# Patient Record
Sex: Female | Born: 1955 | Race: White | Hispanic: No | Marital: Married | Smoking: Never smoker
Health system: Southern US, Community
[De-identification: ages and names within clinical notes are randomized; demographics above are authoritative.]

## PROBLEM LIST (undated history)

## (undated) DIAGNOSIS — Z9889 Other specified postprocedural states: Secondary | ICD-10-CM

## (undated) DIAGNOSIS — H40009 Preglaucoma, unspecified, unspecified eye: Secondary | ICD-10-CM

## (undated) DIAGNOSIS — I341 Nonrheumatic mitral (valve) prolapse: Secondary | ICD-10-CM

## (undated) DIAGNOSIS — T7840XA Allergy, unspecified, initial encounter: Secondary | ICD-10-CM

## (undated) DIAGNOSIS — F329 Major depressive disorder, single episode, unspecified: Secondary | ICD-10-CM

## (undated) DIAGNOSIS — R51 Headache: Secondary | ICD-10-CM

## (undated) DIAGNOSIS — R519 Headache, unspecified: Secondary | ICD-10-CM

## (undated) DIAGNOSIS — T8859XA Other complications of anesthesia, initial encounter: Secondary | ICD-10-CM

## (undated) DIAGNOSIS — L409 Psoriasis, unspecified: Secondary | ICD-10-CM

## (undated) DIAGNOSIS — G479 Sleep disorder, unspecified: Secondary | ICD-10-CM

## (undated) DIAGNOSIS — Z8489 Family history of other specified conditions: Secondary | ICD-10-CM

## (undated) DIAGNOSIS — I10 Essential (primary) hypertension: Secondary | ICD-10-CM

## (undated) DIAGNOSIS — F32A Depression, unspecified: Secondary | ICD-10-CM

## (undated) DIAGNOSIS — M1990 Unspecified osteoarthritis, unspecified site: Secondary | ICD-10-CM

## (undated) DIAGNOSIS — R42 Dizziness and giddiness: Secondary | ICD-10-CM

## (undated) DIAGNOSIS — L570 Actinic keratosis: Secondary | ICD-10-CM

## (undated) DIAGNOSIS — R000 Tachycardia, unspecified: Secondary | ICD-10-CM

## (undated) DIAGNOSIS — K219 Gastro-esophageal reflux disease without esophagitis: Secondary | ICD-10-CM

## (undated) DIAGNOSIS — M8580 Other specified disorders of bone density and structure, unspecified site: Secondary | ICD-10-CM

## (undated) DIAGNOSIS — Z8719 Personal history of other diseases of the digestive system: Secondary | ICD-10-CM

## (undated) DIAGNOSIS — R112 Nausea with vomiting, unspecified: Secondary | ICD-10-CM

## (undated) DIAGNOSIS — E785 Hyperlipidemia, unspecified: Secondary | ICD-10-CM

## (undated) DIAGNOSIS — K760 Fatty (change of) liver, not elsewhere classified: Secondary | ICD-10-CM

## (undated) DIAGNOSIS — J189 Pneumonia, unspecified organism: Secondary | ICD-10-CM

## (undated) DIAGNOSIS — B029 Zoster without complications: Secondary | ICD-10-CM

## (undated) DIAGNOSIS — M797 Fibromyalgia: Secondary | ICD-10-CM

## (undated) DIAGNOSIS — T4145XA Adverse effect of unspecified anesthetic, initial encounter: Secondary | ICD-10-CM

## (undated) HISTORY — DX: Depression, unspecified: F32.A

## (undated) HISTORY — DX: Actinic keratosis: L57.0

## (undated) HISTORY — PX: COLONOSCOPY: SHX174

## (undated) HISTORY — DX: Psoriasis, unspecified: L40.9

## (undated) HISTORY — DX: Sleep disorder, unspecified: G47.9

## (undated) HISTORY — DX: Preglaucoma, unspecified, unspecified eye: H40.009

## (undated) HISTORY — PX: CARPAL TUNNEL RELEASE: SHX101

## (undated) HISTORY — DX: Tachycardia, unspecified: R00.0

## (undated) HISTORY — PX: SHOULDER SURGERY: SHX246

## (undated) HISTORY — PX: CARDIAC CATHETERIZATION: SHX172

## (undated) HISTORY — DX: Nonrheumatic mitral (valve) prolapse: I34.1

## (undated) HISTORY — DX: Major depressive disorder, single episode, unspecified: F32.9

## (undated) HISTORY — DX: Other specified disorders of bone density and structure, unspecified site: M85.80

## (undated) HISTORY — PX: BLADDER REPAIR: SHX76

## (undated) HISTORY — DX: Allergy, unspecified, initial encounter: T78.40XA

## (undated) HISTORY — PX: RECTAL PROLAPSE REPAIR: SHX759

## (undated) HISTORY — DX: Hyperlipidemia, unspecified: E78.5

## (undated) HISTORY — PX: UPPER GI ENDOSCOPY: SHX6162

## (undated) HISTORY — DX: Gastro-esophageal reflux disease without esophagitis: K21.9

## (undated) HISTORY — DX: Zoster without complications: B02.9

## (undated) HISTORY — DX: Essential (primary) hypertension: I10

## (undated) HISTORY — DX: Fibromyalgia: M79.7

## (undated) HISTORY — PX: JOINT REPLACEMENT: SHX530

---
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
## 1979-11-21 HISTORY — PX: AUGMENTATION MAMMAPLASTY: SUR837

## 1985-11-20 HISTORY — PX: BREAST ENHANCEMENT SURGERY: SHX7

## 1998-11-20 HISTORY — PX: ABDOMINAL HYSTERECTOMY: SHX81

## 2001-04-24 ENCOUNTER — Ambulatory Visit (HOSPITAL_COMMUNITY): Admission: RE | Admit: 2001-04-24 | Discharge: 2001-04-24 | Payer: Self-pay | Admitting: *Deleted

## 2004-09-20 ENCOUNTER — Encounter: Payer: Self-pay | Admitting: Internal Medicine

## 2005-03-18 ENCOUNTER — Observation Stay: Payer: Self-pay | Admitting: Specialist

## 2005-03-20 HISTORY — PX: NM MYOVIEW LTD: HXRAD82

## 2005-04-20 ENCOUNTER — Ambulatory Visit: Payer: Self-pay | Admitting: Endocrinology

## 2005-11-10 ENCOUNTER — Emergency Department: Payer: Self-pay | Admitting: Emergency Medicine

## 2005-12-15 ENCOUNTER — Other Ambulatory Visit: Payer: Self-pay

## 2005-12-15 ENCOUNTER — Emergency Department: Payer: Self-pay | Admitting: General Practice

## 2006-04-06 ENCOUNTER — Other Ambulatory Visit: Payer: Self-pay

## 2006-04-06 ENCOUNTER — Emergency Department: Payer: Self-pay | Admitting: Emergency Medicine

## 2006-04-27 ENCOUNTER — Ambulatory Visit: Payer: Self-pay | Admitting: Otolaryngology

## 2006-05-11 ENCOUNTER — Encounter: Payer: Self-pay | Admitting: Internal Medicine

## 2006-09-20 ENCOUNTER — Ambulatory Visit: Payer: Self-pay

## 2006-11-20 DIAGNOSIS — B029 Zoster without complications: Secondary | ICD-10-CM

## 2006-11-20 HISTORY — DX: Zoster without complications: B02.9

## 2006-12-18 ENCOUNTER — Ambulatory Visit: Payer: Self-pay | Admitting: Internal Medicine

## 2007-01-17 ENCOUNTER — Ambulatory Visit: Payer: Self-pay | Admitting: Internal Medicine

## 2007-02-18 ENCOUNTER — Ambulatory Visit: Payer: Self-pay | Admitting: Internal Medicine

## 2007-03-20 ENCOUNTER — Ambulatory Visit: Payer: Self-pay | Admitting: Internal Medicine

## 2007-03-20 DIAGNOSIS — J309 Allergic rhinitis, unspecified: Secondary | ICD-10-CM | POA: Insufficient documentation

## 2007-03-20 DIAGNOSIS — G479 Sleep disorder, unspecified: Secondary | ICD-10-CM | POA: Insufficient documentation

## 2007-03-20 DIAGNOSIS — J310 Chronic rhinitis: Secondary | ICD-10-CM | POA: Insufficient documentation

## 2007-03-20 DIAGNOSIS — K219 Gastro-esophageal reflux disease without esophagitis: Secondary | ICD-10-CM | POA: Insufficient documentation

## 2007-03-20 LAB — CONVERTED CEMR LAB
Albumin: 3.6 g/dL (ref 3.5–5.2)
BUN: 15 mg/dL (ref 6–23)
GFR calc non Af Amer: 81 mL/min
Phosphorus: 3.1 mg/dL (ref 2.3–4.6)
Potassium: 4.5 meq/L (ref 3.5–5.1)
Sodium: 139 meq/L (ref 135–145)

## 2007-03-25 ENCOUNTER — Telehealth (INDEPENDENT_AMBULATORY_CARE_PROVIDER_SITE_OTHER): Payer: Self-pay | Admitting: *Deleted

## 2007-04-06 ENCOUNTER — Emergency Department: Payer: Self-pay | Admitting: Emergency Medicine

## 2007-04-06 ENCOUNTER — Other Ambulatory Visit: Payer: Self-pay

## 2007-04-16 ENCOUNTER — Telehealth (INDEPENDENT_AMBULATORY_CARE_PROVIDER_SITE_OTHER): Payer: Self-pay | Admitting: *Deleted

## 2007-05-30 ENCOUNTER — Encounter: Payer: Self-pay | Admitting: Internal Medicine

## 2007-05-30 DIAGNOSIS — J45909 Unspecified asthma, uncomplicated: Secondary | ICD-10-CM | POA: Insufficient documentation

## 2007-05-30 DIAGNOSIS — I059 Rheumatic mitral valve disease, unspecified: Secondary | ICD-10-CM | POA: Insufficient documentation

## 2007-05-30 DIAGNOSIS — H409 Unspecified glaucoma: Secondary | ICD-10-CM | POA: Insufficient documentation

## 2007-05-30 DIAGNOSIS — M949 Disorder of cartilage, unspecified: Secondary | ICD-10-CM

## 2007-05-30 DIAGNOSIS — I10 Essential (primary) hypertension: Secondary | ICD-10-CM | POA: Insufficient documentation

## 2007-05-30 DIAGNOSIS — M899 Disorder of bone, unspecified: Secondary | ICD-10-CM | POA: Insufficient documentation

## 2007-06-06 ENCOUNTER — Ambulatory Visit: Payer: Self-pay | Admitting: Internal Medicine

## 2007-06-12 ENCOUNTER — Ambulatory Visit: Payer: Self-pay | Admitting: Internal Medicine

## 2007-06-12 DIAGNOSIS — N6019 Diffuse cystic mastopathy of unspecified breast: Secondary | ICD-10-CM | POA: Insufficient documentation

## 2007-06-26 ENCOUNTER — Encounter: Payer: Self-pay | Admitting: Internal Medicine

## 2007-07-01 ENCOUNTER — Encounter (INDEPENDENT_AMBULATORY_CARE_PROVIDER_SITE_OTHER): Payer: Self-pay | Admitting: *Deleted

## 2007-07-21 ENCOUNTER — Emergency Department: Payer: Self-pay | Admitting: Emergency Medicine

## 2007-09-23 ENCOUNTER — Ambulatory Visit: Payer: Self-pay | Admitting: Internal Medicine

## 2007-09-23 ENCOUNTER — Encounter (INDEPENDENT_AMBULATORY_CARE_PROVIDER_SITE_OTHER): Payer: Self-pay | Admitting: *Deleted

## 2007-10-25 ENCOUNTER — Telehealth: Payer: Self-pay | Admitting: Internal Medicine

## 2007-11-05 ENCOUNTER — Encounter: Payer: Self-pay | Admitting: Internal Medicine

## 2007-11-27 ENCOUNTER — Ambulatory Visit: Payer: Self-pay | Admitting: Internal Medicine

## 2008-02-19 ENCOUNTER — Ambulatory Visit: Payer: Self-pay | Admitting: Internal Medicine

## 2008-02-20 LAB — CONVERTED CEMR LAB
Albumin: 3.9 g/dL (ref 3.5–5.2)
CO2: 31 meq/L (ref 19–32)
Chloride: 104 meq/L (ref 96–112)
Creatinine, Ser: 0.8 mg/dL (ref 0.4–1.2)
GFR calc Af Amer: 98 mL/min
GFR calc non Af Amer: 81 mL/min
Glucose, Bld: 92 mg/dL (ref 70–99)
Sodium: 142 meq/L (ref 135–145)

## 2008-04-22 ENCOUNTER — Ambulatory Visit: Payer: Self-pay | Admitting: Internal Medicine

## 2008-05-01 ENCOUNTER — Encounter: Payer: Self-pay | Admitting: Internal Medicine

## 2008-08-14 ENCOUNTER — Ambulatory Visit: Payer: Self-pay | Admitting: Orthopedic Surgery

## 2008-08-28 ENCOUNTER — Ambulatory Visit: Payer: Self-pay | Admitting: Internal Medicine

## 2008-09-16 ENCOUNTER — Ambulatory Visit: Payer: Self-pay | Admitting: Orthopedic Surgery

## 2008-09-22 ENCOUNTER — Ambulatory Visit: Payer: Self-pay | Admitting: Orthopedic Surgery

## 2008-12-28 ENCOUNTER — Telehealth: Payer: Self-pay | Admitting: Internal Medicine

## 2009-01-27 ENCOUNTER — Ambulatory Visit: Payer: Self-pay | Admitting: Orthopedic Surgery

## 2009-02-02 ENCOUNTER — Ambulatory Visit: Payer: Self-pay | Admitting: Orthopedic Surgery

## 2009-02-23 ENCOUNTER — Ambulatory Visit: Payer: Self-pay | Admitting: Internal Medicine

## 2009-02-24 LAB — CONVERTED CEMR LAB
AST: 23 units/L (ref 0–37)
Albumin: 3.9 g/dL (ref 3.5–5.2)
Alkaline Phosphatase: 86 units/L (ref 39–117)
Basophils Absolute: 0 10*3/uL (ref 0.0–0.1)
Calcium: 9.7 mg/dL (ref 8.4–10.5)
Eosinophils Absolute: 0.1 10*3/uL (ref 0.0–0.7)
Eosinophils Relative: 1.5 % (ref 0.0–5.0)
GFR calc non Af Amer: 70.15 mL/min (ref >60)
Glucose, Bld: 99 mg/dL (ref 70–99)
HDL: 55.8 mg/dL (ref >39.00)
Monocytes Relative: 9.4 % (ref 3.0–12.0)
Neutro Abs: 5 10*3/uL (ref 1.4–7.7)
Neutrophils Relative %: 62 % (ref 43.0–77.0)
Platelets: 269 10*3/uL (ref 150.0–400.0)
RBC: 4.54 M/uL (ref 3.87–5.11)
Sodium: 141 meq/L (ref 135–145)
TSH: 0.73 microintl units/mL (ref 0.35–5.50)
Total Bilirubin: 0.6 mg/dL (ref 0.3–1.2)
Total CHOL/HDL Ratio: 3
VLDL: 21.8 mg/dL (ref 0.0–40.0)
WBC: 8 10*3/uL (ref 4.5–10.5)

## 2009-03-15 ENCOUNTER — Encounter: Payer: Self-pay | Admitting: Internal Medicine

## 2009-03-18 ENCOUNTER — Encounter: Payer: Self-pay | Admitting: Internal Medicine

## 2009-05-22 ENCOUNTER — Emergency Department: Payer: Self-pay | Admitting: Emergency Medicine

## 2009-06-01 ENCOUNTER — Ambulatory Visit: Payer: Self-pay | Admitting: Family Medicine

## 2009-06-03 ENCOUNTER — Telehealth: Payer: Self-pay | Admitting: Internal Medicine

## 2009-07-01 ENCOUNTER — Telehealth: Payer: Self-pay | Admitting: Internal Medicine

## 2009-08-24 ENCOUNTER — Encounter: Payer: Self-pay | Admitting: Internal Medicine

## 2009-09-09 ENCOUNTER — Ambulatory Visit: Payer: Self-pay | Admitting: Internal Medicine

## 2009-09-18 ENCOUNTER — Observation Stay: Payer: Self-pay | Admitting: Internal Medicine

## 2009-09-18 ENCOUNTER — Encounter: Payer: Self-pay | Admitting: Internal Medicine

## 2009-09-20 ENCOUNTER — Ambulatory Visit: Payer: Self-pay | Admitting: Obstetrics and Gynecology

## 2009-09-21 ENCOUNTER — Encounter: Payer: Self-pay | Admitting: Internal Medicine

## 2009-09-21 ENCOUNTER — Ambulatory Visit: Payer: Self-pay | Admitting: Unknown Physician Specialty

## 2009-09-24 ENCOUNTER — Ambulatory Visit: Payer: Self-pay | Admitting: Obstetrics and Gynecology

## 2009-10-18 ENCOUNTER — Encounter: Payer: Self-pay | Admitting: Internal Medicine

## 2009-10-29 ENCOUNTER — Encounter: Payer: Self-pay | Admitting: Internal Medicine

## 2009-11-10 ENCOUNTER — Ambulatory Visit: Payer: Self-pay

## 2009-11-20 HISTORY — PX: CERVICAL DISCECTOMY: SHX98

## 2009-12-16 ENCOUNTER — Ambulatory Visit (HOSPITAL_COMMUNITY): Admission: RE | Admit: 2009-12-16 | Discharge: 2009-12-20 | Payer: Self-pay | Admitting: Neurosurgery

## 2010-02-02 ENCOUNTER — Ambulatory Visit: Payer: Self-pay | Admitting: Internal Medicine

## 2010-02-23 ENCOUNTER — Encounter: Payer: Self-pay | Admitting: Internal Medicine

## 2010-02-25 ENCOUNTER — Ambulatory Visit: Payer: Self-pay | Admitting: Internal Medicine

## 2010-04-29 ENCOUNTER — Ambulatory Visit: Payer: Self-pay | Admitting: Internal Medicine

## 2010-06-01 ENCOUNTER — Encounter (INDEPENDENT_AMBULATORY_CARE_PROVIDER_SITE_OTHER): Payer: Self-pay | Admitting: *Deleted

## 2010-09-05 ENCOUNTER — Ambulatory Visit: Payer: Self-pay | Admitting: Internal Medicine

## 2010-09-05 ENCOUNTER — Encounter: Payer: Self-pay | Admitting: Internal Medicine

## 2010-09-07 LAB — CONVERTED CEMR LAB
AST: 45 units/L — ABNORMAL HIGH (ref 0–37)
Alkaline Phosphatase: 103 units/L (ref 39–117)
BUN: 16 mg/dL (ref 6–23)
Basophils Relative: 0.4 % (ref 0.0–3.0)
Bilirubin, Direct: 0.1 mg/dL (ref 0.0–0.3)
CO2: 30 meq/L (ref 19–32)
Calcium: 10 mg/dL (ref 8.4–10.5)
Cholesterol: 217 mg/dL — ABNORMAL HIGH (ref 0–200)
Eosinophils Relative: 1.7 % (ref 0.0–5.0)
GFR calc non Af Amer: 70.63 mL/min (ref >60)
HCT: 40.8 % (ref 36.0–46.0)
Hemoglobin: 14.2 g/dL (ref 12.0–15.0)
Lymphs Abs: 2.2 10*3/uL (ref 0.7–4.0)
Monocytes Relative: 10.5 % (ref 3.0–12.0)
Neutro Abs: 4.1 10*3/uL (ref 1.4–7.7)
RBC: 4.59 M/uL (ref 3.87–5.11)
RDW: 12.9 % (ref 11.5–14.6)
TSH: 0.72 microintl units/mL (ref 0.35–5.50)
Total Protein: 7.2 g/dL (ref 6.0–8.3)
Triglycerides: 162 mg/dL — ABNORMAL HIGH (ref 0.0–149.0)

## 2010-10-01 ENCOUNTER — Ambulatory Visit: Payer: Self-pay | Admitting: Family Medicine

## 2010-10-09 ENCOUNTER — Ambulatory Visit: Payer: Self-pay | Admitting: Internal Medicine

## 2010-10-12 ENCOUNTER — Ambulatory Visit: Payer: Self-pay | Admitting: Family Medicine

## 2010-12-18 LAB — CONVERTED CEMR LAB
ALT: 18 units/L (ref 0–35)
ANA Titer 1: 1:640 {titer} — ABNORMAL HIGH
Alkaline Phosphatase: 59 units/L (ref 39–117)
Anti Nuclear Antibody(ANA): POSITIVE — AB
BUN: 17 mg/dL (ref 6–23)
Basophils Absolute: 0 10*3/uL (ref 0.0–0.1)
Basophils Absolute: 0 10*3/uL (ref 0.0–0.1)
Bilirubin, Direct: 0.1 mg/dL (ref 0.0–0.3)
CO2: 28 meq/L (ref 19–32)
Calcium: 9.4 mg/dL (ref 8.4–10.5)
Chloride: 104 meq/L (ref 96–112)
Creatinine, Ser: 0.9 mg/dL (ref 0.4–1.2)
Direct LDL: 122.7 mg/dL
Eosinophils Absolute: 0.1 10*3/uL (ref 0.0–0.6)
Eosinophils Absolute: 0.1 10*3/uL (ref 0.0–0.6)
Eosinophils Relative: 1.4 % (ref 0.0–5.0)
GFR calc Af Amer: 115 mL/min
GFR calc Af Amer: 86 mL/min
Glucose, Bld: 81 mg/dL (ref 70–99)
Glucose, Bld: 91 mg/dL (ref 70–99)
HCT: 36.1 % (ref 36.0–46.0)
HCT: 37.1 % (ref 36.0–46.0)
HDL: 62.9 mg/dL (ref >39.0)
Hemoglobin: 13.3 g/dL (ref 12.0–15.0)
Lymphocytes Relative: 29.1 % (ref 12.0–46.0)
MCHC: 34.5 g/dL (ref 30.0–36.0)
MCHC: 35.9 g/dL (ref 30.0–36.0)
MCV: 86.6 fL (ref 78.0–100.0)
Monocytes Absolute: 0.7 10*3/uL (ref 0.2–0.7)
Neutro Abs: 4.3 10*3/uL (ref 1.4–7.7)
Neutrophils Relative %: 52.5 % (ref 43.0–77.0)
Neutrophils Relative %: 60.5 % (ref 43.0–77.0)
Platelets: 268 10*3/uL (ref 150–400)
Potassium: 3.9 meq/L (ref 3.5–5.1)
Potassium: 4.4 meq/L (ref 3.5–5.1)
RBC: 4.17 M/uL (ref 3.87–5.11)
RDW: 12.7 % (ref 11.5–14.6)
Rhuematoid fact SerPl-aCnc: <20 intl units/mL — ABNORMAL LOW (ref 0.0–20.0)
Sodium: 139 meq/L (ref 135–145)
TSH: 0.7 microintl units/mL (ref 0.35–5.50)
TSH: 0.88 microintl units/mL (ref 0.35–5.50)
Total CHOL/HDL Ratio: 3.4
Total Protein: 6.9 g/dL (ref 6.0–8.3)
Triglycerides: 165 mg/dL — ABNORMAL HIGH (ref 0–149)
WBC: 5.5 10*3/uL (ref 4.5–10.5)

## 2010-12-22 NOTE — Assessment & Plan Note (Signed)
Summary: CONGESTION,COUGH AND TIGHTNESS IN CHEST/EVM   Vital Signs:  Patient profile:   56 year old female Height:      64 inches Weight:      172 pounds O2 Sat:      98 % on Room air Temp:     98.1 degrees F oral Pulse rate:   83 / minute Pulse rhythm:   regular Resp:     16 per minute BP sitting:   132 / 86  (left arm)  Vitals Entered By: Ma Hillock LPN CC: Cough and congestion slowly worse last few days as sinus symptoms improved. Is Patient Diabetic? No Pain Assessment Patient in pain? yes     Location: head, chest Intensity: 4 Type: dull, tightness Onset of pain  Constant  Does patient need assistance? Functional Status Self care Ambulation Normal   CC:  Cough and congestion slowly worse last few days as sinus symptoms improved.Marland Kitchen  History of Present Illness: patient here 10/01/10 for dx sinusitis and rx amox 875, flonase, cont asthma meds and mucinex d. She had just previously had 10 days of cipro.  worse symptoms now is yellow productive cough, dyspnea on exertion, perhaps unmeasured low grade fever. patient  denies chest pain, chills, myalgias, sob at rest, wheezing severe enough to take her as needed albuterol. patient had flu shot this year Dr. Alphonsus Sias. patient with hx allergies but not taking any antihistamine. Has bottle of tussionex but hasn't needed it. She can take zpak.  Problems Prior to Update: 1)  Acute Sinusitis, Unspecified  (ICD-461.9) 2)  Palpitations  (ICD-785.1) 3)  Actinic Keratosis  (ICD-702.0) 4)  Depression  (ICD-311) 5)  Toe Injury, Traumatic Nail Excision  (ICD-959.7) 6)  Special Screening For Malignant Neoplasms Colon  (ICD-V76.51) 7)  Leg Pain, Right  (ICD-729.5) 8)  Chronic Rhinitis  (ICD-472.0) 9)  Preventive Health Care  (ICD-V70.0) 10)  Chest Pain  (ICD-786.50) 11)  Back, Lower, Pain  (ICD-724.2) 12)  Fibrocystic Breast Disease  (ICD-610.1) 13)  Glaucoma  (ICD-365.9) 14)  Osteopenia  (ICD-733.90) 15)  Mitral Valve Prolapse   (ICD-424.0) 16)  Allergic Rhinitis  (ICD-477.9) 17)  Sleep Disorder  (ICD-780.50) 18)  Hypertension  (ICD-401.9) 19)  Gerd  (ICD-530.81) 20)  Asthma  (ICD-493.90)  Medications Prior to Update: 1)  Advair Diskus 100-50 Mcg/dose Misc (Fluticasone-Salmeterol) .Marland Kitchen.. 1 Inhalation Once A Day 2)  Protonix 40 Mg Tbec (Pantoprazole Sodium) .... Take 1 By Mouth Once Daily 3)  Trazodone Hcl 100 Mg Tabs (Trazodone Hcl) .... Take 2 By Mouth At Bedtime 4)  Triamterene-Hctz 37.5-25 Mg Tabs (Triamterene-Hctz) .... Take One By Mouth Daily 5)  Lumigan 0.03 %  Soln (Bimatoprost) .... As Directed 6)  Metoprolol Succinate 25 Mg Xr24h-Tab (Metoprolol Succinate) .... 1/2 Tab Daily As Directed 7)  Amoxicillin 875 Mg Tabs (Amoxicillin) .... Take 1 By Mouth Two Times A Day Until Completed 8)  Fluticasone Propionate 50 Mcg/act Susp (Fluticasone Propionate) .... 2 Sprays Per Nostril Once Daily- Use in Place of The Nasocort  Allergies: 1)  ! Macrobid (Nitrofurantoin Monohyd Macro) 2)  ! Percocet (Oxycodone-Acetaminophen) 3)  ! Biaxin 4)  ! * Cat and Dog 5)  ! * Dust 6)  Lisinopril (Lisinopril)  Past History:  Past Medical History: Last updated: 10/01/2010 Asthma GERD Hypertension Osteopenia Mitral valve prolapse Glaucoma suspect Sleep disturbance Allergic Rhinitis Depression  Past Surgical History: Last updated: 02/02/2010 1/08     Shingles 2000    Hysterectomy/BSO - abn. Paps 1978    Appendix  1987    Breast augmentation Since hysterectomy   - Bladder tack x 2 and rectal repair x 1 2002    Cardiac Cath - okay 6/07     DEXA - boderline (femoral neck  1-2) 5/06     Myoview Stress - EF 56%, no ischemia 11/07/23/09  Right shoulder surgery, then redone with lysis of adhesions 10/10  Admitted with chest pain---myoview stress was normal 1/11 Cervical diskectomy with synthetic discs inserted--  Dr Lovell Sheehan  Family History: Last updated: 05/30/2007 Dad ded @70  lung cancer, CAD Mom has DM, CVA, HTN,  ovarian cancer, melanoma 1 sister--CAD, DM, dperession HTN strong in family RA in maternal aunt Breast cancer in [place] DM in sister, Mom, grandparents and great grandparents No colon cancer  Social History: Last updated: 05/30/2007 Married--5 children Never Smoked Alcohol use-no 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 Eyes:  Denies discharge, eye irritation, and eye pain. ENT:  Complains of hoarseness, sinus pressure, and sore throat. CV:  Denies chest pain or discomfort, fainting, near fainting, swelling of feet, and swelling of hands. Resp:  Complains of chest discomfort, cough, sputum productive, and wheezing; Asthma. GI:  Denies abdominal pain, bloody stools, constipation, diarrhea, indigestion, nausea, and vomiting. GU:  Denies abnormal vaginal bleeding, discharge, and dysuria. MS:  Denies joint pain, joint redness, joint swelling, loss of strength, muscle aches, muscle weakness, and stiffness. Derm:  Denies changes in color of skin, changes in nail beds, hair loss, and rash. Neuro:  Complains of headaches; denies brief paralysis, numbness, tingling, tremors, and weakness; mild bitemporal not like usual migraines or previous sinus pain. Psych:  Denies anxiety, depression, and easily angered. Heme:  Denies abnormal bruising.  Other ROS:  General: Denies fever, chills, sweats, anorexia, fatigue, weakness, malaise, weight loss, and sleep disorder.   Physical Exam  General:  Well-developed,well-nourished,in no acute distress; alert,appropriate and cooperative throughout examination Head:  Normocephalic and atraumatic without obvious abnormalities. Eyes:  No corneal or conjunctival inflammation noted. EOMI.  Perrla. Ears:  External ear exam shows no significant lesions or deformities.  Otoscopic examination reveals clear canals, tympanic membranes are intact bilaterally without bulging, retraction, inflammation or discharge. Hearing is grossly normal bilaterally. Nose:  External nasal examination shows no deformity or inflammation. Nasal mucosa are pink and moist without lesions or exudates. Mouth:  Oral mucosa and oropharynx without lesions or exudates Neck:  No deformities, masses, or tenderness noted. Lungs:  Normal respiratory effort, chest expands symmetrically. Lungs are clear to auscultation, no crackles or wheezes. Msk:    Extremities:  No clubbing, cyanosis, edema,  Neurologic:  No cranial nerve deficits noted. Station and gait are normal. motor and coordinative functions appear intact. Cervical Nodes:  No lymphadenopathy noted Psych:  Cognition and judgment appear intact. Alert and cooperative with normal attention span and concentration. No apparent delusions, illusions, hallucinations   Problems:  Medical Problems Added: 1)  Dx of Bronchitis-acute  (ICD-466.0)  Impression & Recommendations:  Problem # 1:  ACUTE SINUSITIS, UNSPECIFIED (ICD-461.9) Assessment Improved  Her updated medication list for this problem includes:    Amoxicillin 875 Mg Tabs (Amoxicillin) .Marland Kitchen... Take 1 by mouth two times a day until completed    Fluticasone Propionate 50 Mcg/act Susp (Fluticasone propionate) .Marland Kitchen... 2 sprays per nostril once daily- use in  place of the nasocort    Zithromax Z-pak 250 Mg Tabs (Azithromycin) .Marland Kitchen... 2 by mouth then 1 by mouth qd  Problem # 2:  ASTHMA (ICD-493.90) Assessment: Unchanged  Her updated medication list for this problem includes:    Advair Diskus 100-50 Mcg/dose Misc (Fluticasone-salmeterol) .Marland Kitchen... 1 inhalation once a day  Complete Medication List: 1)  Advair Diskus 100-50 Mcg/dose Misc (Fluticasone-salmeterol) .Marland Kitchen.. 1 inhalation once a day 2)  Protonix 40 Mg Tbec  (Pantoprazole sodium) .... Take 1 by mouth once daily 3)  Trazodone Hcl 100 Mg Tabs (Trazodone hcl) .... Take 2 by mouth at bedtime 4)  Triamterene-hctz 37.5-25 Mg Tabs (Triamterene-hctz) .... Take one by mouth daily 5)  Lumigan 0.03 % Soln (Bimatoprost) .... As directed 6)  Metoprolol Succinate 25 Mg Xr24h-tab (Metoprolol succinate) .... 1/2 tab daily as directed 7)  Amoxicillin 875 Mg Tabs (Amoxicillin) .... Take 1 by mouth two times a day until completed 8)  Fluticasone Propionate 50 Mcg/act Susp (Fluticasone propionate) .... 2 sprays per nostril once daily- use in place of the nasocort 9)  Zithromax Z-pak 250 Mg Tabs (Azithromycin) .... 2 by mouth then 1 by mouth qd  Patient Instructions: 1)  Take 650-1000mg  of Tylenol every 4-6 hours as needed for relief of pain or comfort of fever AVOID taking more than 4000mg   in a 24 hour period (can cause liver damage in higher doses). 2)  Oral Rehydration Solution: drink 1/2 ounce every 15 minutes. If tolerated afert 1 hour, drink 1 ounce every 15 minutes. As you can tolerate, keep adding 1/2 ounce every 15 minutes, up to a total of 2-4 ounces. Contact the office if unable to tolerate oral solution, if you keep vomiting, or you continue to have signs of dehydration. 3)  Please schedule an appointment with your primary doctor in :1 week. 4)  Continue present meds and consider using your albuterol if wheezy. 5)  consider restarting claritin. 6)  use robitussin dm for mild cough suppression and if severe cough use you own tussionex. Prescriptions: ZITHROMAX Z-PAK 250 MG TABS (AZITHROMYCIN) 2 by mouth then 1 by mouth qd  #1 x 0   Entered and Authorized by:   J. Juline Patch MD   Signed by:   Shela Commons. Juline Patch MD on 10/09/2010   Method used:   Electronically to        [facility] [address] (retail)       [address]       [place], [place]  [address]       Ph: [phone]       Fax: [phone]   RxID:    [id number]

## 2010-12-22 NOTE — Assessment & Plan Note (Signed)
Summary: PALPITATIONS,FLU SHOT/CLE   Vital Signs:  Patient profile:   55 year old female Height:      67 inches Weight:      172.0 pounds BMI:     27.04 Temp:     98.9 degrees F oral Pulse rate:   76 / minute Pulse rhythm:   regular BP sitting:   110 / 80  (left arm) Cuff size:   regular  Vitals Entered By: Mervin Hack CMA Duncan Dull) (September 05, 2010 11:26 AM) CC: palpatations   History of Present Illness: Had some palpitations these come and go Has had low potassium in past and wonders if that is the reason tries to increase bananas, etc May occur every few weeks to couple of months more at night No chest pain No SOB Not during exertion Feels skips chronic fast heart beat in general  Stopped the citalopram completely weaned off in July No problems with mood    Allergies: 1)  ! Macrobid (Nitrofurantoin Monohyd Macro) 2)  ! Percocet (Oxycodone-Acetaminophen) 3)  Lisinopril (Lisinopril)  Past History:  Past medical, surgical, family and social histories (including risk factors) reviewed for relevance to current acute and chronic problems.  Past Medical History: Reviewed history from 02/02/2010 and no changes required. Asthma GERD Hypertension Osteopenia Mitral valve prolapse Glaucoma suspect Sleep disturbance Allergies Depression  Past Surgical History: Reviewed history from 02/02/2010 and no changes required. 1/08     Shingles 2000    Hysterectomy/BSO - abn. Paps 1978    Appendix 1987    Breast augmentation Since hysterectomy   - Bladder tack x 2 and rectal repair x 1 2002    Cardiac Cath - okay 6/07     DEXA - boderline (femoral neck  1-2) 5/06     Myoview Stress - EF 56%, no ischemia 11/07/23/09  Right shoulder surgery, then redone with lysis of adhesions 10/10  Admitted with chest pain---myoview stress was normal 1/11 Cervical diskectomy with synthetic discs inserted--  Dr Lovell Sheehan  Family History: Reviewed history from 05/30/2007 and no  changes required. Dad ded @70  lung cancer, CAD Mom has DM, CVA, HTN, ovarian cancer, melanoma 1 sister--CAD, DM, dperession HTN strong in family RA in maternal aunt Breast cancer in [place] DM in sister, Mom, grandparents and great grandparents No colon cancer  Social History: Reviewed history from 05/30/2007 and no changes required. Married--5 children Never Smoked Alcohol use-no Home maker Doesn't exercise  Cordelia Pen Talley's sister  Review of Systems       No sig caffeine--2 cups of coffee in Am only sleeps okay Needs the diuretic due to recurrent hand swelling  Physical Exam  General:  alert and normal appearance.   Neck:  supple, no masses, no thyromegaly, no carotid bruits, and no cervical lymphadenopathy.   Lungs:  normal respiratory effort, no intercostal retractions, no accessory muscle use, and normal breath sounds.   Heart:  normal rate, regular rhythm, no murmur, and no gallop.   Abdomen:  soft and non-tender.   Pulses:  1+ in feet Extremities:  no edema Psych:  normally interactive, good eye contact, not anxious appearing, and not depressed appearing.     Impression & Recommendations:  Problem # 1:  PALPITATIONS (ICD-785.1) Assessment New  doesn't sound pathologic but has been bothersome to her at times will try low dose metoprolol she will call if she has persistent symptoms or dizziness, etc  Her updated medication list for this problem includes:    Metoprolol Succinate 25 Mg Xr24h-tab (  Metoprolol succinate) .Marland Kitchen... 1/2 tab daily as directed  Orders: EKG w/ Interpretation (93000) TLB-Lipid Panel (80061-LIPID) TLB-Renal Function Panel (80069-RENAL) TLB-CBC Platelet - w/Differential (85025-CBCD) TLB-Hepatic/Liver Function Pnl (80076-HEPATIC) TLB-TSH (Thyroid Stimulating Hormone) (84443-TSH) Venipuncture (47829)  Problem # 2:  HYPERTENSION (ICD-401.9) Assessment: Unchanged good control needs the diuretic for chronic hand swelling asked her to cut  out all salt  Her updated medication list for this problem includes:    Triamterene-hctz 37.5-25 Mg Tabs (Triamterene-hctz) .Marland Kitchen... Take one by mouth daily    Metoprolol Succinate 25 Mg Xr24h-tab (Metoprolol succinate) .Marland Kitchen... 1/2 tab daily as directed  BP today: 110/80 Prior BP: 110/68 (04/29/2010)  Labs Reviewed: K+: 4.0 (02/23/2009) Creat: : 0.9 (02/23/2009)   Chol: 194 (02/23/2009)   HDL: 55.80 (02/23/2009)   LDL: 116 (02/23/2009)   TG: 109.0 (02/23/2009)  Complete Medication List: 1)  Advair Diskus 100-50 Mcg/dose Misc (Fluticasone-salmeterol) .Marland Kitchen.. 1 inhalation once a day 2)  Protonix 40 Mg Tbec (Pantoprazole sodium) .... Take 1 by mouth once daily 3)  Trazodone Hcl 100 Mg Tabs (Trazodone hcl) .... Take 2 by mouth at bedtime 4)  Triamterene-hctz 37.5-25 Mg Tabs (Triamterene-hctz) .... Take one by mouth daily 5)  Nasacort Aq 55 Mcg/act Aers (Triamcinolone acetonide(nasal)) .... 2 sprays each nostril daily as directed 6)  Lumigan 0.03 % Soln (Bimatoprost) .... As directed 7)  Metoprolol Succinate 25 Mg Xr24h-tab (Metoprolol succinate) .... 1/2 tab daily as directed  Other Orders: Admin 1st Vaccine (56213) Flu Vaccine 41yrs + [id number])  Patient Instructions: 1)  Please start the new medication, metoprolol, for the palpitations 2)  Please stop all salt and use salt substitute instead 3)  If your hand swelling is better, it is okay to try taking the fluid pill only as needed or cutting them in half 4)  Please cancel the December visit----schedule physical in 4-6 months Prescriptions: METOPROLOL SUCCINATE 25 MG XR24H-TAB (METOPROLOL SUCCINATE) 1/2 tab daily as directed  #30 x 5   Entered and Authorized by:   Cindee Salt MD   Signed by:   Cindee Salt MD on 09/05/2010   Method used:   Electronically to        [facility] (retail)       [address]       [place], [place]  [address]       Ph: [phone]       Fax:  [phone]   RxID:   [id number]    Orders Added: 1)  Admin 1st Vaccine [90471] 2)  Flu Vaccine 63yrs + [56433] 3)  Est. Patient Level IV [29518] 4)  EKG w/ Interpretation [93000] 5)  TLB-Lipid Panel [80061-LIPID] 6)  TLB-Renal Function Panel [80069-RENAL] 7)  TLB-CBC Platelet - w/Differential [85025-CBCD] 8)  TLB-Hepatic/Liver Function Pnl [80076-HEPATIC] 9)  TLB-TSH (Thyroid Stimulating Hormone) [84443-TSH] 10)  Venipuncture [84166]    Current Allergies (reviewed today): ! MACROBID (NITROFURANTOIN MONOHYD MACRO) ! PERCOCET (OXYCODONE-ACETAMINOPHEN) LISINOPRIL (LISINOPRIL)              Flu Vaccine Consent Questions     Do you have a history of severe allergic reactions to this vaccine? no    Any prior history of allergic reactions to egg and/or gelatin? no    Do you have a sensitivity to the preservative Thimersol? no    Do you have a past history of Guillan-Barre Syndrome? no    Do you currently have an acute febrile illness?  no    Have you ever had a severe reaction to latex? no    Vaccine information given and explained to patient? yes    Are you currently pregnant? no    Lot Number:[id number]   Exp Date:05/20/2011   Site Given  Left Deltoid IM   .lbflu

## 2010-12-22 NOTE — Assessment & Plan Note (Signed)
Summary: pneumonia/been to walk in x 3/dlo   Vital Signs:  Patient profile:   55 year old female Weight:      172.50 pounds O2 Sat:      96 % on Room air Temp:     98.9 degrees F oral Pulse rate:   92 / minute Pulse rhythm:   regular BP sitting:   118 / 76  (left arm) Cuff size:   regular  Vitals Entered By: Sydell Axon LPN (October 12, 2010 11:55 AM)  O2 Flow:  Room air CC: ? Pneumonia, has been seen at [facility] X 2, chest pain, productive cough/brown, headache, fever and no energy   History of Present Illness: "I have been sick for over a month and I feel worse now than I did at the beginning." She has been seen at [facility] acute care 10/25, then at [facility] thru [facility] Sat a week ago and this past Sun. She was put on Omnicef initially for sinus infection, then Amox for sinus infection then Zithromax for chest congestion. She was coughing up "chunks of stuff" the last visit with no fever  but now is coughing up brown chunks with highest now 100. something and 99.6 this AM. She has headache in the right frontal area, "fever" as above, coughing shooyts pain through the head. She has no ear pain, minimal rhinitis, basically congested with minimal clear discharge, some ST that started in the last 2 days, and significant cough with brown discharge worse in the AM.  She was taking Mucinex D.  She has also had some diarrhea, poss from the Abs. She was given Zithromax Sun, last pill to be taken today. She is having ma hard time keeping going. She just generally feels sick and has SOB with activity. She also has nausea frequently. She uses a coool mist humidifier. 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 She has not done that for the last 11/2 weeks.  Allergies: 1)  ! Macrobid (Nitrofurantoin Monohyd Macro) 2)  ! Percocet (Oxycodone-Acetaminophen) 3)  ! Biaxin 4)  ! * Cat and Dog 5)  ! * Dust 6)  Lisinopril (Lisinopril)  Physical Exam  General:   Well-developed,well-nourished,in no acute distress; alert,appropriate and cooperative throughout examination, looks tired and is generally slightly congested. Head:  Normocephalic and atraumatic without obvious abnormalities. Sinuses mildly tender in max distrib. Eyes:  Mild conjunctival irritation. Ears:  External ear exam shows no significant lesions or deformities.  Otoscopic examination reveals clear canals, tympanic membranes are intact bilaterally without bulging, retraction, inflammation or discharge. Hearing is grossly normal bilaterally. Nose:  External nasal examination shows no deformity or inflammation. Nasal mucosa are pink and moist without lesions or exudates. Left nare swollen shut, right slightly inflamed. Mouth:  Oral mucosa and oropharynx without lesions or exudates, mild erythema. Neck:  No deformities, masses, or tenderness noted. Lungs:  Normal respiratory effort, chest expands symmetrically. Lungs are clear to auscultation, no crackles or wheezes. Heart:  normal rate, regular rhythm, no murmur, and no gallop.   Cervical Nodes:  No lymphadenopathy noted   Impression & Recommendations:  Problem # 1:  BRONCHITIS-ACUTE (ICD-466.0) Assessment Unchanged  Poss slightly worse. Congestion needs improvement. See instructions. Her updated medication list for this problem includes:    Advair Diskus 100-50 Mcg/dose Misc (Fluticasone-salmeterol) .Marland Kitchen... 1 inhalation once a day    Zithromax Z-pak 250 Mg Tabs (Azithromycin) .Marland Kitchen... 2 by mouth then 1 by mouth qd    Tussionex Pennkinetic Er 10-8 Mg/33ml  Lqcr (Hydrocod polst-chlorphen polst) ..... One tsp by mouth at night    Tessalon Perles 100 Mg Caps (Benzonatate) ..... One tab by mouth three times a day as needed cough  Take antibiotics and other medications as directed. Encouraged to push clear liquids, get enough rest, and take acetaminophen as needed. To be seen in 5-7 days if no improvement, sooner if worse.  Complete Medication  List: 1)  Advair Diskus 100-50 Mcg/dose Misc (Fluticasone-salmeterol) .Marland Kitchen.. 1 inhalation once a day 2)  Protonix 40 Mg Tbec (Pantoprazole sodium) .... Take 1 by mouth once daily 3)  Trazodone Hcl 100 Mg Tabs (Trazodone hcl) .... Take 2 by mouth at bedtime 4)  Triamterene-hctz 37.5-25 Mg Tabs (Triamterene-hctz) .... Take one by mouth daily 5)  Lumigan 0.03 % Soln (Bimatoprost) .... As directed 6)  Metoprolol Succinate 25 Mg Xr24h-tab (Metoprolol succinate) .... 1/2 tab daily as directed 7)  Fluticasone Propionate 50 Mcg/act Susp (Fluticasone propionate) .... 2 sprays per nostril once daily- use in place of the nasocort 8)  Zithromax Z-pak 250 Mg Tabs (Azithromycin) .... 2 by mouth then 1 by mouth qd 9)  Tussionex Pennkinetic Er 10-8 Mg/68ml Lqcr (Hydrocod polst-chlorphen polst) .... One tsp by mouth at night 10)  Tessalon Perles 100 Mg Caps (Benzonatate) .... One tab by mouth three times a day as needed cough  Patient Instructions: 1)  Take Guaifenesin by going to [facility] or [facility] and getting MUCOUS RELIEF EXPECTORANT (400mg ), take 11/2 tabs by mouth AM and NOON. 2)  Drink lots of fluids anytime taking Guaifenesin.  3)  Take Tyl ES 2 tabs by mouth three times a day  4)  Drink lots of fluids. 5)  Take Tussionex at night. 6)  If production with cough stops, may take Tessalon for cough as needed, up to three times a day. Prescriptions: TESSALON PERLES 100 MG CAPS (BENZONATATE) one tab by mouth three times a day as needed cough  #30 x 0   Entered and Authorized by:   Shaune Leeks MD   Signed by:   Shaune Leeks MD on 10/12/2010   Method used:   Print then Give to Patient   RxID:   [id number] Sandria Senter ER 10-8 MG/5ML LQCR (HYDROCOD POLST-CHLORPHEN POLST) one tsp by mouth at night  #8 oz x 0   Entered and Authorized by:   Shaune Leeks MD   Signed by:   Shaune Leeks MD on 10/12/2010   Method used:   Print then Give to Patient    RxID:   [id number]      Current Allergies (reviewed today): ! MACROBID (NITROFURANTOIN MONOHYD MACRO) ! PERCOCET (OXYCODONE-ACETAMINOPHEN) ! BIAXIN ! * CAT AND DOG ! * DUST LISINOPRIL (LISINOPRIL)

## 2010-12-22 NOTE — Assessment & Plan Note (Signed)
Summary: SINUS,CONGESTION/JBB   Vital Signs:  Patient Profile:   55 Years Old Female CC:      sinus pressure, headache, cough Height:     64 inches (163.19 cm) Weight:      173 pounds BMI:     29.80 O2 Sat:      96 % O2 treatment:    Room Air Pulse rate:   94 / minute Resp:     16 per minute BP sitting:   137 / 87  (left arm)  Pt. in pain?   yes    Location:   sinus  Vitals Entered By: Adella Hare LPN (October 01, 2010 5:20 PM)                   Current Allergies: ! MACROBID (NITROFURANTOIN MONOHYD MACRO) ! PERCOCET (OXYCODONE-ACETAMINOPHEN) ! BIAXIN ! * CAT AND DOG ! * DUST LISINOPRIL (LISINOPRIL)History of Present Illness History from: patient Reason for visit: see chief complaint Chief Complaint: sinus pressure, headache, cough History of Present Illness: the patient is presenting today as a new patient to our clinic. She reports that she was having a difficult time with a sinus infection that doesn't seem to resolve over the last 3 weeks. She reports that she saw her physician and was given a prescription for ciprofloxacin. She took that for 10 days but no improvement in the symptoms of a sinus infection. She reports that she's having a lot of sinus pressure and pain and drainage. She's having dental pain and a poor taste in the mouth. She reports that she has never been prescribed Cipro before but it doesn't seem to help her sinus infection. She also has been taking Mucinex D with only minimal relief in symptoms. She has a history of allergic rhinitis. She reports that she's been having fatigue and no fever. No chills reported no diarrhea nausea or vomiting. She is having a headache related to her sinuses.  REVIEW OF SYSTEMS Constitutional Symptoms       Complains of fatigue.     Denies fever, chills, night sweats, weight loss, and weight gain.  Eyes       Denies change in vision, eye pain, eye discharge, glasses, contact lenses, and eye  surgery. Ear/Nose/Throat/Mouth       Complains of sinus problems.      Denies hearing loss/aids, change in hearing, ear pain, ear discharge, dizziness, frequent runny nose, frequent nose bleeds, sore throat, hoarseness, and tooth pain or bleeding.  Respiratory       Complains of dry cough, shortness of breath, and asthma.      Denies productive cough, wheezing, bronchitis, and emphysema/COPD.  Cardiovascular       Complains of murmurs.      Denies chest pain and tires easily with exhertion.    Gastrointestinal       Denies stomach pain, nausea/vomiting, diarrhea, constipation, blood in bowel movements, and indigestion. Genitourniary       Denies painful urination, kidney stones, and loss of urinary control. Neurological       Complains of headaches.      Denies paralysis, seizures, and fainting/blackouts. Musculoskeletal       Denies muscle pain, joint pain, joint stiffness, decreased range of motion, redness, swelling, muscle weakness, and gout.  Skin       Denies bruising, unusual mles/lumps or sores, and hair/skin or nail changes.  Psych       Denies mood changes, temper/anger issues, anxiety/stress, speech problems, depression, and sleep  problems.  Past History:  Past Surgical History: Last updated: 02/02/2010 1/08     Shingles 2000    Hysterectomy/BSO - abn. Paps 1978    Appendix 1987    Breast augmentation Since hysterectomy   - Bladder tack x 2 and rectal repair x 1 2002    Cardiac Cath - okay 6/07     DEXA - boderline (femoral neck  1-2) 5/06     Myoview Stress - EF 56%, no ischemia 11/07/23/09  Right shoulder surgery, then redone with lysis of adhesions 10/10  Admitted with chest pain---myoview stress was normal 1/11 Cervical diskectomy with synthetic discs inserted--  Dr Lovell Sheehan  Family History: Last updated: 05/30/2007 Dad ded @70  lung cancer, CAD Mom has DM, CVA, HTN, ovarian cancer, melanoma 1 sister--CAD, DM, dperession HTN strong in family RA in maternal  aunt Breast cancer in [place] DM in sister, Mom, grandparents and great grandparents No colon cancer  Social History: Last updated: 05/30/2007 Married--5 children Never Smoked Alcohol use-no 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 Dad ded @70  lung cancer, CAD Mom has DM, CVA, HTN, ovarian cancer, melanoma 1 sister--CAD, DM, dperession HTN strong in family RA in maternal aunt Breast cancer in [place] DM in sister, Mom, grandparents and great grandparents No colon cancer  Social History: Reviewed history from 05/30/2007 and no changes required. Married--5 children Never Smoked Alcohol use-no Home maker Doesn't exercise  Skipper Cliche sister Physical Exam General appearance: well developed, well nourished, no acute distress Head: normocephalic, atraumatic Eyes: conjunctivae and lids normal Pupils: equal, round, reactive to light Ears: normal, no lesions or deformities Nasal: marked sinus and nasal congestion Oral/Pharynx: pharyngeal erythema without exudate, uvula midline without deviation Neck: neck supple,  trachea midline, no masses Chest/Lungs: no rales, wheezes, or rhonchi bilateral, breath sounds equal without effort Heart: regular rate and  rhythm, no murmur Abdomen: soft, non-tender without obvious organomegaly Extremities: normal extremities Neurological: grossly intact and non-focal Skin: no obvious rashes or lesions MSE: oriented to time, place, and person Assessment Additional workup planned  Assessed ALLERGIC RHINITIS as deteriorated - Standley Dakins MD Assessed ASTHMA as unchanged - Standley Dakins MD New Problems: ACUTE SINUSITIS, UNSPECIFIED  (ICD-461.9)   Patient Education: Patient and/or caregiver instructed in the following: rest, Tylenol prn. The risks, benefits and possible side effects were clearly explained and discussed with the patient.  The patient verbalized clear understanding.  The patient was given instructions to return if symptoms don't improve, worsen or new changes develop.  If it is not during clinic hours and the patient cannot get back to this clinic then the patient was told to seek medical care at an available urgent care or emergency department.  The patient verbalized understanding.   Demonstrates willingness to comply.  Plan New Medications/Changes: FLUCONAZOLE 150 MG TABS (FLUCONAZOLE) take 1 tab by mouth x 1 as needed for yeast infection  #1 x 0, 10/01/2010, Clanford Johnson MD FLUTICASONE PROPIONATE 50 MCG/ACT SUSP (FLUTICASONE PROPIONATE) 2 sprays per nostril once daily- Use in place of the nasocort  #1 x 0, 10/01/2010, Clanford Johnson MD AMOXICILLIN 875 MG TABS (AMOXICILLIN) take 1 by mouth two times a day until completed  #20 x 0, 10/01/2010, Clanford Johnson MD  Follow Up: Follow up in 2-3 days if no  improvement, Follow up on an as needed basis, Follow up with Primary Physician  The patient and/or caregiver has been counseled thoroughly with regard to medications prescribed including dosage, schedule, interactions, rationale for use, and possible side effects and they verbalize understanding.  Diagnoses and expected course of recovery discussed and will return if not improved as expected or if the condition worsens. Patient and/or caregiver verbalized understanding.  Prescriptions: FLUCONAZOLE 150 MG TABS (FLUCONAZOLE) take 1 tab by mouth x 1 as needed for yeast infection  #1 x 0   Entered and Authorized by:   Standley Dakins MD   Signed by:   Standley Dakins MD on 10/01/2010   Method used:   Electronically to        [facility] [address] (retail)       [address], [address]        [place], [place]  [address]       Ph: [phone]       Fax: [phone]   RxID:   [id number] FLUTICASONE PROPIONATE 50 MCG/ACT SUSP (FLUTICASONE PROPIONATE) 2 sprays per nostril once daily- Use in place of the nasocort  #1 x 0   Entered and Authorized by:   Standley Dakins MD   Signed by:   Standley Dakins MD on 10/01/2010   Method used:   Electronically to        [facility] [address] (retail)       [address], [address]       [place], [place]  [address]       Ph: [phone]       Fax: [phone]   RxID:   [id number] AMOXICILLIN 875 MG TABS (AMOXICILLIN) take 1 by mouth two times a day until completed  #20 x 0   Entered and Authorized by:   Standley Dakins MD   Signed by:   Standley Dakins MD on 10/01/2010   Method used:   Electronically to        [facility] [address] (retail)       [address], [address]       [place], [place]  [address]       Ph: [phone]       Fax: [phone]   RxID:   [id number]   Comments: 1) I think the patient may have received the wrong prescription of antibiotic from the pharmacy. I explained to her the ciprofloxacin is not ordinarily use to treat sinusitis infections. I gave her prescription for the amoxicillin and asked her to take that for the next 10 days. I told her to discontinue the Nasacort and to try Flonase nasal spray 2 sprays per nostril once daily. I think this may help in addition to the Mucinex D that I would like for her to continue taking. In addition, I recommend that the patient taking fluconazole tablet if she develops a yeast infection. I did give her prescription and I told her to pick it up on the pharmacy if she does develop a yeast infection. Because this is her second course of antibiotics are recommended that she take probiotics and yogurt to avoid a yeast infection. She verbalized understanding.  Patient  Instructions: 1)  Take your antibiotic as prescribed until ALL of it is gone, but stop if you develop a rash or swelling and contact  our office as soon as possible. 2)  Acute sinusitis symptoms for less than 10 days are not helped by antibiotics.Use warm moist compresses, and over the counter decongestants ( only as directed). Call if no improvement in 5-7 days, sooner if increasing pain, fever, or new symptoms. 3)  The risks, benefits and possible side effects were clearly explained and discussed with the patient.  The patient verbalized clear understanding.  The patient was given instructions to return if symptoms don't improve, worsen or new changes develop.  If it is not during clinic hours and the patient cannot get back to this clinic then the patient was told to seek medical care at an available urgent care or emergency department.  The patient verbalized understanding.   4)  The patient was informed that there is no on-call provider or services available at this clinic during off-hours (when the clinic is closed).  If the patient developed a problem or concern that required immediate attention, the patient was advised to go the the nearest available urgent care or emergency department for medical care.  The patient verbalized understanding.    5)  Please schedule an appointment with your primary doctor in : 10 days

## 2010-12-22 NOTE — Assessment & Plan Note (Signed)
Summary: 2 M F/U DLO   Vital Signs:  Patient profile:   55 year old female Weight:      167 pounds Temp:     97.8 degrees F tympanic BP sitting:   110 / 68  (left arm) Cuff size:   regular  Vitals Entered By: Mervin Hack CMA Duncan Dull) (April 29, 2010 10:24 AM) CC: 2 month follow-up   History of Present Illness: Feels well No mood problems at all Really relates to problems with daughter and withdrawl from pain meds and valium from her neck surgery  has gained weight  has fatigued feeling is sleeping better  Allergies: 1)  ! Macrobid (Nitrofurantoin Monohyd Macro) 2)  ! Percocet (Oxycodone-Acetaminophen) 3)  Lisinopril (Lisinopril)  Past History:  Past medical, surgical, family and social histories (including risk factors) reviewed for relevance to current acute and chronic problems.  Past Medical History: Reviewed history from 02/02/2010 and no changes required. Asthma GERD Hypertension Osteopenia Mitral valve prolapse Glaucoma suspect Sleep disturbance Allergies Depression  Past Surgical History: Reviewed history from 02/02/2010 and no changes required. 1/08     Shingles 2000    Hysterectomy/BSO - abn. Paps 1978    Appendix 1987    Breast augmentation Since hysterectomy   - Bladder tack x 2 and rectal repair x 1 2002    Cardiac Cath - okay 6/07     DEXA - boderline (femoral neck  1-2) 5/06     Myoview Stress - EF 56%, no ischemia 11/07/23/09  Right shoulder surgery, then redone with lysis of adhesions 10/10  Admitted with chest pain---myoview stress was normal 1/11 Cervical diskectomy with synthetic discs inserted--  Dr Lovell Sheehan  Family History: Reviewed history from 05/30/2007 and no changes required. Dad ded @70  lung cancer, CAD Mom has DM, CVA, HTN, ovarian cancer, melanoma 1 sister--CAD, DM, dperession HTN strong in family RA in maternal aunt Breast cancer in [place] DM in sister, Mom, grandparents and great grandparents No colon cancer  Social  History: Reviewed history from 05/30/2007 and no changes required. Married--5 children Never Smoked Alcohol use-no Home maker Doesn't exercise  Skipper Cliche sister  Review of Systems       weight up 5# breathing is fine  Physical Exam  General:  alert and normal appearance.   Psych:  normally interactive, good eye contact, not anxious appearing, and not depressed appearing.     Impression & Recommendations:  Problem # 1:  DEPRESSION (ICD-311) Assessment Unchanged remission since starting really anxious to get off meds will decrease dose and see if possible side effects improve can go to every other day in October and then consider stopping at follow up in [facility]  Her updated medication list for this problem includes:    Trazodone Hcl 100 Mg Tabs (Trazodone hcl) .Marland Kitchen... Take 2 by mouth at bedtime    Citalopram Hydrobromide 20 Mg Tabs (Citalopram hydrobromide) .Marland Kitchen... 1/2  tab daily for depression  Complete Medication List: 1)  Advair Diskus 100-50 Mcg/dose Misc (Fluticasone-salmeterol) .Marland Kitchen.. 1 inhalation once a day 2)  Allegra 180 Mg Tabs (Fexofenadine hcl) .Marland Kitchen.. 1 daily as needed for  allergies 3)  Protonix 40 Mg Tbec (Pantoprazole sodium) .... Take 1 by mouth once daily 4)  Trazodone Hcl 100 Mg Tabs (Trazodone hcl) .... Take 2 by mouth at bedtime 5)  Triamterene-hctz 37.5-25 Mg Caps (Triamterene-hctz) .... Take 1 by mouth once daily 6)  Nasacort Aq 55 Mcg/act Aers (Triamcinolone acetonide(nasal)) .... 2 sprays each nostril daily as directed 7)  Citalopram Hydrobromide 20 Mg Tabs (Citalopram hydrobromide) .... 1/2  tab daily for depression 8)  Lumigan 0.03 % Soln (Bimatoprost) .... As directed  Patient Instructions: 1)  Please decrease the citalopram to 1/2 tab daily 2)  If you are doing well with your mood, you can change to every other day in October 3)  Please schedule a follow-up appointment in 6 months .  Prescriptions: ADVAIR DISKUS 100-50 MCG/DOSE MISC  (FLUTICASONE-SALMETEROL) 1 inhalation once a day  #1 x 12   Entered and Authorized by:   Cindee Salt MD   Signed by:   Cindee Salt MD on 04/29/2010   Method used:   Electronically to        [facility] [address] (retail)       [address], [address]       [place], [place]  [address]       Ph: [phone]       Fax: [phone]   RxID:   [id number] NASACORT AQ 55 MCG/ACT  AERS (TRIAMCINOLONE ACETONIDE(NASAL)) 2 sprays each nostril daily as directed  #3 x 3   Entered and Authorized by:   Cindee Salt MD   Signed by:   Cindee Salt MD on 04/29/2010   Method used:   Electronically to        [facility] (mail-order)             ,          Ph: [phone]       Fax: [phone]   RxID:   [id number]   Current Allergies (reviewed today): ! MACROBID (NITROFURANTOIN MONOHYD MACRO) ! PERCOCET (OXYCODONE-ACETAMINOPHEN) LISINOPRIL (LISINOPRIL)
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
## 2010-12-22 NOTE — Assessment & Plan Note (Signed)
Summary: DEPRESSION/CLE   Vital Signs:  Patient profile:   55 year old female Weight:      161 pounds BMI:     25.31 Temp:     98.3 degrees F oral BP sitting:   110 / 80  (left arm) Cuff size:   regular  Vitals Entered By: Mervin Hack CMA Duncan Dull) (February 02, 2010 10:16 AM) CC: depression   History of Present Illness: "It's like a depression took over me" Not sleeping well At first just angry Then overnight, she is in tears all the time (about 3 weeks ago) Stays in the house more Not socializing as much Eating okay but appetite is down No thoughts of death or suicide Loss of joy--anhedonic Was depressed  ~14 years ago with divorce----meds and counselling at that time helped Not sleeping well--hard to initiate then freq awakening---despite the trazodone  getting over cervical disc surgery still feels she has a lump in her throat on dilaudid at first--has stopped it though  Daughter been in and out of house Younger daughter with 2nd child--not living with her now Step father with worsening dementia--mom having a hard time  Happy in marriage Husband has been very concerned--he urged her to come in for evaluation  Allergies: 1)  ! Macrobid (Nitrofurantoin Monohyd Macro) 2)  ! Percocet (Oxycodone-Acetaminophen) 3)  Lisinopril (Lisinopril)  Past History:  Past medical, surgical, family and social histories (including risk factors) reviewed for relevance to current acute and chronic problems.  Past Medical History: Asthma GERD Hypertension Osteopenia Mitral valve prolapse Glaucoma suspect Sleep disturbance Allergies Depression  Past Surgical History: 1/08     Shingles 2000    Hysterectomy/BSO - abn. Paps 1978    Appendix 1987    Breast augmentation Since hysterectomy   - Bladder tack x 2 and rectal repair x 1 2002    Cardiac Cath - okay 6/07     DEXA - boderline (femoral neck  1-2) 5/06     Myoview Stress - EF 56%, no ischemia 11/07/23/09  Right shoulder  surgery, then redone with lysis of adhesions 10/10  Admitted with chest pain---myoview stress was normal 1/11 Cervical diskectomy with synthetic discs inserted--  Dr Lovell Sheehan  Family History: Reviewed history from 05/30/2007 and no changes required. Dad ded @70  lung cancer, CAD Mom has DM, CVA, HTN, ovarian cancer, melanoma 1 sister--CAD, DM, dperession HTN strong in family RA in maternal aunt Breast cancer in [place] DM in sister, Mom, grandparents and great grandparents No colon cancer  Social History: Reviewed history from 05/30/2007 and no changes required. Married--5 children Never Smoked Alcohol use-no Home maker Doesn't exercise  Skipper Cliche sister  Review of Systems  The patient denies chest pain, syncope, and dyspnea on exertion.         has lost some weight in the past month Occ tightness in chest/throat since the surgery-----rarely takes the valium for this. It helps but she is afraid to get hooked (took years to get off ambien) Has jittery feeling inside  Physical Exam  General:  alert.  NAD Psych:  normally interactive, good eye contact, depressed affect, and slightly anxious.     Impression & Recommendations:  Problem # 1:  DEPRESSION (ICD-311) Assessment New FIts criteria for major depressive disorder discussed starting with meds and considering counselling no obvious precipitating event  P: will start citalopram     recheck soon      counselled all of  ~30 minute visit  Her updated medication list for  this problem includes:    Trazodone Hcl 100 Mg Tabs (Trazodone hcl) .Marland Kitchen... 2 at bedtime    Citalopram Hydrobromide 20 Mg Tabs (Citalopram hydrobromide) .Marland Kitchen... 1 tab daily for depression  Complete Medication List: 1)  Advair Diskus 100-50 Mcg/dose Misc (Fluticasone-salmeterol) .Marland Kitchen.. 1 inhalation once a day 2)  Allegra 180 Mg Tabs (Fexofenadine hcl) .Marland Kitchen.. 1 daily as needed for  allergies 3)  Protonix 40 Mg Tbec (Pantoprazole sodium) .Marland Kitchen.. 1 daily 4)   Trazodone Hcl 100 Mg Tabs (Trazodone hcl) .... 2 at bedtime 5)  Lumigan 0.03 % Soln (Bimatoprost) .... As directed 6)  Triamterene-hctz 37.5-25 Mg Caps (Triamterene-hctz) .Marland Kitchen.. 1 daily 7)  Nasacort Aq 55 Mcg/act Aers (Triamcinolone acetonide(nasal)) .... 2 sprays each nostril daily as directed 8)  Citalopram Hydrobromide 20 Mg Tabs (Citalopram hydrobromide) .Marland Kitchen.. 1 tab daily for depression  Patient Instructions: 1)  Call if any significant side effects on the new medicine 2)  Please schedule a follow-up appointment in 2-3 weeks.  Prescriptions: CITALOPRAM HYDROBROMIDE 20 MG TABS (CITALOPRAM HYDROBROMIDE) 1 tab daily for depression  #30 x 3   Entered and Authorized by:   Cindee Salt MD   Signed by:   Cindee Salt MD on 02/02/2010   Method used:   Electronically to        [facility] [address] (retail)       [address]       [place], [place]  [address]       Ph: [phone]       Fax: [phone]   RxID:   [id number]   Current Allergies (reviewed today): ! MACROBID (NITROFURANTOIN MONOHYD MACRO) ! PERCOCET (OXYCODONE-ACETAMINOPHEN) LISINOPRIL (LISINOPRIL)

## 2010-12-22 NOTE — Miscellaneous (Signed)
Summary: med list update- maxzide tabs  Clinical Lists Changes  Medications: Changed medication from TRIAMTERENE-HCTZ 37.5-25 MG  CAPS (TRIAMTERENE-HCTZ) take 1 by mouth once daily to TRIAMTERENE-HCTZ 37.5-25 MG TABS (TRIAMTERENE-HCTZ) take one by mouth daily     Prior Medications: ADVAIR DISKUS 100-50 MCG/DOSE MISC (FLUTICASONE-SALMETEROL) 1 inhalation once a day ALLEGRA 180 MG TABS (FEXOFENADINE HCL) 1 daily as needed for  allergies PROTONIX 40 MG TBEC (PANTOPRAZOLE SODIUM) take 1 by mouth once daily TRAZODONE HCL 100 MG TABS (TRAZODONE HCL) take 2 by mouth at bedtime TRIAMTERENE-HCTZ 37.5-25 MG TABS (TRIAMTERENE-HCTZ) take one by mouth daily NASACORT AQ 55 MCG/ACT  AERS (TRIAMCINOLONE ACETONIDE(NASAL)) 2 sprays each nostril daily as directed CITALOPRAM HYDROBROMIDE 20 MG TABS (CITALOPRAM HYDROBROMIDE) 1/2  tab daily for depression LUMIGAN 0.03 %  SOLN (BIMATOPROST) as directed Current Allergies: ! MACROBID (NITROFURANTOIN MONOHYD MACRO) ! PERCOCET (OXYCODONE-ACETAMINOPHEN) LISINOPRIL (LISINOPRIL)

## 2010-12-27 ENCOUNTER — Emergency Department: Payer: Self-pay | Admitting: Emergency Medicine

## 2011-01-04 ENCOUNTER — Telehealth: Payer: Self-pay | Admitting: Internal Medicine

## 2011-01-11 NOTE — Progress Notes (Signed)
Summary: wants sample of advair  Phone Note Call from Patient Call back at Home Phone [phone]   Caller: Patient Call For: Cindee Salt MD Summary of Call: Pt is requesting a sample pack of advair.  OK to give?                         Lowella Petties CMA, AAMA  January 04, 2011 3:38 PM   Follow-up for Phone Call        okay to give 1-2 months if we have it Cindee Salt MD  January 04, 2011 5:56 PM   left message on machine at home for patient that samples will be up front. Left message on cell phone for patient that samples will be up front. Follow-up by: Mervin Hack CMA Duncan Dull),  January 05, 2011 8:13 AM

## 2011-02-02 ENCOUNTER — Encounter (INDEPENDENT_AMBULATORY_CARE_PROVIDER_SITE_OTHER): Payer: 59 | Admitting: Internal Medicine

## 2011-02-02 ENCOUNTER — Encounter: Payer: Self-pay | Admitting: Internal Medicine

## 2011-02-02 DIAGNOSIS — Z0000 Encounter for general adult medical examination without abnormal findings: Secondary | ICD-10-CM

## 2011-02-06 LAB — CBC
MCHC: 34.9 g/dL (ref 30.0–36.0)
MCV: 90.5 fL (ref 78.0–100.0)
Platelets: 256 10*3/uL (ref 150–400)
WBC: 6.4 10*3/uL (ref 4.0–10.5)

## 2011-02-06 LAB — BASIC METABOLIC PANEL
Chloride: 101 mEq/L (ref 96–112)
GFR calc Af Amer: >60 mL/min (ref >60)
GFR calc non Af Amer: >60 mL/min (ref >60)
Potassium: 4 mEq/L (ref 3.5–5.1)
Sodium: 139 mEq/L (ref 135–145)

## 2011-02-07 NOTE — Assessment & Plan Note (Signed)
Summary: CPX/RBH R/S FROM 01/23/11   Vital Signs:  Patient profile:   55 year old female Weight:      184 pounds Temp:     98.9 degrees F oral Pulse rate:   108 / minute Pulse rhythm:   regular BP sitting:   138 / 88  (left arm) Cuff size:   regular  Vitals Entered By: Mervin Hack CMA Duncan Dull) (February 02, 2011 2:08 PM) CC: adult physical   History of Present Illness: Concerned about BP Only taking 1/2 of the diuretic since starting metoprolol Palpitations gone now though she thinks her heart rate may not be much different  Over the infection but still occ SOB Relates to weight gain---has never been this big  Having lots of issues with feet---having to go to foot doctor Plantar fasciitis and other problems Having knee, back and hip pain also This is limiting her activity Tries to avoid pain meds---tylenol no help Now needs more surgery on her neck--new fusion planned  Interested in trying phentermine again Has helped her in past  Baldder problems have recurred Told she needs another procedure on this terrible dyspareunia due to bulging out of bladder  has ventral bulge that hurts some ER eval and ultrasound was negative for gallbladder  Allergies: 1)  ! Macrobid (Nitrofurantoin Monohyd Macro) 2)  ! Percocet (Oxycodone-Acetaminophen) 3)  ! Biaxin 4)  ! * Cat and Dog 5)  ! * Dust 6)  Lisinopril (Lisinopril)  Past History:  Past medical, surgical, family and social histories (including risk factors) reviewed for relevance to current acute and chronic problems.  Past Medical History: Reviewed history from 10/01/2010 and no changes required. Asthma GERD Hypertension Osteopenia Mitral valve prolapse Glaucoma suspect Sleep disturbance Allergic Rhinitis Depression  Past Surgical History: Reviewed history from 02/02/2010 and no changes required. 1/08     Shingles 2000    Hysterectomy/BSO - abn. Paps 1978    Appendix 1987    Breast augmentation Since  hysterectomy   - Bladder tack x 2 and rectal repair x 1 2002    Cardiac Cath - okay 6/07     DEXA - boderline (femoral neck  1-2) 5/06     Myoview Stress - EF 56%, no ischemia 11/07/23/09  Right shoulder surgery, then redone with lysis of adhesions 10/10  Admitted with chest pain---myoview stress was normal 1/11 Cervical diskectomy with synthetic discs inserted--  Dr Lovell Sheehan  Family History: Reviewed history from 05/30/2007 and no changes required. Dad ded @70  lung cancer, CAD Mom has DM, CVA, HTN, ovarian cancer, melanoma 1 sister--CAD, DM, dperession HTN strong in family RA in maternal aunt Breast cancer in [place] DM in sister, Mom, grandparents and great grandparents No colon cancer  Social History: Reviewed history from 05/30/2007 and no changes required. Married--5 children Never Smoked Alcohol use-no Home maker Doesn't exercise  Skipper Cliche sister  Review of Systems General:  weight up 12# Sleeps okay despite awakening with pain regularly wears seat belt. Eyes:  Denies double vision and vision loss-1 eye. ENT:  Complains of ringing in ears; denies decreased hearing; whooshing sound in left ear has had vertigo again teeth okay---regular with dentist. CV:  Complains of shortness of breath with exertion; denies chest pain or discomfort, difficulty breathing at night, difficulty breathing while lying down, fainting, lightheadness, and palpitations; DOE--relates to weight. Resp:  Complains of shortness of breath; denies cough; some SOB even just sitting--thinks it is related to weight and pushing up of diaphragm Rare wheezing still  on advair--hasn't used rescue inhaler. GI:  Complains of abdominal pain and indigestion; denies bloody stools, change in bowel habits, and dark tarry stools; needs tums despite the protonix ongoing pain around bulging area periumbilical. GU:  Denies incontinence; sig dyspareunia. MS:  See HPI; Complains of joint pain. Derm:  Denies  lesion(s) and rash; sees derm Roseanne Reno) every 6 months. Neuro:  Complains of headaches; denies numbness, tingling, and weakness. Psych:  Complains of depression; denies anxiety; intermittent aggravation due to multiple medical problems No persistent depressed mood. Heme:  Denies abnormal bruising and enlarge lymph nodes. Allergy:  Complains of seasonal allergies and sneezing; allegra as needed .  Physical Exam  General:  alert and normal appearance.  Frustrated by NAD Head:  normocephalic and atraumatic.   Eyes:  pupils equal, pupils round, pupils reactive to light, and no optic disk abnormalities.   Ears:  R ear normal and L ear normal.   Mouth:  no erythema, no exudates, and no lesions.   Neck:  supple, no masses, no thyromegaly, no carotid bruits, and no cervical lymphadenopathy.   fairly normal ROM Breasts:  no abnormal thickening, no tenderness, and no adenopathy.  Implants in place Lungs:  normal respiratory effort, no intercostal retractions, no accessory muscle use, and normal breath sounds.   Heart:  normal rate, regular rhythm, no murmur, and no gallop.   Abdomen:  soft, non-tender, and no masses.   Msk:  thickening in PIP in hands no inflammation Pulses:  1+ in feet Extremities:  no edema Neurologic:  alert & oriented X3, strength normal in all extremities, and gait normal.   Skin:  no rashes and no suspicious lesions.   Psych:  normally interactive, good eye contact, not anxious appearing, and not depressed appearing.     Impression & Recommendations:  Problem # 1:  PREVENTIVE HEALTH CARE (ICD-V70.0) Assessment Comment Only due for mammo really concerned about inablity to exercise with neck, foot, etc problems weight is up will try the phentermine again and follow up  Problem # 2:  HYPERTENSION (ICD-401.9) Assessment: Unchanged will have her take full diuretic again (I had never changed the dose)  Her updated medication list for this problem includes:     Triamterene-hctz 37.5-25 Mg Tabs (Triamterene-hctz) .Marland Kitchen... Take 1 by mouth once daily    Metoprolol Succinate 25 Mg Xr24h-tab (Metoprolol succinate) .Marland Kitchen... 1/2 tab daily as directed  BP today: 138/88 Prior BP: 118/76 (10/12/2010)  Labs Reviewed: K+: 4.1 (09/05/2010) Creat: : 0.9 (09/05/2010)   Chol: 217 (09/05/2010)   HDL: 61.50 (09/05/2010)   LDL: 116 (02/23/2009)   TG: 162.0 (09/05/2010)  Problem # 3:  GERD (ICD-530.81) Assessment: Deteriorated bothered by ventral hernia increased symptoms no change for now  Her updated medication list for this problem includes:    Protonix 40 Mg Tbec (Pantoprazole sodium) .Marland Kitchen... Take 1 by mouth once daily  Problem # 4:  ASTHMA (ICD-493.90) Assessment: Unchanged on advair will Rx rescue inhaler  Her updated medication list for this problem includes:    Advair Diskus 100-50 Mcg/dose Misc (Fluticasone-salmeterol) .Marland Kitchen... 1 inhalation once a day    Xopenex Hfa 45 Mcg/act Aero (Levalbuterol tartrate) .Marland Kitchen... 2 puffs three times a day as needed for wheezing  Complete Medication List: 1)  Advair Diskus 100-50 Mcg/dose Misc (Fluticasone-salmeterol) .Marland Kitchen.. 1 inhalation once a day 2)  Protonix 40 Mg Tbec (Pantoprazole sodium) .... Take 1 by mouth once daily 3)  Trazodone Hcl 100 Mg Tabs (Trazodone hcl) .... Take 2 by mouth at bedtime  4)  Triamterene-hctz 37.5-25 Mg Tabs (Triamterene-hctz) .... Take 1 by mouth once daily 5)  Lumigan 0.03 % Soln (Bimatoprost) .... As directed 6)  Metoprolol Succinate 25 Mg Xr24h-tab (Metoprolol succinate) .... 1/2 tab daily as directed 7)  Xopenex Hfa 45 Mcg/act Aero (Levalbuterol tartrate) .... 2 puffs three times a day as needed for wheezing 8)  Phentermine Hcl 37.5 Mg Caps (Phentermine hcl) .Marland Kitchen.. 1 capsule daily to decrease appetite  Other Orders: Radiology Referral (Radiology)  Patient Instructions: 1)  Please schedule a follow-up appointment in 3 months .  2)  Schedule your mammogram.  Prescriptions: TRAZODONE HCL 100  MG TABS (TRAZODONE HCL) take 2 by mouth at bedtime  #180 x 3   Entered by:   Mervin Hack CMA (AAMA)   Authorized by:   Cindee Salt MD   Signed by:   Mervin Hack CMA (AAMA) on 02/02/2011   Method used:   Electronically to        [facility]* (retail)             ,          Ph: [phone]       Fax: [phone]   RxID:   [id number] PROTONIX 40 MG TBEC (PANTOPRAZOLE SODIUM) take 1 by mouth once daily  #90 x 3   Entered by:   Mervin Hack CMA (AAMA)   Authorized by:   Cindee Salt MD   Signed by:   Mervin Hack CMA (AAMA) on 02/02/2011   Method used:   Electronically to        [facility]* (retail)             ,          Ph: [phone]       Fax: [phone]   RxID:   [id number] METOPROLOL SUCCINATE 25 MG XR24H-TAB (METOPROLOL SUCCINATE) 1/2 tab daily as directed  #45 x 3   Entered by:   Mervin Hack CMA (AAMA)   Authorized by:   Cindee Salt MD   Signed by:   Mervin Hack CMA (AAMA) on 02/02/2011   Method used:   Electronically to        [facility] [address] (retail)       [address], [address]       [place], [place]  [address]       Ph: [phone]       Fax: [phone]   RxID:   [id number] TRIAMTERENE-HCTZ 37.5-25 MG TABS (TRIAMTERENE-HCTZ) take 1 by mouth once daily  #90 x 3   Entered by:   Mervin Hack CMA (AAMA)   Authorized by:   Cindee Salt MD   Signed by:   Mervin Hack CMA (AAMA) on 02/02/2011   Method used:   Electronically to        [facility] [address] (retail)       [address], [address]       [place], [place]  [address]       Ph: [phone]       Fax: [phone]   RxID:   [id number] ADVAIR DISKUS 100-50 MCG/DOSE MISC (FLUTICASONE-SALMETEROL) 1 inhalation once a day  #1 x 12   Entered by:   Mervin Hack CMA (AAMA)   Authorized by:   Cindee Salt MD   Signed by:   Mervin Hack CMA (  AAMA) on  02/02/2011   Method used:   Electronically to        [facility] [address] (retail)       [address], [address]       [place], [place]  [address]       Ph: [phone]       Fax: [phone]   RxID:   [id number] TRAZODONE HCL 100 MG TABS (TRAZODONE HCL) take 2 by mouth at bedtime  #180 x 3   Entered and Authorized by:   Cindee Salt MD   Signed by:   Cindee Salt MD on 02/02/2011   Method used:   Print then Give to Patient   RxID:   [id number] PROTONIX 40 MG TBEC (PANTOPRAZOLE SODIUM) take 1 by mouth once daily  #90 x 3   Entered and Authorized by:   Cindee Salt MD   Signed by:   Cindee Salt MD on 02/02/2011   Method used:   Print then Give to Patient   RxID:   [id number] PHENTERMINE HCL 37.5 MG CAPS (PHENTERMINE HCL) 1 capsule daily to decrease appetite  #30 x 2   Entered and Authorized by:   Cindee Salt MD   Signed by:   Cindee Salt MD on 02/02/2011   Method used:   Print then Give to Patient   RxID:   [id number] HFA 45 MCG/ACT AERO (LEVALBUTEROL TARTRATE) 2 puffs three times a day as needed for wheezing  #1 x 1   Entered and Authorized by:   Cindee Salt MD   Signed by:   Cindee Salt MD on 02/02/2011   Method used:   Electronically to        [facility] [address] (retail)       [address], [address]       [place], [place]  [address]       Ph: [phone]       Fax: [phone]   RxID:   [id number]    Orders Added: 1)  Radiology Referral [Radiology] 2)  Est. Patient 40-64 years [phone]    Current Allergies (reviewed today): ! MACROBID (NITROFURANTOIN MONOHYD MACRO) ! PERCOCET (OXYCODONE-ACETAMINOPHEN) ! BIAXIN ! * CAT AND DOG ! * DUST LISINOPRIL (LISINOPRIL)

## 2011-02-20 ENCOUNTER — Telehealth: Payer: Self-pay | Admitting: *Deleted

## 2011-02-20 NOTE — Telephone Encounter (Signed)
Pt was seen in march for a physical. At the time she was having back and abd pain and nausea.  She says the nausea has gotten worse.  She is asking to have a hydascan to see what is going on.  She prefers to go to [place] for this.

## 2011-02-20 NOTE — Telephone Encounter (Signed)
Please call I do not find HIDA scans very useful I am concerned about the phentermine causing problems also I think she needs to come back in to review what is going on before I can order any tests (if any are even needed) Might be more appropriate to see GI specialist also

## 2011-02-21 NOTE — Telephone Encounter (Signed)
Patient advised as instructed via telephone.  See agrees to come in for an appt.  Transferred the call to front desk for scheduling.

## 2011-02-23 ENCOUNTER — Encounter: Payer: Self-pay | Admitting: Internal Medicine

## 2011-02-27 ENCOUNTER — Ambulatory Visit (INDEPENDENT_AMBULATORY_CARE_PROVIDER_SITE_OTHER): Payer: 59 | Admitting: Internal Medicine

## 2011-02-27 ENCOUNTER — Encounter: Payer: Self-pay | Admitting: Internal Medicine

## 2011-02-27 VITALS — BP 120/80 | HR 94 | Temp 98.5°F | Wt 175.0 lb

## 2011-02-27 DIAGNOSIS — R1013 Epigastric pain: Secondary | ICD-10-CM

## 2011-02-27 NOTE — Progress Notes (Signed)
Subjective:    Patient ID: Shannon [name], female    DOB: 1956/04/19, 55 y.o.   MRN: [id number]  HPI Stopped the phentermine due to nausea Didn't improve--or very little After eating, esp at night, has to belch due to tremendous pressure near epigastrium Nausea daily --tends to be after eating No vomiting No trouble with bowels No blood in stool or black stool  Trying to drink more water Smaller meals in effort to lose weight--no large meals but supper is largest meal and she gets more trouble then Tried emetrol--slight help Hasn't tried gas meds Tums helps her burp and that does relieve some of the pressure  Abdominal hernia hurts at times More noticeable if she lies on her side in bed Past Medical History  Diagnosis Date  . Asthma   . GERD (gastroesophageal reflux disease)   . Hypertension   . Osteopenia   . Mitral valve prolapse   . Glaucoma suspect   . Sleep disturbance   . Allergy   . Depression   . Shingles 11/2006    Past Surgical History  Procedure Date  . Abdominal hysterectomy 2000    /BSO - abn. Paps  . Appendectomy 1978  . Breast enhancement surgery 1987  . Bladder repair     x2  . Rectal prolapse repair     x1  . Cardiac catheterization   . Nm myoview ltd 03/2005    EF 56%, no ischemia  . Shoulder surgery 09/2008, 01/2009    Right shoulder surgery, then redone with lysis of adhesions  . Cervical discectomy 11/2009    with synthetic discs inserted--  Dr Lovell Sheehan    Family History  Problem Relation Age of Onset  . Diabetes Mother   . Heart disease Mother   . Hypertension Mother   . Ovarian cancer Mother   . Melanoma Mother   . Cancer Father   . Coronary artery disease Father   . Diabetes Sister   . Depression Sister   . Coronary artery disease Sister   . Rheum arthritis Maternal Aunt   . Breast cancer Maternal Grandmother   . Diabetes Maternal Grandmother   . Diabetes Maternal Grandfather   . Colon cancer Neg Hx     History    Social History  . Marital Status: Married    Spouse Name: N/A    Number of Children: 5  . Years of Education: N/A   Occupational History  . homemaker    Social History Main Topics  . Smoking status: Current Everyday Smoker    Types: Cigarettes  . Smokeless tobacco: Not on file  . Alcohol Use: No  . Drug Use: Not on file  . Sexually Active: Not on file   Other Topics Concern  . Not on file   Social History Narrative   Home makerDoesn't exerciseSherry Talley's sister   Review of Systems Weight down 9# since last visit Sleeping okay    Objective:   Physical Exam  Constitutional: She appears well-developed and well-nourished. No distress.  Neck: Normal range of motion. No thyromegaly present.  Cardiovascular: Normal rate, regular rhythm and normal heart sounds.  Exam reveals no gallop.   No murmur heard. Pulmonary/Chest: Effort normal. No respiratory distress. She has no wheezes. She has no rales.  Abdominal: Soft. Bowel sounds are normal. She exhibits no mass. There is no tenderness.       Broad central ventral hernia from below xiphoid to almost umbilicus. No evidence of incarceration  Lymphadenopathy:  She has no cervical adenopathy.  Psychiatric: She has a normal mood and affect. Her behavior is normal. Judgment and thought content normal.          Assessment & Plan:

## 2011-02-27 NOTE — Patient Instructions (Signed)
Please set up the appointment with Dr Mechele Collin

## 2011-03-10 ENCOUNTER — Ambulatory Visit: Payer: Self-pay | Admitting: Internal Medicine

## 2011-03-21 ENCOUNTER — Encounter: Payer: Self-pay | Admitting: *Deleted

## 2011-03-24 ENCOUNTER — Encounter: Payer: Self-pay | Admitting: Internal Medicine

## 2011-03-31 ENCOUNTER — Ambulatory Visit: Payer: Self-pay | Admitting: Unknown Physician Specialty

## 2011-04-03 ENCOUNTER — Telehealth: Payer: Self-pay | Admitting: *Deleted

## 2011-04-03 NOTE — Telephone Encounter (Signed)
Shannon [name] at [facility] called.  Pt's ANA is up and doctor there wants to refer pt to rheumatologist, they are asking if you want to refer or if ok for them to refer.  Pt wants to see Lavenia Atlas. Please advise.

## 2011-04-03 NOTE — Telephone Encounter (Signed)
Okay for them to refer Make sure Dr Lavenia Atlas knows that I am her primary Elevated ANA is fairly nonspecific but I have no problem with her seeing Dr Gavin Potters, at least for a consultation

## 2011-04-04 NOTE — Telephone Encounter (Signed)
Advised Tami at Dr [employer] office.

## 2011-04-05 ENCOUNTER — Ambulatory Visit: Payer: Self-pay | Admitting: Unknown Physician Specialty

## 2011-04-07 NOTE — Cardiovascular Report (Signed)
[place]  Patient:    MITA, VALLO                        MRN: [id number] Proc. Date: 04/24/01 Attending:  Veneda Melter, M.D. CC:         Darlin Priestly. Lady Gary, M.D., [facility]  Marcina Millard, M.D., [facility]   Cardiac Catheterization  PROCEDURES PERFORMED: 1. Left heart catheterization. 2. Left ventriculogram. 3. Selective coronary angiography.  DIAGNOSES: 1. Normal coronary arteries by angiogram. 2. Normal left ventricular systolic function.  HISTORY:  Ms. Steadman is a 55 year old white female with a history of mitral valve prolapse and SVT who presents with chest pain.  The patient had onset of chest discomfort approximately one year ago.  This was associated with exertion and radiation to the left arm.  Over the past three to four months, it has progressed to occurring occasionally at rest.  The patient has undergone stress imaging studies which have shown no ischemia.  Due to persistence of symptoms, she presents now for further assessment.  DESCRIPTION OF PROCEDURE:  Informed consent was obtained.  The patient was brought to the catheterization lab.  A 6-French sheath was placed in the right femoral artery.  Left heart catheterization, selective angiography were then performed in the usual fashion using preformed 6-French catheters.  At the termination of the case, a Perclose suture closure device was introduced; however, due to a malfunctioning device, this was retracted and manual pressure applied until adequate hemostasis was achieved.  The patient tolerated the procedure well and was transferred to the floor in stable condition.  Findings are as follows:  FINDINGS: 1. Left main trunk:  Angiographically normal. 2. LAD:  Large caliber vessel that provides a large first diagonal branch in    the proximal segment.  This is angiographically normal. 3. Left circumflex artery:  Large caliber vessel that provides a  large    bifurcating first marginal branch in its proximal segment, and a smaller    second marginal branch distally.  The left circumflex system is    angiographically normal. 4. Right coronary artery:  Dominant.  This is a medium caliber vessel which    provides a posterior descending artery and a small posterior ventricular    branch in its terminal segment.  The right coronary artery is    angiographically normal.  LV:  Normal end systolic and end diastolic dimensions.  Overall left ventricular function is well preserved.  Ejection fraction of greater than 65%.  No mitral regurgitation.  LV pressure 145/10.  Aortic was 145/85.  LVEDP was 25.  ASSESSMENT AND PLAN:  Ms. Goehring is a 54 year old white female with noncardiac chest pain.  Other causes of chest discomfort will be investigated. DD:  04/24/01 TD:  04/24/01 Job: [id number]

## 2011-04-12 ENCOUNTER — Other Ambulatory Visit: Payer: Self-pay | Admitting: *Deleted

## 2011-04-12 MED ORDER — METOPROLOL SUCCINATE ER 25 MG PO TB24: 25.0000 mg | ORAL_TABLET | Freq: Every day | ORAL | Status: DC

## 2011-04-13 ENCOUNTER — Telehealth: Payer: Self-pay | Admitting: *Deleted

## 2011-04-13 NOTE — Telephone Encounter (Signed)
[employer] has faxed form asking for clarification on metoprolol script.  Form is on your desk.  There are two sets of directions on the script, the one half daily is what is in centricity.

## 2011-04-13 NOTE — Telephone Encounter (Signed)
Signed form Please confirm with patient before faxing back

## 2011-04-14 NOTE — Telephone Encounter (Signed)
Verified with pt that she does take one half of 25 mg's daily, faxed form back to [facility].

## 2011-05-09 ENCOUNTER — Ambulatory Visit: Payer: 59 | Admitting: Internal Medicine

## 2011-06-07 ENCOUNTER — Ambulatory Visit: Payer: 59 | Admitting: Internal Medicine

## 2011-06-30 ENCOUNTER — Ambulatory Visit: Payer: 59 | Admitting: Internal Medicine

## 2011-08-22 ENCOUNTER — Telehealth: Payer: Self-pay | Admitting: *Deleted

## 2011-08-22 NOTE — Telephone Encounter (Signed)
Pt is asking if she can have some advair samples- 100/50.  States she has been out for awhile and really doesn't have the money to buy any.  There are some in the cabinet.

## 2011-08-23 NOTE — Telephone Encounter (Signed)
Spoke with patient and advised the samples would be up front.

## 2011-08-23 NOTE — Telephone Encounter (Signed)
Okay to give 2 months worth if we have

## 2011-08-25 ENCOUNTER — Telehealth: Payer: Self-pay | Admitting: *Deleted

## 2011-08-25 NOTE — Telephone Encounter (Signed)
Patient came in to pick up samples and wanted to know what to do about the cough she still has, pt asking if she should see a specialist? Pt states she's still coughing. Please advise, she knows you're out of the office

## 2011-08-26 NOTE — Telephone Encounter (Signed)
Doesn't look like I have seen her for a while She needs to make an appt and I can refer her to pulmonary if we don't have an explanation for what is going on with the persistent cough

## 2011-08-28 NOTE — Telephone Encounter (Signed)
Spoke with patient and she scheduled appt for 08/31/11 @ 4:15

## 2011-08-31 ENCOUNTER — Encounter: Payer: Self-pay | Admitting: Internal Medicine

## 2011-08-31 ENCOUNTER — Ambulatory Visit (INDEPENDENT_AMBULATORY_CARE_PROVIDER_SITE_OTHER): Payer: 59 | Admitting: Internal Medicine

## 2011-08-31 VITALS — BP 124/77 | HR 91 | Temp 98.6°F | Resp 12 | Wt 174.0 lb

## 2011-08-31 DIAGNOSIS — J019 Acute sinusitis, unspecified: Secondary | ICD-10-CM | POA: Insufficient documentation

## 2011-08-31 MED ORDER — AMOXICILLIN-POT CLAVULANATE 875-125 MG PO TABS: 1.0000 | ORAL_TABLET | Freq: Two times a day (BID) | ORAL | Status: AC

## 2011-08-31 NOTE — Progress Notes (Signed)
Subjective:    Patient ID: Shannon [name], female    DOB: 1956/02/11, 55 y.o.   MRN: [id number]  HPI Has been sick for about a month Seen at walk in and given omnicef and mucinex Still coughing up a lot of mucus ---now yellow instead of green Back right temporal headache for 4 days---relates to cough  Did have SOB--but this is better Hears a whine at end of breath when in bed  No sore throat Some head congestion Thick nasal congestion---does drain down throat Doesn't feel as much in chest now---mostly in chest now  Fever about 2 weeks ago No night sweats or chills now---did have at first  Cough is now worse at night---not as bad in day  Some hoarseness  Has been seeing Fransico Setters NP at [place] Diagnosed with fatty liver disease---but not clear about the extent  Current Outpatient Prescriptions on File Prior to Visit  Medication Sig Dispense Refill  . bimatoprost (LUMIGAN) 0.03 % ophthalmic drops 1 drop at bedtime.        . Fluticasone-Salmeterol (ADVAIR DISKUS) 100-50 MCG/DOSE AEPB Inhale 1 puff into the lungs every 12 (twelve) hours.        Marland Kitchen levalbuterol (XOPENEX HFA) 45 MCG/ACT inhaler Inhale 2 puffs into the lungs 3 (three) times daily as needed.        . metoprolol succinate (TOPROL-XL) 25 MG 24 hr tablet Take 1 tablet (25 mg total) by mouth daily. Take 1/2 tab by mouth once daily  45 tablet  3  . pantoprazole (PROTONIX) 40 MG tablet Take 40 mg by mouth daily.        . traZODone (DESYREL) 100 MG tablet Take 200 mg by mouth at bedtime.        . triamterene-hydrochlorothiazide (DYAZIDE) 37.5-25 MG per capsule Take 1 capsule by mouth every morning.          Allergies  Allergen Reactions  . Clarithromycin   . Lisinopril     REACTION: cough  . Nitrofurantoin   . Oxycodone-Acetaminophen     REACTION: itching    Past Medical History  Diagnosis Date  . Asthma   . GERD (gastroesophageal reflux disease)   . Hypertension   . Osteopenia   . Mitral valve prolapse   .  Glaucoma suspect   . Sleep disturbance   . Allergy   . Depression   . Shingles 11/2006    Past Surgical History  Procedure Date  . Abdominal hysterectomy 2000    /BSO - abn. Paps  . Appendectomy 1978  . Breast enhancement surgery 1987  . Bladder repair     x2  . Rectal prolapse repair     x1  . Cardiac catheterization   . Nm myoview ltd 03/2005    EF 56%, no ischemia  . Shoulder surgery 09/2008, 01/2009    Right shoulder surgery, then redone with lysis of adhesions  . Cervical discectomy 11/2009    with synthetic discs inserted--  Dr Lovell Sheehan    Family History  Problem Relation Age of Onset  . Diabetes Mother   . Heart disease Mother   . Hypertension Mother   . Ovarian cancer Mother   . Melanoma Mother   . Cancer Father   . Coronary artery disease Father   . Diabetes Sister   . Depression Sister   . Coronary artery disease Sister   . Rheum arthritis Maternal Aunt   . Breast cancer Maternal Grandmother   . Diabetes Maternal Grandmother   .  Diabetes Maternal Grandfather   . Colon cancer Neg Hx     History   Social History  . Marital Status: Married    Spouse Name: N/A    Number of Children: 5  . Years of Education: N/A   Occupational History  . homemaker    Social History Main Topics  . Smoking status: Never Smoker   . Smokeless tobacco: Never Used  . Alcohol Use: No  . Drug Use: Not on file  . Sexually Active: Not on file   Other Topics Concern  . Not on file   Social History Narrative   Home makerDoesn't exerciseSherry Talley's sister    Review of Systems No vomiting or diarrhea Eating okay    Objective:   Physical Exam  Constitutional: She appears well-developed and well-nourished. No distress.  HENT:  Right Ear: External ear normal.  Left Ear: External ear normal.  Mouth/Throat: Oropharynx is clear and moist. No oropharyngeal exudate.       Marked congestion in nose--esp on right bilat maxillary tenderness and marked right frontal  tenderness  Neck: Normal range of motion. Neck supple.  Pulmonary/Chest: Effort normal and breath sounds normal. No respiratory distress. She has no wheezes. She has no rales.  Lymphadenopathy:    She has no cervical adenopathy.          Assessment & Plan:

## 2011-08-31 NOTE — Assessment & Plan Note (Signed)
Goes back a month Partial response to omnicef Bad frontal headache  Will give 3 weeks of augmentin She can use tramadol she has for cough at night

## 2011-09-04 ENCOUNTER — Emergency Department: Payer: Self-pay | Admitting: Emergency Medicine

## 2011-10-19 ENCOUNTER — Ambulatory Visit (INDEPENDENT_AMBULATORY_CARE_PROVIDER_SITE_OTHER): Payer: 59 | Admitting: Internal Medicine

## 2011-10-19 ENCOUNTER — Encounter: Payer: Self-pay | Admitting: Internal Medicine

## 2011-10-19 VITALS — BP 123/72 | HR 101 | Temp 97.8°F | Ht 64.0 in | Wt 174.0 lb

## 2011-10-19 DIAGNOSIS — Z23 Encounter for immunization: Secondary | ICD-10-CM

## 2011-10-19 DIAGNOSIS — R7301 Impaired fasting glucose: Secondary | ICD-10-CM

## 2011-10-19 DIAGNOSIS — E119 Type 2 diabetes mellitus without complications: Secondary | ICD-10-CM

## 2011-10-19 DIAGNOSIS — E1149 Type 2 diabetes mellitus with other diabetic neurological complication: Secondary | ICD-10-CM | POA: Insufficient documentation

## 2011-10-19 MED ORDER — METFORMIN HCL 1000 MG PO TABS: 1000.0000 mg | ORAL_TABLET | Freq: Two times a day (BID) | ORAL | Status: DC

## 2011-10-19 NOTE — Assessment & Plan Note (Signed)
New diagnosis Fairly classic presentation Fingerstick 371  Will set up for diabetic counselling Start metformin

## 2011-10-19 NOTE — Progress Notes (Signed)
Subjective:    Patient ID: Shannon [name], female    DOB: 08-Apr-1956, 55 y.o.   MRN: [id number]  HPI Doesn't feel good hsa not felt right for 4 months First the diarrhea mess that sent her to hospital Then the sinus infection which hasn't completely cleared Bad nasal congestion--has been using afrin all the time Decongestants didn't help  Now with severe fatigue Hard even to walk around a store--"legs feel like rubber" Nausea after eating Appetite is poor after breakfast---craving sugary drinks now  No fever Some SOB---mostly due to nasal congestion Slight chest pain---tight feeling with activity. This is new and very mild  Current Outpatient Prescriptions on File Prior to Visit  Medication Sig Dispense Refill  . bimatoprost (LUMIGAN) 0.03 % ophthalmic drops 1 drop at bedtime.        . Fluticasone-Salmeterol (ADVAIR DISKUS) 100-50 MCG/DOSE AEPB Inhale 1 puff into the lungs every 12 (twelve) hours.        . pantoprazole (PROTONIX) 40 MG tablet Take 40 mg by mouth daily.        . traZODone (DESYREL) 100 MG tablet Take 200 mg by mouth at bedtime.        . triamterene-hydrochlorothiazide (DYAZIDE) 37.5-25 MG per capsule Take 1 capsule by mouth every morning.          Allergies  Allergen Reactions  . Clarithromycin   . Lisinopril     REACTION: cough  . Nitrofurantoin   . Oxycodone-Acetaminophen     REACTION: itching    Past Medical History  Diagnosis Date  . Asthma   . GERD (gastroesophageal reflux disease)   . Hypertension   . Osteopenia   . Mitral valve prolapse   . Glaucoma suspect   . Sleep disturbance   . Allergy   . Depression   . Shingles 11/2006    Past Surgical History  Procedure Date  . Abdominal hysterectomy 2000    /BSO - abn. Paps  . Appendectomy 1978  . Breast enhancement surgery 1987  . Bladder repair     x2  . Rectal prolapse repair     x1  . Cardiac catheterization   . Nm myoview ltd 03/2005    EF 56%, no ischemia  . Shoulder surgery  09/2008, 01/2009    Right shoulder surgery, then redone with lysis of adhesions  . Cervical discectomy 11/2009    with synthetic discs inserted--  Dr Lovell Sheehan    Family History  Problem Relation Age of Onset  . Diabetes Mother   . Heart disease Mother   . Hypertension Mother   . Ovarian cancer Mother   . Melanoma Mother   . Cancer Father   . Coronary artery disease Father   . Diabetes Sister   . Depression Sister   . Coronary artery disease Sister   . Rheum arthritis Maternal Aunt   . Breast cancer Maternal Grandmother   . Diabetes Maternal Grandmother   . Diabetes Maternal Grandfather   . Colon cancer Neg Hx     History   Social History  . Marital Status: Married    Spouse Name: N/A    Number of Children: 5  . Years of Education: N/A   Occupational History  . homemaker    Social History Main Topics  . Smoking status: Never Smoker   . Smokeless tobacco: Never Used  . Alcohol Use: No  . Drug Use: Not on file  . Sexually Active: Not on file   Other Topics Concern  .  Not on file   Social History Narrative   Home makerDoesn't exerciseSherry Talley's sister   Review of Systems No diarrhea No dysuria or hematuria    Objective:   Physical Exam  Constitutional: She appears well-developed and well-nourished. No distress.       Looks washed out  HENT:  Right Ear: External ear normal.  Left Ear: External ear normal.  Mouth/Throat: Oropharynx is clear and moist. No oropharyngeal exudate.       Nose has marked swelling bilaterally--obstructive  Neck: Normal range of motion. Neck supple.  Cardiovascular: Normal rate, regular rhythm and normal heart sounds.  Exam reveals no gallop.   No murmur heard. Pulmonary/Chest: Effort normal and breath sounds normal. No respiratory distress. She has no wheezes. She has no rales.  Abdominal: Soft. There is no tenderness.  Musculoskeletal: She exhibits no edema and no tenderness.  Lymphadenopathy:    She has no cervical  adenopathy.    She has no axillary adenopathy.  Psychiatric: Her behavior is normal. Judgment and thought content normal.          Assessment & Plan:

## 2011-10-19 NOTE — Patient Instructions (Signed)
Please set up the appointment with diabetic counsellor  Start the metformin with 1/2 tab (500mg ) twice a day before breakfast and supper for 2-3 days. If no stomach upset or diarrhea, increase to the full tab twice a day

## 2011-10-19 NOTE — Progress Notes (Signed)
Addended by: Sueanne Margarita on: 10/19/2011 05:06 PM   Modules accepted: Orders

## 2011-10-20 LAB — BASIC METABOLIC PANEL
BUN: 21 mg/dL (ref 6–23)
CO2: 26 mEq/L (ref 19–32)
Chloride: 99 mEq/L (ref 96–112)
Glucose, Bld: 412 mg/dL — ABNORMAL HIGH (ref 70–99)
Potassium: 4.3 mEq/L (ref 3.5–5.1)

## 2011-10-20 LAB — CBC WITH DIFFERENTIAL/PLATELET
Basophils Absolute: 0.1 10*3/uL (ref 0.0–0.1)
Eosinophils Absolute: 0.3 10*3/uL (ref 0.0–0.7)
HCT: 39.9 % (ref 36.0–46.0)
Lymphs Abs: 2.5 10*3/uL (ref 0.7–4.0)
MCV: 88.5 fl (ref 78.0–100.0)
Monocytes Absolute: 0.6 10*3/uL (ref 0.1–1.0)
Platelets: 234 10*3/uL (ref 150.0–400.0)
RDW: 13.1 % (ref 11.5–14.6)

## 2011-10-20 LAB — HEPATIC FUNCTION PANEL: Total Bilirubin: 0.3 mg/dL (ref 0.3–1.2)

## 2011-10-20 LAB — TSH: TSH: 0.95 u[IU]/mL (ref 0.35–5.50)

## 2011-10-24 ENCOUNTER — Encounter: Payer: Self-pay | Admitting: *Deleted

## 2011-11-06 ENCOUNTER — Ambulatory Visit: Payer: Self-pay | Admitting: Internal Medicine

## 2011-11-15 ENCOUNTER — Encounter: Payer: Self-pay | Admitting: Internal Medicine

## 2011-11-15 ENCOUNTER — Ambulatory Visit (INDEPENDENT_AMBULATORY_CARE_PROVIDER_SITE_OTHER): Payer: 59 | Admitting: Internal Medicine

## 2011-11-15 VITALS — BP 120/80 | HR 96 | Temp 98.3°F | Ht 64.0 in | Wt 171.0 lb

## 2011-11-15 DIAGNOSIS — J069 Acute upper respiratory infection, unspecified: Secondary | ICD-10-CM | POA: Insufficient documentation

## 2011-11-15 DIAGNOSIS — E119 Type 2 diabetes mellitus without complications: Secondary | ICD-10-CM

## 2011-11-15 MED ORDER — GLUCOSE BLOOD VI STRP: 1.0000 | ORAL_STRIP | Freq: Every day | Status: DC

## 2011-11-15 NOTE — Assessment & Plan Note (Signed)
Doing much better Feels back to normal Reasonable control now Will be going through diabetic classes  Will continue metformin at 500mg  bid Needs Rx for strips--discussed doing daily

## 2011-11-15 NOTE — Progress Notes (Signed)
Subjective:    Patient ID: Shannon [name], female    DOB: September 05, 1956, 55 y.o.   MRN: [id number]  HPI Doing much better Feels back to normal  Has gone to the diabetic counselor for intake Has classes starting next month Checking bid for now AM 122-153 2 hours after eating 126-189  No hypoglycemic reactions Did have some upset stomach on the metformin for the first few days---now better Initial diarrhea which is better  Also having some sore throat and cough Started 2 days ago No clear fever but felt warm at night No SOB Hasn't used any meds Had had headache---tylenol/advil helps some  Current Outpatient Prescriptions on File Prior to Visit  Medication Sig Dispense Refill  . bimatoprost (LUMIGAN) 0.03 % ophthalmic drops 1 drop at bedtime.        . cetirizine (ZYRTEC) 10 MG tablet Take 10 mg by mouth daily.        . Fluticasone-Salmeterol (ADVAIR DISKUS) 100-50 MCG/DOSE AEPB Inhale 1 puff into the lungs every 12 (twelve) hours.        . meloxicam (MOBIC) 7.5 MG tablet Take 7.5 mg by mouth daily.        . metFORMIN (GLUCOPHAGE) 1000 MG tablet Take 1 tablet (1,000 mg total) by mouth 2 (two) times daily with a meal.  60 tablet  11  . metoprolol succinate (TOPROL-XL) 25 MG 24 hr tablet Take 12.5 mg by mouth daily.        . pantoprazole (PROTONIX) 40 MG tablet Take 40 mg by mouth daily.        . traZODone (DESYREL) 100 MG tablet Take 200 mg by mouth at bedtime.        . triamterene-hydrochlorothiazide (DYAZIDE) 37.5-25 MG per capsule Take 1 capsule by mouth every morning.          Allergies  Allergen Reactions  . Clarithromycin   . Lisinopril     REACTION: cough  . Nitrofurantoin   . Oxycodone-Acetaminophen     REACTION: itching    Past Medical History  Diagnosis Date  . Asthma   . GERD (gastroesophageal reflux disease)   . Hypertension   . Osteopenia   . Mitral valve prolapse   . Glaucoma suspect   . Sleep disturbance   . Allergy   . Depression   . Shingles  11/2006  . Diabetes mellitus 11/12    Past Surgical History  Procedure Date  . Abdominal hysterectomy 2000    /BSO - abn. Paps  . Appendectomy 1978  . Breast enhancement surgery 1987  . Bladder repair     x2  . Rectal prolapse repair     x1  . Cardiac catheterization   . Nm myoview ltd 03/2005    EF 56%, no ischemia  . Shoulder surgery 09/2008, 01/2009    Right shoulder surgery, then redone with lysis of adhesions  . Cervical discectomy 11/2009    with synthetic discs inserted--  Dr Lovell Sheehan    Family History  Problem Relation Age of Onset  . Diabetes Mother   . Heart disease Mother   . Hypertension Mother   . Ovarian cancer Mother   . Melanoma Mother   . Cancer Father   . Coronary artery disease Father   . Diabetes Sister   . Depression Sister   . Coronary artery disease Sister   . Rheum arthritis Maternal Aunt   . Breast cancer Maternal Grandmother   . Diabetes Maternal Grandmother   . Diabetes Maternal Grandfather   .  Colon cancer Neg Hx     History   Social History  . Marital Status: Married    Spouse Name: N/A    Number of Children: 5  . Years of Education: N/A   Occupational History  . homemaker    Social History Main Topics  . Smoking status: Never Smoker   . Smokeless tobacco: Never Used  . Alcohol Use: No  . Drug Use: Not on file  . Sexually Active: Not on file   Other Topics Concern  . Not on file   Social History Narrative   Home makerDoesn't exerciseSherry Talley's sister   Review of Systems Weight is down 3# since last visit Mood is okay    Objective:   Physical Exam  Constitutional: She appears well-developed and well-nourished. No distress.  HENT:  Mouth/Throat: Oropharynx is clear and moist. No oropharyngeal exudate.       Mild left frontal tenderness Marked nasal congestion but not overly inflamed (esp bad on right) TMs normal  Neck: Normal range of motion. Neck supple.  Pulmonary/Chest: Effort normal and breath sounds  normal. No respiratory distress. She has no wheezes. She has no rales.  Lymphadenopathy:    She has no cervical adenopathy.          Assessment & Plan:

## 2011-11-15 NOTE — Assessment & Plan Note (Signed)
Seems to be viral infection Discussed supportive care Would send Rx for amoxicillin if not better next week

## 2011-11-20 ENCOUNTER — Telehealth: Payer: Self-pay | Admitting: Internal Medicine

## 2011-11-20 MED ORDER — AMOXICILLIN 500 MG PO CAPS: 1000.0000 mg | ORAL_CAPSULE | Freq: Two times a day (BID) | ORAL | Status: DC

## 2011-11-20 NOTE — Telephone Encounter (Signed)
rx sent to pharmacy by e-script Spoke with patient and advised results   

## 2011-11-20 NOTE — Telephone Encounter (Signed)
Patient called and was seen on the 26th of December for a URI and states she is not better.  Her symptoms are sore throat, head congestion and her she is having sinus pressure, headache and productive cough with yellowish-greenish color.  Doesn't feel like it's in her chest but just around the throat area.

## 2011-11-20 NOTE — Telephone Encounter (Signed)
Okay to send Rx for amoxicillin 500mg #40 x 0 2 tabs bid for 10 days 

## 2011-11-21 ENCOUNTER — Ambulatory Visit: Payer: Self-pay | Admitting: Internal Medicine

## 2011-12-22 ENCOUNTER — Ambulatory Visit: Payer: Self-pay | Admitting: Internal Medicine

## 2011-12-25 ENCOUNTER — Telehealth: Payer: Self-pay | Admitting: Internal Medicine

## 2011-12-25 NOTE — Telephone Encounter (Signed)
Pt is calling about her test strips for Diabetes. She is taking classes to help with her diabetes and they are asking her to check more often with her test strips and she was wondering if Dr. Alphonsus Sias could up the number of test strips she gets ?

## 2011-12-26 MED ORDER — GLUCOSE BLOOD VI STRP: ORAL_STRIP | Status: DC

## 2011-12-26 NOTE — Telephone Encounter (Signed)
It would be okay to increase the frequency briefly, In general, insurance only pays for 1 test a day unless there is insulin use or extenuating circumstances. We can write for bid due to fluctuating sugars for the time being (1-2 months only probably)

## 2011-12-26 NOTE — Telephone Encounter (Signed)
Spoke with patient and advised results,rx sent to pharmacy by manual fax

## 2011-12-27 ENCOUNTER — Telehealth: Payer: Self-pay | Admitting: *Deleted

## 2011-12-27 MED ORDER — GLUCOSE BLOOD VI STRP: ORAL_STRIP | Status: DC

## 2011-12-27 NOTE — Telephone Encounter (Signed)
Spoke with patient and she's been using heat and it feels a little better, pt states she will call tomorrow morning if no better.

## 2011-12-27 NOTE — Telephone Encounter (Signed)
If she has ongoing pain, she should come here and not the diabetes class Check with her tomorrow I am not working on Friday this week either  She should see someone else here rather than going to urgent care also (where they don't have her records, etc) If she has fever or SOB---this needs attention today

## 2011-12-27 NOTE — Telephone Encounter (Signed)
Received fax from [facility] asking for the brand of test strips, spoke with patient and it's contour. rx sent to pharmacy by e-script   Also pt states she's having a sharp pain under her left beast and it gets worse when she breathes hard, this started on Tuesday morning, pt states it not her heart, feels like its her lungs. Offered her 1:45 appointment tomorrow but pt states she have diabetes class then, also offered appt with another provider and pt declined would like appt with Dr.Letvak so she can also discuss diabetes, offered later appt and per pt her class lasts until about 4:30pm? Pt states if it gets worse she will go to an urgent care. Please advise.

## 2011-12-27 NOTE — Telephone Encounter (Signed)
rx sent to pharmacy by e-script  

## 2011-12-28 NOTE — Telephone Encounter (Signed)
okay

## 2012-01-19 ENCOUNTER — Ambulatory Visit: Payer: Self-pay | Admitting: Internal Medicine

## 2012-01-28 ENCOUNTER — Other Ambulatory Visit: Payer: Self-pay | Admitting: Internal Medicine

## 2012-02-16 ENCOUNTER — Ambulatory Visit: Payer: 59 | Admitting: Internal Medicine

## 2012-02-20 ENCOUNTER — Encounter: Payer: Self-pay | Admitting: Internal Medicine

## 2012-02-20 ENCOUNTER — Ambulatory Visit (INDEPENDENT_AMBULATORY_CARE_PROVIDER_SITE_OTHER): Payer: 59 | Admitting: Internal Medicine

## 2012-02-20 VITALS — BP 112/78 | HR 83 | Temp 98.6°F | Ht 64.0 in | Wt 169.0 lb

## 2012-02-20 DIAGNOSIS — J309 Allergic rhinitis, unspecified: Secondary | ICD-10-CM

## 2012-02-20 DIAGNOSIS — E119 Type 2 diabetes mellitus without complications: Secondary | ICD-10-CM

## 2012-02-20 LAB — HEMOGLOBIN A1C: Hgb A1c MFr Bld: 6.6 % — ABNORMAL HIGH (ref 4.6–6.5)

## 2012-02-20 MED ORDER — METOPROLOL SUCCINATE ER 25 MG PO TB24: 12.5000 mg | ORAL_TABLET | Freq: Every day | ORAL | Status: DC

## 2012-02-20 NOTE — Assessment & Plan Note (Signed)
Seems to have much better control Will check A1c --if not under 7.5%, will increase the metformin

## 2012-02-20 NOTE — Progress Notes (Signed)
Subjective:    Patient ID: Shannon [name], female    DOB: 1955/12/29, 56 y.o.   MRN: [id number]  HPI Doing well Has gotten used to the diabetes thing Has been following diabetic diet Has been using elliptical some---still bothers knees  Checking sugars most mornings 112-151 but most under 140 Other random checks generally under 150 Had some mild sweating and trembling if she goes to 100 Had been often for a while but that seems to have settled down  Has sensation of something stuck in her throat Over the past month  Current Outpatient Prescriptions on File Prior to Visit  Medication Sig Dispense Refill  . aspirin 81 MG tablet Take 81 mg by mouth daily.        . bimatoprost (LUMIGAN) 0.03 % ophthalmic drops 1 drop at bedtime.        . cetirizine (ZYRTEC) 10 MG tablet Take 10 mg by mouth daily.        . Fluticasone-Salmeterol (ADVAIR DISKUS) 100-50 MCG/DOSE AEPB Inhale 1 puff into the lungs every 12 (twelve) hours.        Marland Kitchen glucose blood (BAYER CONTOUR NEXT TEST) test strip Use as instructed to test blood sugars 1-2 times daily, due to flutuating sugars patient must test more than once daily only for a brief trial.  100 each  1  . meloxicam (MOBIC) 7.5 MG tablet Take 7.5 mg by mouth daily.        . metoprolol succinate (TOPROL-XL) 25 MG 24 hr tablet Take 12.5 mg by mouth daily.        . pantoprazole (PROTONIX) 40 MG tablet Take 40 mg by mouth daily.        . traZODone (DESYREL) 100 MG tablet TAKE 2 TABLETS AT BEDTIME  180 tablet  2  . triamterene-hydrochlorothiazide (DYAZIDE) 37.5-25 MG per capsule Take 1 capsule by mouth every morning.          Allergies  Allergen Reactions  . Clarithromycin   . Lisinopril     REACTION: cough  . Nitrofurantoin   . Oxycodone-Acetaminophen     REACTION: itching    Past Medical History  Diagnosis Date  . Asthma   . GERD (gastroesophageal reflux disease)   . Hypertension   . Osteopenia   . Mitral valve prolapse   . Glaucoma suspect   .  Sleep disturbance   . Allergy   . Depression   . Shingles 11/2006  . Diabetes mellitus 11/12    Past Surgical History  Procedure Date  . Abdominal hysterectomy 2000    /BSO - abn. Paps  . Appendectomy 1978  . Breast enhancement surgery 1987  . Bladder repair     x2  . Rectal prolapse repair     x1  . Cardiac catheterization   . Nm myoview ltd 03/2005    EF 56%, no ischemia  . Shoulder surgery 09/2008, 01/2009    Right shoulder surgery, then redone with lysis of adhesions  . Cervical discectomy 11/2009    with synthetic discs inserted--  Dr Lovell Sheehan    Family History  Problem Relation Age of Onset  . Diabetes Mother   . Heart disease Mother   . Hypertension Mother   . Ovarian cancer Mother   . Melanoma Mother   . Cancer Father   . Coronary artery disease Father   . Diabetes Sister   . Depression Sister   . Coronary artery disease Sister   . Rheum arthritis Maternal Aunt   .  Breast cancer Maternal Grandmother   . Diabetes Maternal Grandmother   . Diabetes Maternal Grandfather   . Colon cancer Neg Hx     History   Social History  . Marital Status: Married    Spouse Name: N/A    Number of Children: 5  . Years of Education: N/A   Occupational History  . homemaker    Social History Main Topics  . Smoking status: Never Smoker   . Smokeless tobacco: Never Used  . Alcohol Use: No  . Drug Use: Not on file  . Sexually Active: Not on file   Other Topics Concern  . Not on file   Social History Narrative   Home makerDoesn't exerciseSherry Talley's sister   Review of Systems Weight down 2# Sleeping okay    Objective:   Physical Exam  Constitutional: She appears well-developed and well-nourished. No distress.  HENT:  Mouth/Throat: Oropharynx is clear and moist. No oropharyngeal exudate.  Neck: Normal range of motion. Neck supple. No thyromegaly present.  Lymphadenopathy:    She has no cervical adenopathy.  Psychiatric: She has a normal mood and affect.  Her behavior is normal.          Assessment & Plan:

## 2012-02-20 NOTE — Assessment & Plan Note (Signed)
Probably causing the throat symptoms Will have her increase the loratadine Consider montelukast

## 2012-02-23 ENCOUNTER — Encounter: Payer: Self-pay | Admitting: *Deleted

## 2012-03-06 ENCOUNTER — Encounter: Payer: 59 | Admitting: Internal Medicine

## 2012-04-13 ENCOUNTER — Other Ambulatory Visit: Payer: Self-pay | Admitting: Internal Medicine

## 2012-04-16 ENCOUNTER — Other Ambulatory Visit: Payer: Self-pay | Admitting: *Deleted

## 2012-04-16 ENCOUNTER — Other Ambulatory Visit: Payer: Self-pay

## 2012-04-16 MED ORDER — PANTOPRAZOLE SODIUM 40 MG PO TBEC: 40.0000 mg | DELAYED_RELEASE_TABLET | Freq: Every day | ORAL | Status: DC

## 2012-04-16 MED ORDER — GLUCOSE BLOOD VI STRP: ORAL_STRIP | Status: DC

## 2012-04-16 NOTE — Telephone Encounter (Signed)
Pt request test strips # 100 x 3 [facility] [address] and Protonix # 90 x 3 to [facility]. Pt notified done while on phone

## 2012-07-23 ENCOUNTER — Telehealth: Payer: Self-pay | Admitting: Internal Medicine

## 2012-07-23 MED ORDER — FLUTICASONE-SALMETEROL 100-50 MCG/DOSE IN AEPB: 1.0000 | INHALATION_SPRAY | Freq: Two times a day (BID) | RESPIRATORY_TRACT | Status: DC

## 2012-07-23 NOTE — Telephone Encounter (Signed)
Spoke with patient and advised results rx sent to pharmacy by e-script  

## 2012-07-23 NOTE — Telephone Encounter (Signed)
Caller: Shannon [name]/Patient; Phone: [phone]; Reason for Call: Please call pt to advise if office has any samples of Advair 100/50; she has been out for over 2 weeks and cannot afford to buy.

## 2012-08-30 ENCOUNTER — Telehealth: Payer: Self-pay | Admitting: Internal Medicine

## 2012-08-30 NOTE — Telephone Encounter (Signed)
Patient states she is having allergy testing done at [facility] and must be off her trazadone for 2 nights prior to the testing. She would like to know what we can call in for her to take -does not want Ambien- and also call the ENT and let them know what we are prescribing her as a temporary replacement. [facility] uses [facility] [address]

## 2012-09-01 NOTE — Telephone Encounter (Signed)
Okay to send Rx for temazepam 15mg  1-2 at bedtime prn #10 x 0  I would also recommend OTC melatonin ---  3-5 mg at bedtime. Might be better to try this first (no dependence potential)

## 2012-09-02 MED ORDER — TEMAZEPAM 15 MG PO CAPS: 15.0000 mg | ORAL_CAPSULE | Freq: Every evening | ORAL | Status: DC | PRN

## 2012-09-02 NOTE — Telephone Encounter (Signed)
rx called into pharmacy Left message on machine that rx was [facility]

## 2012-09-16 ENCOUNTER — Ambulatory Visit (INDEPENDENT_AMBULATORY_CARE_PROVIDER_SITE_OTHER): Payer: 59 | Admitting: Internal Medicine

## 2012-09-16 ENCOUNTER — Encounter: Payer: Self-pay | Admitting: Internal Medicine

## 2012-09-16 VITALS — BP 138/80 | HR 86 | Temp 97.5°F | Ht 64.0 in | Wt 169.0 lb

## 2012-09-16 DIAGNOSIS — Z1231 Encounter for screening mammogram for malignant neoplasm of breast: Secondary | ICD-10-CM

## 2012-09-16 DIAGNOSIS — G479 Sleep disorder, unspecified: Secondary | ICD-10-CM

## 2012-09-16 DIAGNOSIS — E1169 Type 2 diabetes mellitus with other specified complication: Secondary | ICD-10-CM | POA: Insufficient documentation

## 2012-09-16 DIAGNOSIS — J45909 Unspecified asthma, uncomplicated: Secondary | ICD-10-CM

## 2012-09-16 DIAGNOSIS — E119 Type 2 diabetes mellitus without complications: Secondary | ICD-10-CM

## 2012-09-16 DIAGNOSIS — Z0000 Encounter for general adult medical examination without abnormal findings: Secondary | ICD-10-CM | POA: Insufficient documentation

## 2012-09-16 DIAGNOSIS — R0789 Other chest pain: Secondary | ICD-10-CM

## 2012-09-16 DIAGNOSIS — E785 Hyperlipidemia, unspecified: Secondary | ICD-10-CM | POA: Insufficient documentation

## 2012-09-16 DIAGNOSIS — Z23 Encounter for immunization: Secondary | ICD-10-CM

## 2012-09-16 DIAGNOSIS — E782 Mixed hyperlipidemia: Secondary | ICD-10-CM | POA: Insufficient documentation

## 2012-09-16 DIAGNOSIS — I10 Essential (primary) hypertension: Secondary | ICD-10-CM

## 2012-09-16 LAB — HEPATIC FUNCTION PANEL
ALT: 32 U/L (ref 0–35)
Alkaline Phosphatase: 57 U/L (ref 39–117)
Bilirubin, Direct: 0 mg/dL (ref 0.0–0.3)
Total Protein: 7.2 g/dL (ref 6.0–8.3)

## 2012-09-16 LAB — BASIC METABOLIC PANEL
BUN: 13 mg/dL (ref 6–23)
CO2: 29 mEq/L (ref 19–32)
Chloride: 103 mEq/L (ref 96–112)
Creatinine, Ser: 0.9 mg/dL (ref 0.4–1.2)
Glucose, Bld: 107 mg/dL — ABNORMAL HIGH (ref 70–99)
Potassium: 4.6 mEq/L (ref 3.5–5.1)

## 2012-09-16 LAB — CBC WITH DIFFERENTIAL/PLATELET
Basophils Relative: 0.3 % (ref 0.0–3.0)
Eosinophils Absolute: 0.2 10*3/uL (ref 0.0–0.7)
Eosinophils Relative: 2.8 % (ref 0.0–5.0)
HCT: 40.7 % (ref 36.0–46.0)
Lymphs Abs: 2.4 10*3/uL (ref 0.7–4.0)
MCHC: 32.7 g/dL (ref 30.0–36.0)
MCV: 90.1 fl (ref 78.0–100.0)
Monocytes Absolute: 0.7 10*3/uL (ref 0.1–1.0)
Neutrophils Relative %: 60.9 % (ref 43.0–77.0)
Platelets: 252 10*3/uL (ref 150.0–400.0)

## 2012-09-16 LAB — HEMOGLOBIN A1C: Hgb A1c MFr Bld: 6.5 % (ref 4.6–6.5)

## 2012-09-16 LAB — LIPID PANEL
Cholesterol: 244 mg/dL — ABNORMAL HIGH (ref 0–200)
HDL: 52.1 mg/dL (ref >39.00)
Triglycerides: 239 mg/dL — ABNORMAL HIGH (ref 0.0–149.0)
VLDL: 47.8 mg/dL — ABNORMAL HIGH (ref 0.0–40.0)

## 2012-09-16 LAB — TSH: TSH: 0.63 u[IU]/mL (ref 0.35–5.50)

## 2012-09-16 MED ORDER — ALBUTEROL SULFATE HFA 108 (90 BASE) MCG/ACT IN AERS: 2.0000 | INHALATION_SPRAY | Freq: Four times a day (QID) | RESPIRATORY_TRACT | Status: DC | PRN

## 2012-09-16 NOTE — Progress Notes (Signed)
Subjective:    Patient ID: Shannon [name], female    DOB: 1956-09-22, 56 y.o.   MRN: [id number]  HPI Here for physical  Has had sore area on left forefoot 2 days ago No known injury Constant pain---worse with walking  Has slacked off on checking sugars Had still been good No severe hypoglycemic reactions---only once in a while  Does have CAD in family Dad's first MI at 6 and had carotid disease Sister has vascular disease  Stopped advair due to cost Doesn't have albuterol inhaler either  Current Outpatient Prescriptions on File Prior to Visit  Medication Sig Dispense Refill  . aspirin 81 MG tablet Take 81 mg by mouth daily.        . bimatoprost (LUMIGAN) 0.03 % ophthalmic drops 1 drop at bedtime.        Marland Kitchen glucose blood (BAYER CONTOUR NEXT TEST) test strip Check blood sugar once daily and as needed. 250.00  100 each  3  . loratadine (CLARITIN) 10 MG tablet Take 10-20 mg by mouth daily as needed. For allergies      . meloxicam (MOBIC) 7.5 MG tablet Take 7.5 mg by mouth daily.        . metFORMIN (GLUCOPHAGE) 500 MG tablet Take 500 mg by mouth 2 (two) times daily with a meal.      . metoprolol succinate (TOPROL-XL) 25 MG 24 hr tablet Take 0.5 tablets (12.5 mg total) by mouth daily.  45 tablet  3  . pantoprazole (PROTONIX) 40 MG tablet Take 1 tablet (40 mg total) by mouth daily.  90 tablet  3  . QNASL 80 MCG/ACT AERS Place 1 spray into both nostrils 2 (two) times daily.       . traZODone (DESYREL) 100 MG tablet TAKE 2 TABLETS AT BEDTIME  180 tablet  2  . triamterene-hydrochlorothiazide (MAXZIDE-25) 37.5-25 MG per tablet TAKE ONE TABLET BY MOUTH EVERY DAY  90 tablet  3  . DISCONTD: triamterene-hydrochlorothiazide (DYAZIDE) 37.5-25 MG per capsule Take 1 capsule by mouth every morning.          Allergies  Allergen Reactions  . Clarithromycin   . Lisinopril     REACTION: cough  . Nitrofurantoin   . Oxycodone-Acetaminophen     REACTION: itching    Past Medical History    Diagnosis Date  . Asthma   . GERD (gastroesophageal reflux disease)   . Hypertension   . Osteopenia   . Mitral valve prolapse   . Glaucoma suspect   . Sleep disturbance   . Allergy   . Depression   . Shingles 11/2006  . Diabetes mellitus 11/12    Past Surgical History  Procedure Date  . Abdominal hysterectomy 2000    /BSO - abn. Paps  . Appendectomy 1978  . Breast enhancement surgery 1987  . Bladder repair     x2  . Rectal prolapse repair     x1  . Cardiac catheterization   . Nm myoview ltd 03/2005    EF 56%, no ischemia  . Shoulder surgery 09/2008, 01/2009    Right shoulder surgery, then redone with lysis of adhesions  . Cervical discectomy 11/2009    with synthetic discs inserted--  Dr Lovell Sheehan    Family History  Problem Relation Age of Onset  . Diabetes Mother   . Heart disease Mother   . Hypertension Mother   . Ovarian cancer Mother   . Melanoma Mother   . Cancer Father   . Coronary artery disease  Father   . Diabetes Sister   . Depression Sister   . Coronary artery disease Sister   . Rheum arthritis Maternal Aunt   . Breast cancer Maternal Grandmother   . Diabetes Maternal Grandmother   . Diabetes Maternal Grandfather   . Colon cancer Neg Hx     History   Social History  . Marital Status: Married    Spouse Name: N/A    Number of Children: 5  . Years of Education: N/A   Occupational History  . homemaker    Social History Main Topics  . Smoking status: Never Smoker   . Smokeless tobacco: Never Used  . Alcohol Use: No  . Drug Use: Not on file  . Sexually Active: Not on file   Other Topics Concern  . Not on file   Social History Narrative   Home makerDoesn't exerciseSherry Talley's sister   Review of Systems  Constitutional: Negative for fatigue and unexpected weight change.       Wears seat belt  HENT: Positive for hearing loss, congestion and rhinorrhea. Negative for tinnitus.        Mild hearing problems Regular with dentist On  nasal steroid now due to allergy problems--- Dr Jenne Campus  Eyes: Negative for visual disturbance.       Pressures have been up some---watching closer now No vision loss  Respiratory: Positive for chest tightness and shortness of breath. Negative for cough.        Some DOE Has not been doing much exercise  Cardiovascular: Positive for chest pain and palpitations.       Gets occ heavy feeling in chest or shooting pains May occur with walking fast Occ skipped beats---relates to MVP  Gastrointestinal: Positive for nausea. Negative for abdominal pain, constipation and blood in stool.       No heartburn Occ nausea after nausea---uses tums or rolaids with success (once a week or so) Still on protonix daily  Genitourinary: Positive for urgency, frequency and dyspareunia.       Some increased nocturia Bladder seems to have dropped again No incontinence or slight dribble    Musculoskeletal: Positive for back pain and arthralgias. Negative for joint swelling.       Meloxicam does help her pain  Skin: Negative for rash.       No suspicious lesions  Neurological: Positive for headaches. Negative for dizziness, syncope, weakness, light-headedness and numbness.       Headaches might be allergy related  Hematological: Negative for adenopathy. Does not bruise/bleed easily.  Psychiatric/Behavioral: Positive for disturbed wake/sleep cycle. Negative for dysphoric mood. The patient is not nervous/anxious.        Had been sleeping great---but short time of increasing awakening        Objective:   Physical Exam  Constitutional: She is oriented to person, place, and time. She appears well-developed and well-nourished. No distress.  HENT:  Head: Normocephalic and atraumatic.  Right Ear: External ear normal.  Left Ear: External ear normal.  Mouth/Throat: Oropharynx is clear and moist. No oropharyngeal exudate.  Eyes: Conjunctivae normal and EOM are normal. Pupils are equal, round, and reactive to  light.  Neck: Normal range of motion. Neck supple. No thyromegaly present.  Cardiovascular: Normal rate, regular rhythm, normal heart sounds and intact distal pulses.  Exam reveals no gallop.   No murmur heard. Pulmonary/Chest: Effort normal and breath sounds normal. No respiratory distress. She has no wheezes. She has no rales.  Abdominal: Soft. She exhibits no distension.  There is no rebound and no guarding.       Slight tenderness--generalized  Genitourinary:       No breast  Masses Implants in place medially on each side  Musculoskeletal: She exhibits no edema and no tenderness.  Lymphadenopathy:    She has no cervical adenopathy.    She has no axillary adenopathy.  Neurological: She is alert and oriented to person, place, and time.  Skin: No rash noted. No erythema.  Psychiatric: She has a normal mood and affect. Her behavior is normal. Thought content normal.          Assessment & Plan:

## 2012-09-16 NOTE — Assessment & Plan Note (Signed)
New issue  I suspect this is asthma related as she stopped her advair EKG is normal---reassuring She will monitor this and try albuterol

## 2012-09-16 NOTE — Addendum Note (Signed)
Addended by: Sueanne Margarita on: 09/16/2012 03:18 PM   Modules accepted: Orders

## 2012-09-16 NOTE — Assessment & Plan Note (Signed)
Discussed Rx in view of DM and family history If LDL still over 100, will start atorvastatin

## 2012-09-16 NOTE — Assessment & Plan Note (Signed)
Try albuterol MDI If needs a lot, will restart an inhaled steroid

## 2012-09-16 NOTE — Assessment & Plan Note (Signed)
Had bad spell but slowly improving again

## 2012-09-16 NOTE — Assessment & Plan Note (Signed)
Generally doing well Due for mammogram Pneumovax and flu shots today

## 2012-09-16 NOTE — Assessment & Plan Note (Signed)
BP Readings from Last 3 Encounters:  09/16/12 138/80  02/20/12 112/78  11/15/11 120/80   Still good control No changes needed

## 2012-09-16 NOTE — Assessment & Plan Note (Signed)
Lab Results  Component Value Date   HGBA1C 6.6* 02/20/2012   Did get back under control Will check labs

## 2012-09-19 ENCOUNTER — Encounter: Payer: Self-pay | Admitting: *Deleted

## 2012-09-19 ENCOUNTER — Other Ambulatory Visit: Payer: Self-pay | Admitting: *Deleted

## 2012-09-19 MED ORDER — ATORVASTATIN CALCIUM 20 MG PO TABS: 20.0000 mg | ORAL_TABLET | Freq: Every day | ORAL | Status: DC

## 2012-10-07 ENCOUNTER — Telehealth: Payer: Self-pay | Admitting: Internal Medicine

## 2012-10-07 DIAGNOSIS — N644 Mastodynia: Secondary | ICD-10-CM

## 2012-10-07 NOTE — Telephone Encounter (Signed)
Patient called here to tell you she is having left breast pain at the 2:00 position as well as breast implants. Please place a diagnostic bilateral mammogram with left breast US if needed. She wants to go to [facility] and call her back at [phone]. They will not do a screening mammogram if the patient is having breast pain! Please cancel the screening MMG order and place the diagnostic one and also pls make note of the location of the patients breast pain on the order.

## 2012-10-07 NOTE — Telephone Encounter (Signed)
Order revised.

## 2012-10-15 ENCOUNTER — Ambulatory Visit: Payer: Self-pay | Admitting: Internal Medicine

## 2012-10-16 ENCOUNTER — Encounter: Payer: Self-pay | Admitting: Internal Medicine

## 2012-10-20 ENCOUNTER — Other Ambulatory Visit: Payer: Self-pay | Admitting: Internal Medicine

## 2012-10-29 ENCOUNTER — Other Ambulatory Visit (INDEPENDENT_AMBULATORY_CARE_PROVIDER_SITE_OTHER): Payer: 59

## 2012-10-29 ENCOUNTER — Telehealth: Payer: Self-pay

## 2012-10-29 DIAGNOSIS — E785 Hyperlipidemia, unspecified: Secondary | ICD-10-CM

## 2012-10-29 LAB — HEPATIC FUNCTION PANEL
ALT: 30 U/L (ref 0–35)
Total Bilirubin: 0.4 mg/dL (ref 0.3–1.2)
Total Protein: 7.2 g/dL (ref 6.0–8.3)

## 2012-10-29 LAB — LIPID PANEL
Cholesterol: 147 mg/dL (ref 0–200)
LDL Cholesterol: 63 mg/dL (ref 0–99)
Triglycerides: 184 mg/dL — ABNORMAL HIGH (ref 0.0–149.0)

## 2012-10-29 MED ORDER — TRAMADOL HCL 50 MG PO TABS: 50.0000 mg | ORAL_TABLET | Freq: Two times a day (BID) | ORAL | Status: DC | PRN

## 2012-10-29 NOTE — Telephone Encounter (Signed)
Medication phoned to [facility] [address] [facility] as instructed.Patient notified as instructed by telephone.

## 2012-10-29 NOTE — Telephone Encounter (Signed)
Okay to send rx for tramadol 50mg  Bid prn #60 x 0

## 2012-10-29 NOTE — Telephone Encounter (Signed)
Pt left note requesting pain med for arthritis. Dr Gavin Potters had given Tramadol 50 mg in past.  Pt wanted to know if Dr Alphonsus Sias could prescribe pain med or does pt have to contact Dr Gavin Potters. The colder weather has caused more arthritic pain.[facility] [address]Please advise.

## 2012-12-24 ENCOUNTER — Other Ambulatory Visit: Payer: Self-pay | Admitting: Internal Medicine

## 2012-12-26 ENCOUNTER — Telehealth: Payer: Self-pay | Admitting: Internal Medicine

## 2012-12-26 ENCOUNTER — Emergency Department: Payer: Self-pay | Admitting: Emergency Medicine

## 2012-12-26 NOTE — Telephone Encounter (Signed)
Patient Information:  Caller Name: Shannon [name]  Phone: [phone]  Patient: Shannon [name], Shannon [name]  Gender: Female  DOB: November 23, 1955  Age: 57 Years  PCP: [facility])  Pregnant: No  Office Follow Up:  Does the office need to follow up with this patient?: No  Instructions For The Office: N/A  RN Note:  Hysterectomy. Called regarding increased frequency of nausea so it is occurring daily. Nausea is worse after eating or from 1500-1700.  Also notes "loose stools" with foul odor "for a long time" (approximately 5-6 months.) Has 3-5 stools in the morning and 1-2 in the afternoon. Fasting blood sugar 230 a few weeks ago.  Random blood sugar 132 at 17:00; ate 2 hours ago. Reports increased frequency of hypogylcemic episodes related to skipping breakfast due to nausea. Substernal and Left sided chest "pressure" present now, current pain rated 4/10. Feels the pain is "gas" that she has felt so many times in the past. Declined 911 but agreed to go to ED now to get checked out.   Symptoms  Reason For Call & Symptoms: Daily nausea, especially in late afternoon aprroximately 1600-1700. Asking if needs reeferral to GI.  Fasting blood sugar not tested for past 3-4 days.  Reviewed Health History In EMR: Yes  Reviewed Medications In EMR: Yes  Reviewed Allergies In EMR: Yes  Reviewed Surgeries / Procedures: Yes  Date of Onset of Symptoms: 09/16/2012  Treatments Tried: ginger ale, otc dramamine, Zofran  Treatments Tried Worked: Yes OB / GYN:  LMP: Unknown  Guideline(s) Used:  Diarrhea  Vomiting  Chest Pain  Disposition Per Guideline:   Call EMS 911 Now  Reason For Disposition Reached:   Chest pain lasting longer than 5 minutes and ANY of the following:  Over 44 years old Over 53 years old and at least one cardiac risk factor (i.e., high blood pressure, diabetes, high cholesterol, obesity, smoker or strong family history of heart disease) Pain is crushing, pressure-like, or heavy   Took nitroglycerin and chest pain was not relieved History of heart disease (i.e., angina, heart attack, bypass surgery, angioplasty, CHF)  Advice Given:  Fluids:  Drink more fluids, at least 8-10 glasses (8 oz or 240 ml) daily.  Fluids:  Drink more fluids, at least 8-10 glasses (8 oz or 240 ml) daily.  Supplement this with saltine crackers or soups to make certain that you are getting sufficient fluid and salt to meet your body's needs.  Nutrition:  Ideal initial foods include boiled starches/cereals (e.g., potatoes, rice, noodles, wheat, oats) with a small amount of salt to taste.  Other acceptable foods include: bananas, yogurt, crackers, soup.  Call Back If:  Signs of dehydration occur (e.g., no urine for more than 12 hours, very dry mouth, lightheaded, etc.)  Diarrhea lasts over 7 days  You become worse.  RN Overrode Recommendation:  Go To ED  Will go to ED now.

## 2012-12-27 LAB — CK TOTAL AND CKMB (NOT AT ARMC)
CK, Total: 71 U/L (ref 21–215)
CK-MB: <0.5 ng/mL — ABNORMAL LOW (ref 0.5–3.6)

## 2012-12-30 NOTE — Telephone Encounter (Signed)
Patient went to [facility], did ultrasound and CT of bladder, pt states her heart was fine, pt was told to follow-up with GI, pt stated she was at the ED for 14 1/2 hours and didn't think it was necessary, pt states she was pressured into going to the ED by the calling service. Pt has appt to see Dr. Selena Batten, Dr. Earnest Conroy assistant. Please advise

## 2012-12-30 NOTE — Telephone Encounter (Signed)
Please check on her

## 2012-12-31 MED ORDER — METFORMIN HCL ER 500 MG PO TB24: 1000.0000 mg | ORAL_TABLET | Freq: Every day | ORAL | Status: DC

## 2012-12-31 NOTE — Telephone Encounter (Signed)
rx sent to pharmacy by e-script Spoke with patient and advised results   

## 2012-12-31 NOTE — Telephone Encounter (Signed)
It definitely could be from the metformin Please change her to the metformin XR 500mg  Start at 1 tab before breakfast and if no problems, increase to 2 tabs before breakfast #60 x  1 year

## 2012-12-31 NOTE — Telephone Encounter (Signed)
Called and spoke with patient and she understands. Pt also wanted to know if the metformin has anything to due with her nausea and diarrhea?  Per pt the nausea comes and goes but she had to go off the metformin for 48 hours because of a test they did and she didn't have nausea or diarrhea. She's restarted the metformin and the nausea and diarrhea have started to come back. Please advise

## 2012-12-31 NOTE — Telephone Encounter (Signed)
Please apologize to her but it is common for women with a heart attack to think they are just having indigestion. This is why they were pushing you to have ER evaluation  Adrienne, Can you look into her concerns?

## 2013-01-08 ENCOUNTER — Other Ambulatory Visit: Payer: Self-pay

## 2013-01-08 MED ORDER — TRAZODONE HCL 100 MG PO TABS: ORAL_TABLET | ORAL | Status: DC

## 2013-01-08 NOTE — Telephone Encounter (Signed)
rx sent to pharmacy by e-script Spoke with patient and advised results   

## 2013-01-08 NOTE — Telephone Encounter (Signed)
Okay to send a year to [facility] and 1 month locally

## 2013-01-08 NOTE — Telephone Encounter (Signed)
Pt request refill trazodone to [facility] but pt request one month supply sent to [facility] [address]. Pt almost out of med. Pt request call back when refills done.

## 2013-01-13 ENCOUNTER — Ambulatory Visit: Payer: Self-pay | Admitting: Unknown Physician Specialty

## 2013-01-15 LAB — IFOBT (OCCULT BLOOD): IFOBT: NEGATIVE

## 2013-01-17 ENCOUNTER — Other Ambulatory Visit: Payer: Self-pay | Admitting: Internal Medicine

## 2013-01-20 ENCOUNTER — Ambulatory Visit: Payer: 59 | Admitting: Internal Medicine

## 2013-01-22 ENCOUNTER — Ambulatory Visit (INDEPENDENT_AMBULATORY_CARE_PROVIDER_SITE_OTHER): Payer: 59 | Admitting: Internal Medicine

## 2013-01-22 ENCOUNTER — Encounter: Payer: Self-pay | Admitting: Internal Medicine

## 2013-01-22 VITALS — BP 122/80 | HR 84 | Temp 97.9°F | Wt 169.0 lb

## 2013-01-22 DIAGNOSIS — K219 Gastro-esophageal reflux disease without esophagitis: Secondary | ICD-10-CM

## 2013-01-22 DIAGNOSIS — R0789 Other chest pain: Secondary | ICD-10-CM

## 2013-01-22 DIAGNOSIS — I059 Rheumatic mitral valve disease, unspecified: Secondary | ICD-10-CM

## 2013-01-22 MED ORDER — TRIAMTERENE-HCTZ 37.5-25 MG PO TABS: 1.0000 | ORAL_TABLET | Freq: Every day | ORAL | Status: DC

## 2013-01-22 NOTE — Progress Notes (Signed)
Subjective:    Patient ID: Shannon [name], female    DOB: July 21, 1956, 57 y.o.   MRN: [id number]  HPI Reviewed notes from [facility] Awoke last week with heart going fast and skipping beats Has had this before but usually briefly This time may have lasted 2 hours--but then went away Now with some tightness-- can occur with activity or at rest Generally keeps up with activity but was worse yesterday and she had to stop Tightness subxiphoid and left of sternum Some right arm aching last night also No dyspnea with this Does get nausea at times---similar to when she had ER visit (ultrasound and HIDA negative)  Nausea now better since change to metformin ER---easing away and mostly better  No foot or ankle swelling No dizziness or syncope Palpitations go back many years---had been better in past years  Current Outpatient Prescriptions on File Prior to Visit  Medication Sig Dispense Refill  . albuterol (PROVENTIL HFA;VENTOLIN HFA) 108 (90 BASE) MCG/ACT inhaler Inhale 2 puffs into the lungs every 6 (six) hours as needed for wheezing.  1 Inhaler  2  . aspirin 81 MG tablet Take 81 mg by mouth daily.        Marland Kitchen atorvastatin (LIPITOR) 20 MG tablet Take 1 tablet (20 mg total) by mouth daily.  90 tablet  3  . bimatoprost (LUMIGAN) 0.03 % ophthalmic drops 1 drop at bedtime.        Marland Kitchen glucose blood (BAYER CONTOUR NEXT TEST) test strip Check blood sugar once daily and as needed. 250.00  100 each  3  . meloxicam (MOBIC) 7.5 MG tablet Take 7.5 mg by mouth daily.        . metFORMIN (GLUCOPHAGE-XR) 500 MG 24 hr tablet Take 2 tablets (1,000 mg total) by mouth daily with breakfast.  60 tablet  11  . metoprolol succinate (TOPROL-XL) 25 MG 24 hr tablet Take one-half (1/2) tablet  daily  45 tablet  3  . pantoprazole (PROTONIX) 40 MG tablet Take 1 tablet by mouth  daily  90 tablet  3  . traMADol (ULTRAM) 50 MG tablet Take 1 tablet (50 mg total) by mouth 2 (two) times daily as needed.  60 tablet  0  . traZODone  (DESYREL) 100 MG tablet TAKE 2 TABLETS AT BEDTIME  30 tablet  0   No current facility-administered medications on file prior to visit.    Allergies  Allergen Reactions  . Clarithromycin   . Lisinopril     REACTION: cough  . Nitrofurantoin   . Oxycodone-Acetaminophen     REACTION: itching    Past Medical History  Diagnosis Date  . Asthma   . GERD (gastroesophageal reflux disease)   . Hypertension   . Osteopenia   . Mitral valve prolapse   . Glaucoma suspect   . Sleep disturbance   . Allergy   . Depression   . Shingles 11/2006  . Diabetes mellitus 11/12  . Hyperlipidemia     Past Surgical History  Procedure Laterality Date  . Abdominal hysterectomy  2000    /BSO - abn. Paps  . Appendectomy  1978  . Breast enhancement surgery  1987  . Bladder repair      x2  . Rectal prolapse repair      x1  . Cardiac catheterization    . Nm myoview ltd  03/2005    EF 56%, no ischemia  . Shoulder surgery  09/2008, 01/2009    Right shoulder surgery, then redone with lysis  of adhesions  . Cervical discectomy  11/2009    with synthetic discs inserted--  Dr Lovell Sheehan    Family History  Problem Relation Age of Onset  . Diabetes Mother   . Heart disease Mother   . Hypertension Mother   . Ovarian cancer Mother   . Melanoma Mother   . Cancer Father   . Coronary artery disease Father   . Diabetes Sister   . Depression Sister   . Coronary artery disease Sister   . Rheum arthritis Maternal Aunt   . Breast cancer Maternal Grandmother   . Diabetes Maternal Grandmother   . Diabetes Maternal Grandfather   . Colon cancer Neg Hx     History   Social History  . Marital Status: Married    Spouse Name: N/A    Number of Children: 5  . Years of Education: N/A   Occupational History  . homemaker    Social History Main Topics  . Smoking status: Never Smoker   . Smokeless tobacco: Never Used  . Alcohol Use: No  . Drug Use: Not on file  . Sexually Active: Not on file   Other  Topics Concern  . Not on file   Social History Narrative   Home maker   Doesn't exercise      Skipper Cliche sister         Review of Systems Appetite is okay Sleeps fairly well--no sig daytime somenolence Only coffee (2 cups) in AM---rare diet drink with caffeine otherwise    Objective:   Physical Exam  Constitutional: She appears well-developed and well-nourished. No distress.  Neck: Normal range of motion. Neck supple. No thyromegaly present.  Cardiovascular: Normal rate, regular rhythm and normal heart sounds.  Exam reveals no gallop.   No murmur heard. Pulmonary/Chest: Effort normal and breath sounds normal. No respiratory distress. She has no wheezes. She has no rales.  Abdominal: Soft. She exhibits no distension. There is no tenderness. There is no rebound.  Musculoskeletal: She exhibits no edema and no tenderness.  Lymphadenopathy:    She has no cervical adenopathy.  Psychiatric: She has a normal mood and affect. Her behavior is normal.          Assessment & Plan:

## 2013-01-22 NOTE — Assessment & Plan Note (Signed)
Has had recurrent spells of atypical chest discomfort EKGs have been normal Will set up exercise stress test

## 2013-01-22 NOTE — Assessment & Plan Note (Signed)
Known diagnosis but no signs on auscultation Not sure if this could be reason for spells of palpitations Will set up echo also to evaluate

## 2013-01-22 NOTE — Assessment & Plan Note (Addendum)
Ongoing problems that may be GI Will be getting endoscopy when cardiology testing is done  I have asked her to try without the atorvastatin for a while to see if that affects her symptoms

## 2013-02-04 ENCOUNTER — Ambulatory Visit (INDEPENDENT_AMBULATORY_CARE_PROVIDER_SITE_OTHER): Payer: 59 | Admitting: Cardiovascular Disease

## 2013-02-04 ENCOUNTER — Encounter: Payer: Self-pay | Admitting: Cardiovascular Disease

## 2013-02-04 VITALS — BP 122/80 | HR 95 | Ht 64.0 in | Wt 170.5 lb

## 2013-02-04 DIAGNOSIS — R0789 Other chest pain: Secondary | ICD-10-CM

## 2013-02-04 MED ORDER — METOPROLOL SUCCINATE ER 25 MG PO TB24: 25.0000 mg | ORAL_TABLET | Freq: Every day | ORAL | Status: DC

## 2013-02-04 NOTE — Progress Notes (Signed)
     Exercise Treadmill Test  Pre-Exercise Testing Evaluation Rhythm: normal sinus  Rate: 96   PR:   QRS:            Test  Exercise Tolerance Test Ordering MD: Kristeen Miss, MD  Interpreting MD: Kristeen Miss, MD  Unique Test No:  Treadmill:  1  Indication for ETT: chest pain - rule out ischemia , palpitations Contraindication to ETT: No   Stress Modality: exercise - treadmill  Cardiac Imaging Performed: non   Protocol: standard Bruce - maximal  Max BP: 178/78  Max MPHR (bpm):  165 85% MPR (bpm):  141  MPHR obtained (bpm):  175 % MPHR obtained:  106%   Reached 85% MPHR (min:sec):  2:50 Total Exercise Time (min-sec):  8:00  Workload in METS:  10 Borg Scale:   Reason ETT Terminated:  patient's desire to stop    ST Segment Analysis At Rest: normal ST segments - no evidence of significant ST depression With Exercise: no evidence of significant ST depression  Other Information Arrhythmia:  No Angina during ETT:  absent (0) Quality of ETT:  diagnostic  ETT Interpretation:  normal - no evidence of ischemia by ST analysis  Comments: [name] has good exercise capacity.  There is no evidence of ischemia.  Her resting HR is a bit fast.   Recommendations:  I have recommended that she increase her Toprol to 25 mg a day ( from 12. 5 mg a day).    She has an echo scheduled for 3/20.  We will call her with those findings.    I will see her on as needed basis.   Vesta Mixer, Montez Hageman., MD, [facility] 02/04/2013, 12:16 PM Office - [phone] Pager [phone]

## 2013-02-04 NOTE — Patient Instructions (Addendum)
Your physician has recommended you make the following change in your medication:  -increase toprol to 1 tablet daily  Your physician wants you to follow-up in: as needed. You will receive a reminder letter in the mail two months in advance. If you don't receive a letter, please call our office to schedule the follow-up appointment.  We will call you with results of echocardiogram

## 2013-02-04 NOTE — Procedures (Deleted)
Exercise Treadmill Test  Pre-Exercise Testing Evaluation Rhythm: {CHL RHYTHM BASELINE EKG FOR ETT:21021046}  Rate: {CHL RATE BASELINE EKG FOR ETT:21021048}   PR:  {CHL PR BASELINE EKG FOR ETT:21021049} QRS:  {CHL QRS BASELINE EKG FOR ETT:21021050}  QT:  {CHL QT BASELINE EKG FOR ETT:21021051} QTc: {CHL QTC BASELINE EKG FOR ETT:21021052}   P axis: {CHL AXIS BASELINE EKG FOR ETT:21021053}  QRS axis:  {CHL AXIS BASELINE EKG FOR ETT:21021053}  ST Segments:  {CHL ST SEGMENTS BASELINE EKG FOR ETT:21021054}     Test  Exercise Tolerance Test Ordering MD: {CHL LB CARDIOLOGY MD FOR ETT:21021055}  Interpreting MD: {CHL LB CARDIOLOGY INTERPRET MD FOR ETT:21020517}  Unique Test No: ***  Treadmill:  {CHL TREADMILL # FOR ETT:21021057}  Indication for ETT: {CHL INDICATION FOR ETT:21021058}  Contraindication to ETT: {CHL CONTRAINDICATION TO ETT:21021059}   Stress Modality: {CHL STRESS MODALITY FOR ETT:21021060}  Cardiac Imaging Performed: {CHL CARDIAC IMAGING PERFORMED FOR ETT:21021063}   Protocol: {CHL PROTOCOL FOR ETT:21021061}  Max BP:  ***/***  Max MPHR (bpm):  *** 85% MPR (bpm):  ***  MPHR obtained (bpm):  *** % MPHR obtained:  ***  Reached 85% MPHR (min:sec):  *** Total Exercise Time (min-sec):  ***  Workload in METS:  *** Borg Scale: ***  Reason ETT Terminated:  {CHL REASON TERMINATED FOR ETT:21021064}    ST Segment Analysis At Rest: {CHL ST SEGMENT AT REST FOR ETT:21021065} With Exercise: {CHL ST SEGMENT WITH EXERCISE FOR ETT:21021066}  Other Information Arrhythmia:  {CHL ARRHYTHMIA FOR ETT:21021070} Angina during ETT:  {CHL ANGINA DURING ETT:21021071} Quality of ETT:  {CHL QUALITY OF ETT:21021072}  ETT Interpretation:  {CHL INTERPRETATION FOR ETT:21021073}  Comments: ***  Recommendations: ***   

## 2013-02-06 ENCOUNTER — Other Ambulatory Visit: Payer: Self-pay

## 2013-02-06 ENCOUNTER — Other Ambulatory Visit (INDEPENDENT_AMBULATORY_CARE_PROVIDER_SITE_OTHER): Payer: 59

## 2013-02-06 DIAGNOSIS — I059 Rheumatic mitral valve disease, unspecified: Secondary | ICD-10-CM

## 2013-02-12 ENCOUNTER — Telehealth: Payer: Self-pay | Admitting: *Deleted

## 2013-02-12 NOTE — Telephone Encounter (Signed)
Spoke with patient and advised results appt canceled

## 2013-02-12 NOTE — Telephone Encounter (Signed)
The stress test is normal I am not convinced there is any problem with her lungs Okay to cancel Friday's visit----she already has her regular follow up scheduled at the end of April

## 2013-02-12 NOTE — Telephone Encounter (Signed)
Called pt to speak with her about her stress test results and pt wanted to know if her appt on Friday is necessary? Pt saw Fransico Setters, NP at [facility] and per pt her CT of the abdomen and pelvis done on 12/27/2012 showed "sticky lungs" per pt? Please advise  ( GI note scanned into pt's chart)

## 2013-02-14 ENCOUNTER — Ambulatory Visit: Payer: 59 | Admitting: Internal Medicine

## 2013-02-24 ENCOUNTER — Telehealth: Payer: Self-pay | Admitting: Internal Medicine

## 2013-02-24 NOTE — Telephone Encounter (Signed)
I would recommend antihistamines like cetirizine 10mg  daily Okay to send Rx for triamcinolone cream 0.1% #30gm x 0  Apply tid prn  Sounds like she may need oral prednisone and I won't prescribe that without seeing the rash Offer an appt tomorrow

## 2013-02-24 NOTE — Telephone Encounter (Signed)
Patient Information:  Caller Name: Emmajane  Phone: [phone]  Patient: Shannon [name], Shannon [name]  Gender: Female  DOB: May 10, 1956  Age: 57 Years  PCP: Tillman Abide [facility])  Pregnant: No  Office Follow Up:  Does the office need to follow up with this patient?: Yes  Instructions For The Office: Please follow up with patient regarding a prescription to treat poison oak.  OTCs not working.  RN Note:  Patient was exposed initially 3/29 with it on hands and lower arms.  Re-exposed this past Saturday 02/22/13 with it spreading up arms toward arm pits.  Rates discomfort from itching as an 8/10.  OTC products tried has not helped.  No emergent symptoms assessed.  However with home treatments not easing the discomfort patient is requesting for something to treat it be called in to her drug store of choice-if called in today she would like it to be [facility] [address].  If not called in until tomorrow it should be her regular pharmacy at [facility] on [address].  Symptoms  Reason For Call & Symptoms: Located the most arms/hands-and re-exposed this Saturday .  Has moved up arms since second exposure  Reviewed Health History In EMR: Yes  Reviewed Medications In EMR: Yes  Reviewed Allergies In EMR: Yes  Reviewed Surgeries / Procedures: Yes  Date of Onset of Symptoms: 02/15/2013  Treatments Tried: OTC  Treatments Tried Worked: No OB / GYN:  LMP: Unknown  Guideline(s) Used:  Poison [place] - [facility] or [facility]  Disposition Per Guideline:   Home Care  Reason For Disposition Reached:   Poison [place], [place], or [place] with no complications  Advice Given:  Apply Cold to the Area:  Soak the involved area in cool water for 20 minutes or massage it with an ice cube as often as necessary to reduce itching and oozing.  Expected Course:  Usually lasts 2 weeks. Treatment reduces the severity of the symptoms, not how long they last.  Apply Cold to the Area:  Soak the involved area in cool water for 20 minutes or  massage it with an ice cube as often as necessary to reduce itching and oozing.  Oral Antihistamine Medication for Itching:   Do not take antihistamine medications if you have prostate problems.  Antihistamines may cause sleepiness. Do not drink, drive, or operate dangerous machinery while taking antihistamines.  An over-the-counter antihistamine that causes less sleepiness is loratadine (e.g., Alavert or Claritin).  Expected Course:  Usually lasts 2 weeks. Treatment reduces the severity of the symptoms, not how long they last.  Call Back If:  Rash lasts longer than 3 weeks  It looks infected  You become worse.  RN Overrode Recommendation:  Patient Requests Prescription  Patient is requesting a prescription to be called in to treat the poison oak.

## 2013-02-25 MED ORDER — TRIAMCINOLONE ACETONIDE 0.1 % EX CREA: 1.0000 "application " | TOPICAL_CREAM | Freq: Three times a day (TID) | CUTANEOUS | Status: DC | PRN

## 2013-02-25 NOTE — Telephone Encounter (Signed)
Spoke with patient and advised results rx sent to pharmacy by e-script  

## 2013-03-17 ENCOUNTER — Encounter: Payer: Self-pay | Admitting: Internal Medicine

## 2013-03-17 ENCOUNTER — Ambulatory Visit (INDEPENDENT_AMBULATORY_CARE_PROVIDER_SITE_OTHER): Payer: 59 | Admitting: Internal Medicine

## 2013-03-17 VITALS — BP 112/80 | HR 80 | Temp 97.7°F | Wt 171.0 lb

## 2013-03-17 DIAGNOSIS — R51 Headache: Secondary | ICD-10-CM

## 2013-03-17 DIAGNOSIS — K219 Gastro-esophageal reflux disease without esophagitis: Secondary | ICD-10-CM

## 2013-03-17 DIAGNOSIS — I10 Essential (primary) hypertension: Secondary | ICD-10-CM

## 2013-03-17 DIAGNOSIS — E119 Type 2 diabetes mellitus without complications: Secondary | ICD-10-CM

## 2013-03-17 DIAGNOSIS — R519 Headache, unspecified: Secondary | ICD-10-CM | POA: Insufficient documentation

## 2013-03-17 LAB — HEMOGLOBIN A1C: Hgb A1c MFr Bld: 7 % — ABNORMAL HIGH (ref 4.6–6.5)

## 2013-03-17 MED ORDER — METOPROLOL SUCCINATE ER 25 MG PO TB24: 25.0000 mg | ORAL_TABLET | Freq: Every day | ORAL | Status: DC

## 2013-03-17 MED ORDER — METFORMIN HCL ER 500 MG PO TB24: 1000.0000 mg | ORAL_TABLET | Freq: Every day | ORAL | Status: DC

## 2013-03-17 NOTE — Patient Instructions (Signed)
If your headaches are not better with heat and acupuncture/chiropractic, let me know and I will set you up at the headache clinic.

## 2013-03-17 NOTE — Assessment & Plan Note (Signed)
Quiet on the med 

## 2013-03-17 NOTE — Assessment & Plan Note (Signed)
BP Readings from Last 3 Encounters:  03/17/13 112/80  02/04/13 122/80  01/22/13 122/80   Good control No changes

## 2013-03-17 NOTE — Assessment & Plan Note (Signed)
May be from her neck Will try heat Consider acupuncture/chiropractic To headache clinic if persists
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
## 2013-03-17 NOTE — Assessment & Plan Note (Signed)
A few high days Will increase the metformin dose if worsened

## 2013-03-24 ENCOUNTER — Other Ambulatory Visit: Payer: Self-pay | Admitting: Internal Medicine

## 2013-05-05 ENCOUNTER — Other Ambulatory Visit: Payer: Self-pay | Admitting: *Deleted

## 2013-05-05 ENCOUNTER — Other Ambulatory Visit: Payer: Self-pay | Admitting: Internal Medicine

## 2013-05-05 MED ORDER — MELOXICAM 7.5 MG PO TABS: 7.5000 mg | ORAL_TABLET | Freq: Every day | ORAL | Status: DC

## 2013-05-05 NOTE — Telephone Encounter (Signed)
Last filled 12/24/12

## 2013-06-26 ENCOUNTER — Other Ambulatory Visit: Payer: Self-pay | Admitting: Internal Medicine

## 2013-06-26 NOTE — Telephone Encounter (Signed)
LETVAK PATIENT, Please send back to me for call in  

## 2013-06-27 NOTE — Telephone Encounter (Signed)
Please call in

## 2013-06-27 NOTE — Telephone Encounter (Signed)
rx called into pharmacy

## 2013-08-01 ENCOUNTER — Other Ambulatory Visit: Payer: Self-pay | Admitting: Internal Medicine

## 2013-08-01 MED ORDER — GLUCOSE BLOOD VI STRP: ORAL_STRIP | Status: DC

## 2013-08-01 NOTE — Telephone Encounter (Signed)
rx sent to pharmacy by e-script  

## 2013-08-04 ENCOUNTER — Other Ambulatory Visit: Payer: Self-pay | Admitting: *Deleted

## 2013-08-04 MED ORDER — MELOXICAM 7.5 MG PO TABS: 7.5000 mg | ORAL_TABLET | Freq: Every day | ORAL | Status: DC

## 2013-08-04 NOTE — Telephone Encounter (Signed)
Okay to refill for a year 

## 2013-08-04 NOTE — Telephone Encounter (Signed)
Received faxed refill request from pharmacy. Last office visit 03/17/2013. Is it okay to refill medication?

## 2013-08-04 NOTE — Telephone Encounter (Signed)
rx sent to pharmacy by e-script  

## 2013-09-18 ENCOUNTER — Other Ambulatory Visit: Payer: Self-pay | Admitting: Internal Medicine

## 2013-09-23 ENCOUNTER — Ambulatory Visit (INDEPENDENT_AMBULATORY_CARE_PROVIDER_SITE_OTHER): Payer: 59 | Admitting: Internal Medicine

## 2013-09-23 ENCOUNTER — Encounter: Payer: Self-pay | Admitting: Internal Medicine

## 2013-09-23 VITALS — BP 122/70 | HR 85 | Temp 98.6°F | Ht 64.0 in | Wt 163.0 lb

## 2013-09-23 DIAGNOSIS — E119 Type 2 diabetes mellitus without complications: Secondary | ICD-10-CM

## 2013-09-23 DIAGNOSIS — E785 Hyperlipidemia, unspecified: Secondary | ICD-10-CM

## 2013-09-23 DIAGNOSIS — Z0000 Encounter for general adult medical examination without abnormal findings: Secondary | ICD-10-CM

## 2013-09-23 DIAGNOSIS — I10 Essential (primary) hypertension: Secondary | ICD-10-CM

## 2013-09-23 LAB — BASIC METABOLIC PANEL
BUN: 14 mg/dL (ref 6–23)
Chloride: 101 mEq/L (ref 96–112)
Creatinine, Ser: 1 mg/dL (ref 0.4–1.2)
GFR: 61.04 mL/min (ref >60.00)
Glucose, Bld: 98 mg/dL (ref 70–99)
Potassium: 4.1 mEq/L (ref 3.5–5.1)

## 2013-09-23 LAB — LIPID PANEL
Cholesterol: 209 mg/dL — ABNORMAL HIGH (ref 0–200)
HDL: 52.6 mg/dL (ref >39.00)
Total CHOL/HDL Ratio: 4
VLDL: 45.8 mg/dL — ABNORMAL HIGH (ref 0.0–40.0)

## 2013-09-23 LAB — MICROALBUMIN / CREATININE URINE RATIO
Creatinine,U: 163.9 mg/dL
Microalb, Ur: 4.5 mg/dL — ABNORMAL HIGH (ref 0.0–1.9)

## 2013-09-23 LAB — CBC WITH DIFFERENTIAL/PLATELET
Basophils Absolute: 0 10*3/uL (ref 0.0–0.1)
Basophils Relative: 0.3 % (ref 0.0–3.0)
Eosinophils Absolute: 0.2 10*3/uL (ref 0.0–0.7)
Eosinophils Relative: 3 % (ref 0.0–5.0)
HCT: 38.2 % (ref 36.0–46.0)
Hemoglobin: 13.1 g/dL (ref 12.0–15.0)
Lymphs Abs: 2.5 10*3/uL (ref 0.7–4.0)
MCHC: 34.3 g/dL (ref 30.0–36.0)
MCV: 86.6 fl (ref 78.0–100.0)
Monocytes Absolute: 0.6 10*3/uL (ref 0.1–1.0)
Monocytes Relative: 8.4 % (ref 3.0–12.0)
Neutro Abs: 4.3 10*3/uL (ref 1.4–7.7)
Neutrophils Relative %: 55.9 % (ref 43.0–77.0)
Platelets: 289 10*3/uL (ref 150.0–400.0)
RDW: 13 % (ref 11.5–14.6)
WBC: 7.7 10*3/uL (ref 4.5–10.5)

## 2013-09-23 LAB — HEPATIC FUNCTION PANEL
ALT: 41 U/L — ABNORMAL HIGH (ref 0–35)
Albumin: 4.3 g/dL (ref 3.5–5.2)
Alkaline Phosphatase: 80 U/L (ref 39–117)
Total Bilirubin: 0.5 mg/dL (ref 0.3–1.2)

## 2013-09-23 LAB — TSH: TSH: 1.13 u[IU]/mL (ref 0.35–5.50)

## 2013-09-23 LAB — LDL CHOLESTEROL, DIRECT: Direct LDL: 128.4 mg/dL

## 2013-09-23 MED ORDER — ONDANSETRON HCL 4 MG PO TABS: 4.0000 mg | ORAL_TABLET | Freq: Three times a day (TID) | ORAL | Status: DC | PRN

## 2013-09-23 MED ORDER — TRAMADOL HCL 50 MG PO TABS
50.0000 mg | ORAL_TABLET | Freq: Two times a day (BID) | ORAL | Status: DC | PRN
Start: 2013-09-23 — End: 2014-01-19

## 2013-09-23 MED ORDER — ATORVASTATIN CALCIUM 20 MG PO TABS: 20.0000 mg | ORAL_TABLET | Freq: Every day | ORAL | Status: DC

## 2013-09-23 NOTE — Assessment & Plan Note (Signed)
Doubt the cholesterol med causing GI symptoms Will check labs

## 2013-09-23 NOTE — Addendum Note (Signed)
Addended by: Sueanne Margarita on: 09/23/2013 02:06 PM   Modules accepted: Orders

## 2013-09-23 NOTE — Patient Instructions (Signed)
Please set up to see Dr Mechele Collin again due to worsening nausea

## 2013-09-23 NOTE — Assessment & Plan Note (Signed)
BP Readings from Last 3 Encounters:  09/23/13 122/70  03/17/13 112/80  02/04/13 122/80   Good control Will check urine microal and switch to ACEI if positive

## 2013-09-23 NOTE — Assessment & Plan Note (Signed)
Sugars seem lower with weight loss due to her nausea Will check labs

## 2013-09-23 NOTE — Assessment & Plan Note (Signed)
mammo due next year Will review colonoscopy with Dr Mechele Collin when she sees him soon about the nausea

## 2013-09-23 NOTE — Progress Notes (Signed)
Subjective:    Patient ID: Shannon [name], female    DOB: 05-15-1956, 57 y.o.   MRN: [id number]  HPI Here for physical  Mainly concerned about severe nausea Hits her every day---fine then suddenly severe nausea Not related to time of day, but is more common after eating Has cut back on eating due to this and has lost 8# Lots of bloating Full work up with GI earlier in the year---urged her to go back there  Daughter diagnosed with MTHFR mutation (?affecting homocysteine levels) Discussed that she is already on secondary heart prevention due to DM  Has new nodule on PIP on right thumb Hands hurt all the time and widespread pain  meloxicam daily and has the tramadol for occasional use  Checking sugars about once a week Highly variable Will occ get sweat and headache from low sugar--usually if she misses a meal  Current Outpatient Prescriptions on File Prior to Visit  Medication Sig Dispense Refill  . aspirin 81 MG tablet Take 81 mg by mouth daily.        . bimatoprost (LUMIGAN) 0.03 % ophthalmic drops 1 drop at bedtime.        Marland Kitchen glucose blood (BAYER CONTOUR NEXT TEST) test strip Check blood sugar once daily and as needed. 250.00  100 each  3  . meloxicam (MOBIC) 7.5 MG tablet Take 1 tablet (7.5 mg total) by mouth daily.  90 tablet  3  . metFORMIN (GLUCOPHAGE-XR) 500 MG 24 hr tablet Take 2 tablets (1,000 mg total) by mouth daily with breakfast.  180 tablet  3  . metoprolol succinate (TOPROL XL) 25 MG 24 hr tablet Take 1 tablet (25 mg total) by mouth daily.  90 tablet  3  . pantoprazole (PROTONIX) 40 MG tablet Take 1 tablet by mouth  daily  90 tablet  3  . traMADol (ULTRAM) 50 MG tablet TAKE ONE TABLET BY MOUTH TWICE DAILY AS NEEDED *MAX OF TWO TABLETS PER DAY*  60 tablet  0  . traZODone (DESYREL) 100 MG tablet Take 2 tablets by mouth  every night at bedtime  180 tablet  0  . triamterene-hydrochlorothiazide (MAXZIDE-25) 37.5-25 MG per tablet Take 1 each (1 tablet total) by mouth  daily.  90 tablet  3   No current facility-administered medications on file prior to visit.    Allergies  Allergen Reactions  . Clarithromycin   . Lisinopril     REACTION: cough  . Nitrofurantoin   . Oxycodone-Acetaminophen     REACTION: itching    Past Medical History  Diagnosis Date  . Asthma   . GERD (gastroesophageal reflux disease)   . Hypertension   . Osteopenia   . Mitral valve prolapse   . Glaucoma suspect   . Sleep disturbance   . Allergy   . Depression   . Shingles 11/2006  . Diabetes mellitus 11/12  . Hyperlipidemia     Past Surgical History  Procedure Laterality Date  . Abdominal hysterectomy  2000    /BSO - abn. Paps  . Appendectomy  1978  . Breast enhancement surgery  1987  . Bladder repair      x2  . Rectal prolapse repair      x1  . Nm myoview ltd  03/2005    EF 56%, no ischemia  . Shoulder surgery  09/2008, 01/2009    Right shoulder surgery, then redone with lysis of adhesions  . Cervical discectomy  11/2009    with synthetic discs inserted--  Dr Lovell Sheehan  . Cardiac catheterization      [facility]    Family History  Problem Relation Age of Onset  . Diabetes Mother   . Heart disease Mother   . Hypertension Mother   . Ovarian cancer Mother   . Melanoma Mother   . Cancer Father   . Coronary artery disease Father   . Heart disease Father   . Diabetes Sister   . Depression Sister   . Coronary artery disease Sister   . Heart disease Sister   . Rheum arthritis Maternal Aunt   . Breast cancer Maternal Grandmother   . Diabetes Maternal Grandmother   . Diabetes Maternal Grandfather   . Colon cancer Neg Hx   . Heart attack Paternal Uncle   . Heart attack Cousin   . Heart attack Paternal Uncle     History   Social History  . Marital Status: Married    Spouse Name: N/A    Number of Children: 5  . Years of Education: N/A   Occupational History  . homemaker    Social History Main Topics  . Smoking status: Never Smoker   . Smokeless  tobacco: Never Used  . Alcohol Use: No  . Drug Use: No  . Sexual Activity: Not on file   Other Topics Concern  . Not on file   Social History Narrative   Home maker   Doesn't exercise      Skipper Cliche sister         Review of Systems  Constitutional: Positive for unexpected weight change. Negative for fatigue.       Wears seat belt  HENT: Positive for congestion and rhinorrhea. Negative for dental problem, hearing loss and tinnitus.        Regular with dentist  Eyes: Negative for visual disturbance.       Followed for glaucoma  Respiratory: Negative for cough, chest tightness and shortness of breath.   Cardiovascular: Positive for palpitations. Negative for chest pain and leg swelling.       Had palpitations on stressful day  Gastrointestinal: Positive for nausea and abdominal distention. Negative for vomiting and blood in stool.       Stools are usually loose Occ sees gold color stool  Endocrine: Negative for cold intolerance and heat intolerance.  Genitourinary: Positive for dyspareunia. Negative for dysuria, hematuria and difficulty urinating.       Has bladder prolapse again despite past surgery (and rectocele)  Musculoskeletal: Positive for arthralgias and back pain. Negative for joint swelling.  Skin: Negative for rash.       No suspicious lesions  Allergic/Immunologic: Positive for environmental allergies. Negative for immunocompromised state.  Neurological: Positive for dizziness and headaches. Negative for syncope, weakness, light-headedness and numbness.       Some dizziness--?vertigo variant Headaches are better  Hematological: Negative for adenopathy. Does not bruise/bleed easily.  Psychiatric/Behavioral: Negative for sleep disturbance and dysphoric mood. The patient is nervous/anxious.        Mild anxiety       Objective:   Physical Exam  Constitutional: She is oriented to person, place, and time. She appears well-developed and well-nourished. No  distress.  HENT:  Head: Normocephalic and atraumatic.  Right Ear: External ear normal.  Left Ear: External ear normal.  Mouth/Throat: Oropharynx is clear and moist. No oropharyngeal exudate.  Eyes: Conjunctivae and EOM are normal. Pupils are equal, round, and reactive to light.  Neck: Normal range of motion. Neck supple. No thyromegaly present.  Cardiovascular: Normal rate, regular rhythm, normal heart sounds and intact distal pulses.  Exam reveals no gallop.   No murmur heard. Pulmonary/Chest: Effort normal and breath sounds normal. No respiratory distress. She has no wheezes. She has no rales.  Abdominal: Soft. There is no tenderness.  Genitourinary:  Implants intact in both breasts No masses  Musculoskeletal: She exhibits no edema and no tenderness.  Lymphadenopathy:    She has no cervical adenopathy.    She has no axillary adenopathy.  Neurological: She is alert and oriented to person, place, and time.  Normal sensation on plantar feet  Skin: No rash noted. No erythema.  No foot lesions  Psychiatric: She has a normal mood and affect. Her behavior is normal.          Assessment & Plan:

## 2013-11-11 ENCOUNTER — Other Ambulatory Visit: Payer: Self-pay | Admitting: Internal Medicine

## 2013-11-18 ENCOUNTER — Ambulatory Visit: Payer: Self-pay | Admitting: Unknown Physician Specialty

## 2013-12-27 ENCOUNTER — Other Ambulatory Visit: Payer: Self-pay | Admitting: Internal Medicine

## 2013-12-31 ENCOUNTER — Ambulatory Visit: Payer: Self-pay | Admitting: Oncology

## 2014-01-18 ENCOUNTER — Ambulatory Visit: Payer: Self-pay | Admitting: Oncology

## 2014-01-19 ENCOUNTER — Other Ambulatory Visit: Payer: Self-pay | Admitting: Internal Medicine

## 2014-01-19 NOTE — Telephone Encounter (Signed)
09/23/13 

## 2014-01-19 NOTE — Telephone Encounter (Signed)
rx called into pharmacy

## 2014-01-19 NOTE — Telephone Encounter (Signed)
Okay #60 x 0 

## 2014-02-04 ENCOUNTER — Other Ambulatory Visit: Payer: Self-pay | Admitting: Internal Medicine

## 2014-02-11 ENCOUNTER — Ambulatory Visit: Payer: Self-pay | Admitting: Unknown Physician Specialty

## 2014-02-12 ENCOUNTER — Telehealth: Payer: Self-pay

## 2014-02-12 NOTE — Telephone Encounter (Signed)
Pt has found knot in rt breast 2-3 months ago but now knot is tender and more oblong in shape;pt scheduled appt to see Dr Silvio Pate 02/13/14 at 9 AM.

## 2014-02-12 NOTE — Telephone Encounter (Signed)
Will evaluate at visit tomorrow

## 2014-02-13 ENCOUNTER — Ambulatory Visit (INDEPENDENT_AMBULATORY_CARE_PROVIDER_SITE_OTHER): Payer: 59 | Admitting: Internal Medicine

## 2014-02-13 ENCOUNTER — Encounter: Payer: Self-pay | Admitting: Internal Medicine

## 2014-02-13 VITALS — BP 130/80 | HR 86 | Temp 98.3°F | Wt 164.0 lb

## 2014-02-13 DIAGNOSIS — N6019 Diffuse cystic mastopathy of unspecified breast: Secondary | ICD-10-CM

## 2014-02-13 MED ORDER — ONDANSETRON HCL 4 MG PO TABS: 4.0000 mg | ORAL_TABLET | Freq: Three times a day (TID) | ORAL | Status: DC | PRN

## 2014-02-13 NOTE — Addendum Note (Signed)
Addended by: Viviana Simpler I on: 02/13/2014 10:27 AM   Modules accepted: Orders

## 2014-02-13 NOTE — Assessment & Plan Note (Signed)
Just in case, we will check diagnostic mammo and ultrasound if needed

## 2014-02-13 NOTE — Addendum Note (Signed)
Addended by: Despina Hidden on: 02/13/2014 11:22 AM   Modules accepted: Orders

## 2014-02-13 NOTE — Progress Notes (Signed)
Subjective:    Patient ID: Shannon [name], female    DOB: 06-15-1956, 58 y.o.   MRN: [id number]  HPI Has had a small lump inside the lower part of her right breast Goes back for months Now seems larger and is slightly tender  No breast discharge Some pain even without palpation in past week---like throbbing  Current Outpatient Prescriptions on File Prior to Visit  Medication Sig Dispense Refill  . aspirin 81 MG tablet Take 81 mg by mouth daily.        Marland Kitchen atorvastatin (LIPITOR) 20 MG tablet Take 1 tablet (20 mg total) by mouth daily.  90 tablet  3  . bimatoprost (LUMIGAN) 0.03 % ophthalmic drops 1 drop at bedtime.        Marland Kitchen glucose blood (BAYER CONTOUR NEXT TEST) test strip Check blood sugar once daily and as needed. 250.00  100 each  3  . metFORMIN (GLUCOPHAGE-XR) 500 MG 24 hr tablet Take 2 tablets (1,000 mg total) by mouth daily with breakfast.  180 tablet  3  . metoprolol succinate (TOPROL XL) 25 MG 24 hr tablet Take 1 tablet (25 mg total) by mouth daily.  90 tablet  3  . pantoprazole (PROTONIX) 40 MG tablet Take 1 tablet by mouth  daily  90 tablet  3  . traMADol (ULTRAM) 50 MG tablet TAKE ONE TABLET BY MOUTH TWICE DAILY AS NEEDED  60 tablet  0  . traZODone (DESYREL) 100 MG tablet Take 2 tablets by mouth  every night at bedtime  180 tablet  3  . triamterene-hydrochlorothiazide (MAXZIDE-25) 37.5-25 MG per tablet TAKE ONE TABLET BY MOUTH ONCE DAILY  90 tablet  0   No current facility-administered medications on file prior to visit.    Allergies  Allergen Reactions  . Clarithromycin   . Lisinopril     REACTION: cough  . Nitrofurantoin   . Oxycodone-Acetaminophen     REACTION: itching    Past Medical History  Diagnosis Date  . Asthma   . GERD (gastroesophageal reflux disease)   . Hypertension   . Osteopenia   . Mitral valve prolapse   . Glaucoma suspect   . Sleep disturbance   . Allergy   . Depression   . Shingles 11/2006  . Diabetes mellitus 11/12  . Hyperlipidemia      Past Surgical History  Procedure Laterality Date  . Abdominal hysterectomy  2000    /BSO - abn. Paps  . Appendectomy  1978  . Breast enhancement surgery  1987  . Bladder repair      x2  . Rectal prolapse repair      x1  . Nm myoview ltd  03/2005    EF 56%, no ischemia  . Shoulder surgery  09/2008, 01/2009    Right shoulder surgery, then redone with lysis of adhesions  . Cervical discectomy  11/2009    with synthetic discs inserted--  Dr Arnoldo Morale  . Cardiac catheterization      [facility]    Family History  Problem Relation Age of Onset  . Diabetes Mother   . Heart disease Mother   . Hypertension Mother   . Ovarian cancer Mother   . Melanoma Mother   . Cancer Father   . Coronary artery disease Father   . Heart disease Father   . Diabetes Sister   . Depression Sister   . Coronary artery disease Sister   . Heart disease Sister   . Rheum arthritis Maternal Aunt   .  Breast cancer Maternal Grandmother   . Diabetes Maternal Grandmother   . Diabetes Maternal Grandfather   . Colon cancer Neg Hx   . Heart attack Paternal Uncle   . Heart attack Cousin   . Heart attack Paternal Uncle     History   Social History  . Marital Status: Married    Spouse Name: N/A    Number of Children: 91  . Years of Education: N/A   Occupational History  . homemaker    Social History Main Topics  . Smoking status: Never Smoker   . Smokeless tobacco: Never Used  . Alcohol Use: No  . Drug Use: No  . Sexual Activity: Not on file   Other Topics Concern  . Not on file   Social History Narrative   Home maker   Doesn't exercise      Shannon [name] sister         Review of Systems No fever  Not sick     Objective:   Physical Exam  Genitourinary:  Moderate cystic changes in both periareolar regions--more so on the right Only very slight tenderness on right ~7 o'clock  Lymphadenopathy:    She has no axillary adenopathy.          Assessment & Plan:

## 2014-02-16 ENCOUNTER — Ambulatory Visit: Payer: Self-pay | Admitting: Internal Medicine

## 2014-02-17 ENCOUNTER — Encounter: Payer: Self-pay | Admitting: Internal Medicine

## 2014-02-18 ENCOUNTER — Ambulatory Visit: Payer: Self-pay | Admitting: Oncology

## 2014-02-18 ENCOUNTER — Ambulatory Visit: Payer: Self-pay | Admitting: Unknown Physician Specialty

## 2014-02-26 ENCOUNTER — Other Ambulatory Visit: Payer: Self-pay | Admitting: Internal Medicine

## 2014-03-02 ENCOUNTER — Other Ambulatory Visit: Payer: Self-pay | Admitting: Family Medicine

## 2014-03-02 MED ORDER — ATORVASTATIN CALCIUM 20 MG PO TABS: 20.0000 mg | ORAL_TABLET | Freq: Every day | ORAL | Status: DC

## 2014-03-02 MED ORDER — TRIAMTERENE-HCTZ 37.5-25 MG PO TABS: ORAL_TABLET | ORAL | Status: DC

## 2014-03-07 ENCOUNTER — Emergency Department: Payer: Self-pay | Admitting: Emergency Medicine

## 2014-03-07 LAB — COMPREHENSIVE METABOLIC PANEL
ALK PHOS: 106 U/L
AST: 29 U/L (ref 15–37)
Albumin: 3.9 g/dL (ref 3.4–5.0)
Anion Gap: 9 (ref 7–16)
BUN: 12 mg/dL (ref 7–18)
Bilirubin,Total: 0.5 mg/dL (ref 0.2–1.0)
Calcium, Total: 10.6 mg/dL — ABNORMAL HIGH (ref 8.5–10.1)
Chloride: 103 mmol/L (ref 98–107)
Co2: 26 mmol/L (ref 21–32)
Creatinine: 1.01 mg/dL (ref 0.60–1.30)
EGFR (African American): >60
EGFR (Non-African Amer.): >60
GLUCOSE: 150 mg/dL — AB (ref 65–99)
OSMOLALITY: 278 (ref 275–301)
Potassium: 3.5 mmol/L (ref 3.5–5.1)
SGPT (ALT): 58 U/L (ref 12–78)
SODIUM: 138 mmol/L (ref 136–145)
Total Protein: 7.7 g/dL (ref 6.4–8.2)

## 2014-03-07 LAB — CBC
HCT: 40.6 % (ref 35.0–47.0)
HGB: 13.5 g/dL (ref 12.0–16.0)
MCH: 28.4 pg (ref 26.0–34.0)
MCHC: 33.1 g/dL (ref 32.0–36.0)
MCV: 86 fL (ref 80–100)
Platelet: 298 10*3/uL (ref 150–440)
RBC: 4.75 10*6/uL (ref 3.80–5.20)
RDW: 13 % (ref 11.5–14.5)
WBC: 9.4 10*3/uL (ref 3.6–11.0)

## 2014-03-07 LAB — URINALYSIS, COMPLETE
Bacteria: NONE SEEN
Bilirubin,UR: NEGATIVE
GLUCOSE, UR: NEGATIVE mg/dL (ref 0–75)
KETONE: NEGATIVE
Leukocyte Esterase: NEGATIVE
Nitrite: NEGATIVE
PROTEIN: NEGATIVE
Ph: 7 (ref 4.5–8.0)
RBC,UR: <1 /HPF (ref 0–5)
Specific Gravity: 1.004 (ref 1.003–1.030)

## 2014-03-07 LAB — LIPASE, BLOOD: LIPASE: 146 U/L (ref 73–393)

## 2014-03-10 ENCOUNTER — Telehealth: Payer: Self-pay

## 2014-03-10 NOTE — Telephone Encounter (Signed)
Patient Information:  Caller Name: [name]  Phone: [phone]  Patient: Shannon [name], Shannon [name]  Gender: Female  DOB: 30-Apr-1956  Age: 58 Years  PCP: Viviana Simpler [facility])  Office Follow Up:  Does the office need to follow up with this patient?: Yes  Instructions For The Office: No appointments available at office, RN/CAN sent to Urgent Care but office may want to follow up if these antibiotics will be "new allergies".  RN Note:  Afebrile. Onset last Thursday 03/05/2014 abdominal pain (left sided) by Saturday 03/07/2014 progressed to severe and went to the ER and dx. inflammation of the colon. Since starting the medications, Flagyl and Cipro, blood sugars are up and down, she feels weak and shaking all over, nauseated. The abdominal pain is almost gone but the side effects are making her feel terrible. She is in the MODERATE weak category. BS is usually 120 after the 2 am hypoglycemic pills, now it is 300 and 2 hours later down to 150. She is not sure if these symptoms are from the medication, but assuming they are since they start after taking it. RN/CAN called office and no appointment available (she only agreed to this office) and Ebony Hail transferred to triage nurse [place]. Rena states noone can see her and ok to send to Urgent Care or ER. She refused ER and will have someone drive her to Urgent Care. She asked about keeping appointment Thursday and RN/CAN advised keeping it for now and cancelling if Urgent Care solves the issue. She agreed.  Symptoms  Reason For Call & Symptoms: Flagyl and Cipro for inflammation in COLON (did CT scan) and medication causing Knees shaking, nauseated and weak all over. Blood sugars are all over, up and down.  Reviewed Health History In EMR: Yes  Reviewed Medications In EMR: Yes  Reviewed Allergies In EMR: Yes  Reviewed Surgeries / Procedures: Yes  Date of Onset of Symptoms: 03/07/2014  Guideline(s) Used:  Weakness (Generalized) and Fatigue  Disposition  Per Guideline:   Go to Office Now  Reason For Disposition Reached:   Moderate weakness (i.e., interferes with work, school, normal activities) and cause unknown  Advice Given:  N/A  Patient Will Follow Care Advice:  YES

## 2014-03-10 NOTE — Telephone Encounter (Signed)
Jonelle Sidle with CAN said pt was seen last weekend at ED for lt abd pain and started on flagyl and cipro. Now pt is experiencing body shaking, very weak and nauseated; no available appts at [facility]; Jonelle Sidle had offered pt an appt at another [facility] site and pt refused. Disposition is pt needs to be seen today. Advised to have pt go to UC now for eval. Jonelle Sidle voiced understanding.

## 2014-03-12 ENCOUNTER — Ambulatory Visit: Payer: 59 | Admitting: Internal Medicine

## 2014-03-17 ENCOUNTER — Ambulatory Visit (INDEPENDENT_AMBULATORY_CARE_PROVIDER_SITE_OTHER): Payer: 59 | Admitting: Internal Medicine

## 2014-03-17 ENCOUNTER — Encounter: Payer: Self-pay | Admitting: Internal Medicine

## 2014-03-17 VITALS — BP 128/82 | HR 90 | Temp 98.1°F | Wt 161.8 lb

## 2014-03-17 DIAGNOSIS — E119 Type 2 diabetes mellitus without complications: Secondary | ICD-10-CM

## 2014-03-17 DIAGNOSIS — K529 Noninfective gastroenteritis and colitis, unspecified: Secondary | ICD-10-CM | POA: Insufficient documentation

## 2014-03-17 DIAGNOSIS — K5289 Other specified noninfective gastroenteritis and colitis: Secondary | ICD-10-CM

## 2014-03-17 MED ORDER — GLIPIZIDE ER 5 MG PO TB24: 5.0000 mg | ORAL_TABLET | Freq: Every day | ORAL | Status: DC

## 2014-03-17 NOTE — Progress Notes (Signed)
Pre visit review using our clinic review tool, if applicable. No additional management support is needed unless otherwise documented below in the visit note. 

## 2014-03-17 NOTE — Progress Notes (Signed)
Subjective:    Patient ID: Shannon [name], female    DOB: [birth year], 58 y.o.   MRN: [id number]  HPI 10 days ago--to ER Had 2 days of intermittent left flank and back pain Then worsened and it kept her up so went to ER Got CT and blood work---diagnosed with "colitis" ---not told diverticulitis Got flagyl and cipro---knees got weak and nausea was worse Stopped the meds and got a little worse Restarted the flagyl and felt bad again  Has had nausea on and off for a year Had been seeing Ms Jerelene Redden at [facility]---hasn't seen Dr Vira Agar for some time Very frustrated  Still with intermittent pain No blood in stool--but did turn very dark  Current Outpatient Prescriptions on File Prior to Visit  Medication Sig Dispense Refill  . aspirin 81 MG tablet Take 81 mg by mouth daily.        Marland Kitchen atorvastatin (LIPITOR) 20 MG tablet Take 1 tablet (20 mg total) by mouth daily.  90 tablet  2  . bimatoprost (LUMIGAN) 0.03 % ophthalmic drops 1 drop at bedtime.        Marland Kitchen glucose blood (BAYER CONTOUR NEXT TEST) test strip Check blood sugar once daily and as needed. 250.00  100 each  3  . metFORMIN (GLUCOPHAGE-XR) 500 MG 24 hr tablet Take 2 tablets by mouth  daily with breakfast  180 tablet  3  . metoprolol succinate (TOPROL-XL) 25 MG 24 hr tablet Take 1 tablet by mouth  daily  90 tablet  3  . ondansetron (ZOFRAN) 4 MG tablet Take 1 tablet (4 mg total) by mouth 3 (three) times daily as needed for nausea or vomiting.  40 tablet  0  . pantoprazole (PROTONIX) 40 MG tablet Take 1 tablet by mouth  daily  90 tablet  3  . traMADol (ULTRAM) 50 MG tablet TAKE ONE TABLET BY MOUTH TWICE DAILY AS NEEDED  60 tablet  0  . traZODone (DESYREL) 100 MG tablet Take 2 tablets by mouth  every night at bedtime  180 tablet  3  . triamterene-hydrochlorothiazide (MAXZIDE-25) 37.5-25 MG per tablet TAKE ONE TABLET BY MOUTH ONCE DAILY  90 tablet  2   No current facility-administered medications on file prior to visit.    Allergies  Allergen  Reactions  . Clarithromycin   . Lisinopril     REACTION: cough  . Nitrofurantoin   . Oxycodone-Acetaminophen     REACTION: itching    Past Medical History  Diagnosis Date  . Asthma   . GERD (gastroesophageal reflux disease)   . Hypertension   . Osteopenia   . Mitral valve prolapse   . Glaucoma suspect   . Sleep disturbance   . Allergy   . Depression   . Shingles 11/2006  . Diabetes mellitus 11/12  . Hyperlipidemia     Past Surgical History  Procedure Laterality Date  . Abdominal hysterectomy  2000    /BSO - abn. Paps  . Appendectomy  1978  . Breast enhancement surgery  1987  . Bladder repair      x2  . Rectal prolapse repair      x1  . Nm myoview ltd  03/2005    EF 56%, no ischemia  . Shoulder surgery  09/2008, 01/2009    Right shoulder surgery, then redone with lysis of adhesions  . Cervical discectomy  11/2009    with synthetic discs inserted--  Dr Arnoldo Morale  . Cardiac catheterization      [facility]  Family History  Problem Relation Age of Onset  . Diabetes Mother   . Heart disease Mother   . Hypertension Mother   . Ovarian cancer Mother   . Melanoma Mother   . Cancer Father   . Coronary artery disease Father   . Heart disease Father   . Diabetes Sister   . Depression Sister   . Coronary artery disease Sister   . Heart disease Sister   . Rheum arthritis Maternal Aunt   . Breast cancer Maternal Grandmother   . Diabetes Maternal Grandmother   . Diabetes Maternal Grandfather   . Colon cancer Neg Hx   . Heart attack Paternal Uncle   . Heart attack Cousin   . Heart attack Paternal Uncle     History   Social History  . Marital Status: Married    Spouse Name: N/A    Number of Children: 9  . Years of Education: N/A   Occupational History  . homemaker    Social History Main Topics  . Smoking status: Never Smoker   . Smokeless tobacco: Never Used  . Alcohol Use: No  . Drug Use: No  . Sexual Activity: Not on file   Other Topics Concern  . Not on  file   Social History Narrative   Home maker   Doesn't exercise      [name] sister         Review of Systems Appetite is okay 3# weight loss in past month Some fever when at ER--none since No SOB but does have slight tickle cough Sugar up and down since symptoms have started    Objective:   Physical Exam  Constitutional: She appears well-developed. No distress.  Neck: Normal range of motion. Neck supple. No thyromegaly present.  Cardiovascular: Normal rate, regular rhythm and normal heart sounds.  Exam reveals no gallop.   No murmur heard. Pulmonary/Chest: Effort normal and breath sounds normal. No respiratory distress. She has no wheezes. She has no rales.  Abdominal: Soft. Bowel sounds are normal. She exhibits no distension. There is no rebound and no guarding.  Tenderness in epigastrium and along the left side  Musculoskeletal: She exhibits no edema and no tenderness.  Lymphadenopathy:    She has no cervical adenopathy.  Psychiatric:  Frustrated but not depressed          Assessment & Plan:

## 2014-03-17 NOTE — Patient Instructions (Signed)
For the next 2 weeks, don't take aspirin, atorvastatin and metformin. Try a daily probiotic (like align, activia yogurt, or other over the counter)

## 2014-03-17 NOTE — Assessment & Plan Note (Signed)
Will try glipizide while off the metformin

## 2014-03-17 NOTE — Assessment & Plan Note (Signed)
CT diagnosis from ER Not clear if antibiotics helped Also persistent nausea that has defied diagnosis  Will check ER records---requested Trial off asa, metformin and atorvastatin Try probiotic If no better in 2 weeks, try 2nd opinion with GI

## 2014-03-20 ENCOUNTER — Telehealth: Payer: Self-pay

## 2014-03-20 NOTE — Telephone Encounter (Signed)
Relevant patient education assigned to patient using Emmi. ° °

## 2014-03-23 ENCOUNTER — Ambulatory Visit: Payer: 59 | Admitting: Internal Medicine

## 2014-03-31 ENCOUNTER — Ambulatory Visit (INDEPENDENT_AMBULATORY_CARE_PROVIDER_SITE_OTHER): Payer: 59 | Admitting: Internal Medicine

## 2014-03-31 ENCOUNTER — Encounter: Payer: Self-pay | Admitting: Internal Medicine

## 2014-03-31 ENCOUNTER — Encounter: Payer: Self-pay | Admitting: *Deleted

## 2014-03-31 ENCOUNTER — Ambulatory Visit (INDEPENDENT_AMBULATORY_CARE_PROVIDER_SITE_OTHER)
Admission: RE | Admit: 2014-03-31 | Discharge: 2014-03-31 | Disposition: A | Discharge status: Home / Self Care | Payer: 59 | Source: Ambulatory Visit | Attending: Internal Medicine | Admitting: Internal Medicine

## 2014-03-31 VITALS — BP 110/70 | HR 93 | Temp 97.7°F | Wt 162.0 lb

## 2014-03-31 DIAGNOSIS — I10 Essential (primary) hypertension: Secondary | ICD-10-CM

## 2014-03-31 DIAGNOSIS — M25561 Pain in right knee: Secondary | ICD-10-CM

## 2014-03-31 DIAGNOSIS — E785 Hyperlipidemia, unspecified: Secondary | ICD-10-CM

## 2014-03-31 DIAGNOSIS — K529 Noninfective gastroenteritis and colitis, unspecified: Secondary | ICD-10-CM

## 2014-03-31 DIAGNOSIS — K5289 Other specified noninfective gastroenteritis and colitis: Secondary | ICD-10-CM

## 2014-03-31 DIAGNOSIS — E119 Type 2 diabetes mellitus without complications: Secondary | ICD-10-CM

## 2014-03-31 DIAGNOSIS — M25569 Pain in unspecified knee: Secondary | ICD-10-CM

## 2014-03-31 LAB — HEMOGLOBIN A1C: Hgb A1c MFr Bld: 8 % — ABNORMAL HIGH (ref 4.6–6.5)

## 2014-03-31 MED ORDER — GLIPIZIDE ER 10 MG PO TB24: 10.0000 mg | ORAL_TABLET | Freq: Every day | ORAL | Status: DC

## 2014-03-31 NOTE — Assessment & Plan Note (Signed)
Sugars have been up for some time Will increase the glipizide Need to consider lantus

## 2014-03-31 NOTE — Assessment & Plan Note (Signed)
BP Readings from Last 3 Encounters:  03/31/14 110/70  03/17/14 128/82  02/13/14 130/80   Good control

## 2014-03-31 NOTE — Progress Notes (Signed)
Pre visit review using our clinic review tool, if applicable. No additional management support is needed unless otherwise documented below in the visit note. 

## 2014-03-31 NOTE — Assessment & Plan Note (Signed)
Will hold off on the atorvastatin still

## 2014-03-31 NOTE — Progress Notes (Signed)
Subjective:    Patient ID: Shannon [name], female    DOB: 1956/01/16, 58 y.o.   MRN: [id number]  HPI Bowels are better Still some pain but much improved Appetite is "so-so" Bowels are regular  Sugars are staying high Over 200 a lot of the time No side effects with the glipizide though  No chest pain No SOB No edema  Right knee is bothering her Has tried an elastic brace Grinds when going up steps for years---now worse Tough doing garden and yard work No swelling Has taken occasional tylenol with no success Slight help with heating pad at times  Current Outpatient Prescriptions on File Prior to Visit  Medication Sig Dispense Refill  . aspirin 81 MG tablet Take 81 mg by mouth daily.        Marland Kitchen atorvastatin (LIPITOR) 20 MG tablet Take 1 tablet (20 mg total) by mouth daily.  90 tablet  2  . bimatoprost (LUMIGAN) 0.03 % ophthalmic drops 1 drop at bedtime.        Marland Kitchen glipiZIDE (GLUCOTROL XL) 5 MG 24 hr tablet Take 1 tablet (5 mg total) by mouth daily with breakfast.  30 tablet  3  . glucose blood (BAYER CONTOUR NEXT TEST) test strip Check blood sugar once daily and as needed. 250.00  100 each  3  . metFORMIN (GLUCOPHAGE-XR) 500 MG 24 hr tablet Take 2 tablets by mouth  daily with breakfast  180 tablet  3  . metoprolol succinate (TOPROL-XL) 25 MG 24 hr tablet Take 1 tablet by mouth  daily  90 tablet  3  . ondansetron (ZOFRAN) 4 MG tablet Take 1 tablet (4 mg total) by mouth 3 (three) times daily as needed for nausea or vomiting.  40 tablet  0  . pantoprazole (PROTONIX) 40 MG tablet Take 1 tablet by mouth  daily  90 tablet  3  . traMADol (ULTRAM) 50 MG tablet TAKE ONE TABLET BY MOUTH TWICE DAILY AS NEEDED  60 tablet  0  . traZODone (DESYREL) 100 MG tablet Take 2 tablets by mouth  every night at bedtime  180 tablet  3  . triamterene-hydrochlorothiazide (MAXZIDE-25) 37.5-25 MG per tablet TAKE ONE TABLET BY MOUTH ONCE DAILY  90 tablet  2   No current facility-administered medications on  file prior to visit.    Allergies  Allergen Reactions  . Clarithromycin   . Lisinopril     REACTION: cough  . Nitrofurantoin   . Oxycodone-Acetaminophen     REACTION: itching    Past Medical History  Diagnosis Date  . Asthma   . GERD (gastroesophageal reflux disease)   . Hypertension   . Osteopenia   . Mitral valve prolapse   . Glaucoma suspect   . Sleep disturbance   . Allergy   . Depression   . Shingles 11/2006  . Diabetes mellitus 11/12  . Hyperlipidemia     Past Surgical History  Procedure Laterality Date  . Abdominal hysterectomy  2000    /BSO - abn. Paps  . Appendectomy  1978  . Breast enhancement surgery  1987  . Bladder repair      x2  . Rectal prolapse repair      x1  . Nm myoview ltd  03/2005    EF 56%, no ischemia  . Shoulder surgery  09/2008, 01/2009    Right shoulder surgery, then redone with lysis of adhesions  . Cervical discectomy  11/2009    with synthetic discs inserted--  Dr Arnoldo Morale  .  Cardiac catheterization      [facility]    Family History  Problem Relation Age of Onset  . Diabetes Mother   . Heart disease Mother   . Hypertension Mother   . Ovarian cancer Mother   . Melanoma Mother   . Cancer Father   . Coronary artery disease Father   . Heart disease Father   . Diabetes Sister   . Depression Sister   . Coronary artery disease Sister   . Heart disease Sister   . Rheum arthritis Maternal Aunt   . Breast cancer Maternal Grandmother   . Diabetes Maternal Grandmother   . Diabetes Maternal Grandfather   . Colon cancer Neg Hx   . Heart attack Paternal Uncle   . Heart attack Cousin   . Heart attack Paternal Uncle     History   Social History  . Marital Status: Married    Spouse Name: N/A    Number of Children: 25  . Years of Education: N/A   Occupational History  . homemaker    Social History Main Topics  . Smoking status: Never Smoker   . Smokeless tobacco: Never Used  . Alcohol Use: No  . Drug Use: No  . Sexual Activity:  Not on file   Other Topics Concern  . Not on file   Social History Narrative   Home maker   Doesn't exercise      Shannon [name] sister         Review of Systems Weight is stable Sleeps fine    Objective:   Physical Exam  Constitutional: She appears well-developed and well-nourished. No distress.  Neck: Normal range of motion. Neck supple. No thyromegaly present.  Cardiovascular: Normal rate, regular rhythm, normal heart sounds and intact distal pulses.  Exam reveals no gallop.   No murmur heard. Pulmonary/Chest: Effort normal and breath sounds normal. No respiratory distress. She has no wheezes. She has no rales.  Abdominal: Soft. She exhibits no distension. There is no rebound and no guarding.  Still has LLQ tenderness  Musculoskeletal: She exhibits no edema and no tenderness.  Mild crepitus in right knee Slight lateral meniscus pain ([place]) but not repeated  Lymphadenopathy:    She has no cervical adenopathy.  Skin: No rash noted.  Psychiatric: She has a normal mood and affect. Her behavior is normal.          Assessment & Plan:

## 2014-03-31 NOTE — Assessment & Plan Note (Signed)
Improved off the aspirin, metformin and atorvastatin Still some tenderness Will stay off the meds and recheck in 1 month To GI if any persistent pain

## 2014-03-31 NOTE — Assessment & Plan Note (Signed)
Seems arthritic Will check x-ray

## 2014-04-01 ENCOUNTER — Encounter: Payer: Self-pay | Admitting: Internal Medicine

## 2014-04-01 ENCOUNTER — Encounter: Payer: Self-pay | Admitting: *Deleted

## 2014-04-09 ENCOUNTER — Telehealth: Payer: Self-pay | Admitting: Internal Medicine

## 2014-04-09 DIAGNOSIS — M25561 Pain in right knee: Secondary | ICD-10-CM

## 2014-04-09 NOTE — Telephone Encounter (Signed)
Pt called and says she would like a referral to an orthopedic for her right knee. Pt states she doesn't want to go [facility] unless she can get in w/Dr. Marry Guan. Pt prefers to stay in [place] if possible.

## 2014-04-16 ENCOUNTER — Telehealth: Payer: Self-pay | Admitting: *Deleted

## 2014-04-16 NOTE — Telephone Encounter (Signed)
Pt states she recently started Glipizide 10 mg, and has been having abnormal blood sugar readings. A few days ago BS was 325 four hours pc. Yesterday it was 178 two hours pc, the 2 hours later she began experiencing shaking, and sweating. The checked BS again and it was 65. She says her readings have been very sporadic since starting the Glipizide.

## 2014-04-16 NOTE — Telephone Encounter (Signed)
It is not unusual to have some inconsistencies--- and certainly low sugars---with the glipizide  Find out if her colitis is better. It would be nice to see if she can tolerate a small dose of the metformin again. She can either split the glipizide to 5mg  before breakfast and 5mg  before lunch or dinner---- or cut down to 5mg  daily and see how her sugars are If she has recurrent low numbers, we need to decrease the dose

## 2014-04-20 ENCOUNTER — Other Ambulatory Visit: Payer: Self-pay | Admitting: *Deleted

## 2014-04-20 NOTE — Telephone Encounter (Signed)
01/19/14 tramadol

## 2014-04-20 NOTE — Telephone Encounter (Signed)
Spoke with patient and this is only the 2nd time the numbers have been low.

## 2014-04-21 ENCOUNTER — Other Ambulatory Visit: Payer: Self-pay | Admitting: Internal Medicine

## 2014-04-21 ENCOUNTER — Telehealth: Payer: Self-pay | Admitting: Internal Medicine

## 2014-04-21 MED ORDER — ONDANSETRON HCL 4 MG PO TABS: 4.0000 mg | ORAL_TABLET | Freq: Three times a day (TID) | ORAL | Status: DC | PRN

## 2014-04-21 MED ORDER — TRAMADOL HCL 50 MG PO TABS: 50.0000 mg | ORAL_TABLET | Freq: Two times a day (BID) | ORAL | Status: DC | PRN

## 2014-04-21 NOTE — Telephone Encounter (Signed)
Patient said she called yesterday about a prescription for Nausea and Tramadol.  Patient said she spoke to [facility]. And they said they haven't received the prescription.  Please call patient back when prescription is called in.

## 2014-04-21 NOTE — Telephone Encounter (Signed)
Okay #60 x 0 for tramadol  #40 x 0 for ondansetron

## 2014-04-21 NOTE — Telephone Encounter (Signed)
rx sent to pharmacy by e-script rx called into pharmacy  

## 2014-05-19 ENCOUNTER — Ambulatory Visit (INDEPENDENT_AMBULATORY_CARE_PROVIDER_SITE_OTHER): Payer: 59 | Admitting: Internal Medicine

## 2014-05-19 ENCOUNTER — Encounter: Payer: Self-pay | Admitting: Internal Medicine

## 2014-05-19 VITALS — BP 110/70 | HR 97 | Temp 98.2°F | Wt 164.0 lb

## 2014-05-19 DIAGNOSIS — M25569 Pain in unspecified knee: Secondary | ICD-10-CM

## 2014-05-19 DIAGNOSIS — K529 Noninfective gastroenteritis and colitis, unspecified: Secondary | ICD-10-CM

## 2014-05-19 DIAGNOSIS — E119 Type 2 diabetes mellitus without complications: Secondary | ICD-10-CM

## 2014-05-19 DIAGNOSIS — K5289 Other specified noninfective gastroenteritis and colitis: Secondary | ICD-10-CM

## 2014-05-19 DIAGNOSIS — M25561 Pain in right knee: Secondary | ICD-10-CM

## 2014-05-19 NOTE — Progress Notes (Signed)
Pre visit review using our clinic review tool, if applicable. No additional management support is needed unless otherwise documented below in the visit note. 

## 2014-05-19 NOTE — Assessment & Plan Note (Signed)
Improved now.   

## 2014-05-19 NOTE — Progress Notes (Signed)
Subjective:    Patient ID: Shannon [name], female    DOB: 06/14/1956, 58 y.o.   MRN: [id number]  HPI Doing well Bowel is not normal--- but much better No blood Still occasional pain and cramping Some belching as well Symptoms are usually post prandial  Does use simethicone Also still on probiotic  Has been checking sugars AM usually under 160 PM better---often under 100 Has had some mild low sugar reactions---if she forgets to eat  Current Outpatient Prescriptions on File Prior to Visit  Medication Sig Dispense Refill  . bimatoprost (LUMIGAN) 0.03 % ophthalmic drops 1 drop at bedtime.        Marland Kitchen glipiZIDE (GLUCOTROL XL) 10 MG 24 hr tablet Take 1 tablet (10 mg total) by mouth daily with breakfast.  90 tablet  3  . glucose blood (BAYER CONTOUR NEXT TEST) test strip Check blood sugar once daily and as needed. 250.00  100 each  3  . metoprolol succinate (TOPROL-XL) 25 MG 24 hr tablet Take 1 tablet by mouth  daily  90 tablet  3  . ondansetron (ZOFRAN) 4 MG tablet Take 1 tablet (4 mg total) by mouth 3 (three) times daily as needed for nausea or vomiting.  40 tablet  0  . pantoprazole (PROTONIX) 40 MG tablet Take 1 tablet by mouth  daily  90 tablet  3  . traMADol (ULTRAM) 50 MG tablet Take 1 tablet (50 mg total) by mouth 2 (two) times daily as needed.  60 tablet  0  . traZODone (DESYREL) 100 MG tablet Take 2 tablets by mouth  every night at bedtime  180 tablet  3  . triamterene-hydrochlorothiazide (MAXZIDE-25) 37.5-25 MG per tablet TAKE ONE TABLET BY MOUTH ONCE DAILY  90 tablet  2   No current facility-administered medications on file prior to visit.    Allergies  Allergen Reactions  . Clarithromycin   . Lisinopril     REACTION: cough  . Nitrofurantoin   . Oxycodone-Acetaminophen     REACTION: itching    Past Medical History  Diagnosis Date  . Asthma   . GERD (gastroesophageal reflux disease)   . Hypertension   . Osteopenia   . Mitral valve prolapse   . Glaucoma suspect    . Sleep disturbance   . Allergy   . Depression   . Shingles 11/2006  . Diabetes mellitus 11/12  . Hyperlipidemia     Past Surgical History  Procedure Laterality Date  . Abdominal hysterectomy  2000    /BSO - abn. Paps  . Appendectomy  1978  . Breast enhancement surgery  1987  . Bladder repair      x2  . Rectal prolapse repair      x1  . Nm myoview ltd  03/2005    EF 56%, no ischemia  . Shoulder surgery  09/2008, 01/2009    Right shoulder surgery, then redone with lysis of adhesions  . Cervical discectomy  11/2009    with synthetic discs inserted--  Dr Arnoldo Morale  . Cardiac catheterization      [facility]    Family History  Problem Relation Age of Onset  . Diabetes Mother   . Heart disease Mother   . Hypertension Mother   . Ovarian cancer Mother   . Melanoma Mother   . Cancer Father   . Coronary artery disease Father   . Heart disease Father   . Diabetes Sister   . Depression Sister   . Coronary artery disease Sister   .  Heart disease Sister   . Rheum arthritis Maternal Aunt   . Breast cancer Maternal Grandmother   . Diabetes Maternal Grandmother   . Diabetes Maternal Grandfather   . Colon cancer Neg Hx   . Heart attack Paternal Uncle   . Heart attack Cousin   . Heart attack Paternal Uncle     History   Social History  . Marital Status: Married    Spouse Name: N/A    Number of Children: 33  . Years of Education: N/A   Occupational History  . homemaker    Social History Main Topics  . Smoking status: Never Smoker   . Smokeless tobacco: Never Used  . Alcohol Use: No  . Drug Use: No  . Sexual Activity: Not on file   Other Topics Concern  . Not on file   Social History Narrative   Home maker   Doesn't exercise      [name] sister         Review of Systems Appetite back to normal Weight is up 2#    Objective:   Physical Exam  Constitutional: She appears well-developed and well-nourished. No distress.  Abdominal: Soft. Bowel sounds are  normal. She exhibits no mass. There is no rebound and no guarding.  Mild LLQ tenderness but much better          Assessment & Plan:

## 2014-05-19 NOTE — Assessment & Plan Note (Signed)
Uses tramadol rarely

## 2014-05-19 NOTE — Assessment & Plan Note (Signed)
Will try back with low dose metformin If more low sugar reactions, cut glipizide in half

## 2014-05-19 NOTE — Patient Instructions (Signed)
Please restart 1 metformin in the morning. If you have significant low sugar reactions, cut the glipizide in half (to 5mg  daily)

## 2014-05-26 ENCOUNTER — Other Ambulatory Visit: Payer: Self-pay | Admitting: *Deleted

## 2014-05-26 MED ORDER — GLUCOSE BLOOD VI STRP: ORAL_STRIP | Status: DC

## 2014-05-26 MED ORDER — ONETOUCH BASIC SYSTEM W/DEVICE KIT: PACK | Status: DC

## 2014-05-26 MED ORDER — ONETOUCH DELICA LANCETS FINE MISC: Status: DC

## 2014-05-26 NOTE — Telephone Encounter (Signed)
rx sent to pharmacy by e-script Patient's insurance will only cover [facility]

## 2014-05-29 ENCOUNTER — Ambulatory Visit (INDEPENDENT_AMBULATORY_CARE_PROVIDER_SITE_OTHER): Payer: 59 | Admitting: Internal Medicine

## 2014-05-29 ENCOUNTER — Encounter: Payer: Self-pay | Admitting: Internal Medicine

## 2014-05-29 VITALS — BP 118/70 | HR 84 | Temp 97.8°F | Wt 162.0 lb

## 2014-05-29 DIAGNOSIS — R1013 Epigastric pain: Secondary | ICD-10-CM

## 2014-05-29 MED ORDER — SUCRALFATE 1 G PO TABS: 1.0000 g | ORAL_TABLET | Freq: Three times a day (TID) | ORAL | Status: DC

## 2014-05-29 NOTE — Progress Notes (Signed)
Subjective:    Patient ID: Shannon [name], female    DOB: 29-Nov-1955, 58 y.o.   MRN: [id number]  HPI Having some stomach trouble--- mostly in last week and picking up Worse after eating Not like with colitis  Bowels slower but still bid ---no blood or pain  Appetite is fine---but "starting to dread eating"  Just started the metformin a week ago  Not really heartburn No water brash  Sugars are some better back on the metformin May have been some better even before that  Current Outpatient Prescriptions on File Prior to Visit  Medication Sig Dispense Refill  . bimatoprost (LUMIGAN) 0.03 % ophthalmic drops 1 drop at bedtime.        . Blood Glucose Monitoring Suppl (ONE TOUCH BASIC SYSTEM) W/DEVICE KIT Check blood sugar once daily and as needed. 250.00  1 each  0  . glipiZIDE (GLUCOTROL XL) 10 MG 24 hr tablet Take 1 tablet (10 mg total) by mouth daily with breakfast.  90 tablet  3  . glucose blood (ONE TOUCH TEST STRIPS) test strip Check blood sugar once daily and as needed. 250.00  100 each  1  . metFORMIN (GLUCOPHAGE-XR) 500 MG 24 hr tablet Take 500 mg by mouth daily with breakfast.      . metoprolol succinate (TOPROL-XL) 25 MG 24 hr tablet Take 1 tablet by mouth  daily  90 tablet  3  . ondansetron (ZOFRAN) 4 MG tablet Take 1 tablet (4 mg total) by mouth 3 (three) times daily as needed for nausea or vomiting.  40 tablet  0  . ONETOUCH DELICA LANCETS FINE MISC Check blood sugar once daily and as needed. 250.00  100 each  1  . pantoprazole (PROTONIX) 40 MG tablet Take 1 tablet by mouth  daily  90 tablet  3  . traMADol (ULTRAM) 50 MG tablet Take 1 tablet (50 mg total) by mouth 2 (two) times daily as needed.  60 tablet  0  . traZODone (DESYREL) 100 MG tablet Take 2 tablets by mouth  every night at bedtime  180 tablet  3  . triamterene-hydrochlorothiazide (MAXZIDE-25) 37.5-25 MG per tablet TAKE ONE TABLET BY MOUTH ONCE DAILY  90 tablet  2   No current facility-administered medications  on file prior to visit.    Allergies  Allergen Reactions  . Clarithromycin   . Lisinopril     REACTION: cough  . Nitrofurantoin   . Oxycodone-Acetaminophen     REACTION: itching    Past Medical History  Diagnosis Date  . Asthma   . GERD (gastroesophageal reflux disease)   . Hypertension   . Osteopenia   . Mitral valve prolapse   . Glaucoma suspect   . Sleep disturbance   . Allergy   . Depression   . Shingles 11/2006  . Diabetes mellitus 11/12  . Hyperlipidemia     Past Surgical History  Procedure Laterality Date  . Abdominal hysterectomy  2000    /BSO - abn. Paps  . Appendectomy  1978  . Breast enhancement surgery  1987  . Bladder repair      x2  . Rectal prolapse repair      x1  . Nm myoview ltd  03/2005    EF 56%, no ischemia  . Shoulder surgery  09/2008, 01/2009    Right shoulder surgery, then redone with lysis of adhesions  . Cervical discectomy  11/2009    with synthetic discs inserted--  Dr Arnoldo Morale  . [facility]  [facility]    Family History  Problem Relation Age of Onset  . Diabetes Mother   . Heart disease Mother   . Hypertension Mother   . Ovarian cancer Mother   . Melanoma Mother   . Cancer Father   . Coronary artery disease Father   . Heart disease Father   . Diabetes Sister   . Depression Sister   . Coronary artery disease Sister   . Heart disease Sister   . Rheum arthritis Maternal Aunt   . Breast cancer Maternal Grandmother   . Diabetes Maternal Grandmother   . Diabetes Maternal Grandfather   . Colon cancer Neg Hx   . Heart attack Paternal Uncle   . Heart attack Cousin   . Heart attack Paternal Uncle     History   Social History  . Marital Status: Married    Spouse Name: N/A    Number of Children: 53  . Years of Education: N/A   Occupational History  . homemaker    Social History Main Topics  . Smoking status: Never Smoker   . Smokeless tobacco: Never Used  . Alcohol Use: No  . Drug Use: No  . Sexual  Activity: Not on file   Other Topics Concern  . Not on file   Social History Narrative   Home maker   Doesn't exercise      [name] sister         Review of Systems No cough  No fever No SOB     Objective:   Physical Exam  Constitutional: She appears well-developed and well-nourished. No distress.  Neck: Normal range of motion. Neck supple. No thyromegaly present.  Pulmonary/Chest: Effort normal and breath sounds normal. No respiratory distress. She has no wheezes. She has no rales.  Abdominal: Soft. Bowel sounds are normal. She exhibits no distension and no mass. There is no rebound and no guarding.  Mild right sided tenderness (stable) Moderate epigastric tenderness  Lymphadenopathy:    She has no cervical adenopathy.          Assessment & Plan:

## 2014-05-29 NOTE — Patient Instructions (Signed)
Please stop the metformin. Start the sucralfate --but you can stop it if your symptoms improve. If your symptoms aren't better in a few days, increase the protonix to twice a day. Let me know if you are not better in the next 1-2 weeks.

## 2014-05-29 NOTE — Assessment & Plan Note (Signed)
Could be from the metformin---will stop and see if that makes things better Gastritis in past---will add sucralfate (took in past)---at least temporarily If pain not better quickly, will increase protonix to bid May need referral back to GI (prefers a new GI than [facility]--would use [facility])

## 2014-05-29 NOTE — Progress Notes (Signed)
Pre visit review using our clinic review tool, if applicable. No additional management support is needed unless otherwise documented below in the visit note. 

## 2014-06-11 ENCOUNTER — Encounter: Payer: Self-pay | Admitting: Internal Medicine

## 2014-06-18 ENCOUNTER — Other Ambulatory Visit: Payer: 59

## 2014-08-10 ENCOUNTER — Other Ambulatory Visit: Payer: Self-pay | Admitting: Internal Medicine

## 2014-08-10 NOTE — Telephone Encounter (Signed)
04/21/14 

## 2014-08-11 NOTE — Telephone Encounter (Signed)
rx sent to pharmacy by e-script rx called into pharmacy  

## 2014-08-11 NOTE — Telephone Encounter (Signed)
Okay #40 x 0 for ondansetron and #60 x 0 for tramadol

## 2014-09-09 ENCOUNTER — Telehealth: Payer: Self-pay | Admitting: Internal Medicine

## 2014-09-09 NOTE — Telephone Encounter (Signed)
Pt advised as instructed and pt voiced understanding but pt said metformin was stopped several months ago due to stomach issues by Dr Silvio Pate when glipizide 10 mg one in AM was started. pts bowels have been fine since stopping metformin. Pt is willing to restart metformin if Dr Silvio Pate wants her to restart but pt wants to know if should restart metformin 500 mg one tab at breakfast rather than 2 tabs since pt has not been taking med for several months. Pt does not need refills on metformin or glipizide. [place] Pt request cb.

## 2014-09-09 NOTE — Telephone Encounter (Signed)
I thought that she had restarted the 1 per day. Since she hasn't, just have her try the once daily and increase the glipizide to 10 bid

## 2014-09-09 NOTE — Telephone Encounter (Signed)
Please have her increase the metformin back to 2 at breakfast if her bowels have been okay Also she should increase the glipizide to 10mg  bid (send new Rx for both if needed) She should check daily for a while to be sure they are coming down--- and let us know if it is still over 200 in the next week or so

## 2014-09-09 NOTE — Telephone Encounter (Signed)
Spoke with patient and advised results, she will try this and call if anything changes

## 2014-09-09 NOTE — Telephone Encounter (Signed)
Patient Information:  Caller Name: Shannon [name]  Phone: [phone]  Patient: Shannon [name], Shannon [name]  Gender: Female  DOB: [birth year]  Age: 58 Years  PCP: Viviana Simpler [facility])  Office Follow Up:  Does the office need to follow up with this patient?: Yes  Instructions For The Office: Please call and advise. Can her medications be adjusted until there is an open appt.  RN Note:  Appts are full today. Pt wants to know if she should be put on insulin at this point/it was discussed at the last visit.  Symptoms  Reason For Call & Symptoms: Pt is calling about her blood sugars being high over the past week. Pt tells RN her fasting sugar this am was 211. She had not been checking them for 2 months or so because of family issues and stress. Pt began to feel nauseated and have times of feeling sweaty and shaky. Before dinner on 10/17 and 10/18 her readings were 300-309.  Pt takes Glypizide QD after breakfast. She checks a fasting sugar and a pm sugar only.  Reviewed Health History In EMR: Yes  Reviewed Medications In EMR: Yes  Reviewed Allergies In EMR: Yes  Reviewed Surgeries / Procedures: Yes  Date of Onset of Symptoms: 09/02/2014  Guideline(s) Used:  Diabetes - High Blood Sugar  Disposition Per Guideline:   Discuss with PCP and Callback by Nurse within 1 Hour  Reason For Disposition Reached:   Blood glucose > 300 mg/dl (16.5 mmol/l) AND two or more times in a row  Advice Given:  N/A  Patient Will Follow Care Advice:  YES

## 2014-09-16 ENCOUNTER — Telehealth: Payer: Self-pay | Admitting: Internal Medicine

## 2014-09-16 NOTE — Telephone Encounter (Signed)
Patient Information:  Caller Name: Shannon [name]  Phone: [phone]  Patient: Shannon [name], Shannon [name]  Gender: Female  DOB: 03-Mar-1956  Age: 58 Years  PCP: Shannon [name] [facility])  Office Follow Up:  Does the office need to follow up with this patient?: No  Instructions For The Office: N/A  RN Note:  She had appt scheduled with Dr. Silvio Pate but not until next week, 11/3. Advised appt today or tomorrow for sxs and appt made for tomorrow (appts today are full) with Dr. Lorelei Pont at 2pm. Agreed to plan.  Symptoms  Reason For Call & Symptoms: cold sxs: sinus congestion/pressure with yellow/green thick nasal drainage and mild dry cough onset appprox 1.5-2wks ago, with increased blood glucose levels. States she called last week and was advised on some med increases (Metformin and Glypizide) by Dr. Silvio Pate and she has been following those instructions but BG readings are still high for her (163 fasting today and 245 after eating) although they are somewhat improved as they were in the 300s before the med increases. She is concerned that BG still high and that sinus sxs are still present. Does state headache is improving and although she felt febrile last week, does not have fever this week, but states "I just feel miserable" and she feels like she has a sinus infection.  Reviewed Health History In EMR: Yes  Reviewed Medications In EMR: Yes  Reviewed Allergies In EMR: Yes  Reviewed Surgeries / Procedures: Yes  Date of Onset of Symptoms: 09/05/2014  Guideline(s) Used:  Sinus Pain and Congestion  Disposition Per Guideline:   See Today or Tomorrow in Office  Reason For Disposition Reached:   Sinus congestion (pressure, fullness) present > 10 days  Advice Given:  For a Runny Nose With Profuse Discharge:  Nasal mucus and discharge helps to wash viruses and bacteria out of the nose and sinuses.  Blowing the nose is all that is needed.  For a Stuffy Nose - Use Nasal Washes:  Introduction: Saline (salt  water) nasal irrigation (nasal wash) is an effective and simple home remedy for treating stuffy nose and sinus congestion. The nose can be irrigated by pouring, spraying, or squirting salt water into the nose and then letting it run back out.  How it Helps: The salt water rinses out excess mucus, washes out any irritants (dust, allergens) that might be present, and moistens the nasal cavity.  Methods: There are several ways to perform nasal irrigation. You can use a saline nasal spray bottle (available over-the-counter), a rubber ear syringe, a medical syringe without the needle, or a Neti Pot.  Hydration:  Drink plenty of liquids (6-8 glasses of water daily). If the air in your home is dry, use a cool mist humidifier  Expected Course:  Sinus congestion from viral upper respiratory infections (colds) usually lasts 5-10 days.  Occasionally a cold can worsen and turn into bacterial sinusitis. Clues to this are sinus symptoms lasting longer than 10 days, fever lasting longer than 3 days, and worsening pain. Bacterial sinusitis may need antibiotic treatment.  Call Back If:   You become worse.  Patient Will Follow Care Advice:  YES  Appointment Scheduled:  09/17/2014 14:00:00 Appointment Scheduled Provider:  Owens Loffler [facility])

## 2014-09-17 ENCOUNTER — Ambulatory Visit (INDEPENDENT_AMBULATORY_CARE_PROVIDER_SITE_OTHER): Payer: 59 | Admitting: Family Medicine

## 2014-09-17 ENCOUNTER — Encounter: Payer: Self-pay | Admitting: Family Medicine

## 2014-09-17 VITALS — BP 138/78 | HR 102 | Temp 97.9°F | Ht 64.0 in | Wt 169.5 lb

## 2014-09-17 DIAGNOSIS — E1165 Type 2 diabetes mellitus with hyperglycemia: Secondary | ICD-10-CM

## 2014-09-17 DIAGNOSIS — IMO0001 Reserved for inherently not codable concepts without codable children: Secondary | ICD-10-CM

## 2014-09-17 DIAGNOSIS — J0100 Acute maxillary sinusitis, unspecified: Secondary | ICD-10-CM

## 2014-09-17 MED ORDER — AMOXICILLIN-POT CLAVULANATE 875-125 MG PO TABS: 1.0000 | ORAL_TABLET | Freq: Two times a day (BID) | ORAL | Status: DC

## 2014-09-17 NOTE — Progress Notes (Signed)
Dr. Frederico Hamman T. Vickey Boak, MD, [facility] [place], [address] Phone: [phone] Fax: [phone]  09/17/2014  Patient: Shannon [name], MRN: [id number], DOB: September 25, 1956, 58 y.o.  Primary Physician:  Viviana Simpler, MD  Chief Complaint: Cough, Nasal Congestion, Headache and Hyperglycemia  Subjective:   This 58 y.o. female patient presents with runny nose, sneezing, cough, sore throat, malaise and minimal / low-grade fever for  weeks week. Now the primary complaint has become sinus pressure and pain behind the eyes and in the upper, anterior face.   Sick for 2 weeks. Congestion, headache, cough. Blowing bad stuff out of head.   252 this AM. Currently taking metformin XR, 1 daily.  Very recently increased to glipizide twice a day  Lab Results  Component Value Date   HGBA1C 8.0* 03/31/2014    The patent denies sore throat as the primary complaint. Denies sthortness of breath/wheezing, high fever, chest pain, significant myalgia, otalgia, abdominal pain, changes in bowel or bladder.  PMH, PHS, Allergies, Problem List, Medications, Family History, and Social History have all been reviewed.  ROS as above, eating and drinking - tolerating PO. Urinating normally. No excessive vomitting or diarrhea. O/w as above.  Objective:   Blood pressure 138/78, pulse 102, temperature 97.9 F (36.6 C), temperature source Oral, height '5\' 4"'  (1.626 m), weight 169 lb 8 oz (76.885 kg), SpO2 95.00%.  GEN: WDWN, Non-toxic, Atraumatic, normocephalic. A and O x 3. HEENT: Oropharynx clear without exudate, MMM, no significant LAD, mild rhinnorhea Sinuses: Right Frontal, ethmoid, and maxillary: Tender maxillary Left Frontal, Ethmoid, and maxillary: Tendermaxillary Ears: TM clear, COL visualized with good landmarks CV: RRR, no m/g/r. Pulm: CTA B, no wheezes, rhonchi, or crackles, normal respiratory effort. EXT: no c/c/e Psych: well oriented,  neither depressed nor anxious in appearance  Assessment and Plan:   Acute maxillary sinusitis, recurrence not specified  Diabetes mellitus type 2, uncontrolled, without complications  Acute sinusitis: ABX as below.  2 weeks of symptoms Reviewed symptomatic care as well as ABX in this case.   Alvester Chou recent increase in diabetic regimen.  Some blood sugar elevation may be due to current illness.  I recommended no alteration.  Follow-up: No Follow-up on file.  New Prescriptions   AMOXICILLIN-CLAVULANATE (AUGMENTIN) 875-125 MG PER TABLET    Take 1 tablet by mouth 2 (two) times daily.   No orders of the defined types were placed in this encounter.    Signed,  Maud Deed. Onnika Siebel, MD  Patient's Medications  New Prescriptions   AMOXICILLIN-CLAVULANATE (AUGMENTIN) 875-125 MG PER TABLET    Take 1 tablet by mouth 2 (two) times daily.  Previous Medications   BIMATOPROST (LUMIGAN) 0.03 % OPHTHALMIC DROPS    1 drop at bedtime.     BLOOD GLUCOSE MONITORING SUPPL (ONE TOUCH BASIC SYSTEM) W/DEVICE KIT    Check blood sugar once daily and as needed. 250.00   GLUCOSE BLOOD (ONE TOUCH TEST STRIPS) TEST STRIP    Check blood sugar once daily and as needed. 250.00   METFORMIN (GLUCOPHAGE-XR) 500 MG 24 HR TABLET    Take 500 mg by mouth daily with breakfast.   METOPROLOL SUCCINATE (TOPROL-XL) 25 MG 24 HR TABLET    Take 1 tablet by mouth  daily   ONDANSETRON (ZOFRAN) 4 MG TABLET    TAKE ONE TABLET BY MOUTH THREE TIMES DAILY AS NEEDED FOR NAUSEA OR  VOMITING   ONETOUCH DELICA LANCETS FINE MISC  Check blood sugar once daily and as needed. 250.00   PANTOPRAZOLE (PROTONIX) 40 MG TABLET    Take 1 tablet by mouth  daily   TRAMADOL (ULTRAM) 50 MG TABLET    TAKE ONE TABLET BY MOUTH TWICE DAILY AS NEEDED   TRAZODONE (DESYREL) 100 MG TABLET    Take 2 tablets by mouth  every night at bedtime   TRIAMTERENE-HYDROCHLOROTHIAZIDE (MAXZIDE-25) 37.5-25 MG PER TABLET    Take 1 tablet by mouth once a day   VALACYCLOVIR  (VALTREX) 1000 MG TABLET      Modified Medications   Modified Medication Previous Medication   GLIPIZIDE (GLUCOTROL XL) 10 MG 24 HR TABLET glipiZIDE (GLUCOTROL XL) 10 MG 24 hr tablet      Take 10 mg by mouth 2 (two) times daily.    Take 1 tablet (10 mg total) by mouth daily with breakfast.  Discontinued Medications   SUCRALFATE (CARAFATE) 1 G TABLET    Take 1 tablet (1 g total) by mouth 4 (four) times daily -  with meals and at bedtime.

## 2014-09-17 NOTE — Progress Notes (Signed)
Pre visit review using our clinic review tool, if applicable. No additional management support is needed unless otherwise documented below in the visit note. 

## 2014-09-17 NOTE — Patient Instructions (Signed)
Take Guaifenesin (400mg ), take 11/2 tabs by mouth AM and NOON. Get GUAIFENESIN by  going to [facility] [place] or [facility] and getting MUCOUS RELIEF EXPECTORANT/CONGESTION.

## 2014-09-22 ENCOUNTER — Ambulatory Visit (INDEPENDENT_AMBULATORY_CARE_PROVIDER_SITE_OTHER): Payer: 59 | Admitting: Internal Medicine

## 2014-09-22 ENCOUNTER — Encounter: Payer: Self-pay | Admitting: Internal Medicine

## 2014-09-22 VITALS — BP 110/80 | HR 87 | Temp 97.7°F | Wt 168.0 lb

## 2014-09-22 DIAGNOSIS — IMO0001 Reserved for inherently not codable concepts without codable children: Secondary | ICD-10-CM

## 2014-09-22 DIAGNOSIS — Z23 Encounter for immunization: Secondary | ICD-10-CM

## 2014-09-22 DIAGNOSIS — E1165 Type 2 diabetes mellitus with hyperglycemia: Secondary | ICD-10-CM

## 2014-09-22 LAB — HEMOGLOBIN A1C: Hgb A1c MFr Bld: 8.6 % — ABNORMAL HIGH (ref 4.6–6.5)

## 2014-09-22 NOTE — Addendum Note (Signed)
Addended by: Despina Hidden on: 09/22/2014 12:57 PM   Modules accepted: Orders

## 2014-09-22 NOTE — Patient Instructions (Addendum)
I will let you know if I would like you to increase the metformin to 1000mg  daily in AM based on the A1c. Let me know if your fasting sugars stay over 150 For now, continue the glipizide twice a day and one metformin in AM

## 2014-09-22 NOTE — Progress Notes (Signed)
Pre visit review using our clinic review tool, if applicable. No additional management support is needed unless otherwise documented below in the visit note. 

## 2014-09-22 NOTE — Progress Notes (Signed)
Subjective:    Patient ID: Shannon [name], female    DOB: 07/06/1956, 58 y.o.   MRN: [id number]  HPI Having trouble with her sugars Sinuses are some better now  Restarted the metformin XL on 10/22 Increased the glipizide PM dose on 10/26 Sugars are now trending downward Had been over 200 in AM and later in day also Now 156/151 in AM All under 200 for 3 days now  Wasn't checking for a while--but was high when she checked  Bowels have been fine still  Current Outpatient Prescriptions on File Prior to Visit  Medication Sig Dispense Refill  . amoxicillin-clavulanate (AUGMENTIN) 875-125 MG per tablet Take 1 tablet by mouth 2 (two) times daily. 20 tablet 0  . bimatoprost (LUMIGAN) 0.03 % ophthalmic drops 1 drop at bedtime.      Marland Kitchen glipiZIDE (GLUCOTROL XL) 10 MG 24 hr tablet Take 10 mg by mouth 2 (two) times daily.    Marland Kitchen glucose blood (ONE TOUCH TEST STRIPS) test strip Check blood sugar once daily and as needed. 250.00 100 each 1  . metFORMIN (GLUCOPHAGE-XR) 500 MG 24 hr tablet Take 500 mg by mouth daily with breakfast.    . metoprolol succinate (TOPROL-XL) 25 MG 24 hr tablet Take 1 tablet by mouth  daily 90 tablet 3  . ondansetron (ZOFRAN) 4 MG tablet TAKE ONE TABLET BY MOUTH THREE TIMES DAILY AS NEEDED FOR NAUSEA OR  VOMITING 40 tablet 0  . ONETOUCH DELICA LANCETS FINE MISC Check blood sugar once daily and as needed. 250.00 100 each 1  . pantoprazole (PROTONIX) 40 MG tablet Take 1 tablet by mouth  daily 90 tablet 3  . traMADol (ULTRAM) 50 MG tablet TAKE ONE TABLET BY MOUTH TWICE DAILY AS NEEDED 60 tablet 0  . traZODone (DESYREL) 100 MG tablet Take 2 tablets by mouth  every night at bedtime 180 tablet 3  . triamterene-hydrochlorothiazide (MAXZIDE-25) 37.5-25 MG per tablet Take 1 tablet by mouth once a day 90 tablet 3  . valACYclovir (VALTREX) 1000 MG tablet      No current facility-administered medications on file prior to visit.    Allergies  Allergen Reactions  . Clarithromycin     . Lisinopril     REACTION: cough  . Nitrofurantoin   . Oxycodone-Acetaminophen     REACTION: itching    Past Medical History  Diagnosis Date  . Asthma   . GERD (gastroesophageal reflux disease)   . Hypertension   . Osteopenia   . Mitral valve prolapse   . Glaucoma suspect   . Sleep disturbance   . Allergy   . Depression   . Shingles 11/2006  . Diabetes mellitus 11/12  . Hyperlipidemia     Past Surgical History  Procedure Laterality Date  . Abdominal hysterectomy  2000    /BSO - abn. Paps  . Appendectomy  1978  . Breast enhancement surgery  1987  . Bladder repair      x2  . Rectal prolapse repair      x1  . Nm myoview ltd  03/2005    EF 56%, no ischemia  . Shoulder surgery  09/2008, 01/2009    Right shoulder surgery, then redone with lysis of adhesions  . Cervical discectomy  11/2009    with synthetic discs inserted--  Dr Arnoldo Morale  . Cardiac catheterization      [facility]    Family History  Problem Relation Age of Onset  . Diabetes Mother   . Heart disease Mother   .  Hypertension Mother   . Ovarian cancer Mother   . Melanoma Mother   . Cancer Father   . Coronary artery disease Father   . Heart disease Father   . Diabetes Sister   . Depression Sister   . Coronary artery disease Sister   . Heart disease Sister   . Rheum arthritis Maternal Aunt   . Breast cancer Maternal Grandmother   . Diabetes Maternal Grandmother   . Diabetes Maternal Grandfather   . Colon cancer Neg Hx   . Heart attack Paternal Uncle   . Heart attack Cousin   . Heart attack Paternal Uncle     History   Social History  . Marital Status: Married    Spouse Name: N/A    Number of Children: 72  . Years of Education: N/A   Occupational History  . homemaker    Social History Main Topics  . Smoking status: Never Smoker   . Smokeless tobacco: Never Used  . Alcohol Use: No  . Drug Use: No  . Sexual Activity: Not on file   Other Topics Concern  . Not on file   Social History  Narrative   Home maker   Doesn't exercise      [name] sister         Review of Systems  Drinks a lot of water--but no excessive thirst No polyuria Notified by Dr Erin Sons for EGD and colon    Objective:   Physical Exam  Constitutional: She appears well-developed. No distress.  Psychiatric: She has a normal mood and affect. Her behavior is normal.          Assessment & Plan:

## 2014-09-22 NOTE — Assessment & Plan Note (Signed)
Mostly went out of control when off metformin for possible relationship to her colitis Now is better Tolerating 1 metformin thus far and sugars better Due for A1c  Will consider doubling metformin dose if sugars stay up or A1c really high (like over 9%) Counseled all of 15 minute visit

## 2014-09-29 ENCOUNTER — Telehealth: Payer: Self-pay | Admitting: Internal Medicine

## 2014-09-29 NOTE — Telephone Encounter (Signed)
Patient Information:  Caller Name: [name]  Phone: [phone]  Patient: Shannon [name], Shannon [name]  Gender: Female  DOB: 27-Oct-1956  Age: 58 Years  PCP: Viviana Simpler [facility])  Office Follow Up:  Does the office need to follow up with this patient?: No  Instructions For The Office: N/A  RN Note:  No appts available for today at [facility] or [place] office. Appt scheduled for tomorrow(09/30/14) @ 1215 with Dr.Letvak. Home care advice given.  Symptoms  Reason For Call & Symptoms: Calling about sore throat, nasal congestion, headache, SOB on occas, cough, ear "stopped up" on right and sinus pressure on right of face. Was seen at the office on 09/17/14 for same sxs, given an antibiotic--sxs got better but have come back and are getting worse. Has tried home treatments and OTC meds without effect.  Reviewed Health History In EMR: Yes  Reviewed Medications In EMR: Yes  Reviewed Allergies In EMR: Yes  Reviewed Surgeries / Procedures: Yes  Date of Onset of Symptoms: 09/27/2014  Treatments Tried: nasal rinse,Guaifenesin,Ibuprofen,Chloraseptic spray  Treatments Tried Worked: No  Guideline(s) Used:  Colds  Disposition Per Guideline:   See Today or Tomorrow in Office  Reason For Disposition Reached:   Patient wants to be seen  Advice Given:  For a Stuffy Nose - Use Nasal Washes:  Introduction: Saline (salt water) nasal irrigation (nasal wash) is an effective and simple home remedy for treating stuffy nose and sinus congestion. The nose can be irrigated by pouring, spraying, or squirting salt water into the nose and then letting it run back out.  Treatment for Associated Symptoms of Colds:  For muscle aches, headaches, or moderate fever (more than 101 F or 38.9 C): Take acetaminophen every 4 hours.  Hydrate: Drink adequate liquids.  Humidifier:  If the air in your home is dry, use a cool-mist humidifier  Call Back If:  Difficulty breathing occurs  Fever lasts more than 3 days  You become worse  Patient Will Follow Care Advice:  YES  Appointment Scheduled:  09/30/2014 12:15:00 Appointment Scheduled Provider:  Viviana Simpler [facility])

## 2014-09-30 ENCOUNTER — Ambulatory Visit (INDEPENDENT_AMBULATORY_CARE_PROVIDER_SITE_OTHER): Payer: 59 | Admitting: Internal Medicine

## 2014-09-30 ENCOUNTER — Encounter: Payer: Self-pay | Admitting: Internal Medicine

## 2014-09-30 VITALS — BP 128/80 | HR 107 | Temp 97.5°F | Wt 166.0 lb

## 2014-09-30 DIAGNOSIS — J0101 Acute recurrent maxillary sinusitis: Secondary | ICD-10-CM

## 2014-09-30 DIAGNOSIS — J0100 Acute maxillary sinusitis, unspecified: Secondary | ICD-10-CM | POA: Insufficient documentation

## 2014-09-30 MED ORDER — LEVOFLOXACIN 500 MG PO TABS: 500.0000 mg | ORAL_TABLET | Freq: Every day | ORAL | Status: DC

## 2014-09-30 MED ORDER — METHYLPREDNISOLONE ACETATE 80 MG/ML IJ SUSP
80.0000 mg | Freq: Once | INTRAMUSCULAR | Status: AC
  Administered 2014-09-30: 80 mg via INTRAMUSCULAR

## 2014-09-30 NOTE — Patient Instructions (Signed)
Please start an over the counter nasal cortisone like flonase or nasacort---use 2 sprays in each nostril twice a day. You probably should use this for at least a week after your infection seems cleared

## 2014-09-30 NOTE — Telephone Encounter (Signed)
Will recheck then

## 2014-09-30 NOTE — Progress Notes (Signed)
Subjective:    Patient ID: Shannon [name], female    DOB: September 13, 1956, 58 y.o.   MRN: [id number]  HPI Has been sick for about a month Was seen here and took the augmentin Did feel a little better briefly but now worse again  Bad cough-- now with left chest pain pleuritic Drainage Sore throat--better with chloraseptic Nose so congested that nasal wash won't even go in Right ear feels full and with pressure Feels SOB---taking it very easy due to fatigue and dyspnea Has headache also  Fever earlier Did have one night of chills also  Tried zyrtec yesterday-- didn't help Some ibuprofen  Current Outpatient Prescriptions on File Prior to Visit  Medication Sig Dispense Refill  . bimatoprost (LUMIGAN) 0.03 % ophthalmic drops 1 drop at bedtime.      Marland Kitchen glipiZIDE (GLUCOTROL XL) 10 MG 24 hr tablet Take 10 mg by mouth 2 (two) times daily.    Marland Kitchen glucose blood (ONE TOUCH TEST STRIPS) test strip Check blood sugar once daily and as needed. 250.00 100 each 1  . metFORMIN (GLUCOPHAGE-XR) 500 MG 24 hr tablet Take 500 mg by mouth daily with breakfast.    . metoprolol succinate (TOPROL-XL) 25 MG 24 hr tablet Take 1 tablet by mouth  daily 90 tablet 3  . ondansetron (ZOFRAN) 4 MG tablet TAKE ONE TABLET BY MOUTH THREE TIMES DAILY AS NEEDED FOR NAUSEA OR  VOMITING 40 tablet 0  . ONETOUCH DELICA LANCETS FINE MISC Check blood sugar once daily and as needed. 250.00 100 each 1  . pantoprazole (PROTONIX) 40 MG tablet Take 1 tablet by mouth  daily 90 tablet 3  . traMADol (ULTRAM) 50 MG tablet TAKE ONE TABLET BY MOUTH TWICE DAILY AS NEEDED 60 tablet 0  . traZODone (DESYREL) 100 MG tablet Take 2 tablets by mouth  every night at bedtime 180 tablet 3  . triamterene-hydrochlorothiazide (MAXZIDE-25) 37.5-25 MG per tablet Take 1 tablet by mouth once a day 90 tablet 3  . valACYclovir (VALTREX) 1000 MG tablet      No current facility-administered medications on file prior to visit.    Allergies  Allergen Reactions    . Clarithromycin   . Lisinopril     REACTION: cough  . Nitrofurantoin   . Oxycodone-Acetaminophen     REACTION: itching    Past Medical History  Diagnosis Date  . Asthma   . GERD (gastroesophageal reflux disease)   . Hypertension   . Osteopenia   . Mitral valve prolapse   . Glaucoma suspect   . Sleep disturbance   . Allergy   . Depression   . Shingles 11/2006  . Diabetes mellitus 11/12  . Hyperlipidemia     Past Surgical History  Procedure Laterality Date  . Abdominal hysterectomy  2000    /BSO - abn. Paps  . Appendectomy  1978  . Breast enhancement surgery  1987  . Bladder repair      x2  . Rectal prolapse repair      x1  . Nm myoview ltd  03/2005    EF 56%, no ischemia  . Shoulder surgery  09/2008, 01/2009    Right shoulder surgery, then redone with lysis of adhesions  . Cervical discectomy  11/2009    with synthetic discs inserted--  Dr Arnoldo Morale  . Cardiac catheterization      Procedure [facility]    Family History  Problem Relation Age of Onset  . Diabetes Mother   . Heart disease Mother   .  Hypertension Mother   . Ovarian cancer Mother   . Melanoma Mother   . Cancer Father   . Coronary artery disease Father   . Heart disease Father   . Diabetes Sister   . Depression Sister   . Coronary artery disease Sister   . Heart disease Sister   . Rheum arthritis Maternal Aunt   . Breast cancer Maternal Grandmother   . Diabetes Maternal Grandmother   . Diabetes Maternal Grandfather   . Colon cancer Neg Hx   . Heart attack Paternal Uncle   . Heart attack Cousin   . Heart attack Paternal Uncle     History   Social History  . Marital Status: Married    Spouse Name: N/A    Number of Children: 52  . Years of Education: N/A   Occupational History  . homemaker    Social History Main Topics  . Smoking status: Never Smoker   . Smokeless tobacco: Never Used  . Alcohol Use: No  . Drug Use: No  . Sexual Activity: Not on file   Other Topics Concern  . Not on file    Social History Narrative   Home maker   Doesn't exercise      [name] sister         Review of Systems No rash No vomiting or diarrhea Appetite off some    Objective:   Physical Exam  Constitutional: She appears well-developed. No distress.  HENT:  Mouth/Throat: Oropharynx is clear and moist. No oropharyngeal exudate.  Marked nasal inflammation TMs normal Marked maxillary tenderness  Neck: Normal range of motion. Neck supple.  Non tender anterior cervical nodes  Pulmonary/Chest: Effort normal and breath sounds normal. No respiratory distress. She has no wheezes. She has no rales.          Assessment & Plan:

## 2014-09-30 NOTE — Assessment & Plan Note (Signed)
Failed augmentin Will try levafloxacin and add clinda if not better in 2 days Depomedrol IM and start nasal steroid

## 2014-09-30 NOTE — Addendum Note (Signed)
Addended by: Despina Hidden on: 09/30/2014 12:55 PM   Modules accepted: Orders

## 2014-09-30 NOTE — Progress Notes (Signed)
Pre visit review using our clinic review tool, if applicable. No additional management support is needed unless otherwise documented below in the visit note. 

## 2014-10-07 ENCOUNTER — Telehealth: Payer: Self-pay | Admitting: Internal Medicine

## 2014-10-07 NOTE — Telephone Encounter (Signed)
[place] notified as instructed by telephone.  Will call back with update tomorrow for Dr. Silvio Pate.

## 2014-10-07 NOTE — Telephone Encounter (Signed)
Patient Information:  Caller Name: Timika  Phone: [phone]  Patient: Verley, Pariseau  Gender: Female  DOB: [birth year]  Age: 58 Years  PCP: Viviana Simpler [facility])  Office Follow Up:  Does the office need to follow up with this patient?: Yes  Instructions For The Office: Please review - contact PMD, call patient with directions for diabetic medications, any changes in foods or times to eat  Can reach patient at [phone].   Symptoms  Reason For Call & Symptoms: Problems with low blood sugars since diabetic medication dose change -- Glucotrol increased to BID, added Metformin in am. Blood sugar 72 about 1 hour ago so had 3 glucose tabs then lunch and blood sugar back up now.  Does not always have meals on regular basis.   Episode of shaking, sweeating after supper on 11/15 and found BS 107. On 11/16 FBS 75.  Yesterday 11/17 blood sugar 68 before lunch..  Patient self stopped the evening dose of Glucotrol on 11/16.  Reviewed Health History In EMR: Yes  Reviewed Medications In EMR: Yes  Reviewed Allergies In EMR: Yes  Reviewed Surgeries / Procedures: Yes  Date of Onset of Symptoms: 10/03/2014  Treatments Tried: glucose tabs to get BS back up  Treatments Tried Worked: Yes  Guideline(s) Used:  Diabetes - Low Blood Sugar  Disposition Per Guideline:   Discuss with PCP and Callback by Nurse Today  Reason For Disposition Reached:   Evening (after bedtime snack) blood glucose < 100 mg/dl (5.6 mmol/l) and more than once in past week  Advice Given:  Definition  Low blood sugar (hypoglycemia) is defined as a blood glucose less than 70 mg/dl (3.9 mmol/l).  Symptoms of mild hypoglycemia: Shakiness, weakness, not thinking clearly, headache, trembling, sweating, dizziness, palpitations, and hunger.  Symptoms of severe hypoglycemia: Unable to speak, confusion, seizures, and coma.  Treatment  Milk (1 cup; 240 ml)  Orange juice (1/2 cup; 120 ml)  Pre-packaged juice box (1  box)  Table sugar or honey (3 teaspoons; 15 ml)  Glucose tablets (3 tablets)  Call Back If:  There is no improvement within 30 minutes  You become worse.  Patient Will Follow Care Advice:  YES

## 2014-10-07 NOTE — Telephone Encounter (Signed)
Drop back on glucotrol to her previous dose and watch glucose readings frequently  Hold pm glucotrol tonight if she is scheduled for a pm dose Update PCP tomorrow Call back if problems

## 2014-10-08 NOTE — Telephone Encounter (Signed)
Messages left.

## 2014-10-08 NOTE — Telephone Encounter (Signed)
Discussed with patient Taking the glipizide and metformin once a day Still having some low reactions  P: if staying low with symptoms, will cut the glipizide to just 5mg  a day

## 2014-10-13 ENCOUNTER — Telehealth: Payer: Self-pay | Admitting: Internal Medicine

## 2014-10-13 NOTE — Telephone Encounter (Signed)
Even has had to hold the metformin due to low sugars Discussed that this is consistent with the glipizide--- she should be able to continue the metformin daily once the reactions calm down

## 2014-10-13 NOTE — Telephone Encounter (Signed)
Stay off glipizide completely  Check sugars as needed, but running out of strips Can cut back once things settle down-- or recertify for increased number of strips due to fluctuating sugars

## 2014-10-13 NOTE — Telephone Encounter (Signed)
Pt is experiencing several BG lows and went down to 43 yesterday.  She says it was 129 fasting this morning and 40 mins after breakfast it went up to 190. She says she still feels weak.  She went off the glipizide 5 days ago but did take one yesterday after being off 4 days. She would like a c/b from you to discuss what she needs to do.  I offered her an apptmt w/another provider, but she says she would rather talk to you since you're familiar w/her situation. Thank you.

## 2014-11-04 DIAGNOSIS — E1165 Type 2 diabetes mellitus with hyperglycemia: Secondary | ICD-10-CM | POA: Insufficient documentation

## 2014-11-12 ENCOUNTER — Telehealth: Payer: Self-pay

## 2014-11-12 NOTE — Telephone Encounter (Signed)
PLEASE NOTE: All timestamps contained within this report are represented as [place] Standard Time. CONFIDENTIALTY NOTICE: This fax transmission is intended only for the addressee. It contains information that is legally privileged, confidential or otherwise protected from use or disclosure. If you are not the intended recipient, you are strictly prohibited from reviewing, disclosing, copying using or disseminating any of this information or taking any action in reliance on or regarding this information. If you have received this fax in error, please notify us immediately by telephone so that we can arrange for its return to [place]. Phone: [phone], Toll-Free: [phone], Fax: [phone] Page: 1 of 2 Call Id: [id number] Colman Patient Name: Shannon [name] Gender: Female DOB: 19-Nov-1956 Age: 58 Y 36 M 2 D Return Phone Number: [phone] (Primary), [phone] (Secondary) Address: [place]/State/Zip: [place] [address] Client [facility] - Client Client Site [place] - Day Physician Viviana Simpler Contact Type Call Call Type Triage / Clinical Relationship To Patient Self Return Phone Number [phone] (Primary) Chief Complaint Cold Symptom Initial Comment Caller states that she has cold symptoms and green flem PreDisposition Call Doctor Nurse Assessment Nurse: Melina Modena, RN, Estill Bamberg Date/Time Eilene Ghazi Time): 11/11/2014 4:32:13 PM Confirm and document reason for call. If symptomatic, describe symptoms. ---Caller states that she has cold symptoms and green phlem; sx started last night Has the patient traveled out of the country within the last 30 days? ---Not Applicable Does the patient require triage? ---Yes Related visit to physician within the last 2 weeks? ---N/A Does the PT have any chronic conditions? (i.e. diabetes, asthma,  etc.) ---Yes List chronic conditions. ---htn, diabetes Guidelines Guideline Title Affirmed Question Affirmed Notes Nurse Date/Time (Eastern Time) Common Cold Cold with no complications (all triage questions negative) Melina Modena, RN, Estill Bamberg 11/11/2014 4:35:24 PM Disp. Time Eilene Ghazi Time) Disposition Final User 11/11/2014 4:39:17 PM Home Care Yes [place], RN, Shelly Coss Understands: Yes Disagree/Comply: Comply Care Advice Given Per Guideline PLEASE NOTE: All timestamps contained within this report are represented as [place] Standard Time. CONFIDENTIALTY NOTICE: This fax transmission is intended only for the addressee. It contains information that is legally privileged, confidential or otherwise protected from use or disclosure. If you are not the intended recipient, you are strictly prohibited from reviewing, disclosing, copying using or disseminating any of this information or taking any action in reliance on or regarding this information. If you have received this fax in error, please notify us immediately by telephone so that we can arrange for its return to [place]. Phone: [phone], Toll-Free: [phone], Fax: [phone] Page: 2 of 2 Call Id: [id number] Care Advice Given Per Guideline HOME CARE: You should be able to treat this at home. FOR A STUFFY NOSE - USE NASAL WASHES: * Introduction: Saline (salt water) nasal irrigation (nasal wash) is an effective and simple home remedy for treating stuffy nose and sinus congestion. The nose can be irrigated by pouring, spraying, or squirting salt water into the nose and then letting it run back out. * Methods: There are several ways to perform nasal irrigation. You can use a saline nasal spray bottle (available over-the-counter), a rubber ear syringe, a medical syringe without the needle, or a NETI POT. EXPECTED COURSE: * Fever 2-3 days CALL BACK IF: * Fever lasts over 3 days * Runny nose lasts over 10 days * You become short of breath CARE ADVICE  given per Colds (Adult) guideline.  After Care Instructions Given Call Event Type User Date / Time Description

## 2014-11-12 NOTE — Telephone Encounter (Signed)
?    There is no note in this encounter?

## 2014-11-25 ENCOUNTER — Encounter: Payer: 59 | Admitting: Internal Medicine

## 2014-11-27 LAB — HM DIABETES EYE EXAM

## 2014-12-10 ENCOUNTER — Encounter: Payer: Self-pay | Admitting: Internal Medicine

## 2014-12-10 LAB — HM DIABETES EYE EXAM

## 2014-12-14 ENCOUNTER — Other Ambulatory Visit: Payer: Self-pay | Admitting: Internal Medicine

## 2014-12-14 ENCOUNTER — Encounter: Payer: Self-pay | Admitting: Internal Medicine

## 2014-12-14 NOTE — Telephone Encounter (Signed)
rx called into pharmacy

## 2014-12-14 NOTE — Telephone Encounter (Signed)
08/11/14 for both

## 2014-12-14 NOTE — Telephone Encounter (Signed)
Approved:okay #[phone] x 0 for both

## 2015-01-07 ENCOUNTER — Other Ambulatory Visit: Payer: Self-pay | Admitting: Internal Medicine

## 2015-01-14 ENCOUNTER — Encounter: Payer: Self-pay | Admitting: Internal Medicine

## 2015-01-14 ENCOUNTER — Ambulatory Visit (INDEPENDENT_AMBULATORY_CARE_PROVIDER_SITE_OTHER): Payer: 59 | Admitting: Internal Medicine

## 2015-01-14 ENCOUNTER — Telehealth: Payer: Self-pay | Admitting: Internal Medicine

## 2015-01-14 VITALS — BP 138/80 | HR 102 | Temp 99.0°F | Wt 168.0 lb

## 2015-01-14 DIAGNOSIS — K209 Esophagitis, unspecified without bleeding: Secondary | ICD-10-CM

## 2015-01-14 DIAGNOSIS — E1165 Type 2 diabetes mellitus with hyperglycemia: Secondary | ICD-10-CM

## 2015-01-14 DIAGNOSIS — IMO0001 Reserved for inherently not codable concepts without codable children: Secondary | ICD-10-CM

## 2015-01-14 MED ORDER — METFORMIN HCL ER 500 MG PO TB24: 1000.0000 mg | ORAL_TABLET | Freq: Every day | ORAL | Status: DC

## 2015-01-14 MED ORDER — OMEPRAZOLE-SODIUM BICARBONATE 40-1680 MG PO PACK
1.0000 | PACK | Freq: Two times a day (BID) | ORAL | Status: DC
Start: 2015-01-14 — End: 2015-04-23

## 2015-01-14 NOTE — Progress Notes (Signed)
Pre visit review using our clinic review tool, if applicable. No additional management support is needed unless otherwise documented below in the visit note. 

## 2015-01-14 NOTE — Assessment & Plan Note (Signed)
Along with gastritis and antritis protonix not helping---will switch PPI Note to Dr Vira Agar for his review and recommendations

## 2015-01-14 NOTE — Telephone Encounter (Signed)
As patient checked out today, she told me she needed a cpe. She is already scheduled for 04/02/15 with labs on 03/26/15. She was under the impression it needs to be moved out until June. Can you clarify and I will call pt back?  # [phone]

## 2015-01-14 NOTE — Assessment & Plan Note (Signed)
Will increase the metformin to 2 in AM (since colitis is better) If A1c is still over 8%---will add low dose glimepiride (since she was overly sensitive to glipizide)

## 2015-01-14 NOTE — Progress Notes (Signed)
Subjective:    Patient ID: Shannon [name], female    DOB: 1956-02-12, 59 y.o.   MRN: [id number]  HPI Here for follow up of ongoing stomach and sugar problems Dr Vira Agar 2/2---- 1 adenomatous polyp removed. Still had inflammation of esophagus, stomach, pylorus Still having lots of belching and bloating  protonix twice a day Using gas pills and tums also Didn't do well with sucralfate  Sugars are running high Was low with even the half dose of glipizide but over 300 at times with only the metformin (even fasting) 195 this morning No hypoglycemic reactions now  Current Outpatient Prescriptions on File Prior to Visit  Medication Sig Dispense Refill  . bimatoprost (LUMIGAN) 0.03 % ophthalmic drops 1 drop at bedtime.      Marland Kitchen glucose blood (ONE TOUCH TEST STRIPS) test strip Check blood sugar once daily and as needed. 250.00 100 each 1  . metFORMIN (GLUCOPHAGE-XR) 500 MG 24 hr tablet Take 500 mg by mouth daily with breakfast.    . metoprolol succinate (TOPROL-XL) 25 MG 24 hr tablet Take 1 tablet by mouth  daily 90 tablet 0  . ondansetron (ZOFRAN) 4 MG tablet TAKE ONE TABLET BY MOUTH THREE TIMES DAILY AS NEEDED FOR NAUSEA AND VOMITING 60 tablet 0  . ONETOUCH DELICA LANCETS FINE MISC Check blood sugar once daily and as needed. 250.00 100 each 1  . pantoprazole (PROTONIX) 40 MG tablet Take 1 tablet by mouth  daily (Patient taking differently: Take 1 tablet by mouth  twice a day) 90 tablet 3  . traMADol (ULTRAM) 50 MG tablet TAKE ONE TABLET BY MOUTH TWICE DAILY AS NEEDED 60 tablet 0  . traZODone (DESYREL) 100 MG tablet Take 2 tablets by mouth  every night at bedtime 180 tablet 0  . triamterene-hydrochlorothiazide (MAXZIDE-25) 37.5-25 MG per tablet Take 1 tablet by mouth once a day 90 tablet 3  . valACYclovir (VALTREX) 1000 MG tablet      No current facility-administered medications on file prior to visit.    Allergies  Allergen Reactions  . Clarithromycin   . Lisinopril     REACTION:  cough  . Nitrofurantoin   . Oxycodone-Acetaminophen     REACTION: itching    Past Medical History  Diagnosis Date  . Asthma   . GERD (gastroesophageal reflux disease)   . Hypertension   . Osteopenia   . Mitral valve prolapse   . Glaucoma suspect   . Sleep disturbance   . Allergy   . Depression   . Shingles 11/2006  . Diabetes mellitus 11/12  . Hyperlipidemia     Past Surgical History  Procedure Laterality Date  . Abdominal hysterectomy  2000    /BSO - abn. Paps  . Appendectomy  1978  . Breast enhancement surgery  1987  . Bladder repair      x2  . Rectal prolapse repair      x1  . Nm myoview ltd  03/2005    EF 56%, no ischemia  . Shoulder surgery  09/2008, 01/2009    Right shoulder surgery, then redone with lysis of adhesions  . Cervical discectomy  11/2009    with synthetic discs inserted--  Dr Arnoldo Morale  . Cardiac catheterization      [facility]    Family History  Problem Relation Age of Onset  . Diabetes Mother   . Heart disease Mother   . Hypertension Mother   . Ovarian cancer Mother   . Melanoma Mother   . Cancer  Father   . Coronary artery disease Father   . Heart disease Father   . Diabetes Sister   . Depression Sister   . Coronary artery disease Sister   . Heart disease Sister   . Rheum arthritis Maternal Aunt   . Breast cancer Maternal Grandmother   . Diabetes Maternal Grandmother   . Diabetes Maternal Grandfather   . Colon cancer Neg Hx   . Heart attack Paternal Uncle   . Heart attack Cousin   . Heart attack Paternal Uncle     History   Social History  . Marital Status: Married    Spouse Name: N/A  . Number of Children: 5  . Years of Education: N/A   Occupational History  . homemaker    Social History Main Topics  . Smoking status: Never Smoker   . Smokeless tobacco: Never Used  . Alcohol Use: No  . Drug Use: No  . Sexual Activity: Not on file   Other Topics Concern  . Not on file   Social History Narrative   Home maker   Doesn't  exercise      [name] sister         Review of Systems  Eating okay in general Coffee burns her stomach bad Weight is stable     Objective:   Physical Exam  Constitutional: She appears well-developed and well-nourished. No distress.  Neck: Normal range of motion. Neck supple.  Pulmonary/Chest: Effort normal and breath sounds normal. No respiratory distress. She has no wheezes. She has no rales.  Abdominal: Soft. Bowel sounds are normal. She exhibits no distension. There is no rebound and no guarding.  Mild epigastric tenderness          Assessment & Plan:

## 2015-01-14 NOTE — Telephone Encounter (Signed)
Yes, I would like it pushed out about 4 months so that we have time to check her diabetes with the medicine changes I will be making

## 2015-01-15 LAB — HEMOGLOBIN A1C: HEMOGLOBIN A1C: 8.4 % — AB (ref 4.6–6.5)

## 2015-01-27 ENCOUNTER — Ambulatory Visit (INDEPENDENT_AMBULATORY_CARE_PROVIDER_SITE_OTHER): Payer: 59 | Admitting: Primary Care

## 2015-01-27 ENCOUNTER — Encounter: Payer: Self-pay | Admitting: Primary Care

## 2015-01-27 VITALS — BP 134/86 | HR 100 | Temp 98.5°F | Ht 64.0 in | Wt 166.8 lb

## 2015-01-27 DIAGNOSIS — J069 Acute upper respiratory infection, unspecified: Secondary | ICD-10-CM | POA: Diagnosis not present

## 2015-01-27 MED ORDER — BENZONATATE 100 MG PO CAPS: 100.0000 mg | ORAL_CAPSULE | Freq: Three times a day (TID) | ORAL | Status: DC | PRN

## 2015-01-27 MED ORDER — ALBUTEROL SULFATE HFA 108 (90 BASE) MCG/ACT IN AERS: 2.0000 | INHALATION_SPRAY | Freq: Four times a day (QID) | RESPIRATORY_TRACT | Status: DC | PRN

## 2015-01-27 MED ORDER — DOXYCYCLINE HYCLATE 100 MG PO TABS: 100.0000 mg | ORAL_TABLET | Freq: Two times a day (BID) | ORAL | Status: DC

## 2015-01-27 MED ORDER — FLUTICASONE PROPIONATE 50 MCG/ACT NA SUSP: 2.0000 | Freq: Every day | NASAL | Status: DC

## 2015-01-27 NOTE — Progress Notes (Signed)
Pre visit review using our clinic review tool, if applicable. No additional management support is needed unless otherwise documented below in the visit note. 

## 2015-01-27 NOTE — Progress Notes (Signed)
   Subjective:    Patient ID: Shannon [name], female    DOB: Apr 05, 1956, 59 y.o.   MRN: [id number]  HPI  Ms. Absher is a 59 year old female with a chief complaint of cough and left ear fullness. The cough began three days ago (Sunday) and is productive that started out as a thick whitish color but has now progressed to yellow-green. She had some wheezing last night with shortness of breath which resolved last night. She's taken tylenol and excedrin migraines for occipital headache and fevers which have helped to decrease both. She also has pressure to frontal sinuses with nasal congestion. Nothing has helped with the cough and nothing in particular makes the cough worse.  Review of Systems  Constitutional: Positive for fever and chills.  HENT: Positive for ear pain, sinus pressure and sore throat. Negative for rhinorrhea.   Eyes: Negative for itching.  Respiratory: Positive for cough, shortness of breath and wheezing.   Cardiovascular: Negative for chest pain.  Gastrointestinal: Negative for vomiting.  Musculoskeletal: Negative for myalgias.  Neurological: Positive for headaches. Negative for dizziness.  Hematological: Negative for adenopathy.       Objective:   Physical Exam  Constitutional: She is oriented to person, place, and time.  HENT:  Head: Normocephalic.  Right Ear: External ear normal.  Left Ear: External ear normal.  Eyes: Conjunctivae are normal. Pupils are equal, round, and reactive to light.  Neck: Neck supple.  Cardiovascular: Normal rate and regular rhythm.   Pulmonary/Chest: Effort normal and breath sounds normal. She has no wheezes. She has no rales.  Lymphadenopathy:    She has no cervical adenopathy.  Neurological: She is alert and oriented to person, place, and time.  Skin: Skin is warm and dry.  Psychiatric: She has a normal mood and affect.          Assessment & Plan:

## 2015-01-27 NOTE — Patient Instructions (Signed)
Use Albuterol Inhaler as needed for any returning wheezing or shortness of breath. Start Tessalon Pearls three times daily as needed for cough. Use Flonase daily for nasal congestion. Call if not better or symptoms worsen in 3-4 days.  Acute Bronchitis Bronchitis is inflammation of the airways that extend from the windpipe into the lungs (bronchi). The inflammation often causes mucus to develop. This leads to a cough, which is the most common symptom of bronchitis.  In acute bronchitis, the condition usually develops suddenly and goes away over time, usually in a couple weeks. Smoking, allergies, and asthma can make bronchitis worse. Repeated episodes of bronchitis may cause further lung problems.  CAUSES Acute bronchitis is most often caused by the same virus that causes a cold. The virus can spread from person to person (contagious) through coughing, sneezing, and touching contaminated objects. SIGNS AND SYMPTOMS   Cough.   Fever.   Coughing up mucus.   Body aches.   Chest congestion.   Chills.   Shortness of breath.   Sore throat.  DIAGNOSIS  Acute bronchitis is usually diagnosed through a physical exam. Your health care provider will also ask you questions about your medical history. Tests, such as chest X-rays, are sometimes done to rule out other conditions.  TREATMENT  Acute bronchitis usually goes away in a couple weeks. Oftentimes, no medical treatment is necessary. Medicines are sometimes given for relief of fever or cough. Antibiotic medicines are usually not needed but may be prescribed in certain situations. In some cases, an inhaler may be recommended to help reduce shortness of breath and control the cough. A cool mist vaporizer may also be used to help thin bronchial secretions and make it easier to clear the chest.  HOME CARE INSTRUCTIONS  Get plenty of rest.   Drink enough fluids to keep your urine clear or pale yellow (unless you have a medical condition  that requires fluid restriction). Increasing fluids may help thin your respiratory secretions (sputum) and reduce chest congestion, and it will prevent dehydration.   Take medicines only as directed by your health care provider.  If you were prescribed an antibiotic medicine, finish it all even if you start to feel better.  Avoid smoking and secondhand smoke. Exposure to cigarette smoke or irritating chemicals will make bronchitis worse. If you are a smoker, consider using nicotine gum or skin patches to help control withdrawal symptoms. Quitting smoking will help your lungs heal faster.   Reduce the chances of another bout of acute bronchitis by washing your hands frequently, avoiding people with cold symptoms, and trying not to touch your hands to your mouth, nose, or eyes.   Keep all follow-up visits as directed by your health care provider.  SEEK MEDICAL CARE IF: Your symptoms do not improve after 1 week of treatment.  SEEK IMMEDIATE MEDICAL CARE IF:  You develop an increased fever or chills.   You have chest pain.   You have severe shortness of breath.  You have bloody sputum.   You develop dehydration.  You faint or repeatedly feel like you are going to pass out.  You develop repeated vomiting.  You develop a severe headache. MAKE SURE YOU:   Understand these instructions.  Will watch your condition.  Will get help right away if you are not doing well or get worse. Document Released: 12/14/2004 Document Revised: 03/23/2014 Document Reviewed: 04/29/2013 [facility] Patient Information 2015 [place], [place]. This information is not intended to replace advice given to you by your  health care provider. Make sure you discuss any questions you have with your health care provider.

## 2015-01-27 NOTE — Assessment & Plan Note (Signed)
Suspect a viral URI due to examination and duration of symptoms.  Patient upset that an antibiotic was not initially prescribed, I explained to her that this was likely viral, but she insisted so an RX for doxycycline was provided to cover for any potential bacterial involvement. Flonase for nasal congestion. Tessalon Pearls for cough. Follow up as needed.

## 2015-02-01 ENCOUNTER — Other Ambulatory Visit: Payer: Self-pay | Admitting: Internal Medicine

## 2015-02-01 ENCOUNTER — Ambulatory Visit (INDEPENDENT_AMBULATORY_CARE_PROVIDER_SITE_OTHER)
Admission: RE | Admit: 2015-02-01 | Discharge: 2015-02-01 | Disposition: A | Discharge status: Home / Self Care | Payer: 59 | Source: Ambulatory Visit | Attending: Primary Care | Admitting: Primary Care

## 2015-02-01 ENCOUNTER — Telehealth: Payer: Self-pay

## 2015-02-01 DIAGNOSIS — R042 Hemoptysis: Secondary | ICD-10-CM

## 2015-02-01 NOTE — Telephone Encounter (Signed)
Called patient and notified her to come in to get a 2 view chest x-ray done.

## 2015-02-01 NOTE — Telephone Encounter (Signed)
Patient will try to come this afternoon on 02/01/15.

## 2015-02-01 NOTE — Telephone Encounter (Signed)
Pt was seen 01/27/15 and pt was coughing up small amt of blood on 01/30/15; today pt cough up small amt of brownish red color; blood tinged mucus; some blood the size of eraser on pencil. Pt having SOB same as when seen on 01/27/15; pt gets winded quickly.  Pt wants to get CXR; thinks something going on on lt side of chest. No CP or fever. Pt continuing to used prescribed meds. [facility].

## 2015-02-01 NOTE — Telephone Encounter (Signed)
Anda Kraft called in to place a verbal order for patient to have a chest x-ray 2 view.

## 2015-02-01 NOTE — Telephone Encounter (Signed)
Allie Bossier NP said she would review and her CMA would contact pt. Pt voiced understanding and if condition changes or worsens prior to cb pt will call [facility].

## 2015-02-02 ENCOUNTER — Telehealth: Payer: Self-pay | Admitting: Primary Care

## 2015-02-02 DIAGNOSIS — J069 Acute upper respiratory infection, unspecified: Secondary | ICD-10-CM

## 2015-02-02 MED ORDER — HYDROCODONE-HOMATROPINE 5-1.5 MG/5ML PO SYRP: 5.0000 mL | ORAL_SOLUTION | Freq: Three times a day (TID) | ORAL | Status: DC | PRN

## 2015-02-02 NOTE — Telephone Encounter (Signed)
RX printed for [employer]. Patient to pick up at our office.

## 2015-02-02 NOTE — Addendum Note (Signed)
Addended by: Pleas Koch on: 02/02/2015 03:51 PM   Modules accepted: Orders, Medications

## 2015-02-02 NOTE — Telephone Encounter (Signed)
[facility] [place]. Pharmacist stated that it have to be a printed copy for patient or e-scribed the rx since it is in class II.

## 2015-02-02 NOTE — Telephone Encounter (Signed)
Spoke with Shannon [name] this morning, and discussed xray results being negative. The tessalon pearls are not helping with cough, and will prescribe Hycodan. She is able to tolerate Codeine and has had this in the past. [facility] [place]. She verbalized understanding to call me if no improvement with cough. She is not feeling any worse.

## 2015-02-02 NOTE — Telephone Encounter (Signed)
Will you call this into the [facility] [place]. Hycodan 162ml total. 4ml every 8 hours PRN cough. 0 Refils. She notified me this morning that she's had no problem with taking codeine in the past, despite her allergy to Percocet.

## 2015-02-02 NOTE — Telephone Encounter (Signed)
Pt left v/m requesting printed rx for Hycodan/ pt request cb ASAP. Hycodan cannot be called to pharmacy.

## 2015-02-02 NOTE — Telephone Encounter (Signed)
Called patient and notified rx is ready for pick up, left in front office.

## 2015-03-26 ENCOUNTER — Other Ambulatory Visit: Payer: 59

## 2015-03-29 ENCOUNTER — Other Ambulatory Visit: Payer: Self-pay | Admitting: Internal Medicine

## 2015-03-29 NOTE — Telephone Encounter (Signed)
12/14/14 for both

## 2015-03-30 NOTE — Telephone Encounter (Signed)
rx called into pharmacy

## 2015-03-30 NOTE — Telephone Encounter (Signed)
Approved: #[phone] x [phone] for both 

## 2015-04-02 ENCOUNTER — Encounter: Payer: 59 | Admitting: Internal Medicine

## 2015-04-23 ENCOUNTER — Ambulatory Visit (INDEPENDENT_AMBULATORY_CARE_PROVIDER_SITE_OTHER): Payer: 59 | Admitting: Internal Medicine

## 2015-04-23 ENCOUNTER — Encounter: Payer: Self-pay | Admitting: Internal Medicine

## 2015-04-23 VITALS — BP 118/80 | HR 88 | Temp 98.0°F | Wt 159.0 lb

## 2015-04-23 DIAGNOSIS — E1165 Type 2 diabetes mellitus with hyperglycemia: Secondary | ICD-10-CM

## 2015-04-23 DIAGNOSIS — IMO0001 Reserved for inherently not codable concepts without codable children: Secondary | ICD-10-CM

## 2015-04-23 DIAGNOSIS — K209 Esophagitis, unspecified without bleeding: Secondary | ICD-10-CM

## 2015-04-23 DIAGNOSIS — J0101 Acute recurrent maxillary sinusitis: Secondary | ICD-10-CM

## 2015-04-23 LAB — CBC WITH DIFFERENTIAL/PLATELET
Basophils Absolute: 0 10*3/uL (ref 0.0–0.1)
Basophils Relative: 0.3 % (ref 0.0–3.0)
EOS ABS: 0.1 10*3/uL (ref 0.0–0.7)
Eosinophils Relative: 4.1 % (ref 0.0–5.0)
HCT: 41.2 % (ref 36.0–46.0)
HEMOGLOBIN: 13.8 g/dL (ref 12.0–15.0)
LYMPHS PCT: 42.2 % (ref 12.0–46.0)
Lymphs Abs: 1.5 10*3/uL (ref 0.7–4.0)
MCHC: 33.6 g/dL (ref 30.0–36.0)
MCV: 85.3 fl (ref 78.0–100.0)
MONOS PCT: 13.2 % — AB (ref 3.0–12.0)
Monocytes Absolute: 0.5 10*3/uL (ref 0.1–1.0)
NEUTROS PCT: 40.2 % — AB (ref 43.0–77.0)
Neutro Abs: 1.4 10*3/uL (ref 1.4–7.7)
Platelets: 222 10*3/uL (ref 150.0–400.0)
RBC: 4.84 Mil/uL (ref 3.87–5.11)
RDW: 13.7 % (ref 11.5–15.5)
WBC: 3.6 10*3/uL — ABNORMAL LOW (ref 4.0–10.5)

## 2015-04-23 LAB — LIPID PANEL
CHOLESTEROL: 206 mg/dL — AB (ref 0–200)
HDL: 38.3 mg/dL — AB (ref >39.00)
NONHDL: 167.7
Total CHOL/HDL Ratio: 5
Triglycerides: 254 mg/dL — ABNORMAL HIGH (ref 0.0–149.0)
VLDL: 50.8 mg/dL — ABNORMAL HIGH (ref 0.0–40.0)

## 2015-04-23 LAB — HEMOGLOBIN A1C: Hgb A1c MFr Bld: 7.2 % — ABNORMAL HIGH (ref 4.6–6.5)

## 2015-04-23 LAB — COMPREHENSIVE METABOLIC PANEL
ALK PHOS: 85 U/L (ref 39–117)
ALT: 41 U/L — AB (ref 0–35)
AST: 25 U/L (ref 0–37)
Albumin: 4.3 g/dL (ref 3.5–5.2)
BUN: 16 mg/dL (ref 6–23)
CALCIUM: 9.4 mg/dL (ref 8.4–10.5)
CO2: 28 meq/L (ref 19–32)
Chloride: 98 mEq/L (ref 96–112)
Creatinine, Ser: 0.98 mg/dL (ref 0.40–1.20)
GFR: 62.13 mL/min (ref >60.00)
GLUCOSE: 161 mg/dL — AB (ref 70–99)
Potassium: 4 mEq/L (ref 3.5–5.1)
Sodium: 136 mEq/L (ref 135–145)
Total Bilirubin: 0.3 mg/dL (ref 0.2–1.2)
Total Protein: 7.4 g/dL (ref 6.0–8.3)

## 2015-04-23 LAB — MICROALBUMIN / CREATININE URINE RATIO
Creatinine,U: 165.2 mg/dL
MICROALB UR: 2.3 mg/dL — AB (ref 0.0–1.9)
Microalb Creat Ratio: 1.4 mg/g (ref 0.0–30.0)

## 2015-04-23 LAB — LDL CHOLESTEROL, DIRECT: Direct LDL: 132 mg/dL

## 2015-04-23 LAB — T4, FREE: Free T4: 0.93 ng/dL (ref 0.60–1.60)

## 2015-04-23 MED ORDER — AMOXICILLIN-POT CLAVULANATE 875-125 MG PO TABS: 1.0000 | ORAL_TABLET | Freq: Two times a day (BID) | ORAL | Status: DC

## 2015-04-23 MED ORDER — SUCRALFATE 1 G PO TABS: 1.0000 g | ORAL_TABLET | Freq: Three times a day (TID) | ORAL | Status: DC

## 2015-04-23 NOTE — Progress Notes (Signed)
Subjective:    Patient ID: Shannon [name], female    DOB: 1956/10/07, 59 y.o.   MRN: [id number]  HPI Here due to "the worst" infection Pain in eyes--even to move them Cough has "noise" Sick for a week Seen at urgent care 4 days ago with 102 temp--put on amoxil (some improvement in headache but still with frontal pressure and eye pain) Some thick bright yellow nasal discharge--gets dizzy blowing this out Fever is gone Not SOB Some sweats at night lately  Wasn't feeling well even before she got sick Stomach acting up Vomited the omeprazole/bicarb---couldn't tolerate this Back on protonix bid Ongoing symptoms for years carafate didn't help  Sugars are better Just on the 2 metformins in AM Sugars 140's generally---occasional low ones but no severe reactions (but still gets some nausea and headache if goes down a little)  Current Outpatient Prescriptions on File Prior to Visit  Medication Sig Dispense Refill  . albuterol (PROAIR HFA) 108 (90 BASE) MCG/ACT inhaler Inhale 2 puffs into the lungs every 6 (six) hours as needed for wheezing or shortness of breath. 1 Inhaler 0  . benzonatate (TESSALON PERLES) 100 MG capsule Take 1 capsule (100 mg total) by mouth 3 (three) times daily as needed for cough. 20 capsule 0  . bimatoprost (LUMIGAN) 0.03 % ophthalmic drops 1 drop at bedtime.      . fluticasone (FLONASE) 50 MCG/ACT nasal spray Place 2 sprays into both nostrils daily. 16 g 6  . metFORMIN (GLUCOPHAGE-XR) 500 MG 24 hr tablet Take 2 tablets (1,000 mg total) by mouth daily with breakfast. 180 tablet 3  . metoprolol succinate (TOPROL-XL) 25 MG 24 hr tablet Take 1 tablet by mouth  daily 90 tablet 0  . ondansetron (ZOFRAN) 4 MG tablet TAKE ONE TABLET BY MOUTH THREE TIMES DAILY AS NEEDED FOR NAUSEA AND VOMITING 60 tablet 0  . ONE TOUCH ULTRA TEST test strip USE ONE STRIP TO CHECK GLUCOSE ONCE DAILY AND AS NEEDED 100 each 3  . ONETOUCH DELICA LANCETS FINE MISC Check blood sugar once daily  and as needed. 250.00 100 each 1  . pantoprazole (PROTONIX) 40 MG tablet Take 1 tablet by mouth  daily (Patient taking differently: Take 1 tablet by twice  daily) 90 tablet 3  . traMADol (ULTRAM) 50 MG tablet TAKE ONE TABLET BY MOUTH TWICE DAILY AS NEEDED 60 tablet 0  . traZODone (DESYREL) 100 MG tablet Take 2 tablets by mouth  every night at bedtime 180 tablet 3  . triamterene-hydrochlorothiazide (MAXZIDE-25) 37.5-25 MG per tablet Take 1 tablet by mouth once a day 90 tablet 3  . valACYclovir (VALTREX) 1000 MG tablet      No current facility-administered medications on file prior to visit.    Allergies  Allergen Reactions  . Clarithromycin   . Lisinopril     REACTION: cough  . Nitrofurantoin   . Oxycodone-Acetaminophen     REACTION: itching    Past Medical History  Diagnosis Date  . Asthma   . GERD (gastroesophageal reflux disease)   . Hypertension   . Osteopenia   . Mitral valve prolapse   . Glaucoma suspect   . Sleep disturbance   . Allergy   . Depression   . Shingles 11/2006  . Diabetes mellitus 11/12  . Hyperlipidemia     Past Surgical History  Procedure Laterality Date  . Abdominal hysterectomy  2000    /BSO - abn. Paps  . Appendectomy  1978  . Breast enhancement surgery  1987  . Bladder repair      x2  . Rectal prolapse repair      x1  . Nm myoview ltd  03/2005    EF 56%, no ischemia  . Shoulder surgery  09/2008, 01/2009    Right shoulder surgery, then redone with lysis of adhesions  . Cervical discectomy  11/2009    with synthetic discs inserted--  Dr Arnoldo Morale  . Cardiac catheterization      [facility]    Family History  Problem Relation Age of Onset  . Diabetes Mother   . Heart disease Mother   . Hypertension Mother   . Ovarian cancer Mother   . Melanoma Mother   . Cancer Father   . Coronary artery disease Father   . Heart disease Father   . Diabetes Sister   . Depression Sister   . Coronary artery disease Sister   . Heart disease Sister   . Rheum  arthritis Maternal Aunt   . Breast cancer Maternal Grandmother   . Diabetes Maternal Grandmother   . Diabetes Maternal Grandfather   . Colon cancer Neg Hx   . Heart attack Paternal Uncle   . Heart attack Cousin   . Heart attack Paternal Uncle     History   Social History  . Marital Status: Married    Spouse Name: N/A  . Number of Children: 5  . Years of Education: N/A   Occupational History  . homemaker    Social History Main Topics  . Smoking status: Never Smoker   . Smokeless tobacco: Never Used  . Alcohol Use: No  . Drug Use: No  . Sexual Activity: Not on file   Other Topics Concern  . Not on file   Social History Narrative   Home maker   Doesn't exercise      [name] sister          Review of Systems Appetite is off Has lost 7# Sleeps okay    Objective:   Physical Exam  Constitutional: She appears well-developed and well-nourished. No distress.  HENT:  Mouth/Throat: Oropharynx is clear and moist. No oropharyngeal exudate.  Moderate maxillary tenderness and some left frontal tenderness Moderate nasal inflammation TMs normal  Neck: Normal range of motion. Neck supple. No thyromegaly present.  Pulmonary/Chest: Effort normal and breath sounds normal. No respiratory distress. She has no wheezes. She has no rales.  Abdominal: Soft. Bowel sounds are normal. She exhibits no distension. There is no rebound and no guarding.  Epigastric and left sided tenderness  Lymphadenopathy:    She has no cervical adenopathy.          Assessment & Plan:

## 2015-04-23 NOTE — Assessment & Plan Note (Signed)
Severe symptoms including sig fever---some better with amoxil but not resolved Will change to augmentin

## 2015-04-23 NOTE — Assessment & Plan Note (Signed)
Recurrent severe pain--really affecting her life PPI not effective Will try adding carafate--if this doesn't work, will consider metoclopramide and ?premeal mylanta?

## 2015-04-23 NOTE — Assessment & Plan Note (Signed)
Tolerating the higher metformin without colitis Sugars are better Will check labs

## 2015-05-04 ENCOUNTER — Telehealth: Payer: Self-pay | Admitting: *Deleted

## 2015-05-04 NOTE — Telephone Encounter (Signed)
Pt said she is going to see her PCP Monday and will let us know if she needs a fu

## 2015-05-04 NOTE — Telephone Encounter (Signed)
Lmom to call our office. Time to schedule a echocardiogram (1 yr f/u).

## 2015-05-07 ENCOUNTER — Other Ambulatory Visit: Payer: 59

## 2015-05-10 ENCOUNTER — Ambulatory Visit (INDEPENDENT_AMBULATORY_CARE_PROVIDER_SITE_OTHER): Payer: 59 | Admitting: Internal Medicine

## 2015-05-10 ENCOUNTER — Encounter: Payer: Self-pay | Admitting: Internal Medicine

## 2015-05-10 VITALS — BP 110/70 | HR 95 | Temp 98.1°F | Ht 64.0 in | Wt 160.0 lb

## 2015-05-10 DIAGNOSIS — E785 Hyperlipidemia, unspecified: Secondary | ICD-10-CM

## 2015-05-10 DIAGNOSIS — Z23 Encounter for immunization: Secondary | ICD-10-CM | POA: Diagnosis not present

## 2015-05-10 DIAGNOSIS — Z0000 Encounter for general adult medical examination without abnormal findings: Secondary | ICD-10-CM

## 2015-05-10 DIAGNOSIS — I10 Essential (primary) hypertension: Secondary | ICD-10-CM | POA: Diagnosis not present

## 2015-05-10 DIAGNOSIS — E119 Type 2 diabetes mellitus without complications: Secondary | ICD-10-CM

## 2015-05-10 DIAGNOSIS — R1013 Epigastric pain: Secondary | ICD-10-CM

## 2015-05-10 LAB — HM DIABETES FOOT EXAM

## 2015-05-10 NOTE — Assessment & Plan Note (Signed)
No statin for now due to concern it caused colitis and may be involved with ongoing GI problems If stable, can reconsider

## 2015-05-10 NOTE — Addendum Note (Signed)
Addended by: Despina Hidden on: 05/10/2015 12:10 PM   Modules accepted: Orders

## 2015-05-10 NOTE — Assessment & Plan Note (Signed)
Lab Results  Component Value Date   HGBA1C 7.2* 04/23/2015   Good control on metformin

## 2015-05-10 NOTE — Progress Notes (Signed)
Pre visit review using our clinic review tool, if applicable. No additional management support is needed unless otherwise documented below in the visit note. 

## 2015-05-10 NOTE — Progress Notes (Signed)
Subjective:    Patient ID: Shannon [name], female    DOB: Jun 24, 1956, 59 y.o.   MRN: [id number]  HPI Here for physical  Sinuses are better  May be some better with the carafate  Has had 3 really bad days since starting---terrible bloating and had to pace around. Takes gas pills Swallows okay Appetite is better in past week  Diabetes control is good Will get some low sugar reactions if she forgets to eat---cold sweat, headache, etc Some pain in left 4th toe--numbness also. Hard to walk on it--then goes away  Still off statin--unclear if it was related to stomach issues or colitis  Current Outpatient Prescriptions on File Prior to Visit  Medication Sig Dispense Refill  . albuterol (PROAIR HFA) 108 (90 BASE) MCG/ACT inhaler Inhale 2 puffs into the lungs every 6 (six) hours as needed for wheezing or shortness of breath. 1 Inhaler 0  . fluticasone (FLONASE) 50 MCG/ACT nasal spray Place 2 sprays into both nostrils daily. 16 g 6  . metFORMIN (GLUCOPHAGE-XR) 500 MG 24 hr tablet Take 2 tablets (1,000 mg total) by mouth daily with breakfast. 180 tablet 3  . metoprolol succinate (TOPROL-XL) 25 MG 24 hr tablet Take 1 tablet by mouth  daily 90 tablet 0  . ondansetron (ZOFRAN) 4 MG tablet TAKE ONE TABLET BY MOUTH THREE TIMES DAILY AS NEEDED FOR NAUSEA AND VOMITING 60 tablet 0  . ONE TOUCH ULTRA TEST test strip USE ONE STRIP TO CHECK GLUCOSE ONCE DAILY AND AS NEEDED 100 each 3  . ONETOUCH DELICA LANCETS FINE MISC Check blood sugar once daily and as needed. 250.00 100 each 1  . pantoprazole (PROTONIX) 40 MG tablet Take 1 tablet by mouth  daily (Patient taking differently: Take 1 tablet by twice  daily) 90 tablet 3  . sucralfate (CARAFATE) 1 G tablet Take 1 tablet (1 g total) by mouth 3 (three) times daily before meals. 90 tablet 11  . traMADol (ULTRAM) 50 MG tablet TAKE ONE TABLET BY MOUTH TWICE DAILY AS NEEDED 60 tablet 0  . traZODone (DESYREL) 100 MG tablet Take 2 tablets by mouth  every night  at bedtime 180 tablet 3  . triamterene-hydrochlorothiazide (MAXZIDE-25) 37.5-25 MG per tablet Take 1 tablet by mouth once a day 90 tablet 3  . valACYclovir (VALTREX) 1000 MG tablet      No current facility-administered medications on file prior to visit.    Allergies  Allergen Reactions  . Clarithromycin   . Lisinopril     REACTION: cough  . Nitrofurantoin   . Oxycodone-Acetaminophen     REACTION: itching    Past Medical History  Diagnosis Date  . Asthma   . GERD (gastroesophageal reflux disease)   . Hypertension   . Osteopenia   . Mitral valve prolapse   . Glaucoma suspect   . Sleep disturbance   . Allergy   . Depression   . Shingles 11/2006  . Diabetes mellitus 11/12  . Hyperlipidemia     Past Surgical History  Procedure Laterality Date  . Abdominal hysterectomy  2000    /BSO - abn. Paps  . Appendectomy  1978  . Breast enhancement surgery  1987  . Bladder repair      x2  . Rectal prolapse repair      x1  . Nm myoview ltd  03/2005    EF 56%, no ischemia  . Shoulder surgery  09/2008, 01/2009    Right shoulder surgery, then redone with lysis of adhesions  .  Cervical discectomy  11/2009    with synthetic discs inserted--  Dr Arnoldo Morale  . Cardiac catheterization      [facility]    Family History  Problem Relation Age of Onset  . Diabetes Mother   . Heart disease Mother   . Hypertension Mother   . Ovarian cancer Mother   . Melanoma Mother   . Cancer Father   . Coronary artery disease Father   . Heart disease Father   . Diabetes Sister   . Depression Sister   . Coronary artery disease Sister   . Heart disease Sister   . Rheum arthritis Maternal Aunt   . Breast cancer Maternal Grandmother   . Diabetes Maternal Grandmother   . Diabetes Maternal Grandfather   . Colon cancer Neg Hx   . Heart attack Paternal Uncle   . Heart attack Cousin   . Heart attack Paternal Uncle     History   Social History  . Marital Status: Married    Spouse Name: N/A  . Number  of Children: 5  . Years of Education: N/A   Occupational History  . homemaker    Social History Main Topics  . Smoking status: Never Smoker   . Smokeless tobacco: Never Used  . Alcohol Use: No  . Drug Use: No  . Sexual Activity: Not on file   Other Topics Concern  . Not on file   Social History Narrative   Home maker   Doesn't exercise      Shannon [name] sister         Review of Systems  Constitutional: Negative for fatigue and unexpected weight change.       Weight has stabilized Wears seat belt  HENT: Negative for dental problem, hearing loss, tinnitus and trouble swallowing.        Keeps up with dentist  Eyes: Negative for visual disturbance.       No diplopia or unilateral vision loss  Respiratory: Negative for cough, chest tightness and shortness of breath.   Cardiovascular: Positive for palpitations. Negative for chest pain and leg swelling.       Palpitations in past month---at rest and with activity at times  Gastrointestinal: Positive for abdominal pain. Negative for vomiting, constipation and blood in stool.  Endocrine: Positive for polydipsia. Negative for polyuria.       Eye drops make mouth dry  Genitourinary: Positive for dyspareunia. Negative for hematuria.       Had brief "pulling" sensation when voiding---went away on its own No sex due to dyspareunia  Musculoskeletal: Positive for arthralgias. Negative for back pain and joint swelling.       Uses tramadol prn  Skin: Negative for rash.       No suspicious lesions  Allergic/Immunologic: Positive for environmental allergies. Negative for immunocompromised state.  Neurological: Positive for headaches. Negative for dizziness, syncope, weakness and light-headedness.  Hematological: Negative for adenopathy. Does not bruise/bleed easily.  Psychiatric/Behavioral: Negative for sleep disturbance and dysphoric mood. The patient is not nervous/anxious.        Objective:   Physical Exam  Constitutional: She  appears well-developed and well-nourished. No distress.  HENT:  Head: Normocephalic and atraumatic.  Right Ear: External ear normal.  Left Ear: External ear normal.  Mouth/Throat: Oropharynx is clear and moist. No oropharyngeal exudate.  Eyes: Conjunctivae and EOM are normal. Pupils are equal, round, and reactive to light.  Neck: Normal range of motion. Neck supple. No thyromegaly present.  Cardiovascular: Normal rate, regular  rhythm, normal heart sounds and intact distal pulses.  Exam reveals no gallop.   No murmur heard. Pulmonary/Chest: Effort normal and breath sounds normal. No respiratory distress. She has no wheezes. She has no rales.  Abdominal: Soft. There is no tenderness.  Musculoskeletal: She exhibits no edema or tenderness.  Lymphadenopathy:    She has no cervical adenopathy.  Neurological:  Normal sensation in plantar feet (discussed padding for left 4th toe--no findings)  Skin: No rash noted. No erythema.  Very slight callous on great toes No ulcers or sig lesions  Psychiatric: She has a normal mood and affect. Her behavior is normal.          Assessment & Plan:

## 2015-05-10 NOTE — Assessment & Plan Note (Signed)
BP Readings from Last 3 Encounters:  05/10/15 110/70  04/23/15 118/80  01/27/15 134/86   Good control

## 2015-05-10 NOTE — Assessment & Plan Note (Signed)
Generally healthy Tdap today UTD with colon Mammogram due 3/17---- no breast exam after discussion

## 2015-05-10 NOTE — Assessment & Plan Note (Signed)
?  some better with sucralfate Consider reglan if not satisfied

## 2015-05-11 ENCOUNTER — Other Ambulatory Visit: Payer: Self-pay | Admitting: *Deleted

## 2015-05-11 MED ORDER — VALACYCLOVIR HCL 1 G PO TABS: 2000.0000 mg | ORAL_TABLET | Freq: Every day | ORAL | Status: DC | PRN

## 2015-05-22 ENCOUNTER — Other Ambulatory Visit: Payer: Self-pay | Admitting: Internal Medicine

## 2015-06-08 ENCOUNTER — Other Ambulatory Visit: Payer: Self-pay | Admitting: Internal Medicine

## 2015-08-06 ENCOUNTER — Telehealth: Payer: Self-pay | Admitting: Internal Medicine

## 2015-08-06 NOTE — Telephone Encounter (Signed)
[facility]  Patient Name: Shannon [name]  DOB: [birth year]    Initial Comment Caller states she is having congestion and a cough. Her BS is 383 with her diabetic medication.    Nurse Assessment  Nurse: Wynetta Emery, RN, Baker Janus Date/Time Eilene Ghazi Time): 08/06/2015 1:51:54 PM  Confirm and document reason for call. If symptomatic, describe symptoms. ---Shannon [name] is c/o of cough and congestion in head and chest. her blood sugar's are elevated and this am was 383. after medications.  Has the patient traveled out of the country within the last 30 days? ---No  Does the patient require triage? ---Yes  Related visit to physician within the last 2 weeks? ---No  Does the PT have any chronic conditions? (i.e. diabetes, asthma, etc.) ---Yes  List chronic conditions. ---Diabetes     Guidelines    Guideline Title Affirmed Question Affirmed Notes       Final Disposition User        Comments  NOTE: patient could not get an appointment today with Dr. Silvio Pate; Nurse made an appointment for Monday to See Dr.Letvak and gave care advise to bring sugars down. Nurse will call the office to see if they might want to give her another metformin at night to help bring her blood sugars down-- sent to Rena's voicemail left message for her. APPOINTMENT is 08-09-2015 245pm Monday W/ Dr. Silvio Pate

## 2015-08-06 NOTE — Telephone Encounter (Signed)
Patient notified and verbalized understanding. No OTC meds to raise sugar.

## 2015-08-06 NOTE — Telephone Encounter (Signed)
Ok to take extra metformin 500mg  in evening.  Make sure she is not taking cough syrup OTC or otherwise with sugar as that would raise her sugar levels. Increase water, avoid carbs over weekend.  Lab Results  Component Value Date   CREATININE 0.98 04/23/2015    Lab Results  Component Value Date   HGBA1C 7.2* 04/23/2015

## 2015-08-06 NOTE — Telephone Encounter (Signed)
Shannon [name] nurse with [facility] [employer] left v/m; Shannon [name] had spoken with pt about elevated BS and [facility] disposition was to be seen in 4 hours.pts blood sugars have been running 200's-300's and this AM 1 1/2 hr after eating breakfast BS was 383; pt not having diabetic symptoms; pt feels OK. Shannon [name] gave home care instructions because pt only wants to see Dr Silvio Pate; Shannon [name] gave pt appt with Dr Silvio Pate on 08/09/15 .at 2:45 pm but until then Shannon [name] wants to know if pt could have increase in Metformin by taking tablet in PM. Pt is presently taking Metformin 500 mg 24 hr tab taking 2 tabs daily with breakfast. On 05/10/15 annual exam was noted in chart diabetes under good control with Metformin. Dr Silvio Pate out of office and will send note to Dr Danise Mina.

## 2015-08-09 ENCOUNTER — Ambulatory Visit (INDEPENDENT_AMBULATORY_CARE_PROVIDER_SITE_OTHER): Payer: 59 | Admitting: Internal Medicine

## 2015-08-09 ENCOUNTER — Encounter: Payer: Self-pay | Admitting: Internal Medicine

## 2015-08-09 VITALS — BP 110/70 | HR 96 | Temp 97.8°F | Wt 158.0 lb

## 2015-08-09 DIAGNOSIS — Z23 Encounter for immunization: Secondary | ICD-10-CM | POA: Diagnosis not present

## 2015-08-09 DIAGNOSIS — R739 Hyperglycemia, unspecified: Secondary | ICD-10-CM | POA: Insufficient documentation

## 2015-08-09 DIAGNOSIS — K529 Noninfective gastroenteritis and colitis, unspecified: Secondary | ICD-10-CM

## 2015-08-09 NOTE — Assessment & Plan Note (Signed)
Having one of her intermittent flares Really seems like colonic pain--not sure if she needs trial of mesalamine or cortisone enema---will send note to Dr Vira Agar and she will call him also May limit the metformin (though not clear this caused the diarrhea and has tolerated it at times)

## 2015-08-09 NOTE — Addendum Note (Signed)
Addended by: Despina Hidden on: 08/09/2015 05:11 PM   Modules accepted: Orders

## 2015-08-09 NOTE — Progress Notes (Signed)
Pre visit review using our clinic review tool, if applicable. No additional management support is needed unless otherwise documented below in the visit note. 

## 2015-08-09 NOTE — Assessment & Plan Note (Signed)
Last A1c okay Has had sinus infection that is now better on amoxicillin May need to consider trying evening metformin if stays over 150

## 2015-08-09 NOTE — Progress Notes (Signed)
Subjective:    Patient ID: Shannon [name], female    DOB: [birth year], 59 y.o.   MRN: [id number]  HPI Has been sick for several days Feeling some better now Had been coughing up chunks of green sputum  Had called in with some sugars over 400 Had been in the 200's --thought it might be due to the infection--but then it was over 400 (this was 3-4 days ago) Fasting 160 this AM Just taking the 2 metformin daily  Started the amoxicillin Shannon [name] had left over from her last infection Feels better now No OTC meds  Current Outpatient Prescriptions on File Prior to Visit  Medication Sig Dispense Refill  . albuterol (PROAIR HFA) 108 (90 BASE) MCG/ACT inhaler Inhale 2 puffs into the lungs every 6 (six) hours as needed for wheezing or shortness of breath. 1 Inhaler 0  . brimonidine (ALPHAGAN) 0.2 % ophthalmic solution     . fluticasone (FLONASE) 50 MCG/ACT nasal spray Place 2 sprays into both nostrils daily. 16 g 6  . metFORMIN (GLUCOPHAGE-XR) 500 MG 24 hr tablet Take 2 tablets (1,000 mg total) by mouth daily with breakfast. 180 tablet 3  . metoprolol succinate (TOPROL-XL) 25 MG 24 hr tablet Take 1 tablet by mouth  daily 90 tablet 3  . ondansetron (ZOFRAN) 4 MG tablet TAKE ONE TABLET BY MOUTH THREE TIMES DAILY AS NEEDED FOR NAUSEA AND VOMITING 60 tablet 0  . ONE TOUCH ULTRA TEST test strip USE ONE STRIP TO CHECK GLUCOSE ONCE DAILY AND AS NEEDED 100 each 3  . ONETOUCH DELICA LANCETS FINE MISC Check blood sugar once daily and as needed. 250.00 100 each 1  . pantoprazole (PROTONIX) 40 MG tablet Take 1 tablet by mouth  daily (Patient taking differently: Take 1 tablet by twice  daily) 90 tablet 3  . sucralfate (CARAFATE) 1 G tablet Take 1 tablet (1 g total) by mouth 3 (three) times daily before meals. 90 tablet 11  . traMADol (ULTRAM) 50 MG tablet TAKE ONE TABLET BY MOUTH TWICE DAILY AS NEEDED 60 tablet 0  . traZODone (DESYREL) 100 MG tablet Take 2 tablets by mouth  every night at bedtime 180 tablet 3    . triamterene-hydrochlorothiazide (MAXZIDE-25) 37.5-25 MG per tablet Take 1 tablet by mouth once a day 90 tablet 3  . valACYclovir (VALTREX) 1000 MG tablet Take 2 tablets (2,000 mg total) by mouth daily as needed (at the onset of symptoms and take 2 tablets 12 hours later). 90 tablet 0   No current facility-administered medications on file prior to visit.    Allergies  Allergen Reactions  . Clarithromycin   . Lisinopril     REACTION: cough  . Nitrofurantoin   . Oxycodone-Acetaminophen     REACTION: itching    Past Medical History  Diagnosis Date  . Asthma   . GERD (gastroesophageal reflux disease)   . Hypertension   . Osteopenia   . Mitral valve prolapse   . Glaucoma suspect   . Sleep disturbance   . Allergy   . Depression   . Shingles 11/2006  . Diabetes mellitus 11/12  . Hyperlipidemia     Past Surgical History  Procedure Laterality Date  . Abdominal hysterectomy  2000    /BSO - abn. Paps  . Appendectomy  1978  . Breast enhancement surgery  1987  . Bladder repair      x2  . Rectal prolapse repair      x1  . Nm myoview ltd  03/2005  EF 56%, no ischemia  . Shoulder surgery  09/2008, 01/2009    Right shoulder surgery, then redone with lysis of adhesions  . Cervical discectomy  11/2009    with synthetic discs inserted--  Dr Arnoldo Morale  . Cardiac catheterization      [facility]    Family History  Problem Relation Age of Onset  . Diabetes Mother   . Heart disease Mother   . Hypertension Mother   . Ovarian cancer Mother   . Melanoma Mother   . Cancer Father   . Coronary artery disease Father   . Heart disease Father   . Diabetes Sister   . Depression Sister   . Coronary artery disease Sister   . Heart disease Sister   . Rheum arthritis Maternal Aunt   . Breast cancer Maternal Grandmother   . Diabetes Maternal Grandmother   . Diabetes Maternal Grandfather   . Colon cancer Neg Hx   . Heart attack Paternal Uncle   . Heart attack Cousin   . Heart attack  Paternal Uncle     Social History   Social History  . Marital Status: Married    Spouse Name: N/A  . Number of Children: 5  . Years of Education: N/A   Occupational History  . homemaker    Social History Main Topics  . Smoking status: Never Smoker   . Smokeless tobacco: Never Used  . Alcohol Use: No  . Drug Use: No  . Sexual Activity: Not on file   Other Topics Concern  . Not on file   Social History Narrative   Home maker   Doesn't exercise      [name] sister         Review of Systems  Having stomach issues again--upset stomach and diarrhea Weight is about the same     Objective:   Physical Exam  Constitutional: Shannon [name] appears well-developed and well-nourished. No distress.  HENT:  No sinus tenderness  Pulmonary/Chest: Effort normal and breath sounds normal. No respiratory distress. Shannon [name] has no wheezes. Shannon [name] has no rales.          Assessment & Plan:

## 2015-08-11 ENCOUNTER — Other Ambulatory Visit: Payer: Self-pay | Admitting: Unknown Physician Specialty

## 2015-08-11 DIAGNOSIS — R1012 Left upper quadrant pain: Secondary | ICD-10-CM

## 2015-08-17 ENCOUNTER — Ambulatory Visit
Admission: RE | Admit: 2015-08-17 | Discharge: 2015-08-17 | Disposition: A | Discharge status: Home / Self Care | Payer: 59 | Source: Ambulatory Visit | Attending: Unknown Physician Specialty | Admitting: Unknown Physician Specialty

## 2015-08-17 DIAGNOSIS — K760 Fatty (change of) liver, not elsewhere classified: Secondary | ICD-10-CM | POA: Diagnosis not present

## 2015-08-17 DIAGNOSIS — I700 Atherosclerosis of aorta: Secondary | ICD-10-CM | POA: Diagnosis not present

## 2015-08-17 DIAGNOSIS — R1012 Left upper quadrant pain: Secondary | ICD-10-CM

## 2015-08-17 LAB — POCT I-STAT CREATININE: CREATININE: 1 mg/dL (ref 0.44–1.00)

## 2015-08-17 MED ORDER — IOHEXOL 300 MG/ML  SOLN
100.0000 mL | Freq: Once | INTRAMUSCULAR | Status: AC | PRN
  Administered 2015-08-17: 100 mL via INTRAVENOUS

## 2015-08-30 ENCOUNTER — Encounter: Payer: Self-pay | Admitting: Internal Medicine

## 2015-08-30 MED ORDER — ONDANSETRON HCL 4 MG PO TABS: ORAL_TABLET | ORAL | Status: DC

## 2015-09-03 ENCOUNTER — Telehealth: Payer: Self-pay | Admitting: *Deleted

## 2015-09-03 NOTE — Telephone Encounter (Signed)
Lmom to call our office. Time to schedule a echo (1 yr f/u past due).

## 2015-09-15 ENCOUNTER — Encounter: Payer: Self-pay | Admitting: *Deleted

## 2015-10-13 ENCOUNTER — Other Ambulatory Visit: Payer: Self-pay | Admitting: Internal Medicine

## 2015-10-18 ENCOUNTER — Other Ambulatory Visit: Payer: Self-pay | Admitting: *Deleted

## 2015-11-09 ENCOUNTER — Telehealth: Payer: Self-pay | Admitting: Internal Medicine

## 2015-11-09 ENCOUNTER — Ambulatory Visit: Payer: 59 | Admitting: Internal Medicine

## 2015-11-09 NOTE — Telephone Encounter (Signed)
Pt did not come in for their appt today for follow up. Please let me know if pt needs to be contacted immediately for follow up or no follow up needed. Best phone number to contact pt is [phone].

## 2015-11-10 NOTE — Telephone Encounter (Signed)
Please have her reschedule at her earliest convenience

## 2015-11-11 NOTE — Telephone Encounter (Signed)
Okay to waive the no show fee

## 2015-11-11 NOTE — Telephone Encounter (Signed)
Pt is rescheduled for 11/19/15. She apologized for missing the appt, she was going off another appt list that was for her son. She asked about the no show fee, I told her I would send this message.

## 2015-11-19 ENCOUNTER — Ambulatory Visit (INDEPENDENT_AMBULATORY_CARE_PROVIDER_SITE_OTHER): Payer: 59 | Admitting: Internal Medicine

## 2015-11-19 ENCOUNTER — Encounter: Payer: Self-pay | Admitting: Internal Medicine

## 2015-11-19 VITALS — BP 128/80 | HR 90 | Temp 97.9°F | Wt 158.0 lb

## 2015-11-19 DIAGNOSIS — I10 Essential (primary) hypertension: Secondary | ICD-10-CM | POA: Diagnosis not present

## 2015-11-19 DIAGNOSIS — G479 Sleep disorder, unspecified: Secondary | ICD-10-CM | POA: Diagnosis not present

## 2015-11-19 DIAGNOSIS — E785 Hyperlipidemia, unspecified: Secondary | ICD-10-CM

## 2015-11-19 DIAGNOSIS — E119 Type 2 diabetes mellitus without complications: Secondary | ICD-10-CM

## 2015-11-19 LAB — CBC WITH DIFFERENTIAL/PLATELET
BASOS PCT: 0.5 % (ref 0.0–3.0)
Basophils Absolute: 0 10*3/uL (ref 0.0–0.1)
EOS ABS: 0.4 10*3/uL (ref 0.0–0.7)
EOS PCT: 5.5 % — AB (ref 0.0–5.0)
HEMATOCRIT: 40.3 % (ref 36.0–46.0)
HEMOGLOBIN: 13.2 g/dL (ref 12.0–15.0)
LYMPHS PCT: 32.4 % (ref 12.0–46.0)
Lymphs Abs: 2.4 10*3/uL (ref 0.7–4.0)
MCHC: 32.8 g/dL (ref 30.0–36.0)
MCV: 87.1 fl (ref 78.0–100.0)
MONOS PCT: 8.7 % (ref 3.0–12.0)
Monocytes Absolute: 0.6 10*3/uL (ref 0.1–1.0)
Neutro Abs: 3.9 10*3/uL (ref 1.4–7.7)
Neutrophils Relative %: 52.9 % (ref 43.0–77.0)
Platelets: 293 10*3/uL (ref 150.0–400.0)
RBC: 4.63 Mil/uL (ref 3.87–5.11)
RDW: 14.1 % (ref 11.5–15.5)
WBC: 7.4 10*3/uL (ref 4.0–10.5)

## 2015-11-19 LAB — COMPREHENSIVE METABOLIC PANEL
ALBUMIN: 4.2 g/dL (ref 3.5–5.2)
ALT: 24 U/L (ref 0–35)
AST: 17 U/L (ref 0–37)
Alkaline Phosphatase: 84 U/L (ref 39–117)
BUN: 14 mg/dL (ref 6–23)
CALCIUM: 10.2 mg/dL (ref 8.4–10.5)
CHLORIDE: 99 meq/L (ref 96–112)
CO2: 29 mEq/L (ref 19–32)
Creatinine, Ser: 0.91 mg/dL (ref 0.40–1.20)
GFR: 67.54 mL/min (ref >60.00)
Glucose, Bld: 199 mg/dL — ABNORMAL HIGH (ref 70–99)
POTASSIUM: 4.3 meq/L (ref 3.5–5.1)
Sodium: 136 mEq/L (ref 135–145)
Total Bilirubin: 0.3 mg/dL (ref 0.2–1.2)
Total Protein: 7 g/dL (ref 6.0–8.3)

## 2015-11-19 LAB — HEMOGLOBIN A1C: HEMOGLOBIN A1C: 7.8 % — AB (ref 4.6–6.5)

## 2015-11-19 LAB — T4, FREE: Free T4: 0.99 ng/dL (ref 0.60–1.60)

## 2015-11-19 NOTE — Assessment & Plan Note (Signed)
BP Readings from Last 3 Encounters:  11/19/15 128/80  08/09/15 110/70  05/10/15 110/70   Good control No change needed

## 2015-11-19 NOTE — Assessment & Plan Note (Signed)
Hopefully good control Due for lab

## 2015-11-19 NOTE — Assessment & Plan Note (Signed)
Hold on another statin due to possible colitis with atorvastatin Will recheck next time

## 2015-11-19 NOTE — Progress Notes (Signed)
Pre visit review using our clinic review tool, if applicable. No additional management support is needed unless otherwise documented below in the visit note. 

## 2015-11-19 NOTE — Patient Instructions (Addendum)
Please try melatonin 3mg  at bedtime. You can increase to 6mg  or even 9mg  if needed to see if it will help your sleep. If this doesn't help after a few weeks, you can try increasing the trazodone to 300mg  at bedtime.

## 2015-11-19 NOTE — Assessment & Plan Note (Signed)
Worse lately Now very fatigued Will check labs Try adding melatonin---or then increasing trazodone prn

## 2015-11-19 NOTE — Progress Notes (Signed)
Subjective:    Patient ID: Shannon [name], female    DOB: [birth year], 59 y.o.   MRN: [id number]  HPI Here for follow up of diabetes and other medical conditions  Stomach has been good No intestinal problems No pain and no blood  Hasn't been checking sugars Same metformin dose Had a low sugar last night--despite eating supper (nausea, headache and it was low) UTD with eye exam No sores, pain or numbness in feet  No chest pain Still gets palpitations--- variable times and brief. Will strain (Valsalva) if lasts a while and this relieves No SOB Tries to walk--strolls Still gets some chest tightness if she pushes it--same as before reassuring stress echo 2 years ago No edema No dizziness or syncope  Discussed cholesterol again We are both unsure  Current Outpatient Prescriptions on File Prior to Visit  Medication Sig Dispense Refill  . brimonidine (ALPHAGAN) 0.2 % ophthalmic solution     . metFORMIN (GLUCOPHAGE-XR) 500 MG 24 hr tablet Take 2 tablets (1,000 mg total) by mouth daily with breakfast. 180 tablet 3  . metoprolol succinate (TOPROL-XL) 25 MG 24 hr tablet Take 1 tablet by mouth  daily 90 tablet 3  . ondansetron (ZOFRAN) 4 MG tablet TAKE ONE TABLET BY MOUTH THREE TIMES DAILY AS NEEDED FOR NAUSEA AND VOMITING 60 tablet 0  . ONE TOUCH ULTRA TEST test strip USE ONE STRIP TO CHECK GLUCOSE ONCE DAILY AND AS NEEDED 100 each 3  . ONETOUCH DELICA LANCETS FINE MISC Check blood sugar once daily and as needed. 250.00 100 each 1  . pantoprazole (PROTONIX) 40 MG tablet Take 1 tablet by mouth  daily 180 tablet 0  . traMADol (ULTRAM) 50 MG tablet TAKE ONE TABLET BY MOUTH TWICE DAILY AS NEEDED 60 tablet 0  . traZODone (DESYREL) 100 MG tablet Take 2 tablets by mouth  every night at bedtime 180 tablet 3  . triamterene-hydrochlorothiazide (MAXZIDE-25) 37.5-25 MG per tablet Take 1 tablet by mouth once a day 90 tablet 3  . valACYclovir (VALTREX) 1000 MG tablet Take 2 tablets (2,000 mg total)  by mouth daily as needed (at the onset of symptoms and take 2 tablets 12 hours later). 90 tablet 0   No current facility-administered medications on file prior to visit.    Allergies  Allergen Reactions  . Clarithromycin   . Lisinopril     REACTION: cough  . Nitrofurantoin   . Oxycodone-Acetaminophen     REACTION: itching  . Atorvastatin Diarrhea    May have caused colitis--but not clear cut    Past Medical History  Diagnosis Date  . Asthma   . GERD (gastroesophageal reflux disease)   . Hypertension   . Osteopenia   . Mitral valve prolapse   . Glaucoma suspect   . Sleep disturbance   . Allergy   . Depression   . Shingles 11/2006  . Diabetes mellitus 11/12  . Hyperlipidemia     Past Surgical History  Procedure Laterality Date  . Abdominal hysterectomy  2000    /BSO - abn. Paps  . Appendectomy  1978  . Breast enhancement surgery  1987  . Bladder repair      x2  . Rectal prolapse repair      x1  . Nm myoview ltd  03/2005    EF 56%, no ischemia  . Shoulder surgery  09/2008, 01/2009    Right shoulder surgery, then redone with lysis of adhesions  . Cervical discectomy  11/2009  with synthetic discs inserted--  Dr Arnoldo Morale  . Cardiac catheterization      [facility]    Family History  Problem Relation Age of Onset  . Diabetes Mother   . Heart disease Mother   . Hypertension Mother   . Ovarian cancer Mother   . Melanoma Mother   . Cancer Father   . Coronary artery disease Father   . Heart disease Father   . Diabetes Sister   . Depression Sister   . Coronary artery disease Sister   . Heart disease Sister   . Rheum arthritis Maternal Aunt   . Breast cancer Maternal Grandmother   . Diabetes Maternal Grandmother   . Diabetes Maternal Grandfather   . Colon cancer Neg Hx   . Heart attack Paternal Uncle   . Heart attack Cousin   . Heart attack Paternal Uncle     Social History   Social History  . Marital Status: Married    Spouse Name: N/A  . Number of  Children: 5  . Years of Education: N/A   Occupational History  . homemaker    Social History Main Topics  . Smoking status: Never Smoker   . Smokeless tobacco: Never Used  . Alcohol Use: No  . Drug Use: No  . Sexual Activity: Not on file   Other Topics Concern  . Not on file   Social History Narrative   Home maker   Doesn't exercise      [name] sister         Review of Systems  Some increased trouble initiating sleep---?holiday season. Often only sleeps 5 hours Feels exhausted a lot of the time Appetite is okay--less than in past Weight stable    Objective:   Physical Exam  Constitutional: She appears well-developed and well-nourished. No distress.  Neck: Normal range of motion. Neck supple. No thyromegaly present.  Cardiovascular: Normal rate, regular rhythm, normal heart sounds and intact distal pulses.  Exam reveals no gallop.   No murmur heard. Pulmonary/Chest: Effort normal and breath sounds normal. No respiratory distress. She has no wheezes. She has no rales.  Abdominal: Soft. She exhibits no mass. There is no tenderness.  Musculoskeletal: She exhibits no edema or tenderness.  Lymphadenopathy:    She has no cervical adenopathy.  Skin:  No foot lesions  Psychiatric: She has a normal mood and affect. Her behavior is normal.          Assessment & Plan:

## 2015-12-03 ENCOUNTER — Other Ambulatory Visit: Payer: Self-pay | Admitting: Internal Medicine

## 2015-12-03 NOTE — Telephone Encounter (Signed)
Approved: #60 x 0 for each 

## 2015-12-03 NOTE — Telephone Encounter (Signed)
Tramadol 03/30/15 zofran 08/30/15

## 2015-12-03 NOTE — Telephone Encounter (Signed)
rx called into pharmacy

## 2015-12-16 LAB — HM DIABETES EYE EXAM

## 2015-12-27 ENCOUNTER — Encounter: Payer: Self-pay | Admitting: Internal Medicine

## 2016-02-27 ENCOUNTER — Other Ambulatory Visit: Payer: Self-pay | Admitting: Internal Medicine

## 2016-02-28 NOTE — Telephone Encounter (Signed)
Last OV 11-19-15 Next OV 03-27-16 Last Rx for Trazodone 03-29-15 #180/3

## 2016-02-28 NOTE — Telephone Encounter (Signed)
Approved: okay both for a year 

## 2016-03-01 ENCOUNTER — Telehealth: Payer: Self-pay | Admitting: Internal Medicine

## 2016-03-01 MED ORDER — VALACYCLOVIR HCL 1 G PO TABS: 2000.0000 mg | ORAL_TABLET | Freq: Every day | ORAL | Status: DC | PRN

## 2016-03-01 NOTE — Telephone Encounter (Signed)
Left message to call office

## 2016-03-01 NOTE — Telephone Encounter (Signed)
No records from hospital Not sure about the valtrex but probably needs the flagyl as that is fairly standard for colitis Sounds like she may have thrust--that can be determined in person if she is going to urgent care

## 2016-03-01 NOTE — Telephone Encounter (Signed)
Spoke to patient. She went to an urgent care and they gave her NyStatin for thrush.  She still needs Valtrex for cold sores. I will send that in for her.

## 2016-03-01 NOTE — Telephone Encounter (Signed)
Patient Name: Shatoya Zuelke  DOB: 06-23-1956    Initial Comment Caller States was just released from hospital, today has sore throat, tongue has some white on it and feels sore. was put on a lot of antibiotics. son is just getting over strep.    Nurse Assessment  Nurse: Raphael Gibney, RN, Vanita Ingles Date/Time (Eastern Time): 03/01/2016 10:54:06 AM  Confirm and document reason for call. If symptomatic, describe symptoms. You must click the next button to save text entered. ---Caller states she was discharged from hospital yesterday for colitis and dehydration. Still having diarrhea. Her son is taking amoxicillin for strep. Her tongue is white and has a sore throat. Was on a lot of antibiotics. She has been taking levofloxacin and metronidazole. She can swallow. Not sure if she has a fever.  Has the patient traveled out of the country within the last 30 days? ---No  Does the patient have any new or worsening symptoms? ---Yes  Will a triage be completed? ---Yes  Related visit to physician within the last 2 weeks? ---Yes  Does the PT have any chronic conditions? (i.e. diabetes, asthma, etc.) ---Yes  List chronic conditions. ---mitral valve prolapse; diabetes  Is this a behavioral health or substance abuse call? ---No     Guidelines    Guideline Title Affirmed Question Affirmed Notes  Sore Throat Diabetes mellitus or weak immune system (e.g., HIV positive, cancer chemo, splenectomy, organ transplant)    Final Disposition User   See Physician within 50 Hours Stringer, RN, Vera    Comments  Caller states she needs her Valtrex refilled at [facility] on [address] is also wanting to do if she should continue taking her flagyl and it was recommended to take OTC probiotics Culturelle and she wants to know how many times a day to take it. Please call pt back regarding her refill on her Valtrex, whether or not to continue to take her Flagyl and how many times a day to take her Culturelle. No appts available with Dr.  Silvio Pate. Pt states she will go to urgent care.   Referrals  GO TO FACILITY UNDECIDED   Disagree/Comply: Comply

## 2016-03-07 ENCOUNTER — Encounter: Payer: Self-pay | Admitting: Internal Medicine

## 2016-03-07 ENCOUNTER — Ambulatory Visit (INDEPENDENT_AMBULATORY_CARE_PROVIDER_SITE_OTHER): Payer: 59 | Admitting: Internal Medicine

## 2016-03-07 VITALS — BP 100/76 | HR 90 | Temp 97.5°F | Wt 155.0 lb

## 2016-03-07 DIAGNOSIS — K529 Noninfective gastroenteritis and colitis, unspecified: Secondary | ICD-10-CM | POA: Diagnosis not present

## 2016-03-07 DIAGNOSIS — B001 Herpesviral vesicular dermatitis: Secondary | ICD-10-CM | POA: Diagnosis not present

## 2016-03-07 DIAGNOSIS — K219 Gastro-esophageal reflux disease without esophagitis: Secondary | ICD-10-CM

## 2016-03-07 DIAGNOSIS — E119 Type 2 diabetes mellitus without complications: Secondary | ICD-10-CM

## 2016-03-07 MED ORDER — PANTOPRAZOLE SODIUM 40 MG PO TBEC: DELAYED_RELEASE_TABLET | ORAL | Status: DC

## 2016-03-07 NOTE — Assessment & Plan Note (Signed)
Different type spell than in the past---more like viral gastroenteritis She will request records so I can review No meds for now

## 2016-03-07 NOTE — Assessment & Plan Note (Signed)
Persistent symptoms on daily--does okay with bid

## 2016-03-07 NOTE — Progress Notes (Signed)
Pre visit review using our clinic review tool, if applicable. No additional management support is needed unless otherwise documented below in the visit note. 

## 2016-03-07 NOTE — Progress Notes (Signed)
Subjective:    Patient ID: Shannon [name], female    DOB: 01/09/1956, 60 y.o.   MRN: [id number]  HPI Here for hospital follow up  Was visiting daughter in [place] Started feeling sick--- vomiting, diarrhea, fever to 103 Admitted 4/9 till 4/11 Diagnosed with ?colitis (based on CT scan) Given levofloxacin 500mg  and metronidazole 500mg  tid Done with these now  Feels weak still Stomach is better Diarrhea persisted when she came home--but mostly gone now (now bid and slightly loose) Nausea after eating is still there--but not bad Eating fair No more fever Some abdominal pain in LUQ area  Had thrush---seen at [facility] and got nystatin  Has had ongoing problems with her reflux Having to sit up in bed at night due to acid symptoms Has improved on bid protonix which is what Dr Vira Agar has recommended No swallowing problems or cough from this  Current Outpatient Prescriptions on File Prior to Visit  Medication Sig Dispense Refill  . brimonidine (ALPHAGAN) 0.2 % ophthalmic solution     . LUMIGAN 0.01 % SOLN     . metFORMIN (GLUCOPHAGE-XR) 500 MG 24 hr tablet Take 2 tablets by mouth  daily with breakfast 180 tablet 3  . metoprolol succinate (TOPROL-XL) 25 MG 24 hr tablet Take 1 tablet by mouth  daily 90 tablet 3  . ondansetron (ZOFRAN) 4 MG tablet TAKE ONE TABLET BY MOUTH THREE TIMES DAILY AS NEEDED FOR NAUSEA AND VOMITING 60 tablet 0  . ONE TOUCH ULTRA TEST test strip USE ONE STRIP TO CHECK GLUCOSE ONCE DAILY AND AS NEEDED 100 each 3  . ONETOUCH DELICA LANCETS FINE MISC Check blood sugar once daily and as needed. 250.00 100 each 1  . pantoprazole (PROTONIX) 40 MG tablet Take 1 tablet by mouth  daily (Patient taking differently: Take 2  tablet by mouth  daily) 180 tablet 0  . traMADol (ULTRAM) 50 MG tablet TAKE ONE TABLET BY MOUTH TWICE DAILY AS NEEDED 60 tablet 0  . traZODone (DESYREL) 100 MG tablet Take 2 tablets by mouth  every night at bedtime 180 tablet 3  .  triamterene-hydrochlorothiazide (MAXZIDE-25) 37.5-25 MG per tablet Take 1 tablet by mouth once a day 90 tablet 3  . valACYclovir (VALTREX) 1000 MG tablet Take 2 tablets (2,000 mg total) by mouth daily as needed (at the onset of symptoms and take 2 tablets 12 hours later). 90 tablet 0   No current facility-administered medications on file prior to visit.    Allergies  Allergen Reactions  . Clarithromycin   . Lisinopril     REACTION: cough  . Nitrofurantoin   . Oxycodone-Acetaminophen     REACTION: itching  . Atorvastatin Diarrhea    May have caused colitis--but not clear cut    Past Medical History  Diagnosis Date  . Asthma   . GERD (gastroesophageal reflux disease)   . Hypertension   . Osteopenia   . Mitral valve prolapse   . Glaucoma suspect   . Sleep disturbance   . Allergy   . Depression   . Shingles 11/2006  . Diabetes mellitus 11/12  . Hyperlipidemia     Past Surgical History  Procedure Laterality Date  . Abdominal hysterectomy  2000    /BSO - abn. Paps  . Appendectomy  1978  . Breast enhancement surgery  1987  . Bladder repair      x2  . Rectal prolapse repair      x1  . Nm myoview ltd  03/2005    EF 56%, no ischemia  . Shoulder surgery  09/2008, 01/2009    Right shoulder surgery, then redone with lysis of adhesions  . Cervical discectomy  11/2009    with synthetic discs inserted--  Dr Arnoldo Morale  . Cardiac catheterization      [facility]    Family History  Problem Relation Age of Onset  . Diabetes Mother   . Heart disease Mother   . Hypertension Mother   . Ovarian cancer Mother   . Melanoma Mother   . Cancer Father   . Coronary artery disease Father   . Heart disease Father   . Diabetes Sister   . Depression Sister   . Coronary artery disease Sister   . Heart disease Sister   . Rheum arthritis Maternal Aunt   . Breast cancer Maternal Grandmother   . Diabetes Maternal Grandmother   . Diabetes Maternal Grandfather   . Colon cancer Neg Hx   . Heart  attack Paternal Uncle   . Heart attack Cousin   . Heart attack Paternal Uncle     Social History   Social History  . Marital Status: Married    Spouse Name: N/A  . Number of Children: 5  . Years of Education: N/A   Occupational History  . homemaker    Social History Main Topics  . Smoking status: Never Smoker   . Smokeless tobacco: Never Used  . Alcohol Use: No  . Drug Use: No  . Sexual Activity: Not on file   Other Topics Concern  . 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 No distress.  Neck: Normal range of motion. Neck supple. No thyromegaly present.  Cardiovascular: Normal rate, regular rhythm and normal heart sounds.  Exam reveals no gallop.   No murmur heard. Pulmonary/Chest: Effort normal and breath sounds normal. No respiratory distress. She has no wheezes. She has no rales.  Abdominal: Soft. Bowel sounds are normal. She exhibits no distension and no mass. There is no rebound and no guarding.  Mild tenderness in scattered places  Musculoskeletal: She exhibits no edema.  Lymphadenopathy:    She has no cervical adenopathy.          Assessment & Plan:

## 2016-03-07 NOTE — Assessment & Plan Note (Signed)
Sugars generally okay  142 today though Lab Results  Component Value Date   HGBA1C 7.8* 11/19/2015

## 2016-03-07 NOTE — Assessment & Plan Note (Signed)
Does well with valacyclovir

## 2016-04-25 ENCOUNTER — Other Ambulatory Visit: Payer: Self-pay | Admitting: Internal Medicine

## 2016-05-02 ENCOUNTER — Encounter: Payer: Self-pay | Admitting: Internal Medicine

## 2016-05-03 MED ORDER — ONDANSETRON HCL 4 MG PO TABS: ORAL_TABLET | ORAL | Status: DC

## 2016-05-03 NOTE — Telephone Encounter (Signed)
Rx sent electronically.  

## 2016-05-17 ENCOUNTER — Ambulatory Visit (INDEPENDENT_AMBULATORY_CARE_PROVIDER_SITE_OTHER): Payer: 59 | Admitting: Internal Medicine

## 2016-05-17 ENCOUNTER — Encounter: Payer: Self-pay | Admitting: Internal Medicine

## 2016-05-17 VITALS — BP 98/70 | HR 97 | Temp 97.9°F | Ht 64.0 in | Wt 154.5 lb

## 2016-05-17 DIAGNOSIS — K529 Noninfective gastroenteritis and colitis, unspecified: Secondary | ICD-10-CM | POA: Diagnosis not present

## 2016-05-17 DIAGNOSIS — Z0000 Encounter for general adult medical examination without abnormal findings: Secondary | ICD-10-CM

## 2016-05-17 DIAGNOSIS — K219 Gastro-esophageal reflux disease without esophagitis: Secondary | ICD-10-CM

## 2016-05-17 DIAGNOSIS — E119 Type 2 diabetes mellitus without complications: Secondary | ICD-10-CM

## 2016-05-17 LAB — CBC WITH DIFFERENTIAL/PLATELET
BASOS PCT: 0.3 % (ref 0.0–3.0)
Basophils Absolute: 0 10*3/uL (ref 0.0–0.1)
EOS ABS: 0.2 10*3/uL (ref 0.0–0.7)
Eosinophils Relative: 2.9 % (ref 0.0–5.0)
HEMATOCRIT: 41.3 % (ref 36.0–46.0)
Hemoglobin: 13.7 g/dL (ref 12.0–15.0)
LYMPHS PCT: 27.9 % (ref 12.0–46.0)
Lymphs Abs: 2.3 10*3/uL (ref 0.7–4.0)
MCHC: 33.2 g/dL (ref 30.0–36.0)
MCV: 85.3 fl (ref 78.0–100.0)
MONOS PCT: 7.8 % (ref 3.0–12.0)
Monocytes Absolute: 0.6 10*3/uL (ref 0.1–1.0)
NEUTROS ABS: 5 10*3/uL (ref 1.4–7.7)
Neutrophils Relative %: 61.1 % (ref 43.0–77.0)
PLATELETS: 337 10*3/uL (ref 150.0–400.0)
RBC: 4.83 Mil/uL (ref 3.87–5.11)
RDW: 13.6 % (ref 11.5–15.5)
WBC: 8.2 10*3/uL (ref 4.0–10.5)

## 2016-05-17 LAB — HM DIABETES FOOT EXAM

## 2016-05-17 LAB — COMPREHENSIVE METABOLIC PANEL
ALT: 35 U/L (ref 0–35)
AST: 26 U/L (ref 0–37)
Albumin: 4.6 g/dL (ref 3.5–5.2)
Alkaline Phosphatase: 83 U/L (ref 39–117)
BUN: 14 mg/dL (ref 6–23)
CALCIUM: 10.6 mg/dL — AB (ref 8.4–10.5)
CO2: 28 meq/L (ref 19–32)
CREATININE: 1.07 mg/dL (ref 0.40–1.20)
Chloride: 97 mEq/L (ref 96–112)
GFR: 55.93 mL/min — ABNORMAL LOW (ref >60.00)
Glucose, Bld: 168 mg/dL — ABNORMAL HIGH (ref 70–99)
Potassium: 4.5 mEq/L (ref 3.5–5.1)
SODIUM: 135 meq/L (ref 135–145)
Total Bilirubin: 0.4 mg/dL (ref 0.2–1.2)
Total Protein: 7.6 g/dL (ref 6.0–8.3)

## 2016-05-17 LAB — LIPID PANEL
CHOL/HDL RATIO: 5
Cholesterol: 229 mg/dL — ABNORMAL HIGH (ref 0–200)
HDL: 48.7 mg/dL (ref >39.00)
NONHDL: 180.43
Triglycerides: 347 mg/dL — ABNORMAL HIGH (ref 0.0–149.0)
VLDL: 69.4 mg/dL — ABNORMAL HIGH (ref 0.0–40.0)

## 2016-05-17 LAB — MICROALBUMIN / CREATININE URINE RATIO
CREATININE, U: 38.6 mg/dL
Microalb Creat Ratio: 1.8 mg/g (ref 0.0–30.0)

## 2016-05-17 LAB — T4, FREE: Free T4: 0.86 ng/dL (ref 0.60–1.60)

## 2016-05-17 LAB — HEMOGLOBIN A1C: HEMOGLOBIN A1C: 7.4 % — AB (ref 4.6–6.5)

## 2016-05-17 LAB — LDL CHOLESTEROL, DIRECT: Direct LDL: 152 mg/dL

## 2016-05-17 NOTE — Assessment & Plan Note (Signed)
Intermittent symptoms On aggressive regimen

## 2016-05-17 NOTE — Progress Notes (Signed)
Pre visit review using our clinic review tool, if applicable. No additional management support is needed unless otherwise documented below in the visit note. 

## 2016-05-17 NOTE — Progress Notes (Signed)
Subjective:    Patient ID: Shannon [name], female    DOB: [birth year], 58 y.o.   MRN: [id number]  HPI Here for physical  Doing okay Has had some flares of stomach issues Will have some post prandial vomiting Pain persists but not as bad Bowels are normal--no recent diarrhea  Checks sugars once every 2 weeks Usually around 130 No recent serious hypoglycemic spells --several mild ones Occasional shooting foot pains --plantar and laterally  For 2 weeks, she had bad palpitations Mild in AM-- but then worse as the day went on Not racing--just would jolt her (skipping and flopping) HR over 100 at times  Plans to restart walking Seemed to get better after preacher prayed for her  Current Outpatient Prescriptions on File Prior to Visit  Medication Sig Dispense Refill  . brimonidine (ALPHAGAN) 0.2 % ophthalmic solution     . LUMIGAN 0.01 % SOLN     . metFORMIN (GLUCOPHAGE-XR) 500 MG 24 hr tablet Take 2 tablets by mouth  daily with breakfast 180 tablet 3  . metoprolol succinate (TOPROL-XL) 25 MG 24 hr tablet Take 1 tablet by mouth  daily 90 tablet 3  . ondansetron (ZOFRAN) 4 MG tablet TAKE ONE TABLET BY MOUTH THREE TIMES DAILY AS NEEDED FOR NAUSEA AND VOMITING 60 tablet 0  . ONE TOUCH ULTRA TEST test strip USE ONE STRIP TO CHECK GLUCOSE ONCE DAILY AND AS NEEDED 100 each 3  . ONETOUCH DELICA LANCETS FINE MISC Check blood sugar once daily and as needed. 250.00 100 each 1  . pantoprazole (PROTONIX) 40 MG tablet Take 2  tablet by mouth  daily 180 tablet 3  . sucralfate (CARAFATE) 1 g tablet TAKE ONE TABLET BY MOUTH THREE TIMES DAILY BEFORE MEALS 90 tablet 0  . traMADol (ULTRAM) 50 MG tablet TAKE ONE TABLET BY MOUTH TWICE DAILY AS NEEDED 60 tablet 0  . traZODone (DESYREL) 100 MG tablet Take 2 tablets by mouth  every night at bedtime 180 tablet 3  . triamterene-hydrochlorothiazide (MAXZIDE-25) 37.5-25 MG per tablet Take 1 tablet by mouth once a day 90 tablet 3  . valACYclovir (VALTREX) 1000  MG tablet Take 2 tablets (2,000 mg total) by mouth daily as needed (at the onset of symptoms and take 2 tablets 12 hours later). 90 tablet 0   No current facility-administered medications on file prior to visit.    Allergies  Allergen Reactions  . Clarithromycin   . Lisinopril     REACTION: cough  . Nitrofurantoin   . Oxycodone-Acetaminophen     REACTION: itching  . Atorvastatin Diarrhea    May have caused colitis--but not clear cut    Past Medical History  Diagnosis Date  . Asthma   . GERD (gastroesophageal reflux disease)   . Hypertension   . Osteopenia   . Mitral valve prolapse   . Glaucoma suspect   . Sleep disturbance   . Allergy   . Depression   . Shingles 11/2006  . Diabetes mellitus 11/12  . Hyperlipidemia     Past Surgical History  Procedure Laterality Date  . Abdominal hysterectomy  2000    /BSO - abn. Paps  . Appendectomy  1978  . Breast enhancement surgery  1987  . Bladder repair      x2  . Rectal prolapse repair      x1  . [place]m myoview ltd  03/2005    EF 56%, no ischemia  . Shoulder surgery  09/2008, 01/2009    Right shoulder  surgery, then redone with lysis of adhesions  . Cervical discectomy  11/2009    with synthetic discs inserted--  Dr Arnoldo Morale  . Cardiac catheterization      [facility]    Family History  Problem Relation Age of Onset  . Diabetes Mother   . Heart disease Mother   . Hypertension Mother   . Ovarian cancer Mother   . Melanoma Mother   . Cancer Father   . Coronary artery disease Father   . Heart disease Father   . Diabetes Sister   . Depression Sister   . Coronary artery disease Sister   . Heart disease Sister   . Rheum arthritis Maternal Aunt   . Breast cancer Maternal Grandmother   . Diabetes Maternal Grandmother   . Diabetes Maternal Grandfather   . Colon cancer Neg Hx   . Heart attack Paternal Uncle   . Heart attack Cousin   . Heart attack Paternal Uncle     Social History   Social History  . Marital Status:  Married    Spouse Name: N/A  . Number of Children: 5  . Years of Education: N/A   Occupational History  . homemaker    Social History Main Topics  . Smoking status: Never Smoker   . Smokeless tobacco: Never Used  . Alcohol Use: No  . Drug Use: No  . Sexual Activity: Not on file   Other Topics Concern  . Not on file   Social History Narrative   Home maker   Doesn't exercise      Shannon [name] sister         Review of Systems  Constitutional: Positive for fatigue. Negative for unexpected weight change.       Wears seat belt  HENT: Negative for dental problem, hearing loss and tinnitus.        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   Cardiovascular: Positive for palpitations. Negative for chest pain and leg swelling.  Gastrointestinal: Positive for nausea and abdominal pain. Negative for constipation and blood in stool.  Endocrine: Negative for polydipsia and polyuria.  Genitourinary: Positive for frequency and dyspareunia. Negative for dysuria and hematuria.       Went to walk in several days ago--bladder infection Better with antibiotic  Musculoskeletal: Positive for back pain and arthralgias. Negative for joint swelling.       Uses tramadol if pain very bad Tylenol not good--rare ibuprofen  Skin: Negative for rash.       No suspicious lesions  Allergic/Immunologic: Positive for environmental allergies. Negative for immunocompromised state.       Prefers no meds--but may need to restart  Neurological: Positive for headaches. Negative for dizziness, syncope and light-headedness.       Headaches are worse-- migraine and other (exedrin migraine helps)  Hematological: Negative for adenopathy. Does not bruise/bleed easily.  Psychiatric/Behavioral: Negative for sleep disturbance and dysphoric mood. The patient is not nervous/anxious.          Objective:   Physical Exam  Constitutional: She is oriented to person, place, and time. She appears well-developed and well-nourished. No distress.  HENT:  Head: Normocephalic and atraumatic.  Right Ear: External ear normal.  Left Ear: External ear normal.  Mouth/Throat: Oropharynx is clear and moist. No oropharyngeal exudate.  Eyes: Pupils are equal, round, and reactive to light.  bilat  conj injection  Neck: Normal range of motion. Neck supple. No thyromegaly present.  Cardiovascular: Normal rate, regular rhythm, normal heart sounds and intact distal pulses.  Exam reveals no gallop.   No murmur heard. Pulmonary/Chest: Effort normal and breath sounds normal. No respiratory distress. She has no wheezes. She has no rales.  Abdominal: She exhibits no mass. There is no rebound and no guarding.  Mild generalized tenderness  Musculoskeletal: She exhibits no edema or tenderness.  Lymphadenopathy:    She has no cervical adenopathy.  Neurological: She is alert and oriented to person, place, and time.  Sensation intact in feet to monofilament  Skin: No rash noted. No erythema.  No foot lesions  Psychiatric: She has a normal mood and affect. Her behavior is normal.          Assessment & Plan:

## 2016-05-17 NOTE — Assessment & Plan Note (Signed)
Seems to be under control still Will check A1c

## 2016-05-17 NOTE — Assessment & Plan Note (Signed)
Seems quiet now

## 2016-05-17 NOTE — Addendum Note (Signed)
Addended by: Ellamae Sia on: 05/17/2016 12:17 PM   Modules accepted: Miquel Dunn

## 2016-05-17 NOTE — Assessment & Plan Note (Signed)
Needs to get back to exercise Mammogram is due Colon due 2021 No pap (hyster)

## 2016-05-17 NOTE — Patient Instructions (Signed)
Please set up your screening mammogram. 

## 2016-05-18 ENCOUNTER — Other Ambulatory Visit: Payer: Self-pay | Admitting: Internal Medicine

## 2016-05-22 ENCOUNTER — Other Ambulatory Visit: Payer: Self-pay | Admitting: Internal Medicine

## 2016-05-22 DIAGNOSIS — Z1231 Encounter for screening mammogram for malignant neoplasm of breast: Secondary | ICD-10-CM

## 2016-06-08 ENCOUNTER — Ambulatory Visit
Admission: RE | Admit: 2016-06-08 | Discharge: 2016-06-08 | Disposition: A | Discharge status: Home / Self Care | Payer: 59 | Source: Ambulatory Visit | Attending: Internal Medicine | Admitting: Internal Medicine

## 2016-06-08 ENCOUNTER — Other Ambulatory Visit: Payer: Self-pay | Admitting: Family Medicine

## 2016-06-08 ENCOUNTER — Other Ambulatory Visit: Payer: Self-pay | Admitting: Internal Medicine

## 2016-06-08 DIAGNOSIS — R928 Other abnormal and inconclusive findings on diagnostic imaging of breast: Secondary | ICD-10-CM | POA: Diagnosis not present

## 2016-06-08 DIAGNOSIS — Z1231 Encounter for screening mammogram for malignant neoplasm of breast: Secondary | ICD-10-CM

## 2016-06-11 ENCOUNTER — Other Ambulatory Visit: Payer: Self-pay | Admitting: Internal Medicine

## 2016-06-13 ENCOUNTER — Other Ambulatory Visit: Payer: Self-pay | Admitting: Internal Medicine

## 2016-06-13 DIAGNOSIS — N6310 Unspecified lump in the right breast, unspecified quadrant: Secondary | ICD-10-CM

## 2016-06-26 ENCOUNTER — Ambulatory Visit
Admission: RE | Admit: 2016-06-26 | Discharge: 2016-06-26 | Disposition: A | Discharge status: Home / Self Care | Payer: 59 | Source: Ambulatory Visit | Attending: Internal Medicine | Admitting: Internal Medicine

## 2016-06-26 DIAGNOSIS — N63 Unspecified lump in breast: Secondary | ICD-10-CM | POA: Insufficient documentation

## 2016-06-26 DIAGNOSIS — N6310 Unspecified lump in the right breast, unspecified quadrant: Secondary | ICD-10-CM

## 2016-07-10 ENCOUNTER — Encounter: Payer: Self-pay | Admitting: Internal Medicine

## 2016-07-10 ENCOUNTER — Ambulatory Visit (INDEPENDENT_AMBULATORY_CARE_PROVIDER_SITE_OTHER): Payer: 59 | Admitting: Internal Medicine

## 2016-07-10 DIAGNOSIS — B019 Varicella without complication: Secondary | ICD-10-CM | POA: Insufficient documentation

## 2016-07-10 DIAGNOSIS — J0100 Acute maxillary sinusitis, unspecified: Secondary | ICD-10-CM

## 2016-07-10 DIAGNOSIS — B029 Zoster without complications: Secondary | ICD-10-CM | POA: Insufficient documentation

## 2016-07-10 MED ORDER — ONDANSETRON HCL 4 MG PO TABS: ORAL_TABLET | ORAL | 0 refills | Status: DC

## 2016-07-10 MED ORDER — AMOXICILLIN 500 MG PO TABS: 1000.0000 mg | ORAL_TABLET | Freq: Two times a day (BID) | ORAL | 0 refills | Status: AC

## 2016-07-10 NOTE — Patient Instructions (Signed)
Please take the valacyclovir--1 tab three times daily for a week. If you still have new pimples on your back or leg, continue for another 3-7 days till all crusted.

## 2016-07-10 NOTE — Progress Notes (Signed)
Subjective:    Patient ID: Shannon [name], female    DOB: Sep 16, 1956, 60 y.o.   MRN: [id number]  HPI Here due to rash and also respiratory symptoms (sinus)  Having bad thigh pain --out of town last few days Skin was sensitive---had feeling like being burned and tingling Some pain in back--but less evident Then rash on right leg and back--just started 3 days ago  Sinus symptoms started 8-9 days ago Bad headaches while out of town Green nasal discharge Lots of PND---causing cough and waking her up Some low grade fever and chllls No SOB  No ear pain Some sore throat--using chloraseptic Took excedrin migraine for headache--only a little help  Current Outpatient Prescriptions on File Prior to Visit  Medication Sig Dispense Refill  . LUMIGAN 0.01 % SOLN     . metFORMIN (GLUCOPHAGE-XR) 500 MG 24 hr tablet Take 2 tablets by mouth  daily with breakfast 180 tablet 3  . metoprolol succinate (TOPROL-XL) 25 MG 24 hr tablet Take 1 tablet by mouth  daily 90 tablet 3  . ondansetron (ZOFRAN) 4 MG tablet TAKE ONE TABLET BY MOUTH THREE TIMES DAILY AS NEEDED FOR NAUSEA AND VOMITING 60 tablet 0  . ONE TOUCH ULTRA TEST test strip USE ONE STRIP TO CHECK GLUCOSE ONCE DAILY AND AS NEEDED 100 each 3  . ONETOUCH DELICA LANCETS FINE MISC Check blood sugar once daily and as needed. 250.00 100 each 1  . pantoprazole (PROTONIX) 40 MG tablet Take 2  tablet by mouth  daily 180 tablet 3  . sucralfate (CARAFATE) 1 g tablet TAKE ONE TABLET BY MOUTH THREE TIMES DAILY BEFORE MEALS 90 tablet 0  . traMADol (ULTRAM) 50 MG tablet TAKE ONE TABLET BY MOUTH TWICE DAILY AS NEEDED 60 tablet 0  . traZODone (DESYREL) 100 MG tablet Take 2 tablets by mouth  every night at bedtime 180 tablet 3  . triamterene-hydrochlorothiazide (MAXZIDE-25) 37.5-25 MG tablet Take 1 tablet by mouth once a day 90 tablet 1  . valACYclovir (VALTREX) 1000 MG tablet TAKE 2 TABLETS BY MOUTH  DAILY AS NEEDED (AT THE  ONSET OF SYMPTOMS AND TAKE  2 TABS  12 HOURS LATER) 90 tablet 1   No current facility-administered medications on file prior to visit.     Allergies  Allergen Reactions  . Clarithromycin   . Lisinopril     REACTION: cough  . Nitrofurantoin   . Oxycodone-Acetaminophen     REACTION: itching  . Atorvastatin Diarrhea    May have caused colitis--but not clear cut    Past Medical History:  Diagnosis Date  . Allergy   . Asthma   . Depression   . Diabetes mellitus 11/12  . GERD (gastroesophageal reflux disease)   . Glaucoma suspect   . Hyperlipidemia   . Hypertension   . Mitral valve prolapse   . Osteopenia   . Shingles 11/2006  . Sleep disturbance     Past Surgical History:  Procedure Laterality Date  . ABDOMINAL HYSTERECTOMY  2000   /BSO - abn. Paps  . APPENDECTOMY  1978  . AUGMENTATION MAMMAPLASTY Bilateral 1981  . BLADDER REPAIR     x2  . BREAST ENHANCEMENT SURGERY  1987  . [place] DISCECTOMY  11/2009   with synthetic discs inserted--  Dr Arnoldo Morale  . NM MYOVIEW LTD  03/2005   EF 56%, no ischemia  . RECTAL PROLAPSE REPAIR     x1  . SHOULDER SURGERY  09/2008, 01/2009   Right shoulder surgery, then redone with lysis of adhesions    Family History  Problem Relation Age of Onset  . Diabetes Mother   . Heart disease Mother   . Hypertension Mother   . Ovarian cancer Mother   . Melanoma Mother   . Cancer Father   . Coronary artery disease Father   . Heart disease Father   . Diabetes Sister   . Depression Sister   . Coronary artery disease Sister   . Heart disease Sister   . Rheum arthritis Maternal Aunt   . Breast cancer Maternal Grandmother   . Diabetes Maternal Grandmother   . Heart attack Paternal Uncle   . Heart attack Cousin   . Heart attack Paternal Uncle   . Diabetes Maternal Grandfather   . Colon cancer Neg Hx     Social History   Social History  . Marital status: Married    Spouse name: N/A  . Number of children: 5  . Years of education:  N/A   Occupational History  . homemaker    Social History Main Topics  . Smoking status: Never Smoker  . Smokeless tobacco: Never Used  . Alcohol use No  . Drug use: No  . Sexual activity: Not on file   Other Topics Concern  . Not on file   Social History Narrative   Home maker   Doesn't exercise      [name] sister         Review of Systems  No N/V Appetite is okay     Objective:   Physical Exam  Constitutional: She appears well-developed. No distress.  HENT:  Moderate maxillary tenderness Mild pharyngeal injection without exudates TMs normal Mild nasal inflammation  Neck: Normal range of motion. No thyromegaly present.  ?small non tender anterior cervical nodes  Pulmonary/Chest: Effort normal and breath sounds normal. No respiratory distress. She has no wheezes. She has no rales.  Skin:  Classic vesicular rash in 2 spots on right leg (medial) and right back (~L3 dermatome)          Assessment & Plan:

## 2016-07-10 NOTE — Progress Notes (Signed)
Pre visit review using our clinic review tool, if applicable. No additional management support is needed unless otherwise documented below in the visit note. 

## 2016-07-10 NOTE — Assessment & Plan Note (Signed)
Discussed isolating as needed Will treat with valacyclovir

## 2016-07-10 NOTE — Assessment & Plan Note (Signed)
Sick for 9 days Will treat with amoxicillin/symptom relief

## 2016-07-12 ENCOUNTER — Telehealth: Payer: Self-pay | Admitting: Internal Medicine

## 2016-07-12 MED ORDER — GABAPENTIN 300 MG PO CAPS: ORAL_CAPSULE | ORAL | 0 refills | Status: DC

## 2016-07-12 NOTE — Telephone Encounter (Signed)
Pt called. Is asking if there is anything she can take for the pain of the shingles?   New outbreak inside thigh, outside thigh and a little spot on back  Pt has to go to wedding this weekend none of them are crusted over. Is it ok for her to go to wedding?  Pt request c/b [phone]  [facility] [address]

## 2016-07-12 NOTE — Telephone Encounter (Signed)
Okay to send rx for gabapentin 300mg  Can start with 1 at night and then add daytime doses (if not too sedating) as needed #90 x 0  Officially, she probably shouldn't go to wedding. But if the lesions are crusted, or no new ones within 2-3 days, and she keeps them covered--the risk of transmission is very small. She needs to avoid anyone with a compromised immune system (like taking chemotherapy or on injections due to rheumatoid arthritis or psoriasis)

## 2016-07-12 NOTE — Telephone Encounter (Signed)
Spoke to pt. Sent in rx for Gabapentin

## 2016-08-22 ENCOUNTER — Other Ambulatory Visit: Payer: Self-pay | Admitting: Internal Medicine

## 2016-08-22 NOTE — Telephone Encounter (Signed)
Left refill on voice mail at pharmacy  

## 2016-08-22 NOTE — Telephone Encounter (Signed)
Last filled 12-03-15 #60 Last OV 07-10-16 Next OV 11-16-16

## 2016-08-22 NOTE — Telephone Encounter (Signed)
Approved: #[phone] x 0 

## 2016-11-01 ENCOUNTER — Other Ambulatory Visit: Payer: Self-pay | Admitting: Internal Medicine

## 2016-11-16 ENCOUNTER — Encounter: Payer: Self-pay | Admitting: Internal Medicine

## 2016-11-16 ENCOUNTER — Ambulatory Visit (INDEPENDENT_AMBULATORY_CARE_PROVIDER_SITE_OTHER): Payer: 59 | Admitting: Internal Medicine

## 2016-11-16 VITALS — BP 112/70 | HR 87 | Temp 98.1°F | Wt 158.0 lb

## 2016-11-16 DIAGNOSIS — Z23 Encounter for immunization: Secondary | ICD-10-CM

## 2016-11-16 DIAGNOSIS — E1142 Type 2 diabetes mellitus with diabetic polyneuropathy: Secondary | ICD-10-CM | POA: Diagnosis not present

## 2016-11-16 DIAGNOSIS — B9789 Other viral agents as the cause of diseases classified elsewhere: Secondary | ICD-10-CM

## 2016-11-16 DIAGNOSIS — R0789 Other chest pain: Secondary | ICD-10-CM | POA: Insufficient documentation

## 2016-11-16 DIAGNOSIS — J069 Acute upper respiratory infection, unspecified: Secondary | ICD-10-CM | POA: Diagnosis not present

## 2016-11-16 DIAGNOSIS — R079 Chest pain, unspecified: Secondary | ICD-10-CM | POA: Insufficient documentation

## 2016-11-16 NOTE — Assessment & Plan Note (Signed)
If her A1c is sig worse, will change her to regular metformin 1000 bid Very early symptoms suggest neuropathy

## 2016-11-16 NOTE — Assessment & Plan Note (Signed)
Should be self limited Discussed supportive care

## 2016-11-16 NOTE — Progress Notes (Signed)
Subjective:    Patient ID: CHRISHANA ZWIEG, female    DOB: [birth year], 58 y.o.   MRN: [id number]  HPI Here for follow up of diabetes and other chronic health conditions  Having sinus symptoms, headache and irritated throat Started 4 days ago Trying OTC meds  Nasal congestion No fever Slight dry cough No SOB  Notes her sugars are higher She thinks she may be taking a lower dose-- using up her sister's left over (probably 1000 but not controlled release) Checking every week or 2--- 166 this AM. As high as over 200 Did have 1 hypoglycemic reaction 2 days ago-- was very busy and didn't eat much Gets transient burning sensation in feet  No problems with the colits  Has been having intermittent chest pain Gets heavy achy feeling across mid chest--- up towards neck once Bad one night recently--kept her up for a while Also gets some exertional symptoms Some dyspnea with this  Fairly freq nausea---may be with the pain also Clearly notes the pain walking up hills  Current Outpatient Prescriptions on File Prior to Visit  Medication Sig Dispense Refill  . LUMIGAN 0.01 % SOLN     . metFORMIN (GLUCOPHAGE-XR) 500 MG 24 hr tablet Take 2 tablets by mouth  daily with breakfast (Patient taking differently: Take 2 tablets by mouth  daily with breakfast. Pt says she was only taking 1 a day) 180 tablet 3  . metoprolol succinate (TOPROL-XL) 25 MG 24 hr tablet Take 1 tablet by mouth  daily 90 tablet 3  . ONE TOUCH ULTRA TEST test strip USE ONE STRIP TO CHECK GLUCOSE ONCE DAILY AND AS NEEDED 100 each 3  . ONETOUCH DELICA LANCETS FINE MISC Check blood sugar once daily and as needed. 250.00 100 each 1  . pantoprazole (PROTONIX) 40 MG tablet Take 2  tablet by mouth  daily 180 tablet 3  . sucralfate (CARAFATE) 1 g tablet TAKE ONE TABLET BY MOUTH THREE TIMES DAILY BEFORE MEALS 90 tablet 0  . traMADol (ULTRAM) 50 MG tablet TAKE ONE TABLET BY MOUTH TWICE DAILY AS NEEDED 60 tablet 0  . traZODone (DESYREL)  100 MG tablet Take 2 tablets by mouth  every night at bedtime 180 tablet 3  . triamterene-hydrochlorothiazide (MAXZIDE-25) 37.5-25 MG tablet Take 1 tablet by mouth once a day 90 tablet 1  . valACYclovir (VALTREX) 1000 MG tablet TAKE 2 TABLETS BY MOUTH  DAILY AS NEEDED (AT THE  ONSET OF SYMPTOMS AND TAKE  2 TABS 12 HOURS LATER) 90 tablet 1   No current facility-administered medications on file prior to visit.     Allergies  Allergen Reactions  . Clarithromycin   . Lisinopril     REACTION: cough  . Nitrofurantoin   . Oxycodone-Acetaminophen     REACTION: itching  . Atorvastatin Diarrhea    May have caused colitis--but not clear cut    Past Medical History:  Diagnosis Date  . Allergy   . Asthma   . Depression   . Diabetes mellitus 11/12  . GERD (gastroesophageal reflux disease)   . Glaucoma suspect   . Hyperlipidemia   . Hypertension   . Mitral valve prolapse   . Osteopenia   . Shingles 11/2006  . Sleep disturbance     Past Surgical History:  Procedure Laterality Date  . ABDOMINAL HYSTERECTOMY  2000   /BSO - abn. Paps  . APPENDECTOMY  1978  . AUGMENTATION MAMMAPLASTY Bilateral 1981  . BLADDER REPAIR     x2  .  BREAST ENHANCEMENT SURGERY  1987  . [place] DISCECTOMY  11/2009   with synthetic discs inserted--  Dr Arnoldo Morale  . NM MYOVIEW LTD  03/2005   EF 56%, no ischemia  . RECTAL PROLAPSE REPAIR     x1  . SHOULDER SURGERY  09/2008, 01/2009   Right shoulder surgery, then redone with lysis of adhesions    Family History  Problem Relation Age of Onset  . Diabetes Mother   . Heart disease Mother   . Hypertension Mother   . Ovarian cancer Mother   . Melanoma Mother   . Cancer Father   . Coronary artery disease Father   . Heart disease Father   . Diabetes Sister   . Depression Sister   . Coronary artery disease Sister   . Heart disease Sister   . Rheum arthritis Maternal Aunt   . Breast cancer Maternal Grandmother   .  Diabetes Maternal Grandmother   . Heart attack Paternal Uncle   . Heart attack Cousin   . Heart attack Paternal Uncle   . Diabetes Maternal Grandfather   . Colon cancer Neg Hx     Social History   Social History  . Marital status: Married    Spouse name: N/A  . Number of children: 5  . Years of education: N/A   Occupational History  . homemaker    Social History Main Topics  . Smoking status: Never Smoker  . Smokeless tobacco: Never Used  . Alcohol use No  . Drug use: No  . Sexual activity: Not on file   Other Topics Concern  . Not on file   Social History Narrative   Home maker   Doesn't exercise      [name] sister         Review of Systems Appetite is okay Weight is stable Generally sleeps okay No edema    Objective:   Physical Exam  Constitutional: She appears well-developed and well-nourished. No distress.  HENT:  Mouth/Throat: Oropharynx is clear and moist. No oropharyngeal exudate.  Slight pharyngeal injection TMs normal  Neck: Normal range of motion. Neck supple. No thyromegaly present.  Cardiovascular: Normal rate, regular rhythm, normal heart sounds and intact distal pulses.  Exam reveals no gallop.   No murmur heard. Pulmonary/Chest: Effort normal and breath sounds normal. No respiratory distress. She has no wheezes. She has no rales.  Musculoskeletal: She exhibits no edema.  Lymphadenopathy:    She has no cervical adenopathy.  Psychiatric: She has a normal mood and affect. Her behavior is normal.          Assessment & Plan:

## 2016-11-16 NOTE — Progress Notes (Signed)
Pre visit review using our clinic review tool, if applicable. No additional management support is needed unless otherwise documented below in the visit note. 

## 2016-11-16 NOTE — Assessment & Plan Note (Addendum)
Fairly classic anginal pattern other than the 1 night of symptoms Did have regular treadmill ~3 years ago EKG is normal Will go ahead with cardiology evaluation--may need more aggressive diagnostic testing this time

## 2016-11-17 LAB — HEMOGLOBIN A1C: HEMOGLOBIN A1C: 8.2 % — AB (ref 4.6–6.5)

## 2016-11-22 ENCOUNTER — Telehealth: Payer: Self-pay

## 2016-11-22 MED ORDER — METFORMIN HCL 1000 MG PO TABS: 1000.0000 mg | ORAL_TABLET | Freq: Two times a day (BID) | ORAL | 3 refills | Status: DC

## 2016-11-22 NOTE — Telephone Encounter (Signed)
Spoke to pt about lab results. New rx for metformin 1000mg  bid sent to pharmacy

## 2016-11-28 ENCOUNTER — Ambulatory Visit (INDEPENDENT_AMBULATORY_CARE_PROVIDER_SITE_OTHER): Payer: 59 | Admitting: Cardiology

## 2016-11-28 ENCOUNTER — Encounter: Payer: Self-pay | Admitting: Cardiology

## 2016-11-28 VITALS — BP 132/80 | HR 101 | Ht 64.0 in | Wt 156.8 lb

## 2016-11-28 DIAGNOSIS — E782 Mixed hyperlipidemia: Secondary | ICD-10-CM

## 2016-11-28 DIAGNOSIS — R000 Tachycardia, unspecified: Secondary | ICD-10-CM

## 2016-11-28 DIAGNOSIS — I10 Essential (primary) hypertension: Secondary | ICD-10-CM | POA: Diagnosis not present

## 2016-11-28 DIAGNOSIS — R079 Chest pain, unspecified: Secondary | ICD-10-CM | POA: Diagnosis not present

## 2016-11-28 MED ORDER — ASPIRIN EC 81 MG PO TBEC: 81.0000 mg | DELAYED_RELEASE_TABLET | Freq: Every day | ORAL | 3 refills | Status: DC

## 2016-11-28 MED ORDER — METOPROLOL SUCCINATE ER 25 MG PO TB24: 37.5000 mg | ORAL_TABLET | Freq: Every day | ORAL | 6 refills | Status: DC

## 2016-11-28 MED ORDER — NITROGLYCERIN 0.4 MG SL SUBL
0.4000 mg | SUBLINGUAL_TABLET | SUBLINGUAL | 6 refills | Status: DC | PRN
Start: 2016-11-28 — End: 2017-06-18

## 2016-11-28 NOTE — Patient Instructions (Signed)
Medication Instructions:  Your physician has recommended you make the following change in your medication:  1. Increase Metoprolol to 1 & 1/2 tablet Once daily 2. Start Aspirin 81 mg Once daily 3. Nitroglycerin 0.4 mg once under the tongue as needed for chest pain. May repeat one tablet every 5 minutes up to a total of 3. Please review documentation prior to your first dose.  Testing/Procedures: Your physician has requested that you have an echocardiogram. Echocardiography is a painless test that uses sound waves to create images of your heart. It provides your doctor with information about the size and shape of your heart and how well your heart's chambers and valves are working. This procedure takes approximately one hour. There are no restrictions for this procedure.  [place]  Your caregiver has ordered a Stress Test with nuclear imaging. The purpose of this test is to evaluate the blood supply to your heart muscle. This procedure is referred to as a "Non-Invasive Stress Test." This is because other than having an IV started in your vein, nothing is inserted or "invades" your body. Cardiac stress tests are done to find areas of poor blood flow to the heart by determining the extent of coronary artery disease (CAD).   Please note: these test may take anywhere between 2-4 hours to complete  PLEASE REPORT TO [facility] AT THE FIRST DESK WILL DIRECT YOU WHERE TO GO  Date of Procedure:_Monday December 04, 2016 at 09:00AM___  Arrival Time for Procedure:_Arrive at 08:45AM to register___  Instructions regarding medication:   __X__ : Hold diabetes medication metformin (Glucophage) the morning of procedure  __X__:  Hold metoprolol the night before procedure and morning of procedure    PLEASE NOTIFY THE OFFICE AT LEAST 24 HOURS IN ADVANCE IF YOU ARE UNABLE TO KEEP YOUR APPOINTMENT.  [phone] AND  PLEASE NOTIFY NUCLEAR MEDICINE AT [facility] AT LEAST 24 HOURS  IN ADVANCE IF YOU ARE UNABLE TO KEEP YOUR APPOINTMENT. [phone]  How to prepare for your Myoview test:  1. Do not eat or drink after midnight 2. No caffeine for 24 hours prior to test 3. No smoking 24 hours prior to test. 4. Your medication may be taken with water.  If your doctor stopped a medication because of this test, do not take that medication. 5. Ladies, please do not wear dresses.  Skirts or pants are appropriate. Please wear a short sleeve shirt. 6. No perfume, cologne or lotion. 7. Wear comfortable walking shoes. No heels!    Follow-Up: Your physician recommends that you schedule a follow-up appointment after testing with Dr. Yvone Neu.   It was a pleasure seeing you today here in the office. Please do not hesitate to give [place] a call back if you have any further questions. [place], BSN    Echocardiogram An echocardiogram, or echocardiography, uses sound waves (ultrasound) to produce an image of your heart. The echocardiogram is simple, painless, obtained within a short period of time, and offers valuable information to your health care provider. The images from an echocardiogram can provide information such as:  Evidence of coronary artery disease (CAD).  Heart size.  Heart muscle function.  Heart valve function.  Aneurysm detection.  Evidence of a past heart attack.  Fluid buildup around the heart.  Heart muscle thickening.  Assess heart valve function. Tell a health care provider about:  Any allergies you have.  All medicines you are taking, including vitamins, herbs, eye drops,  creams, and over-the-counter medicines.  Any problems you or family members have had with anesthetic medicines.  Any blood disorders you have.  Any surgeries you have had.  Any medical conditions you have.  Whether you are pregnant or may be pregnant. What happens before the procedure? No special preparation is needed. Eat and drink normally. What  happens during the procedure?  In order to produce an image of your heart, gel will be applied to your chest and a wand-like tool (transducer) will be moved over your chest. The gel will help transmit the sound waves from the transducer. The sound waves will harmlessly bounce off your heart to allow the heart images to be captured in real-time motion. These images will then be recorded.  You may need an IV to receive a medicine that improves the quality of the pictures. What happens after the procedure? You may return to your normal schedule including diet, activities, and medicines, unless your health care provider tells you otherwise. This information is not intended to replace advice given to you by your health care provider. Make sure you discuss any questions you have with your health care provider. Document Released: 11/03/2000 Document Revised: 06/24/2016 Document Reviewed: 07/14/2013 Elsevier Interactive Patient Education  2017 [place]. Cardiac Nuclear Scanning A cardiac nuclear scan is used to check your heart for problems, such as the following:  A portion of the heart is not getting enough blood.  Part of the heart muscle has died, which happens with a heart attack.  The heart wall is not working normally.  In this test, a radioactive dye (tracer) is injected into your bloodstream. After the tracer has traveled to your heart, a scanning device is used to measure how much of the tracer is absorbed by or distributed to various areas of your heart. LET [facility] CARE PROVIDER KNOW ABOUT:  Any allergies you have.  All medicines you are taking, including vitamins, herbs, eye drops, creams, and over-the-counter medicines.  Previous problems you or members of your family have had with the use of anesthetics.  Any blood disorders you have.  Previous surgeries you have had.  Medical conditions you have.  RISKS AND COMPLICATIONS Generally, this is a safe procedure.  However, as with any procedure, problems can occur. Possible problems include:   Serious chest pain.  Rapid heartbeat.  Sensation of warmth in your chest. This usually passes quickly. BEFORE THE PROCEDURE Ask your health care provider about changing or stopping your regular medicines. PROCEDURE This procedure is usually done at a hospital and takes 2-4 hours.  An IV tube is inserted into one of your veins.  Your health care provider will inject a small amount of radioactive tracer through the tube.  You will then wait for 20-40 minutes while the tracer travels through your bloodstream.  You will lie down on an exam table so images of your heart can be taken. Images will be taken for about 15-20 minutes.  You will exercise on a treadmill or stationary bike. While you exercise, your heart activity will be monitored with an electrocardiogram (ECG), and your blood pressure will be checked.  If you are unable to exercise, you may be given a medicine to make your heart beat faster.  When blood flow to your heart has peaked, tracer will again be injected through the IV tube.  After 20-40 minutes, you will get back on the exam table and have more images taken of your heart.  When the procedure is  over, your IV tube will be removed. AFTER THE PROCEDURE  You will likely be able to leave shortly after the test. Unless your health care provider tells you otherwise, you may return to your normal schedule, including diet, activities, and medicines.  Make sure you find out how and when you will get your test results. This information is not intended to replace advice given to you by your health care provider. Make sure you discuss any questions you have with your health care provider. Document Released: 12/01/2004 Document Revised: 11/11/2013 Document Reviewed: 10/15/2013 Elsevier Interactive Patient Education  2017 [place]. Nitroglycerin sublingual tablets What is this  medicine? NITROGLYCERIN (nye troe GLI ser in) is a type of vasodilator. It relaxes blood vessels, increasing the blood and oxygen supply to your heart. This medicine is used to relieve chest pain caused by angina. It is also used to prevent chest pain before activities like climbing stairs, going outdoors in cold weather, or sexual activity. This medicine may be used for other purposes; ask your health care provider or pharmacist if you have questions. COMMON BRAND NAME(S): Nitroquick, Nitrostat, Nitrotab What should I tell my health care provider before I take this medicine? They need to know if you have any of these conditions: -anemia -head injury, recent stroke, or bleeding in the brain -liver disease -previous heart attack -an unusual or allergic reaction to nitroglycerin, other medicines, foods, dyes, or preservatives -pregnant or trying to get pregnant -breast-feeding How should I use this medicine? Take this medicine by mouth as needed. At the first sign of an angina attack (chest pain or tightness) place one tablet under your tongue. You can also take this medicine 5 to 10 minutes before an event likely to produce chest pain. Follow the directions on the prescription label. Let the tablet dissolve under the tongue. Do not swallow whole. Replace the dose if you accidentally swallow it. It will help if your mouth is not dry. Saliva around the tablet will help it to dissolve more quickly. Do not eat or drink, smoke or chew tobacco while a tablet is dissolving. If you are not better within 5 minutes after taking ONE dose of nitroglycerin, call 9-1-1 immediately to seek emergency medical care. Do not take more than 3 nitroglycerin tablets over 15 minutes. If you take this medicine often to relieve symptoms of angina, your doctor or health care professional may provide you with different instructions to manage your symptoms. If symptoms do not go away after following these instructions, it is  important to call 9-1-1 immediately. Do not take more than 3 nitroglycerin tablets over 15 minutes. Talk to your pediatrician regarding the use of this medicine in children. Special care may be needed. Overdosage: If you think you have taken too much of this medicine contact a poison control center or emergency room at once. NOTE: This medicine is only for you. Do not share this medicine with others. What if I miss a dose? This does not apply. This medicine is only used as needed. What may interact with this medicine? Do not take this medicine with any of the following medications: -certain migraine medicines like ergotamine and dihydroergotamine (DHE) -medicines used to treat erectile dysfunction like sildenafil, tadalafil, and vardenafil -riociguat This medicine may also interact with the following medications: -alteplase -aspirin -heparin -medicines for high blood pressure -medicines for mental depression -other medicines used to treat angina -phenothiazines like chlorpromazine, mesoridazine, prochlorperazine, thioridazine This list may not describe all possible interactions. Give your health  care provider a list of all the medicines, herbs, non-prescription drugs, or dietary supplements you use. Also tell them if you smoke, drink alcohol, or use illegal drugs. Some items may interact with your medicine. What should I watch for while using this medicine? Tell your doctor or health care professional if you feel your medicine is no longer working. Keep this medicine with you at all times. Sit or lie down when you take your medicine to prevent falling if you feel dizzy or faint after using it. Try to remain calm. This will help you to feel better faster. If you feel dizzy, take several deep breaths and lie down with your feet propped up, or bend forward with your head resting between your knees. You may get drowsy or dizzy. Do not drive, use machinery, or do anything that needs mental  alertness until you know how this drug affects you. Do not stand or sit up quickly, especially if you are an older patient. This reduces the risk of dizzy or fainting spells. Alcohol can make you more drowsy and dizzy. Avoid alcoholic drinks. Do not treat yourself for coughs, colds, or pain while you are taking this medicine without asking your doctor or health care professional for advice. Some ingredients may increase your blood pressure. What side effects may I notice from receiving this medicine? Side effects that you should report to your doctor or health care professional as soon as possible: -blurred vision -dry mouth -skin rash -sweating -the feeling of extreme pressure in the head -unusually weak or tired Side effects that usually do not require medical attention (report to your doctor or health care professional if they continue or are bothersome): -flushing of the face or neck -headache -irregular heartbeat, palpitations -nausea, vomiting This list may not describe all possible side effects. Call your doctor for medical advice about side effects. You may report side effects to FDA at 1-800-FDA-1088. Where should I keep my medicine? Keep out of the reach of children. Store at room temperature between 20 and 25 degrees C (68 and 77 degrees F). Store in [facility] [place]. Protect from light and moisture. Keep tightly closed. Throw away any unused medicine after the expiration date. NOTE: This sheet is a summary. It may not cover all possible information. If you have questions about this medicine, talk to your doctor, pharmacist, or health care provider.  2017 Elsevier/Gold Standard (2013-09-04 17:57:36)

## 2016-11-28 NOTE — Progress Notes (Signed)
Cardiology Office Note   Date:  11/28/2016   ID:  Shannon [name], DOB December 06, 1955, MRN [id number]  Referring Doctor:  Viviana Simpler, MD   Cardiologist:   Wende Bushy, MD   Reason for consultation:  Chief Complaint  Patient presents with  . OTHER    Chest pain and irregular heart beat. Meds reviewed verbally with pt.      History of Present Illness: Shannon [name] is a 61 y.o. female who presents for Chest pain and palpitations.  Symptoms of been going on for about 4 months now. She describes the chest pain as a discomfort in the chest, 4-5 out of 10 in severity, occurs with exertion, relieved with rest. Sometimes, there is an associated achiness of the left arm. Symptoms lasted minutes at a time, increasing frequency and intensity since its onset 4 months ago. Exertional activities include walking up a flight of stairs, walking to the mailbox, collecting and using a wheelbarrow, vacuuming. This is associated with shortness of breath  She also gets palpitations with exertion. The metoprolol doesn't seem to help with this. Moderate in intensity, occurs with exertion, relieved with rest.  In terms of hypertension, she takes medications for high blood pressure. She has not been able to accurately check her blood pressure due to malfunctioning blood pressure monitor.  No PND, orthopnea, edema. No loss of consciousness.   ROS:  Please see the history of present illness. Aside from mentioned under HPI, all other systems are reviewed and negative.     Past Medical History:  Diagnosis Date  . Allergy   . Asthma   . Depression   . Diabetes mellitus 11/12  . GERD (gastroesophageal reflux disease)   . Glaucoma suspect   . Hyperlipidemia   . Hypertension   . Mitral valve prolapse   . Osteopenia   . Shingles 11/2006  . Sleep disturbance   . Tachycardia     Past Surgical History:  Procedure Laterality Date  . ABDOMINAL HYSTERECTOMY  2000   /BSO - abn. [facility]  .  APPENDECTOMY  1978  . AUGMENTATION MAMMAPLASTY Bilateral 1981  . BLADDER REPAIR     x2  . BREAST ENHANCEMENT SURGERY  1987  . [place] DISCECTOMY  11/2009   with synthetic discs inserted--  Dr Arnoldo Morale  . NM MYOVIEW LTD  03/2005   EF 56%, no ischemia  . RECTAL PROLAPSE REPAIR     x1  . SHOULDER SURGERY  09/2008, 01/2009   Right shoulder surgery, then redone with lysis of adhesions     reports that she has never smoked. She has never used smokeless tobacco. She reports that she does not drink alcohol or use drugs.   family history includes Breast cancer in her maternal grandmother; Cancer in her father; Coronary artery disease in her father and sister; Depression in her sister; Diabetes in her maternal grandfather, maternal grandmother, mother, and sister; Heart attack in her cousin, paternal uncle, and paternal uncle; Heart disease in her father, mother, and sister; Hypertension in her mother; Melanoma in her mother; Ovarian cancer in her mother; Rheum arthritis in her maternal aunt.   Outpatient Medications Prior to Visit  Medication Sig Dispense Refill  . LUMIGAN 0.01 % SOLN     . metFORMIN (GLUCOPHAGE) 1000 MG tablet Take 1 tablet (1,000 mg total) by mouth 2 (two) times daily with a meal. 180 tablet 3  . ONE TOUCH ULTRA TEST test strip  USE ONE STRIP TO CHECK GLUCOSE ONCE DAILY AND AS NEEDED 100 each 3  . ONETOUCH DELICA LANCETS FINE MISC Check blood sugar once daily and as needed. 250.00 100 each 1  . pantoprazole (PROTONIX) 40 MG tablet Take 2  tablet by mouth  daily 180 tablet 3  . sucralfate (CARAFATE) 1 g tablet TAKE ONE TABLET BY MOUTH THREE TIMES DAILY BEFORE MEALS 90 tablet 0  . traMADol (ULTRAM) 50 MG tablet TAKE ONE TABLET BY MOUTH TWICE DAILY AS NEEDED 60 tablet 0  . traZODone (DESYREL) 100 MG tablet Take 2 tablets by mouth  every night at bedtime 180 tablet 3  . triamterene-hydrochlorothiazide (MAXZIDE-25) 37.5-25 MG tablet Take 1 tablet  by mouth once a day 90 tablet 1  . valACYclovir (VALTREX) 1000 MG tablet TAKE 2 TABLETS BY MOUTH  DAILY AS NEEDED (AT THE  ONSET OF SYMPTOMS AND TAKE  2 TABS 12 HOURS LATER) 90 tablet 1  . metoprolol succinate (TOPROL-XL) 25 MG 24 hr tablet Take 1 tablet by mouth  daily 90 tablet 3   No facility-administered medications prior to visit.      Allergies: Clarithromycin; Lisinopril; Nitrofurantoin; Oxycodone-acetaminophen; and Atorvastatin    PHYSICAL EXAM: VS:  BP 132/80 (BP Location: Right Arm, Patient Position: Sitting, Cuff Size: Normal)   Pulse (!) 101   Ht 5\' 4"  (1.626 m)   Wt 156 lb 12 oz (71.1 kg)   BMI 26.91 kg/m  , Body mass index is 26.91 kg/m. Wt Readings from Last 3 Encounters:  11/28/16 156 lb 12 oz (71.1 kg)  11/16/16 158 lb (71.7 kg)  07/10/16 155 lb (70.3 kg)    GENERAL:  well developed, well nourished, not in acute distress HEENT: normocephalic, pink conjunctivae, anicteric sclerae, no xanthelasma, normal dentition, oropharynx clear NECK:  no neck vein engorgement, JVP normal, no hepatojugular reflux, carotid upstroke brisk and symmetric, no bruit, no thyromegaly, no lymphadenopathy LUNGS:  good respiratory effort, clear to auscultation bilaterally CV:  PMI not displaced, no thrills, no lifts, S1 and S2 within normal limits, no palpable S3 or S4, no murmurs, no rubs, no gallops ABD:  Soft, nontender, nondistended, normoactive bowel sounds, no abdominal aortic bruit, no hepatomegaly, no splenomegaly MS: nontender back, no kyphosis, no scoliosis, no joint deformities EXT:  2+ DP/PT pulses, no edema, no varicosities, no cyanosis, no clubbing SKIN: warm, nondiaphoretic, normal turgor, no ulcers NEUROPSYCH: alert, oriented to person, place, and time, sensory/motor grossly intact, normal mood, appropriate affect  Recent Labs: 05/17/2016: ALT 35; BUN 14; Creatinine, Ser 1.07; Hemoglobin 13.7; Platelets 337.0; Potassium 4.5; Sodium 135   Lipid Panel    Component Value  Date/Time   CHOL 229 (H) 05/17/2016 1216   TRIG 347.0 (H) 05/17/2016 1216   HDL 48.70 05/17/2016 1216   CHOLHDL 5 05/17/2016 1216   VLDL 69.4 (H) 05/17/2016 1216   LDLCALC 63 10/29/2012 0942   LDLDIRECT 152.0 05/17/2016 1216     Other studies Reviewed:  EKG:  The ekg from01/07/2017 was personally reviewed by me and it revealed sinus rhythm, 102 bpm, sinus tachycardia.  Additional studies/ records that were reviewed personally reviewed by me today include: None available   ASSESSMENT AND PLAN: Chest pain, shortness breath Sinus tachycardia, palpitations Concerning for angina No ischemia and EKG. No current chest pain. Risk factors for CAD include hypertension, diabetes, significant family history. Recommend aspirin 81 mg by mouth daily, nitroglycerin sublingual when necessary for chest pain. Patient remembers not tolerating nitroglycerin before due to severe headache. Recommend instead  of Imdur to increase metoprolol succinate 25 mg to one and half tablets daily. Recommend echocardiogram. Recommend exercise nuclear stress test. Patient to call 911 for unrelenting chest pain.  Hypertension BP is well controlled. Continue monitoring BP. Continue current medical therapy and lifestyle changes.  Hyperlipidemia LDL is greater than 70, in the setting of diabetes. Would recommend for patient to rediscuss this with her PCP. Apparently, patient did not tolerate atorvastatin before. Recommend to retrial with another statin medication.    Current medicines are reviewed at length with the patient today.  The patient does not have concerns regarding medicines.  Labs/ tests ordered today include:  Orders Placed This Encounter  Procedures  . NM Myocar Multi W/Spect W/Wall Motion / EF  . EKG 12-Lead  . ECHOCARDIOGRAM COMPLETE    I had a lengthy and detailed discussion with the patient regarding diagnoses, prognosis, diagnostic options, treatment options , and side effects of  medications.   I counseled the patient on importance of lifestyle modification including heart healthy diet, regular physical activity Once cardiac workup is completed.   Disposition:   FU with undersigned after tests   Signed, Wende Bushy, MD  11/28/2016 3:31 PM    [place]  This note was generated in part with voice recognition software and I apologize for any typographical errors that were not detected and corrected.

## 2016-11-29 ENCOUNTER — Ambulatory Visit (INDEPENDENT_AMBULATORY_CARE_PROVIDER_SITE_OTHER): Payer: 59

## 2016-11-29 ENCOUNTER — Other Ambulatory Visit: Payer: Self-pay

## 2016-11-29 DIAGNOSIS — H401131 Primary open-angle glaucoma, bilateral, mild stage: Secondary | ICD-10-CM | POA: Diagnosis not present

## 2016-11-29 DIAGNOSIS — R000 Tachycardia, unspecified: Secondary | ICD-10-CM

## 2016-12-01 DIAGNOSIS — H401131 Primary open-angle glaucoma, bilateral, mild stage: Secondary | ICD-10-CM | POA: Diagnosis not present

## 2016-12-01 LAB — HM DIABETES EYE EXAM

## 2016-12-04 ENCOUNTER — Encounter
Admission: RE | Admit: 2016-12-04 | Discharge: 2016-12-04 | Disposition: A | Discharge status: Home / Self Care | Payer: 59 | Source: Ambulatory Visit | Attending: Cardiology | Admitting: Cardiology

## 2016-12-04 DIAGNOSIS — R000 Tachycardia, unspecified: Secondary | ICD-10-CM | POA: Insufficient documentation

## 2016-12-05 ENCOUNTER — Encounter
Admission: RE | Admit: 2016-12-05 | Discharge: 2016-12-05 | Disposition: A | Discharge status: Home / Self Care | Payer: 59 | Source: Ambulatory Visit | Attending: Cardiology | Admitting: Cardiology

## 2016-12-05 DIAGNOSIS — R000 Tachycardia, unspecified: Secondary | ICD-10-CM | POA: Insufficient documentation

## 2016-12-05 LAB — NM MYOCAR MULTI W/SPECT W/WALL MOTION / EF
CHL CUP NUCLEAR SRS: 1
CHL CUP NUCLEAR SSS: 2
CHL CUP STRESS STAGE 1 SPEED: 0 mph
CHL CUP STRESS STAGE 2 GRADE: 0 %
CHL CUP STRESS STAGE 2 HR: 109 {beats}/min
CHL CUP STRESS STAGE 2 SPEED: 0 mph
CHL CUP STRESS STAGE 3 DBP: 76 mmHg
CHL CUP STRESS STAGE 3 HR: 127 {beats}/min
CHL CUP STRESS STAGE 3 SBP: 164 mmHg
CHL CUP STRESS STAGE 4 DBP: 83 mmHg
CHL CUP STRESS STAGE 4 GRADE: 12 %
CHL CUP STRESS STAGE 5 SPEED: 2.5 mph
CHL CUP STRESS STAGE 6 GRADE: 0 %
CHL CUP STRESS STAGE 6 SPEED: 0 mph
CHL CUP STRESS STAGE 7 DBP: 75 mmHg
CHL CUP STRESS STAGE 7 GRADE: 0 %
CSEPPHR: 146 {beats}/min
CSEPPMHR: 90 %
Estimated workload: 7 METS
Exercise duration (min): 6 min
Exercise duration (sec): 0 s
LV dias vol: 59 mL (ref 46–106)
LVSYSVOL: 22 mL
Percent HR: 90 %
Rest HR: 81 {beats}/min
SDS: 0
Stage 1 Grade: 0 %
Stage 1 HR: 109 {beats}/min
Stage 3 Grade: 10 %
Stage 3 Speed: 1.7 mph
Stage 4 HR: 146 {beats}/min
Stage 4 SBP: 158 mmHg
Stage 4 Speed: 2.5 mph
Stage 5 Grade: 12 %
Stage 5 HR: 146 {beats}/min
Stage 6 HR: 114 {beats}/min
Stage 7 HR: 93 {beats}/min
Stage 7 SBP: 150 mmHg
Stage 7 Speed: 0 mph
TID: 1.03

## 2016-12-05 MED ORDER — TECHNETIUM TC 99M TETROFOSMIN IV KIT
33.0000 | PACK | Freq: Once | INTRAVENOUS | Status: AC | PRN
  Administered 2016-12-05: 32.029 via INTRAVENOUS

## 2016-12-05 MED ORDER — TECHNETIUM TC 99M TETROFOSMIN IV KIT
13.0000 | PACK | Freq: Once | INTRAVENOUS | Status: AC | PRN
  Administered 2016-12-05: 12.428 via INTRAVENOUS

## 2016-12-12 ENCOUNTER — Ambulatory Visit: Payer: 59 | Admitting: Cardiology

## 2016-12-12 ENCOUNTER — Encounter: Payer: Self-pay | Admitting: Internal Medicine

## 2016-12-14 ENCOUNTER — Telehealth: Payer: Self-pay | Admitting: Cardiology

## 2016-12-14 ENCOUNTER — Ambulatory Visit (INDEPENDENT_AMBULATORY_CARE_PROVIDER_SITE_OTHER): Payer: 59 | Admitting: Cardiology

## 2016-12-14 ENCOUNTER — Encounter: Payer: Self-pay | Admitting: Cardiology

## 2016-12-14 ENCOUNTER — Other Ambulatory Visit: Payer: Self-pay | Admitting: Cardiology

## 2016-12-14 VITALS — BP 120/78 | HR 87 | Ht 64.0 in | Wt 158.5 lb

## 2016-12-14 DIAGNOSIS — Z01810 Encounter for preprocedural cardiovascular examination: Secondary | ICD-10-CM

## 2016-12-14 DIAGNOSIS — R079 Chest pain, unspecified: Secondary | ICD-10-CM | POA: Diagnosis not present

## 2016-12-14 DIAGNOSIS — R000 Tachycardia, unspecified: Secondary | ICD-10-CM

## 2016-12-14 DIAGNOSIS — E7849 Other hyperlipidemia: Secondary | ICD-10-CM

## 2016-12-14 DIAGNOSIS — E784 Other hyperlipidemia: Secondary | ICD-10-CM

## 2016-12-14 DIAGNOSIS — I10 Essential (primary) hypertension: Secondary | ICD-10-CM

## 2016-12-14 DIAGNOSIS — R0789 Other chest pain: Secondary | ICD-10-CM

## 2016-12-14 NOTE — Patient Instructions (Signed)
Labwork: We will call you with results of these labs.   Testing/Procedures: [facility] Cardiac Cath Instructions   You are scheduled for a Cardiac Cath on:_________________________  Please arrive at _______am on the day of your procedure  Please expect a call from our [place] to pre-register you  Do not eat/drink anything after midnight  Someone will need to drive you home  It is recommended someone be with you for the first 24 hours after your procedure  Wear clothes that are easy to get on/off and wear slip on shoes if possible   Medications bring a current list of all medications with you  _X__ Do not take these medications before your procedure:_Metformin for 24 hours before your procedure and hold your Metoprolol the day of the procedure.   Day of your procedure: Arrive at the [facility] entrance.  Free valet service is available.  After entering the [facility] please check-in at the registration desk (1st desk on your right) to receive your armband. After receiving your armband someone will escort you to the cardiac cath/special procedures waiting area.  The usual length of stay after your procedure is about 2 to 3 hours.  This can vary.  If you have any questions, please call our office at [phone], or you may call the cardiac cath lab at [facility] directly at [phone]   Follow-Up: Your physician recommends that you schedule a follow-up appointment after procedure with Dr. Yvone Neu.   It was a pleasure seeing you today here in the office. Please do not hesitate to give [place] a call back if you have any further questions. [place], BSN

## 2016-12-14 NOTE — Telephone Encounter (Signed)
Patient calling back regarding possible procedure on Monday. Let her know that I was waiting on [facility] call back with time.

## 2016-12-14 NOTE — Telephone Encounter (Signed)
Spoke with patient and let her know that left heart cath would be on Monday 12/18/16 at 09:30AM and to arrive at 08:30AM to get registered. Reviewed instructions with her in detail and she verbalized understanding with no further questions at this time.

## 2016-12-14 NOTE — Progress Notes (Signed)
Cardiology Office Note   Date:  12/14/2016   ID:  Shannon [name], DOB December 27, 1955, MRN [id number]  Referring Doctor:  Viviana Simpler, MD   Cardiologist:   Wende Bushy, MD   Reason for consultation:  Chief Complaint  Patient presents with  . other    Follow up from [place] [facility]. Pt. still c/o tachycardia and chest pain.       History of Present Illness: Shannon [name] is a 61 y.o. female who presents for Follow-up after stress test.   Since last visit, she continues to have episodes of chest pain. She describes this as a sharp pain or discomfort in the center of the chest, usually with exertion. Symptoms are the same as before, may be a little bit less frequent since she started taking metoprolol. However she continues to be bothered him concerned by these episodes. She continues to have shortness breath with exertion. She does not think her symptoms are musculoskeletal in nature.   In terms of hypertension, blood pressures into be okay at home.   No PND, orthopnea, edema. No loss of consciousness.  ROS:  Please see the history of present illness. Aside from mentioned under HPI, all other systems are reviewed and negative.       Past Medical History:  Diagnosis Date  . Allergy   . Asthma   . Depression   . Diabetes mellitus 11/12  . GERD (gastroesophageal reflux disease)   . Glaucoma suspect   . Hyperlipidemia   . Hypertension   . Mitral valve prolapse   . Osteopenia   . Shingles 11/2006  . Sleep disturbance   . Tachycardia     Past Surgical History:  Procedure Laterality Date  . ABDOMINAL HYSTERECTOMY  2000   /BSO - abn. [facility]  . APPENDECTOMY  1978  . AUGMENTATION MAMMAPLASTY Bilateral 1981  . BLADDER REPAIR     x2  . BREAST ENHANCEMENT SURGERY  1987  . [place] DISCECTOMY  11/2009   with synthetic discs inserted--  Dr Arnoldo Morale  . NM MYOVIEW LTD  03/2005   EF 56%, no ischemia  . RECTAL PROLAPSE REPAIR     x1   . SHOULDER SURGERY  09/2008, 01/2009   Right shoulder surgery, then redone with lysis of adhesions     reports that she has never smoked. She has never used smokeless tobacco. She reports that she does not drink alcohol or use drugs.   family history includes Breast cancer in her maternal grandmother; Cancer in her father; Coronary artery disease in her father and sister; Depression in her sister; Diabetes in her maternal grandfather, maternal grandmother, mother, and sister; Heart attack in her cousin, paternal uncle, and paternal uncle; Heart disease in her father, mother, and sister; Hypertension in her mother; Melanoma in her mother; Ovarian cancer in her mother; Rheum arthritis in her maternal aunt.   Outpatient Medications Prior to Visit  Medication Sig Dispense Refill  . aspirin EC 81 MG tablet Take 1 tablet (81 mg total) by mouth daily. 90 tablet 3  . LUMIGAN 0.01 % SOLN     . metFORMIN (GLUCOPHAGE) 1000 MG tablet Take 1 tablet (1,000 mg total) by mouth 2 (two) times daily with a meal. 180 tablet 3  . metoprolol succinate (TOPROL-XL) 25 MG 24 hr tablet Take 1.5 tablets (37.5 mg total) by mouth daily. 45 tablet 6  . nitroGLYCERIN (NITROSTAT) 0.4 MG SL tablet Place 1  tablet (0.4 mg total) under the tongue every 5 (five) minutes as needed. 25 tablet 6  . ONE TOUCH ULTRA TEST test strip USE ONE STRIP TO CHECK GLUCOSE ONCE DAILY AND AS NEEDED 100 each 3  . ONETOUCH DELICA LANCETS FINE MISC Check blood sugar once daily and as needed. 250.00 100 each 1  . pantoprazole (PROTONIX) 40 MG tablet Take 2  tablet by mouth  daily 180 tablet 3  . sucralfate (CARAFATE) 1 g tablet TAKE ONE TABLET BY MOUTH THREE TIMES DAILY BEFORE MEALS 90 tablet 0  . traMADol (ULTRAM) 50 MG tablet TAKE ONE TABLET BY MOUTH TWICE DAILY AS NEEDED 60 tablet 0  . traZODone (DESYREL) 100 MG tablet Take 2 tablets by mouth  every night at bedtime 180 tablet 3  . triamterene-hydrochlorothiazide (MAXZIDE-25) 37.5-25 MG tablet  Take 1 tablet by mouth once a day 90 tablet 1  . valACYclovir (VALTREX) 1000 MG tablet TAKE 2 TABLETS BY MOUTH  DAILY AS NEEDED (AT THE  ONSET OF SYMPTOMS AND TAKE  2 TABS 12 HOURS LATER) 90 tablet 1   No facility-administered medications prior to visit.      Allergies: Clarithromycin; Lisinopril; Nitrofurantoin; Oxycodone-acetaminophen; and Atorvastatin    PHYSICAL EXAM: VS:  BP 120/78 (BP Location: Left Arm, Patient Position: Sitting, Cuff Size: Normal)   Pulse 87   Ht 5\' 4"  (1.626 m)   Wt 158 lb 8 oz (71.9 kg)   BMI 27.21 kg/m  , Body mass index is 27.21 kg/m. Wt Readings from Last 3 Encounters:  12/14/16 158 lb 8 oz (71.9 kg)  11/28/16 156 lb 12 oz (71.1 kg)  11/16/16 158 lb (71.7 kg)    GENERAL:  well developed, well nourished, not in acute distress HEENT: normocephalic, pink conjunctivae, anicteric sclerae, no xanthelasma, normal dentition, oropharynx clear NECK:  no neck vein engorgement, JVP normal, no hepatojugular reflux, carotid upstroke brisk and symmetric, no bruit, no thyromegaly, no lymphadenopathy LUNGS:  good respiratory effort, clear to auscultation bilaterally CV:  PMI not displaced, no thrills, no lifts, S1 and S2 within normal limits, no palpable S3 or S4, no murmurs, no rubs, no gallops ABD:  Soft, nontender, nondistended, normoactive bowel sounds, no abdominal aortic bruit, no hepatomegaly, no splenomegaly MS: nontender back, no kyphosis, no scoliosis, no joint deformities EXT:  2+ DP/PT pulses, no edema, no varicosities, no cyanosis, no clubbing SKIN: warm, nondiaphoretic, normal turgor, no ulcers NEUROPSYCH: alert, oriented to person, place, and time, sensory/motor grossly intact, normal mood, appropriate affect  Recent Labs: 05/17/2016: ALT 35; BUN 14; Creatinine, Ser 1.07; Hemoglobin 13.7; Platelets 337.0; Potassium 4.5; Sodium 135   Lipid Panel    Component Value Date/Time   CHOL 229 (H) 05/17/2016 1216   TRIG 347.0 (H) 05/17/2016 1216   HDL 48.70  05/17/2016 1216   CHOLHDL 5 05/17/2016 1216   VLDL 69.4 (H) 05/17/2016 1216   LDLCALC 63 10/29/2012 0942   LDLDIRECT 152.0 05/17/2016 1216     Other studies Reviewed:  EKG:  The ekg from01/07/2017 was personally reviewed by me and it revealed sinus rhythm, 102 bpm, sinus tachycardia.  Additional studies/ records that were reviewed personally reviewed by me today include:  Echo 11/29/2016: Left ventricle: The cavity size was normal. Wall thickness was   increased in a pattern of mild LVH. Systolic function was normal.   The estimated ejection fraction was in the range of 55% to 60%.   Wall motion was normal; there were no regional wall motion   abnormalities. Doppler  parameters are consistent with abnormal   left ventricular relaxation (grade 1 diastolic dysfunction). - Mitral valve: Systolic bowing without prolapse. There was trivial   regurgitation. - Right ventricle: The cavity size was normal. Wall thickness was   normal. Systolic function was normal.  Nuclear stress test 12/05/2016: Exercise myocardial perfusion imaging study with no significant ischemia Normal wall motion, EF estimated at 66% No EKG changes concerning for ischemia at peak stress or in recovery. Target heart rate achieved Low risk scan    ASSESSMENT AND PLAN: Chest pain, shortness breath Sinus tachycardia, palpitations Concerning for angina Per last office visit 11/28/2016, Risk factors for CAD include hypertension, diabetes, significant family history. When over results of stress test and echocardiogram. No evidence of ischemia on stress test. However, she continues to have chest pain with exertion. With her risk factors including premature CAD in the family, diabetes, uncontrolled, we went over options of CTA of the coronaries versus left heart catheterization. She will like to proceed with more definitive study which is left heart catheterization. Risks and benefits of cardiac catheterization have been  discussed with the patient.  These include less than 1% chance of major complications including but not limited to bleeding, infection, kidney damage, arrhyhthmia, heart attack, stroke, and death.  The patient understands these risks and is willing to proceed. Continue aspirin 81 daily. She cannot tolerate nitroglycerin due to severe headache. Continue medical therapy.  Patient to call 911 for unrelenting chest pain.  Hypertension BP is well controlled. Continue monitoring BP. Continue current medical therapy and lifestyle changes.  Hyperlipidemia We will start pravastatin 40 mg by mouth daily at bedtime. Check lipid panel today.   Current medicines are reviewed at length with the patient today.  The patient does not have concerns regarding medicines.  Labs/ tests ordered today include:  Orders Placed This Encounter  Procedures  . EKG 12-Lead    Disposition:   FU with undersigned after cath Signed, Wende Bushy, MD  12/14/2016 11:03 AM    [place]  This note was generated in part with voice recognition software and I apologize for any typographical errors that were not detected and corrected.

## 2016-12-14 NOTE — Telephone Encounter (Signed)
Left voicemail message to call back to schedule left heart catheterization for possibly on Monday.

## 2016-12-15 ENCOUNTER — Ambulatory Visit
Admission: RE | Admit: 2016-12-15 | Discharge: 2016-12-15 | Disposition: A | Discharge status: Home / Self Care | Payer: 59 | Source: Ambulatory Visit | Attending: Cardiology | Admitting: Cardiology

## 2016-12-15 ENCOUNTER — Telehealth: Payer: Self-pay | Admitting: Cardiology

## 2016-12-15 DIAGNOSIS — Z01810 Encounter for preprocedural cardiovascular examination: Secondary | ICD-10-CM | POA: Diagnosis not present

## 2016-12-15 DIAGNOSIS — Z833 Family history of diabetes mellitus: Secondary | ICD-10-CM | POA: Insufficient documentation

## 2016-12-15 DIAGNOSIS — Z79899 Other long term (current) drug therapy: Secondary | ICD-10-CM | POA: Insufficient documentation

## 2016-12-15 DIAGNOSIS — R000 Tachycardia, unspecified: Secondary | ICD-10-CM | POA: Diagnosis not present

## 2016-12-15 DIAGNOSIS — Z8249 Family history of ischemic heart disease and other diseases of the circulatory system: Secondary | ICD-10-CM | POA: Insufficient documentation

## 2016-12-15 DIAGNOSIS — I10 Essential (primary) hypertension: Secondary | ICD-10-CM | POA: Insufficient documentation

## 2016-12-15 DIAGNOSIS — E785 Hyperlipidemia, unspecified: Secondary | ICD-10-CM | POA: Diagnosis not present

## 2016-12-15 DIAGNOSIS — R002 Palpitations: Secondary | ICD-10-CM | POA: Diagnosis not present

## 2016-12-15 DIAGNOSIS — R079 Chest pain, unspecified: Secondary | ICD-10-CM | POA: Diagnosis not present

## 2016-12-15 LAB — PROTIME-INR
INR: 1 (ref 0.8–1.2)
Prothrombin Time: 10.4 s (ref 9.1–12.0)

## 2016-12-15 LAB — CBC WITH DIFFERENTIAL/PLATELET
BASOS: 0 %
Basophils Absolute: 0 10*3/uL (ref 0.0–0.2)
EOS (ABSOLUTE): 0.3 10*3/uL (ref 0.0–0.4)
EOS: 4 %
HEMOGLOBIN: 13.2 g/dL (ref 11.1–15.9)
Hematocrit: 39.3 % (ref 34.0–46.6)
IMMATURE GRANS (ABS): 0 10*3/uL (ref 0.0–0.1)
Immature Granulocytes: 0 %
Lymphocytes Absolute: 2.6 10*3/uL (ref 0.7–3.1)
Lymphs: 34 %
MCH: 28.4 pg (ref 26.6–33.0)
MCHC: 33.6 g/dL (ref 31.5–35.7)
MCV: 85 fL (ref 79–97)
MONOCYTES: 9 %
Monocytes Absolute: 0.7 10*3/uL (ref 0.1–0.9)
NEUTROS ABS: 4.2 10*3/uL (ref 1.4–7.0)
Neutrophils: 53 %
Platelets: 289 10*3/uL (ref 150–379)
RBC: 4.65 x10E6/uL (ref 3.77–5.28)
RDW: 14 % (ref 12.3–15.4)
WBC: 7.8 10*3/uL (ref 3.4–10.8)

## 2016-12-15 LAB — COMPREHENSIVE METABOLIC PANEL
A/G RATIO: 1.7 (ref 1.2–2.2)
ALBUMIN: 4.7 g/dL (ref 3.5–5.5)
ALT: 33 IU/L — ABNORMAL HIGH (ref 0–32)
AST: 18 IU/L (ref 0–40)
Alkaline Phosphatase: 94 IU/L (ref 39–117)
BUN / CREAT RATIO: 18 (ref 9–23)
BUN: 16 mg/dL (ref 6–24)
CHLORIDE: 97 mmol/L (ref 96–106)
CO2: 24 mmol/L (ref 18–29)
Calcium: 9.7 mg/dL (ref 8.7–10.2)
Creatinine, Ser: 0.87 mg/dL (ref 0.57–1.00)
GFR, EST AFRICAN AMERICAN: 85 mL/min/{1.73_m2} (ref >59)
GFR, EST NON AFRICAN AMERICAN: 74 mL/min/{1.73_m2} (ref >59)
Globulin, Total: 2.7 g/dL (ref 1.5–4.5)
Glucose: 209 mg/dL — ABNORMAL HIGH (ref 65–99)
Potassium: 4.1 mmol/L (ref 3.5–5.2)
Sodium: 140 mmol/L (ref 134–144)
TOTAL PROTEIN: 7.4 g/dL (ref 6.0–8.5)

## 2016-12-15 LAB — LIPID PANEL
CHOL/HDL RATIO: 3.9 ratio (ref 0.0–4.4)
Cholesterol, Total: 231 mg/dL — ABNORMAL HIGH (ref 100–199)
HDL: 59 mg/dL (ref >39)
LDL CALC: 126 mg/dL — AB (ref 0–99)
TRIGLYCERIDES: 228 mg/dL — AB (ref 0–149)
VLDL CHOLESTEROL CAL: 46 mg/dL — AB (ref 5–40)

## 2016-12-15 NOTE — Telephone Encounter (Signed)
Patient wants to know who is doing her heart cath tomorrow. Also, her Rx wasn't called into [facility] on [address]. (Patient not sure the name of the medication).

## 2016-12-15 NOTE — Telephone Encounter (Signed)
S/w patient. Patient made aware that Dr Fletcher Anon is doing heart cath on Monday.  Patient also asked about new medication for cholesterol that she was told she would start yesterday at office visit. Medication was not on AVS but Dr Yvone Neu did state "We will start pravastatin 40 mg by mouth daily at bedtime." in her office note. Patient had lipid panel labs yesterday.  Will route to Dr Yvone Neu for advice concerning new medication.

## 2016-12-16 ENCOUNTER — Other Ambulatory Visit: Payer: Self-pay | Admitting: Internal Medicine

## 2016-12-18 ENCOUNTER — Ambulatory Visit
Admission: RE | Admit: 2016-12-18 | Discharge: 2016-12-18 | Disposition: A | Discharge status: Home / Self Care | Payer: 59 | Source: Ambulatory Visit | Attending: Cardiovascular Disease | Admitting: Cardiovascular Disease

## 2016-12-18 ENCOUNTER — Encounter: Payer: Self-pay | Admitting: *Deleted

## 2016-12-18 ENCOUNTER — Encounter
Admission: RE | Disposition: A | Discharge status: Home / Self Care | Payer: Self-pay | Source: Ambulatory Visit | Attending: Cardiovascular Disease

## 2016-12-18 DIAGNOSIS — Z808 Family history of malignant neoplasm of other organs or systems: Secondary | ICD-10-CM | POA: Insufficient documentation

## 2016-12-18 DIAGNOSIS — Z885 Allergy status to narcotic agent status: Secondary | ICD-10-CM | POA: Insufficient documentation

## 2016-12-18 DIAGNOSIS — Z79899 Other long term (current) drug therapy: Secondary | ICD-10-CM | POA: Insufficient documentation

## 2016-12-18 DIAGNOSIS — I341 Nonrheumatic mitral (valve) prolapse: Secondary | ICD-10-CM | POA: Insufficient documentation

## 2016-12-18 DIAGNOSIS — Z8041 Family history of malignant neoplasm of ovary: Secondary | ICD-10-CM | POA: Insufficient documentation

## 2016-12-18 DIAGNOSIS — Z8261 Family history of arthritis: Secondary | ICD-10-CM | POA: Insufficient documentation

## 2016-12-18 DIAGNOSIS — Z7984 Long term (current) use of oral hypoglycemic drugs: Secondary | ICD-10-CM | POA: Insufficient documentation

## 2016-12-18 DIAGNOSIS — Z8249 Family history of ischemic heart disease and other diseases of the circulatory system: Secondary | ICD-10-CM | POA: Insufficient documentation

## 2016-12-18 DIAGNOSIS — Z8619 Personal history of other infectious and parasitic diseases: Secondary | ICD-10-CM | POA: Insufficient documentation

## 2016-12-18 DIAGNOSIS — F329 Major depressive disorder, single episode, unspecified: Secondary | ICD-10-CM | POA: Insufficient documentation

## 2016-12-18 DIAGNOSIS — Z833 Family history of diabetes mellitus: Secondary | ICD-10-CM | POA: Insufficient documentation

## 2016-12-18 DIAGNOSIS — Z7982 Long term (current) use of aspirin: Secondary | ICD-10-CM | POA: Diagnosis not present

## 2016-12-18 DIAGNOSIS — Z888 Allergy status to other drugs, medicaments and biological substances status: Secondary | ICD-10-CM | POA: Insufficient documentation

## 2016-12-18 DIAGNOSIS — I10 Essential (primary) hypertension: Secondary | ICD-10-CM | POA: Diagnosis not present

## 2016-12-18 DIAGNOSIS — R000 Tachycardia, unspecified: Secondary | ICD-10-CM | POA: Insufficient documentation

## 2016-12-18 DIAGNOSIS — Z803 Family history of malignant neoplasm of breast: Secondary | ICD-10-CM | POA: Insufficient documentation

## 2016-12-18 DIAGNOSIS — Z90710 Acquired absence of both cervix and uterus: Secondary | ICD-10-CM | POA: Insufficient documentation

## 2016-12-18 DIAGNOSIS — K219 Gastro-esophageal reflux disease without esophagitis: Secondary | ICD-10-CM | POA: Diagnosis not present

## 2016-12-18 DIAGNOSIS — R079 Chest pain, unspecified: Secondary | ICD-10-CM | POA: Diagnosis present

## 2016-12-18 DIAGNOSIS — R0789 Other chest pain: Secondary | ICD-10-CM | POA: Diagnosis not present

## 2016-12-18 DIAGNOSIS — Z818 Family history of other mental and behavioral disorders: Secondary | ICD-10-CM | POA: Insufficient documentation

## 2016-12-18 DIAGNOSIS — G479 Sleep disorder, unspecified: Secondary | ICD-10-CM | POA: Diagnosis not present

## 2016-12-18 DIAGNOSIS — E119 Type 2 diabetes mellitus without complications: Secondary | ICD-10-CM | POA: Diagnosis not present

## 2016-12-18 DIAGNOSIS — E785 Hyperlipidemia, unspecified: Secondary | ICD-10-CM | POA: Insufficient documentation

## 2016-12-18 DIAGNOSIS — J45909 Unspecified asthma, uncomplicated: Secondary | ICD-10-CM | POA: Diagnosis not present

## 2016-12-18 DIAGNOSIS — Z881 Allergy status to other antibiotic agents status: Secondary | ICD-10-CM | POA: Insufficient documentation

## 2016-12-18 DIAGNOSIS — M8580 Other specified disorders of bone density and structure, unspecified site: Secondary | ICD-10-CM | POA: Insufficient documentation

## 2016-12-18 HISTORY — PX: CARDIAC CATHETERIZATION: SHX172

## 2016-12-18 LAB — GLUCOSE, CAPILLARY: Glucose-Capillary: 169 mg/dL — ABNORMAL HIGH (ref 65–99)

## 2016-12-18 SURGERY — LEFT HEART CATH AND CORONARY ANGIOGRAPHY
Anesthesia: Moderate Sedation | Laterality: Left

## 2016-12-18 MED ORDER — FENTANYL CITRATE (PF) 100 MCG/2ML IJ SOLN
INTRAMUSCULAR | Status: AC
  Filled 2016-12-18: qty 2

## 2016-12-18 MED ORDER — VERAPAMIL HCL 2.5 MG/ML IV SOLN
INTRAVENOUS | Status: AC
  Filled 2016-12-18: qty 2

## 2016-12-18 MED ORDER — MIDAZOLAM HCL 2 MG/2ML IJ SOLN
INTRAMUSCULAR | Status: DC | PRN
  Administered 2016-12-18: 1 mg via INTRAVENOUS

## 2016-12-18 MED ORDER — HEPARIN (PORCINE) IN NACL 2-0.9 UNIT/ML-% IJ SOLN
INTRAMUSCULAR | Status: AC
  Filled 2016-12-18: qty 1000

## 2016-12-18 MED ORDER — HEPARIN SODIUM (PORCINE) 1000 UNIT/ML IJ SOLN
INTRAMUSCULAR | Status: DC | PRN
  Administered 2016-12-18: 4000 [IU] via INTRAVENOUS

## 2016-12-18 MED ORDER — MIDAZOLAM HCL 2 MG/2ML IJ SOLN
INTRAMUSCULAR | Status: AC
  Filled 2016-12-18: qty 2

## 2016-12-18 MED ORDER — FENTANYL CITRATE (PF) 100 MCG/2ML IJ SOLN
50.0000 ug | Freq: Once | INTRAMUSCULAR | Status: AC
  Administered 2016-12-18: 50 ug via INTRAVENOUS

## 2016-12-18 MED ORDER — HEPARIN SODIUM (PORCINE) 1000 UNIT/ML IJ SOLN
INTRAMUSCULAR | Status: AC
  Filled 2016-12-18: qty 1

## 2016-12-18 SURGICAL SUPPLY — 6 items
CATH OPTITORQUE JACKY 4.0 5F (CATHETERS) ×1 IMPLANT
DEVICE RAD TR BAND REGULAR (VASCULAR PRODUCTS) ×1 IMPLANT
GLIDESHEATH SLEND SS 6F .021 (SHEATH) ×1 IMPLANT
KIT MANI 3VAL PERCEP (MISCELLANEOUS) ×2 IMPLANT
PACK CARDIAC CATH (CUSTOM PROCEDURE TRAY) ×2 IMPLANT
WIRE ROSEN-J .035X260CM (WIRE) ×1 IMPLANT

## 2016-12-18 NOTE — H&P (View-Only) (Signed)
Cardiology Office Note   Date:  12/14/2016   ID:  Shannon [name], DOB 03-30-1956, MRN [id number]  Referring Doctor:  Viviana Simpler, MD   Cardiologist:   Wende Bushy, MD   Reason for consultation:  Chief Complaint  Patient presents with  . other    Follow up from [facility]. Pt. still c/o tachycardia and chest pain.       History of Present Illness: Shannon [name] is a 61 y.o. female who presents for Follow-up after stress test.   Since last visit, she continues to have episodes of chest pain. She describes this as a sharp pain or discomfort in the center of the chest, usually with exertion. Symptoms are the same as before, may be a little bit less frequent since she started taking metoprolol. However she continues to be bothered him concerned by these episodes. She continues to have shortness breath with exertion. She does not think her symptoms are musculoskeletal in nature.   In terms of hypertension, blood pressures into be okay at home.   No PND, orthopnea, edema. No loss of consciousness.  ROS:  Please see the history of present illness. Aside from mentioned under HPI, all other systems are reviewed and negative.       Past Medical History:  Diagnosis Date  . Allergy   . Asthma   . Depression   . Diabetes mellitus 11/12  . GERD (gastroesophageal reflux disease)   . Glaucoma suspect   . Hyperlipidemia   . Hypertension   . Mitral valve prolapse   . Osteopenia   . Shingles 11/2006  . Sleep disturbance   . Tachycardia     Past Surgical History:  Procedure Laterality Date  . ABDOMINAL HYSTERECTOMY  2000   /BSO - abn. [facility]  . APPENDECTOMY  1978  . AUGMENTATION MAMMAPLASTY Bilateral 1981  . BLADDER REPAIR     x2  . BREAST ENHANCEMENT SURGERY  1987  . [place] DISCECTOMY  11/2009   with synthetic discs inserted--  Dr Arnoldo Morale  . NM MYOVIEW LTD  03/2005   EF 56%, no ischemia  . RECTAL PROLAPSE REPAIR     x1   . SHOULDER SURGERY  09/2008, 01/2009   Right shoulder surgery, then redone with lysis of adhesions     reports that she has never smoked. She has never used smokeless tobacco. She reports that she does not drink alcohol or use drugs.   family history includes Breast cancer in her maternal grandmother; Cancer in her father; Coronary artery disease in her father and sister; Depression in her sister; Diabetes in her maternal grandfather, maternal grandmother, mother, and sister; Heart attack in her cousin, paternal uncle, and paternal uncle; Heart disease in her father, mother, and sister; Hypertension in her mother; Melanoma in her mother; Ovarian cancer in her mother; Rheum arthritis in her maternal aunt.   Outpatient Medications Prior to Visit  Medication Sig Dispense Refill  . aspirin EC 81 MG tablet Take 1 tablet (81 mg total) by mouth daily. 90 tablet 3  . LUMIGAN 0.01 % SOLN     . metFORMIN (GLUCOPHAGE) 1000 MG tablet Take 1 tablet (1,000 mg total) by mouth 2 (two) times daily with a meal. 180 tablet 3  . metoprolol succinate (TOPROL-XL) 25 MG 24 hr tablet Take 1.5 tablets (37.5 mg total) by mouth daily. 45 tablet 6  . nitroGLYCERIN (NITROSTAT) 0.4 MG SL tablet Place 1  tablet (0.4 mg total) under the tongue every 5 (five) minutes as needed. 25 tablet 6  . ONE TOUCH ULTRA TEST test strip USE ONE STRIP TO CHECK GLUCOSE ONCE DAILY AND AS NEEDED 100 each 3  . ONETOUCH DELICA LANCETS FINE MISC Check blood sugar once daily and as needed. 250.00 100 each 1  . pantoprazole (PROTONIX) 40 MG tablet Take 2  tablet by mouth  daily 180 tablet 3  . sucralfate (CARAFATE) 1 g tablet TAKE ONE TABLET BY MOUTH THREE TIMES DAILY BEFORE MEALS 90 tablet 0  . traMADol (ULTRAM) 50 MG tablet TAKE ONE TABLET BY MOUTH TWICE DAILY AS NEEDED 60 tablet 0  . traZODone (DESYREL) 100 MG tablet Take 2 tablets by mouth  every night at bedtime 180 tablet 3  . triamterene-hydrochlorothiazide (MAXZIDE-25) 37.5-25 MG tablet  Take 1 tablet by mouth once a day 90 tablet 1  . valACYclovir (VALTREX) 1000 MG tablet TAKE 2 TABLETS BY MOUTH  DAILY AS NEEDED (AT THE  ONSET OF SYMPTOMS AND TAKE  2 TABS 12 HOURS LATER) 90 tablet 1   No facility-administered medications prior to visit.      Allergies: Clarithromycin; Lisinopril; Nitrofurantoin; Oxycodone-acetaminophen; and Atorvastatin    PHYSICAL EXAM: VS:  BP 120/78 (BP Location: Left Arm, Patient Position: Sitting, Cuff Size: Normal)   Pulse 87   Ht 5\' 4"  (1.626 m)   Wt 158 lb 8 oz (71.9 kg)   BMI 27.21 kg/m  , Body mass index is 27.21 kg/m. Wt Readings from Last 3 Encounters:  12/14/16 158 lb 8 oz (71.9 kg)  11/28/16 156 lb 12 oz (71.1 kg)  11/16/16 158 lb (71.7 kg)    GENERAL:  well developed, well nourished, not in acute distress HEENT: normocephalic, pink conjunctivae, anicteric sclerae, no xanthelasma, normal dentition, oropharynx clear NECK:  no neck vein engorgement, JVP normal, no hepatojugular reflux, carotid upstroke brisk and symmetric, no bruit, no thyromegaly, no lymphadenopathy LUNGS:  good respiratory effort, clear to auscultation bilaterally CV:  PMI not displaced, no thrills, no lifts, S1 and S2 within normal limits, no palpable S3 or S4, no murmurs, no rubs, no gallops ABD:  Soft, nontender, nondistended, normoactive bowel sounds, no abdominal aortic bruit, no hepatomegaly, no splenomegaly MS: nontender back, no kyphosis, no scoliosis, no joint deformities EXT:  2+ DP/PT pulses, no edema, no varicosities, no cyanosis, no clubbing SKIN: warm, nondiaphoretic, normal turgor, no ulcers NEUROPSYCH: alert, oriented to person, place, and time, sensory/motor grossly intact, normal mood, appropriate affect  Recent Labs: 05/17/2016: ALT 35; BUN 14; Creatinine, Ser 1.07; Hemoglobin 13.7; Platelets 337.0; Potassium 4.5; Sodium 135   Lipid Panel    Component Value Date/Time   CHOL 229 (H) 05/17/2016 1216   TRIG 347.0 (H) 05/17/2016 1216   HDL 48.70  05/17/2016 1216   CHOLHDL 5 05/17/2016 1216   VLDL 69.4 (H) 05/17/2016 1216   LDLCALC 63 10/29/2012 0942   LDLDIRECT 152.0 05/17/2016 1216     Other studies Reviewed:  EKG:  The ekg from01/07/2017 was personally reviewed by me and it revealed sinus rhythm, 102 bpm, sinus tachycardia.  Additional studies/ records that were reviewed personally reviewed by me today include:  Echo 11/29/2016: Left ventricle: The cavity size was normal. Wall thickness was   increased in a pattern of mild LVH. Systolic function was normal.   The estimated ejection fraction was in the range of 55% to 60%.   Wall motion was normal; there were no regional wall motion   abnormalities. Doppler  parameters are consistent with abnormal   left ventricular relaxation (grade 1 diastolic dysfunction). - Mitral valve: Systolic bowing without prolapse. There was trivial   regurgitation. - Right ventricle: The cavity size was normal. Wall thickness was   normal. Systolic function was normal.  Nuclear stress test 12/05/2016: Exercise myocardial perfusion imaging study with no significant ischemia Normal wall motion, EF estimated at 66% No EKG changes concerning for ischemia at peak stress or in recovery. Target heart rate achieved Low risk scan    ASSESSMENT AND PLAN: Chest pain, shortness breath Sinus tachycardia, palpitations Concerning for angina Per last office visit 11/28/2016, Risk factors for CAD include hypertension, diabetes, significant family history. When over results of stress test and echocardiogram. No evidence of ischemia on stress test. However, she continues to have chest pain with exertion. With her risk factors including premature CAD in the family, diabetes, uncontrolled, we went over options of CTA of the coronaries versus left heart catheterization. She will like to proceed with more definitive study which is left heart catheterization. Risks and benefits of cardiac catheterization have been  discussed with the patient.  These include less than 1% chance of major complications including but not limited to bleeding, infection, kidney damage, arrhyhthmia, heart attack, stroke, and death.  The patient understands these risks and is willing to proceed. Continue aspirin 81 daily. She cannot tolerate nitroglycerin due to severe headache. Continue medical therapy.  Patient to call 911 for unrelenting chest pain.  Hypertension BP is well controlled. Continue monitoring BP. Continue current medical therapy and lifestyle changes.  Hyperlipidemia We will start pravastatin 40 mg by mouth daily at bedtime. Check lipid panel today.   Current medicines are reviewed at length with the patient today.  The patient does not have concerns regarding medicines.  Labs/ tests ordered today include:  Orders Placed This Encounter  Procedures  . EKG 12-Lead    Disposition:   FU with undersigned after cath Signed, Wende Bushy, MD  12/14/2016 11:03 AM    [place]  This note was generated in part with voice recognition software and I apologize for any typographical errors that were not detected and corrected.

## 2016-12-18 NOTE — Discharge Instructions (Signed)
Resume Metformin after 2 days. Keep right wrist elevated on pillow above the heart for today.  Watch right wrist for evidence of bleeding or hematoma.. If bleeding or hematoma noted, hold pressure over the site for at least 15 minutes and notify the physician.  No blending or flexing of the wrist--no lifting for the remainder of the day or for 2 weeks after your procedure. Notify the physician for evidence of infection or if you get a temperature. The drugs you were given will stay in your system until tomorrow, so for the next 24 hours you should not.  Drive an automobile. Make any legal decisions.  Drink any alcoholic beverages.  Today you should start with liquids and gradually work up to solid foods as your are able to tolerate them  Resume your regular medications as prescribed by your doctor.  Avoid aspirin for 24 hours.    Change the Band-Aid or dressing as needed.  After a 2 days no dressing as needed.  Avoid strenuous activity for the remainder of the day.  Please notify your primary physician immediately if you have any unusual bleeding, trouble breathing, fever >100 degrees or pain not relieved by the medication your doctor prescribed for your doctor prescribed for you physician

## 2016-12-18 NOTE — Interval H&P Note (Signed)
Cath Lab Visit (complete for each Cath Lab visit)  Clinical Evaluation Leading to the Procedure:   ACS: No.  Non-ACS:    Anginal Classification: CCS III  Anti-ischemic medical therapy: Minimal Therapy (1 class of medications)  Non-Invasive Test Results: Low-risk stress test findings: cardiac mortality <1%/year  Prior CABG: No previous CABG      History and Physical Interval Note:  12/18/2016 9:59 AM  Shannon [name]  has presented today for surgery, with the diagnosis of LT CATH   Chest pain  The various methods of treatment have been discussed with the patient and family. After consideration of risks, benefits and other options for treatment, the patient has consented to  Procedure(s): Left Heart Cath and Coronary Angiography (Left) as a surgical intervention .  The patient's history has been reviewed, patient examined, no change in status, stable for surgery.  I have reviewed the patient's chart and labs.  Questions were answered to the patient's satisfaction.     Kathlyn [place]

## 2016-12-19 MED ORDER — PRAVASTATIN SODIUM 40 MG PO TABS: 40.0000 mg | ORAL_TABLET | Freq: Every evening | ORAL | 6 refills | Status: DC

## 2016-12-19 NOTE — Telephone Encounter (Signed)
We can send in Pravastatin 40mg  po qhs

## 2016-12-19 NOTE — Telephone Encounter (Signed)
Spoke with patient and reviewed lab results and recommendations. Let her know that I would be sending in prescription for the pravastatin 40 mg once daily at bedtime. She verbalized understanding of our conversation, agreement with plan of care and had no further questions at this time.

## 2016-12-30 ENCOUNTER — Other Ambulatory Visit: Payer: Self-pay | Admitting: Internal Medicine

## 2017-01-02 ENCOUNTER — Encounter: Payer: Self-pay | Admitting: Cardiology

## 2017-01-02 ENCOUNTER — Ambulatory Visit (INDEPENDENT_AMBULATORY_CARE_PROVIDER_SITE_OTHER): Payer: 59 | Admitting: Cardiology

## 2017-01-02 ENCOUNTER — Other Ambulatory Visit: Payer: Self-pay | Admitting: Internal Medicine

## 2017-01-02 VITALS — BP 122/78 | HR 81 | Ht 64.0 in | Wt 156.5 lb

## 2017-01-02 DIAGNOSIS — E784 Other hyperlipidemia: Secondary | ICD-10-CM | POA: Diagnosis not present

## 2017-01-02 DIAGNOSIS — I10 Essential (primary) hypertension: Secondary | ICD-10-CM

## 2017-01-02 DIAGNOSIS — R079 Chest pain, unspecified: Secondary | ICD-10-CM | POA: Diagnosis not present

## 2017-01-02 DIAGNOSIS — E7849 Other hyperlipidemia: Secondary | ICD-10-CM

## 2017-01-02 NOTE — Progress Notes (Signed)
Cardiology Office Note   Date:  01/02/2017   ID:  Shannon [name], DOB [birth year], MRN [id number]  Referring Doctor:  Shannon Simpler, MD   Cardiologist:   Shannon Bushy, MD   Reason for consultation:  Chief Complaint  Patient presents with  . other    Follow up from [facility], echo and cardiac cath. Pt. still c/o chest pain. Meds reviewed by the pt. verbally.       History of Present Illness: Shannon [name] is a 61 y.o. female who presents for Follow-up After heart cath.  Since last visit, patient has episodes of short duration of chest pain. Not all of them are exertional in nature.  In terms of hypertension, blood pressure within normal limits at home.  In terms of hyperlipidemia, patient started taking pravastatin at bedtime.  Patient denies PND, orthopnea, edema. No palpitations, abdominal pain. No shortness of breath no loss of consciousness.  ROS:  Please see the history of present illness. Aside from mentioned under HPI, all other systems are reviewed and negative.    Past Medical History:  Diagnosis Date  . Allergy   . Asthma   . Depression   . Diabetes mellitus 11/12  . GERD (gastroesophageal reflux disease)   . Glaucoma suspect   . Hyperlipidemia   . Hypertension   . Mitral valve prolapse   . Osteopenia   . Shingles 11/2006  . Sleep disturbance   . Tachycardia     Past Surgical History:  Procedure Laterality Date  . ABDOMINAL HYSTERECTOMY  2000   /BSO - abn. Paps  . APPENDECTOMY  1978  . AUGMENTATION MAMMAPLASTY Bilateral 1981  . BLADDER REPAIR     x2  . BREAST ENHANCEMENT SURGERY  1987  . [place] Left 12/18/2016   Procedure: Left Heart Cath and Coronary Angiography;  Surgeon: Shannon Hampshire, MD;  Location: [facility];  Service: Cardiovascular;  Laterality: Left;  . CERVICAL DISCECTOMY  11/2009   with synthetic discs inserted--  Dr Shannon [name]  . NM MYOVIEW LTD  03/2005   EF 56%, no  ischemia  . RECTAL PROLAPSE REPAIR     x1  . SHOULDER SURGERY  09/2008, 01/2009   Right shoulder surgery, then redone with lysis of adhesions     reports that she has never smoked. She has never used smokeless tobacco. She reports that she does not drink alcohol or use drugs.   family history includes Breast cancer in her maternal grandmother; Cancer in her father; Coronary artery disease in her father and sister; Depression in her sister; Diabetes in her maternal grandfather, maternal grandmother, mother, and sister; Heart attack in her cousin, paternal uncle, and paternal uncle; Heart disease in her father, mother, and sister; Hypertension in her mother; Melanoma in her mother; Ovarian cancer in her mother; Rheum arthritis in her maternal aunt.   Outpatient Medications Prior to Visit  Medication Sig Dispense Refill  . aspirin EC 81 MG tablet Take 1 tablet (81 mg total) by mouth daily. 90 tablet 3  . aspirin-acetaminophen-caffeine (EXCEDRIN MIGRAINE) 250-250-65 MG tablet Take 1 tablet by mouth every 6 (six) hours as needed for headache.    . cholecalciferol (VITAMIN D) 1000 units tablet Take 1,000 Units by mouth daily.    Marland Kitchen LUMIGAN 0.01 % SOLN Place 1 drop into both eyes at bedtime.     . metFORMIN (GLUCOPHAGE) 1000 MG tablet Take 1 tablet (1,000 mg total)  by mouth 2 (two) times daily with a meal. 180 tablet 3  . metoprolol succinate (TOPROL-XL) 25 MG 24 hr tablet Take 1.5 tablets (37.5 mg total) by mouth daily. 45 tablet 6  . Multiple Vitamins-Minerals (MULTIVITAMIN GUMMIES ADULT) CHEW Chew 2 each by mouth daily.    . nitroGLYCERIN (NITROSTAT) 0.4 MG SL tablet Place 1 tablet (0.4 mg total) under the tongue every 5 (five) minutes as needed. 25 tablet 6  . ONE TOUCH ULTRA TEST test strip USE ONE STRIP TO CHECK GLUCOSE ONCE DAILY AND AS NEEDED 100 each 3  . ONETOUCH DELICA LANCETS FINE MISC Check blood sugar once daily and as needed. 250.00 100 each 1  . pantoprazole (PROTONIX) 40 MG tablet  Take 2  tablet by mouth  daily (Patient taking differently: 40 mg 2 (two) times daily. ) 180 tablet 3  . pravastatin (PRAVACHOL) 40 MG tablet Take 1 tablet (40 mg total) by mouth every evening. 30 tablet 6  . traMADol (ULTRAM) 50 MG tablet TAKE ONE TABLET BY MOUTH TWICE DAILY AS NEEDED 60 tablet 0  . traZODone (DESYREL) 100 MG tablet Take 2 tablets by mouth  every night at bedtime 180 tablet 3  . triamterene-hydrochlorothiazide (MAXZIDE-25) 37.5-25 MG tablet TAKE 1 TABLET BY MOUTH ONCE A DAY 90 tablet 3  . valACYclovir (VALTREX) 1000 MG tablet TAKE 2 TABLETS BY MOUTH  DAILY AS NEEDED (AT THE  ONSET OF SYMPTOMS AND TAKE  2 TABS 12 HOURS LATER) 90 tablet 1   No facility-administered medications prior to visit.      Allergies: Lisinopril; Nitrofurantoin; Oxycodone-acetaminophen; Atorvastatin; and Clarithromycin    PHYSICAL EXAM: VS:  BP 122/78 (BP Location: Left Arm, Patient Position: Sitting, Cuff Size: Normal)   Pulse 81   Ht '5\' 4"'  (1.626 m)   Wt 156 lb 8 oz (71 kg)   BMI 26.86 kg/m  , Body mass index is 26.86 kg/m. Wt Readings from Last 3 Encounters:  01/02/17 156 lb 8 oz (71 kg)  12/18/16 158 lb (71.7 kg)  12/14/16 158 lb 8 oz (71.9 kg)    GENERAL:  well developed, well nourished, not in acute distress HEENT: normocephalic, pink conjunctivae, anicteric sclerae, no xanthelasma, normal dentition, oropharynx clear NECK:  no neck vein engorgement, JVP normal, no hepatojugular reflux, carotid upstroke brisk and symmetric, no bruit, no thyromegaly, no lymphadenopathy LUNGS:  good respiratory effort, clear to auscultation bilaterally CV:  PMI not displaced, no thrills, no lifts, S1 and S2 within normal limits, no palpable S3 or S4, no murmurs, no rubs, no gallops ABD:  Soft, nontender, nondistended, normoactive bowel sounds, no abdominal aortic bruit, no hepatomegaly, no splenomegaly MS: nontender back, no kyphosis, no scoliosis, no joint deformities EXT:  2+ DP/PT pulses, no edema, no  varicosities, no cyanosis, no clubbing, No hematoma on right radial artery, no bruit SKIN: warm, nondiaphoretic, normal turgor, no ulcers NEUROPSYCH: alert, oriented to person, place, and time, sensory/motor grossly intact, normal mood, appropriate affect   Recent Labs: 05/17/2016: Hemoglobin 13.7 12/14/2016: ALT 33; BUN 16; Creatinine, Ser 0.87; Platelets 289; Potassium 4.1; Sodium 140   Lipid Panel    Component Value Date/Time   CHOL 231 (H) 12/14/2016 1131   TRIG 228 (H) 12/14/2016 1131   HDL 59 12/14/2016 1131   CHOLHDL 3.9 12/14/2016 1131   CHOLHDL 5 05/17/2016 1216   VLDL 69.4 (H) 05/17/2016 1216   LDLCALC 126 (H) 12/14/2016 1131   LDLDIRECT 152.0 05/17/2016 1216     Other studies Reviewed:  EKG:  The ekg from 11/28/2016 was personally reviewed by me and it revealed sinus rhythm, 102 bpm, sinus tachycardia.  Additional studies/ records that were reviewed personally reviewed by me today include:  Echo 11/29/2016: Left ventricle: The cavity size was normal. Wall thickness was   increased in a pattern of mild LVH. Systolic function was normal.   The estimated ejection fraction was in the range of 55% to 60%.   Wall motion was normal; there were no regional wall motion   abnormalities. Doppler parameters are consistent with abnormal   left ventricular relaxation (grade 1 diastolic dysfunction). - Mitral valve: Systolic bowing without prolapse. There was trivial   regurgitation. - Right ventricle: The cavity size was normal. Wall thickness was   normal. Systolic function was normal.  Nuclear stress test 12/05/2016: Exercise myocardial perfusion imaging study with no significant ischemia Normal wall motion, EF estimated at 66% No EKG changes concerning for ischemia at peak stress or in recovery. Target heart rate achieved Low risk scan  Left heart cath 12/18/2016:  The left ventricular systolic function is normal.  LV end diastolic pressure is mildly  elevated.  The left ventricular ejection fraction is 55-65% by visual estimate.   1. Minimal luminal irregularities affecting the LAD. No evidence of obstructive coronary artery disease. 2. Normal LV systolic function. Mildly elevated left ventricular end-diastolic pressure.  ASSESSMENT AND PLAN: Chest pain Per last office visit 11/28/2016, Risk factors for CAD include hypertension, diabetes, significant family history. Results of left heart catheterization were discussed with patient. No evidence of significant coronary artery disease. There is minimal luminal irregularities in the LAD. Continue medical therapy. Continue aspirin 81 daily. Continue risk factor modification: Hypertension, diabetes, hyperlipidemia. Chest pain is likely noncardiac in nature. Continue to follow-up with PCP.  Hypertension BP is well controlled. Continue monitoring BP. Continue current medical therapy and lifestyle changes.  Hyperlipidemia Continue pravastatin 40 mg at bedtime. We will need to repeat blood work made 2018. Patient may follow-up with PCP for this.   Current medicines are reviewed at length with the patient today.  The patient does not have concerns regarding medicines.  Labs/ tests ordered today include:  Orders Placed This Encounter  Procedures  . Comp Met (CMET)  . Lipid Profile  . EKG 12-Lead   I counseled the patient on importance of lifestyle modification including heart healthy diet, regular physical activity.  I spent at least 25 minutes with the patient today and more than 50% of the time was spent counseling the patient and coordinating care.   Disposition:   FU with undersigned In one year  Signed, Shannon Bushy, MD  01/02/2017 10:40 AM    [place]  This note was generated in part with voice recognition software and I apologize for any typographical errors that were not detected and corrected.

## 2017-01-02 NOTE — Patient Instructions (Signed)
Labwork: Your physician recommends that you return for lab work in: 3 months for CMP and Lipid profile. Make sure not to eat or drink after midnight prior. Only a small sip of water with medications.   Follow-Up: Your physician wants you to follow-up in: 1 year with Dr. Yvone Neu. You will receive a reminder letter in the mail two months in advance. If you don't receive a letter, please call our office to schedule the follow-up appointment.  It was a pleasure seeing you today here in the office. Please do not hesitate to give [place] a call back if you have any further questions. [place], BSN

## 2017-02-24 ENCOUNTER — Other Ambulatory Visit: Payer: Self-pay | Admitting: Internal Medicine

## 2017-04-12 ENCOUNTER — Telehealth: Payer: Self-pay | Admitting: Internal Medicine

## 2017-04-12 NOTE — Telephone Encounter (Signed)
[facility] Patient Name: Shannon [name] DOB: [birth year] Initial Comment Caller states is trying to make an appt.....having high blood sugars, stomach issues and extreme weakness. Blood sugar is 300+. Nurse Assessment Nurse: Sherrell Puller, RN, Amy Date/Time Eilene Ghazi Time): 04/12/2017 4:35:24 PM Confirm and document reason for call. If symptomatic, describe symptoms. ---Caller states she's been having weakness, high blood sugars over 300 and has been having stomach issues. No abdominal pain but was having "sour" burps and diarrhea, she is not having these symptoms today. Has been having high blood sugars for 2 weeks now. She has been having the weakness for a week and it's increasing. Does the patient have any new or worsening symptoms? ---Yes Will a triage be completed? ---Yes Related visit to physician within the last 2 weeks? ---No Does the PT have any chronic conditions? (i.e. diabetes, asthma, etc.) ---Yes List chronic conditions. ---Diabetes, HTN, Glaucoma, GERDIs this a behavioral health or substance abuse call? ---No Guidelines Guideline Title Affirmed Question Affirmed Notes Diabetes - High Blood Sugar [1] Blood glucose > 300 mg/dl (16.5 mmol/ l) AND [2] two or more times in a row Final Disposition User Call PCP Now Sherrell Puller, RN, Amy Comments just got record to paste in epic and triaging nurse went off line - noted that pt had weakness - will put this record in epic then check on pt's weakness and send it again. tried to reach office and recording said office is closed. denies any one sided weakness this week. Had some weakness today - but is able to walk without holding onto things. While we were talking [place] called the pt back, so this nurse hung up to allow office to reach her. THIS IS A DUPLICATE RECORD FOR EPIC BC ADDED ONE NOTE ABOUT CHECKING WITH PATIENT TO ASK HER ABOUT HER  WEAKNESS - SEE NOTE ABOVE - NO ONESIDED WEAKNESS NOTED Call Id: [phone] Disagree/Comply: Comply

## 2017-04-12 NOTE — Telephone Encounter (Signed)
[facility] Patient Name: Shannon [name] DOB: May 06, 1956 Initial Comment Caller states is trying to make an appt.....having high blood sugars, stomach issues and extreme weakness. Blood sugar is 300+. Nurse Assessment Nurse: Sherrell Puller, RN, Amy Date/Time Shannon [name] Time): 04/12/2017 4:35:24 PM Confirm and document reason for call. If symptomatic, describe symptoms. ---Caller states she's been having weakness, high blood sugars over 300 and has been having stomach issues. No abdominal pain but was having "sour" burps and diarrhea, she is not having these symptoms today. Has been having high blood sugars for 2 weeks now. She has been having the weakness for a week and it's increasing. Does the patient have any new or worsening symptoms? ---Yes Will a triage be completed? ---Yes Related visit to physician within the last 2 weeks? ---No Does the PT have any chronic conditions? (i.e. diabetes, asthma, etc.) ---Yes List chronic conditions. ---Diabetes, HTN, Glaucoma, GERDIs this a behavioral health or substance abuse call? ---No Guidelines Guideline Title Affirmed Question Affirmed Notes Diabetes - High Blood Sugar [1] Blood glucose > 300 mg/dl (16.5 mmol/ l) AND [2] two or more times in a row Final Disposition User Call PCP Now Sherrell Puller, RN, Amy Comments just got record to paste in epic and triaging nurse went off line - noted that pt had weakness - will put this record in epic then check on pt's weakness and send it again. tried to reach office and recording said office is closed. Referrals GO TO FACILITY UNDECIDED Disagree/Comply: Comply Call Id: [phone]

## 2017-04-12 NOTE — Telephone Encounter (Signed)
I spoke with pt and pt does not want to go to UC; pt said that she is not feeling bad enough to go to UC and wants appt at [facility]. Pt scheduled 30 min appt 04/13/17 at 8:15 with Allie Bossier NP. If pt condition changes or worsens tonight pt will go to [facility] or ED. FYI to Allie Bossier NP.

## 2017-04-12 NOTE — Telephone Encounter (Signed)
Noted  

## 2017-04-13 ENCOUNTER — Ambulatory Visit (INDEPENDENT_AMBULATORY_CARE_PROVIDER_SITE_OTHER): Payer: 59 | Admitting: Primary Care

## 2017-04-13 ENCOUNTER — Other Ambulatory Visit: Payer: Self-pay | Admitting: Primary Care

## 2017-04-13 ENCOUNTER — Encounter: Payer: Self-pay | Admitting: Primary Care

## 2017-04-13 VITALS — BP 118/80 | HR 93 | Temp 98.0°F | Resp 18 | Ht 64.0 in | Wt 153.1 lb

## 2017-04-13 DIAGNOSIS — E119 Type 2 diabetes mellitus without complications: Secondary | ICD-10-CM | POA: Diagnosis not present

## 2017-04-13 DIAGNOSIS — E1142 Type 2 diabetes mellitus with diabetic polyneuropathy: Secondary | ICD-10-CM

## 2017-04-13 LAB — HEMOGLOBIN A1C: Hgb A1c MFr Bld: 8.6 % — ABNORMAL HIGH (ref 4.6–6.5)

## 2017-04-13 LAB — POCT GLUCOSE (DEVICE FOR HOME USE): GLUCOSE FASTING, POC: 195 mg/dL — AB (ref 70–99)

## 2017-04-13 MED ORDER — GLIPIZIDE ER 5 MG PO TB24: 5.0000 mg | ORAL_TABLET | Freq: Every day | ORAL | 1 refills | Status: DC

## 2017-04-13 NOTE — Progress Notes (Unsigned)
glipi

## 2017-04-13 NOTE — Progress Notes (Signed)
Subjective:    Patient ID: Shannon [name], female    DOB: August 31, 1956, 61 y.o.   MRN: [id number]  HPI  Ms. Stanger is a 61 year old female with a history of Type 2 Diabetes, essential hypertension who presents today with a chief complaint of hyperglycemia.   She is currently managed on Metformin 1000 mg twice daily. Her last A1C was 8.2 in December 2017 which was an increase from 7.4 in June 2017.   She's been checking her blood sugars for the past three weeks due to feelings of feeling sweaty, headaches, weakness, fatigue. Her glucose fasting is running 180- upper 200's, she doesn't check in the afternoon, evening. She did not check her blood sugar this morning. She's been under a lot of personal stress over the past 2 months.   Diet currently consists of:  Breakfast: Skips, grits, toast Lunch: Sandwich Dinner: Meat, vegetables Snacks: None Desserts: Occasionally Beverages: Water, sweet tea rarely  Exercise: She does not currently exercise, is active around her house.   Review of Systems  Constitutional: Positive for diaphoresis and fatigue.  Respiratory: Negative for shortness of breath.   Cardiovascular: Negative for chest pain.  Gastrointestinal: Positive for nausea.  Neurological: Positive for headaches.       Past Medical History:  Diagnosis Date  . Allergy   . Asthma   . Depression   . Diabetes mellitus 11/12  . GERD (gastroesophageal reflux disease)   . Glaucoma suspect   . Hyperlipidemia   . Hypertension   . Mitral valve prolapse   . Osteopenia   . Shingles 11/2006  . Sleep disturbance   . Tachycardia      Social History   Social History  . Marital status: Married    Spouse name: N/A  . Number of children: 5  . Years of education: N/A   Occupational History  . homemaker    Social History Main Topics  . Smoking status: Never Smoker  . Smokeless tobacco: Never Used  . Alcohol use No  . Drug use: No  . Sexual activity: Not on file   Other  Topics Concern  . Not on file   Social History Narrative   Home maker   Doesn't exercise      [name] sister          Past Surgical History:  Procedure Laterality Date  . ABDOMINAL HYSTERECTOMY  2000   /BSO - abn. Paps  . APPENDECTOMY  1978  . AUGMENTATION MAMMAPLASTY Bilateral 1981  . BLADDER REPAIR     x2  . BREAST ENHANCEMENT SURGERY  1987  . [place] Left 12/18/2016   Procedure: Left Heart Cath and Coronary Angiography;  Surgeon: Wellington Hampshire, MD;  Location: [facility];  Service: Cardiovascular;  Laterality: Left;  . CERVICAL DISCECTOMY  11/2009   with synthetic discs inserted--  Dr Arnoldo Morale  . NM MYOVIEW LTD  03/2005   EF 56%, no ischemia  . RECTAL PROLAPSE REPAIR     x1  . SHOULDER SURGERY  09/2008, 01/2009   Right shoulder surgery, then redone with lysis of adhesions    Family History  Problem Relation Age of Onset  . Diabetes Mother   . Heart disease Mother   . Hypertension Mother   . Ovarian cancer Mother   . Melanoma Mother   . Cancer Father   . Coronary artery disease Father   . Heart disease Father   .  Diabetes Sister   . Depression Sister   . Coronary artery disease Sister   . Heart disease Sister   . Rheum arthritis Maternal Aunt   . Breast cancer Maternal Grandmother   . Diabetes Maternal Grandmother   . Heart attack Paternal Uncle   . Heart attack Cousin   . Heart attack Paternal Uncle   . Diabetes Maternal Grandfather   . Colon cancer Neg Hx     Allergies  Allergen Reactions  . Lisinopril     REACTION: cough  . Nitrofurantoin Other (See Comments)    Pt unsure  . Oxycodone-Acetaminophen     REACTION: itching  . Atorvastatin Diarrhea    May have caused colitis--but not clear cut  . Clarithromycin Palpitations    Current Outpatient Prescriptions on File Prior to Visit  Medication Sig Dispense Refill  . aspirin EC 81 MG tablet Take 1 tablet (81 mg total) by mouth  daily. 90 tablet 3  . aspirin-acetaminophen-caffeine (EXCEDRIN MIGRAINE) 250-250-65 MG tablet Take 1 tablet by mouth every 6 (six) hours as needed for headache.    . LUMIGAN 0.01 % SOLN Place 1 drop into both eyes at bedtime.     . metFORMIN (GLUCOPHAGE) 1000 MG tablet Take 1 tablet (1,000 mg total) by mouth 2 (two) times daily with a meal. 180 tablet 3  . metoprolol succinate (TOPROL-XL) 25 MG 24 hr tablet Take 1.5 tablets (37.5 mg total) by mouth daily. 45 tablet 6  . Multiple Vitamins-Minerals (MULTIVITAMIN GUMMIES ADULT) CHEW Chew 2 each by mouth daily.    . ondansetron (ZOFRAN) 4 MG tablet TAKE ONE TABLET BY MOUTH THREE TIMES DAILY AS NEEDED FOR NAUSEA AND VOMITING 60 tablet 0  . ONE TOUCH ULTRA TEST test strip USE ONE STRIP TO CHECK GLUCOSE ONCE DAILY AND AS NEEDED 100 each 3  . ONETOUCH DELICA LANCETS FINE MISC Check blood sugar once daily and as needed. 250.00 100 each 1  . pantoprazole (PROTONIX) 40 MG tablet TAKE 2 TABLETS BY MOUTH  DAILY 180 tablet 3  . traMADol (ULTRAM) 50 MG tablet TAKE ONE TABLET BY MOUTH TWICE DAILY AS NEEDED 60 tablet 0  . traZODone (DESYREL) 100 MG tablet TAKE 2 TABLETS BY MOUTH  EVERY NIGHT AT BEDTIME 180 tablet 3  . triamterene-hydrochlorothiazide (MAXZIDE-25) 37.5-25 MG tablet TAKE 1 TABLET BY MOUTH ONCE A DAY 90 tablet 3  . valACYclovir (VALTREX) 1000 MG tablet TAKE 2 TABLETS BY MOUTH  DAILY AS NEEDED (AT THE  ONSET OF SYMPTOMS AND TAKE  2 TABS 12 HOURS LATER) 90 tablet 1  . cholecalciferol (VITAMIN D) 1000 units tablet Take 1,000 Units by mouth daily.    . nitroGLYCERIN (NITROSTAT) 0.4 MG SL tablet Place 1 tablet (0.4 mg total) under the tongue every 5 (five) minutes as needed. (Patient not taking: Reported on 04/13/2017) 25 tablet 6   No current facility-administered medications on file prior to visit.     BP 118/80   Pulse 93   Temp 98 F (36.7 C) (Oral)   Resp 18   Ht 5\' 4"  (1.626 m)   Wt 153 lb 1.9 oz (69.5 kg)   SpO2 96%   BMI 26.28 kg/m     Objective:   Physical Exam  Constitutional: She appears well-nourished.  Neck: Neck supple.  Cardiovascular: Normal rate and regular rhythm.   Pulmonary/Chest: Effort normal and breath sounds normal.  Skin: Skin is warm and dry.          Assessment & Plan:

## 2017-04-13 NOTE — Assessment & Plan Note (Addendum)
Suspect A1C to be worse given hyperglycemia, A1C pending today. Continue Metformin, will likely add in additional treatment once labs return. Fasting glucose in the office today of 195.  Discussed to work on stress reduction, start exercising. Could not tolerate statin, she will notify cardiology. Not managed on Ace/ARB, urine microalbumin negative in 04/2016.

## 2017-04-13 NOTE — Addendum Note (Signed)
Addended by: Jacqualin Combes on: 04/13/2017 08:52 AM   Modules accepted: Orders

## 2017-04-13 NOTE — Patient Instructions (Addendum)
Complete lab work prior to leaving today. I will notify you of your results once received.   Work on stress reduction.  Start exercising. You should be getting 150 minutes of moderate intensity exercise weekly.  It was a pleasure meeting you!

## 2017-04-20 ENCOUNTER — Ambulatory Visit (INDEPENDENT_AMBULATORY_CARE_PROVIDER_SITE_OTHER): Payer: 59 | Admitting: Internal Medicine

## 2017-04-20 ENCOUNTER — Telehealth: Payer: Self-pay | Admitting: Internal Medicine

## 2017-04-20 ENCOUNTER — Encounter: Payer: Self-pay | Admitting: Internal Medicine

## 2017-04-20 VITALS — BP 110/70 | HR 107 | Temp 98.0°F | Wt 153.0 lb

## 2017-04-20 DIAGNOSIS — R5383 Other fatigue: Secondary | ICD-10-CM | POA: Insufficient documentation

## 2017-04-20 DIAGNOSIS — E1140 Type 2 diabetes mellitus with diabetic neuropathy, unspecified: Secondary | ICD-10-CM

## 2017-04-20 DIAGNOSIS — E1165 Type 2 diabetes mellitus with hyperglycemia: Secondary | ICD-10-CM

## 2017-04-20 DIAGNOSIS — IMO0002 Reserved for concepts with insufficient information to code with codable children: Secondary | ICD-10-CM

## 2017-04-20 LAB — CBC WITH DIFFERENTIAL/PLATELET
BASOS ABS: 0 10*3/uL (ref 0.0–0.1)
Basophils Relative: 0.4 % (ref 0.0–3.0)
EOS ABS: 0.5 10*3/uL (ref 0.0–0.7)
Eosinophils Relative: 5.8 % — ABNORMAL HIGH (ref 0.0–5.0)
HEMATOCRIT: 41 % (ref 36.0–46.0)
Hemoglobin: 13.8 g/dL (ref 12.0–15.0)
LYMPHS PCT: 31.2 % (ref 12.0–46.0)
Lymphs Abs: 2.9 10*3/uL (ref 0.7–4.0)
MCHC: 33.5 g/dL (ref 30.0–36.0)
MCV: 86.2 fl (ref 78.0–100.0)
MONO ABS: 0.9 10*3/uL (ref 0.1–1.0)
Monocytes Relative: 9.4 % (ref 3.0–12.0)
Neutro Abs: 5 10*3/uL (ref 1.4–7.7)
Neutrophils Relative %: 53.2 % (ref 43.0–77.0)
Platelets: 309 10*3/uL (ref 150.0–400.0)
RBC: 4.76 Mil/uL (ref 3.87–5.11)
RDW: 13.7 % (ref 11.5–15.5)
WBC: 9.3 10*3/uL (ref 4.0–10.5)

## 2017-04-20 LAB — COMPREHENSIVE METABOLIC PANEL
ALBUMIN: 4.6 g/dL (ref 3.5–5.2)
ALT: 31 U/L (ref 0–35)
AST: 21 U/L (ref 0–37)
Alkaline Phosphatase: 78 U/L (ref 39–117)
BILIRUBIN TOTAL: 0.3 mg/dL (ref 0.2–1.2)
BUN: 13 mg/dL (ref 6–23)
CHLORIDE: 98 meq/L (ref 96–112)
CO2: 30 meq/L (ref 19–32)
CREATININE: 0.99 mg/dL (ref 0.40–1.20)
Calcium: 10.3 mg/dL (ref 8.4–10.5)
GFR: 60.98 mL/min (ref >60.00)
Glucose, Bld: 145 mg/dL — ABNORMAL HIGH (ref 70–99)
Potassium: 4.4 mEq/L (ref 3.5–5.1)
SODIUM: 137 meq/L (ref 135–145)
Total Protein: 7.5 g/dL (ref 6.0–8.3)

## 2017-04-20 LAB — T4, FREE: Free T4: 1.13 ng/dL (ref 0.60–1.60)

## 2017-04-20 LAB — VITAMIN B12: VITAMIN B 12: 316 pg/mL (ref 211–911)

## 2017-04-20 MED ORDER — PEN NEEDLES 32G X 6 MM MISC: 1.0000 [IU] | Freq: Every day | 3 refills | Status: DC

## 2017-04-20 MED ORDER — BASAGLAR KWIKPEN 100 UNIT/ML ~~LOC~~ SOPN: 6.0000 [IU] | PEN_INJECTOR | Freq: Every day | SUBCUTANEOUS | 3 refills | Status: DC

## 2017-04-20 NOTE — Patient Instructions (Signed)
Please start the lantus with just 6 units daily (probably at bedtime is best). If you are doing well after a week, and your fasting sugars are generally over 140-150, increase by 1-2 units every week or so till your fasting sugars come down. Let me know if you have any serious low sugar problems.

## 2017-04-20 NOTE — Assessment & Plan Note (Signed)
No obvious infection or clear cut problem based on history and exam. May be related to her labile sugars as she is very sensitive Will just check some labs

## 2017-04-20 NOTE — Telephone Encounter (Signed)
I sent in rx for pen needles

## 2017-04-20 NOTE — Telephone Encounter (Signed)
Rx went to pharmacy and the rx  didn't include the rx for the pen needles.  They'll go ahead and fill rx and replace it when the order comes in.

## 2017-04-20 NOTE — Progress Notes (Signed)
Subjective:    Patient ID: Shannon [name], female    DOB: 09/06/1956, 61 y.o.   MRN: [id number]  HPI Here to discuss Rx for diabetes Fasting sugars had been as high as 300 recently Now --"all over the place" Doesn't tolerate low blood sugar--and actually having sweats and nausea if goes down to 110 Feels weak ---not sure if related to diabetes  Only taking the metformin Only tried the glipizide for 2 days--gave serious hypoglycemia  Current Outpatient Prescriptions on File Prior to Visit  Medication Sig Dispense Refill  . aspirin EC 81 MG tablet Take 1 tablet (81 mg total) by mouth daily. 90 tablet 3  . aspirin-acetaminophen-caffeine (EXCEDRIN MIGRAINE) 250-250-65 MG tablet Take 1 tablet by mouth every 6 (six) hours as needed for headache.    . cholecalciferol (VITAMIN D) 1000 units tablet Take 1,000 Units by mouth daily.    Marland Kitchen LUMIGAN 0.01 % SOLN Place 1 drop into both eyes at bedtime.     . metFORMIN (GLUCOPHAGE) 1000 MG tablet Take 1 tablet (1,000 mg total) by mouth 2 (two) times daily with a meal. 180 tablet 3  . metoprolol succinate (TOPROL-XL) 25 MG 24 hr tablet Take 1.5 tablets (37.5 mg total) by mouth daily. 45 tablet 6  . Multiple Vitamins-Minerals (MULTIVITAMIN GUMMIES ADULT) CHEW Chew 2 each by mouth daily.    . nitroGLYCERIN (NITROSTAT) 0.4 MG SL tablet Place 1 tablet (0.4 mg total) under the tongue every 5 (five) minutes as needed. 25 tablet 6  . ondansetron (ZOFRAN) 4 MG tablet TAKE ONE TABLET BY MOUTH THREE TIMES DAILY AS NEEDED FOR NAUSEA AND VOMITING 60 tablet 0  . ONE TOUCH ULTRA TEST test strip USE ONE STRIP TO CHECK GLUCOSE ONCE DAILY AND AS NEEDED 100 each 3  . ONETOUCH DELICA LANCETS FINE MISC Check blood sugar once daily and as needed. 250.00 100 each 1  . pantoprazole (PROTONIX) 40 MG tablet TAKE 2 TABLETS BY MOUTH  DAILY 180 tablet 3  . traMADol (ULTRAM) 50 MG tablet TAKE ONE TABLET BY MOUTH TWICE DAILY AS NEEDED 60 tablet 0  . traZODone (DESYREL) 100 MG tablet  TAKE 2 TABLETS BY MOUTH  EVERY NIGHT AT BEDTIME 180 tablet 3  . triamterene-hydrochlorothiazide (MAXZIDE-25) 37.5-25 MG tablet TAKE 1 TABLET BY MOUTH ONCE A DAY 90 tablet 3  . valACYclovir (VALTREX) 1000 MG tablet TAKE 2 TABLETS BY MOUTH  DAILY AS NEEDED (AT THE  ONSET OF SYMPTOMS AND TAKE  2 TABS 12 HOURS LATER) 90 tablet 1  . glipiZIDE (GLUCOTROL XL) 5 MG 24 hr tablet Take 1 tablet (5 mg total) by mouth daily with breakfast. (Patient not taking: Reported on 04/20/2017) 90 tablet 1   No current facility-administered medications on file prior to visit.     Allergies  Allergen Reactions  . Lisinopril     REACTION: cough  . Nitrofurantoin Other (See Comments)    Pt unsure  . Oxycodone-Acetaminophen     REACTION: itching  . Atorvastatin Diarrhea    May have caused colitis--but not clear cut  . Clarithromycin Palpitations    Past Medical History:  Diagnosis Date  . Allergy   . Asthma   . Depression   . Diabetes mellitus 11/12  . GERD (gastroesophageal reflux disease)   . Glaucoma suspect   . Hyperlipidemia   . Hypertension   . Mitral valve prolapse   . Osteopenia   . Shingles 11/2006  . Sleep disturbance   . Tachycardia  Past Surgical History:  Procedure Laterality Date  . ABDOMINAL HYSTERECTOMY  2000   /BSO - abn. Paps  . APPENDECTOMY  1978  . AUGMENTATION MAMMAPLASTY Bilateral 1981  . BLADDER REPAIR     x2  . BREAST ENHANCEMENT SURGERY  1987  . [place] Left 12/18/2016   Procedure: Left Heart Cath and Coronary Angiography;  Surgeon: Wellington Hampshire, MD;  Location: [facility];  Service: Cardiovascular;  Laterality: Left;  . CERVICAL DISCECTOMY  11/2009   with synthetic discs inserted--  Dr Arnoldo Morale  . NM MYOVIEW LTD  03/2005   EF 56%, no ischemia  . RECTAL PROLAPSE REPAIR     x1  . SHOULDER SURGERY  09/2008, 01/2009   Right shoulder surgery, then redone with lysis of adhesions    Family History    Problem Relation Age of Onset  . Diabetes Mother   . Heart disease Mother   . Hypertension Mother   . Ovarian cancer Mother   . Melanoma Mother   . Cancer Father   . Coronary artery disease Father   . Heart disease Father   . Diabetes Sister   . Depression Sister   . Coronary artery disease Sister   . Heart disease Sister   . Rheum arthritis Maternal Aunt   . Breast cancer Maternal Grandmother   . Diabetes Maternal Grandmother   . Heart attack Paternal Uncle   . Heart attack Cousin   . Heart attack Paternal Uncle   . Diabetes Maternal Grandfather   . Colon cancer Neg Hx     Social History   Social History  . Marital status: Married    Spouse name: N/A  . Number of children: 5  . Years of education: N/A   Occupational History  . homemaker    Social History Main Topics  . Smoking status: Never Smoker  . Smokeless tobacco: Never Used  . Alcohol use No  . Drug use: No  . Sexual activity: Not on file   Other Topics Concern  . Not on file   Social History Narrative   Home maker   Doesn't exercise      [name] sister         Review of Systems Some bowel issues as well-- constipation at times but now diarrhea again Appetite is not great Weight is slightly down--but reasonably stable over the past year Had some dizziness upon standing--- and had to get back down again No fever No cough  Getting easier DOE    Objective:   Physical Exam  Constitutional: She appears well-developed and well-nourished. No distress.  Neck: No thyromegaly present.  Cardiovascular: Normal rate, regular rhythm and normal heart sounds.  Exam reveals no gallop.   No murmur heard. Pulmonary/Chest: Effort normal and breath sounds normal. No respiratory distress. She has no wheezes. She has no rales.  Abdominal: Soft. She exhibits no distension. There is no tenderness. There is no rebound and no guarding.  Musculoskeletal: She exhibits no edema or tenderness.  Lymphadenopathy:        Head (left side): Tonsillar adenopathy present.    She has no cervical adenopathy.       Right: No inguinal adenopathy present.       Left: No inguinal adenopathy present.  Skin: No rash noted. No erythema.  Psychiatric: She has a normal mood and affect. Her behavior is normal.  Assessment & Plan:

## 2017-04-20 NOTE — Assessment & Plan Note (Signed)
Highly labile also Didn't do well with glipizide Will try low dose lantus

## 2017-05-01 ENCOUNTER — Other Ambulatory Visit: Payer: Self-pay | Admitting: Internal Medicine

## 2017-05-03 ENCOUNTER — Telehealth: Payer: Self-pay

## 2017-05-03 ENCOUNTER — Other Ambulatory Visit: Payer: Self-pay | Admitting: Internal Medicine

## 2017-05-03 DIAGNOSIS — R000 Tachycardia, unspecified: Secondary | ICD-10-CM

## 2017-05-03 NOTE — Telephone Encounter (Signed)
Spoke to pt. She will stop the basaglar. She said she was only taking 2 units of the basaglar. She will start taking the metformin bid again. She will keep [place] updated.

## 2017-05-03 NOTE — Telephone Encounter (Signed)
Have her restart the metformin but hold off on the basaglar. We can try this again at a lower dose if her fasting sugars go back up over 150-160

## 2017-05-03 NOTE — Telephone Encounter (Signed)
She said she started the [place] on 04-23-17 along with Metformin bid. She says for the last 3 days, her blood sugar has gone down to 110 and she gets sweaty, nauseous, weak. She stopped taking the Basaglar and 1 metformin since and said she is doing better. Her FBS has been 140s. She says she cannot handle a blood sugar under 120. Wants to know what to do.

## 2017-05-31 ENCOUNTER — Other Ambulatory Visit: Payer: Self-pay | Admitting: Internal Medicine

## 2017-06-01 DIAGNOSIS — H401131 Primary open-angle glaucoma, bilateral, mild stage: Secondary | ICD-10-CM | POA: Diagnosis not present

## 2017-06-15 ENCOUNTER — Encounter: Payer: 59 | Admitting: Internal Medicine

## 2017-06-18 ENCOUNTER — Ambulatory Visit (INDEPENDENT_AMBULATORY_CARE_PROVIDER_SITE_OTHER): Payer: 59 | Admitting: Internal Medicine

## 2017-06-18 ENCOUNTER — Encounter: Payer: Self-pay | Admitting: Internal Medicine

## 2017-06-18 VITALS — BP 126/84 | HR 91 | Temp 98.5°F | Ht 64.5 in | Wt 152.0 lb

## 2017-06-18 DIAGNOSIS — I10 Essential (primary) hypertension: Secondary | ICD-10-CM

## 2017-06-18 DIAGNOSIS — M545 Low back pain, unspecified: Secondary | ICD-10-CM | POA: Insufficient documentation

## 2017-06-18 DIAGNOSIS — E1165 Type 2 diabetes mellitus with hyperglycemia: Secondary | ICD-10-CM | POA: Diagnosis not present

## 2017-06-18 DIAGNOSIS — Z23 Encounter for immunization: Secondary | ICD-10-CM | POA: Diagnosis not present

## 2017-06-18 DIAGNOSIS — K219 Gastro-esophageal reflux disease without esophagitis: Secondary | ICD-10-CM

## 2017-06-18 DIAGNOSIS — Z0000 Encounter for general adult medical examination without abnormal findings: Secondary | ICD-10-CM | POA: Diagnosis not present

## 2017-06-18 DIAGNOSIS — E1140 Type 2 diabetes mellitus with diabetic neuropathy, unspecified: Secondary | ICD-10-CM | POA: Diagnosis not present

## 2017-06-18 DIAGNOSIS — R300 Dysuria: Secondary | ICD-10-CM

## 2017-06-18 DIAGNOSIS — IMO0002 Reserved for concepts with insufficient information to code with codable children: Secondary | ICD-10-CM

## 2017-06-18 LAB — POC URINALSYSI DIPSTICK (AUTOMATED)
BILIRUBIN UA: NEGATIVE
GLUCOSE UA: NEGATIVE
KETONES UA: NEGATIVE
Leukocytes, UA: NEGATIVE
Nitrite, UA: NEGATIVE
Protein, UA: NEGATIVE
RBC UA: NEGATIVE
UROBILINOGEN UA: 0.2 U/dL
pH, UA: 6 (ref 5.0–8.0)

## 2017-06-18 LAB — HM DIABETES FOOT EXAM

## 2017-06-18 MED ORDER — GLUCOSE BLOOD VI STRP: ORAL_STRIP | 3 refills | Status: DC

## 2017-06-18 MED ORDER — ESTRADIOL 0.1 MG/GM VA CREA: 1.0000 | TOPICAL_CREAM | VAGINAL | 12 refills | Status: DC

## 2017-06-18 NOTE — Patient Instructions (Addendum)
Please consider trying fluticasone nasal spray daily for the congestion and drainage. Please try 1/2 metformin (500mg ) before dinner. Try heat and the inversion table for your back. If it is not getting better, let me know and I can refer you to a physical therapist.

## 2017-06-18 NOTE — Assessment & Plan Note (Signed)
Seems to be better and hasn't tolerated second meds Will add 1/2 metformin in evening Recheck 3 months

## 2017-06-18 NOTE — Progress Notes (Signed)
Subjective:    Patient ID: Shannon [name], female    DOB: 21-Jun-1956, 61 y.o.   MRN: [id number]  HPI Here for physical  Doing better with the diabetes Didn't tolerate the glipizide or basaglar due to hypoglycemia Now back to just daily on the metformin and sugars are better Checking every morning --- mostly 130's Mild sensory changes in feet---some pain along her 1st MTP on right---was woken her at night (lasts for a few hours or days)  Intolerant of multiple statins  Ongoing back problems--- had been better for a year but now worsening Gets radiation down her legs Has been building up over the past few months Can be first thing in the morning--or later in day Just uses heating pad--avoids pain meds Still does yard work, Medical laboratory scientific officer, garden, Social research officer, government  No more chest pain Cardiac cath was reassuring Doesn't use the nitro  Still gets sour taste in mouth-- in AM at times---like rotten eggs Will go on all day and then resolve On the protonix twice a day  Current Outpatient Prescriptions on File Prior to Visit  Medication Sig Dispense Refill  . aspirin EC 81 MG tablet Take 1 tablet (81 mg total) by mouth daily. 90 tablet 3  . aspirin-acetaminophen-caffeine (EXCEDRIN MIGRAINE) 250-250-65 MG tablet Take 1 tablet by mouth every 6 (six) hours as needed for headache.    . LUMIGAN 0.01 % SOLN Place 1 drop into both eyes at bedtime.     . metFORMIN (GLUCOPHAGE) 1000 MG tablet Take 1 tablet (1,000 mg total) by mouth 2 (two) times daily with a meal. (Patient taking differently: Take 1,000 mg by mouth daily with breakfast. ) 180 tablet 3  . metoprolol succinate (TOPROL-XL) 25 MG 24 hr tablet Take 1.5 tablets (37.5 mg total) by mouth daily. 135 tablet 3  . Multiple Vitamins-Minerals (MULTIVITAMIN GUMMIES ADULT) CHEW Chew 2 each by mouth daily.    . ondansetron (ZOFRAN) 4 MG tablet TAKE 1 TABLET BY MOUTH THREE TIMES DAILY AS NEEDED FOR NAUSEA OR VOMITING 60 tablet 2  . ONE TOUCH ULTRA TEST test strip  USE ONE STRIP TO CHECK GLUCOSE ONCE DAILY AND AS NEEDED 100 each 3  . ONETOUCH DELICA LANCETS FINE MISC Check blood sugar once daily and as needed. 250.00 100 each 1  . pantoprazole (PROTONIX) 40 MG tablet TAKE 2 TABLETS BY MOUTH  DAILY 180 tablet 3  . traZODone (DESYREL) 100 MG tablet TAKE 2 TABLETS BY MOUTH  EVERY NIGHT AT BEDTIME 180 tablet 3  . triamterene-hydrochlorothiazide (MAXZIDE-25) 37.5-25 MG tablet TAKE 1 TABLET BY MOUTH ONCE A DAY 90 tablet 3  . valACYclovir (VALTREX) 1000 MG tablet TAKE 2 TABLETS BY MOUTH  DAILY AS NEEDED (AT THE  ONSET OF SYMPTOMS AND TAKE  2 TABS 12 HOURS LATER) 90 tablet 0   No current facility-administered medications on file prior to visit.     Allergies  Allergen Reactions  . Lisinopril     REACTION: cough  . Nitrofurantoin Other (See Comments)    Pt unsure  . Oxycodone-Acetaminophen     REACTION: itching  . Atorvastatin Diarrhea    May have caused colitis--but not clear cut  . Clarithromycin Palpitations  . Glipizide Other (See Comments)    Sugars too variable    Past Medical History:  Diagnosis Date  . Allergy   . Asthma   . Depression   . Diabetes mellitus 11/12  . GERD (gastroesophageal reflux disease)   . Glaucoma suspect   . Hyperlipidemia   .  Hypertension   . Mitral valve prolapse   . Osteopenia   . Shingles 11/2006  . Sleep disturbance   . Tachycardia     Past Surgical History:  Procedure Laterality Date  . ABDOMINAL HYSTERECTOMY  2000   /BSO - abn. Paps  . APPENDECTOMY  1978  . AUGMENTATION MAMMAPLASTY Bilateral 1981  . BLADDER REPAIR     x2  . BREAST ENHANCEMENT SURGERY  1987  . [place] Left 12/18/2016   Procedure: Left Heart Cath and Coronary Angiography;  Surgeon: Wellington Hampshire, MD;  Location: [facility];  Service: Cardiovascular;  Laterality: Left;  . CERVICAL DISCECTOMY  11/2009   with synthetic discs inserted--  Dr Arnoldo Morale  . NM MYOVIEW LTD   03/2005   EF 56%, no ischemia  . RECTAL PROLAPSE REPAIR     x1  . SHOULDER SURGERY  09/2008, 01/2009   Right shoulder surgery, then redone with lysis of adhesions    Family History  Problem Relation Age of Onset  . Diabetes Mother   . Heart disease Mother   . Hypertension Mother   . Ovarian cancer Mother   . Melanoma Mother   . Cancer Father   . Coronary artery disease Father   . Heart disease Father   . Diabetes Sister   . Depression Sister   . Coronary artery disease Sister   . Heart disease Sister   . Rheum arthritis Maternal Aunt   . Breast cancer Maternal Grandmother   . Diabetes Maternal Grandmother   . Heart attack Paternal Uncle   . Heart attack Cousin   . Heart attack Paternal Uncle   . Diabetes Maternal Grandfather   . Colon cancer Neg Hx     Social History   Social History  . Marital status: Married    Spouse name: N/A  . Number of children: 5  . Years of education: N/A   Occupational History  . homemaker    Social History Main Topics  . Smoking status: Never Smoker  . Smokeless tobacco: Never Used  . Alcohol use No  . Drug use: No  . Sexual activity: Not on file   Other Topics Concern  . Not on file   Social History Narrative   Home maker   Doesn't exercise      [name] sister         Review of Systems  Constitutional: Negative for fatigue and unexpected weight change.       Wears seat belt  HENT: Positive for congestion. Negative for dental problem, hearing loss and trouble swallowing.        Keeps up with dentist  Eyes: Negative for visual disturbance.       No diplopia or unilateral vision loss  Respiratory: Positive for cough. Negative for chest tightness and shortness of breath.   Cardiovascular: Negative for chest pain, palpitations and leg swelling.  Gastrointestinal: Positive for constipation.       Known rectocele--told she needs this repaired  Endocrine: Positive for polydipsia.  Genitourinary: Positive for  dyspareunia and dysuria. Negative for difficulty urinating.  Musculoskeletal: Positive for arthralgias and back pain.       Some swelling in knuckles and toe pain  Skin: Negative for rash.  Allergic/Immunologic: Positive for environmental allergies. Negative for immunocompromised state.  Neurological: Positive for headaches. Negative for dizziness, syncope and light-headedness.  Hematological: Negative for adenopathy. Does not bruise/bleed easily.  Psychiatric/Behavioral: Positive  for sleep disturbance. Negative for dysphoric mood.       Chronic stress--- her daughter, SIL and 4 children living with her       Objective:   Physical Exam  Constitutional: She is oriented to person, place, and time. She appears well-developed and well-nourished. No distress.  HENT:  Head: Normocephalic and atraumatic.  Right Ear: External ear normal.  Left Ear: External ear normal.  Mouth/Throat: Oropharynx is clear and moist. No oropharyngeal exudate.  Eyes: Pupils are equal, round, and reactive to light. Conjunctivae are normal.  Neck: Normal range of motion. Neck supple. No thyromegaly present.  Cardiovascular: Normal rate, regular rhythm, normal heart sounds and intact distal pulses.  Exam reveals no gallop.   No murmur heard. Pulmonary/Chest: Effort normal and breath sounds normal. No respiratory distress. She has no wheezes. She has no rales.  Abdominal: Soft. There is no tenderness.  Musculoskeletal: She exhibits no edema or tenderness.  Some lumbar tenderness--mostly right paraspinal with some over spine and to left. SLR negative  Lymphadenopathy:    She has no cervical adenopathy.  Neurological: She is alert and oriented to person, place, and time.  Mild decreased sensation in feet  Skin:  No foot lesions  Psychiatric: She has a normal mood and affect. Her behavior is normal.          Assessment & Plan:

## 2017-06-18 NOTE — Assessment & Plan Note (Signed)
Seems to be muscular Discussed heat, inversion table Consider PT

## 2017-06-18 NOTE — Addendum Note (Signed)
Addended by: Pilar Grammes on: 06/18/2017 12:23 PM   Modules accepted: Orders

## 2017-06-18 NOTE — Assessment & Plan Note (Signed)
BP Readings from Last 3 Encounters:  06/18/17 126/84  04/20/17 110/70  04/13/17 118/80   Good control

## 2017-06-18 NOTE — Assessment & Plan Note (Signed)
Bad water brash despite PPI bid May need to go back to Dr Vira Agar

## 2017-06-18 NOTE — Assessment & Plan Note (Signed)
Gradual weight loss Mammogram due soon---1 year recall after false alarm. Discussed Colon due 2021 prevnar today Yearly flu vaccine

## 2017-08-14 ENCOUNTER — Other Ambulatory Visit: Payer: Self-pay

## 2017-08-14 MED ORDER — TRAZODONE HCL 100 MG PO TABS: 200.0000 mg | ORAL_TABLET | Freq: Every day | ORAL | 0 refills | Status: DC

## 2017-08-14 NOTE — Telephone Encounter (Signed)
Approved: okay to send 30 day supply locally x 0

## 2017-08-14 NOTE — Telephone Encounter (Signed)
Pt is out of trazodone and request refill to [facility] [address] while waiting on mail order prescription. Pt request cb when done. Last seen annual 06/18/17.

## 2017-09-04 DIAGNOSIS — Z23 Encounter for immunization: Secondary | ICD-10-CM | POA: Diagnosis not present

## 2017-09-19 ENCOUNTER — Ambulatory Visit (INDEPENDENT_AMBULATORY_CARE_PROVIDER_SITE_OTHER): Payer: 59 | Admitting: Internal Medicine

## 2017-09-19 ENCOUNTER — Encounter: Payer: Self-pay | Admitting: Internal Medicine

## 2017-09-19 VITALS — BP 122/88 | HR 80 | Temp 98.1°F | Wt 152.0 lb

## 2017-09-19 DIAGNOSIS — IMO0002 Reserved for concepts with insufficient information to code with codable children: Secondary | ICD-10-CM

## 2017-09-19 DIAGNOSIS — E1165 Type 2 diabetes mellitus with hyperglycemia: Secondary | ICD-10-CM | POA: Diagnosis not present

## 2017-09-19 DIAGNOSIS — E1140 Type 2 diabetes mellitus with diabetic neuropathy, unspecified: Secondary | ICD-10-CM | POA: Diagnosis not present

## 2017-09-19 LAB — HEMOGLOBIN A1C: Hgb A1c MFr Bld: 7 % — ABNORMAL HIGH (ref 4.6–6.5)

## 2017-09-19 MED ORDER — ONETOUCH ULTRA 2 W/DEVICE KIT: 1.0000 | PACK | Freq: Once | 0 refills | Status: AC

## 2017-09-19 MED ORDER — ONDANSETRON HCL 4 MG PO TABS: ORAL_TABLET | ORAL | 2 refills | Status: DC

## 2017-09-19 NOTE — Addendum Note (Signed)
Addended by: Pilar Grammes on: 09/19/2017 11:39 AM   Modules accepted: Orders

## 2017-09-19 NOTE — Progress Notes (Signed)
Subjective:    Patient ID: Shannon [name], female    DOB: 11-Nov-1956, 61 y.o.   MRN: [id number]  HPI Here for follow of diabetes  Not checking sugars much Lots going on in family--baby in NICU, mom's health declining, 2 people with cancer No low sugar reactions Taking 1.5 metformin now---tolerating that No change in feet--- 1 toe is numb mainly No sig pain  No GI problems  Current Outpatient Prescriptions on File Prior to Visit  Medication Sig Dispense Refill  . aspirin EC 81 MG tablet Take 1 tablet (81 mg total) by mouth daily. 90 tablet 3  . aspirin-acetaminophen-caffeine (EXCEDRIN MIGRAINE) 250-250-65 MG tablet Take 1 tablet by mouth every 6 (six) hours as needed for headache.    . estradiol (ESTRACE) 0.1 MG/GM vaginal cream Place 1 Applicatorful vaginally 3 (three) times a week. 42.5 g 12  . glucose blood (ONE TOUCH ULTRA TEST) test strip USE ONE STRIP TO CHECK GLUCOSE ONCE DAILY AND AS NEEDED 100 each 3  . LUMIGAN 0.01 % SOLN Place 1 drop into both eyes at bedtime.     . metFORMIN (GLUCOPHAGE) 1000 MG tablet Take 1 tablet (1,000 mg total) by mouth 2 (two) times daily with a meal. (Patient taking differently: Take 1,000 mg by mouth daily with breakfast. ) 180 tablet 3  . metoprolol succinate (TOPROL-XL) 25 MG 24 hr tablet Take 1.5 tablets (37.5 mg total) by mouth daily. 135 tablet 3  . Multiple Vitamins-Minerals (MULTIVITAMIN GUMMIES ADULT) CHEW Chew 2 each by mouth daily.    . ondansetron (ZOFRAN) 4 MG tablet TAKE 1 TABLET BY MOUTH THREE TIMES DAILY AS NEEDED FOR NAUSEA OR VOMITING 60 tablet 2  . ONETOUCH DELICA LANCETS FINE MISC Check blood sugar once daily and as needed. 250.00 100 each 1  . pantoprazole (PROTONIX) 40 MG tablet TAKE 2 TABLETS BY MOUTH  DAILY 180 tablet 3  . traZODone (DESYREL) 100 MG tablet Take 2 tablets (200 mg total) by mouth at bedtime. 30 tablet 0  . triamterene-hydrochlorothiazide (MAXZIDE-25) 37.5-25 MG tablet TAKE 1 TABLET BY MOUTH ONCE A DAY 90 tablet  3  . valACYclovir (VALTREX) 1000 MG tablet TAKE 2 TABLETS BY MOUTH  DAILY AS NEEDED (AT THE  ONSET OF SYMPTOMS AND TAKE  2 TABS 12 HOURS LATER) 90 tablet 0   No current facility-administered medications on file prior to visit.     Allergies  Allergen Reactions  . Lisinopril     REACTION: cough  . Nitrofurantoin Other (See Comments)    Pt unsure  . Oxycodone-Acetaminophen     REACTION: itching  . Atorvastatin Diarrhea    May have caused colitis--but not clear cut  . Clarithromycin Palpitations  . Glipizide Other (See Comments)    Sugars too variable    Past Medical History:  Diagnosis Date  . Allergy   . Asthma   . Depression   . Diabetes mellitus 11/12  . GERD (gastroesophageal reflux disease)   . Glaucoma suspect   . Hyperlipidemia   . Hypertension   . Mitral valve prolapse   . Osteopenia   . Shingles 11/2006  . Sleep disturbance   . Tachycardia     Past Surgical History:  Procedure Laterality Date  . ABDOMINAL HYSTERECTOMY  2000   /BSO - abn. Paps  . APPENDECTOMY  1978  . AUGMENTATION MAMMAPLASTY Bilateral 1981  . BLADDER REPAIR     x2  . BREAST ENHANCEMENT SURGERY  1987  . CARD[facility]AC [facility]TERIZATION  [facility]  . CARDIAC CATHETERIZATION Left 12/18/2016   Procedure: Left Heart Cath and Coronary Angiography;  Surgeon: Wellington Hampshire, MD;  Location: [facility];  Service: Cardiovascular;  Laterality: Left;  . CERVICAL DISCECTOMY  11/2009   with synthetic discs inserted--  Dr Arnoldo Morale  . NM MYOVIEW LTD  03/2005   EF 56%, no ischemia  . RECTAL PROLAPSE REPAIR     x1  . SHOULDER SURGERY  09/2008, 01/2009   Right shoulder surgery, then redone with lysis of adhesions    Family History  Problem Relation Age of Onset  . Diabetes Mother   . Heart disease Mother   . Hypertension Mother   . Ovarian cancer Mother   . Melanoma Mother   . Cancer Father   . Coronary artery disease Father   . Heart disease Father   . Diabetes Sister   . Depression Sister     . Coronary artery disease Sister   . Heart disease Sister   . Rheum arthritis Maternal Aunt   . Breast cancer Maternal Grandmother   . Diabetes Maternal Grandmother   . Heart attack Paternal Uncle   . Heart attack Cousin   . Heart attack Paternal Uncle   . Diabetes Maternal Grandfather   . Colon cancer Neg Hx     Social History   Social History  . Marital status: Married    Spouse name: N/A  . Number of children: 5  . Years of education: N/A   Occupational History  . homemaker    Social History Main Topics  . Smoking status: Never Smoker  . Smokeless tobacco: Never Used  . Alcohol use No  . Drug use: No  . Sexual activity: Not on file   Other Topics Concern  . Not on file   Social History Narrative   Home maker   Doesn't exercise      Shannon [name] sister         Review of Systems Needs new meter Sleep problems--some better recently Weight stable    Objective:   Physical Exam  Constitutional: She appears well-developed. No distress.  HENT:  Small cystic area on right tonsil. No exudate or inflammation (recommended ENT if worsens or persists)  Neck: No thyromegaly present.  Cardiovascular: Normal rate, regular rhythm, normal heart sounds and intact distal pulses.  Exam reveals no gallop.   No murmur heard. Pulmonary/Chest: Effort normal and breath sounds normal. No respiratory distress. She has no wheezes. She has no rales.  Lymphadenopathy:    She has no cervical adenopathy.          Assessment & Plan:

## 2017-09-19 NOTE — Assessment & Plan Note (Addendum)
Hopefully better but not checking Intolerant of higher metformin, glipizide and lantus Will need to try something else if still over 8%--would try low dose [place] if she can afford

## 2017-11-28 DIAGNOSIS — H401131 Primary open-angle glaucoma, bilateral, mild stage: Secondary | ICD-10-CM | POA: Diagnosis not present

## 2017-12-03 DIAGNOSIS — H401131 Primary open-angle glaucoma, bilateral, mild stage: Secondary | ICD-10-CM | POA: Diagnosis not present

## 2017-12-03 LAB — HM DIABETES EYE EXAM

## 2017-12-04 ENCOUNTER — Encounter: Payer: Self-pay | Admitting: Internal Medicine

## 2017-12-20 ENCOUNTER — Other Ambulatory Visit: Payer: Self-pay | Admitting: Internal Medicine

## 2017-12-25 ENCOUNTER — Telehealth: Payer: Self-pay

## 2017-12-25 NOTE — Telephone Encounter (Signed)
[facility] is calling to confirm if we received medical records they faxed Please call [phone] Reference [phone]

## 2017-12-26 NOTE — Telephone Encounter (Signed)
S/w patient. She is aware that [employer] is requesting medical records. 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 I see where Dr. Alla German office received the request but we have not at this time. Encouraged patient to come in our office and could sign a release form and provide place to send information. She said she would do so.

## 2017-12-27 ENCOUNTER — Telehealth: Payer: Self-pay | Admitting: Cardiology

## 2017-12-27 NOTE — Telephone Encounter (Signed)
Received records request from [facility] , forwarded to [facility] for processing

## 2017-12-31 DIAGNOSIS — M542 Cervicalgia: Secondary | ICD-10-CM | POA: Diagnosis not present

## 2017-12-31 DIAGNOSIS — M25512 Pain in left shoulder: Secondary | ICD-10-CM | POA: Diagnosis not present

## 2017-12-31 DIAGNOSIS — M5441 Lumbago with sciatica, right side: Secondary | ICD-10-CM | POA: Diagnosis not present

## 2018-01-14 DIAGNOSIS — M7542 Impingement syndrome of left shoulder: Secondary | ICD-10-CM | POA: Diagnosis not present

## 2018-01-14 DIAGNOSIS — M25512 Pain in left shoulder: Secondary | ICD-10-CM | POA: Diagnosis not present

## 2018-01-14 DIAGNOSIS — M25511 Pain in right shoulder: Secondary | ICD-10-CM | POA: Diagnosis not present

## 2018-02-13 ENCOUNTER — Encounter: Payer: Self-pay | Admitting: Internal Medicine

## 2018-02-13 ENCOUNTER — Ambulatory Visit: Payer: 59 | Admitting: Internal Medicine

## 2018-02-13 VITALS — BP 108/76 | HR 90 | Temp 98.4°F | Ht 65.0 in | Wt 149.0 lb

## 2018-02-13 DIAGNOSIS — R5383 Other fatigue: Secondary | ICD-10-CM

## 2018-02-13 DIAGNOSIS — E1149 Type 2 diabetes mellitus with other diabetic neurological complication: Secondary | ICD-10-CM

## 2018-02-13 DIAGNOSIS — R110 Nausea: Secondary | ICD-10-CM | POA: Diagnosis not present

## 2018-02-13 LAB — CBC
HEMATOCRIT: 41.9 % (ref 36.0–46.0)
HEMOGLOBIN: 14.1 g/dL (ref 12.0–15.0)
MCHC: 33.7 g/dL (ref 30.0–36.0)
MCV: 85.7 fl (ref 78.0–100.0)
PLATELETS: 327 10*3/uL (ref 150.0–400.0)
RBC: 4.89 Mil/uL (ref 3.87–5.11)
RDW: 13.7 % (ref 11.5–15.5)
WBC: 8.2 10*3/uL (ref 4.0–10.5)

## 2018-02-13 LAB — COMPREHENSIVE METABOLIC PANEL
ALT: 25 U/L (ref 0–35)
AST: 17 U/L (ref 0–37)
Albumin: 4.5 g/dL (ref 3.5–5.2)
Alkaline Phosphatase: 76 U/L (ref 39–117)
BUN: 11 mg/dL (ref 6–23)
CO2: 30 mEq/L (ref 19–32)
Calcium: 10.4 mg/dL (ref 8.4–10.5)
Chloride: 98 mEq/L (ref 96–112)
Creatinine, Ser: 0.87 mg/dL (ref 0.40–1.20)
GFR: 70.59 mL/min (ref >60.00)
GLUCOSE: 151 mg/dL — AB (ref 70–99)
POTASSIUM: 4.6 meq/L (ref 3.5–5.1)
SODIUM: 137 meq/L (ref 135–145)
Total Bilirubin: 0.4 mg/dL (ref 0.2–1.2)
Total Protein: 7.5 g/dL (ref 6.0–8.3)

## 2018-02-13 LAB — LIPID PANEL
CHOL/HDL RATIO: 4
Cholesterol: 242 mg/dL — ABNORMAL HIGH (ref 0–200)
HDL: 55.6 mg/dL (ref >39.00)
NONHDL: 186.54
TRIGLYCERIDES: 265 mg/dL — AB (ref 0.0–149.0)
VLDL: 53 mg/dL — AB (ref 0.0–40.0)

## 2018-02-13 LAB — HEMOGLOBIN A1C: Hgb A1c MFr Bld: 7.9 % — ABNORMAL HIGH (ref 4.6–6.5)

## 2018-02-13 LAB — LDL CHOLESTEROL, DIRECT: Direct LDL: 159 mg/dL

## 2018-02-13 LAB — VITAMIN B12: Vitamin B-12: 256 pg/mL (ref 211–911)

## 2018-02-13 MED ORDER — PANTOPRAZOLE SODIUM 40 MG PO TBEC: 40.0000 mg | DELAYED_RELEASE_TABLET | Freq: Two times a day (BID) | ORAL | 3 refills | Status: DC

## 2018-02-13 NOTE — Assessment & Plan Note (Signed)
Likely from poor appetite and not eating well No worrisome PE findings---will check labs

## 2018-02-13 NOTE — Progress Notes (Signed)
Subjective:    Patient ID: Shannon [name], female    DOB: [birth year], 62 y.o.   MRN: [id number]  HPI Here due to nausea  Having nausea and poor appetite Has to make herself eat Mild weight loss No energy--- "just not myself" Especially noticeable for 2 months  No clear cause Has had night heartburn lately--- still on protonix every morning  Has vomited 3 times in those months No dysphagia  Had round of steroid due to neck and shoulders Cortisone shot in shoulder Sugars elevated then due to this  Current Outpatient Medications on File Prior to Visit  Medication Sig Dispense Refill  . aspirin EC 81 MG tablet Take 1 tablet (81 mg total) by mouth daily. 90 tablet 3  . aspirin-acetaminophen-caffeine (EXCEDRIN MIGRAINE) 250-250-65 MG tablet Take 1 tablet by mouth every 6 (six) hours as needed for headache.    . estradiol (ESTRACE) 0.1 MG/GM vaginal cream Place 1 Applicatorful vaginally 3 (three) times a week. 42.5 g 12  . glucose blood (ONE TOUCH ULTRA TEST) test strip USE ONE STRIP TO CHECK GLUCOSE ONCE DAILY AND AS NEEDED 100 each 3  . LUMIGAN 0.01 % SOLN Place 1 drop into both eyes at bedtime.     . metFORMIN (GLUCOPHAGE) 1000 MG tablet Take 1 tablet (1,000 mg total) by mouth 2 (two) times daily with a meal. (Patient taking differently: Take 1,000 mg by mouth daily with breakfast. ) 180 tablet 3  . metoprolol succinate (TOPROL-XL) 25 MG 24 hr tablet Take 1.5 tablets (37.5 mg total) by mouth daily. 135 tablet 3  . ondansetron (ZOFRAN) 4 MG tablet TAKE 1 TABLET BY MOUTH THREE TIMES DAILY AS NEEDED FOR NAUSEA OR VOMITING 60 tablet 2  . ONETOUCH DELICA LANCETS FINE MISC Check blood sugar once daily and as needed. 250.00 100 each 1  . pantoprazole (PROTONIX) 40 MG tablet TAKE 2 TABLETS BY MOUTH  DAILY 180 tablet 3  . traZODone (DESYREL) 100 MG tablet Take 2 tablets (200 mg total) by mouth at bedtime. 30 tablet 0  . triamterene-hydrochlorothiazide (MAXZIDE-25) 37.5-25 MG tablet TAKE 1  TABLET BY MOUTH ONCE A DAY 90 tablet 3  . valACYclovir (VALTREX) 1000 MG tablet TAKE 2 TABLETS BY MOUTH  DAILY AS NEEDED (AT THE  ONSET OF SYMPTOMS AND TAKE  2 TABS 12 HOURS LATER) 90 tablet 0   No current facility-administered medications on file prior to visit.     Allergies  Allergen Reactions  . Lisinopril     REACTION: cough  . Nitrofurantoin Other (See Comments)    Pt unsure  . Oxycodone-Acetaminophen     REACTION: itching  . Atorvastatin Diarrhea    May have caused colitis--but not clear cut  . Clarithromycin Palpitations  . Glipizide Other (See Comments)    Sugars too variable    Past Medical History:  Diagnosis Date  . Allergy   . Asthma   . Depression   . Diabetes mellitus 11/12  . GERD (gastroesophageal reflux disease)   . Glaucoma suspect   . Hyperlipidemia   . Hypertension   . Mitral valve prolapse   . Osteopenia   . Shingles 11/2006  . Sleep disturbance   . Tachycardia     Past Surgical History:  Procedure Laterality Date  . ABDOMINAL HYSTERECTOMY  2000   /BSO - abn. [facility]  . APPENDECTOMY  1978  . AUGMENTATION MAMMAPLASTY Bilateral 1981  . BLADDER REPAIR     x2  . BREAST ENHANCEMENT SURGERY  1987  .  [place] Left 12/18/2016   Procedure: Left Heart Cath and Coronary Angiography;  Surgeon: Wellington Hampshire, MD;  Location: [facility];  Service: Cardiovascular;  Laterality: Left;  . CERVICAL DISCECTOMY  11/2009   with synthetic discs inserted--  Dr Arnoldo Morale  . NM MYOVIEW LTD  03/2005   EF 56%, no ischemia  . RECTAL PROLAPSE REPAIR     x1  . SHOULDER SURGERY  09/2008, 01/2009   Right shoulder surgery, then redone with lysis of adhesions    Family History  Problem Relation Age of Onset  . Diabetes Mother   . Heart disease Mother   . Hypertension Mother   . Ovarian cancer Mother   . Melanoma Mother   . Cancer Father   . Coronary artery disease Father   . Heart disease Father   .  Diabetes Sister   . Depression Sister   . Coronary artery disease Sister   . Heart disease Sister   . Rheum arthritis Maternal Aunt   . Breast cancer Maternal Grandmother   . Diabetes Maternal Grandmother   . Heart attack Paternal Uncle   . Heart attack Cousin   . Heart attack Paternal Uncle   . Diabetes Maternal Grandfather   . Colon cancer Neg Hx     Social History   Socioeconomic History  . Marital status: Married    Spouse name: Not on file  . Number of children: 5  . Years of education: Not on file  . Highest education level: Not on file  Occupational History  . Occupation: homemaker  Social Needs  . Financial resource strain: Not on file  . Food insecurity:    Worry: Not on file    Inability: Not on file  . Transportation needs:    Medical: Not on file    Non-medical: Not on file  Tobacco Use  . Smoking status: Never Smoker  . Smokeless tobacco: Never Used  Substance and Sexual Activity  . Alcohol use: No  . Drug use: No  . Sexual activity: Not on file  Lifestyle  . Physical activity:    Days per week: Not on file    Minutes per session: Not on file  . Stress: Not on file  Relationships  . Social connections:    Talks on phone: Not on file    Gets together: Not on file    Attends religious service: Not on file    Active member of club or organization: Not on file    Attends meetings of clubs or organizations: Not on file    Relationship status: Not on file  . Intimate partner violence:    Fear of current or ex partner: Not on file    Emotionally abused: Not on file    Physically abused: Not on file    Forced sexual activity: Not on file  Other Topics Concern  . Not on file  Social History Narrative   Home maker   Doesn't exercise      Shannon [name] sister         Review of Systems Bowels are "more sluggish"--- takes 3 stool softeners daily No blood No abdominal pain or colitis symptoms like before No increased gas or burping Same  metformin dose No cough or SOB Struggling with allergies--discussed options    Objective:   Physical Exam  Constitutional: She appears well-developed. No distress.  Neck: No thyromegaly present.  Cardiovascular: Normal rate,  regular rhythm and normal heart sounds. Exam reveals no gallop.  No murmur heard. Pulmonary/Chest: Effort normal and breath sounds normal. No respiratory distress. She has no wheezes. She has no rales.  Abdominal: Soft. Bowel sounds are normal. She exhibits no distension and no mass. There is no tenderness. There is no rebound and no guarding.  Musculoskeletal: She exhibits no edema.  Lymphadenopathy:    She has no cervical adenopathy.          Assessment & Plan:

## 2018-02-13 NOTE — Patient Instructions (Signed)
Please try the pantoprazole twice a day on an empty stomach. If your nausea continues, I would recommend trying 1 week off the metformin to see if that makes any difference. If the problems continue, I would like you to see Dr Vira Agar again.

## 2018-02-13 NOTE — Assessment & Plan Note (Signed)
Generally doing okay but may be up from recent steroid Rx and shot Check A1c Consider brief trial off metformin for the nausea (though unlikely to be cause)

## 2018-02-13 NOTE — Assessment & Plan Note (Signed)
Doesn't seem to be a medication reaction--like to metformin No new medications Does have evening heartburn, etc---so this is not unlikely cause Will try increasing protonix Consider trial of less metformin (unlikely to help) May want to have her see Dr Vira Agar if persists

## 2018-02-18 ENCOUNTER — Other Ambulatory Visit: Payer: Self-pay | Admitting: Internal Medicine

## 2018-03-06 ENCOUNTER — Ambulatory Visit: Payer: 59 | Admitting: Family Medicine

## 2018-03-06 ENCOUNTER — Encounter: Payer: Self-pay | Admitting: Family Medicine

## 2018-03-06 ENCOUNTER — Ambulatory Visit (INDEPENDENT_AMBULATORY_CARE_PROVIDER_SITE_OTHER)
Admission: RE | Admit: 2018-03-06 | Discharge: 2018-03-06 | Disposition: A | Discharge status: Home / Self Care | Payer: 59 | Source: Ambulatory Visit | Attending: Family Medicine | Admitting: Family Medicine

## 2018-03-06 ENCOUNTER — Other Ambulatory Visit: Payer: Self-pay

## 2018-03-06 VITALS — BP 120/76 | HR 108 | Temp 98.3°F | Ht 64.5 in | Wt 148.2 lb

## 2018-03-06 DIAGNOSIS — M7061 Trochanteric bursitis, right hip: Secondary | ICD-10-CM | POA: Diagnosis not present

## 2018-03-06 DIAGNOSIS — M25551 Pain in right hip: Secondary | ICD-10-CM

## 2018-03-06 DIAGNOSIS — M79644 Pain in right finger(s): Secondary | ICD-10-CM | POA: Diagnosis not present

## 2018-03-06 MED ORDER — METHYLPREDNISOLONE ACETATE 40 MG/ML IJ SUSP
80.0000 mg | Freq: Once | INTRAMUSCULAR | Status: AC
  Administered 2018-03-06: 80 mg via INTRA_ARTICULAR

## 2018-03-06 MED ORDER — METHYLPREDNISOLONE ACETATE 40 MG/ML IJ SUSP: 40.0000 mg | Freq: Once | INTRAMUSCULAR | Status: DC

## 2018-03-06 NOTE — Progress Notes (Signed)
Dr. Frederico Hamman T. Siena Poehler, MD, [facility] [place], [address] Phone: [phone] Fax: [phone]  03/06/2018  Patient: Shannon [name], MRN: [id number], DOB: 09-04-1956, 62 y.o.  Primary Physician:  Venia Carbon, MD   Chief Complaint  Patient presents with  . Hip Pain    Right x 1 month   Subjective:   Shannon [name] is a 62 y.o. very pleasantasant female patient who presents with the following:  R hip, hurts with lifting leg and felt a little knot the other day. Hard to get in and out of a car. Hard time getting in and out of toilet.  Very pleasant lady and this is primarily on the lateral aspect of the right hip.  Is been ongoing off and on for well more than a month, and it is pretty limiting to her right now.  She denies any groin pain.  She is not having much pain with rotational movements.  She has not had any specific injury that she can recall.  She overall is a fairly healthy lady, she does have some high blood pressure as well as diabetes.  Past Medical History, Surgical History, Social History, Family History, Problem List, Medications, and Allergies have been reviewed and updated if relevant.  Patient Active Problem List   Diagnosis Date Noted  . Nausea 02/13/2018  . Low back pain 06/18/2017  . Fatigue 04/20/2017  . Varicella zoster 07/10/2016  . 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 Hyperglycemia 08/09/2015  . Colitis 03/17/2014  . Routine general medical examination at a health care facility 09/16/2012  . Hyperlipidemia   . Type 2 diabetes mellitus with neurological manifestations, controlled ([facility])   . FIBROCYSTIC BREAST DISEASE 06/12/2007  . GLAUCOMA 05/30/2007  . Essential hypertension, benign 05/30/2007  . MITRAL VALVE PROLAPSE 05/30/2007  . ASTHMA 05/30/2007  . OSTEOPENIA 05/30/2007  . ALLERGIC RHINITIS 03/20/2007  . GERD 03/20/2007  . Sleep disturbance 03/20/2007    Past Medical History:    Diagnosis Date  . Allergy   . Asthma   . Depression   . Diabetes mellitus 11/12  . GERD (gastroesophageal reflux disease)   . Glaucoma suspect   . Hyperlipidemia   . Hypertension   . Mitral valve prolapse   . Osteopenia   . Shingles 11/2006  . Sleep disturbance   . Tachycardia     Past Surgical History:  Procedure Laterality Date  . ABDOMINAL HYSTERECTOMY  2000   /BSO - abn. [facility]  . APPENDECTOMY  1978  . AUGMENTATION MAMMAPLASTY Bilateral 1981  . BLADDER REPAIR     x2  . BREAST ENHANCEMENT SURGERY  1987  . [place] Left 12/18/2016   Procedure: Left Heart Cath and Coronary Angiography;  Surgeon: Wellington Hampshire, MD;  Location: [facility];  Service: Cardiovascular;  Laterality: Left;  . CERVICAL DISCECTOMY  11/2009   with synthetic discs inserted--  Dr Arnoldo Morale  . NM MYOVIEW LTD  03/2005   EF 56%, no ischemia  . RECTAL PROLAPSE REPAIR     x1  . SHOULDER SURGERY  09/2008, 01/2009   Right shoulder surgery, then redone with lysis of adhesions    Social History   Socioeconomic History  . Marital status: Married    Spouse name: Not on file  . Number of children: 5  . Years of education: Not on file  . Highest education level:  Not on file  Occupational History  . Occupation: homemaker  Social Needs  . Financial resource strain: Not on file  . Food insecurity:    Worry: Not on file    Inability: Not on file  . Transportation needs:    Medical: Not on file    Non-medical: Not on file  Tobacco Use  . Smoking status: Never Smoker  . Smokeless tobacco: Never Used  Substance and Sexual Activity  . Alcohol use: No  . Drug use: No  . Sexual activity: Not on file  Lifestyle  . Physical activity:    Days per week: Not on file    Minutes per session: Not on file  . Stress: Not on file  Relationships  . Social connections:    Talks on phone: Not on file    Gets together: Not on file    Attends religious  service: Not on file    Active member of club or organization: Not on file    Attends meetings of clubs or organizations: Not on file    Relationship status: Not on file  . Intimate partner violence:    Fear of current or ex partner: Not on file    Emotionally abused: Not on file    Physically abused: Not on file    Forced sexual activity: Not on file  Other Topics Concern  . Not on file  Social History Narrative   Home maker   Doesn't exercise      [name] sister          Family History  Problem Relation Age of Onset  . Diabetes Mother   . Heart disease Mother   . Hypertension Mother   . Ovarian cancer Mother   . Melanoma Mother   . Cancer Father   . Coronary artery disease Father   . Heart disease Father   . Diabetes Sister   . Depression Sister   . Coronary artery disease Sister   . Heart disease Sister   . Rheum arthritis Maternal Aunt   . Breast cancer Maternal Grandmother   . Diabetes Maternal Grandmother   . Heart attack Paternal Uncle   . Heart attack Cousin   . Heart attack Paternal Uncle   . Diabetes Maternal Grandfather   . Colon cancer Neg Hx     Allergies  Allergen Reactions  . Lisinopril     REACTION: cough  . Nitrofurantoin Other (See Comments)    Pt unsure  . Oxycodone-Acetaminophen     REACTION: itching  . Atorvastatin Diarrhea    May have caused colitis--but not clear cut  . Clarithromycin Palpitations  . Glipizide Other (See Comments)    Sugars too variable    Medication list reviewed and updated in full in [facility].  GEN: No fevers, chills. Nontoxic. Primarily MSK c/o today. MSK: Detailed in the HPI GI: tolerating PO intake without difficulty Neuro: No numbness, parasthesias, or tingling associated. Otherwise the pertinent positives of the ROS are noted above.   Objective:   BP 120/76   Pulse (!) 108   Temp 98.3 F (36.8 C) (Oral)   Ht 5' 4.5" (1.638 m)   Wt 148 lb 4 oz (67.2 kg)   BMI 25.05 kg/m     GEN: WDWN, NAD, Non-toxic, Alert & Oriented x 3 HEENT: Atraumatic, Normocephalic.  Ears and Nose: No external deformity. EXTR: No clubbing/cyanosis/edema NEURO: Normal gait.  PSYCH: Normally interactive. Conversant. Not depressed or anxious appearing.  Calm demeanor.  HIP EXAM: SIDE: R ROM: Abduction, Flexion, Internal and External range of motion: full Pain with terminal IROM and EROM: minimal GTB: dramatic R GTB SLR: NEG Knees: No effusion FABER: NT REVERSE FABER: NT, neg Piriformis: NT at direct palpation Str: flexion: 4+/5 abduction: 4+/5 adduction: 4+/5 Strength testing non-tender  Radiology: Dg Hip Unilat With Pelvis 2-3 Views Right  Result Date: 03/07/2018 CLINICAL DATA:  Right hip pain with radicular symptoms EXAM: DG HIP (WITH OR WITHOUT PELVIS) 2-3V RIGHT COMPARISON:  None. FINDINGS: Weightbearing frontal pelvis and weight-bearing frontal and lateral right hip images were obtained. No fracture or dislocation. Joint spaces appear normal. No erosive change. IMPRESSION: No fracture or dislocation.  No appreciable arthropathic change. Electronically Signed   By: Lowella Grip III M.D.   On: 03/07/2018 08:02     Assessment and Plan:   Trochanteric bursitis, right hip  Right hip pain - Plan: DG HIP UNILAT WITH PELVIS 2-3 VIEWS RIGHT, methylPREDNISolone acetate (DEPO-MEDROL) injection 80 mg, DISCONTINUED: methylPREDNISolone acetate (DEPO-MEDROL) injection 40 mg  Pretty classic trochanteric bursitis without any obvious intra-articular pathology.  Good range of motion and fair strength.  I reviewed with her some strength training that she can do for her hip and flexibility with her hip, which I think will likely make this less likely to come back in the future.  Trochanteric Bursitis Injection, R Verbal consent obtained. Risks (including infection, potential atrophy), benefits, and alternatives reviewed. Greater trochanter sterilely prepped with Chloraprep. Ethyl  Chloride used for anesthesia. 8 cc of Lidocaine 1% injected with 2 mL of Depo-Medrol 40 mg into trochanteric bursa at area of maximal tenderness at greater trochanter. Needle taken to bone to troch bursa, flows easily. Bursa massaged. No bleeding and no complications. Decreased pain after injection. Needle: 22 gauge spinal needle   Follow-up: only if needed  Meds ordered this encounter  Medications  . DISCONTD: methylPREDNISolone acetate (DEPO-MEDROL) injection 40 mg  . methylPREDNISolone acetate (DEPO-MEDROL) injection 80 mg   Orders Placed This Encounter  Procedures  . DG HIP UNILAT WITH PELVIS 2-3 VIEWS RIGHT    Signed,  Deanda Ruddell T. Duel Conrad, MD   Allergies as of 03/06/2018      Reactions   Lisinopril    REACTION: cough   Nitrofurantoin Other (See Comments)   Pt unsure   Oxycodone-acetaminophen    REACTION: itching   Atorvastatin Diarrhea   May have caused colitis--but not clear cut   Clarithromycin Palpitations   Glipizide Other (See Comments)   Sugars too variable      Medication List        Accurate as of 03/06/18 11:59 PM. Always use your most recent med list.          aspirin EC 81 MG tablet Take 1 tablet (81 mg total) by mouth daily.   aspirin-acetaminophen-caffeine 250-250-65 MG tablet Commonly known as:  EXCEDRIN MIGRAINE Take 1 tablet by mouth every 6 (six) hours as needed for headache.   estradiol 0.1 MG/GM vaginal cream Commonly known as:  ESTRACE Place 1 Applicatorful vaginally 3 (three) times a week.   glucose blood test strip Commonly known as:  ONE TOUCH ULTRA TEST USE ONE STRIP TO CHECK GLUCOSE ONCE DAILY AND AS NEEDED   LUMIGAN 0.01 % Soln Generic drug:  bimatoprost Place 1 drop into both eyes at bedtime.   metFORMIN 1000 MG tablet Commonly known as:  GLUCOPHAGE Take 1 tablet (1,000 mg total) by mouth daily with breakfast.   metoprolol succinate 25 MG 24 hr tablet  Commonly known as:  TOPROL-XL Take 1.5 tablets (37.5 mg total) by  mouth daily.   ondansetron 4 MG tablet Commonly known as:  ZOFRAN TAKE 1 TABLET BY MOUTH THREE TIMES DAILY AS NEEDED FOR NAUSEA OR VOMITING   ONETOUCH DELICA LANCETS FINE Misc Check blood sugar once daily and as needed. 250.00   pantoprazole 40 MG tablet Commonly known as:  PROTONIX Take 1 tablet (40 mg total) by mouth 2 (two) times daily. On an empty stomach   traZODone 100 MG tablet Commonly known as:  DESYREL Take 2 tablets (200 mg total) by mouth at bedtime.   triamterene-hydrochlorothiazide 37.5-25 MG tablet Commonly known as:  MAXZIDE-25 TAKE 1 TABLET BY MOUTH ONCE A DAY   valACYclovir 1000 MG tablet Commonly known as:  VALTREX TAKE 2 TABLETS BY MOUTH  DAILY AS NEEDED (AT THE  ONSET OF SYMPTOMS AND TAKE  2 TABS 12 HOURS LATER)

## 2018-03-13 ENCOUNTER — Telehealth: Payer: Self-pay | Admitting: Family Medicine

## 2018-03-13 NOTE — Telephone Encounter (Signed)
Pt seen 03/06/18.Please advise.

## 2018-03-13 NOTE — Telephone Encounter (Signed)
I am happy to send her to another doctor to get a different opinion - she has seen both [facility] and [facility] in the past.   Her x-rays were pristine without arthritis. MRI is normally not used with this issue.

## 2018-03-13 NOTE — Telephone Encounter (Signed)
Copied from [place]. Topic: Quick Communication - See Telephone Encounter >> Mar 13, 2018  3:37 PM Margot Ables wrote: CRM for notification. See Telephone encounter for: 03/13/18. Pt states that she is not feeling any better. She is asking if she needs to have a scan or other testing done for pain in R hip. This has been hurting for over a month. Please call pt to advise.

## 2018-03-13 NOTE — Telephone Encounter (Signed)
I saw her 1 week ago only. Did she have any improvement from her hip injection?  I would start off with formal physical therapy to work on strength imbalances in the hip - correcting this will usually help more than anything for the long-term.

## 2018-03-13 NOTE — Telephone Encounter (Signed)
Mrs. Shannon [name] notified as instructed by telephone.  She states the injection helped some but it is back to hurting all through the night.  She is unable to lay on that side.  She is having trouble getting up and getting out of the car.  She states she has been doing the home exercises.  She states there is a spur in there as well and she is afraid we are missing something.  She declines PT at this time.  I advised I would send note back to Dr. Lorelei Pont to see if her wants to do more advance imagining or refer her to an [facility].  Please advise.

## 2018-03-13 NOTE — Telephone Encounter (Signed)
Left message for Mrs. Shean that Dr. Lorelei Pont would be happy to refer her to an orthopedist for a second opinion.  She has been seen at [facility] and [facility].  I ask that she call us back on Thursday to let us know if she has a preference of where she would like to be referred to.  Ok for [facility] to get information from patient when she calls back.

## 2018-03-18 ENCOUNTER — Other Ambulatory Visit: Payer: Self-pay | Admitting: Family Medicine

## 2018-03-18 DIAGNOSIS — M25551 Pain in right hip: Secondary | ICD-10-CM

## 2018-03-18 NOTE — Telephone Encounter (Signed)
done

## 2018-03-18 NOTE — Progress Notes (Signed)
done

## 2018-03-18 NOTE — Telephone Encounter (Signed)
Copied from [place] [id number]. Topic: Quick Communication - Office Called Patient >> Mar 13, 2018  5:26 PM Carter Kitten, CMA wrote: Reason for CRM: When patient calls back, please find out if she has a preference to which orthopedist she would like to be referred to.  OK to [facility] to get this information when patient calls back. >> Mar 18, 2018 10:52 AM Oliver Pila B wrote: Pt called back and states she does not have a preference and wants to go anywhere w/ a good physician b/c pt states she is pain, call back if needed

## 2018-03-20 DIAGNOSIS — M7061 Trochanteric bursitis, right hip: Secondary | ICD-10-CM | POA: Diagnosis not present

## 2018-03-20 DIAGNOSIS — M5416 Radiculopathy, lumbar region: Secondary | ICD-10-CM | POA: Diagnosis not present

## 2018-03-21 ENCOUNTER — Other Ambulatory Visit: Payer: Self-pay | Admitting: Internal Medicine

## 2018-04-01 DIAGNOSIS — M25551 Pain in right hip: Secondary | ICD-10-CM | POA: Diagnosis not present

## 2018-04-01 DIAGNOSIS — M4726 Other spondylosis with radiculopathy, lumbar region: Secondary | ICD-10-CM | POA: Diagnosis not present

## 2018-04-01 DIAGNOSIS — M47816 Spondylosis without myelopathy or radiculopathy, lumbar region: Secondary | ICD-10-CM | POA: Diagnosis not present

## 2018-04-03 DIAGNOSIS — M5416 Radiculopathy, lumbar region: Secondary | ICD-10-CM | POA: Diagnosis not present

## 2018-04-10 DIAGNOSIS — M5416 Radiculopathy, lumbar region: Secondary | ICD-10-CM | POA: Diagnosis not present

## 2018-04-17 DIAGNOSIS — M5416 Radiculopathy, lumbar region: Secondary | ICD-10-CM | POA: Diagnosis not present

## 2018-04-22 DIAGNOSIS — M25551 Pain in right hip: Secondary | ICD-10-CM | POA: Diagnosis not present

## 2018-04-24 DIAGNOSIS — M1611 Unilateral primary osteoarthritis, right hip: Secondary | ICD-10-CM | POA: Diagnosis not present

## 2018-04-26 DIAGNOSIS — M1611 Unilateral primary osteoarthritis, right hip: Secondary | ICD-10-CM | POA: Diagnosis not present

## 2018-04-30 DIAGNOSIS — M1611 Unilateral primary osteoarthritis, right hip: Secondary | ICD-10-CM | POA: Diagnosis not present

## 2018-05-01 ENCOUNTER — Ambulatory Visit: Payer: Self-pay | Admitting: Orthopedic Surgery

## 2018-05-08 DIAGNOSIS — M1611 Unilateral primary osteoarthritis, right hip: Secondary | ICD-10-CM | POA: Diagnosis not present

## 2018-05-15 ENCOUNTER — Encounter
Admission: RE | Admit: 2018-05-15 | Discharge: 2018-05-15 | Disposition: A | Discharge status: Home / Self Care | Payer: 59 | Source: Ambulatory Visit | Attending: Orthopedic Surgery | Admitting: Orthopedic Surgery

## 2018-05-15 ENCOUNTER — Other Ambulatory Visit: Payer: Self-pay

## 2018-05-15 DIAGNOSIS — E119 Type 2 diabetes mellitus without complications: Secondary | ICD-10-CM | POA: Diagnosis not present

## 2018-05-15 DIAGNOSIS — I10 Essential (primary) hypertension: Secondary | ICD-10-CM | POA: Diagnosis not present

## 2018-05-15 DIAGNOSIS — Z01818 Encounter for other preprocedural examination: Secondary | ICD-10-CM | POA: Insufficient documentation

## 2018-05-15 DIAGNOSIS — Z01812 Encounter for preprocedural laboratory examination: Secondary | ICD-10-CM | POA: Diagnosis not present

## 2018-05-15 DIAGNOSIS — Z22322 Carrier or suspected carrier of Methicillin resistant Staphylococcus aureus: Secondary | ICD-10-CM

## 2018-05-15 HISTORY — DX: Family history of other specified conditions: Z84.89

## 2018-05-15 HISTORY — DX: Headache, unspecified: R51.9

## 2018-05-15 HISTORY — DX: Headache: R51

## 2018-05-15 HISTORY — DX: Carrier or suspected carrier of methicillin resistant Staphylococcus aureus: Z22.322

## 2018-05-15 LAB — BASIC METABOLIC PANEL
Anion gap: 10 (ref 5–15)
BUN: 13 mg/dL (ref 6–20)
CO2: 25 mmol/L (ref 22–32)
CREATININE: 1.01 mg/dL — AB (ref 0.44–1.00)
Calcium: 9.2 mg/dL (ref 8.9–10.3)
Chloride: 102 mmol/L (ref 98–111)
GFR calc Af Amer: >60 mL/min (ref >60)
GFR calc non Af Amer: 59 mL/min — ABNORMAL LOW (ref >60)
Glucose, Bld: 228 mg/dL — ABNORMAL HIGH (ref 70–99)
POTASSIUM: 3.7 mmol/L (ref 3.5–5.1)
Sodium: 137 mmol/L (ref 135–145)

## 2018-05-15 LAB — CBC
HCT: 39 % (ref 35.0–47.0)
Hemoglobin: 13.3 g/dL (ref 12.0–16.0)
MCH: 29.7 pg (ref 26.0–34.0)
MCHC: 34.1 g/dL (ref 32.0–36.0)
MCV: 87.1 fL (ref 80.0–100.0)
PLATELETS: 257 10*3/uL (ref 150–440)
RBC: 4.48 MIL/uL (ref 3.80–5.20)
RDW: 13.7 % (ref 11.5–14.5)
WBC: 6.9 10*3/uL (ref 3.6–11.0)

## 2018-05-15 LAB — URINALYSIS, ROUTINE W REFLEX MICROSCOPIC
BACTERIA UA: NONE SEEN
BILIRUBIN URINE: NEGATIVE
Glucose, UA: >=500 mg/dL — AB
Ketones, ur: NEGATIVE mg/dL
LEUKOCYTES UA: NEGATIVE
NITRITE: NEGATIVE
PROTEIN: NEGATIVE mg/dL
Specific Gravity, Urine: 1.008 (ref 1.005–1.030)
Squamous Epithelial / LPF: NONE SEEN (ref 0–5)
WBC, UA: NONE SEEN WBC/hpf (ref 0–5)
pH: 7 (ref 5.0–8.0)

## 2018-05-15 LAB — PROTIME-INR
INR: 0.89
Prothrombin Time: 12 seconds (ref 11.4–15.2)

## 2018-05-15 LAB — APTT: aPTT: 38 seconds — ABNORMAL HIGH (ref 24–36)

## 2018-05-15 NOTE — Patient Instructions (Addendum)
Your procedure is scheduled on: Wednesday 05/29/18 Report to [facility]. To find out your arrival time please call [phone] between 1PM - 3PM on Tuesday 05/28/18.  Remember: Instructions that are not followed completely may result in serious medical risk, up to and including death, or upon the discretion of your surgeon and anesthesiologist your surgery may need to be rescheduled.     _X__ 1. Do not eat food after midnight the night before your procedure.                 No gum chewing or hard candies. You may drink clear liquids up to 2 hours                 before you are scheduled to arrive for your surgery- DO not drink clear                 liquids within 2 hours of the start of your surgery.                 Clear Liquids include:  water, apple juice without pulp, clear carbohydrate                 drink such as Clearfast or Gatorade, Black Coffee or Tea (Do not add                 anything to coffee or tea).  __X__2.  On the morning of surgery brush your teeth with toothpaste and water, you                 may rinse your mouth with mouthwash if you wish.  Do not swallow any              toothpaste of mouthwash.     _X__ 3.  No Alcohol for 24 hours before or after surgery.   _X__ 4.  Do Not Smoke or use e-cigarettes For 24 Hours Prior to Your Surgery.                 Do not use any chewable tobacco products for at least 6 hours prior to                 surgery.  ____  5.  Bring all medications with you on the day of surgery if instructed.   __X__  6.  Notify your doctor if there is any change in your medical condition      (cold, fever, infections).     Do not wear jewelry, make-up, hairpins, clips or nail polish. Do not wear lotions, powders, or perfumes.  Do not shave 48 hours prior to surgery. Men may shave face and neck. Do not bring valuables to the hospital.    [facility] is not responsible for any belongings or  valuables.  Contacts, dentures/partials or body piercings may not be worn into surgery. Bring a case for your contacts, glasses or hearing aids, a denture cup will be supplied. Leave your suitcase in the car. After surgery it may be brought to your room. For patients admitted to the hospital, discharge time is determined by your treatment team.   Patients discharged the day of surgery will not be allowed to drive home.   Please read over the following fact sheets that you were given:   MRSA Information  __X__ Take these medicines the morning of surgery with A SIP OF WATER:  1. CETIRIZINE  2. METOPROLOL  3. PANTOPRAZOLE  4.  5.  6.  ____ Fleet Enema (as directed)   __X__ Use CHG Soap/SAGE wipes as directed  ____ Use inhalers on the day of surgery  __X__ Stop metformin/Janumet/Farxiga 2 days prior to surgery    ____ Take 1/2 of usual insulin dose the night before surgery. No insulin the morning          of surgery.   ____ Stop Blood Thinners Coumadin/Plavix/Xarelto/Pleta/Pradaxa/Eliquis/Effient/Aspirin  on   Or contact your Surgeon, Cardiologist or Medical Doctor regarding  ability to stop your blood thinners  __X__ Stop Anti-inflammatories 7 days before surgery such as Advil, Ibuprofen, Motrin,  BC or Goodies Powder, Naprosyn, Naproxen, Aleve, Aspirin, EXCEDRIN    __X__ Stop all herbal supplements, fish oil or vitamin E until after surgery.    ____ Bring C-Pap to the hospital.   [place]

## 2018-05-16 LAB — SURGICAL PCR SCREEN
MRSA, PCR: POSITIVE — AB
Staphylococcus aureus: POSITIVE — AB

## 2018-05-20 ENCOUNTER — Ambulatory Visit: Payer: 59 | Admitting: Internal Medicine

## 2018-05-20 ENCOUNTER — Encounter: Payer: Self-pay | Admitting: Internal Medicine

## 2018-05-20 VITALS — BP 110/70 | HR 88 | Temp 98.4°F | Ht 65.0 in | Wt 149.0 lb

## 2018-05-20 DIAGNOSIS — Z01818 Encounter for other preprocedural examination: Secondary | ICD-10-CM | POA: Diagnosis not present

## 2018-05-20 DIAGNOSIS — R51 Headache: Secondary | ICD-10-CM

## 2018-05-20 DIAGNOSIS — E1149 Type 2 diabetes mellitus with other diabetic neurological complication: Secondary | ICD-10-CM

## 2018-05-20 DIAGNOSIS — R519 Headache, unspecified: Secondary | ICD-10-CM | POA: Insufficient documentation

## 2018-05-20 MED ORDER — BUTALBITAL-APAP-CAFFEINE 50-300-40 MG PO CAPS: 1.0000 | ORAL_CAPSULE | Freq: Every day | ORAL | 0 refills | Status: DC | PRN

## 2018-05-20 NOTE — Assessment & Plan Note (Signed)
Acceptable control on the metformin---would hold the day of surgery and restart ASAP after Only use insulin coverage if sugars over 200-250

## 2018-05-20 NOTE — Assessment & Plan Note (Signed)
Healthy  No cardiac conditions or CHF No illness or lung issues EKG last week normal Fairly low risk for this surgery--and seems to be medically optimized Post op DVT prophylaxis may depend on mobility---can use ASA and support stockings if up quickly

## 2018-05-20 NOTE — Progress Notes (Signed)
Subjective:    Patient ID: Shannon [name], female    DOB: 05/18/1956, 62 y.o.   MRN: [id number]  HPI Here for preoperative evaluation before planned right THR  Has had some months of pain Cortisone injection for possible bursitis---didn't help Seen at [facility]--- didn't think it was from back Had MRI --- severe osteoarthritis Some better now with intra-articular injections  Very hard to even get in and out car Can't sit on toilet without severe pain (up and down) Trouble even bending over  No recent fever or cough Some stuffiness--relates to sinus/allergies Headaches more frequent----exedrin migraine helps but can't take it pre-op  No chest pain No palpitations  No SOB Carries groceries, does laundry and cleaning--no change in tolerance for this exertion No edema  Current Outpatient Medications on File Prior to Visit  Medication Sig Dispense Refill  . aspirin EC 81 MG tablet Take 1 tablet (81 mg total) by mouth daily. 90 tablet 3  . aspirin-acetaminophen-caffeine (EXCEDRIN MIGRAINE) 250-250-65 MG tablet Take 1 tablet by mouth every 6 (six) hours as needed for headache.    . cetirizine (ZYRTEC) 10 MG tablet Take 10 mg by mouth daily.    Marland Kitchen docusate sodium (COLACE) 100 MG capsule Take 300 mg by mouth at bedtime.     Marland Kitchen glucose blood (ONE TOUCH ULTRA TEST) test strip USE ONE STRIP TO CHECK GLUCOSE ONCE DAILY AND AS NEEDED 100 each 3  . HYDROcodone-acetaminophen (NORCO/VICODIN) 5-325 MG tablet Take 1-2 tablets by mouth every 6 (six) hours as needed. For pain.  0  . LUMIGAN 0.01 % SOLN Place 1 drop into both eyes at bedtime.     . metFORMIN (GLUCOPHAGE) 1000 MG tablet Take 1 tablet (1,000 mg total) by mouth daily with breakfast. 90 tablet 3  . metoprolol succinate (TOPROL-XL) 25 MG 24 hr tablet Take 1.5 tablets (37.5 mg total) by mouth daily. 135 tablet 3  . ondansetron (ZOFRAN) 4 MG tablet TAKE 1 TABLET BY MOUTH THREE TIMES DAILY AS NEEDED FOR NAUSEA OR VOMITING 60 tablet 2  .  ONETOUCH DELICA LANCETS FINE MISC Check blood sugar once daily and as needed. 250.00 100 each 1  . pantoprazole (PROTONIX) 40 MG tablet Take 1 tablet (40 mg total) by mouth 2 (two) times daily. On an empty stomach 180 tablet 3  . traZODone (DESYREL) 100 MG tablet Take 2 tablets (200 mg total) by mouth at bedtime. 30 tablet 0  . triamterene-hydrochlorothiazide (MAXZIDE-25) 37.5-25 MG tablet TAKE 1 TABLET BY MOUTH ONCE A DAY 90 tablet 3  . valACYclovir (VALTREX) 1000 MG tablet TAKE 2 TABLETS BY MOUTH  DAILY AS NEEDED (AT THE  ONSET OF SYMPTOMS AND TAKE  2 TABS 12 HOURS LATER) (Patient taking differently: TAKE 2 TABLETS BY MOUTH  DAILY AS NEEDED (AT THE  ONSET OF SYMPTOMS AND TAKE  2 TABS 12 HOURS LATER) FOR FEVER BLISTERS.) 90 tablet 0   No current facility-administered medications on file prior to visit.     Allergies  Allergen Reactions  . Lisinopril Cough  . Nitrofurantoin Other (See Comments)    Pt unsure  . Oxycodone-Acetaminophen Itching  . Atorvastatin Diarrhea    May have caused colitis--but not clear cut  . Clarithromycin Palpitations  . Glipizide Other (See Comments)    Sugars too variable    Past Medical History:  Diagnosis Date  . Allergy   . Asthma   . Depression   . Diabetes mellitus 11/12  . Family history of adverse reaction to  anesthesia    PROBLEMS WITH BLOCKS  . GERD (gastroesophageal reflux disease)   . Glaucoma suspect   . Headache   . Hyperlipidemia   . Hypertension   . Mitral valve prolapse   . Osteopenia   . Shingles 11/2006  . Sleep disturbance   . Tachycardia     Past Surgical History:  Procedure Laterality Date  . ABDOMINAL HYSTERECTOMY  2000   /BSO - abn. [facility]  . APPENDECTOMY  1978  . AUGMENTATION MAMMAPLASTY Bilateral 1981  . BLADDER REPAIR     x2  . BREAST ENHANCEMENT SURGERY  1987  . [place] Left 12/18/2016   Procedure: Left Heart Cath and Coronary Angiography;  Surgeon: Wellington Hampshire, MD;  Location: [facility];  Service: Cardiovascular;  Laterality: Left;  . CARPAL TUNNEL RELEASE Right   . CERVICAL DISCECTOMY  11/2009   with synthetic discs inserted--  Dr Arnoldo Morale  . NM MYOVIEW LTD  03/2005   EF 56%, no ischemia  . RECTAL PROLAPSE REPAIR     x1  . SHOULDER SURGERY  09/2008, 01/2009   Right shoulder surgery, then redone with lysis of adhesions    Family History  Problem Relation Age of Onset  . Diabetes Mother   . Heart disease Mother   . Hypertension Mother   . Ovarian cancer Mother   . Melanoma Mother   . Cancer Father   . Coronary artery disease Father   . Heart disease Father   . Diabetes Sister   . Depression Sister   . Coronary artery disease Sister   . Heart disease Sister   . Rheum arthritis Maternal Aunt   . Breast cancer Maternal Grandmother   . Diabetes Maternal Grandmother   . Heart attack Paternal Uncle   . Heart attack Cousin   . Heart attack Paternal Uncle   . Diabetes Maternal Grandfather   . Colon cancer Neg Hx     Social History   Socioeconomic History  . Marital status: Married    Spouse name: Not on file  . Number of children: 5  . Years of education: Not on file  . Highest education level: Not on file  Occupational History  . Occupation: homemaker  Social Needs  . Financial resource strain: Not on file  . Food insecurity:    Worry: Not on file    Inability: Not on file  . Transportation needs:    Medical: Not on file    Non-medical: Not on file  Tobacco Use  . Smoking status: Never Smoker  . Smokeless tobacco: Never Used  Substance and Sexual Activity  . Alcohol use: No  . Drug use: No  . Sexual activity: Not on file  Lifestyle  . Physical activity:    Days per week: Not on file    Minutes per session: Not on file  . Stress: Not on file  Relationships  . Social connections:    Talks on phone: Not on file    Gets together: Not on file    Attends religious service: Not on file    Active member  of club or organization: Not on file    Attends meetings of clubs or organizations: Not on file    Relationship status: Not on file  . Intimate partner violence:    Fear of current or ex partner: Not on file    Emotionally abused: Not on file    Physically  abused: Not on file    Forced sexual activity: Not on file  Other Topics Concern  . Not on file  Social History Narrative   Home maker   Doesn't exercise      [name] sister         Review of Systems  Sleeps okay --other than with the hip pain Appetite is normal Checking sugars until recently (sister in hospital)--- were fine Has stable lymph node in left neck     Objective:   Physical Exam  Constitutional: She appears well-developed. No distress.  Neck: No thyromegaly present.  Firm but not hard (~1-1.5cm) left posterior cervical node No other nodes  Cardiovascular: Normal rate, regular rhythm and normal heart sounds. Exam reveals no gallop.  No murmur heard. Respiratory: Effort normal and breath sounds normal. No respiratory distress. She has no wheezes. She has no rales.  GI: Soft. She exhibits no distension. There is no tenderness. There is no rebound and no guarding.  Musculoskeletal: She exhibits no edema.  Marked pain with internal rotation of right hip  Psychiatric: She has a normal mood and affect. Her behavior is normal.           Assessment & Plan:

## 2018-05-20 NOTE — Assessment & Plan Note (Signed)
Migraine vs sinus type Will give a few fioricet for preop time

## 2018-05-24 DIAGNOSIS — H401131 Primary open-angle glaucoma, bilateral, mild stage: Secondary | ICD-10-CM | POA: Diagnosis not present

## 2018-05-29 ENCOUNTER — Encounter
Admission: RE | Disposition: A | Discharge status: Home / Self Care | Payer: Self-pay | Source: Home / Self Care | Attending: Orthopedic Surgery

## 2018-05-29 ENCOUNTER — Inpatient Hospital Stay
Admission: RE | Admit: 2018-05-29 | Discharge: 2018-05-31 | DRG: 470 | Disposition: A | Discharge status: Home / Self Care | Payer: 59 | Attending: Orthopedic Surgery | Admitting: Orthopedic Surgery

## 2018-05-29 ENCOUNTER — Inpatient Hospital Stay: Payer: 59

## 2018-05-29 ENCOUNTER — Inpatient Hospital Stay: Payer: 59 | Admitting: Certified Registered"

## 2018-05-29 ENCOUNTER — Encounter: Payer: Self-pay | Admitting: *Deleted

## 2018-05-29 ENCOUNTER — Other Ambulatory Visit: Payer: Self-pay

## 2018-05-29 DIAGNOSIS — Z885 Allergy status to narcotic agent status: Secondary | ICD-10-CM | POA: Diagnosis not present

## 2018-05-29 DIAGNOSIS — J45909 Unspecified asthma, uncomplicated: Secondary | ICD-10-CM | POA: Diagnosis present

## 2018-05-29 DIAGNOSIS — Z8249 Family history of ischemic heart disease and other diseases of the circulatory system: Secondary | ICD-10-CM

## 2018-05-29 DIAGNOSIS — M1611 Unilateral primary osteoarthritis, right hip: Secondary | ICD-10-CM | POA: Diagnosis not present

## 2018-05-29 DIAGNOSIS — E785 Hyperlipidemia, unspecified: Secondary | ICD-10-CM | POA: Diagnosis present

## 2018-05-29 DIAGNOSIS — Z7982 Long term (current) use of aspirin: Secondary | ICD-10-CM | POA: Diagnosis not present

## 2018-05-29 DIAGNOSIS — E7800 Pure hypercholesterolemia, unspecified: Secondary | ICD-10-CM | POA: Diagnosis present

## 2018-05-29 DIAGNOSIS — Z79899 Other long term (current) drug therapy: Secondary | ICD-10-CM | POA: Diagnosis not present

## 2018-05-29 DIAGNOSIS — Z8261 Family history of arthritis: Secondary | ICD-10-CM

## 2018-05-29 DIAGNOSIS — Z96641 Presence of right artificial hip joint: Secondary | ICD-10-CM

## 2018-05-29 DIAGNOSIS — Z833 Family history of diabetes mellitus: Secondary | ICD-10-CM

## 2018-05-29 DIAGNOSIS — E119 Type 2 diabetes mellitus without complications: Secondary | ICD-10-CM | POA: Diagnosis present

## 2018-05-29 DIAGNOSIS — F329 Major depressive disorder, single episode, unspecified: Secondary | ICD-10-CM | POA: Diagnosis present

## 2018-05-29 DIAGNOSIS — M8580 Other specified disorders of bone density and structure, unspecified site: Secondary | ICD-10-CM | POA: Diagnosis present

## 2018-05-29 DIAGNOSIS — I10 Essential (primary) hypertension: Secondary | ICD-10-CM | POA: Diagnosis present

## 2018-05-29 DIAGNOSIS — K219 Gastro-esophageal reflux disease without esophagitis: Secondary | ICD-10-CM | POA: Diagnosis present

## 2018-05-29 DIAGNOSIS — Z841 Family history of disorders of kidney and ureter: Secondary | ICD-10-CM

## 2018-05-29 DIAGNOSIS — Z888 Allergy status to other drugs, medicaments and biological substances status: Secondary | ICD-10-CM

## 2018-05-29 DIAGNOSIS — M25751 Osteophyte, right hip: Secondary | ICD-10-CM | POA: Diagnosis present

## 2018-05-29 DIAGNOSIS — G479 Sleep disorder, unspecified: Secondary | ICD-10-CM | POA: Diagnosis present

## 2018-05-29 DIAGNOSIS — Z419 Encounter for procedure for purposes other than remedying health state, unspecified: Secondary | ICD-10-CM

## 2018-05-29 DIAGNOSIS — Z471 Aftercare following joint replacement surgery: Secondary | ICD-10-CM | POA: Diagnosis not present

## 2018-05-29 DIAGNOSIS — Z7984 Long term (current) use of oral hypoglycemic drugs: Secondary | ICD-10-CM | POA: Diagnosis not present

## 2018-05-29 DIAGNOSIS — M1612 Unilateral primary osteoarthritis, left hip: Secondary | ICD-10-CM | POA: Diagnosis not present

## 2018-05-29 DIAGNOSIS — Z09 Encounter for follow-up examination after completed treatment for conditions other than malignant neoplasm: Secondary | ICD-10-CM

## 2018-05-29 HISTORY — PX: TOTAL HIP ARTHROPLASTY: SHX124

## 2018-05-29 LAB — ABO/RH: ABO/RH(D): O POS

## 2018-05-29 LAB — GLUCOSE, CAPILLARY
GLUCOSE-CAPILLARY: 158 mg/dL — AB (ref 70–99)
GLUCOSE-CAPILLARY: 318 mg/dL — AB (ref 70–99)
Glucose-Capillary: 151 mg/dL — ABNORMAL HIGH (ref 70–99)

## 2018-05-29 LAB — TYPE AND SCREEN
ABO/RH(D): O POS
Antibody Screen: NEGATIVE

## 2018-05-29 SURGERY — ARTHROPLASTY, HIP, TOTAL, ANTERIOR APPROACH
Anesthesia: General | Site: Hip | Laterality: Right | Wound class: Clean

## 2018-05-29 MED ORDER — MAGNESIUM CITRATE PO SOLN
1.0000 | Freq: Once | ORAL | Status: AC | PRN
  Administered 2018-05-30: 1 via ORAL
  Filled 2018-05-29: qty 296

## 2018-05-29 MED ORDER — ROCURONIUM BROMIDE 100 MG/10ML IV SOLN
INTRAVENOUS | Status: DC | PRN
  Administered 2018-05-29: 40 mg via INTRAVENOUS

## 2018-05-29 MED ORDER — METOCLOPRAMIDE HCL 5 MG/ML IJ SOLN: 5.0000 mg | Freq: Three times a day (TID) | INTRAMUSCULAR | Status: DC | PRN

## 2018-05-29 MED ORDER — HYDROCODONE-ACETAMINOPHEN 5-325 MG PO TABS
1.0000 | ORAL_TABLET | ORAL | Status: DC | PRN
  Administered 2018-05-29 (×2): 1 via ORAL
  Administered 2018-05-30 – 2018-05-31 (×5): 2 via ORAL
  Filled 2018-05-29: qty 1
  Filled 2018-05-29 (×2): qty 2
  Filled 2018-05-29: qty 1
  Filled 2018-05-29 (×3): qty 2

## 2018-05-29 MED ORDER — ONDANSETRON HCL 4 MG/2ML IJ SOLN
INTRAMUSCULAR | Status: DC | PRN
  Administered 2018-05-29: 4 mg via INTRAVENOUS

## 2018-05-29 MED ORDER — ONDANSETRON HCL 4 MG PO TABS: 4.0000 mg | ORAL_TABLET | Freq: Four times a day (QID) | ORAL | Status: DC | PRN

## 2018-05-29 MED ORDER — LACTATED RINGERS IV SOLN
INTRAVENOUS | Status: DC
  Administered 2018-05-29: 1000 mL via INTRAVENOUS

## 2018-05-29 MED ORDER — METOPROLOL SUCCINATE ER 25 MG PO TB24
37.5000 mg | ORAL_TABLET | Freq: Every day | ORAL | Status: DC
  Administered 2018-05-30 – 2018-05-31 (×2): 37.5 mg via ORAL
  Filled 2018-05-29 (×2): qty 2

## 2018-05-29 MED ORDER — TRIAMTERENE-HCTZ 37.5-25 MG PO TABS
1.0000 | ORAL_TABLET | Freq: Every day | ORAL | Status: DC
  Filled 2018-05-29 (×3): qty 1

## 2018-05-29 MED ORDER — KETOROLAC TROMETHAMINE 15 MG/ML IJ SOLN
15.0000 mg | Freq: Four times a day (QID) | INTRAMUSCULAR | Status: AC
  Administered 2018-05-29 – 2018-05-30 (×4): 15 mg via INTRAVENOUS
  Filled 2018-05-29 (×4): qty 1

## 2018-05-29 MED ORDER — BISACODYL 10 MG RE SUPP: 10.0000 mg | Freq: Every day | RECTAL | Status: DC | PRN

## 2018-05-29 MED ORDER — ACETAMINOPHEN 500 MG PO TABS
ORAL_TABLET | ORAL | Status: AC
  Filled 2018-05-29: qty 2

## 2018-05-29 MED ORDER — MIDAZOLAM HCL 2 MG/2ML IJ SOLN
INTRAMUSCULAR | Status: AC
  Filled 2018-05-29: qty 2

## 2018-05-29 MED ORDER — ONDANSETRON HCL 4 MG/2ML IJ SOLN
4.0000 mg | Freq: Once | INTRAMUSCULAR | Status: AC | PRN
  Administered 2018-05-29: 4 mg via INTRAVENOUS

## 2018-05-29 MED ORDER — MENTHOL 3 MG MT LOZG: 1.0000 | LOZENGE | OROMUCOSAL | Status: DC | PRN

## 2018-05-29 MED ORDER — PHENYLEPHRINE HCL 10 MG/ML IJ SOLN
INTRAMUSCULAR | Status: DC | PRN
  Administered 2018-05-29 (×2): 200 ug via INTRAVENOUS
  Administered 2018-05-29: 100 ug via INTRAVENOUS

## 2018-05-29 MED ORDER — HYDROCODONE-ACETAMINOPHEN 7.5-325 MG PO TABS: 1.0000 | ORAL_TABLET | ORAL | Status: DC | PRN

## 2018-05-29 MED ORDER — GABAPENTIN 300 MG PO CAPS
300.0000 mg | ORAL_CAPSULE | Freq: Three times a day (TID) | ORAL | Status: DC
  Administered 2018-05-29 – 2018-05-31 (×8): 300 mg via ORAL
  Filled 2018-05-29 (×8): qty 1

## 2018-05-29 MED ORDER — ACETAMINOPHEN 500 MG PO TABS
1000.0000 mg | ORAL_TABLET | Freq: Once | ORAL | Status: AC
  Administered 2018-05-29: 1000 mg via ORAL

## 2018-05-29 MED ORDER — FENTANYL CITRATE (PF) 100 MCG/2ML IJ SOLN
INTRAMUSCULAR | Status: AC
Start: 2018-05-29 — End: ?
  Filled 2018-05-29: qty 2

## 2018-05-29 MED ORDER — CEFAZOLIN SODIUM-DEXTROSE 2-4 GM/100ML-% IV SOLN
2.0000 g | INTRAVENOUS | Status: AC
  Administered 2018-05-29: 2 g via INTRAVENOUS

## 2018-05-29 MED ORDER — LATANOPROST 0.005 % OP SOLN
1.0000 [drp] | Freq: Every day | OPHTHALMIC | Status: DC
  Administered 2018-05-29 – 2018-05-30 (×2): 1 [drp] via OPHTHALMIC
  Filled 2018-05-29: qty 2.5

## 2018-05-29 MED ORDER — MIDAZOLAM HCL 5 MG/5ML IJ SOLN
INTRAMUSCULAR | Status: DC | PRN
  Administered 2018-05-29: 2 mg via INTRAVENOUS

## 2018-05-29 MED ORDER — METFORMIN HCL 500 MG PO TABS
1000.0000 mg | ORAL_TABLET | Freq: Every day | ORAL | Status: DC
  Administered 2018-05-30 – 2018-05-31 (×2): 1000 mg via ORAL
  Filled 2018-05-29 (×2): qty 2

## 2018-05-29 MED ORDER — GABAPENTIN 300 MG PO CAPS
ORAL_CAPSULE | ORAL | Status: AC
  Administered 2018-05-29: 300 mg
  Filled 2018-05-29: qty 1

## 2018-05-29 MED ORDER — ACETAMINOPHEN 325 MG PO TABS
325.0000 mg | ORAL_TABLET | Freq: Four times a day (QID) | ORAL | Status: DC | PRN
Start: 2018-05-30 — End: 2018-05-31
  Administered 2018-05-30: 325 mg via ORAL
  Administered 2018-05-31: 650 mg via ORAL
  Filled 2018-05-29 (×2): qty 2

## 2018-05-29 MED ORDER — DOCUSATE SODIUM 100 MG PO CAPS
100.0000 mg | ORAL_CAPSULE | Freq: Two times a day (BID) | ORAL | Status: DC
  Administered 2018-05-29 – 2018-05-31 (×5): 100 mg via ORAL
  Filled 2018-05-29 (×5): qty 1

## 2018-05-29 MED ORDER — ONDANSETRON HCL 4 MG/2ML IJ SOLN
4.0000 mg | Freq: Four times a day (QID) | INTRAMUSCULAR | Status: DC | PRN
  Administered 2018-05-30: 4 mg via INTRAVENOUS
  Filled 2018-05-29: qty 2

## 2018-05-29 MED ORDER — LACTATED RINGERS IV SOLN
INTRAVENOUS | Status: DC
  Administered 2018-05-29 (×2): via INTRAVENOUS

## 2018-05-29 MED ORDER — CEFAZOLIN SODIUM-DEXTROSE 1-4 GM/50ML-% IV SOLN
1.0000 g | Freq: Four times a day (QID) | INTRAVENOUS | Status: AC
  Administered 2018-05-29 (×2): 1 g via INTRAVENOUS
  Filled 2018-05-29 (×2): qty 50

## 2018-05-29 MED ORDER — PHENOL 1.4 % MT LIQD: 1.0000 | OROMUCOSAL | Status: DC | PRN

## 2018-05-29 MED ORDER — PROPOFOL 10 MG/ML IV BOLUS
INTRAVENOUS | Status: DC | PRN
  Administered 2018-05-29: 130 mg via INTRAVENOUS

## 2018-05-29 MED ORDER — BACITRACIN 50000 UNITS IM SOLR
INTRAMUSCULAR | Status: AC
  Filled 2018-05-29: qty 2

## 2018-05-29 MED ORDER — CEFAZOLIN SODIUM-DEXTROSE 2-4 GM/100ML-% IV SOLN
INTRAVENOUS | Status: AC
  Filled 2018-05-29: qty 100

## 2018-05-29 MED ORDER — FENTANYL CITRATE (PF) 100 MCG/2ML IJ SOLN: 25.0000 ug | INTRAMUSCULAR | Status: DC | PRN

## 2018-05-29 MED ORDER — DEXAMETHASONE SODIUM PHOSPHATE 10 MG/ML IJ SOLN
INTRAMUSCULAR | Status: DC | PRN
  Administered 2018-05-29: 6 mg via INTRAVENOUS

## 2018-05-29 MED ORDER — VANCOMYCIN HCL IN DEXTROSE 1-5 GM/200ML-% IV SOLN
1000.0000 mg | Freq: Once | INTRAVENOUS | Status: AC
  Administered 2018-05-29: 1000 mg via INTRAVENOUS

## 2018-05-29 MED ORDER — CHLORHEXIDINE GLUCONATE 4 % EX LIQD
60.0000 mL | Freq: Once | CUTANEOUS | Status: AC
  Administered 2018-05-29: 4 via TOPICAL

## 2018-05-29 MED ORDER — ONDANSETRON HCL 4 MG/2ML IJ SOLN
INTRAMUSCULAR | Status: AC
  Filled 2018-05-29: qty 2

## 2018-05-29 MED ORDER — MORPHINE SULFATE (PF) 2 MG/ML IV SOLN
0.5000 mg | INTRAVENOUS | Status: DC | PRN
  Administered 2018-05-29: 1 mg via INTRAVENOUS
  Filled 2018-05-29: qty 1

## 2018-05-29 MED ORDER — LORATADINE 10 MG PO TABS
10.0000 mg | ORAL_TABLET | Freq: Every day | ORAL | Status: DC
  Administered 2018-05-30 – 2018-05-31 (×2): 10 mg via ORAL
  Filled 2018-05-29 (×3): qty 1

## 2018-05-29 MED ORDER — METOCLOPRAMIDE HCL 10 MG PO TABS: 5.0000 mg | ORAL_TABLET | Freq: Three times a day (TID) | ORAL | Status: DC | PRN

## 2018-05-29 MED ORDER — SUGAMMADEX SODIUM 200 MG/2ML IV SOLN
INTRAVENOUS | Status: DC | PRN
  Administered 2018-05-29: 200 mg via INTRAVENOUS

## 2018-05-29 MED ORDER — BUPIVACAINE-EPINEPHRINE (PF) 0.25% -1:200000 IJ SOLN
INTRAMUSCULAR | Status: AC
  Filled 2018-05-29: qty 30

## 2018-05-29 MED ORDER — TRAMADOL HCL 50 MG PO TABS
50.0000 mg | ORAL_TABLET | Freq: Four times a day (QID) | ORAL | Status: DC
  Administered 2018-05-29 – 2018-05-31 (×8): 50 mg via ORAL
  Filled 2018-05-29 (×8): qty 1

## 2018-05-29 MED ORDER — LIDOCAINE HCL (CARDIAC) PF 100 MG/5ML IV SOSY
PREFILLED_SYRINGE | INTRAVENOUS | Status: DC | PRN
  Administered 2018-05-29: 80 mg via INTRAVENOUS

## 2018-05-29 MED ORDER — ASPIRIN 81 MG PO CHEW
81.0000 mg | CHEWABLE_TABLET | Freq: Two times a day (BID) | ORAL | Status: DC
  Administered 2018-05-29 – 2018-05-31 (×4): 81 mg via ORAL
  Filled 2018-05-29 (×4): qty 1

## 2018-05-29 MED ORDER — TRAZODONE HCL 100 MG PO TABS
200.0000 mg | ORAL_TABLET | Freq: Every day | ORAL | Status: DC
  Administered 2018-05-29 – 2018-05-30 (×2): 200 mg via ORAL
  Filled 2018-05-29 (×2): qty 2

## 2018-05-29 MED ORDER — BUPIVACAINE HCL (PF) 0.5 % IJ SOLN
INTRAMUSCULAR | Status: DC | PRN
  Administered 2018-05-29: 3 mL

## 2018-05-29 MED ORDER — TRANEXAMIC ACID 1000 MG/10ML IV SOLN
1000.0000 mg | INTRAVENOUS | Status: AC
  Administered 2018-05-29: 1000 mg via INTRAVENOUS
  Filled 2018-05-29: qty 1100

## 2018-05-29 MED ORDER — BUPIVACAINE-EPINEPHRINE (PF) 0.25% -1:200000 IJ SOLN
INTRAMUSCULAR | Status: DC | PRN
  Administered 2018-05-29: 30 mL via PERINEURAL

## 2018-05-29 MED ORDER — POLYETHYLENE GLYCOL 3350 17 G PO PACK: 17.0000 g | PACK | Freq: Every day | ORAL | Status: DC | PRN

## 2018-05-29 MED ORDER — FENTANYL CITRATE (PF) 100 MCG/2ML IJ SOLN
INTRAMUSCULAR | Status: DC | PRN
  Administered 2018-05-29: 25 ug via INTRAVENOUS
  Administered 2018-05-29: 50 ug via INTRAVENOUS
  Administered 2018-05-29: 25 ug via INTRAVENOUS

## 2018-05-29 MED ORDER — PROPOFOL 10 MG/ML IV BOLUS
INTRAVENOUS | Status: AC
  Filled 2018-05-29: qty 40

## 2018-05-29 MED ORDER — ACETAMINOPHEN 500 MG PO TABS
500.0000 mg | ORAL_TABLET | Freq: Four times a day (QID) | ORAL | Status: AC
  Administered 2018-05-29 – 2018-05-30 (×2): 500 mg via ORAL
  Filled 2018-05-29 (×3): qty 1

## 2018-05-29 MED ORDER — PANTOPRAZOLE SODIUM 40 MG PO TBEC
40.0000 mg | DELAYED_RELEASE_TABLET | Freq: Two times a day (BID) | ORAL | Status: DC
  Administered 2018-05-29 – 2018-05-31 (×4): 40 mg via ORAL
  Filled 2018-05-29 (×4): qty 1

## 2018-05-29 MED ORDER — SODIUM CHLORIDE FLUSH 0.9 % IV SOLN
INTRAVENOUS | Status: AC
  Filled 2018-05-29: qty 20

## 2018-05-29 MED ORDER — GABAPENTIN 300 MG PO CAPS
300.0000 mg | ORAL_CAPSULE | Freq: Once | ORAL | Status: AC
  Administered 2018-05-29: 300 mg via ORAL

## 2018-05-29 MED ORDER — SODIUM CHLORIDE 0.9 % IV SOLN
INTRAVENOUS | Status: DC
  Administered 2018-05-29: 07:00:00 via INTRAVENOUS

## 2018-05-29 MED ORDER — VANCOMYCIN HCL IN DEXTROSE 1-5 GM/200ML-% IV SOLN
INTRAVENOUS | Status: AC
  Filled 2018-05-29: qty 200

## 2018-05-29 SURGICAL SUPPLY — 47 items
BLADE SAGITTAL WIDE XTHICK NO (BLADE) ×2 IMPLANT
BRUSH SCRUB EZ  4% CHG (MISCELLANEOUS) ×1
BRUSH SCRUB EZ 4% CHG (MISCELLANEOUS) ×2 IMPLANT
CHLORAPREP W/TINT 26ML (MISCELLANEOUS) ×2 IMPLANT
COVER HOLE (Hips) ×1 IMPLANT
DRAPE C-ARM 42X72 X-RAY (DRAPES) ×2 IMPLANT
DRAPE SHEET LG 3/4 BI-LAMINATE (DRAPES) ×4 IMPLANT
DRAPE STERI IOBAN 125X83 (DRAPES) ×2 IMPLANT
DRSG AQUACEL AG ADV 3.5X10 (GAUZE/BANDAGES/DRESSINGS) ×1 IMPLANT
DRSG AQUACEL AG ADV 3.5X14 (GAUZE/BANDAGES/DRESSINGS) ×2 IMPLANT
ELECT BLADE 6.5 EXT (BLADE) ×2 IMPLANT
ELECT REM PT RETURN 9FT ADLT (ELECTROSURGICAL) ×2
ELECTRODE REM PT RTRN 9FT ADLT (ELECTROSURGICAL) ×1 IMPLANT
GAUZE PETRO XEROFOAM 1X8 (MISCELLANEOUS) ×2 IMPLANT
GLOVE INDICATOR 8.0 STRL GRN (GLOVE) ×2 IMPLANT
GLOVE SURG ORTHO 8.0 STRL STRW (GLOVE) ×2 IMPLANT
GOWN STRL REUS W/ TWL LRG LVL3 (GOWN DISPOSABLE) ×2 IMPLANT
GOWN STRL REUS W/ TWL XL LVL3 (GOWN DISPOSABLE) ×1 IMPLANT
GOWN STRL REUS W/TWL LRG LVL3 (GOWN DISPOSABLE) ×4
GOWN STRL REUS W/TWL XL LVL3 (GOWN DISPOSABLE) ×2
HEAD FEMORAL SZ21 -3 OXINIUM (Head) ×1 IMPLANT
HOOD PEEL AWAY FLYTE STAYCOOL (MISCELLANEOUS) ×6 IMPLANT
IV NS 1000ML (IV SOLUTION) ×2
IV NS 1000ML BAXH (IV SOLUTION) ×1 IMPLANT
KIT PATIENT CARE HANA TABLE (KITS) ×2 IMPLANT
KIT TURNOVER CYSTO (KITS) ×2 IMPLANT
LINER 3H HEMI SHELL 48MM (Liner) ×1 IMPLANT
LINER ACETABULAR 32X48 (Liner) ×1 IMPLANT
MAT BLUE FLOOR 46X72 FLO (MISCELLANEOUS) ×2 IMPLANT
NDL SAFETY ECLIPSE 18X1.5 (NEEDLE) ×2 IMPLANT
NDL SPNL 20GX3.5 QUINCKE YW (NEEDLE) ×1 IMPLANT
NEEDLE HYPO 18GX1.5 SHARP (NEEDLE) ×4
NEEDLE HYPO 22GX1.5 SAFETY (NEEDLE) ×2 IMPLANT
NEEDLE SPNL 20GX3.5 QUINCKE YW (NEEDLE) ×2 IMPLANT
PACK HIP PROSTHESIS (MISCELLANEOUS) ×2 IMPLANT
PADDING CAST BLEND 4X4 NS (MISCELLANEOUS) ×4 IMPLANT
PILLOW ABDUCTION MEDIUM (MISCELLANEOUS) ×2 IMPLANT
PULSAVAC PLUS IRRIG FAN TIP (DISPOSABLE) ×2
SCREW 6.5X25MM (Screw) ×1 IMPLANT
STAPLER SKIN PROX 35W (STAPLE) ×2 IMPLANT
STEM STD COLLAR SZ2 POLARSTEM (Stem) ×1 IMPLANT
SUT BONE WAX W31G (SUTURE) ×2 IMPLANT
SUT DVC 2 QUILL PDO  T11 36X36 (SUTURE) ×1
SUT DVC 2 QUILL PDO T11 36X36 (SUTURE) ×1 IMPLANT
SUT VIC AB 2-0 CT1 18 (SUTURE) ×2 IMPLANT
SYR 20CC LL (SYRINGE) ×2 IMPLANT
TIP FAN IRRIG PULSAVAC PLUS (DISPOSABLE) ×1 IMPLANT

## 2018-05-29 NOTE — Transfer of Care (Signed)
Immediate Anesthesia Transfer of Care Note  Patient: Shannon [name]  Procedure(s) Performed: TOTAL HIP ARTHROPLASTY ANTERIOR APPROACH (Right Hip)  Patient Location: PACU  Anesthesia Type:General  Level of Consciousness: awake and sedated  Airway & Oxygen Therapy: Patient Spontanous Breathing and Patient connected to face mask oxygen  Post-op Assessment: Report given to RN and Post -op Vital signs reviewed and stable  Post vital signs: Reviewed and stable  Last Vitals:  Vitals Value Taken Time  BP    Temp    Pulse    Resp    SpO2      Last Pain:  Vitals:   05/29/18 0611  TempSrc: Temporal  PainSc: 3          Complications: No apparent anesthesia complications

## 2018-05-29 NOTE — Anesthesia Procedure Notes (Signed)
Performed by: Lilliah Priego E, CRNA       

## 2018-05-29 NOTE — Progress Notes (Signed)
Pt arrived to room 146 from PACU. Pt is Shannon [name]; can flex both thighs. Abduction pillow is in place, aquacel dressing is CDI to right hip with ice pack. Pt is due to void as she does not have a foley. Pt and husband educated on plan of care and pain medication regimen.  [place], Jerry Caras

## 2018-05-29 NOTE — Progress Notes (Signed)
Physical Therapy Evaluation Patient Details Name: Shannon [name] MRN: [id number] DOB: 27-Mar-1956 Today's Date: 05/29/2018   History of Present Illness  Patient went underwent right THR anterior approach without reported postop complications.  PT evaluation performed on POD #0.  PMH includes depression, osteoarthritis, and diabetes mellitus.  Clinical Impression  Pt admitted with above diagnosis. Pt currently with functional limitations due to the deficits listed below (see PT Problem List).  Patient is modified independent with bed mobility.  She requires education for safe hand placement during transfers.  Once upright she is initially mildly unsteady due to increase in pain with weight shifting to right lower extremity.  However with increased time and standing stability improves and patient is able to perform pre-gait marching with without external support. Patient is able to ambulate with short shuffling steps from bed to recliner.  Education provided for proper sequencing with rolling walker.  Decreased weight shifting to right lower extremity secondary to pain.  No external stability support required by therapist. Patient does very well with therapy today.  We will continue to progress ambulation at follow-up treatment sessions. She would benefit from outpatient physical therapy at discharge in addition to rolling walker and bedside commode. Pt will benefit from PT services to address deficits in strength, balance, and mobility in order to return to full function at home.     Follow Up Recommendations Outpatient PT    Equipment Recommendations  Rolling walker with 5" wheels;3in1 (PT)    Recommendations for Other Services       Precautions / Restrictions Precautions Precautions: Anterior Hip Precaution Booklet Issued: Yes (comment) Restrictions Weight Bearing Restrictions: Yes RLE Weight Bearing: Weight bearing as tolerated      Mobility  Bed Mobility Overal bed mobility: Modified  Independent             General bed mobility comments: Head of bed elevated, use of bedrails, increase in time required.  Once upright patient is steady sitting at edge of bed and denies dizziness or nausea.  Transfers Overall transfer level: Needs assistance Equipment used: Rolling walker (2 wheeled) Transfers: Sit to/from Stand Sit to Stand: Min guard         General transfer comment: Patient requires education for safe hand placement during transfers.  Once upright she is initially mildly unsteady due to increase in pain with weight shifting to right lower extremity.  However with increased time and standing stability improves and patient is able to perform pre-gait marching with without external support.  Ambulation/Gait Ambulation/Gait assistance: Min guard Gait Distance (Feet): 5 Feet Assistive device: Rolling walker (2 wheeled)   Gait velocity: Below functional limits for household mobility   General Gait Details: Patient is able to ambulate with short shuffling steps from bed to recliner.  Education provided for proper sequencing with rolling walker.  Decreased weight shifting to right lower extremity secondary to pain.  No external stability support required by therapist.  Stairs            Wheelchair Mobility    Modified Rankin (Stroke Patients Only)       Balance Overall balance assessment: Needs assistance Sitting-balance support: No upper extremity supported Sitting balance-Leahy Scale: Good     Standing balance support: Bilateral upper extremity supported Standing balance-Leahy Scale: Fair Standing balance comment: Requires upper extremity support on rolling walker for standing balance.  Pertinent Vitals/Pain Pain Assessment: 0-10 Pain Score: 6  Pain Location: R hip/thigh Pain Descriptors / Indicators: Aching Pain Intervention(s): Premedicated before session    Home Living Family/patient expects to be  discharged to:: Private residence Living Arrangements: Spouse/significant other Available Help at Discharge: Family Type of Home: House Home Access: Ramped entrance     Home Layout: Two level;Bed/bath upstairs;Other (Comment)(bathroom on main but bedroom upstairs) Home Equipment: Wheelchair - manual      Prior Function Level of Independence: Independent         Comments: Full community ambulator without assistive device.  No falls.  Drives.  Fully independent with ADLs/IADLs.     Hand Dominance   Dominant Hand: Right    Extremity/Trunk Assessment   Upper Extremity Assessment Upper Extremity Assessment: Overall WFL for tasks assessed    Lower Extremity Assessment Lower Extremity Assessment: RLE deficits/detail RLE Deficits / Details: To finger assist for right straight leg raise, full short arc quad without assistance.  Full dorsiflexion/plantarflexion.  Patient reports minimal numbness to right thigh, but sensation has mostly returned.       Communication   Communication: No difficulties  Cognition Arousal/Alertness: Awake/alert Behavior During Therapy: WFL for tasks assessed/performed Overall Cognitive Status: Within Functional Limits for tasks assessed                                        General Comments      Exercises Total Joint Exercises Ankle Circles/Pumps: Both;15 reps Quad Sets: Both;15 reps Gluteal Sets: Both;15 reps Towel Squeeze: Both;15 reps Short Arc Quad: Right;15 reps Heel Slides: Right;15 reps Hip ABduction/ADduction: Right;15 reps Straight Leg Raises: Right;15 reps   Assessment/Plan    PT Assessment Patient needs continued PT services  PT Problem List Decreased strength;Decreased range of motion;Decreased balance;Decreased activity tolerance;Decreased mobility       PT Treatment Interventions DME instruction;Gait training;Functional mobility training;Therapeutic activities;Therapeutic exercise;Balance  training;Neuromuscular re-education;Patient/family education    PT Goals (Current goals can be found in the Care Plan section)  Acute Rehab PT Goals Patient Stated Goal: Return to prior level of function at discharge PT Goal Formulation: With patient/family Time For Goal Achievement: 06/12/18 Potential to Achieve Goals: Good    Frequency BID   Barriers to discharge        Co-evaluation               AM-PAC PT "6 Clicks" Daily Activity  Outcome Measure Difficulty turning over in bed (including adjusting bedclothes, sheets and blankets)?: A Little Difficulty moving from lying on back to sitting on the side of the bed? : A Lot Difficulty sitting down on and standing up from a chair with arms (e.g., wheelchair, bedside commode, etc,.)?: A Lot Help needed moving to and from a bed to chair (including a wheelchair)?: A Little Help needed walking in hospital room?: A Little Help needed climbing 3-5 steps with a railing? : A Lot 6 Click Score: 15    End of Session Equipment Utilized During Treatment: Gait belt Activity Tolerance: Patient tolerated treatment well Patient left: in chair;with call bell/phone within reach;with chair alarm set;with family/visitor present;with SCD's reapplied;Other (comment)(Pillow under her right lower extremity) Nurse Communication: Mobility status PT Visit Diagnosis: Unsteadiness on feet (R26.81);Muscle weakness (generalized) (M62.81);Difficulty in walking, not elsewhere classified (R26.2)    Time: 1525-1600 PT Time Calculation (min) (ACUTE ONLY): 35 min   Charges:   PT Evaluation $PT  Eval Low Complexity: 1 Low PT Treatments $Therapeutic Exercise: 8-22 mins   PT G Codes:        Rosbel Buckner D Christain Niznik PT, DPT, GCS   Honor Frison 05/29/2018, 4:08 PM

## 2018-05-29 NOTE — OR Nursing (Signed)
C/O nausea med given

## 2018-05-29 NOTE — Anesthesia Procedure Notes (Signed)
Procedure Name: Intubation Date/Time: 05/29/2018 7:49 AM Performed by: Philbert Riser, CRNA Pre-anesthesia Checklist: Patient identified, Emergency Drugs available, Suction available, Patient being monitored and Timeout performed Patient Re-evaluated:Patient Re-evaluated prior to induction Oxygen Delivery Method: Circle system utilized and Simple face mask Preoxygenation: Pre-oxygenation with 100% oxygen Induction Type: IV induction Ventilation: Mask ventilation without difficulty Laryngoscope Size: McGraph and 3 Grade View: Grade III Tube type: Oral Tube size: 7.0 mm Number of attempts: 3 Airway Equipment and Method: Stylet Placement Confirmation: ETT inserted through vocal cords under direct vision,  positive ETCO2 and breath sounds checked- equal and bilateral Secured at: 22 cm Tube secured with: Tape Dental Injury: Teeth and Oropharynx as per pre-operative assessment  Difficulty Due To: Difficulty was unanticipated

## 2018-05-29 NOTE — Anesthesia Post-op Follow-up Note (Signed)
Anesthesia QCDR form completed.        

## 2018-05-29 NOTE — Progress Notes (Signed)
MD paged Patient blood sugar 318. Awaiting call back.

## 2018-05-29 NOTE — NC FL2 (Signed)
[facility] LEVEL OF CARE SCREENING TOOL     IDENTIFICATION  Patient Name: Shannon [name] Birthdate: 06-29-1956 Sex: female Admission Date (Current Location): 05/29/2018  [place] and [place] Number:  Engineering geologist and Address:  [facility], [address], [place], [place] [address]      Provider Number: [id number]  Attending Physician Name and Address:  Lovell Sheehan, MD  Relative Name and Phone Number:       Current Level of Care: Hospital Recommended Level of Care: [place] Prior Approval Number:    Date Approved/Denied:   PASRR Number: [id number])  Discharge Plan: SNF    Current Diagnoses: Patient Active Problem List   Diagnosis Date Noted  . Status post total hip replacement, right 05/29/2018  . Preoperative evaluation to rule out surgical contraindication 05/20/2018  . Headache 05/20/2018  . Low back pain 06/18/2017  . Fatigue 04/20/2017  . 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 Colitis 03/17/2014  . Routine general medical examination at a health care facility 09/16/2012  . Hyperlipidemia   . Type 2 diabetes mellitus with neurological manifestations, controlled ([place])   . FIBROCYSTIC BREAST DISEASE 06/12/2007  . GLAUCOMA 05/30/2007  . Essential hypertension, benign 05/30/2007  . MITRAL VALVE PROLAPSE 05/30/2007  . ASTHMA 05/30/2007  . OSTEOPENIA 05/30/2007  . ALLERGIC RHINITIS 03/20/2007  . GERD 03/20/2007  . Sleep disturbance 03/20/2007    Orientation RESPIRATION BLADDER Height & Weight     Self, Time, Place, Situation  Normal Continent Weight: 149 lb (67.6 kg) Height:  5\' 5"  (165.1 cm)  BEHAVIORAL SYMPTOMS/MOOD NEUROLOGICAL BOWEL NUTRITION STATUS      Incontinent Diet(Diet: Carb Modified. )  AMBULATORY STATUS COMMUNICATION OF NEEDS Skin   Extensive Assist Verbally Surgical wounds(Incision: Right Hip. )                       Personal Care Assistance Level of Assistance  Bathing,  Feeding, Dressing Bathing Assistance: Limited assistance Feeding assistance: Independent Dressing Assistance: Limited assistance     Functional Limitations Info  Sight, Hearing, Speech Sight Info: Adequate Hearing Info: Adequate Speech Info: Adequate    SPECIAL CARE FACTORS FREQUENCY  PT (By licensed PT), OT (By licensed OT)     PT Frequency: (5) OT Frequency: (5)            Contractures      Additional Factors Info  Code Status, Allergies, Isolation Precautions Code Status Info: (Full Code. ) Allergies Info: (Lisinopril, Nitrofurantoin, Oxycodone-acetaminophen, Atorvastatin, Clarithromycin, Glipizide)     Isolation Precautions Info: (MRSA Nasal Swab. )     Current Medications (05/29/2018):  This is the current hospital active medication list Current Facility-Administered Medications  Medication Dose Route Frequency Provider Last Rate Last Dose  . acetaminophen (TYLENOL) 500 MG tablet           . [START ON 05/30/2018] acetaminophen (TYLENOL) tablet 325-650 mg  325-650 mg Oral Q6H PRN Lovell Sheehan, MD      . acetaminophen (TYLENOL) tablet 500 mg  500 mg Oral Q6H Lovell Sheehan, MD      . aspirin chewable tablet 81 mg  81 mg Oral BID Lovell Sheehan, MD      . bisacodyl (DULCOLAX) suppository 10 mg  10 mg Rectal Daily PRN Lovell Sheehan, MD      . ceFAZolin (ANCEF) IVPB 1 g/50 mL premix  1 g Intravenous Q6H Lovell Sheehan, MD   Stopped at  05/29/18 1331  . docusate sodium (COLACE) capsule 100 mg  100 mg Oral BID Lovell Sheehan, MD   100 mg at 05/29/18 1258  . gabapentin (NEURONTIN) 300 MG capsule           . gabapentin (NEURONTIN) capsule 300 mg  300 mg Oral TID Lovell Sheehan, MD   300 mg at 05/29/18 1146  . HYDROcodone-acetaminophen (NORCO) 7.5-325 MG per tablet 1-2 tablet  1-2 tablet Oral Q4H PRN Lovell Sheehan, MD      . HYDROcodone-acetaminophen (NORCO/VICODIN) 5-325 MG per tablet 1-2 tablet  1-2 tablet Oral Q4H PRN Lovell Sheehan, MD   1 tablet at 05/29/18  1306  . ketorolac (TORADOL) 15 MG/ML injection 15 mg  15 mg Intravenous Q6H Lovell Sheehan, MD   15 mg at 05/29/18 1145  . lactated ringers infusion   Intravenous Continuous Lovell Sheehan, MD 75 mL/hr at 05/29/18 1156    . latanoprost (XALATAN) 0.005 % ophthalmic solution 1 drop  1 drop Both Eyes QHS Lovell Sheehan, MD      . loratadine (CLARITIN) tablet 10 mg  10 mg Oral Daily Lovell Sheehan, MD      . magnesium citrate solution 1 Bottle  1 Bottle Oral Once PRN Lovell Sheehan, MD      . menthol-cetylpyridinium (CEPACOL) lozenge 3 mg  1 lozenge Oral PRN Lovell Sheehan, MD       Or  . phenol (CHLORASEPTIC) mouth spray 1 spray  1 spray Mouth/Throat PRN Lovell Sheehan, MD      . Derrill Memo ON 05/30/2018] metFORMIN (GLUCOPHAGE) tablet 1,000 mg  1,000 mg Oral Q breakfast Lovell Sheehan, MD      . metoCLOPramide (REGLAN) tablet 5-10 mg  5-10 mg Oral Q8H PRN Lovell Sheehan, MD       Or  . metoCLOPramide (REGLAN) injection 5-10 mg  5-10 mg Intravenous Q8H PRN Lovell Sheehan, MD      . Derrill Memo ON 05/30/2018] metoprolol succinate (TOPROL-XL) 24 hr tablet 37.5 mg  37.5 mg Oral Daily Lovell Sheehan, MD      . morphine 2 MG/ML injection 0.5-1 mg  0.5-1 mg Intravenous Q2H PRN Lovell Sheehan, MD      . ondansetron [facility]) 4 MG/2ML injection           . ondansetron (ZOFRAN) tablet 4 mg  4 mg Oral Q6H PRN Lovell Sheehan, MD       Or  . ondansetron [facility]) injection 4 mg  4 mg Intravenous Q6H PRN Lovell Sheehan, MD      . pantoprazole (PROTONIX) EC tablet 40 mg  40 mg Oral BID Lovell Sheehan, MD      . polyethylene glycol (MIRALAX / GLYCOLAX) packet 17 g  17 g Oral Daily PRN Lovell Sheehan, MD      . traMADol [facility]) tablet 50 mg  50 mg Oral Q6H Lovell Sheehan, MD   50 mg at 05/29/18 1145  . traZODone (DESYREL) tablet 200 mg  200 mg Oral QHS Lovell Sheehan, MD      . triamterene-hydrochlorothiazide [facility]) 37.5-25 MG per tablet 1 tablet  1 tablet Oral Daily Lovell Sheehan, MD      .  vancomycin [facility]) 1-5 GM/200ML-% IVPB              Discharge Medications: Please see discharge summary for a list of discharge medications.  Relevant Imaging Results:  Relevant Lab  Results:   Additional Information (SSN: [id number])  Teodoro Jeffreys, Veronia Beets, LCSW

## 2018-05-29 NOTE — Anesthesia Procedure Notes (Signed)
Spinal  Patient location during procedure: OR Start time: 05/29/2018 7:35 AM End time: 05/29/2018 7:39 AM Staffing Anesthesiologist: Alvin Critchley, MD Performed: anesthesiologist  Preanesthetic Checklist Completed: patient identified, site marked, surgical consent, pre-op evaluation, timeout performed, IV checked, risks and benefits discussed and monitors and equipment checked Spinal Block Patient position: sitting Prep: Betadine and site prepped and draped Patient monitoring: heart rate, cardiac monitor, continuous pulse ox and blood pressure Approach: midline Location: L3-4 Injection technique: single-shot Needle Needle type: Whitacre  Needle gauge: 25 G Additional Notes Time out called and patient was placed in sitting position.  Back prepped and draped in sterile fashion.  A skin wheal was made in the L3-L4 interspace with 1% Lidocaine plain.  A 25G Whit. Needle was passed with the return of clear, colorless CSF in all 4 quadrants.  Easy X 1..  No blood or paresthesias.  Patient tolreated the procedure well.

## 2018-05-29 NOTE — Anesthesia Preprocedure Evaluation (Signed)
Anesthesia Evaluation  Patient identified by MRN, date of birth, ID band Patient awake    Reviewed: Allergy & Precautions, NPO status , Patient's Chart, lab work & pertinent test results  History of Anesthesia Complications (+) AWARENESS UNDER ANESTHESIA and Family history of anesthesia reaction  Airway Mallampati: III  TM Distance: <3 FB     Dental   Pulmonary asthma ,    Pulmonary exam normal        Cardiovascular hypertension, Normal cardiovascular exam     Neuro/Psych  Headaches, PSYCHIATRIC DISORDERS Depression    GI/Hepatic Neg liver ROS, GERD  ,  Endo/Other  diabetes  Renal/GU negative Renal ROS  negative genitourinary   Musculoskeletal  (+) Arthritis , Osteoarthritis,    Abdominal Normal abdominal exam  (+)   Peds negative pediatric ROS (+)  Hematology negative hematology ROS (+)   Anesthesia Other Findings   Reproductive/Obstetrics                             Anesthesia Physical Anesthesia Plan  ASA: II  Anesthesia Plan: Spinal   Post-op Pain Management:    Induction: Intravenous  PONV Risk Score and Plan:   Airway Management Planned: Nasal Cannula  Additional Equipment:   Intra-op Plan:   Post-operative Plan: Extubation in OR  Informed Consent: I have reviewed the patients History and Physical, chart, labs and discussed the procedure including the risks, benefits and alternatives for the proposed anesthesia with the patient or authorized representative who has indicated his/her understanding and acceptance.   Dental advisory given  Plan Discussed with: CRNA and Surgeon  Anesthesia Plan Comments:         Anesthesia Quick Evaluation

## 2018-05-29 NOTE — H&P (Signed)
The patient has been re-examined, and the chart reviewed, and there have been no interval changes to the documented history and physical.  Plan a right total hip replacement today.  Anesthesia is not consulted regarding a peripheral nerve block for post-operative pain.  The risks, benefits, and alternatives have been discussed at length, and the patient is willing to proceed.     

## 2018-05-29 NOTE — Op Note (Signed)
05/29/2018  9:25 AM  PATIENT:  Shannon [name]   MRN: [id number]  PRE-OPERATIVE DIAGNOSIS:  Osteoarthritis right hip   POST-OPERATIVE DIAGNOSIS: Same  Procedure: Right Total Hip Replacement  Surgeon: Elyn Aquas. Harlow Mares, MD   Assist: Carlynn Spry, PA-C  Anesthesia: Spinal   EBL: 200 mL   Specimens: None   Drains: None   Components used: A size 2 Polarstem [employer]hew, R3 size 48 mm shell, and a 32 mm head    Description of the procedure in detail: After informed consent was obtained and the appropriate extremity marked in the pre-operative holding area, the patient was taken to the operating room and placed in the supine position on the fracture table. All pressure points were well padded and bilateral lower extremities were place in traction spars. The hip was prepped and draped in standard sterile fashion. A spinal anesthetic had been delivered by the anesthesia team. The skin and subcutaneous tissues were injected with a mixture of Marcaine with epinephrine for post-operative pain. A longitudinal incision approximately 10 cm in length was carried out from the anterior superior iliac spine to the greater trochanter. The tensor fascia was divided and blunt dissection was taken down to the level of the joint capsule. The lateral circumflex vessels were cauterized. Deep retractors were placed and a portion of the anterior capsule was excised. Using fluoroscopy the neck cut was planned and carried out with a sagittal saw. The head was passed from the field with use of a corkscrew and hip skid. Deep retractors were placed along the acetabulum and the degenerative labrum and large osteophytes were removed with a Rongeur. The cup was sequentially reamed to a size 48 mm. The wound was irrigated and using fluoroscopy the size 48 mm cup was impacted in to anatomic position. A single screw was placed followed by a threaded hole cover. The final liner was impacted in to position. Attention was  then turned to the proximal femur. The leg was placed in extension and external rotation. The canal was opened and sequentially broached to a size 2. The trial components were placed and the hip relocated. The components were found to be in good position using fluoroscopy. The hip was dislocated and the trial components removed. The final components were impacted in to position and the hip relocated. The final components were again check with fluoroscopy and found to be in good position. Hemostasis was achieved with electrocautery. The deep capsule was injected with Marcaine and epinephrine. The wound was irrigated with bacitracin laced normal saline and the tensor fascia closed with #2 Quill suture. The subcutaneous tissues were closed with 2-0 vicryl and staples for the skin. A sterile dressing was applied and an abduction pillow. Patient tolerated the procedure well and there were no apparent complication. Patient was taken to the recovery room in good condition.   Kurtis Bushman, MD

## 2018-05-30 LAB — BASIC METABOLIC PANEL
ANION GAP: 6 (ref 5–15)
BUN: 12 mg/dL (ref 6–20)
CHLORIDE: 106 mmol/L (ref 98–111)
CO2: 29 mmol/L (ref 22–32)
Calcium: 8.4 mg/dL — ABNORMAL LOW (ref 8.9–10.3)
Creatinine, Ser: 0.67 mg/dL (ref 0.44–1.00)
GFR calc Af Amer: >60 mL/min (ref >60)
GFR calc non Af Amer: >60 mL/min (ref >60)
GLUCOSE: 148 mg/dL — AB (ref 70–99)
POTASSIUM: 4.1 mmol/L (ref 3.5–5.1)
Sodium: 141 mmol/L (ref 135–145)

## 2018-05-30 LAB — GLUCOSE, CAPILLARY
GLUCOSE-CAPILLARY: 85 mg/dL (ref 70–99)
GLUCOSE-CAPILLARY: 242 mg/dL — AB (ref 70–99)
Glucose-Capillary: 151 mg/dL — ABNORMAL HIGH (ref 70–99)
Glucose-Capillary: 181 mg/dL — ABNORMAL HIGH (ref 70–99)

## 2018-05-30 LAB — CBC
HEMATOCRIT: 31.1 % — AB (ref 35.0–47.0)
HEMOGLOBIN: 10.7 g/dL — AB (ref 12.0–16.0)
MCH: 30.1 pg (ref 26.0–34.0)
MCHC: 34.3 g/dL (ref 32.0–36.0)
MCV: 87.6 fL (ref 80.0–100.0)
Platelets: 228 10*3/uL (ref 150–440)
RBC: 3.55 MIL/uL — ABNORMAL LOW (ref 3.80–5.20)
RDW: 13.7 % (ref 11.5–14.5)
WBC: 9.6 10*3/uL (ref 3.6–11.0)

## 2018-05-30 MED ORDER — VALACYCLOVIR HCL 500 MG PO TABS
2000.0000 mg | ORAL_TABLET | Freq: Two times a day (BID) | ORAL | Status: AC
  Administered 2018-05-30 – 2018-05-31 (×2): 2000 mg via ORAL
  Filled 2018-05-30 (×2): qty 4

## 2018-05-30 MED ORDER — INSULIN ASPART 100 UNIT/ML ~~LOC~~ SOLN
0.0000 [IU] | Freq: Three times a day (TID) | SUBCUTANEOUS | Status: DC
  Administered 2018-05-30 – 2018-05-31 (×2): 3 [IU] via SUBCUTANEOUS
  Administered 2018-05-31: 5 [IU] via SUBCUTANEOUS
  Filled 2018-05-30 (×3): qty 1

## 2018-05-30 MED ORDER — INSULIN ASPART 100 UNIT/ML ~~LOC~~ SOLN
0.0000 [IU] | Freq: Every day | SUBCUTANEOUS | Status: DC
  Administered 2018-05-30: 2 [IU] via SUBCUTANEOUS
  Filled 2018-05-30: qty 1

## 2018-05-30 MED ORDER — INSULIN ASPART 100 UNIT/ML ~~LOC~~ SOLN
4.0000 [IU] | Freq: Three times a day (TID) | SUBCUTANEOUS | Status: DC
  Administered 2018-05-30 – 2018-05-31 (×2): 4 [IU] via SUBCUTANEOUS
  Filled 2018-05-30 (×3): qty 1

## 2018-05-30 NOTE — Anesthesia Postprocedure Evaluation (Signed)
Anesthesia Post Note  Patient: Shannon [name]  Procedure(s) Performed: TOTAL HIP ARTHROPLASTY ANTERIOR APPROACH (Right Hip)  Patient location during evaluation: PACU Anesthesia Type: General Level of consciousness: awake and alert and oriented Pain management: pain level controlled Vital Signs Assessment: post-procedure vital signs reviewed and stable Respiratory status: spontaneous breathing Cardiovascular status: blood pressure returned to baseline Anesthetic complications: no     Last Vitals:  Vitals:   05/30/18 0813 05/30/18 1146  BP: 126/72 119/64  Pulse: 73 79  Resp:    Temp: 36.8 C 36.6 C  SpO2: 100% 100%    Last Pain:  Vitals:   05/30/18 1303  TempSrc:   PainSc: 8                  Ximena Todaro

## 2018-05-30 NOTE — Progress Notes (Signed)
Clinical Social Worker (CSW) received SNF consult. PT is recommending outpatient PT. RN case manager aware of above. Please reconsult if future social work needs arise. CSW signing off.   Slyvia Lartigue, LCSW [phone]  

## 2018-05-30 NOTE — Progress Notes (Signed)
Subjective:  Patient reports pain as moderate.    Objective:   VITALS:   Vitals:   05/29/18 2019 05/29/18 2321 05/30/18 0407 05/30/18 0813  BP: 111/67 119/61 (!) 109/57 126/72  Pulse: 97 84 84 73  Resp: 16 18 17    Temp: 97.7 F (36.5 C) 97.8 F (36.6 C) 97.9 F (36.6 C) 98.2 F (36.8 C)  TempSrc: Oral Oral Oral Oral  SpO2: 94% 98% 96% 100%  Weight:      Height:        PHYSICAL EXAM:  Sensation intact distally Intact pulses distally Dorsiflexion/Plantar flexion intact Incision: dressing C/D/I Compartment soft  LABS  Results for orders placed or performed during the hospital encounter of 05/29/18 (from the past 24 hour(s))  Glucose, capillary     Status: Abnormal   Collection Time: 05/29/18  9:59 AM  Result Value Ref Range   Glucose-Capillary 158 (H) 70 - 99 mg/dL  Glucose, capillary     Status: Abnormal   Collection Time: 05/29/18 10:01 PM  Result Value Ref Range   Glucose-Capillary 318 (H) 70 - 99 mg/dL   Comment 1 Notify RN   CBC     Status: Abnormal   Collection Time: 05/30/18  5:40 AM  Result Value Ref Range   WBC 9.6 3.6 - 11.0 K/uL   RBC 3.55 (L) 3.80 - 5.20 MIL/uL   Hemoglobin 10.7 (L) 12.0 - 16.0 g/dL   HCT 31.1 (L) 35.0 - 47.0 %   MCV 87.6 80.0 - 100.0 fL   MCH 30.1 26.0 - 34.0 pg   MCHC 34.3 32.0 - 36.0 g/dL   RDW 13.7 11.5 - 14.5 %   Platelets 228 150 - 440 K/uL  Basic metabolic panel     Status: Abnormal   Collection Time: 05/30/18  5:40 AM  Result Value Ref Range   Sodium 141 135 - 145 mmol/L   Potassium 4.1 3.5 - 5.1 mmol/L   Chloride 106 98 - 111 mmol/L   CO2 29 22 - 32 mmol/L   Glucose, Bld 148 (H) 70 - 99 mg/dL   BUN 12 6 - 20 mg/dL   Creatinine, Ser 0.67 0.44 - 1.00 mg/dL   Calcium 8.4 (L) 8.9 - 10.3 mg/dL   GFR calc non Af Amer >60 >60 mL/min   GFR calc Af Amer >60 >60 mL/min   Anion gap 6 5 - 15    Dg Pelvis Portable  Result Date: 05/29/2018 CLINICAL DATA:  Status post right hip replacement today. EXAM: PORTABLE PELVIS 1-2  VIEWS COMPARISON:  Plain films right hip 03/06/2018. FINDINGS: New right total hip arthroplasty is in place. The device is located. No fracture. Surgical staples and gas in the soft tissues noted. The left hip is unremarkable. IMPRESSION: Status post right total hip arthroplasty.  No acute abnormality. Electronically Signed   By: Inge Rise M.D.   On: 05/29/2018 10:04   Dg Hip Operative Unilat W Or W/o Pelvis Right  Result Date: 05/29/2018 CLINICAL DATA:  Right hip replacement. EXAM: OPERATIVE RIGHT HIP (WITH PELVIS IF PERFORMED)  VIEWS TECHNIQUE: Fluoroscopic spot image(s) were submitted for interpretation post-operatively. COMPARISON:  Preoperative radiographs 03/06/2018. FINDINGS: Three spot fluoroscopic images are submitted. Fluoroscopy time 25 seconds. Right total hip arthroplasty has been performed with a screw fixed acetabular component. The hardware appears well positioned. No evidence of acute fracture or dislocation. IMPRESSION: No demonstrated complication following right total hip arthroplasty. Electronically Signed   By: Richardean Sale M.D.   On: 05/29/2018 09:21  Assessment/Plan: 1 Day Post-Op   Active Problems:   Status post total hip replacement, right   Advance diet Up with therapy D/C IV fluids Plan for discharge tomorrow, but may leave later today if passes PT Glycemic control ordered   Lovell Sheehan , MD 05/30/2018, 8:42 AM

## 2018-05-30 NOTE — Progress Notes (Signed)
[facility] NOTE   Pharmacy Consult for Valtrex Indication: herpes labialis  Allergies  Allergen Reactions  . Lisinopril Cough  . Nitrofurantoin Other (See Comments)    Pt unsure  . Oxycodone-Acetaminophen Itching  . Atorvastatin Diarrhea    May have caused colitis--but not clear cut  . Clarithromycin Palpitations  . Glipizide Other (See Comments)    Sugars too variable    Patient Measurements: Height: 5\' 5"  (165.1 cm) Weight: 149 lb (67.6 kg) IBW/kg (Calculated) : 57  Vital Signs: Temp: 98.4 F (36.9 C) (07/11 1520) Temp Source: Oral (07/11 1520) BP: 140/71 (07/11 1520) Pulse Rate: 95 (07/11 1520) Intake/Output from previous day: 07/10 0701 - 07/11 0700 In: 2660 [P.O.:480; I.V.:2130; IV Piggyback:50] Out: 1000 [Urine:800; Blood:200] Intake/Output from this shift: Total I/O In: 783.8 [P.O.:240; I.V.:543.8] Out: -   Labs: Recent Labs    05/30/18 0540  WBC 9.6  HGB 10.7*  HCT 31.1*  PLT 228  CREATININE 0.67   Estimated Creatinine Clearance: 67.3 mL/min (by C-G formula based on SCr of 0.67 mg/dL).   Microbiology: Recent Results (from the past 720 hour(s))  Surgical pcr screen     Status: Abnormal   Collection Time: 05/15/18  3:10 PM  Result Value Ref Range Status   MRSA, PCR POSITIVE (A) NEGATIVE Corrected    Comment: RESULT CALLED TO, READ BACK BY AND VERIFIED WITH: SHERRY FIELDS AT 0741 ON 05/16/2018 JJB CORRECTED ON 06/27 AT 0743: PREVIOUSLY REPORTED AS POSITIVE RESULT CALLED TO, READ BACK BY AND VERIFIED WITH: CANDICE MCFAIL AT 0732 ON 05/16/2018 JJB    Staphylococcus aureus POSITIVE (A) NEGATIVE Final    Comment: (NOTE) The Xpert SA Assay (FDA approved for NASAL specimens in patients 56 years of age and older), is one component of a comprehensive surveillance program. It is not intended to diagnose infection nor to guide or monitor treatment. Performed at [facility], [address] [place], [place] [address]      Medications:  Scheduled:  . aspirin  81 mg Oral BID  . docusate sodium  100 mg Oral BID  . gabapentin  300 mg Oral TID  . insulin aspart  0-15 Units Subcutaneous TID WC  . insulin aspart  0-5 Units Subcutaneous QHS  . insulin aspart  4 Units Subcutaneous TID WC  . latanoprost  1 drop Both Eyes QHS  . loratadine  10 mg Oral Daily  . metFORMIN  1,000 mg Oral Q breakfast  . metoprolol succinate  37.5 mg Oral Daily  . pantoprazole  40 mg Oral BID  . traMADol  50 mg Oral Q6H  . traZODone  200 mg Oral QHS  . triamterene-hydrochlorothiazide  1 tablet Oral Daily  . 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 At home she takes 2000mg  12 hours apart for two doses starting with the onset of symptoms. This dose is supported by Micromedex dosing guidelines.  Goal of Therapy:  Resolution of symptoms  Plan:  Herpes labialis  2 g ORALLY twice daily for 1 day; separate doses by 12 hours; start at the earliest onset of symptoms  Dallie Piles, PharmD 05/30/2018,3:37 PM

## 2018-05-30 NOTE — Progress Notes (Signed)
Physical Therapy Treatment Patient Details Name: Shannon [name] MRN: [id number] DOB: May 11, 1956 Today's Date: 05/30/2018    History of Present Illness Patient went underwent right THR anterior approach without reported postop complications.  PT evaluation performed on POD #0.  PMH includes depression, osteoarthritis, and diabetes mellitus.    PT Comments    Patient demonstrated progress with STS assist and ambulatory distance today. She reports lower pain level (4/10) and is able to complete sets of there ex seated in chair without pain increase.  PT provided VC's and some manual assist to educate pt concerning proper execution of there ex and pt demonstrated understanding.  Pt able to stand from chair, still needing VC's for hand placement and management of RW but able to wt shift appropriately, demonstrating mild apprehension.  She completed 60 ft of ambulation, CGA, with RW and demonstrated improved gait at PT provided VC's for knee increased knee flexion of R LE.  Pt reported that she is still experiencing some sensation deficits surrounding incision site and that she is having difficulty with SLR today.  PT educated pt concerning recovery process and  will address SLR in afternoon session.  Pt will continue to benefit from skilled PT with focus on strength, gait, proper use of RW, functional mobility and pain management.  Follow Up Recommendations  Outpatient PT     Equipment Recommendations  Rolling walker with 5" wheels;3in1 (PT)    Recommendations for Other Services       Precautions / Restrictions Precautions Precautions: Anterior Hip Precaution Booklet Issued: Yes (comment) Restrictions Weight Bearing Restrictions: Yes RLE Weight Bearing: Weight bearing as tolerated    Mobility  Bed Mobility Overal bed mobility: (Pt in chair.)                Transfers Overall transfer level: Needs assistance Equipment used: Rolling walker (2 wheeled) Transfers: Sit to/from  Stand Sit to Stand: Supervision         General transfer comment: Pt able to stand with VC's for hand placement and wt shift to manage wt bearing on R LE.  Ambulation/Gait Ambulation/Gait assistance: Min guard Gait Distance (Feet): 60 Feet Assistive device: Rolling walker (2 wheeled)     Gait velocity interpretation: <1.8 ft/sec, indicate of risk for recurrent falls General Gait Details: Step to gait with good management of RW. PT provided VC's for focus on flexion of R knee during swing phase to ensure floor clearance.   Stairs             Wheelchair Mobility    Modified Rankin (Stroke Patients Only)       Balance Overall balance assessment: Needs assistance Sitting-balance support: No upper extremity supported Sitting balance-Leahy Scale: Good     Standing balance support: Bilateral upper extremity supported Standing balance-Leahy Scale: Fair Standing balance comment: Pt relies less on RW today.  PT educated concerning relationship between BP and gripping RW too tightly.                            Cognition Arousal/Alertness: Awake/alert Behavior During Therapy: WFL for tasks assessed/performed Overall Cognitive Status: Within Functional Limits for tasks assessed                                 General Comments: Follows commands consistently      Exercises Total Joint Exercises Towel Squeeze: Both;15 reps;Seated Hip ABduction/ADduction: Right;Seated;10  reps(pillow squeeze and manual resisted hip abduction x10 each.) Long Arc Quad: Strengthening;Right;10 reps;Seated    General Comments        Pertinent Vitals/Pain Pain Assessment: 0-10 Pain Score: 4  Pain Location: R hip Pain Intervention(s): Premedicated before session    Home Living                      Prior Function            PT Goals (current goals can now be found in the care plan section) Acute Rehab PT Goals Patient Stated Goal: Return to prior  level of function at discharge PT Goal Formulation: With patient/family Time For Goal Achievement: 06/12/18 Potential to Achieve Goals: Good Progress towards PT goals: Progressing toward goals    Frequency    BID      PT Plan Current plan remains appropriate    Co-evaluation              AM-PAC PT "6 Clicks" Daily Activity  Outcome Measure  Difficulty turning over in bed (including adjusting bedclothes, sheets and blankets)?: A Little Difficulty moving from lying on back to sitting on the side of the bed? : A Lot Difficulty sitting down on and standing up from a chair with arms (e.g., wheelchair, bedside commode, etc,.)?: A Little Help needed moving to and from a bed to chair (including a wheelchair)?: A Little Help needed walking in hospital room?: A Little Help needed climbing 3-5 steps with a railing? : A Little 6 Click Score: 17    End of Session Equipment Utilized During Treatment: Gait belt Activity Tolerance: Patient tolerated treatment well Patient left: in chair;with call bell/phone within reach;with chair alarm set;with family/visitor present;Other (comment)(towel roll under her right lower extremity) Nurse Communication: Mobility status PT Visit Diagnosis: Unsteadiness on feet (R26.81);Muscle weakness (generalized) (M62.81);Difficulty in walking, not elsewhere classified (R26.2)     Time: 9528-4132 PT Time Calculation (min) (ACUTE ONLY): 32 min  Charges:  $Therapeutic Exercise: 23-37 mins                    G Codes:  Functional Assessment Tool Used: AM-PAC 6 Clicks Basic Mobility    Roxanne Gates, PT, DPT    Roxanne Gates 05/30/2018, 10:01 AM

## 2018-05-30 NOTE — Progress Notes (Signed)
Physical Therapy Treatment Patient Details Name: Shannon [name] MRN: [id number] DOB: 1956/02/14 Today's Date: 05/30/2018    History of Present Illness Patient went underwent right THR anterior approach without reported postop complications.  PT evaluation performed on POD #0.  PMH includes depression, osteoarthritis, and diabetes mellitus.    PT Comments    Pt demonstrated progress in ambulatory distance, stair negotiation and transfer ability this afternoon.  She reported increased pain level in R hip at 7/10 during there ex but was able to complete sets of 10 with some VC's and manual cues for proper form.  Pt was able to perform STS and stand without use of RW to sanitize hands prior to leaving room.  She ambulated 80 ft with RW and completed safe negotiation of 6 steps with close CGA.  Pt presented with a slow antalgic gait but proper use of RW.  PT reviewed safe handling of RW points and pt was agreeable.  PT also provided education concerning management of HEP and timeline for return of function and pt expressed understanding.  Pt reported 7/10 pain at end of treatment and PT assisted pt in repositioning in bed with pillows to alleviate pain.  Pt will continue to benefit from skilled PT with focus on strength, pain management, functional mobility and safe management of RW.  Follow Up Recommendations  Outpatient PT     Equipment Recommendations  Rolling walker with 5" wheels;3in1 (PT)    Recommendations for Other Services       Precautions / Restrictions Precautions Precautions: Anterior Hip Precaution Booklet Issued: Yes (comment) Restrictions Weight Bearing Restrictions: Yes RLE Weight Bearing: Weight bearing as tolerated    Mobility  Bed Mobility Overal bed mobility: (Pt in chair.)                Transfers Overall transfer level: Needs assistance Equipment used: Rolling walker (2 wheeled) Transfers: Sit to/from Stand Sit to Stand: Supervision         General  transfer comment: Pt able to stand with VC's for hand placement and wt shift to manage wt bearing on R LE.  Ambulation/Gait Ambulation/Gait assistance: Min guard Gait Distance (Feet): 80 Feet Assistive device: Rolling walker (2 wheeled)     Gait velocity interpretation: <1.8 ft/sec, indicate of risk for recurrent falls General Gait Details: Step through gait with better foot clearance.  Gait is still very slow and slighlty antalgic.   Stairs Stairs: Yes Stairs assistance: Min guard Stair Management: One rail Right;Step to pattern Number of Stairs: 6 General stair comments: Ascended/descended 6 steps with close CGA and VC's for leading with R LE to ascend and L LE to descend.  Pt reported pain increase when descending steps but was able to complete safely.   Wheelchair Mobility    Modified Rankin (Stroke Patients Only)       Balance Overall balance assessment: Needs assistance Sitting-balance support: No upper extremity supported Sitting balance-Leahy Scale: Good     Standing balance support: Bilateral upper extremity supported Standing balance-Leahy Scale: Good Standing balance comment: Pt was able to remove hands from RW to sanitize for 30 sec.                            Cognition Arousal/Alertness: Awake/alert Behavior During Therapy: WFL for tasks assessed/performed Overall Cognitive Status: Within Functional Limits for tasks assessed  General Comments: Follows commands consistently      Exercises Total Joint Exercises Towel Squeeze: Strengthening;Both;10 reps;Seated Short Arc Quad: Strengthening;Right;10 reps;Seated Straight Leg Raises: AAROM;5 reps;Right;Supine(Education concerning eccentric contraction for improvement of strength with advanced recovery process.) Other Exercises Other Exercises: Discussed management of HEP and what to expect during OP therapy x4 min.    General Comments         Pertinent Vitals/Pain Pain Score: 7  Pain Location: R hip Pain Descriptors / Indicators: Cramping;Aching Pain Intervention(s): Monitored during session;Limited activity within patient's tolerance    Home Living                      Prior Function            PT Goals (current goals can now be found in the care plan section) Acute Rehab PT Goals Patient Stated Goal: Return to prior level of function  PT Goal Formulation: With patient/family Time For Goal Achievement: 06/12/18 Potential to Achieve Goals: Good Progress towards PT goals: Progressing toward goals    Frequency    BID      PT Plan Current plan remains appropriate    Co-evaluation              AM-PAC PT "6 Clicks" Daily Activity  Outcome Measure  Difficulty turning over in bed (including adjusting bedclothes, sheets and blankets)?: A Little Difficulty moving from lying on back to sitting on the side of the bed? : A Little Difficulty sitting down on and standing up from a chair with arms (e.g., wheelchair, bedside commode, etc,.)?: A Little Help needed moving to and from a bed to chair (including a wheelchair)?: A Little Help needed walking in hospital room?: A Little Help needed climbing 3-5 steps with a railing? : A Little 6 Click Score: 18    End of Session Equipment Utilized During Treatment: Gait belt Activity Tolerance: Patient tolerated treatment well Patient left: with family/visitor present;Other (comment);with bed alarm set;in bed;with call bell/phone within reach(towel roll under her right lower extremity)   PT Visit Diagnosis: Unsteadiness on feet (R26.81);Muscle weakness (generalized) (M62.81);Difficulty in walking, not elsewhere classified (R26.2)     Time: 4765-4650 PT Time Calculation (min) (ACUTE ONLY): 27 min  Charges:  $Therapeutic Exercise: 8-22 mins $Therapeutic Activity: 8-22 mins                    G Codes:  Functional Assessment Tool Used: AM-PAC 6 Clicks Basic  Mobility    Roxanne Gates, PT, DPT    Roxanne Gates 05/30/2018, 4:01 PM

## 2018-05-31 ENCOUNTER — Encounter: Payer: Self-pay | Admitting: Orthopedic Surgery

## 2018-05-31 LAB — GLUCOSE, CAPILLARY
Glucose-Capillary: 181 mg/dL — ABNORMAL HIGH (ref 70–99)
Glucose-Capillary: 208 mg/dL — ABNORMAL HIGH (ref 70–99)
Glucose-Capillary: 120 mg/dL — ABNORMAL HIGH (ref 70–99)

## 2018-05-31 LAB — CBC
HCT: 29.3 % — ABNORMAL LOW (ref 35.0–47.0)
Hemoglobin: 10.1 g/dL — ABNORMAL LOW (ref 12.0–16.0)
MCH: 29.8 pg (ref 26.0–34.0)
MCHC: 34.4 g/dL (ref 32.0–36.0)
MCV: 86.6 fL (ref 80.0–100.0)
Platelets: 206 10*3/uL (ref 150–440)
RBC: 3.38 MIL/uL — AB (ref 3.80–5.20)
RDW: 13.6 % (ref 11.5–14.5)
WBC: 10.1 10*3/uL (ref 3.6–11.0)

## 2018-05-31 LAB — SURGICAL PATHOLOGY

## 2018-05-31 MED ORDER — ASPIRIN 81 MG PO CHEW: 81.0000 mg | CHEWABLE_TABLET | Freq: Two times a day (BID) | ORAL | 0 refills | Status: DC

## 2018-05-31 MED ORDER — HYDROCODONE-ACETAMINOPHEN 5-325 MG PO TABS: 1.0000 | ORAL_TABLET | ORAL | 0 refills | Status: DC | PRN

## 2018-05-31 NOTE — Discharge Summary (Signed)
Physician Discharge Summary  Patient ID: Shannon [name] MRN: [id number] DOB/AGE: 1956/02/23 62 y.o.  Admit date: 05/29/2018 Discharge date: 05/31/2018  Admission Diagnoses:  OSTEOARTHRITIS OF right HIP <principal problem not specified>  Discharge Diagnoses:  OSTEOARTHRITIS OF right HIP Active Problems:   Status post total hip replacement, right   Past Medical History:  Diagnosis Date  . Allergy   . Asthma   . Depression   . Diabetes mellitus 11/12  . Family history of adverse reaction to anesthesia    PROBLEMS WITH BLOCKS  . GERD (gastroesophageal reflux disease)   . Glaucoma suspect   . Headache   . Hyperlipidemia   . Hypertension   . Mitral valve prolapse   . Osteopenia   . Shingles 11/2006  . Sleep disturbance   . Tachycardia     Surgeries: Procedure(s): TOTAL HIP ARTHROPLASTY ANTERIOR APPROACH on 05/29/2018   Consultants (if any):   Discharged Condition: Improved  Hospital Course: Shannon [name] is an 62 y.o. female who was admitted 05/29/2018 with a diagnosis of  OSTEOARTHRITIS OF right HIP <principal problem not specified> and went to the operating room on 05/29/2018 and underwent the above named procedures.    She was given perioperative antibiotics:  Anti-infectives (From admission, onward)   Start     Dose/Rate Route Frequency Ordered Stop   05/30/18 1800  valACYclovir (VALTREX) tablet 2,000 mg     2,000 mg Oral Every 12 hours 05/30/18 1537 05/31/18 0548   05/29/18 1400  ceFAZolin (ANCEF) IVPB 1 g/50 mL premix     1 g 100 mL/hr over 30 Minutes Intravenous Every 6 hours 05/29/18 1115 05/29/18 2328   05/29/18 0704  ceFAZolin (ANCEF) 2-4 GM/100ML-% IVPB    Note to Pharmacy:  Josephina Shih   : cabinet override      05/29/18 0704 05/29/18 0751   05/29/18 0616  vancomycin (VANCOCIN) 1-5 GM/200ML-% IVPB    Note to Pharmacy:  Josephina Shih   : cabinet override      05/29/18 0616 05/29/18 1829   05/29/18 0600  ceFAZolin (ANCEF) IVPB 2g/100 mL premix     2  g 200 mL/hr over 30 Minutes Intravenous On call to O.R. 05/29/18 0104 05/29/18 0751   05/29/18 0115  vancomycin (VANCOCIN) IVPB 1000 mg/200 mL premix     1,000 mg 200 mL/hr over 60 Minutes Intravenous  Once 05/29/18 0104 05/29/18 0740    .  She was given sequential compression devices, early ambulation, and ECASA for DVT prophylaxis.  She benefited maximally from the hospital stay and there were no complications.    Recent vital signs:  Vitals:   05/31/18 0814 05/31/18 1550  BP: (!) 106/49 133/73  Pulse: (!) 118 (!) 104  Resp: 18 18  Temp: (!) 100.5 F (38.1 C) 98.4 F (36.9 C)  SpO2: 97% 99%    Recent laboratory studies:  Lab Results  Component Value Date   HGB 10.1 (L) 05/31/2018   HGB 10.7 (L) 05/30/2018   HGB 13.3 05/15/2018   Lab Results  Component Value Date   WBC 10.1 05/31/2018   PLT 206 05/31/2018   Lab Results  Component Value Date   INR 0.89 05/15/2018   Lab Results  Component Value Date   NA 141 05/30/2018   K 4.1 05/30/2018   CL 106 05/30/2018   CO2 29 05/30/2018   BUN 12 05/30/2018   CREATININE 0.67 05/30/2018   GLUCOSE 148 (H) 05/30/2018    Discharge Medications:   Allergies as  of 05/31/2018      Reactions   Lisinopril Cough   Nitrofurantoin Other (See Comments)   Pt unsure   Oxycodone-acetaminophen Itching   Atorvastatin Diarrhea   May have caused colitis--but not clear cut   Clarithromycin Palpitations   Glipizide Other (See Comments)   Sugars too variable      Medication List    STOP taking these medications   aspirin EC 81 MG tablet Replaced by:  aspirin 81 MG chewable tablet     TAKE these medications   aspirin 81 MG chewable tablet Chew 1 tablet (81 mg total) by mouth 2 (two) times daily. Replaces:  aspirin EC 81 MG tablet   aspirin-acetaminophen-caffeine 250-250-65 MG tablet Commonly known as:  EXCEDRIN MIGRAINE Take 1 tablet by mouth every 6 (six) hours as needed for headache.   Butalbital-APAP-Caffeine 50-300-40  MG Caps Take 1-2 capsules by mouth daily as needed.   cetirizine 10 MG tablet Commonly known as:  ZYRTEC Take 10 mg by mouth daily.   docusate sodium 100 MG capsule Commonly known as:  COLACE Take 300 mg by mouth at bedtime.   glucose blood test strip Commonly known as:  ONE TOUCH ULTRA TEST USE ONE STRIP TO CHECK GLUCOSE ONCE DAILY AND AS NEEDED   HYDROcodone-acetaminophen 5-325 MG tablet Commonly known as:  NORCO/VICODIN Take 1 tablet by mouth every 4 (four) hours as needed for moderate pain (pain score 4-6). What changed:    how much to take  when to take this  reasons to take this  additional instructions   LUMIGAN 0.01 % Soln Generic drug:  bimatoprost Place 1 drop into both eyes at bedtime.   metFORMIN 1000 MG tablet Commonly known as:  GLUCOPHAGE Take 1 tablet (1,000 mg total) by mouth daily with breakfast.   metoprolol succinate 25 MG 24 hr tablet Commonly known as:  TOPROL-XL Take 1.5 tablets (37.5 mg total) by mouth daily.   ondansetron 4 MG tablet Commonly known as:  ZOFRAN TAKE 1 TABLET BY MOUTH THREE TIMES DAILY AS NEEDED FOR NAUSEA OR VOMITING   ONETOUCH DELICA LANCETS FINE Misc Check blood sugar once daily and as needed. 250.00   pantoprazole 40 MG tablet Commonly known as:  PROTONIX Take 1 tablet (40 mg total) by mouth 2 (two) times daily. On an empty stomach   traZODone 100 MG tablet Commonly known as:  DESYREL Take 2 tablets (200 mg total) by mouth at bedtime.   triamterene-hydrochlorothiazide 37.5-25 MG tablet Commonly known as:  MAXZIDE-25 TAKE 1 TABLET BY MOUTH ONCE A DAY   valACYclovir 1000 MG tablet Commonly known as:  VALTREX TAKE 2 TABLETS BY MOUTH  DAILY AS NEEDED (AT THE  ONSET OF SYMPTOMS AND TAKE  2 TABS 12 HOURS LATER) What changed:  See the new instructions.       Diagnostic Studies: Dg Pelvis Portable  Result Date: 05/29/2018 CLINICAL DATA:  Status post right hip replacement today. EXAM: PORTABLE PELVIS 1-2 VIEWS  COMPARISON:  Plain films right hip 03/06/2018. FINDINGS: New right total hip arthroplasty is in place. The device is located. No fracture. Surgical staples and gas in the soft tissues noted. The left hip is unremarkable. IMPRESSION: Status post right total hip arthroplasty.  No acute abnormality. Electronically Signed   By: Inge Rise M.D.   On: 05/29/2018 10:04   Dg Hip Operative Unilat W Or W/o Pelvis Right  Result Date: 05/29/2018 CLINICAL DATA:  Right hip replacement. EXAM: OPERATIVE RIGHT HIP (WITH PELVIS IF PERFORMED)  VIEWS TECHNIQUE: Fluoroscopic spot image(s) were submitted for interpretation post-operatively. COMPARISON:  Preoperative radiographs 03/06/2018. FINDINGS: Three spot fluoroscopic images are submitted. Fluoroscopy time 25 seconds. Right total hip arthroplasty has been performed with a screw fixed acetabular component. The hardware appears well positioned. No evidence of acute fracture or dislocation. IMPRESSION: No demonstrated complication following right total hip arthroplasty. Electronically Signed   By: Richardean Sale M.D.   On: 05/29/2018 09:21    Disposition: Discharge disposition: 01-Home or Self Care            Signed: Lovell Sheehan ,MD 05/31/2018, 4:45 PM

## 2018-05-31 NOTE — Progress Notes (Signed)
Subjective:  Patient reports pain as moderate.    Objective:   VITALS:   Vitals:   05/30/18 1520 05/30/18 1930 05/31/18 0031 05/31/18 0814  BP: 140/71  (!) 98/56 (!) 106/49  Pulse: 95  (!) 104 (!) 118  Resp:    18  Temp: 98.4 F (36.9 C) (!) 100.4 F (38 C) 99.8 F (37.7 C) (!) 100.5 F (38.1 C)  TempSrc: Oral Oral Oral Oral  SpO2: 99%  90% 97%  Weight:      Height:        PHYSICAL EXAM:  ABD soft Sensation intact distally Dorsiflexion/Plantar flexion intact Incision: dressing C/D/I Compartment soft  LABS  Results for orders placed or performed during the hospital encounter of 05/29/18 (from the past 24 hour(s))  Glucose, capillary     Status: Abnormal   Collection Time: 05/30/18 11:48 AM  Result Value Ref Range   Glucose-Capillary 181 (H) 70 - 99 mg/dL  Glucose, capillary     Status: None   Collection Time: 05/30/18  5:01 PM  Result Value Ref Range   Glucose-Capillary 85 70 - 99 mg/dL  Glucose, capillary     Status: Abnormal   Collection Time: 05/30/18  9:38 PM  Result Value Ref Range   Glucose-Capillary 242 (H) 70 - 99 mg/dL  CBC     Status: Abnormal   Collection Time: 05/31/18  4:13 AM  Result Value Ref Range   WBC 10.1 3.6 - 11.0 K/uL   RBC 3.38 (L) 3.80 - 5.20 MIL/uL   Hemoglobin 10.1 (L) 12.0 - 16.0 g/dL   HCT 29.3 (L) 35.0 - 47.0 %   MCV 86.6 80.0 - 100.0 fL   MCH 29.8 26.0 - 34.0 pg   MCHC 34.4 32.0 - 36.0 g/dL   RDW 13.6 11.5 - 14.5 %   Platelets 206 150 - 440 K/uL  Glucose, capillary     Status: Abnormal   Collection Time: 05/31/18  8:11 AM  Result Value Ref Range   Glucose-Capillary 181 (H) 70 - 99 mg/dL   Comment 1 Notify RN     Dg Pelvis Portable  Result Date: 05/29/2018 CLINICAL DATA:  Status post right hip replacement today. EXAM: PORTABLE PELVIS 1-2 VIEWS COMPARISON:  Plain films right hip 03/06/2018. FINDINGS: New right total hip arthroplasty is in place. The device is located. No fracture. Surgical staples and gas in the soft  tissues noted. The left hip is unremarkable. IMPRESSION: Status post right total hip arthroplasty.  No acute abnormality. Electronically Signed   By: Inge Rise M.D.   On: 05/29/2018 10:04   Dg Hip Operative Unilat W Or W/o Pelvis Right  Result Date: 05/29/2018 CLINICAL DATA:  Right hip replacement. EXAM: OPERATIVE RIGHT HIP (WITH PELVIS IF PERFORMED)  VIEWS TECHNIQUE: Fluoroscopic spot image(s) were submitted for interpretation post-operatively. COMPARISON:  Preoperative radiographs 03/06/2018. FINDINGS: Three spot fluoroscopic images are submitted. Fluoroscopy time 25 seconds. Right total hip arthroplasty has been performed with a screw fixed acetabular component. The hardware appears well positioned. No evidence of acute fracture or dislocation. IMPRESSION: No demonstrated complication following right total hip arthroplasty. Electronically Signed   By: Richardean Sale M.D.   On: 05/29/2018 09:21    Assessment/Plan: 2 Days Post-Op   Active Problems:   Status post total hip replacement, right   Up with therapy  OK for hydrocodone for pain control BM today  Possible discharge later today   Lovell Sheehan , MD 05/31/2018, 9:06 AM

## 2018-05-31 NOTE — Progress Notes (Signed)
Physical Therapy Treatment Patient Details Name: Shannon [name] MRN: [id number] DOB: Apr 19, 1956 Today's Date: 05/31/2018    History of Present Illness Patient went underwent right THR anterior approach without reported postop complications.  PT evaluation performed on POD #0.  PMH includes depression, osteoarthritis, and diabetes mellitus.    PT Comments    Pt able to progress to ambulating 140 feet with RW with CGA to SBA.  No loss of balance noted throughout therapy session.  Pt reporting being "hot" and "sweaty" towards end of ambulation and requesting to toilet prior to sitting in chair (pt able to pass gas but no bowel movement); cool wash cloth applied to pt's forehead in chair and pt drank some ice water to cool down (pt reporting feeling better after a few minutes of rest).  Pain 7/10 R hip beginning of session and 5/10 resting in chair end of session.  Pt's vitals prior to OOB mobility: BP 110/59, HR 105 bpm, and O2 95% on room air.  Pt's vitals end of session resting in chair: BP 118/60, HR 107 bpm, and O2 97% on room air.  Will continue to progress pt with strengthening and progressive functional mobility per pt tolerance.   Follow Up Recommendations  Outpatient PT     Equipment Recommendations  Rolling walker with 5" wheels;3in1 (PT)    Recommendations for Other Services       Precautions / Restrictions Precautions Precautions: Anterior Hip Precaution Booklet Issued: Yes (comment) Restrictions Weight Bearing Restrictions: Yes RLE Weight Bearing: Weight bearing as tolerated    Mobility  Bed Mobility Overal bed mobility: Needs Assistance Bed Mobility: Supine to Sit     Supine to sit: Min assist     General bed mobility comments: bed flat; assist for R LE d/t significant pain moving on own otherwise (pt's husband present and reports he can assist pt with this initially when pt goes home  Transfers Overall transfer level: Needs assistance Equipment used: Rolling  walker (2 wheeled) Transfers: Sit to/from [facility] Sit to Stand: Supervision Stand pivot transfers: Supervision       General transfer comment: increased effort to stand on own but steady and safe; no vc's required; SBA for safety d/t R hip pain  Ambulation/Gait Ambulation/Gait assistance: Min guard;Supervision Gait Distance (Feet): 140 Feet Assistive device: Rolling walker (2 wheeled)   Gait velocity: decreased   General Gait Details: partial step through gait pattern; increased effort to advance R LE; decreased stance time R LE; increased UE support noted through RW to offweight R LE (during R LE stance phase)   Stairs             Wheelchair Mobility    Modified Rankin (Stroke Patients Only)       Balance Overall balance assessment: Needs assistance Sitting-balance support: No upper extremity supported Sitting balance-Leahy Scale: Normal Sitting balance - Comments: steady sitting reaching outside BOS   Standing balance support: No upper extremity supported Standing balance-Leahy Scale: Good Standing balance comment: able to stand and reach within BOS to sanitize hands and wash hands at sink without loss of balance                            Cognition Arousal/Alertness: Awake/alert Behavior During Therapy: WFL for tasks assessed/performed Overall Cognitive Status: Within Functional Limits for tasks assessed  General Comments: Follows commands consistently      Exercises Total Joint Exercises Ankle Circles/Pumps: AROM;Strengthening;Both;10 reps;Supine Quad Sets: AROM;Strengthening;Both;10 reps;Supine Gluteal Sets: AROM;Strengthening;Both;10 reps;Supine Towel Squeeze: AROM;Strengthening;Both;10 reps;Supine Short Arc Quad: AAROM;Strengthening;Right;10 reps;Supine Heel Slides: AAROM;Strengthening;Right;10 reps;Supine Hip ABduction/ADduction: AAROM;Strengthening;Right;10  reps;Supine    General Comments General comments (skin integrity, edema, etc.): R hip dressing in place.  Pt agreeable to PT session and appearing very motivated to participate despite reports of pain.      Pertinent Vitals/Pain Pain Assessment: 0-10 Pain Score: 5 (7/10 beginning of session; 5/10 end of session) Pain Location: R hip Pain Descriptors / Indicators: Aching;Burning Pain Intervention(s): Limited activity within patient's tolerance;Monitored during session;Premedicated before session;Repositioned;Ice applied    Home Living                      Prior Function            PT Goals (current goals can now be found in the care plan section) Acute Rehab PT Goals Patient Stated Goal: Return to prior level of function  PT Goal Formulation: With patient/family Time For Goal Achievement: 06/12/18 Potential to Achieve Goals: Good Progress towards PT goals: Progressing toward goals    Frequency    BID      PT Plan Current plan remains appropriate    Co-evaluation              AM-PAC PT "6 Clicks" Daily Activity  Outcome Measure  Difficulty turning over in bed (including adjusting bedclothes, sheets and blankets)?: A Little Difficulty moving from lying on back to sitting on the side of the bed? : Unable Difficulty sitting down on and standing up from a chair with arms (e.g., wheelchair, bedside commode, etc,.)?: A Little Help needed moving to and from a bed to chair (including a wheelchair)?: A Little Help needed walking in hospital room?: A Little Help needed climbing 3-5 steps with a railing? : A Little 6 Click Score: 16    End of Session Equipment Utilized During Treatment: Gait belt Activity Tolerance: Patient limited by pain Patient left: in chair;with call bell/phone within reach;with chair alarm set;with SCD's reapplied;Other (comment)(B heels elevated via pillow; ice packs to R hip) Nurse Communication: Mobility status;Precautions;Other  (comment)(pt's pain status) PT Visit Diagnosis: Unsteadiness on feet (R26.81);Muscle weakness (generalized) (M62.81);Difficulty in walking, not elsewhere classified (R26.2);Pain Pain - Right/Left: Right Pain - part of body: Hip     Time: 6503-5465 PT Time Calculation (min) (ACUTE ONLY): 58 min  Charges:  $Gait Training: 8-22 mins $Therapeutic Exercise: 8-22 mins $Therapeutic Activity: 23-37 mins                    G CodesLeitha Bleak, PT 05/31/18, 12:05 PM [phone]

## 2018-05-31 NOTE — Progress Notes (Signed)
Discharge summary reviewed with verbal understanding. Instructed patient to bring walker back to hospital in the am. [facility] already closed. Escorted to personal vehicle via [facility]

## 2018-05-31 NOTE — Progress Notes (Signed)
Physical Therapy Treatment Patient Details Name: Shannon [name] MRN: [id number] DOB: Sep 12, 1956 Today's Date: 05/31/2018    History of Present Illness Patient went underwent right THR anterior approach without reported postop complications.  PT evaluation performed on POD #0.  PMH includes depression, osteoarthritis, and diabetes mellitus.    PT Comments    Pt requesting to perform stairs this session (pt brought to stairs sitting in recliner but then suddenly had to go to bathroom and pt brought back to room to toilet and then brought back to perform stairs).  Pt tolerated ascending 4 stairs with railing fairly well but pt reporting significant increased R hip pain descending stairs (step to pattern with use of railing); pt requesting not to perform any more stairs d/t increased R hip pain with descending stairs.  Despite increased R hip pain, pt requesting to ambulate (pt walked back to room with RW).  Pain 5/10 R hip beginning of session and 8/10 end of session (nurse notified pt requesting pain medications).    Follow Up Recommendations  Outpatient PT     Equipment Recommendations  Rolling walker with 5" wheels;3in1 (PT)    Recommendations for Other Services       Precautions / Restrictions Precautions Precautions: Anterior Hip Precaution Booklet Issued: Yes (comment) Restrictions Weight Bearing Restrictions: Yes RLE Weight Bearing: Weight bearing as tolerated    Mobility  Bed Mobility Overal bed mobility: Needs Assistance Bed Mobility: Supine to Sit;Sit to Supine     Supine to sit: Supervision Sit to supine: Supervision;HOB elevated   General bed mobility comments: bed flat; pt using L LE to assist R LE in/out of bed; increased effort and time for pt to perform on own  Transfers Overall transfer level: Needs assistance Equipment used: Rolling walker (2 wheeled) Transfers: Sit to/from [facility] Sit to Stand: Supervision Stand pivot transfers:  Supervision       General transfer comment: increased effort to stand on own (from bed x1 trial; from [facility] over toilet x1 trial; from recliner x 3 trials) but steady and safe; no vc's required; SBA for safety d/t R hip pain  Ambulation/Gait Ambulation/Gait assistance: Min guard;Supervision Gait Distance (Feet): 100 Feet Assistive device: Rolling walker (2 wheeled)   Gait velocity: decreased   General Gait Details: partial step through gait pattern; increased effort to advance R LE (pt pushing up onto L forefoot in order to clear/advance R LE initially but decreased with repetition); decreased stance time R LE; increased UE support noted through RW to offweight R LE (during R LE stance phase)   Stairs Stairs: Yes Stairs assistance: Min guard Stair Management: One rail Left;Step to pattern;Forwards Number of Stairs: 4 General stair comments: ascended/descended 4 stairs with CGA; no vc's required for sequencing; pt did well ascending stairs but reporting increased pain R hip when descending steps so pt requested to not continue practicing stairs   Wheelchair Mobility    Modified Rankin (Stroke Patients Only)       Balance Overall balance assessment: Needs assistance Sitting-balance support: No upper extremity supported Sitting balance-Leahy Scale: Normal Sitting balance - Comments: steady sitting reaching outside BOS   Standing balance support: No upper extremity supported Standing balance-Leahy Scale: Good Standing balance comment: able to stand and reach within BOS to wash hands at sink without loss of balance                            Cognition Arousal/Alertness: Awake/alert  Behavior During Therapy: WFL for tasks assessed/performed Overall Cognitive Status: Within Functional Limits for tasks assessed                                 General Comments: Follows commands consistently      Exercises Total Joint Exercises Ankle Circles/Pumps:  AROM;Strengthening;Both;10 reps;Supine Quad Sets: AROM;Strengthening;Both;10 reps;Supine Gluteal Sets: AROM;Strengthening;Both;10 reps;Supine Towel Squeeze: AROM;Strengthening;Both;10 reps;Supine Short Arc Quad: AAROM;Strengthening;Right;10 reps;Supine Heel Slides: AAROM;Strengthening;Right;10 reps;Supine Hip ABduction/ADduction: AAROM;Strengthening;Right;10 reps;Supine    General Comments General comments (skin integrity, edema, etc.): R hip dressing in place.  Nursing cleared pt for participation in physical therapy.  Pt agreeable to PT session.  Pt's husband present entire PT session.      Pertinent Vitals/Pain Pain Assessment: 0-10 Pain Score: 8  Pain Location: R hip Pain Descriptors / Indicators: Aching;Burning Pain Intervention(s): Limited activity within patient's tolerance;Monitored during session;Premedicated before session;Repositioned;Patient requesting pain meds-RN notified;Ice applied  BP (at 1435) 119/60.  HR and O2 sats on room air WFL.    Home Living                      Prior Function            PT Goals (current goals can now be found in the care plan section) Acute Rehab PT Goals Patient Stated Goal: Return to prior level of function  PT Goal Formulation: With patient/family Time For Goal Achievement: 06/12/18 Potential to Achieve Goals: Good Progress towards PT goals: Progressing toward goals    Frequency    BID      PT Plan Current plan remains appropriate    Co-evaluation              AM-PAC PT "6 Clicks" Daily Activity  Outcome Measure  Difficulty turning over in bed (including adjusting bedclothes, sheets and blankets)?: A Little Difficulty moving from lying on back to sitting on the side of the bed? : A Lot Difficulty sitting down on and standing up from a chair with arms (e.g., wheelchair, bedside commode, etc,.)?: A Little Help needed moving to and from a bed to chair (including a wheelchair)?: A Little Help needed walking  in hospital room?: A Little Help needed climbing 3-5 steps with a railing? : A Little 6 Click Score: 17    End of Session Equipment Utilized During Treatment: Gait belt Activity Tolerance: Patient limited by pain Patient left: in bed;with call bell/phone within reach;with bed alarm set;with family/visitor present;with SCD's reapplied;Other (comment)(B heels elevated via pillow) Nurse Communication: Mobility status;Precautions;Other (comment);Patient requests pain meds(Pt's pain status) PT Visit Diagnosis: Unsteadiness on feet (R26.81);Muscle weakness (generalized) (M62.81);Difficulty in walking, not elsewhere classified (R26.2);Pain Pain - Right/Left: Right Pain - part of body: Hip     Time: 1425-1511 PT Time Calculation (min) (ACUTE ONLY): 46 min  Charges:  $Gait Training: 8-22 mins $Therapeutic Activity: 23-37 mins                    G CodesLeitha Bleak, PT 05/31/18, 3:45 PM [phone]

## 2018-05-31 NOTE — Discharge Instructions (Signed)

## 2018-06-02 ENCOUNTER — Emergency Department: Payer: 59

## 2018-06-02 ENCOUNTER — Observation Stay
Admission: EM | Admit: 2018-06-02 | Discharge: 2018-06-03 | Disposition: A | Discharge status: Home / Self Care | Payer: 59 | Attending: Orthopedic Surgery | Admitting: Orthopedic Surgery

## 2018-06-02 ENCOUNTER — Other Ambulatory Visit: Payer: Self-pay

## 2018-06-02 ENCOUNTER — Observation Stay: Payer: 59

## 2018-06-02 DIAGNOSIS — G8918 Other acute postprocedural pain: Secondary | ICD-10-CM | POA: Diagnosis not present

## 2018-06-02 DIAGNOSIS — Z90710 Acquired absence of both cervix and uterus: Secondary | ICD-10-CM | POA: Insufficient documentation

## 2018-06-02 DIAGNOSIS — Z96641 Presence of right artificial hip joint: Secondary | ICD-10-CM | POA: Insufficient documentation

## 2018-06-02 DIAGNOSIS — I10 Essential (primary) hypertension: Secondary | ICD-10-CM | POA: Diagnosis not present

## 2018-06-02 DIAGNOSIS — K219 Gastro-esophageal reflux disease without esophagitis: Secondary | ICD-10-CM | POA: Diagnosis not present

## 2018-06-02 DIAGNOSIS — E785 Hyperlipidemia, unspecified: Secondary | ICD-10-CM | POA: Diagnosis not present

## 2018-06-02 DIAGNOSIS — R509 Fever, unspecified: Secondary | ICD-10-CM | POA: Diagnosis not present

## 2018-06-02 DIAGNOSIS — R102 Pelvic and perineal pain: Secondary | ICD-10-CM | POA: Diagnosis not present

## 2018-06-02 DIAGNOSIS — Z79891 Long term (current) use of opiate analgesic: Secondary | ICD-10-CM | POA: Diagnosis not present

## 2018-06-02 DIAGNOSIS — E119 Type 2 diabetes mellitus without complications: Secondary | ICD-10-CM | POA: Insufficient documentation

## 2018-06-02 DIAGNOSIS — F329 Major depressive disorder, single episode, unspecified: Secondary | ICD-10-CM | POA: Insufficient documentation

## 2018-06-02 DIAGNOSIS — Z885 Allergy status to narcotic agent status: Secondary | ICD-10-CM | POA: Diagnosis not present

## 2018-06-02 DIAGNOSIS — J309 Allergic rhinitis, unspecified: Secondary | ICD-10-CM | POA: Insufficient documentation

## 2018-06-02 DIAGNOSIS — M7989 Other specified soft tissue disorders: Secondary | ICD-10-CM | POA: Diagnosis not present

## 2018-06-02 DIAGNOSIS — Z79899 Other long term (current) drug therapy: Secondary | ICD-10-CM | POA: Diagnosis not present

## 2018-06-02 DIAGNOSIS — Z7982 Long term (current) use of aspirin: Secondary | ICD-10-CM | POA: Insufficient documentation

## 2018-06-02 DIAGNOSIS — Z7984 Long term (current) use of oral hypoglycemic drugs: Secondary | ICD-10-CM | POA: Insufficient documentation

## 2018-06-02 DIAGNOSIS — Z888 Allergy status to other drugs, medicaments and biological substances status: Secondary | ICD-10-CM | POA: Diagnosis not present

## 2018-06-02 DIAGNOSIS — M79604 Pain in right leg: Secondary | ICD-10-CM | POA: Diagnosis not present

## 2018-06-02 DIAGNOSIS — Z09 Encounter for follow-up examination after completed treatment for conditions other than malignant neoplasm: Secondary | ICD-10-CM | POA: Diagnosis present

## 2018-06-02 DIAGNOSIS — Z881 Allergy status to other antibiotic agents status: Secondary | ICD-10-CM | POA: Insufficient documentation

## 2018-06-02 DIAGNOSIS — M79661 Pain in right lower leg: Secondary | ICD-10-CM | POA: Diagnosis not present

## 2018-06-02 LAB — URINALYSIS, COMPLETE (UACMP) WITH MICROSCOPIC
BACTERIA UA: NONE SEEN
BILIRUBIN URINE: NEGATIVE
Glucose, UA: NEGATIVE mg/dL
KETONES UR: NEGATIVE mg/dL
NITRITE: NEGATIVE
PROTEIN: NEGATIVE mg/dL
SPECIFIC GRAVITY, URINE: 1.008 (ref 1.005–1.030)
Squamous Epithelial / LPF: NONE SEEN (ref 0–5)
pH: 8 (ref 5.0–8.0)

## 2018-06-02 LAB — COMPREHENSIVE METABOLIC PANEL
ALT: 23 U/L (ref 0–44)
ANION GAP: 11 (ref 5–15)
AST: 29 U/L (ref 15–41)
Albumin: 3.5 g/dL (ref 3.5–5.0)
Alkaline Phosphatase: 86 U/L (ref 38–126)
BUN: 10 mg/dL (ref 6–20)
CHLORIDE: 104 mmol/L (ref 98–111)
CO2: 27 mmol/L (ref 22–32)
Calcium: 9.4 mg/dL (ref 8.9–10.3)
Creatinine, Ser: 0.74 mg/dL (ref 0.44–1.00)
GFR calc Af Amer: >60 mL/min (ref >60)
GFR calc non Af Amer: >60 mL/min (ref >60)
Glucose, Bld: 120 mg/dL — ABNORMAL HIGH (ref 70–99)
Potassium: 3.9 mmol/L (ref 3.5–5.1)
SODIUM: 142 mmol/L (ref 135–145)
Total Bilirubin: 0.6 mg/dL (ref 0.3–1.2)
Total Protein: 7.2 g/dL (ref 6.5–8.1)

## 2018-06-02 LAB — CBC WITH DIFFERENTIAL/PLATELET
BASOS PCT: 0 %
Basophils Absolute: 0 10*3/uL (ref 0–0.1)
Eosinophils Absolute: 0.4 10*3/uL (ref 0–0.7)
Eosinophils Relative: 5 %
HEMATOCRIT: 30.9 % — AB (ref 35.0–47.0)
HEMOGLOBIN: 10.7 g/dL — AB (ref 12.0–16.0)
LYMPHS ABS: 2.1 10*3/uL (ref 1.0–3.6)
Lymphocytes Relative: 26 %
MCH: 30 pg (ref 26.0–34.0)
MCHC: 34.8 g/dL (ref 32.0–36.0)
MCV: 86.2 fL (ref 80.0–100.0)
MONOS PCT: 11 %
Monocytes Absolute: 0.9 10*3/uL (ref 0.2–0.9)
NEUTROS ABS: 4.8 10*3/uL (ref 1.4–6.5)
Neutrophils Relative %: 58 %
Platelets: 323 10*3/uL (ref 150–440)
RBC: 3.58 MIL/uL — AB (ref 3.80–5.20)
RDW: 13.5 % (ref 11.5–14.5)
WBC: 8.3 10*3/uL (ref 3.6–11.0)

## 2018-06-02 LAB — LACTIC ACID, PLASMA
LACTIC ACID, VENOUS: 2.1 mmol/L — AB (ref 0.5–1.9)
Lactic Acid, Venous: 2.8 mmol/L (ref 0.5–1.9)

## 2018-06-02 LAB — POCT PREGNANCY, URINE: PREG TEST UR: NEGATIVE

## 2018-06-02 LAB — SEDIMENTATION RATE: Sed Rate: 91 mm/hr — ABNORMAL HIGH (ref 0–30)

## 2018-06-02 LAB — GLUCOSE, CAPILLARY
GLUCOSE-CAPILLARY: 112 mg/dL — AB (ref 70–99)
GLUCOSE-CAPILLARY: 238 mg/dL — AB (ref 70–99)

## 2018-06-02 LAB — C-REACTIVE PROTEIN: CRP: 15.6 mg/dL — ABNORMAL HIGH (ref <1.0)

## 2018-06-02 MED ORDER — METOPROLOL SUCCINATE ER 25 MG PO TB24
37.5000 mg | ORAL_TABLET | Freq: Every day | ORAL | Status: DC
  Administered 2018-06-03: 37.5 mg via ORAL
  Filled 2018-06-02: qty 2

## 2018-06-02 MED ORDER — INSULIN ASPART 100 UNIT/ML ~~LOC~~ SOLN
4.0000 [IU] | Freq: Three times a day (TID) | SUBCUTANEOUS | Status: DC
  Administered 2018-06-03 (×2): 4 [IU] via SUBCUTANEOUS
  Filled 2018-06-02 (×2): qty 1

## 2018-06-02 MED ORDER — CHLORHEXIDINE GLUCONATE CLOTH 2 % EX PADS
6.0000 | MEDICATED_PAD | Freq: Every day | CUTANEOUS | Status: DC
  Administered 2018-06-03: 6 via TOPICAL

## 2018-06-02 MED ORDER — ONDANSETRON HCL 4 MG PO TABS: 4.0000 mg | ORAL_TABLET | Freq: Three times a day (TID) | ORAL | Status: DC | PRN

## 2018-06-02 MED ORDER — INSULIN ASPART 100 UNIT/ML ~~LOC~~ SOLN
0.0000 [IU] | Freq: Three times a day (TID) | SUBCUTANEOUS | Status: DC
  Administered 2018-06-03 (×2): 2 [IU] via SUBCUTANEOUS
  Filled 2018-06-02 (×2): qty 1

## 2018-06-02 MED ORDER — VANCOMYCIN HCL IN DEXTROSE 1-5 GM/200ML-% IV SOLN
1000.0000 mg | Freq: Two times a day (BID) | INTRAVENOUS | Status: DC
  Administered 2018-06-02: 1000 mg via INTRAVENOUS
  Filled 2018-06-02 (×3): qty 200

## 2018-06-02 MED ORDER — GABAPENTIN 300 MG PO CAPS
300.0000 mg | ORAL_CAPSULE | Freq: Three times a day (TID) | ORAL | Status: DC
  Administered 2018-06-02 – 2018-06-03 (×2): 300 mg via ORAL
  Filled 2018-06-02 (×2): qty 1

## 2018-06-02 MED ORDER — VANCOMYCIN HCL IN DEXTROSE 1-5 GM/200ML-% IV SOLN
1000.0000 mg | Freq: Once | INTRAVENOUS | Status: DC
  Filled 2018-06-02: qty 200

## 2018-06-02 MED ORDER — PIPERACILLIN-TAZOBACTAM 3.375 G IVPB 30 MIN: 3.3750 g | Freq: Once | INTRAVENOUS | Status: DC

## 2018-06-02 MED ORDER — MORPHINE SULFATE (PF) 4 MG/ML IV SOLN
4.0000 mg | Freq: Once | INTRAVENOUS | Status: AC
  Administered 2018-06-02: 4 mg via INTRAVENOUS
  Filled 2018-06-02: qty 1

## 2018-06-02 MED ORDER — TRIAMTERENE-HCTZ 37.5-25 MG PO TABS
1.0000 | ORAL_TABLET | Freq: Every day | ORAL | Status: DC
  Administered 2018-06-03: 1 via ORAL
  Filled 2018-06-02: qty 1

## 2018-06-02 MED ORDER — BISACODYL 10 MG RE SUPP: 10.0000 mg | Freq: Every day | RECTAL | Status: DC | PRN

## 2018-06-02 MED ORDER — ONDANSETRON HCL 4 MG/2ML IJ SOLN
4.0000 mg | Freq: Once | INTRAMUSCULAR | Status: AC
  Administered 2018-06-02: 4 mg via INTRAVENOUS
  Filled 2018-06-02: qty 2

## 2018-06-02 MED ORDER — POLYETHYLENE GLYCOL 3350 17 G PO PACK: 17.0000 g | PACK | Freq: Every day | ORAL | Status: DC | PRN

## 2018-06-02 MED ORDER — PANTOPRAZOLE SODIUM 40 MG PO TBEC
40.0000 mg | DELAYED_RELEASE_TABLET | Freq: Two times a day (BID) | ORAL | Status: DC
  Administered 2018-06-02 – 2018-06-03 (×2): 40 mg via ORAL
  Filled 2018-06-02 (×2): qty 1

## 2018-06-02 MED ORDER — VANCOMYCIN HCL IN DEXTROSE 1-5 GM/200ML-% IV SOLN
1000.0000 mg | Freq: Once | INTRAVENOUS | Status: AC
  Administered 2018-06-02: 1000 mg via INTRAVENOUS

## 2018-06-02 MED ORDER — LACTATED RINGERS IV SOLN
INTRAVENOUS | Status: DC
  Administered 2018-06-02: 20:00:00 via INTRAVENOUS

## 2018-06-02 MED ORDER — PIPERACILLIN-TAZOBACTAM 3.375 G IVPB
3.3750 g | Freq: Three times a day (TID) | INTRAVENOUS | Status: DC
  Administered 2018-06-02 – 2018-06-03 (×2): 3.375 g via INTRAVENOUS
  Filled 2018-06-02 (×2): qty 50

## 2018-06-02 MED ORDER — KETOROLAC TROMETHAMINE 30 MG/ML IJ SOLN
15.0000 mg | Freq: Four times a day (QID) | INTRAMUSCULAR | Status: DC
  Administered 2018-06-02 – 2018-06-03 (×3): 15 mg via INTRAVENOUS
  Filled 2018-06-02 (×3): qty 1

## 2018-06-02 MED ORDER — VALACYCLOVIR HCL 500 MG PO TABS
1000.0000 mg | ORAL_TABLET | Freq: Two times a day (BID) | ORAL | Status: DC | PRN
  Filled 2018-06-02: qty 2

## 2018-06-02 MED ORDER — INSULIN ASPART 100 UNIT/ML ~~LOC~~ SOLN
0.0000 [IU] | Freq: Every day | SUBCUTANEOUS | Status: DC
  Administered 2018-06-02: 2 [IU] via SUBCUTANEOUS
  Filled 2018-06-02: qty 1

## 2018-06-02 MED ORDER — DOCUSATE SODIUM 100 MG PO CAPS
300.0000 mg | ORAL_CAPSULE | Freq: Every day | ORAL | Status: DC
  Administered 2018-06-02: 300 mg via ORAL
  Filled 2018-06-02: qty 3

## 2018-06-02 MED ORDER — HYDROCODONE-ACETAMINOPHEN 10-325 MG PO TABS
1.0000 | ORAL_TABLET | ORAL | Status: DC | PRN
  Administered 2018-06-02: 2 via ORAL
  Administered 2018-06-03 (×2): 1 via ORAL
  Filled 2018-06-02 (×2): qty 2

## 2018-06-02 MED ORDER — DIAZEPAM 5 MG PO TABS: 5.0000 mg | ORAL_TABLET | Freq: Four times a day (QID) | ORAL | Status: DC | PRN

## 2018-06-02 MED ORDER — LORATADINE 10 MG PO TABS
10.0000 mg | ORAL_TABLET | Freq: Every day | ORAL | Status: DC
  Administered 2018-06-03: 10 mg via ORAL
  Filled 2018-06-02: qty 1

## 2018-06-02 MED ORDER — MUPIROCIN 2 % EX OINT
1.0000 "application " | TOPICAL_OINTMENT | Freq: Two times a day (BID) | CUTANEOUS | Status: DC
  Administered 2018-06-02 – 2018-06-03 (×2): 1 via NASAL
  Filled 2018-06-02: qty 22

## 2018-06-02 MED ORDER — LATANOPROST 0.005 % OP SOLN
1.0000 [drp] | Freq: Every day | OPHTHALMIC | Status: DC
  Administered 2018-06-02: 1 [drp] via OPHTHALMIC
  Filled 2018-06-02: qty 2.5

## 2018-06-02 MED ORDER — METFORMIN HCL 500 MG PO TABS
1000.0000 mg | ORAL_TABLET | Freq: Every day | ORAL | Status: DC
  Administered 2018-06-03: 1000 mg via ORAL
  Filled 2018-06-02: qty 2

## 2018-06-02 MED ORDER — PIPERACILLIN-TAZOBACTAM 3.375 G IVPB 30 MIN
3.3750 g | Freq: Once | INTRAVENOUS | Status: AC
  Administered 2018-06-02: 3.375 g via INTRAVENOUS
  Filled 2018-06-02: qty 50

## 2018-06-02 MED ORDER — GABAPENTIN 100 MG PO CAPS
100.0000 mg | ORAL_CAPSULE | Freq: Once | ORAL | Status: AC
  Administered 2018-06-02: 100 mg via ORAL
  Filled 2018-06-02: qty 1

## 2018-06-02 MED ORDER — MAGNESIUM CITRATE PO SOLN
1.0000 | Freq: Once | ORAL | Status: DC | PRN
  Filled 2018-06-02: qty 296

## 2018-06-02 MED ORDER — ASPIRIN 81 MG PO CHEW
81.0000 mg | CHEWABLE_TABLET | Freq: Two times a day (BID) | ORAL | Status: DC
  Administered 2018-06-02 – 2018-06-03 (×2): 81 mg via ORAL
  Filled 2018-06-02 (×2): qty 1

## 2018-06-02 MED ORDER — TRAZODONE HCL 100 MG PO TABS
200.0000 mg | ORAL_TABLET | Freq: Every day | ORAL | Status: DC
  Administered 2018-06-02: 200 mg via ORAL
  Filled 2018-06-02: qty 2

## 2018-06-02 MED ORDER — SODIUM CHLORIDE 0.9 % IV BOLUS
1000.0000 mL | Freq: Once | INTRAVENOUS | Status: AC
  Administered 2018-06-02: 1000 mL via INTRAVENOUS

## 2018-06-02 NOTE — H&P (Addendum)
PREOPERATIVE H&P  Chief Complaint: increasing pain after surgery, fever and chills  HPI: Shannon [name] is a 62 y.o. female who presents with fever, chills and increasing pain after right total hip on 05/29/2018. Symptoms are rated as moderate to severe, and have been worsening.  Her pain is a 8 or 9 out of 10. She denies any numbness or tingling. She has had a low grade fever. She has been ambulating without difficulty but has had muscle spasms and increasing pain.   Past Medical History:  Diagnosis Date  . Allergy   . Asthma   . Depression   . Diabetes mellitus 11/12  . Family history of adverse reaction to anesthesia    PROBLEMS WITH BLOCKS  . GERD (gastroesophageal reflux disease)   . Glaucoma suspect   . Headache   . Hyperlipidemia   . Hypertension   . Mitral valve prolapse   . Osteopenia   . Shingles 11/2006  . Sleep disturbance   . Tachycardia    Past Surgical History:  Procedure Laterality Date  . ABDOMINAL HYSTERECTOMY  2000   /BSO - abn. [facility]  . APPENDECTOMY  1978  . AUGMENTATION MAMMAPLASTY Bilateral 1981  . BLADDER REPAIR     x2  . BREAST ENHANCEMENT SURGERY  1987  . [place] Left 12/18/2016   Procedure: Left Heart Cath and Coronary Angiography;  Surgeon: Wellington Hampshire, MD;  Location: [facility];  Service: Cardiovascular;  Laterality: Left;  . CARPAL TUNNEL RELEASE Right   . CERVICAL DISCECTOMY  11/2009   with synthetic discs inserted--  Dr Arnoldo Morale  . NM MYOVIEW LTD  03/2005   EF 56%, no ischemia  . RECTAL PROLAPSE REPAIR     x1  . SHOULDER SURGERY  09/2008, 01/2009   Right shoulder surgery, then redone with lysis of adhesions  . TOTAL HIP ARTHROPLASTY Right 05/29/2018   Procedure: TOTAL HIP ARTHROPLASTY ANTERIOR APPROACH;  Surgeon: Lovell Sheehan, MD;  Location: [facility];  Service: Orthopedics;  Laterality: Right;   Social History   Socioeconomic History  . Marital status: Married     Spouse name: Not on file  . Number of children: 5  . Years of education: Not on file  . Highest education level: Not on file  Occupational History  . Occupation: homemaker  Social Needs  . Financial resource strain: Not on file  . Food insecurity:    Worry: Not on file    Inability: Not on file  . Transportation needs:    Medical: Not on file    Non-medical: Not on file  Tobacco Use  . Smoking status: Never Smoker  . Smokeless tobacco: Never Used  Substance and Sexual Activity  . Alcohol use: No  . Drug use: No  . Sexual activity: Not on file  Lifestyle  . Physical activity:    Days per week: Not on file    Minutes per session: Not on file  . Stress: Not on file  Relationships  . Social connections:    Talks on phone: Not on file    Gets together: Not on file    Attends religious service: Not on file    Active member of club or organization: Not on file    Attends meetings of clubs or organizations: Not on file    Relationship status: Not on file  Other Topics Concern  . Not on file  Social History Narrative  Home maker   Doesn't exercise      Judeen Hammans Talley's sister         Family History  Problem Relation Age of Onset  . Diabetes Mother   . Heart disease Mother   . Hypertension Mother   . Ovarian cancer Mother   . Melanoma Mother   . Cancer Father   . Coronary artery disease Father   . Heart disease Father   . Diabetes Sister   . Depression Sister   . Coronary artery disease Sister   . Heart disease Sister   . Rheum arthritis Maternal Aunt   . Breast cancer Maternal Grandmother   . Diabetes Maternal Grandmother   . Heart attack Paternal Uncle   . Heart attack Cousin   . Heart attack Paternal Uncle   . Diabetes Maternal Grandfather   . Colon cancer Neg Hx    Allergies  Allergen Reactions  . Lisinopril Cough  . Nitrofurantoin Other (See Comments)    Pt unsure  . Oxycodone-Acetaminophen Itching  . Atorvastatin Diarrhea    May have caused  colitis--but not clear cut  . Clarithromycin Palpitations  . Glipizide Other (See Comments)    Sugars too variable   Prior to Admission medications   Medication Sig Start Date End Date Taking? Authorizing Provider  aspirin 81 MG chewable tablet Chew 1 tablet (81 mg total) by mouth 2 (two) times daily. 05/31/18  Yes Lovell Sheehan, MD  aspirin-acetaminophen-caffeine (EXCEDRIN MIGRAINE) [phone] MG tablet Take 1 tablet by mouth every 6 (six) hours as needed for headache.   Yes [provider]  Butalbital-APAP-Caffeine 50-300-40 MG CAPS Take 1-2 capsules by mouth daily as needed. 05/20/18  Yes Venia Carbon, MD  cetirizine (ZYRTEC) 10 MG tablet Take 10 mg by mouth daily.   Yes [provider]  docusate sodium (COLACE) 100 MG capsule Take 300 mg by mouth at bedtime.    Yes [provider]  HYDROcodone-acetaminophen (NORCO/VICODIN) 5-325 MG tablet Take 1 tablet by mouth every 4 (four) hours as needed for moderate pain (pain score 4-6). 05/31/18  Yes Lovell Sheehan, MD  LUMIGAN 0.01 % SOLN Place 1 drop into both eyes at bedtime.  10/13/15  Yes [provider]  metFORMIN (GLUCOPHAGE) 1000 MG tablet Take 1 tablet (1,000 mg total) by mouth daily with breakfast. 02/18/18  Yes Venia Carbon, MD  metoprolol succinate (TOPROL-XL) 25 MG 24 hr tablet Take 1.5 tablets (37.5 mg total) by mouth daily. 05/03/17  Yes Viviana Simpler I, MD  ondansetron (ZOFRAN) 4 MG tablet TAKE 1 TABLET BY MOUTH THREE TIMES DAILY AS NEEDED FOR NAUSEA OR VOMITING 09/19/17  Yes Venia Carbon, MD  pantoprazole (PROTONIX) 40 MG tablet Take 1 tablet (40 mg total) by mouth 2 (two) times daily. On an empty stomach 02/13/18  Yes Venia Carbon, MD  traZODone (DESYREL) 100 MG tablet Take 2 tablets (200 mg total) by mouth at bedtime. 08/14/17  Yes Venia Carbon, MD  triamterene-hydrochlorothiazide (MAXZIDE-25) 37.5-25 MG tablet TAKE 1 TABLET BY MOUTH ONCE A DAY 12/20/17  Yes Venia Carbon, MD   valACYclovir (VALTREX) 1000 MG tablet TAKE 2 TABLETS BY MOUTH  DAILY AS NEEDED (AT THE  ONSET OF SYMPTOMS AND TAKE  2 TABS 12 HOURS LATER) Patient taking differently: TAKE 2 TABLETS BY MOUTH  DAILY AS NEEDED (AT THE  ONSET OF SYMPTOMS AND TAKE  2 TABS 12 HOURS LATER) FOR FEVER BLISTERS. 03/21/18  Yes Venia Carbon, MD  glucose blood (ONE TOUCH ULTRA TEST) test strip USE ONE STRIP TO CHECK GLUCOSE ONCE DAILY AND AS NEEDED 06/18/17   Venia Carbon, MD  [facility] DELICA LANCETS FINE MISC Check blood sugar once daily and as needed. 250.00 05/26/14   Venia Carbon, MD     Positive ROS: All other systems have been reviewed and were otherwise negative with the exception of those mentioned in the HPI and as above.  Physical Exam: General: Alert, no acute distress Cardiovascular: Regular rate and rhythm, no murmurs rubs or gallops.  No pedal edema Respiratory: Clear to auscultation bilaterally, no wheezes rales or rhonchi. No cyanosis, no use of accessory musculature GI: No organomegaly, abdomen is soft and non-tender nondistended with positive bowel sounds. Skin: Skin intact, no lesions within the operative field. Neurologic: Sensation intact distally Psychiatric: Patient is competent for consent with normal mood and affect Lymphatic: No axillary or cervical lymphadenopathy  MUSCULOSKELETAL: incision is clean, dry and intact. No erythema, warmth or drainage. +DF/PF and EHL. Calf is soft. Homan's is negative Motor and sensory are intact distally  Assessment: POD#4 s/p right total hip replacement Fever/chills  Plan: Will follow urine and blood cultures. Broad antibiotic treatment for now. Likely discharge on septra DS and doxycycline as a short course if she improves clinically.  Up with PT tomorrow  Increase pain medication to hydrocodone 10/325 Add gabapentin  Start diazepam for muscle spasms  Discussed the treatment plan with the patient and her husband and they are in agreement.  Possible discharge tomorrow if cultures remain negative and pain improves.   Lovell Sheehan, MD   06/02/2018 8:04 PM

## 2018-06-02 NOTE — ED Notes (Signed)
Date and time results received: 06/02/18 4:19 PM   Test: Lactic Acid Critical Value: 2.1  Name of Provider Notified: Dr. Alfred Levins

## 2018-06-02 NOTE — Consult Note (Signed)
Pharmacy Antibiotic Note  Shannon [name] is a 62 y.o. female admitted on 06/02/2018 with  sepsis.  Pharmacy has been consulted for Zosyn and Vancomycin dosing. Patient received Vancomycin 1g and Zosyn 3.375 IV x 1 dose in ED. Patient is 4 days postop, S/P hip replacement.   Plan: Ke: 0.067   T1/2: 10.34   Vd: 46.9  Start Vancomycin 1000 IV every 12 hours, with 6 hour stack dosing.  Goal trough 15-20 mcg/mL. Calculated trough at Css is 18.5. Trough level ordered prior to 4th dose. Will monitor renal function and adjust dose as needed.   Start Zosyn 3.375 IV EI every 8 hours.   Height: 5\' 4"  (162.6 cm) Weight: 149 lb (67.6 kg) IBW/kg (Calculated) : 54.7  Temp (24hrs), Avg:98.5 F (36.9 C), Min:98.5 F (36.9 C), Max:98.5 F (36.9 C)  Recent Labs  Lab 05/30/18 0540 05/31/18 0413 06/02/18 1317 06/02/18 1517  WBC 9.6 10.1 8.3  --   CREATININE 0.67  --  0.74  --   LATICACIDVEN  --   --  2.8* 2.1*    Estimated Creatinine Clearance: 70.7 mL/min (by C-G formula based on SCr of 0.74 mg/dL).    Allergies  Allergen Reactions  . Lisinopril Cough  . Nitrofurantoin Other (See Comments)    Pt unsure  . Oxycodone-Acetaminophen Itching  . Atorvastatin Diarrhea    May have caused colitis--but not clear cut  . Clarithromycin Palpitations  . Glipizide Other (See Comments)    Sugars too variable    Antimicrobials this admission: 7/14 Vancomycin >>  7/14 Zosyn >>   Microbiology results: 7/14 BCx: pending 7/14 UCx: pending  Thank you for allowing pharmacy to be a part of this patient's care.  Pernell Dupre, PharmD, BCPS Clinical Pharmacist 06/02/2018 5:53 PM

## 2018-06-02 NOTE — ED Notes (Signed)
Lactic acid 2.8 first RN and charge RN informed.

## 2018-06-02 NOTE — Progress Notes (Signed)
Pt admitted to room 146 from ED. Pt alert and oriented X4. Aquacel dressing in place. Ice pack applied to right hip. Bed in lowest position, call bell in reach bed and  alarm on.

## 2018-06-02 NOTE — ED Triage Notes (Addendum)
Pt states total hip replacement on Wednesday, was DC Friday. States leg swelling and leg pain. On R side. Alert, oriented, in wheelchair. Wants to be checked for blood clot. States fever at home, 100.7. Took tylenol. Was given pain medicine, last dose 12. Incision site has bandage on it. No redness noted around bandage. Taking baby ASA.

## 2018-06-02 NOTE — ED Provider Notes (Signed)
[facility] Emergency Department Provider Note  ____________________________________________  Time seen: Approximately 5:16 PM  I have reviewed the triage vital signs and the nursing notes.   HISTORY  Chief Complaint No chief complaint on file.   HPI Shannon [name] is a 62 y.o. female postop day 4 from a right hip replacement with Dr. Harlow Mares who presents for evaluation of fever and worsening pain.  Patient reports that she had a fever of 100.33F today.  She had a couple of episodes of low-grade fever while in the hospital however after going home she had no fevers for 24 hours.  She also complaining that her pain got worse which is now severe, located at the surgical site and radiating down to her knee, the pain is sharp and constant.  She has been taking Vicodin which she was given by Dr. Harlow Mares with no significant relief.  She is still able to walk and bear weight.  She has had no vomiting, no nausea, no shortness of breath, no chest pain, no abdominal pain, no dysuria.  Past Medical History:  Diagnosis Date  . Allergy   . Asthma   . Depression   . Diabetes mellitus 11/12  . Family history of adverse reaction to anesthesia    PROBLEMS WITH BLOCKS  . GERD (gastroesophageal reflux disease)   . Glaucoma suspect   . Headache   . Hyperlipidemia   . Hypertension   . Mitral valve prolapse   . Osteopenia   . Shingles 11/2006  . Sleep disturbance   . Tachycardia     Patient Active Problem List   Diagnosis Date Noted  . Status post total hip replacement, right 05/29/2018  . Preoperative evaluation to rule out surgical contraindication 05/20/2018  . Headache 05/20/2018  . Low back pain 06/18/2017  . Fatigue 04/20/2017  . 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 Colitis 03/17/2014  . Routine general medical examination at a health care facility 09/16/2012  . Hyperlipidemia   . Type 2 diabetes mellitus with neurological manifestations, controlled ([place])   .  FIBROCYSTIC BREAST DISEASE 06/12/2007  . GLAUCOMA 05/30/2007  . Essential hypertension, benign 05/30/2007  . MITRAL VALVE PROLAPSE 05/30/2007  . ASTHMA 05/30/2007  . OSTEOPENIA 05/30/2007  . ALLERGIC RHINITIS 03/20/2007  . GERD 03/20/2007  . Sleep disturbance 03/20/2007    Past Surgical History:  Procedure Laterality Date  . ABDOMINAL HYSTERECTOMY  2000   /BSO - abn. [place]  . APPENDECTOMY  1978  . AUGMENTATION MAMMAPLASTY Bilateral 1981  . BLADDER REPAIR     x2  . BREAST ENHANCEMENT SURGERY  1987  . [place] Left 12/18/2016   Procedure: Left Heart Cath and Coronary Angiography;  Surgeon: Wellington Hampshire, MD;  Location: [facility];  Service: Cardiovascular;  Laterality: Left;  . CARPAL TUNNEL RELEASE Right   . CERVICAL DISCECTOMY  11/2009   with synthetic discs inserted--  Dr Arnoldo Morale  . NM MYOVIEW LTD  03/2005   EF 56%, no ischemia  . RECTAL PROLAPSE REPAIR     x1  . SHOULDER SURGERY  09/2008, 01/2009   Right shoulder surgery, then redone with lysis of adhesions  . TOTAL HIP ARTHROPLASTY Right 05/29/2018   Procedure: TOTAL HIP ARTHROPLASTY ANTERIOR APPROACH;  Surgeon: Lovell Sheehan, MD;  Location: [facility];  Service: Orthopedics;  Laterality: Right;    Prior to Admission medications   Medication Sig Start Date End Date Taking? Authorizing  Provider  aspirin 81 MG chewable tablet Chew 1 tablet (81 mg total) by mouth 2 (two) times daily. 05/31/18  Yes Lovell Sheehan, MD  aspirin-acetaminophen-caffeine (EXCEDRIN MIGRAINE) [phone] MG tablet Take 1 tablet by mouth every 6 (six) hours as needed for headache.   Yes [provider]  Butalbital-APAP-Caffeine 50-300-40 MG CAPS Take 1-2 capsules by mouth daily as needed. 05/20/18  Yes Venia Carbon, MD  cetirizine (ZYRTEC) 10 MG tablet Take 10 mg by mouth daily.   Yes [provider]  docusate sodium (COLACE) 100 MG capsule Take 300 mg by mouth at bedtime.     Yes [provider]  HYDROcodone-acetaminophen (NORCO/VICODIN) 5-325 MG tablet Take 1 tablet by mouth every 4 (four) hours as needed for moderate pain (pain score 4-6). 05/31/18  Yes Lovell Sheehan, MD  LUMIGAN 0.01 % SOLN Place 1 drop into both eyes at bedtime.  10/13/15  Yes [provider]  metFORMIN (GLUCOPHAGE) 1000 MG tablet Take 1 tablet (1,000 mg total) by mouth daily with breakfast. 02/18/18  Yes Venia Carbon, MD  metoprolol succinate (TOPROL-XL) 25 MG 24 hr tablet Take 1.5 tablets (37.5 mg total) by mouth daily. 05/03/17  Yes Viviana Simpler I, MD  ondansetron (ZOFRAN) 4 MG tablet TAKE 1 TABLET BY MOUTH THREE TIMES DAILY AS NEEDED FOR NAUSEA OR VOMITING 09/19/17  Yes Venia Carbon, MD  pantoprazole (PROTONIX) 40 MG tablet Take 1 tablet (40 mg total) by mouth 2 (two) times daily. On an empty stomach 02/13/18  Yes Venia Carbon, MD  traZODone (DESYREL) 100 MG tablet Take 2 tablets (200 mg total) by mouth at bedtime. 08/14/17  Yes Venia Carbon, MD  triamterene-hydrochlorothiazide (MAXZIDE-25) 37.5-25 MG tablet TAKE 1 TABLET BY MOUTH ONCE A DAY 12/20/17  Yes Venia Carbon, MD  valACYclovir (VALTREX) 1000 MG tablet TAKE 2 TABLETS BY MOUTH  DAILY AS NEEDED (AT THE  ONSET OF SYMPTOMS AND TAKE  2 TABS 12 HOURS LATER) Patient taking differently: TAKE 2 TABLETS BY MOUTH  DAILY AS NEEDED (AT THE  ONSET OF SYMPTOMS AND TAKE  2 TABS 12 HOURS LATER) FOR FEVER BLISTERS. 03/21/18  Yes Viviana Simpler I, MD  glucose blood (ONE TOUCH ULTRA TEST) test strip USE ONE STRIP TO CHECK GLUCOSE ONCE DAILY AND AS NEEDED 06/18/17   Venia Carbon, MD  [facility] DELICA LANCETS FINE MISC Check blood sugar once daily and as needed. 250.00 05/26/14   Venia Carbon, MD    Allergies Lisinopril; Nitrofurantoin; Oxycodone-acetaminophen; Atorvastatin; Clarithromycin; and Glipizide  Family History  Problem Relation Age of Onset  . Diabetes Mother   . Heart disease Mother   .  Hypertension Mother   . Ovarian cancer Mother   . Melanoma Mother   . Cancer Father   . Coronary artery disease Father   . Heart disease Father   . Diabetes Sister   . Depression Sister   . Coronary artery disease Sister   . Heart disease Sister   . Rheum arthritis Maternal Aunt   . Breast cancer Maternal Grandmother   . Diabetes Maternal Grandmother   . Heart attack Paternal Uncle   . Heart attack Cousin   . Heart attack Paternal Uncle   . Diabetes Maternal Grandfather   . Colon cancer Neg Hx     Social History Social History   Tobacco Use  . Smoking status: Never Smoker  . Smokeless tobacco: Never Used  Substance Use Topics  . Alcohol use: No  .  Drug use: No    Review of Systems  Constitutional: + fever. Eyes: Negative for visual changes. ENT: Negative for sore throat. Neck: No neck pain  Cardiovascular: Negative for chest pain. Respiratory: Negative for shortness of breath. Gastrointestinal: Negative for abdominal pain, vomiting or diarrhea. Genitourinary: Negative for dysuria. Musculoskeletal: Negative for back pain. + R leg pain Skin: Negative for rash. Neurological: Negative for headaches, weakness or numbness. Psych: No SI or HI  ____________________________________________   PHYSICAL EXAM:  VITAL SIGNS: ED Triage Vitals [06/02/18 1311]  Enc Vitals Group     BP 130/62     Pulse Rate (!) 103     Resp 20     Temp 98.5 F (36.9 C)     Temp Source Oral     SpO2 99 %     Weight 149 lb (67.6 kg)     Height 5\' 4"  (1.626 m)     Head Circumference      Peak Flow      Pain Score 8     Pain Loc      Pain Edu?      Excl. in [place]?     Constitutional: Alert and oriented. Well appearing and in no apparent distress. HEENT:      Head: Normocephalic and atraumatic.         Eyes: Conjunctivae are normal. Sclera is non-icteric.       Mouth/Throat: Mucous membranes are moist.       Neck: Supple with no signs of meningismus. Cardiovascular: Regular rate and  rhythm. No murmurs, gallops, or rubs. 2+ symmetrical distal pulses are present in all extremities. No JVD. Respiratory: Normal respiratory effort. Lungs are clear to auscultation bilaterally. No wheezes, crackles, or rhonchi.  Gastrointestinal: Soft, non tender, and non distended with positive bowel sounds. No rebound or guarding. Musculoskeletal: There is no swelling or pitting edema of bilateral lower extremities, patient has decent range of motion of the right hip, surgical site with staples in place and mild erythema overlying the surgical scar, no fluctuance, no purulent discharge, patient is tender to palpation over the surgical scar, patient is able to bear weight and walk.   Neurologic: Normal speech and language. Face is symmetric. Moving all extremities. No gross focal neurologic deficits are appreciated. Skin: Skin is warm, dry and intact. No rash noted. Psychiatric: Mood and affect are normal. Speech and behavior are normal.  ____________________________________________   LABS (all labs ordered are listed, but only abnormal results are displayed)  Labs Reviewed  LACTIC ACID, PLASMA - Abnormal; Notable for the following components:      Result Value   Lactic Acid, Venous 2.8 (*)    All other components within normal limits  LACTIC ACID, PLASMA - Abnormal; Notable for the following components:   Lactic Acid, Venous 2.1 (*)    All other components within normal limits  COMPREHENSIVE METABOLIC PANEL - Abnormal; Notable for the following components:   Glucose, Bld 120 (*)    All other components within normal limits  CBC WITH DIFFERENTIAL/PLATELET - Abnormal; Notable for the following components:   RBC 3.58 (*)    Hemoglobin 10.7 (*)    HCT 30.9 (*)    All other components within normal limits  URINALYSIS, COMPLETE (UACMP) WITH MICROSCOPIC - Abnormal; Notable for the following components:   Color, Urine STRAW (*)    APPearance CLEAR (*)    Hgb urine dipstick SMALL (*)     Leukocytes, UA TRACE (*)    All  other components within normal limits  CULTURE, BLOOD (ROUTINE X 2)  CULTURE, BLOOD (ROUTINE X 2)  URINE CULTURE  POC URINE PREG, ED  POCT PREGNANCY, URINE   ____________________________________________  EKG  ED ECG REPORT I, Rudene Re, the attending physician, personally viewed and interpreted this ECG.  Normal sinus rhythm, rate of 77, normal intervals, normal axis, no ST elevations or depressions.  Normal EKG. ____________________________________________  RADIOLOGY  I have personally reviewed the images performed during this visit and I agree with the Radiologist's read.   Interpretation by Radiologist:  US Venous Img Lower Unilateral Right  Result Date: 06/02/2018 CLINICAL DATA:  62 year old female with a 1 day history of right lower extremity pain and swelling status post right total hip replacement. EXAM: RIGHT LOWER EXTREMITY VENOUS DOPPLER ULTRASOUND TECHNIQUE: Gray-scale sonography with graded compression, as well as color Doppler and duplex ultrasound were performed to evaluate the lower extremity deep venous systems from the level of the common femoral vein and including the common femoral, femoral, profunda femoral, popliteal and calf veins including the posterior tibial, peroneal and gastrocnemius veins when visible. The superficial great saphenous vein was also interrogated. Spectral Doppler was utilized to evaluate flow at rest and with distal augmentation maneuvers in the common femoral, femoral and popliteal veins. COMPARISON:  None. FINDINGS: Contralateral Common Femoral Vein: Respiratory phasicity is normal and symmetric with the symptomatic side. No evidence of thrombus. Normal compressibility. Common Femoral Vein: No evidence of thrombus. Normal compressibility, respiratory phasicity and response to augmentation. Saphenofemoral Junction: No evidence of thrombus. Normal compressibility and flow on color Doppler imaging. Profunda  Femoral Vein: No evidence of thrombus. Normal compressibility and flow on color Doppler imaging. Femoral Vein: No evidence of thrombus. Normal compressibility, respiratory phasicity and response to augmentation. Popliteal Vein: No evidence of thrombus. Normal compressibility, respiratory phasicity and response to augmentation. Calf Veins: No evidence of thrombus. Normal compressibility and flow on color Doppler imaging. Superficial Great Saphenous Vein: No evidence of thrombus. Normal compressibility. Venous Reflux:  None. Other Findings:  None. IMPRESSION: No evidence of deep venous thrombosis. Electronically Signed   By: Jacqulynn Cadet M.D.   On: 06/02/2018 14:36   Dg Chest Portable 1 View  Result Date: 06/02/2018 CLINICAL DATA:  Fever.  Recent right hip replacement. EXAM: PORTABLE CHEST 1 VIEW COMPARISON:  Chest x-ray dated December 15, 2016. FINDINGS: The heart size and mediastinal contours are within normal limits. Normal pulmonary vascularity. No focal consolidation, pleural effusion, or pneumothorax. No acute osseous abnormality. Prior cervical fusion and right rotator cuff repair. IMPRESSION: No active disease. Electronically Signed   By: Titus Dubin M.D.   On: 06/02/2018 15:35     ____________________________________________   PROCEDURES  Procedure(s) performed: None Procedures Critical Care performed:  None ____________________________________________   INITIAL IMPRESSION / ASSESSMENT AND PLAN / ED COURSE  62 y.o. female postop day 4 from a right hip replacement with Dr. Harlow Mares who presents for evaluation of fever and worsening pain.  Surgical side looks pretty unremarkable with mild erythema which could be normal in the setting of recent surgery, there is no purulent discharge, she has mild tenderness but no fluctuance on exam, she has great range of motion and is able to bear weight on that hip.  There is no swelling or erythema of the leg.  Doppler studies negative for DVT.   Patient here was afebrile but slightly tachycardic.  She reports taking Tylenol at home before coming to the emergency room.  Labs showing normal white count  and a lactic of 2.8.  Chest x-ray and urinalysis negative for an infection.  Blood cultures are pending.  Discussed with Dr. Harlow Mares, patient's surgeon who says an elevated lactic postop day 4 is not atypical however he recommended admission for observation and asked that I start patient on broad-spectrum antibiotics.  We will give Zosyn and vanc.       As part of my medical decision making, I reviewed the following data within the [place] notes reviewed and incorporated, Labs reviewed , EKG interpreted , Old chart reviewed, Radiograph reviewed , A consult was requested and obtained from this/these consultant(s) Orthopedics, Notes from prior ED visits and [place] Controlled Substance Database    Pertinent labs & imaging results that were available during my care of the patient were reviewed by me and considered in my medical decision making (see chart for details).    ____________________________________________   FINAL CLINICAL IMPRESSION(S) / ED DIAGNOSES  Final diagnoses:  Post-op pain  Fever, unspecified fever cause      NEW MEDICATIONS STARTED DURING THIS VISIT:  ED Discharge Orders    None       Note:  This document was prepared using Dragon voice recognition software and may include unintentional dictation errors.    Alfred Levins, [place], MD 06/02/18 [phone]

## 2018-06-02 NOTE — Progress Notes (Signed)
CODE SEPSIS - PHARMACY COMMUNICATION  **Broad Spectrum Antibiotics should be administered within 1 hour of Sepsis diagnosis**  Time Code Sepsis Called/Page Received: @ 1734  Antibiotics Ordered: Vancomycin 1g                                     Zosyn 3.375  Time of 1st antibiotic administration: @ 9672  Additional action taken by pharmacy: N/A  If necessary, Name of Provider/Nurse Contacted: N/A   Pernell Dupre, PharmD, BCPS Clinical Pharmacist 06/02/2018 5:48 PM

## 2018-06-03 ENCOUNTER — Telehealth: Payer: Self-pay

## 2018-06-03 LAB — GLUCOSE, CAPILLARY
Glucose-Capillary: 128 mg/dL — ABNORMAL HIGH (ref 70–99)
Glucose-Capillary: 132 mg/dL — ABNORMAL HIGH (ref 70–99)

## 2018-06-03 MED ORDER — GABAPENTIN 300 MG PO CAPS: 300.0000 mg | ORAL_CAPSULE | Freq: Three times a day (TID) | ORAL | 0 refills | Status: DC

## 2018-06-03 MED ORDER — DIAZEPAM 5 MG PO TABS: 5.0000 mg | ORAL_TABLET | Freq: Four times a day (QID) | ORAL | 0 refills | Status: DC | PRN

## 2018-06-03 MED ORDER — HYDROCODONE-ACETAMINOPHEN 10-325 MG PO TABS: 1.0000 | ORAL_TABLET | ORAL | 0 refills | Status: DC | PRN

## 2018-06-03 MED ORDER — SULFAMETHOXAZOLE-TRIMETHOPRIM 800-160 MG PO TABS: 1.0000 | ORAL_TABLET | Freq: Two times a day (BID) | ORAL | 1 refills | Status: DC

## 2018-06-03 NOTE — Progress Notes (Signed)
Pt in no acute distress. VSS. RN removed IV. RN explained discharge instructions to pt and family and pt verbalized understanding. Wheeled to visitors entrance by NT and assisted into vehicle.

## 2018-06-03 NOTE — Telephone Encounter (Signed)
Called pt to see how she was doing after her hip Replacement Surgery on 05-31-18. Spoke to pt's husband per DPR. He said she went to the hospital last night for pain control. May come home today because she is doing better.

## 2018-06-03 NOTE — Telephone Encounter (Signed)
Sorry to hear she had to go back---but glad things are better Will follow up on her

## 2018-06-03 NOTE — Progress Notes (Signed)
Physical Therapy Evaluation Patient Details Name: Shannon [name] MRN: [id number] DOB: Dec 30, 1955 Today's Date: 06/03/2018   History of Present Illness  Pt currently with functional limitations due to the deficits listed below (see PT Problem List). Shannon [name] is a 62 y.o. female who presented with fever, chills and increasing pain after right total hip on 05/29/2018. Symptoms were rated as moderate to severe, and have been worsening however today she reports significant improvement in her pain. She has had a low grade fever. She has been ambulating without difficulty but has had muscle spasms and increasing pain.   Clinical Impression  Pt admitted with above diagnosis. Pt reporting significant improvement in her pain and swelling upon arrival by PT today. She demonstrates safe hand placement and good weight shift to RLE during transfers. Slight increase in time but improved from her transfers during previous admission. Pt denies increase in hip pain and is steady and stable in standing. Pt is able to ambulate to stairway as well as complete a full lap around RN station. Mildly antalgic gait on the right but uses step-through pattern with light UE support on walker. Reports mild increase in pain with ambulation but no DOE noted. Speed is functional for full household mobility. Pt is able to ascend and descend 14 steps with therapist (has 12 at home). She uses left rail during ascend and hand-held support from therapist with [place]. Pt is safe and stable with stair training and reports only minimal increase in pain. No safety concerns noted. Her R thigh and calf are soft and not tender to palpation/squeeze. Pt is safe to return home when medically appropriate with OP PT follow-up. Pt will benefit from PT services to address deficits in strength, balance, and mobility in order to return to full function at home.       Follow Up Recommendations Outpatient PT    Equipment Recommendations  None  recommended by PT    Recommendations for Other Services       Precautions / Restrictions Precautions Precautions: Anterior Hip Precaution Booklet Issued: Yes (comment) Restrictions Weight Bearing Restrictions: Yes RLE Weight Bearing: Weight bearing as tolerated      Mobility  Bed Mobility               General bed mobility comments: Received and left upright in recliner  Transfers Overall transfer level: Needs assistance Equipment used: Rolling walker (2 wheeled) Transfers: Sit to/from [employer] Transfers Sit to Stand: Supervision Stand pivot transfers: Supervision       General transfer comment: Safe hand placement and good weight shift to RLE. Slight increase in time but improved from her transfers during admission. Pt denies increase in hip pain and is steady and stable in standing  Ambulation/Gait Ambulation/Gait assistance: Min guard;Supervision Gait Distance (Feet): 250 Feet Assistive device: Rolling walker (2 wheeled)       General Gait Details: Pt is able to ambulate to stairway as well as complete a full lap around RN station. Mildly antalgic gait on the right but uses step-through pattern with light UE support on walker. Reports mild increase in pain with ambulation but no DOE noted. Speed is functional for full household mobility  Stairs Stairs: Yes Stairs assistance: Min guard Stair Management: One rail Left;Step to pattern;Forwards Number of Stairs: 14 General stair comments: Pt is able to ascend and descend 14 steps with therapist (has 12 at home). She uses left rail during ascend and hand-held support from therapist with [place]. Pt is safe  and stable with stair training and reports only minimal increase in pain. No safety concerns noted  Wheelchair Mobility    Modified Rankin (Stroke Patients Only)       Balance Overall balance assessment: Needs assistance Sitting-balance support: No upper extremity supported Sitting balance-Leahy  Scale: Normal Sitting balance - Comments: steady sitting reaching outside BOS   Standing balance support: No upper extremity supported Standing balance-Leahy Scale: Good Standing balance comment: able to stand and reach within BOS without LOB                             Pertinent Vitals/Pain Pain Assessment: 0-10 Pain Score: 4  Pain Location: R hip Pain Descriptors / Indicators: Aching;Burning Pain Intervention(s): Monitored during session;Premedicated before session    Home Living Family/patient expects to be discharged to:: Private residence Living Arrangements: Spouse/significant other;Children Available Help at Discharge: Family Type of Home: House Home Access: Ramped entrance     Home Layout: Two level;Bed/bath upstairs;Other (Comment)(bathroom on main but bedroom upstairs) Home Equipment: Wheelchair - manual      Prior Function Level of Independence: Independent         Comments: Full community ambulator without assistive device.  No falls.  Drives.  Fully independent with ADLs/IADLs.     Hand Dominance   Dominant Hand: Right    Extremity/Trunk Assessment   Upper Extremity Assessment Upper Extremity Assessment: Overall WFL for tasks assessed    Lower Extremity Assessment RLE Deficits / Details: Pt requires assist for R hip flexion but otherwise R knee flexion/extension WFL. No tenderness to palpation of R thigh, knee, or calf. No appreciable swelling noted       Communication   Communication: No difficulties  Cognition Arousal/Alertness: Awake/alert Behavior During Therapy: WFL for tasks assessed/performed Overall Cognitive Status: Within Functional Limits for tasks assessed                                 General Comments: Follows commands consistently      General Comments      Exercises Total Joint Exercises Ankle Circles/Pumps: Both;Seated;15 reps;Other (comment)(Heel raises) Hip ABduction/ADduction: Both;15  reps;Seated Long Arc Quad: Right;15 reps;Seated Knee Flexion: Right;15 reps;Seated Marching in Standing: Both;10 reps;Seated   Assessment/Plan    PT Assessment Patient needs continued PT services  PT Problem List Decreased strength;Decreased range of motion;Decreased balance;Decreased activity tolerance;Decreased mobility       PT Treatment Interventions DME instruction;Gait training;Functional mobility training;Therapeutic activities;Therapeutic exercise;Balance training;Neuromuscular re-education;Patient/family education    PT Goals (Current goals can be found in the Care Plan section)  Acute Rehab PT Goals Patient Stated Goal: Return to prior level of function  PT Goal Formulation: With patient/family Time For Goal Achievement: 06/17/18 Potential to Achieve Goals: Good    Frequency BID   Barriers to discharge        Co-evaluation               AM-PAC PT "6 Clicks" Daily Activity  Outcome Measure Difficulty turning over in bed (including adjusting bedclothes, sheets and blankets)?: A Little Difficulty moving from lying on back to sitting on the side of the bed? : A Little Difficulty sitting down on and standing up from a chair with arms (e.g., wheelchair, bedside commode, etc,.)?: A Little Help needed moving to and from a bed to chair (including a wheelchair)?: A Little Help needed walking in hospital room?:  A Little Help needed climbing 3-5 steps with a railing? : A Little 6 Click Score: 18    End of Session Equipment Utilized During Treatment: Gait belt Activity Tolerance: Patient tolerated treatment well Patient left: with call bell/phone within reach;with family/visitor present;in chair Nurse Communication: Mobility status(No pain) PT Visit Diagnosis: Unsteadiness on feet (R26.81);Muscle weakness (generalized) (M62.81);Difficulty in walking, not elsewhere classified (R26.2);Pain Pain - Right/Left: Right Pain - part of body: Hip    Time: 5621-3086 PT Time  Calculation (min) (ACUTE ONLY): 30 min   Charges:   PT Evaluation $PT Eval Low Complexity: 1 Low PT Treatments $Gait Training: 8-22 mins $Therapeutic Exercise: 8-22 mins   PT G Codes:        Stehanie Ekstrom D Albirta Rhinehart PT, DPT, GCS   Jyla Hopf 06/03/2018, 10:30 AM

## 2018-06-03 NOTE — Discharge Summary (Signed)
Physician Discharge Summary  Patient ID: Shannon [name] MRN: [id number] DOB/AGE: Mar 26, 1956 62 y.o.  Admit date: 06/02/2018 Discharge date: 06/03/2018  Admission Diagnoses:  * No surgery found * <principal problem not specified>  Discharge Diagnoses:  * No surgery found * Active Problems:   Fever and chills   Past Medical History:  Diagnosis Date  . Allergy   . Asthma   . Depression   . Diabetes mellitus 11/12  . Family history of adverse reaction to anesthesia    PROBLEMS WITH BLOCKS  . GERD (gastroesophageal reflux disease)   . Glaucoma suspect   . Headache   . Hyperlipidemia   . Hypertension   . Mitral valve prolapse   . Osteopenia   . Shingles 11/2006  . Sleep disturbance   . Tachycardia     Surgeries:  on * No surgery found *   Consultants (if any):   Discharged Condition: Improved  Hospital Course: Shannon [name] is an 62 y.o. female who was admitted 06/02/2018 with a diagnosis of  * No surgery found * <principal problem not specified> and went to the operating room on * No surgery found * and underwent the above named procedures.    She was given perioperative antibiotics:  Anti-infectives (From admission, onward)   Start     Dose/Rate Route Frequency Ordered Stop   06/03/18 0000  vancomycin (VANCOCIN) IVPB 1000 mg/200 mL premix     1,000 mg 200 mL/hr over 60 Minutes Intravenous Every 12 hours 06/02/18 1755     06/03/18 0000  sulfamethoxazole-trimethoprim (BACTRIM DS,SEPTRA DS) 800-160 MG tablet     1 tablet Oral 2 times daily 06/03/18 1246     06/02/18 2200  piperacillin-tazobactam (ZOSYN) IVPB 3.375 g     3.375 g 12.5 mL/hr over 240 Minutes Intravenous Every 8 hours 06/02/18 1755     06/02/18 1745  piperacillin-tazobactam (ZOSYN) IVPB 3.375 g  Status:  Discontinued     3.375 g 100 mL/hr over 30 Minutes Intravenous  Once 06/02/18 1733 06/02/18 1744   06/02/18 1745  vancomycin (VANCOCIN) IVPB 1000 mg/200 mL premix     1,000 mg 200 mL/hr over 60  Minutes Intravenous  Once 06/02/18 1733 06/02/18 1847   06/02/18 1717  valACYclovir (VALTREX) tablet 1,000 mg     1,000 mg Oral Every 12 hours PRN 06/02/18 1733     06/02/18 1700  piperacillin-tazobactam (ZOSYN) IVPB 3.375 g     3.375 g 100 mL/hr over 30 Minutes Intravenous  Once 06/02/18 1651 06/02/18 1737   06/02/18 1700  vancomycin (VANCOCIN) IVPB 1000 mg/200 mL premix  Status:  Discontinued     1,000 mg 200 mL/hr over 60 Minutes Intravenous  Once 06/02/18 1651 06/02/18 1733    .  She was given sequential compression devices, early ambulation, and ECASA for DVT prophylaxis.  She benefited maximally from the hospital stay and there were no complications.    Recent vital signs:  Vitals:   06/02/18 2327 06/03/18 0737  BP: 113/69 121/74  Pulse: 93 79  Resp:    Temp: 98.4 F (36.9 C)   SpO2: 94% 97%    Recent laboratory studies:  Lab Results  Component Value Date   HGB 10.7 (L) 06/02/2018   HGB 10.1 (L) 05/31/2018   HGB 10.7 (L) 05/30/2018   Lab Results  Component Value Date   WBC 8.3 06/02/2018   PLT 323 06/02/2018   Lab Results  Component Value Date   INR 0.89 05/15/2018   Lab  Results  Component Value Date   NA 142 06/02/2018   K 3.9 06/02/2018   CL 104 06/02/2018   CO2 27 06/02/2018   BUN 10 06/02/2018   CREATININE 0.74 06/02/2018   GLUCOSE 120 (H) 06/02/2018    Discharge Medications:   Allergies as of 06/03/2018      Reactions   Lisinopril Cough   Nitrofurantoin Other (See Comments)   Pt unsure   Oxycodone-acetaminophen Itching   Atorvastatin Diarrhea   May have caused colitis--but not clear cut   Clarithromycin Palpitations   Glipizide Other (See Comments)   Sugars too variable      Medication List    STOP taking these medications   HYDROcodone-acetaminophen 5-325 MG tablet Commonly known as:  NORCO/VICODIN Replaced by:  HYDROcodone-acetaminophen 10-325 MG tablet     TAKE these medications   aspirin 81 MG chewable tablet Chew 1 tablet  (81 mg total) by mouth 2 (two) times daily.   aspirin-acetaminophen-caffeine 250-250-65 MG tablet Commonly known as:  EXCEDRIN MIGRAINE Take 1 tablet by mouth every 6 (six) hours as needed for headache.   Butalbital-APAP-Caffeine 50-300-40 MG Caps Take 1-2 capsules by mouth daily as needed.   cetirizine 10 MG tablet Commonly known as:  ZYRTEC Take 10 mg by mouth daily.   diazepam 5 MG tablet Commonly known as:  VALIUM Take 1 tablet (5 mg total) by mouth every 6 (six) hours as needed for muscle spasms.   docusate sodium 100 MG capsule Commonly known as:  COLACE Take 300 mg by mouth at bedtime.   gabapentin 300 MG capsule Commonly known as:  NEURONTIN Take 1 capsule (300 mg total) by mouth 3 (three) times daily.   glucose blood test strip Commonly known as:  ONE TOUCH ULTRA TEST USE ONE STRIP TO CHECK GLUCOSE ONCE DAILY AND AS NEEDED   HYDROcodone-acetaminophen 10-325 MG tablet Commonly known as:  NORCO Take 1 tablet by mouth every 4 (four) hours as needed (breakthrough pain). Replaces:  HYDROcodone-acetaminophen 5-325 MG tablet   LUMIGAN 0.01 % Soln Generic drug:  bimatoprost Place 1 drop into both eyes at bedtime.   metFORMIN 1000 MG tablet Commonly known as:  GLUCOPHAGE Take 1 tablet (1,000 mg total) by mouth daily with breakfast.   metoprolol succinate 25 MG 24 hr tablet Commonly known as:  TOPROL-XL Take 1.5 tablets (37.5 mg total) by mouth daily.   ondansetron 4 MG tablet Commonly known as:  ZOFRAN TAKE 1 TABLET BY MOUTH THREE TIMES DAILY AS NEEDED FOR NAUSEA OR VOMITING   ONETOUCH DELICA LANCETS FINE Misc Check blood sugar once daily and as needed. 250.00   pantoprazole 40 MG tablet Commonly known as:  PROTONIX Take 1 tablet (40 mg total) by mouth 2 (two) times daily. On an empty stomach   sulfamethoxazole-trimethoprim 800-160 MG tablet Commonly known as:  BACTRIM DS,SEPTRA DS Take 1 tablet by mouth 2 (two) times daily.   traZODone 100 MG  tablet Commonly known as:  DESYREL Take 2 tablets (200 mg total) by mouth at bedtime.   triamterene-hydrochlorothiazide 37.5-25 MG tablet Commonly known as:  MAXZIDE-25 TAKE 1 TABLET BY MOUTH ONCE A DAY   valACYclovir 1000 MG tablet Commonly known as:  VALTREX TAKE 2 TABLETS BY MOUTH  DAILY AS NEEDED (AT THE  ONSET OF SYMPTOMS AND TAKE  2 TABS 12 HOURS LATER) What changed:  See the new instructions.       Diagnostic Studies: Dg Pelvis Portable  Result Date: 06/02/2018 CLINICAL DATA:  Status post right  hip replacement on 05/29/2018. Now with severe pain. EXAM: PORTABLE PELVIS 1-2 VIEWS COMPARISON:  05/29/2018. FINDINGS: No fracture. The femoral and acetabular components of the right total hip arthroplasty appear well seated and aligned. No evidence of loosening or infection. There is lateral soft tissue swelling. There is a small amount of residual soft tissue air, decreased from the prior exam. No other soft tissue abnormality. IMPRESSION: 1. No fracture or dislocation. 2. No evidence of loosening or infection of the right total hip arthroplasty. Electronically Signed   By: Lajean Manes M.D.   On: 06/02/2018 18:01   Dg Pelvis Portable  Result Date: 05/29/2018 CLINICAL DATA:  Status post right hip replacement today. EXAM: PORTABLE PELVIS 1-2 VIEWS COMPARISON:  Plain films right hip 03/06/2018. FINDINGS: New right total hip arthroplasty is in place. The device is located. No fracture. Surgical staples and gas in the soft tissues noted. The left hip is unremarkable. IMPRESSION: Status post right total hip arthroplasty.  No acute abnormality. Electronically Signed   By: Inge Rise M.D.   On: 05/29/2018 10:04   US Venous Img Lower Unilateral Right  Result Date: 06/02/2018 CLINICAL DATA:  62 year old female with a 1 day history of right lower extremity pain and swelling status post right total hip replacement. EXAM: RIGHT LOWER EXTREMITY VENOUS DOPPLER ULTRASOUND TECHNIQUE: Gray-scale  sonography with graded compression, as well as color Doppler and duplex ultrasound were performed to evaluate the lower extremity deep venous systems from the level of the common femoral vein and including the common femoral, femoral, profunda femoral, popliteal and calf veins including the posterior tibial, peroneal and gastrocnemius veins when visible. The superficial great saphenous vein was also interrogated. Spectral Doppler was utilized to evaluate flow at rest and with distal augmentation maneuvers in the common femoral, femoral and popliteal veins. COMPARISON:  None. FINDINGS: Contralateral Common Femoral Vein: Respiratory phasicity is normal and symmetric with the symptomatic side. No evidence of thrombus. Normal compressibility. Common Femoral Vein: No evidence of thrombus. Normal compressibility, respiratory phasicity and response to augmentation. Saphenofemoral Junction: No evidence of thrombus. Normal compressibility and flow on color Doppler imaging. Profunda Femoral Vein: No evidence of thrombus. Normal compressibility and flow on color Doppler imaging. Femoral Vein: No evidence of thrombus. Normal compressibility, respiratory phasicity and response to augmentation. Popliteal Vein: No evidence of thrombus. Normal compressibility, respiratory phasicity and response to augmentation. Calf Veins: No evidence of thrombus. Normal compressibility and flow on color Doppler imaging. Superficial Great Saphenous Vein: No evidence of thrombus. Normal compressibility. Venous Reflux:  None. Other Findings:  None. IMPRESSION: No evidence of deep venous thrombosis. Electronically Signed   By: Jacqulynn Cadet M.D.   On: 06/02/2018 14:36   Dg Chest Portable 1 View  Result Date: 06/02/2018 CLINICAL DATA:  Fever.  Recent right hip replacement. EXAM: PORTABLE CHEST 1 VIEW COMPARISON:  Chest x-ray dated December 15, 2016. FINDINGS: The heart size and mediastinal contours are within normal limits. Normal pulmonary  vascularity. No focal consolidation, pleural effusion, or pneumothorax. No acute osseous abnormality. Prior cervical fusion and right rotator cuff repair. IMPRESSION: No active disease. Electronically Signed   By: Titus Dubin M.D.   On: 06/02/2018 15:35   Dg Hip Operative Unilat W Or W/o Pelvis Right  Result Date: 05/29/2018 CLINICAL DATA:  Right hip replacement. EXAM: OPERATIVE RIGHT HIP (WITH PELVIS IF PERFORMED)  VIEWS TECHNIQUE: Fluoroscopic spot image(s) were submitted for interpretation post-operatively. COMPARISON:  Preoperative radiographs 03/06/2018. FINDINGS: Three spot fluoroscopic images are submitted. Fluoroscopy time 25  seconds. Right total hip arthroplasty has been performed with a screw fixed acetabular component. The hardware appears well positioned. No evidence of acute fracture or dislocation. IMPRESSION: No demonstrated complication following right total hip arthroplasty. Electronically Signed   By: Richardean Sale M.D.   On: 05/29/2018 09:21    Disposition: Discharge disposition: 01-Home or Self Care            Signed: Lovell Sheehan ,MD 06/03/2018, 12:47 PM

## 2018-06-06 LAB — URINE CULTURE: Culture: 30000 — AB

## 2018-06-07 ENCOUNTER — Telehealth: Payer: Self-pay

## 2018-06-07 LAB — CULTURE, BLOOD (ROUTINE X 2)
Culture: NO GROWTH
Culture: NO GROWTH

## 2018-06-07 NOTE — Telephone Encounter (Signed)
Flagged on EMMI report for not knowing who to call about changes in condition. Called and spoke with patient to remind her to reach out to Dr. Alla German office if she noticed any changes or had concerns about her condition.  She does have a follow up with her PCP on 06/19/18 at 10:30am.  No other questions or concerns.  I thanked her for her time.

## 2018-06-10 ENCOUNTER — Other Ambulatory Visit: Payer: Self-pay | Admitting: *Deleted

## 2018-06-10 MED ORDER — ONDANSETRON HCL 4 MG PO TABS: ORAL_TABLET | ORAL | 0 refills | Status: DC

## 2018-06-10 NOTE — Telephone Encounter (Signed)
Faxed refill request. Ondansetron Last office visit:   05/20/18 Last Filled:     60 tablet 2 09/19/2017  Please advise.

## 2018-06-10 NOTE — Telephone Encounter (Signed)
Unsure of original reason for ondansetron use, but given recent surgical intervention will refill for short term supply until she's seen by PCP on 06/19/18.

## 2018-06-13 DIAGNOSIS — R2689 Other abnormalities of gait and mobility: Secondary | ICD-10-CM | POA: Diagnosis not present

## 2018-06-13 DIAGNOSIS — M25651 Stiffness of right hip, not elsewhere classified: Secondary | ICD-10-CM | POA: Diagnosis not present

## 2018-06-13 DIAGNOSIS — M25551 Pain in right hip: Secondary | ICD-10-CM | POA: Diagnosis not present

## 2018-06-19 ENCOUNTER — Ambulatory Visit: Payer: 59 | Admitting: Internal Medicine

## 2018-06-19 ENCOUNTER — Encounter: Payer: Self-pay | Admitting: Internal Medicine

## 2018-06-19 VITALS — BP 120/70 | HR 95 | Temp 98.1°F | Ht 65.0 in | Wt 148.0 lb

## 2018-06-19 DIAGNOSIS — E1149 Type 2 diabetes mellitus with other diabetic neurological complication: Secondary | ICD-10-CM

## 2018-06-19 DIAGNOSIS — Z96641 Presence of right artificial hip joint: Secondary | ICD-10-CM | POA: Diagnosis not present

## 2018-06-19 NOTE — Assessment & Plan Note (Signed)
Doing well now Pain much better Mobility is improving----okay to go on beach trip in mid August. Discussed precautions

## 2018-06-19 NOTE — Assessment & Plan Note (Signed)
Control challenged by the recent surgery Will defer checking A1c till follow up

## 2018-06-19 NOTE — Progress Notes (Signed)
Subjective:    Patient ID: Shannon [name], female    DOB: [birth year], 62 y.o.   MRN: [id number]  HPI Here for follow up of diabetes and other chronic health conditions  Had the THR --right Went home after 2 days Then had worsening swelling---concern for DVT (was just on ASA) No DVT Readmitted overnight for pain control Definitely better now Only using hydrocodone once a day Gabapentin has helped-- -has radiating pain to groin that this has helped Used a few diazepam for spasm--done with this  No chest pain No SOB  Sugars ran fairly high with the stress Sugars are in better range--but still elevated compared to her usual Typically 180-200 fasting  Using walker mostly Starting to use the cane a little Outpatient PT at [facility] with all ADLs---doesn't need commode anymore Dresses but hard to get right leg in pants and get shoes on Son/daughter/husband all helping with cooking, housework  Current Outpatient Medications on File Prior to Visit  Medication Sig Dispense Refill  . aspirin 81 MG chewable tablet Chew 1 tablet (81 mg total) by mouth 2 (two) times daily. 30 tablet 0  . aspirin-acetaminophen-caffeine (EXCEDRIN MIGRAINE) 250-250-65 MG tablet Take 1 tablet by mouth every 6 (six) hours as needed for headache.    . cetirizine (ZYRTEC) 10 MG tablet Take 10 mg by mouth daily.    Marland Kitchen docusate sodium (COLACE) 100 MG capsule Take 300 mg by mouth at bedtime.     . gabapentin (NEURONTIN) 300 MG capsule Take 1 capsule (300 mg total) by mouth 3 (three) times daily. 30 capsule 0  . glucose blood (ONE TOUCH ULTRA TEST) test strip USE ONE STRIP TO CHECK GLUCOSE ONCE DAILY AND AS NEEDED 100 each 3  . HYDROcodone-acetaminophen (NORCO) 10-325 MG tablet Take 1 tablet by mouth every 4 (four) hours as needed (breakthrough pain). 30 tablet 0  . LUMIGAN 0.01 % SOLN Place 1 drop into both eyes at bedtime.     . metFORMIN (GLUCOPHAGE) 1000 MG tablet Take 1 tablet (1,000 mg total) by  mouth daily with breakfast. 90 tablet 3  . metoprolol succinate (TOPROL-XL) 25 MG 24 hr tablet Take 1.5 tablets (37.5 mg total) by mouth daily. 135 tablet 3  . ondansetron (ZOFRAN) 4 MG tablet TAKE 1 TABLET BY MOUTH THREE TIMES DAILY AS NEEDED FOR NAUSEA OR VOMITING 15 tablet 0  . ONETOUCH DELICA LANCETS FINE MISC Check blood sugar once daily and as needed. 250.00 100 each 1  . pantoprazole (PROTONIX) 40 MG tablet Take 1 tablet (40 mg total) by mouth 2 (two) times daily. On an empty stomach 180 tablet 3  . traZODone (DESYREL) 100 MG tablet Take 2 tablets (200 mg total) by mouth at bedtime. 30 tablet 0  . triamterene-hydrochlorothiazide (MAXZIDE-25) 37.5-25 MG tablet TAKE 1 TABLET BY MOUTH ONCE A DAY 90 tablet 3  . valACYclovir (VALTREX) 1000 MG tablet TAKE 2 TABLETS BY MOUTH  DAILY AS NEEDED (AT THE  ONSET OF SYMPTOMS AND TAKE  2 TABS 12 HOURS LATER) (Patient taking differently: TAKE 2 TABLETS BY MOUTH  DAILY AS NEEDED (AT THE  ONSET OF SYMPTOMS AND TAKE  2 TABS 12 HOURS LATER) FOR FEVER BLISTERS.) 90 tablet 0   No current facility-administered medications on file prior to visit.     Allergies  Allergen Reactions  . Lisinopril Cough  . Nitrofurantoin Other (See Comments)    Pt unsure  . Oxycodone-Acetaminophen Itching  . Atorvastatin Diarrhea    May have caused colitis--but  not clear cut  . Clarithromycin Palpitations  . Glipizide Other (See Comments)    Sugars too variable    Past Medical History:  Diagnosis Date  . Allergy   . Asthma   . Depression   . Diabetes mellitus 11/12  . Family history of adverse reaction to anesthesia    PROBLEMS WITH BLOCKS  . GERD (gastroesophageal reflux disease)   . Glaucoma suspect   . Headache   . Hyperlipidemia   . Hypertension   . Mitral valve prolapse   . Osteopenia   . Shingles 11/2006  . Sleep disturbance   . Tachycardia     Past Surgical History:  Procedure Laterality Date  . ABDOMINAL HYSTERECTOMY  2000   /BSO - abn. [facility]  .  APPENDECTOMY  1978  . AUGMENTATION MAMMAPLASTY Bilateral 1981  . BLADDER REPAIR     x2  . BREAST ENHANCEMENT SURGERY  1987  . [place] Left 12/18/2016   Procedure: Left Heart Cath and Coronary Angiography;  Surgeon: Wellington Hampshire, MD;  Location: [place] [facility];  Service: Cardiovascular;  Laterality: Left;  . CARPAL TUNNEL RELEASE Right   . CERVICAL DISCECTOMY  11/2009   with synthetic discs inserted--  Dr Arnoldo Morale  . NM MYOVIEW LTD  03/2005   EF 56%, no ischemia  . RECTAL PROLAPSE REPAIR     x1  . SHOULDER SURGERY  09/2008, 01/2009   Right shoulder surgery, then redone with lysis of adhesions  . TOTAL HIP ARTHROPLASTY Right 05/29/2018   Procedure: TOTAL HIP ARTHROPLASTY ANTERIOR APPROACH;  Surgeon: Lovell Sheehan, MD;  Location: [facility];  Service: Orthopedics;  Laterality: Right;    Family History  Problem Relation Age of Onset  . Diabetes Mother   . Heart disease Mother   . Hypertension Mother   . Ovarian cancer Mother   . Melanoma Mother   . Cancer Father   . Coronary artery disease Father   . Heart disease Father   . Diabetes Sister   . Depression Sister   . Coronary artery disease Sister   . Heart disease Sister   . Rheum arthritis Maternal Aunt   . Breast cancer Maternal Grandmother   . Diabetes Maternal Grandmother   . Heart attack Paternal Uncle   . Heart attack Cousin   . Heart attack Paternal Uncle   . Diabetes Maternal Grandfather   . Colon cancer Neg Hx     Social History   Socioeconomic History  . Marital status: Married    Spouse name: Not on file  . Number of children: 5  . Years of education: Not on file  . Highest education level: Not on file  Occupational History  . Occupation: homemaker  Social Needs  . Financial resource strain: Not on file  . Food insecurity:    Worry: Not on file    Inability: Not on file  . Transportation needs:    Medical: Not on file    Non-medical: Not on  file  Tobacco Use  . Smoking status: Never Smoker  . Smokeless tobacco: Never Used  Substance and Sexual Activity  . Alcohol use: No  . Drug use: No  . Sexual activity: Not on file  Lifestyle  . Physical activity:    Days per week: Not on file    Minutes per session: Not on file  . Stress: Not on file  Relationships  . Social connections:  Talks on phone: Not on file    Gets together: Not on file    Attends religious service: Not on file    Active member of club or organization: Not on file    Attends meetings of clubs or organizations: Not on file    Relationship status: Not on file  . Intimate partner violence:    Fear of current or ex partner: Not on file    Emotionally abused: Not on file    Physically abused: Not on file    Forced sexual activity: Not on file  Other Topics Concern  . Not on file  Social History Narrative   Home maker   Doesn't exercise      [name] sister         Review of Systems Appetite is off some Weight is stable    Objective:   Physical Exam  Cardiovascular: Normal rate, regular rhythm and normal heart sounds. Exam reveals no gallop.  No murmur heard. Respiratory: Effort normal and breath sounds normal. No respiratory distress. She has no wheezes. She has no rales.  Musculoskeletal: She exhibits no edema.  No calf tenderness  Neurological:  Fairly normal gait with small steps  Psychiatric: She has a normal mood and affect. Her behavior is normal.           Assessment & Plan:

## 2018-06-21 DIAGNOSIS — M25651 Stiffness of right hip, not elsewhere classified: Secondary | ICD-10-CM | POA: Diagnosis not present

## 2018-06-21 DIAGNOSIS — R2689 Other abnormalities of gait and mobility: Secondary | ICD-10-CM | POA: Diagnosis not present

## 2018-06-21 DIAGNOSIS — M25551 Pain in right hip: Secondary | ICD-10-CM | POA: Diagnosis not present

## 2018-06-24 DIAGNOSIS — M25651 Stiffness of right hip, not elsewhere classified: Secondary | ICD-10-CM | POA: Diagnosis not present

## 2018-06-24 DIAGNOSIS — M25551 Pain in right hip: Secondary | ICD-10-CM | POA: Diagnosis not present

## 2018-06-24 DIAGNOSIS — R2689 Other abnormalities of gait and mobility: Secondary | ICD-10-CM | POA: Diagnosis not present

## 2018-06-25 ENCOUNTER — Other Ambulatory Visit: Payer: Self-pay | Admitting: Internal Medicine

## 2018-06-25 DIAGNOSIS — R000 Tachycardia, unspecified: Secondary | ICD-10-CM

## 2018-06-26 DIAGNOSIS — R2689 Other abnormalities of gait and mobility: Secondary | ICD-10-CM | POA: Diagnosis not present

## 2018-06-26 DIAGNOSIS — M25551 Pain in right hip: Secondary | ICD-10-CM | POA: Diagnosis not present

## 2018-06-26 DIAGNOSIS — M25651 Stiffness of right hip, not elsewhere classified: Secondary | ICD-10-CM | POA: Diagnosis not present

## 2018-07-02 DIAGNOSIS — M25651 Stiffness of right hip, not elsewhere classified: Secondary | ICD-10-CM | POA: Diagnosis not present

## 2018-07-02 DIAGNOSIS — M25551 Pain in right hip: Secondary | ICD-10-CM | POA: Diagnosis not present

## 2018-07-02 DIAGNOSIS — R2689 Other abnormalities of gait and mobility: Secondary | ICD-10-CM | POA: Diagnosis not present

## 2018-07-10 DIAGNOSIS — J019 Acute sinusitis, unspecified: Secondary | ICD-10-CM | POA: Diagnosis not present

## 2018-07-16 DIAGNOSIS — Z96642 Presence of left artificial hip joint: Secondary | ICD-10-CM | POA: Diagnosis not present

## 2018-07-25 ENCOUNTER — Telehealth: Payer: Self-pay

## 2018-07-25 MED ORDER — SUCRALFATE 1 G PO TABS: 1.0000 g | ORAL_TABLET | Freq: Three times a day (TID) | ORAL | 3 refills | Status: DC

## 2018-07-25 NOTE — Telephone Encounter (Signed)
Copied from [place] [id number]. Topic: General - Other >> Jul 25, 2018  4:18 PM Carolyn Stare wrote:  Pt is calling to ask if Dr Silvio Pate will call her in the below med. She said she has not hadthe med since 2017   Sucralfate   [facility] on [address]

## 2018-07-25 NOTE — Telephone Encounter (Signed)
That would be fine to phone that in It is usually taken tid before eating (and sometimes at night as well) Find out what is going on that she wants to try it---if just mild symptoms, okay to try, if more severe, should also set up appt  #90 or #120 x 3

## 2018-07-25 NOTE — Telephone Encounter (Signed)
Spoke to pt. She said she is having bloating and similar symptoms as she had several years ago and was given the sucralfate. I sent in the rx.

## 2018-08-02 IMAGING — MG MM DIGITAL DIAGNOSTIC UNILAT*R* IMPLANT W/ TOMO W/ CAD
8 of 16 series · 8 of 36 positions shown · non-contrast
Comparison: Previous exam(s).

CLINICAL DATA: Callback from screening mammogram for possible right
breast mass. The patient also reports a persistent palpable
abnormality in the inferior right breast which she states is larger
and more prominent than it was in 9089.

EXAM:
2D DIGITAL DIAGNOSTIC RIGHT MAMMOGRAM WITH IMPLANTS, CAD AND ADJUNCT
TOMO
ULTRASOUND RIGHT BREAST

[R CC (1 of 3)]
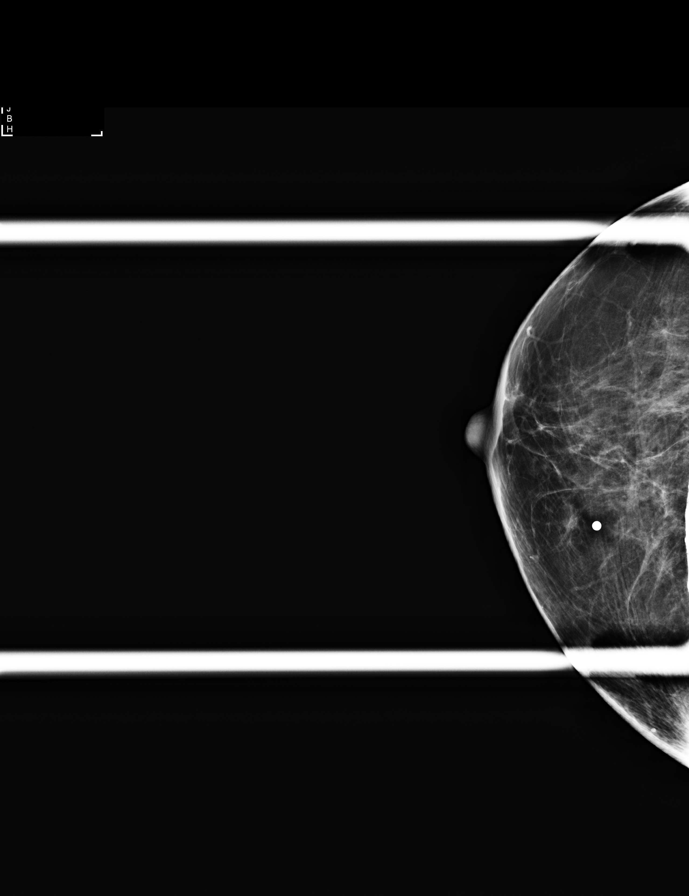

[R MLO synth-2D]
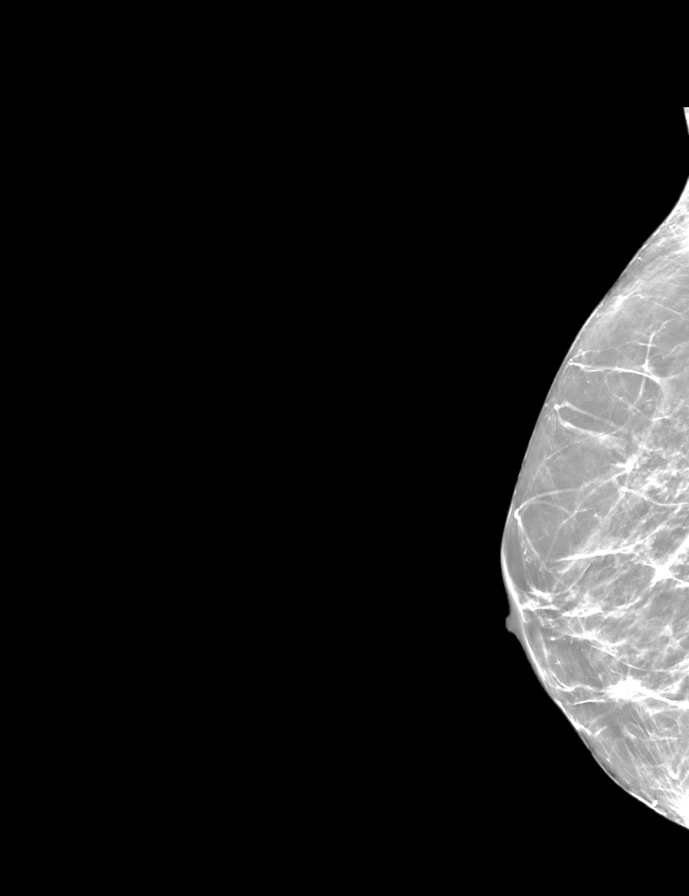

[R CC synth-2D]
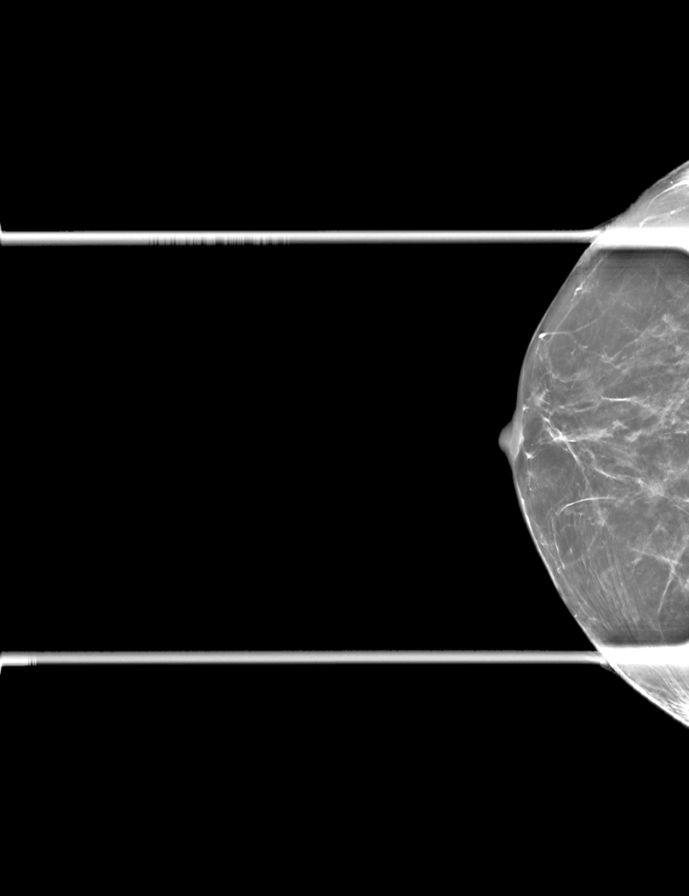

[R MLO (1 of 3)]
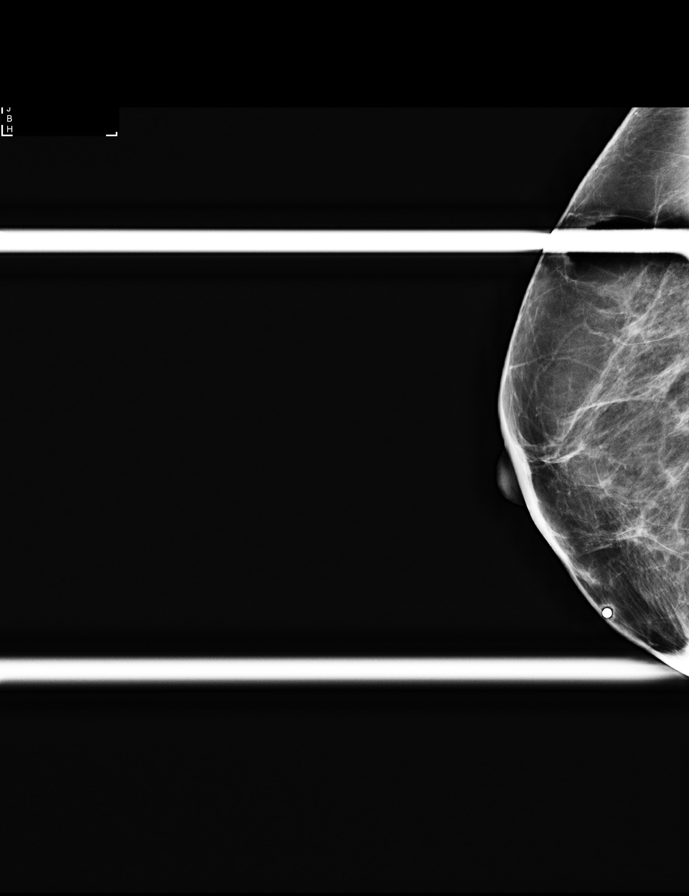

[R MLO (2 of 3)]
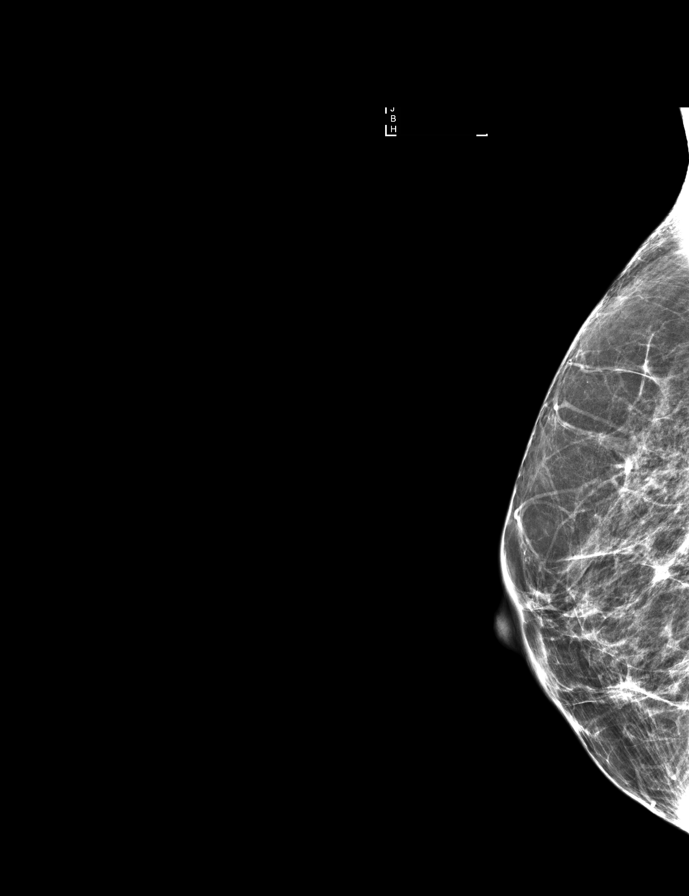

[R MLO (3 of 3)]
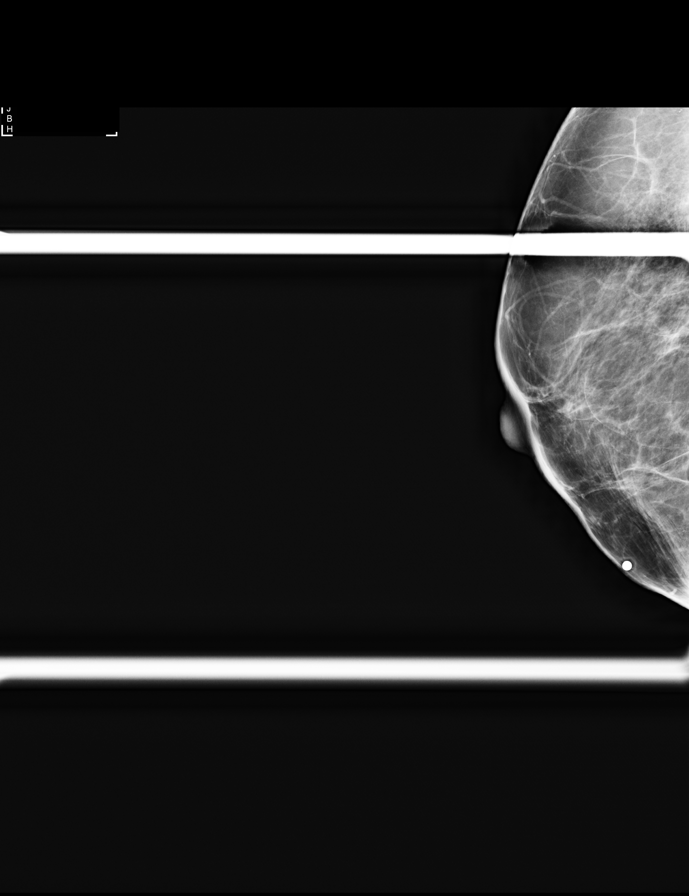

[R CC (2 of 3)]
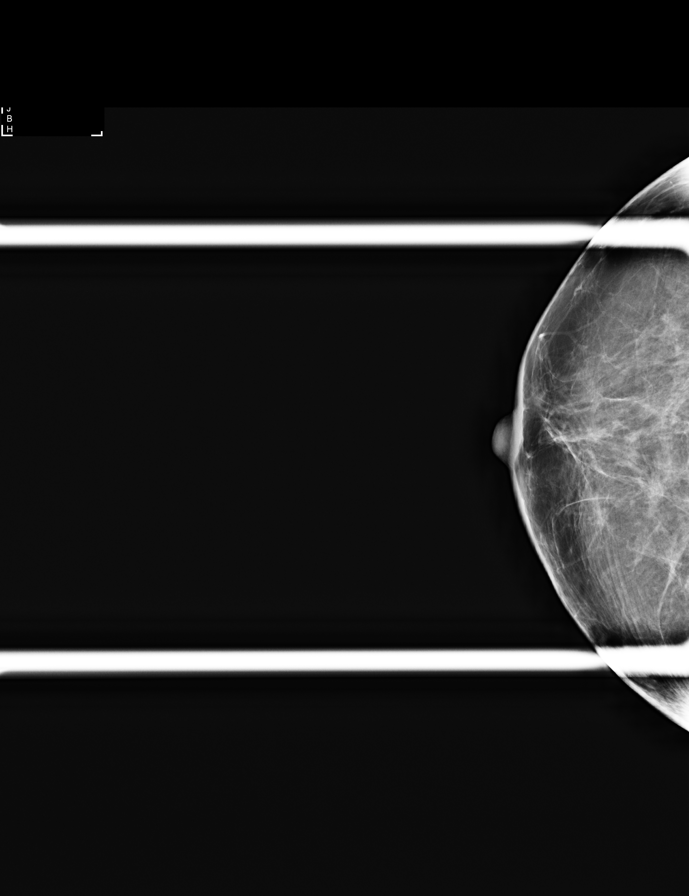

[R CC (3 of 3)]
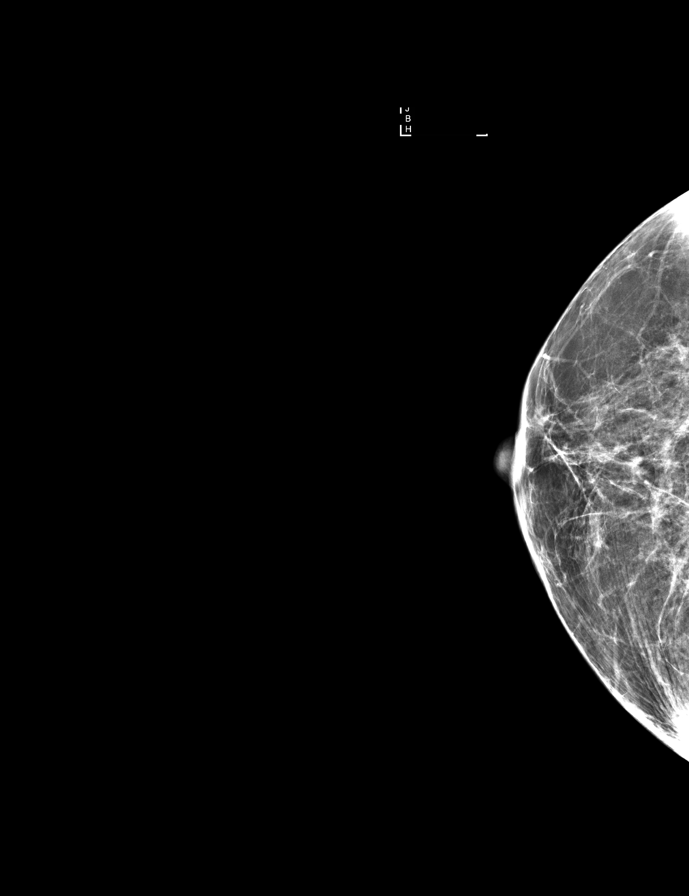

[8 of 36 positions shown; findings below may reference images not displayed]

ACR Breast Density Category b: There are scattered areas of
fibroglandular density.
FINDINGS: Additional views of the right breast demonstrate no suspicious mass,
nonsurgical distortion, or other abnormality in the area of concern
identified on screening mammogram. No abnormality is identified
underlying the palpable marker.

On physical exam, no discrete mass is felt in the area of concern
seen on screening mammogram within the central right breast. A soft,
nodular area is felt in the patient's area of concern within the
inferior right breast.

Targeted ultrasound of the patient's area of concern within the
inferior right breast demonstrates no suspicious cystic or solid
sonographic finding. The palpable area is thought to likely
represent a prominent fat lobule. No suspicious cystic or solid
sonographic finding is seen within the superior or subareolar right
breast in the area of concern seen on screening mammogram.

Mammographic images were processed with CAD.
IMPRESSION: No mammographic or sonographic evidence of malignancy.

RECOMMENDATION:
Screening mammogram in one year.(Code:QI-T-6DL)

I have discussed the findings and recommendations with the patient.
Results were also provided in writing at the conclusion of the
visit. If applicable, a reminder letter will be sent to the patient
regarding the next appointment.

BI-RADS CATEGORY  1: Negative.

## 2018-09-03 DIAGNOSIS — Z23 Encounter for immunization: Secondary | ICD-10-CM | POA: Diagnosis not present

## 2018-09-05 ENCOUNTER — Other Ambulatory Visit: Payer: Self-pay | Admitting: Internal Medicine

## 2018-10-15 ENCOUNTER — Encounter: Payer: Self-pay | Admitting: Internal Medicine

## 2018-10-15 ENCOUNTER — Ambulatory Visit (INDEPENDENT_AMBULATORY_CARE_PROVIDER_SITE_OTHER): Payer: 59 | Admitting: Internal Medicine

## 2018-10-15 VITALS — BP 122/84 | HR 87 | Temp 97.8°F | Ht 64.5 in | Wt 151.0 lb

## 2018-10-15 DIAGNOSIS — R5383 Other fatigue: Secondary | ICD-10-CM

## 2018-10-15 DIAGNOSIS — E1149 Type 2 diabetes mellitus with other diabetic neurological complication: Secondary | ICD-10-CM

## 2018-10-15 DIAGNOSIS — Z1239 Encounter for other screening for malignant neoplasm of breast: Secondary | ICD-10-CM

## 2018-10-15 DIAGNOSIS — Z0000 Encounter for general adult medical examination without abnormal findings: Secondary | ICD-10-CM

## 2018-10-15 DIAGNOSIS — I10 Essential (primary) hypertension: Secondary | ICD-10-CM | POA: Diagnosis not present

## 2018-10-15 LAB — CBC
HEMATOCRIT: 39.2 % (ref 36.0–46.0)
HEMOGLOBIN: 12.9 g/dL (ref 12.0–15.0)
MCHC: 32.8 g/dL (ref 30.0–36.0)
MCV: 82.7 fl (ref 78.0–100.0)
Platelets: 314 10*3/uL (ref 150.0–400.0)
RBC: 4.74 Mil/uL (ref 3.87–5.11)
RDW: 15.5 % (ref 11.5–15.5)
WBC: 6.3 10*3/uL (ref 4.0–10.5)

## 2018-10-15 LAB — COMPREHENSIVE METABOLIC PANEL
ALBUMIN: 4.6 g/dL (ref 3.5–5.2)
ALT: 31 U/L (ref 0–35)
AST: 22 U/L (ref 0–37)
Alkaline Phosphatase: 84 U/L (ref 39–117)
BUN: 15 mg/dL (ref 6–23)
CALCIUM: 10.4 mg/dL (ref 8.4–10.5)
CHLORIDE: 98 meq/L (ref 96–112)
CO2: 28 mEq/L (ref 19–32)
Creatinine, Ser: 1.05 mg/dL (ref 0.40–1.20)
GFR: 56.69 mL/min — ABNORMAL LOW (ref >60.00)
Glucose, Bld: 175 mg/dL — ABNORMAL HIGH (ref 70–99)
POTASSIUM: 4.6 meq/L (ref 3.5–5.1)
SODIUM: 136 meq/L (ref 135–145)
Total Bilirubin: 0.4 mg/dL (ref 0.2–1.2)
Total Protein: 7.3 g/dL (ref 6.0–8.3)

## 2018-10-15 LAB — LIPID PANEL
CHOLESTEROL: 223 mg/dL — AB (ref 0–200)
HDL: 51.8 mg/dL (ref >39.00)
NonHDL: 171.41
TRIGLYCERIDES: 339 mg/dL — AB (ref 0.0–149.0)
Total CHOL/HDL Ratio: 4
VLDL: 67.8 mg/dL — AB (ref 0.0–40.0)

## 2018-10-15 LAB — LDL CHOLESTEROL, DIRECT: Direct LDL: 140 mg/dL

## 2018-10-15 LAB — HM DIABETES FOOT EXAM

## 2018-10-15 LAB — HEMOGLOBIN A1C: Hgb A1c MFr Bld: 8.8 % — ABNORMAL HIGH (ref 4.6–6.5)

## 2018-10-15 LAB — T4, FREE: FREE T4: 1.04 ng/dL (ref 0.60–1.60)

## 2018-10-15 LAB — SEDIMENTATION RATE: Sed Rate: 12 mm/hr (ref 0–30)

## 2018-10-15 MED ORDER — ROSUVASTATIN CALCIUM 5 MG PO TABS: 5.0000 mg | ORAL_TABLET | ORAL | 3 refills | Status: DC

## 2018-10-15 NOTE — Progress Notes (Signed)
Subjective:    Patient ID: Shannon [name], female    DOB: 02-27-1956, 62 y.o.   MRN: [id number]  HPI Here for physical  Still doesn't feel back to normal since the THR Some trouble bending for a low cabinet--or in the shower Pain is better than it was Rarely takes one of the pain meds ---left over  Still feels her energy level has been drained since the surgery Has to push herself to do things Some stomach problems lately with diarrhea--no pain Not clearly from the metformin Some nausea--zofran will help No blood in stools Appetite is okay  Not checking her sugars Still with numbness in some toes--also at tip of left 2nd finger Will have brief shooting pains Had eye exam earlier this year  Current Outpatient Medications on File Prior to Visit  Medication Sig Dispense Refill  . aspirin-acetaminophen-caffeine (EXCEDRIN MIGRAINE) 250-250-65 MG tablet Take 1 tablet by mouth every 6 (six) hours as needed for headache.    . cetirizine (ZYRTEC) 10 MG tablet Take 10 mg by mouth daily.    Marland Kitchen docusate sodium (COLACE) 100 MG capsule Take 300 mg by mouth at bedtime.     Marland Kitchen glucose blood (ONE TOUCH ULTRA TEST) test strip USE ONE STRIP TO CHECK GLUCOSE ONCE DAILY AND AS NEEDED 100 each 3  . LUMIGAN 0.01 % SOLN Place 1 drop into both eyes at bedtime.     . metFORMIN (GLUCOPHAGE) 1000 MG tablet Take 1 tablet (1,000 mg total) by mouth daily with breakfast. 90 tablet 3  . metoprolol succinate (TOPROL-XL) 25 MG 24 hr tablet TAKE 1 AND 1/2 TABLETS BY  MOUTH DAILY 135 tablet 3  . ondansetron (ZOFRAN) 4 MG tablet TAKE 1 TABLET BY MOUTH THREE TIMES DAILY AS NEEDED FOR NAUSEA OR VOMITING 15 tablet 0  . ONETOUCH DELICA LANCETS FINE MISC Check blood sugar once daily and as needed. 250.00 100 each 1  . pantoprazole (PROTONIX) 40 MG tablet Take 1 tablet (40 mg total) by mouth 2 (two) times daily. On an empty stomach 180 tablet 3  . traZODone (DESYREL) 100 MG tablet TAKE 2 TABLETS BY MOUTH  EVERY NIGHT AT  BEDTIME 180 tablet 3  . triamterene-hydrochlorothiazide (MAXZIDE-25) 37.5-25 MG tablet TAKE 1 TABLET BY MOUTH ONCE A DAY 90 tablet 3  . valACYclovir (VALTREX) 1000 MG tablet TAKE 2 TABLETS BY MOUTH  DAILY AS NEEDED (AT THE  ONSET OF SYMPTOMS AND TAKE  2 TABS 12 HOURS LATER) (Patient taking differently: TAKE 2 TABLETS BY MOUTH  DAILY AS NEEDED (AT THE  ONSET OF SYMPTOMS AND TAKE  2 TABS 12 HOURS LATER) FOR FEVER BLISTERS.) 90 tablet 0  . sucralfate (CARAFATE) 1 g tablet Take 1 tablet (1 g total) by mouth 4 (four) times daily -  with meals and at bedtime. (Patient not taking: Reported on 10/15/2018) 90 tablet 3   No current facility-administered medications on file prior to visit.     Allergies  Allergen Reactions  . Lisinopril Cough  . Nitrofurantoin Other (See Comments)    Pt unsure  . Oxycodone-Acetaminophen Itching  . Atorvastatin Diarrhea    May have caused colitis--but not clear cut  . Clarithromycin Palpitations  . Glipizide Other (See Comments)    Sugars too variable    Past Medical History:  Diagnosis Date  . Allergy   . Asthma   . Depression   . Diabetes mellitus 11/12  . Family history of adverse reaction to anesthesia    PROBLEMS WITH BLOCKS  .  GERD (gastroesophageal reflux disease)   . Glaucoma suspect   . Headache   . Hyperlipidemia   . Hypertension   . Mitral valve prolapse   . Osteopenia   . Shingles 11/2006  . Sleep disturbance   . Tachycardia     Past Surgical History:  Procedure Laterality Date  . ABDOMINAL HYSTERECTOMY  2000   /BSO - abn. Paps  . APPENDECTOMY  1978  . AUGMENTATION MAMMAPLASTY Bilateral 1981  . BLADDER REPAIR     x2  . BREAST ENHANCEMENT SURGERY  1987  . [place] Left 12/18/2016   Procedure: Left Heart Cath and Coronary Angiography;  Surgeon: Wellington Hampshire, MD;  Location: [place] [facility];  Service: Cardiovascular;  Laterality: Left;  . CARPAL TUNNEL RELEASE Right   .  CERVICAL DISCECTOMY  11/2009   with synthetic discs inserted--  Dr Arnoldo Morale  . NM MYOVIEW LTD  03/2005   EF 56%, no ischemia  . RECTAL PROLAPSE REPAIR     x1  . SHOULDER SURGERY  09/2008, 01/2009   Right shoulder surgery, then redone with lysis of adhesions  . TOTAL HIP ARTHROPLASTY Right 05/29/2018   Procedure: TOTAL HIP ARTHROPLASTY ANTERIOR APPROACH;  Surgeon: Lovell Sheehan, MD;  Location: [facility];  Service: Orthopedics;  Laterality: Right;    Family History  Problem Relation Age of Onset  . Diabetes Mother   . Heart disease Mother   . Hypertension Mother   . Ovarian cancer Mother   . Melanoma Mother   . Cancer Father   . Coronary artery disease Father   . Heart disease Father   . Diabetes Sister   . Depression Sister   . Coronary artery disease Sister   . Heart disease Sister   . Rheum arthritis Maternal Aunt   . Breast cancer Maternal Grandmother   . Diabetes Maternal Grandmother   . Heart attack Paternal Uncle   . Heart attack Cousin   . Heart attack Paternal Uncle   . Diabetes Maternal Grandfather   . Colon cancer Neg Hx     Social History   Socioeconomic History  . Marital status: Married    Spouse name: Not on file  . Number of children: 5  . Years of education: Not on file  . Highest education level: Not on file  Occupational History  . Occupation: homemaker  Social Needs  . Financial resource strain: Not on file  . Food insecurity:    Worry: Not on file    Inability: Not on file  . Transportation needs:    Medical: Not on file    Non-medical: Not on file  Tobacco Use  . Smoking status: Never Smoker  . Smokeless tobacco: Never Used  Substance and Sexual Activity  . Alcohol use: No  . Drug use: No  . Sexual activity: Not on file  Lifestyle  . Physical activity:    Days per week: Not on file    Minutes per session: Not on file  . Stress: Not on file  Relationships  . Social connections:    Talks on phone: Not on file    Gets together: Not  on file    Attends religious service: Not on file    Active member of club or organization: Not on file    Attends meetings of clubs or organizations: Not on file    Relationship status: Not on file  . Intimate partner violence:  Fear of current or ex partner: Not on file    Emotionally abused: Not on file    Physically abused: Not on file    Forced sexual activity: Not on file  Other Topics Concern  . Not on file  Social History Narrative   Home maker   Doesn't exercise      Shannon [name] sister          Review of Systems  Constitutional: Positive for fatigue. Negative for unexpected weight change.       Wears seat belt  HENT: Positive for hearing loss. Negative for dental problem, tinnitus and trouble swallowing.        Working on crown now  Eyes: Negative for visual disturbance.       No diplopia or unilateral vision loss Now needs glasses all the time  Respiratory: Negative for cough, chest tightness and shortness of breath.   Cardiovascular: Positive for palpitations. Negative for chest pain and leg swelling.  Gastrointestinal: Positive for diarrhea. Negative for abdominal pain and blood in stool.       No heartburn  Endocrine: Negative for polydipsia and polyuria.  Genitourinary: Negative for difficulty urinating, dysuria and hematuria.  Musculoskeletal: Positive for arthralgias. Negative for joint swelling.       Right greater than left low back pain Did have 1 injection  Skin: Negative for rash.       No suspicious lesions  Allergic/Immunologic: Positive for environmental allergies. Negative for immunocompromised state.       Zyrtec and flonase daily  Neurological: Positive for dizziness and headaches. Negative for syncope and light-headedness.  Hematological: Negative for adenopathy. Does not bruise/bleed easily.  Psychiatric/Behavioral: Positive for sleep disturbance. Negative for dysphoric mood. The patient is not nervous/anxious.        Trouble winding  down and initiating sleep--but sleeps okay after intiating       Objective:   Physical Exam  Constitutional: She appears well-developed. No distress.  HENT:  Head: Normocephalic and atraumatic.  Right Ear: External ear normal.  Left Ear: External ear normal.  Mouth/Throat: Oropharynx is clear and moist. No oropharyngeal exudate.  Eyes: Pupils are equal, round, and reactive to light. Conjunctivae are normal.  Neck: No thyromegaly present.  Cardiovascular: Normal rate, regular rhythm, normal heart sounds and intact distal pulses. Exam reveals no gallop.  No murmur heard. Respiratory: Effort normal and breath sounds normal. No respiratory distress. She has no wheezes. She has no rales.  GI: Soft. There is no tenderness.  Musculoskeletal: She exhibits no edema or tenderness.  Lymphadenopathy:    She has no cervical adenopathy.  Neurological:  Fairly normal sensation in feet  Skin: No rash noted. No erythema.  No foot lesions  Psychiatric: She has a normal mood and affect. Her behavior is normal.           Assessment & Plan:

## 2018-10-15 NOTE — Assessment & Plan Note (Signed)
BP Readings from Last 3 Encounters:  10/15/18 122/84  06/19/18 120/70  06/03/18 (!) 141/80   Good control

## 2018-10-15 NOTE — Assessment & Plan Note (Signed)
Healthy Will set up mammogram Yearly flu vaccine Colon due 2021 No pap due to hyster Discussed exercise

## 2018-10-15 NOTE — Assessment & Plan Note (Signed)
Non specific Discussed exercise Will check blood work

## 2018-10-15 NOTE — Assessment & Plan Note (Signed)
Hasn't been checking  Hopefully still acceptable Early neuropathy--no Rx needed Will try statin twice a week

## 2018-10-16 ENCOUNTER — Other Ambulatory Visit: Payer: Self-pay | Admitting: Internal Medicine

## 2018-10-16 DIAGNOSIS — E1149 Type 2 diabetes mellitus with other diabetic neurological complication: Secondary | ICD-10-CM

## 2018-10-16 MED ORDER — CANAGLIFLOZIN 100 MG PO TABS: 100.0000 mg | ORAL_TABLET | Freq: Every day | ORAL | 11 refills | Status: DC

## 2018-10-16 NOTE — Progress Notes (Signed)
in

## 2018-10-21 NOTE — Telephone Encounter (Signed)
Pt called in and pt took Ransom today at 11 AM prior to breakfast and pt was not sure if should take metformin at breakfast or later in the day. Pt has not taken a metformin so far today. Pt said BS now 258; pt last ate at `11:30 AM Pt request cb when should take metformin since starting the [facility].  FBS this AM was 168. I explained instructions on med list for both meds but pt request note to Dr Silvio Pate to verify and pt will schedule 3 mth lab when gets cb. Pt prefers cb instead of mychart note.

## 2018-10-21 NOTE — Telephone Encounter (Signed)
Yes---she needs to continue the metformin as well

## 2018-10-21 NOTE — Telephone Encounter (Signed)
Spoke to pt

## 2018-10-23 MED ORDER — GLUCOSE BLOOD VI STRP: ORAL_STRIP | 3 refills | Status: DC

## 2018-10-23 MED ORDER — GLUCOSE BLOOD VI STRP: ORAL_STRIP | 0 refills | Status: DC

## 2018-10-23 MED ORDER — CANAGLIFLOZIN 100 MG PO TABS: 100.0000 mg | ORAL_TABLET | Freq: Every day | ORAL | 3 refills | Status: DC

## 2018-10-23 NOTE — Telephone Encounter (Signed)
30 days sent to [facility]. 90 days sent to [facility]

## 2018-10-23 NOTE — Telephone Encounter (Signed)
Patient said she started the Invokana 100 mg and would like a 3 mth rx sent to [facility].  Patient said she needs to check her sugar 2 times a day and needs a rx for One Touch Ultra test strips sent to [facility] for a 1 month supply and 3 month supply sent to [facility].

## 2018-11-26 DIAGNOSIS — H401131 Primary open-angle glaucoma, bilateral, mild stage: Secondary | ICD-10-CM | POA: Diagnosis not present

## 2018-12-03 DIAGNOSIS — H401131 Primary open-angle glaucoma, bilateral, mild stage: Secondary | ICD-10-CM | POA: Diagnosis not present

## 2018-12-03 LAB — HM DIABETES EYE EXAM

## 2018-12-12 ENCOUNTER — Encounter: Payer: Self-pay | Admitting: Internal Medicine

## 2018-12-17 ENCOUNTER — Ambulatory Visit (INDEPENDENT_AMBULATORY_CARE_PROVIDER_SITE_OTHER): Payer: 59

## 2018-12-17 DIAGNOSIS — Z23 Encounter for immunization: Secondary | ICD-10-CM | POA: Diagnosis not present

## 2018-12-29 ENCOUNTER — Other Ambulatory Visit: Payer: Self-pay | Admitting: Internal Medicine

## 2019-01-22 ENCOUNTER — Other Ambulatory Visit (INDEPENDENT_AMBULATORY_CARE_PROVIDER_SITE_OTHER): Payer: 59

## 2019-01-22 DIAGNOSIS — E1149 Type 2 diabetes mellitus with other diabetic neurological complication: Secondary | ICD-10-CM | POA: Diagnosis not present

## 2019-01-22 LAB — HEMOGLOBIN A1C: Hgb A1c MFr Bld: 8.9 % — ABNORMAL HIGH (ref 4.6–6.5)

## 2019-01-29 ENCOUNTER — Other Ambulatory Visit: Payer: Self-pay

## 2019-01-29 ENCOUNTER — Encounter: Payer: Self-pay | Admitting: Internal Medicine

## 2019-01-29 ENCOUNTER — Ambulatory Visit: Payer: 59 | Admitting: Internal Medicine

## 2019-01-29 ENCOUNTER — Telehealth: Payer: Self-pay | Admitting: Internal Medicine

## 2019-01-29 VITALS — BP 110/78 | HR 81 | Temp 98.2°F | Ht 65.0 in | Wt 149.0 lb

## 2019-01-29 DIAGNOSIS — E1149 Type 2 diabetes mellitus with other diabetic neurological complication: Secondary | ICD-10-CM | POA: Diagnosis not present

## 2019-01-29 MED ORDER — SAXAGLIPTIN HCL 2.5 MG PO TABS: 2.5000 mg | ORAL_TABLET | Freq: Every day | ORAL | 3 refills | Status: DC

## 2019-01-29 NOTE — Patient Instructions (Signed)
Please stop the invokana and try the saxagliptin. Let me know if you have any problems with this. If you do okay, but your sugars stay up (over 150 fasting, I will increase the dose to 5mg  for the next refill)

## 2019-01-29 NOTE — Progress Notes (Signed)
Subjective:    Patient ID: Shannon [name], female    DOB: Jun 23, 1956, 63 y.o.   MRN: [id number]  HPI Here due to continued problems with diabetic control  Has noted constipation and nausea since starting invokana Headaches also seem to be worse  Checking sugars--- as high as 160-180 in AM Has had a couple of low sugars---later in day if late eating----fairly minor  Current Outpatient Medications on File Prior to Visit  Medication Sig Dispense Refill  . aspirin-acetaminophen-caffeine (EXCEDRIN MIGRAINE) 250-250-65 MG tablet Take 1 tablet by mouth every 6 (six) hours as needed for headache.    . canagliflozin (INVOKANA) 100 MG TABS tablet Take 1 tablet (100 mg total) by mouth daily before breakfast. 90 tablet 3  . cetirizine (ZYRTEC) 10 MG tablet Take 10 mg by mouth daily.    Marland Kitchen docusate sodium (COLACE) 100 MG capsule Take 300 mg by mouth at bedtime.     Marland Kitchen glucose blood (ONE TOUCH ULTRA TEST) test strip Use to check blood sugar 2 times daily 200 each 3  . LUMIGAN 0.01 % SOLN Place 1 drop into both eyes at bedtime.     . metFORMIN (GLUCOPHAGE) 1000 MG tablet TAKE 1 TABLET BY MOUTH  DAILY WITH BREAKFAST 90 tablet 3  . metoprolol succinate (TOPROL-XL) 25 MG 24 hr tablet TAKE 1 AND 1/2 TABLETS BY  MOUTH DAILY 135 tablet 3  . ondansetron (ZOFRAN) 4 MG tablet TAKE 1 TABLET BY MOUTH THREE TIMES DAILY AS NEEDED FOR NAUSEA OR VOMITING 15 tablet 0  . ONETOUCH DELICA LANCETS FINE MISC Check blood sugar once daily and as needed. 250.00 100 each 1  . pantoprazole (PROTONIX) 40 MG tablet Take 1 tablet (40 mg total) by mouth 2 (two) times daily. On an empty stomach 180 tablet 3  . rosuvastatin (CRESTOR) 5 MG tablet Take 1 tablet (5 mg total) by mouth 2 (two) times a week. 26 tablet 3  . traZODone (DESYREL) 100 MG tablet TAKE 2 TABLETS BY MOUTH  EVERY NIGHT AT BEDTIME 180 tablet 3  . triamterene-hydrochlorothiazide (MAXZIDE-25) 37.5-25 MG tablet TAKE 1 TABLET BY MOUTH ONCE A DAY 90 tablet 3  .  valACYclovir (VALTREX) 1000 MG tablet TAKE 2 TABLETS BY MOUTH  DAILY AS NEEDED (AT THE  ONSET OF SYMPTOMS AND TAKE  2 TABS 12 HOURS LATER) FOR FEVER BLISTERS. 90 tablet 0  . sucralfate (CARAFATE) 1 g tablet Take 1 tablet (1 g total) by mouth 4 (four) times daily -  with meals and at bedtime. (Patient not taking: Reported on 01/29/2019) 90 tablet 3   No current facility-administered medications on file prior to visit.     Allergies  Allergen Reactions  . Lisinopril Cough  . Nitrofurantoin Other (See Comments)    Pt unsure  . Oxycodone-Acetaminophen Itching  . Atorvastatin Diarrhea    May have caused colitis--but not clear cut  . Clarithromycin Palpitations  . Glipizide Other (See Comments)    Sugars too variable    Past Medical History:  Diagnosis Date  . Allergy   . Asthma   . Depression   . Diabetes mellitus 11/12  . Family history of adverse reaction to anesthesia    PROBLEMS WITH BLOCKS  . GERD (gastroesophageal reflux disease)   . Glaucoma suspect   . Headache   . Hyperlipidemia   . Hypertension   . Mitral valve prolapse   . Osteopenia   . Shingles 11/2006  . Sleep disturbance   . Tachycardia  Past Surgical History:  Procedure Laterality Date  . ABDOMINAL HYSTERECTOMY  2000   /BSO - abn. Paps  . APPENDECTOMY  1978  . AUGMENTATION MAMMAPLASTY Bilateral 1981  . BLADDER REPAIR     x2  . BREAST ENHANCEMENT SURGERY  1987  . [place] Left 12/18/2016   Procedure: Left Heart Cath and Coronary Angiography;  Surgeon: Wellington Hampshire, MD;  Location: [place] [facility];  Service: Cardiovascular;  Laterality: Left;  . CARPAL TUNNEL RELEASE Right   . CERVICAL DISCECTOMY  11/2009   with synthetic discs inserted--  Dr Arnoldo Morale  . NM MYOVIEW LTD  03/2005   EF 56%, no ischemia  . RECTAL PROLAPSE REPAIR     x1  . SHOULDER SURGERY  09/2008, 01/2009   Right shoulder surgery, then redone with lysis of adhesions  . TOTAL  HIP ARTHROPLASTY Right 05/29/2018   Procedure: TOTAL HIP ARTHROPLASTY ANTERIOR APPROACH;  Surgeon: Lovell Sheehan, MD;  Location: [facility];  Service: Orthopedics;  Laterality: Right;    Family History  Problem Relation Age of Onset  . Diabetes Mother   . Heart disease Mother   . Hypertension Mother   . Ovarian cancer Mother   . Melanoma Mother   . Cancer Father   . Coronary artery disease Father   . Heart disease Father   . Diabetes Sister   . Depression Sister   . Coronary artery disease Sister   . Heart disease Sister   . Rheum arthritis Maternal Aunt   . Breast cancer Maternal Grandmother   . Diabetes Maternal Grandmother   . Heart attack Paternal Uncle   . Heart attack Cousin   . Heart attack Paternal Uncle   . Diabetes Maternal Grandfather   . Colon cancer Neg Hx     Social History   Socioeconomic History  . Marital status: Married    Spouse name: Not on file  . Number of children: 5  . Years of education: Not on file  . Highest education level: Not on file  Occupational History  . Occupation: homemaker  Social Needs  . Financial resource strain: Not on file  . Food insecurity:    Worry: Not on file    Inability: Not on file  . Transportation needs:    Medical: Not on file    Non-medical: Not on file  Tobacco Use  . Smoking status: Never Smoker  . Smokeless tobacco: Never Used  Substance and Sexual Activity  . Alcohol use: No  . Drug use: No  . Sexual activity: Not on file  Lifestyle  . Physical activity:    Days per week: Not on file    Minutes per session: Not on file  . Stress: Not on file  Relationships  . Social connections:    Talks on phone: Not on file    Gets together: Not on file    Attends religious service: Not on file    Active member of club or organization: Not on file    Attends meetings of clubs or organizations: Not on file    Relationship status: Not on file  . Intimate partner violence:    Fear of current or ex partner: Not  on file    Emotionally abused: Not on file    Physically abused: Not on file    Forced sexual activity: Not on file  Other Topics Concern  . Not on file  Social History Narrative  Home maker   Doesn't exercise      [name] sister         Review of Systems  Sleeping okay Appetite is fine Weight is stable     Objective:   Physical Exam  Constitutional: No distress.  Psychiatric: She has a normal mood and affect. Her behavior is normal.           Assessment & Plan:

## 2019-01-29 NOTE — Assessment & Plan Note (Signed)
Intolerant of the invokana Will try saxagliptin instead----if doesn't tolerate, will go back to lantus (at very low doses) If tolerates, but sugars stay up, will increase to 5mg  in 1 month 15 minutes counseling and planning for this treatment

## 2019-01-29 NOTE — Telephone Encounter (Signed)
Pt went to pharmacy to pick up saxagliptin and stated she can't afford it. Please advise.

## 2019-01-29 NOTE — Telephone Encounter (Signed)
I looked it up online and there is an [place] discount card. I advised her to get online and try the discount card. If that is still too expensive, she will let us know and we can send in the [place]. It has a discount card, also.

## 2019-01-29 NOTE — Telephone Encounter (Signed)
Please call the pharmacist See if sitagliptin 50mg  daily is any better If not, we will need to restart generic lantus at a very low dose

## 2019-02-10 MED ORDER — SAXAGLIPTIN HCL 5 MG PO TABS: 5.0000 mg | ORAL_TABLET | Freq: Every day | ORAL | 2 refills | Status: DC

## 2019-02-17 ENCOUNTER — Telehealth: Payer: Self-pay | Admitting: Internal Medicine

## 2019-02-17 NOTE — Telephone Encounter (Signed)
Spoke to pt. She will try taking metformin twice a day and let us know if we need to send in a rx for it.

## 2019-02-17 NOTE — Telephone Encounter (Signed)
Patient said her Onglyza was changed from 2 1/2 to 5.  Patient said her sugar readings are steadily climbing. This morning it was 267 and that's before breakfast and it was 244 yesterday. Patient uses [facility]

## 2019-02-17 NOTE — Telephone Encounter (Signed)
It might take some time for it to kick in. If she thinks she can tolerate it, she can try increasing the metformin to 1000mg  bid (add dinner dose) Send new Rx if she tolerates

## 2019-02-18 ENCOUNTER — Ambulatory Visit: Payer: 59

## 2019-03-03 ENCOUNTER — Other Ambulatory Visit: Payer: Self-pay | Admitting: Internal Medicine

## 2019-03-07 MED ORDER — BASAGLAR KWIKPEN 100 UNIT/ML ~~LOC~~ SOPN: 5.0000 [IU] | PEN_INJECTOR | Freq: Every day | SUBCUTANEOUS | 3 refills | Status: DC

## 2019-03-07 MED ORDER — PEN NEEDLES 32G X 6 MM MISC: 1.0000 [IU] | Freq: Every day | 3 refills | Status: DC

## 2019-03-10 MED ORDER — ROSUVASTATIN CALCIUM 5 MG PO TABS: 5.0000 mg | ORAL_TABLET | ORAL | 3 refills | Status: DC

## 2019-03-10 MED ORDER — METFORMIN HCL 1000 MG PO TABS: 1000.0000 mg | ORAL_TABLET | Freq: Two times a day (BID) | ORAL | 3 refills | Status: DC

## 2019-03-18 ENCOUNTER — Telehealth: Payer: Self-pay | Admitting: Internal Medicine

## 2019-03-18 NOTE — Telephone Encounter (Signed)
Pending

## 2019-03-26 ENCOUNTER — Telehealth: Payer: 59 | Admitting: Internal Medicine

## 2019-04-09 ENCOUNTER — Other Ambulatory Visit: Payer: Self-pay | Admitting: Internal Medicine

## 2019-04-15 ENCOUNTER — Other Ambulatory Visit: Payer: Self-pay

## 2019-04-15 ENCOUNTER — Ambulatory Visit (INDEPENDENT_AMBULATORY_CARE_PROVIDER_SITE_OTHER): Payer: 59

## 2019-04-15 DIAGNOSIS — Z23 Encounter for immunization: Secondary | ICD-10-CM

## 2019-04-16 ENCOUNTER — Ambulatory Visit: Payer: 59 | Admitting: Internal Medicine

## 2019-05-05 ENCOUNTER — Other Ambulatory Visit: Payer: Self-pay | Admitting: Internal Medicine

## 2019-05-15 ENCOUNTER — Telehealth: Payer: Self-pay | Admitting: Internal Medicine

## 2019-05-15 NOTE — Telephone Encounter (Signed)

## 2019-05-16 ENCOUNTER — Ambulatory Visit: Payer: 59 | Admitting: Internal Medicine

## 2019-05-16 ENCOUNTER — Encounter: Payer: Self-pay | Admitting: Internal Medicine

## 2019-05-16 ENCOUNTER — Other Ambulatory Visit: Payer: Self-pay

## 2019-05-16 VITALS — BP 118/74 | HR 98 | Ht 64.0 in | Wt 151.8 lb

## 2019-05-16 DIAGNOSIS — E782 Mixed hyperlipidemia: Secondary | ICD-10-CM

## 2019-05-16 DIAGNOSIS — M79605 Pain in left leg: Secondary | ICD-10-CM | POA: Diagnosis not present

## 2019-05-16 DIAGNOSIS — R079 Chest pain, unspecified: Secondary | ICD-10-CM

## 2019-05-16 DIAGNOSIS — M79604 Pain in right leg: Secondary | ICD-10-CM

## 2019-05-16 NOTE — Patient Instructions (Signed)
Medication Instructions:  Your physician recommends that you continue on your current medications as directed. Please refer to the Current Medication list given to you today.  If you need a refill on your cardiac medications before your next appointment, please call your pharmacy.   Lab work: - None ordered.  If you have labs (blood work) drawn today and your tests are completely normal, you will receive your results only by: Marland Kitchen MyChart Message (if you have MyChart) OR . A paper copy in the mail If you have any lab test that is abnormal or we need to change your treatment, we will call you to review the results.  Testing/Procedures: - None ordered.   Follow-Up: At [facility], you and your health needs are our priority.  As part of our continuing mission to provide you with exceptional heart care, we have created designated Provider Care Teams.  These Care Teams include your primary Cardiologist (physician) and Advanced Practice Providers (APPs -  Physician Assistants and Nurse Practitioners) who all work together to provide you with the care you need, when you need it. You will need a follow up appointment in 12 months.  Please call our office 2 months in advance to schedule this appointment.  You may see DR Harrell Gave END or one of the following Advanced Practice Providers on your designated Care Team:   Murray Hodgkins, NP Christell Faith, PA-C . Marrianne Mood, PA-C

## 2019-05-16 NOTE — Progress Notes (Signed)
Follow-up Outpatient Visit Date: 05/16/2019  Primary Care Provider: Venia Carbon, MD [place] [address]  Chief Complaint: Leg pain  HPI:  Shannon [name] is a 63 y.o. year-old female with history of hypertension, hyperlipidemia, diabetes mellitus, mitral valve prolapse, and GERD, who presents for follow-up of chest pain.  She was previously followed in our office by Dr. Farrel Conners, having last been seen in 12/2016.  At that time, she continued to complain of short episodes of nonexertional chest pain, though preceding cath showed no significant CAD.  Today, Shannon [name] reports that her chest pain has improved considerably since her last visit.  She has been more active and is trying to eat better.  She notes very mild chest tightness when she is very active, which improves promptly with rest.  She denies significant shortness of breath and edema.  She has rare episodes of brief palpitations without associated symptoms.  Her main complaint today is of bilateral thigh and calf pain, most often at the Shannon [name] of the day or at night.  She does not have pain or cramping in her legs with activity.  The discomfort seems to have developed after her right hip replacement last year.  --------------------------------------------------------------------------------------------------  Past Medical History:  Diagnosis Date   Allergy    Asthma    Depression    Diabetes mellitus 11/12   Family history of adverse reaction to anesthesia    PROBLEMS WITH BLOCKS   GERD (gastroesophageal reflux disease)    Glaucoma suspect    Headache    Hyperlipidemia    Hypertension    Mitral valve prolapse    Osteopenia    Shingles 11/2006   Sleep disturbance    Tachycardia    Past Surgical History:  Procedure Laterality Date   ABDOMINAL HYSTERECTOMY  2000   /BSO - abn. Paps   APPENDECTOMY  1978   AUGMENTATION MAMMAPLASTY Bilateral 1981   BLADDER REPAIR     x2   BREAST  ENHANCEMENT SURGERY  1987   CARDIAC CATHETERIZATION     [facility]   CARDIAC CATHETERIZATION Left 12/18/2016   Procedure: Left Heart Cath and Coronary Angiography;  Surgeon: Wellington Hampshire, MD;  Location: [facility];  Service: Cardiovascular;  Laterality: Left;   CARPAL TUNNEL RELEASE Right    CERVICAL DISCECTOMY  11/2009   with synthetic discs inserted--  Dr Arnoldo Morale   NM MYOVIEW LTD  03/2005   EF 56%, no ischemia   RECTAL PROLAPSE REPAIR     x1   SHOULDER SURGERY  09/2008, 01/2009   Right shoulder surgery, then redone with lysis of adhesions   TOTAL HIP ARTHROPLASTY Right 05/29/2018   Procedure: TOTAL HIP ARTHROPLASTY ANTERIOR APPROACH;  Surgeon: Lovell Sheehan, MD;  Location: [facility];  Service: Orthopedics;  Laterality: Right;    Current Meds  Medication Sig   aspirin-acetaminophen-caffeine (EXCEDRIN MIGRAINE) 250-250-65 MG tablet Take 1 tablet by mouth every 6 (six) hours as needed for headache.   cetirizine (ZYRTEC) 10 MG tablet Take 10 mg by mouth daily.   docusate sodium (COLACE) 100 MG capsule Take 300 mg by mouth at bedtime.    glucose blood (ONE TOUCH ULTRA TEST) test strip Use to check blood sugar 2 times daily   Insulin Glargine (BASAGLAR KWIKPEN) 100 UNIT/ML SOPN Inject 0.05-0.2 mLs (5-20 Units total) into the skin at bedtime.   Insulin Pen Needle (PEN NEEDLES) 32G X 6 MM MISC 1 Units by Does not apply route daily.  LUMIGAN 0.01 % SOLN Place 1 drop into both eyes at bedtime.    metFORMIN (GLUCOPHAGE) 1000 MG tablet Take 1 tablet (1,000 mg total) by mouth 2 (two) times daily with a meal.   metoprolol succinate (TOPROL-XL) 25 MG 24 hr tablet TAKE 1 AND 1/2 TABLETS BY  MOUTH DAILY   ondansetron (ZOFRAN) 4 MG tablet TAKE 1 TABLET BY MOUTH THREE TIMES DAILY AS NEEDED FOR NAUSEA AND VOMITING   ONETOUCH DELICA LANCETS FINE MISC Check blood sugar once daily and as needed. 250.00   ONGLYZA 5 MG TABS tablet Take 1 tablet by mouth once daily   pantoprazole  (PROTONIX) 40 MG tablet TAKE 1 TABLET BY MOUTH 2  TIMES DAILY ON AN EMPTY  STOMACH   rosuvastatin (CRESTOR) 5 MG tablet Take 1 tablet (5 mg total) by mouth 3 (three) times a week.   traZODone (DESYREL) 100 MG tablet TAKE 2 TABLETS BY MOUTH  EVERY NIGHT AT BEDTIME   triamterene-hydrochlorothiazide (MAXZIDE-25) 37.5-25 MG tablet TAKE 1 TABLET BY MOUTH ONCE A DAY   valACYclovir (VALTREX) 1000 MG tablet TAKE 2 TABLETS BY MOUTH  DAILY AS NEEDED (AT THE  ONSET OF SYMPTOMS AND TAKE  2 TABS 12 HOURS LATER) FOR FEVER BLISTERS.    Allergies: Lisinopril, Nitrofurantoin, Oxycodone-acetaminophen, Atorvastatin, Clarithromycin, Glipizide, and Invokana [canagliflozin]  Social History   Tobacco Use   Smoking status: Never Smoker   Smokeless tobacco: Never Used  Substance Use Topics   Alcohol use: No   Drug use: No    Family History  Problem Relation Age of Onset   Diabetes Mother    Heart disease Mother    Hypertension Mother    Ovarian cancer Mother    Melanoma Mother    Cancer Father    Coronary artery disease Father    Heart disease Father    Diabetes Sister    Depression Sister    Coronary artery disease Sister    Heart disease Sister    Rheum arthritis Maternal Aunt    Breast cancer Maternal Grandmother    Diabetes Maternal Grandmother    Heart attack Paternal Uncle    Heart attack Cousin    Heart attack Paternal Uncle    Diabetes Maternal Grandfather    Colon cancer Neg Hx     Review of Systems: A 12-system review of systems was performed and was negative except as noted in the HPI.  --------------------------------------------------------------------------------------------------  Physical Exam: BP 118/74 (BP Location: Left Arm, Patient Position: Sitting, Cuff Size: Normal)    Pulse 98    Ht 5\' 4"  (1.626 m)    Wt 151 lb 12 oz (68.8 kg)    BMI 26.05 kg/m   General:  NAD HEENT: No conjunctival pallor or scleral icterus. Face mask in place Neck:  Supple without lymphadenopathy, thyromegaly, JVD, or HJR. No carotid bruit. Lungs: Normal work of breathing. Clear to auscultation bilaterally without wheezes or crackles. Heart: Regular rate and rhythm without murmurs, rubs, or gallops. Non-displaced PMI. Abd: Bowel sounds present. Soft, NT/ND without hepatosplenomegaly Ext: No lower extremity edema. Radial, PT, and DP pulses are 2+ bilaterally. Skin: Warm and dry without rash.  EKG:  Normal sinus rhythm without abnormality.  Lab Results  Component Value Date   WBC 6.3 10/15/2018   HGB 12.9 10/15/2018   HCT 39.2 10/15/2018   MCV 82.7 10/15/2018   PLT 314.0 10/15/2018    Lab Results  Component Value Date   NA 136 10/15/2018   K 4.6 10/15/2018   CL  98 10/15/2018   CO2 28 10/15/2018   BUN 15 10/15/2018   CREATININE 1.05 10/15/2018   GLUCOSE 175 (H) 10/15/2018   ALT 31 10/15/2018    Lab Results  Component Value Date   CHOL 223 (H) 10/15/2018   HDL 51.80 10/15/2018   LDLCALC 126 (H) 12/14/2016   LDLDIRECT 140.0 10/15/2018   TRIG 339.0 (H) 10/15/2018   CHOLHDL 4 10/15/2018    --------------------------------------------------------------------------------------------------  ASSESSMENT AND PLAN: Leg pain: Timing of pain is not consistent with claudication.  Pedal pulses are also normal on exam today.  We discussed obtaining ABI's but have agreed to defer this given low suspicion for PAD.  Chest pain: Improved since her last visit.  Given normal stress test and minimal CAD on catheterization in 2018, no further testing or intervention is recommended at this time.  Hyperlipidemia: Most recent lipid panel in 09/2018 was notable for LDL of 140.  Given history of DM, I recommend escalation of statin therapy for target LDL of <100.  I will defer ongoing management to Dr. Silvio Pate.  Follow-up: Return to clinic in 1 year.  Nelva Bush, MD 05/16/2019 3:18 PM

## 2019-05-17 DIAGNOSIS — M79605 Pain in left leg: Secondary | ICD-10-CM | POA: Insufficient documentation

## 2019-05-17 DIAGNOSIS — M79604 Pain in right leg: Secondary | ICD-10-CM | POA: Insufficient documentation

## 2019-05-26 ENCOUNTER — Ambulatory Visit (INDEPENDENT_AMBULATORY_CARE_PROVIDER_SITE_OTHER): Payer: 59 | Admitting: Internal Medicine

## 2019-05-26 ENCOUNTER — Encounter: Payer: Self-pay | Admitting: Internal Medicine

## 2019-05-26 DIAGNOSIS — J988 Other specified respiratory disorders: Secondary | ICD-10-CM | POA: Diagnosis not present

## 2019-05-26 NOTE — Progress Notes (Signed)
Subjective:    Patient ID: Shannon [name], female    DOB: 07/06/1956, 63 y.o.   MRN: [id number]  HPI Virtual visit for several issues---mostly ongoing congestion Identification done Reviewed billing and she gave consent She is in her home--- and I am in my office  Has been feeling bad for several weeks---continuing to get worse Very stuffy nose Feels weak Now for 3-4 days, if she goes out to garden or other typical work--will get SOB and weak Having a lot of headaches Some dry cough--very little No sig SOB at rest No fever---but has been up slightly from her normal  Sugars are higher now also--associated with the illness Still has had some low sugars at times Occasionally 170-200 fasting Now on 6-8 units of the insulin at bedtime  Current Outpatient Medications on File Prior to Visit  Medication Sig Dispense Refill  . aspirin-acetaminophen-caffeine (EXCEDRIN MIGRAINE) 250-250-65 MG tablet Take 1 tablet by mouth every 6 (six) hours as needed for headache.    . cetirizine (ZYRTEC) 10 MG tablet Take 10 mg by mouth daily.    Marland Kitchen docusate sodium (COLACE) 100 MG capsule Take 300 mg by mouth at bedtime.     Marland Kitchen glucose blood (ONE TOUCH ULTRA TEST) test strip Use to check blood sugar 2 times daily 200 each 3  . Insulin Glargine (BASAGLAR KWIKPEN) 100 UNIT/ML SOPN Inject 0.05-0.2 mLs (5-20 Units total) into the skin at bedtime. 6 mL 3  . Insulin Pen Needle (PEN NEEDLES) 32G X 6 MM MISC 1 Units by Does not apply route daily. 100 each 3  . LUMIGAN 0.01 % SOLN Place 1 drop into both eyes at bedtime.     . metFORMIN (GLUCOPHAGE) 1000 MG tablet Take 1 tablet (1,000 mg total) by mouth 2 (two) times daily with a meal. 180 tablet 3  . metoprolol succinate (TOPROL-XL) 25 MG 24 hr tablet TAKE 1 AND 1/2 TABLETS BY  MOUTH DAILY 135 tablet 3  . ondansetron (ZOFRAN) 4 MG tablet TAKE 1 TABLET BY MOUTH THREE TIMES DAILY AS NEEDED FOR NAUSEA AND VOMITING 60 tablet 0  . ONETOUCH DELICA LANCETS FINE MISC  Check blood sugar once daily and as needed. 250.00 100 each 1  . ONGLYZA 5 MG TABS tablet Take 1 tablet by mouth once daily 30 tablet 0  . pantoprazole (PROTONIX) 40 MG tablet TAKE 1 TABLET BY MOUTH 2  TIMES DAILY ON AN EMPTY  STOMACH 180 tablet 3  . rosuvastatin (CRESTOR) 5 MG tablet Take 1 tablet (5 mg total) by mouth 3 (three) times a week. 36 tablet 3  . traZODone (DESYREL) 100 MG tablet TAKE 2 TABLETS BY MOUTH  EVERY NIGHT AT BEDTIME 180 tablet 3  . triamterene-hydrochlorothiazide (MAXZIDE-25) 37.5-25 MG tablet TAKE 1 TABLET BY MOUTH ONCE A DAY 90 tablet 3  . valACYclovir (VALTREX) 1000 MG tablet TAKE 2 TABLETS BY MOUTH  DAILY AS NEEDED (AT THE  ONSET OF SYMPTOMS AND TAKE  2 TABS 12 HOURS LATER) FOR FEVER BLISTERS. 90 tablet 0   No current facility-administered medications on file prior to visit.     Allergies  Allergen Reactions  . Lisinopril Cough  . Nitrofurantoin Other (See Comments)    Pt unsure  . Oxycodone-Acetaminophen Itching  . Atorvastatin Diarrhea    May have caused colitis--but not clear cut  . Clarithromycin Palpitations  . Glipizide Other (See Comments)    Sugars too variable  . Invokana [Canagliflozin]     Nausea, constipation  Past Medical History:  Diagnosis Date  . Allergy   . Asthma   . Depression   . Diabetes mellitus 11/12  . Family history of adverse reaction to anesthesia    PROBLEMS WITH BLOCKS  . GERD (gastroesophageal reflux disease)   . Glaucoma suspect   . Headache   . Hyperlipidemia   . Hypertension   . Mitral valve prolapse   . Osteopenia   . Shingles 11/2006  . Sleep disturbance   . Tachycardia     Past Surgical History:  Procedure Laterality Date  . ABDOMINAL HYSTERECTOMY  2000   /BSO - abn. Paps  . APPENDECTOMY  1978  . AUGMENTATION MAMMAPLASTY Bilateral 1981  . BLADDER REPAIR     x2  . BREAST ENHANCEMENT SURGERY  1987  . [place] Left 12/18/2016   Procedure: Left Heart  Cath and Coronary Angiography;  Surgeon: Wellington Hampshire, MD;  Location: [facility];  Service: Cardiovascular;  Laterality: Left;  . CARPAL TUNNEL RELEASE Right   . CERVICAL DISCECTOMY  11/2009   with synthetic discs inserted--  Dr Arnoldo Morale  . NM MYOVIEW LTD  03/2005   EF 56%, no ischemia  . RECTAL PROLAPSE REPAIR     x1  . SHOULDER SURGERY  09/2008, 01/2009   Right shoulder surgery, then redone with lysis of adhesions  . TOTAL HIP ARTHROPLASTY Right 05/29/2018   Procedure: TOTAL HIP ARTHROPLASTY ANTERIOR APPROACH;  Surgeon: Lovell Sheehan, MD;  Location: [facility];  Service: Orthopedics;  Laterality: Right;    Family History  Problem Relation Age of Onset  . Diabetes Mother   . Heart disease Mother   . Hypertension Mother   . Ovarian cancer Mother   . Melanoma Mother   . Cancer Father   . Coronary artery disease Father   . Heart disease Father   . Diabetes Sister   . Depression Sister   . Coronary artery disease Sister   . Heart disease Sister   . Rheum arthritis Maternal Aunt   . Breast cancer Maternal Grandmother   . Diabetes Maternal Grandmother   . Heart attack Paternal Uncle   . Heart attack Cousin   . Heart attack Paternal Uncle   . Diabetes Maternal Grandfather   . Colon cancer Neg Hx     Social History   Socioeconomic History  . Marital status: Married    Spouse name: Not on file  . Number of children: 5  . Years of education: Not on file  . Highest education level: Not on file  Occupational History  . Occupation: homemaker  Social Needs  . Financial resource strain: Not on file  . Food insecurity    Worry: Not on file    Inability: Not on file  . Transportation needs    Medical: Not on file    Non-medical: Not on file  Tobacco Use  . Smoking status: Never Smoker  . Smokeless tobacco: Never Used  Substance and Sexual Activity  . Alcohol use: No  . Drug use: No  . Sexual activity: Not on file  Lifestyle  . Physical activity    Days per  week: Not on file    Minutes per session: Not on file  . Stress: Not on file  Relationships  . Social Herbalist on phone: Not on file    Gets together: Not on file    Attends religious service: Not on  file    Active member of club or organization: Not on file    Attends meetings of clubs or organizations: Not on file    Relationship status: Not on file  . Intimate partner violence    Fear of current or ex partner: Not on file    Emotionally abused: Not on file    Physically abused: Not on file    Forced sexual activity: Not on file  Other Topics Concern  . Not on file  Social History Narrative   Home maker   Doesn't exercise      [name] sister         Review of Systems More nausea and has had some diarrhea at night for 2 days No vomiting Eating okay---appetite is off some Eating a lot of her fresh vegetables Only out to the grocery store---for her, sister and mom Son has some mild illness---walks at water treatment plant. Husband has no symptoms Has had headaches daily--excedrin migraine is the only thing that helps (discussed avoiding daily use)    Objective:   Physical Exam  Constitutional: She appears well-developed. No distress.  Respiratory: Effort normal. No respiratory distress.  Psychiatric: She has a normal mood and affect. Her behavior is normal.           Assessment & Plan:

## 2019-05-26 NOTE — Assessment & Plan Note (Signed)
Has had symptoms for about 3 weeks--worse in the last few days Atypical but certainly could be COVID--will set up testing Nothing to suggest pneumonia and not clearly sinusitis---so will hold off on empiric antibiotics May need urgent care visit for CXR if worsens though--did discuss that COVID can worsen after this long and to be vigilant for SOB If not better next week, will definitely need in person evaluation (?urgent care)

## 2019-05-27 ENCOUNTER — Other Ambulatory Visit: Payer: Self-pay | Admitting: Internal Medicine

## 2019-05-27 ENCOUNTER — Other Ambulatory Visit: Payer: 59

## 2019-05-27 ENCOUNTER — Telehealth: Payer: Self-pay | Admitting: *Deleted

## 2019-05-27 DIAGNOSIS — Z20822 Contact with and (suspected) exposure to covid-19: Secondary | ICD-10-CM

## 2019-05-27 NOTE — Telephone Encounter (Signed)
Scheduled patient for COVID 19 test today at 2:45 pm at [facility] [place].  Testing protocol reviewed with patient.

## 2019-05-27 NOTE — Telephone Encounter (Signed)
-----   Message from Venia Carbon, MD sent at 05/26/2019  2:23 PM EDT ----- Please set her up for COVID testing. Has had mild respiratory infection for 3 weeks with worsening in past few days

## 2019-05-31 LAB — NOVEL CORONAVIRUS, NAA: SARS-CoV-2, NAA: NOT DETECTED

## 2019-06-02 ENCOUNTER — Ambulatory Visit: Payer: 59 | Admitting: Internal Medicine

## 2019-06-03 ENCOUNTER — Telehealth: Payer: Self-pay | Admitting: Internal Medicine

## 2019-06-03 DIAGNOSIS — Z87898 Personal history of other specified conditions: Secondary | ICD-10-CM

## 2019-06-03 NOTE — Telephone Encounter (Signed)
Order created for Bilateral Diagnostic Mammogram.

## 2019-06-03 NOTE — Telephone Encounter (Signed)
Pt said her last mammogram was about 2017. She had a small lump then. She said the place is getting bigger and [place] requires a bilateral diagnostic mammogram now.  Pt said she is still short of breath. It has improved but not cleared. Weakness is better. Covid test was negative. Asking what she needs to do or does she need to f/u with Dr Silvio Pate.

## 2019-06-03 NOTE — Telephone Encounter (Signed)
Okay to just send in the order everytime I put it in--they tell me I have done the wrong order Confirm what needs to be done and go ahead and put the orders in

## 2019-06-03 NOTE — Telephone Encounter (Signed)
Patient stated that she recently had a mammogram done and they found a lump.  They advised her to call our office to have a diagnostic order sent over to them.     [facility] C/B # [phone]

## 2019-06-04 ENCOUNTER — Other Ambulatory Visit: Payer: Self-pay | Admitting: Internal Medicine

## 2019-06-04 DIAGNOSIS — Z87898 Personal history of other specified conditions: Secondary | ICD-10-CM

## 2019-06-07 ENCOUNTER — Other Ambulatory Visit: Payer: Self-pay | Admitting: Internal Medicine

## 2019-06-12 ENCOUNTER — Ambulatory Visit
Admission: RE | Admit: 2019-06-12 | Discharge: 2019-06-12 | Disposition: A | Discharge status: Home / Self Care | Payer: 59 | Source: Ambulatory Visit | Attending: Internal Medicine | Admitting: Internal Medicine

## 2019-06-12 ENCOUNTER — Other Ambulatory Visit: Payer: Self-pay | Admitting: Internal Medicine

## 2019-06-12 DIAGNOSIS — Z87898 Personal history of other specified conditions: Secondary | ICD-10-CM | POA: Diagnosis present

## 2019-07-04 ENCOUNTER — Encounter: Payer: Self-pay | Admitting: Internal Medicine

## 2019-07-04 ENCOUNTER — Other Ambulatory Visit: Payer: Self-pay

## 2019-07-04 ENCOUNTER — Ambulatory Visit: Payer: 59 | Admitting: Internal Medicine

## 2019-07-04 VITALS — BP 126/80 | HR 96 | Temp 98.2°F | Ht 64.0 in | Wt 149.0 lb

## 2019-07-04 DIAGNOSIS — M109 Gout, unspecified: Secondary | ICD-10-CM | POA: Diagnosis not present

## 2019-07-04 DIAGNOSIS — E1149 Type 2 diabetes mellitus with other diabetic neurological complication: Secondary | ICD-10-CM

## 2019-07-04 DIAGNOSIS — I10 Essential (primary) hypertension: Secondary | ICD-10-CM

## 2019-07-04 DIAGNOSIS — M24542 Contracture, left hand: Secondary | ICD-10-CM | POA: Insufficient documentation

## 2019-07-04 LAB — POCT GLYCOSYLATED HEMOGLOBIN (HGB A1C): Hemoglobin A1C: 7.3 % — AB (ref 4.0–5.6)

## 2019-07-04 MED ORDER — COLCHICINE 0.6 MG PO TABS: 0.6000 mg | ORAL_TABLET | Freq: Two times a day (BID) | ORAL | 3 refills | Status: DC | PRN

## 2019-07-04 NOTE — Assessment & Plan Note (Signed)
Has been working harder on lifestyle  Lab Results  Component Value Date   HGBA1C 7.3 (A) 07/04/2019   Congratulations Keep it up!

## 2019-07-04 NOTE — Progress Notes (Signed)
Subjective:    Patient ID: Shannon [name], female    DOB: Jul 03, 1956, 63 y.o.   MRN: [id number]  HPI Here for follow up of diabetes  Has really been working on diabetes Better fitness Monitoring sugars more--fastings 142-209 Same sensory changes  Had redness, swelling, heat in right 1st MTP Pain started radiating up her foot Tried some cherry capsules Used her mom's colchicine  Left middle finger is contracted Trouble extending  Current Outpatient Medications on File Prior to Visit  Medication Sig Dispense Refill  . aspirin-acetaminophen-caffeine (EXCEDRIN MIGRAINE) 250-250-65 MG tablet Take 1 tablet by mouth every 6 (six) hours as needed for headache.    . cetirizine (ZYRTEC) 10 MG tablet Take 10 mg by mouth daily.    Marland Kitchen docusate sodium (COLACE) 100 MG capsule Take 300 mg by mouth at bedtime.     Marland Kitchen glucose blood (ONE TOUCH ULTRA TEST) test strip Use to check blood sugar 2 times daily 200 each 3  . Insulin Glargine (BASAGLAR KWIKPEN) 100 UNIT/ML SOPN Inject 0.05-0.2 mLs (5-20 Units total) into the skin at bedtime. 6 mL 3  . Insulin Pen Needle (PEN NEEDLES) 32G X 6 MM MISC 1 Units by Does not apply route daily. 100 each 3  . LUMIGAN 0.01 % SOLN Place 1 drop into both eyes at bedtime.     . metFORMIN (GLUCOPHAGE) 1000 MG tablet Take 1 tablet (1,000 mg total) by mouth 2 (two) times daily with a meal. 180 tablet 3  . metoprolol succinate (TOPROL-XL) 25 MG 24 hr tablet TAKE 1 AND 1/2 TABLETS BY  MOUTH DAILY 135 tablet 3  . ondansetron (ZOFRAN) 4 MG tablet TAKE 1 TABLET BY MOUTH THREE TIMES DAILY AS NEEDED FOR NAUSEA AND VOMITING 60 tablet 0  . ONETOUCH DELICA LANCETS FINE MISC Check blood sugar once daily and as needed. 250.00 100 each 1  . ONGLYZA 5 MG TABS tablet Take 1 tablet by mouth once daily 30 tablet 11  . pantoprazole (PROTONIX) 40 MG tablet TAKE 1 TABLET BY MOUTH 2  TIMES DAILY ON AN EMPTY  STOMACH 180 tablet 3  . rosuvastatin (CRESTOR) 5 MG tablet Take 1 tablet (5 mg total)  by mouth 3 (three) times a week. 36 tablet 3  . traZODone (DESYREL) 100 MG tablet TAKE 2 TABLETS BY MOUTH  EVERY NIGHT AT BEDTIME 180 tablet 3  . triamterene-hydrochlorothiazide (MAXZIDE-25) 37.5-25 MG tablet TAKE 1 TABLET BY MOUTH ONCE A DAY 90 tablet 3  . valACYclovir (VALTREX) 1000 MG tablet TAKE 2 TABLETS BY MOUTH  DAILY AS NEEDED (AT THE  ONSET OF SYMPTOMS AND TAKE  2 TABS 12 HOURS LATER) FOR FEVER BLISTERS. 90 tablet 0   No current facility-administered medications on file prior to visit.     Allergies  Allergen Reactions  . Lisinopril Cough  . Nitrofurantoin Other (See Comments)    Pt unsure  . Oxycodone-Acetaminophen Itching  . Atorvastatin Diarrhea    May have caused colitis--but not clear cut  . Clarithromycin Palpitations  . Glipizide Other (See Comments)    Sugars too variable  . Invokana [Canagliflozin]     Nausea, constipation    Past Medical History:  Diagnosis Date  . Allergy   . Asthma   . Depression   . Diabetes mellitus 11/12  . Family history of adverse reaction to anesthesia    PROBLEMS WITH BLOCKS  . GERD (gastroesophageal reflux disease)   . Glaucoma suspect   . Headache   . Hyperlipidemia   .  Hypertension   . Mitral valve prolapse   . Osteopenia   . Shingles 11/2006  . Sleep disturbance   . Tachycardia     Past Surgical History:  Procedure Laterality Date  . ABDOMINAL HYSTERECTOMY  2000   /BSO - abn. Paps  . APPENDECTOMY  1978  . AUGMENTATION MAMMAPLASTY Bilateral 1981  . BLADDER REPAIR     x2  . BREAST ENHANCEMENT SURGERY  1987  . [place] Left 12/18/2016   Procedure: Left Heart Cath and Coronary Angiography;  Surgeon: Wellington Hampshire, MD;  Location: [facility];  Service: Cardiovascular;  Laterality: Left;  . CARPAL TUNNEL RELEASE Right   . CERVICAL DISCECTOMY  11/2009   with synthetic discs inserted--  Dr Arnoldo Morale  . NM MYOVIEW LTD  03/2005   EF 56%, no ischemia  . RECTAL  PROLAPSE REPAIR     x1  . SHOULDER SURGERY  09/2008, 01/2009   Right shoulder surgery, then redone with lysis of adhesions  . TOTAL HIP ARTHROPLASTY Right 05/29/2018   Procedure: TOTAL HIP ARTHROPLASTY ANTERIOR APPROACH;  Surgeon: Lovell Sheehan, MD;  Location: [facility];  Service: Orthopedics;  Laterality: Right;    Family History  Problem Relation Age of Onset  . Diabetes Mother   . Heart disease Mother   . Hypertension Mother   . Ovarian cancer Mother   . Melanoma Mother   . Cancer Father   . Coronary artery disease Father   . Heart disease Father   . Diabetes Sister   . Depression Sister   . Coronary artery disease Sister   . Heart disease Sister   . Rheum arthritis Maternal Aunt   . Breast cancer Maternal Grandmother   . Diabetes Maternal Grandmother   . Heart attack Paternal Uncle   . Heart attack Cousin   . Heart attack Paternal Uncle   . Diabetes Maternal Grandfather   . Colon cancer Neg Hx     Social History   Socioeconomic History  . Marital status: Married    Spouse name: Not on file  . Number of children: 5  . Years of education: Not on file  . Highest education level: Not on file  Occupational History  . Occupation: homemaker  Social Needs  . Financial resource strain: Not on file  . Food insecurity    Worry: Not on file    Inability: Not on file  . Transportation needs    Medical: Not on file    Non-medical: Not on file  Tobacco Use  . Smoking status: Never Smoker  . Smokeless tobacco: Never Used  Substance and Sexual Activity  . Alcohol use: No  . Drug use: No  . Sexual activity: Not on file  Lifestyle  . Physical activity    Days per week: Not on file    Minutes per session: Not on file  . Stress: Not on file  Relationships  . Social Herbalist on phone: Not on file    Gets together: Not on file    Attends religious service: Not on file    Active member of club or organization: Not on file    Attends meetings of clubs or  organizations: Not on file    Relationship status: Not on file  . Intimate partner violence    Fear of current or ex partner: Not on file    Emotionally abused: Not on file  Physically abused: Not on file    Forced sexual activity: Not on file  Other Topics Concern  . Not on file  Social History Narrative   Home maker   Doesn't exercise      [name] sister         Review of Systems No sig change in weight--down slightly Eating more vegetables    Objective:   Physical Exam  Constitutional: She appears well-developed. No distress.  Neck: No thyromegaly present.  Cardiovascular: Normal rate, regular rhythm and normal heart sounds. Exam reveals no gallop.  No murmur heard. Respiratory: Effort normal and breath sounds normal. No respiratory distress. She has no wheezes. She has no rales.  Musculoskeletal:        General: No edema.     Comments: Mild contracture at PIP left 3rd finger Fibrous tissue proximal to MCP also  Marked tenderness right 1st MTP (not overly red--last colchicine last night)  Lymphadenopathy:    She has no cervical adenopathy.           Assessment & Plan:

## 2019-07-04 NOTE — Assessment & Plan Note (Signed)
BP Readings from Last 3 Encounters:  07/04/19 126/80  05/16/19 118/74  01/29/19 110/78   Good control May need to stop HCTZ though

## 2019-07-04 NOTE — Assessment & Plan Note (Signed)
Will set up with Dr Lorelei Pont for injection

## 2019-07-04 NOTE — Assessment & Plan Note (Signed)
Fairly classic history Will Rx colchicine If recurrent, would stop the HCTZ

## 2019-07-05 ENCOUNTER — Other Ambulatory Visit: Payer: Self-pay | Admitting: Internal Medicine

## 2019-07-09 ENCOUNTER — Encounter: Payer: Self-pay | Admitting: Family Medicine

## 2019-07-09 ENCOUNTER — Other Ambulatory Visit: Payer: Self-pay

## 2019-07-09 ENCOUNTER — Ambulatory Visit: Payer: 59 | Admitting: Family Medicine

## 2019-07-09 VITALS — BP 122/74 | HR 90 | Temp 98.2°F | Ht 64.0 in | Wt 150.5 lb

## 2019-07-09 DIAGNOSIS — M24542 Contracture, left hand: Secondary | ICD-10-CM | POA: Diagnosis not present

## 2019-07-09 DIAGNOSIS — M65332 Trigger finger, left middle finger: Secondary | ICD-10-CM

## 2019-07-09 MED ORDER — METHYLPREDNISOLONE ACETATE 40 MG/ML IJ SUSP
20.0000 mg | Freq: Once | INTRAMUSCULAR | Status: AC
  Administered 2019-07-09: 20 mg via INTRA_ARTICULAR

## 2019-07-09 NOTE — Progress Notes (Signed)
     Kennis Buell T. Bernadette Armijo, MD Primary [facility] and [place] at [facility] [place], [address] Phone: [phone]  FAX: [place] - 63 y.o. female  MRN [id number]  Date of Birth: 04/04/1956  Visit Date: 07/09/2019  PCP: Venia Carbon, MD  Referred by: Venia Carbon, MD  Chief Complaint  Patient presents with  . Contracture Left Middle Finger   Subjective:   [name] is a 63 y.o. very pleasant female patient with Body mass index is 25.83 kg/m. who presents with the following:  Trigger finger:  L middle finger trigger finger  Tendon Sheath Injection Procedure Note AINARA ELDRIDGE Jan 27, 1956 Date of procedure: 07/09/2019  Procedure: Tendon Sheath Injection for Trigger Finger, L 3rd Indications: Pain  Procedure Details Verbal consent was obtained. Risks (including rare risk of infection, potential risk for skin lightening and potential atrophy), benefits and alternatives were discussed. Prepped with Chloraprep and Ethyl Chloride used for anesthesia. Under sterile conditions, patient injected at palmar crease aiming distally with 45 degree angle towards nodule; injected directly into tendon sheath. Medication flowed freely without resistance.  Needle size: 22 gauge 1 1/2 inch Injection: 1/2 cc of Lidocaine 1% and Depo-Medrol 20 mg Medication: Depo-Medrol 20 mg   Signed,  Merlean Pizzini T. Haliyah Fryman, MD

## 2019-08-25 ENCOUNTER — Other Ambulatory Visit: Payer: Self-pay | Admitting: Internal Medicine

## 2019-09-09 ENCOUNTER — Encounter: Payer: Self-pay | Admitting: Internal Medicine

## 2019-09-09 ENCOUNTER — Ambulatory Visit: Payer: 59 | Admitting: Internal Medicine

## 2019-09-09 ENCOUNTER — Other Ambulatory Visit: Payer: Self-pay

## 2019-09-09 VITALS — BP 122/84 | HR 96 | Temp 97.5°F | Ht 64.0 in | Wt 150.0 lb

## 2019-09-09 DIAGNOSIS — Z23 Encounter for immunization: Secondary | ICD-10-CM | POA: Diagnosis not present

## 2019-09-09 DIAGNOSIS — N390 Urinary tract infection, site not specified: Secondary | ICD-10-CM | POA: Diagnosis not present

## 2019-09-09 LAB — POC URINALSYSI DIPSTICK (AUTOMATED)
Glucose, UA: NEGATIVE
Leukocytes, UA: NEGATIVE
Nitrite, UA: NEGATIVE
Protein, UA: POSITIVE — AB
Spec Grav, UA: 1.02 (ref 1.010–1.025)
Urobilinogen, UA: 0.2 E.U./dL
pH, UA: 6 (ref 5.0–8.0)

## 2019-09-09 MED ORDER — SULFAMETHOXAZOLE-TRIMETHOPRIM 800-160 MG PO TABS: 1.0000 | ORAL_TABLET | Freq: Two times a day (BID) | ORAL | 1 refills | Status: DC

## 2019-09-09 NOTE — Patient Instructions (Signed)
Please start the antibiotic right away and take 2 today. If your symptoms are gone by tomorrow, you only need to take it for 3 days.

## 2019-09-09 NOTE — Addendum Note (Signed)
Addended by: Pilar Grammes on: 09/09/2019 01:58 PM   Modules accepted: Orders

## 2019-09-09 NOTE — Progress Notes (Signed)
Subjective:    Patient ID: Shannon [name], female    DOB: 1956-05-23, 63 y.o.   MRN: [id number]  HPI Here due to urinary symptoms  Felt a little funny yesterday Last night was bad--- urgency, frequency, dysuria and some pain in between Bad spasms No blood  Frequent infections when younger--none recently  Current Outpatient Medications on File Prior to Visit  Medication Sig Dispense Refill  . aspirin-acetaminophen-caffeine (EXCEDRIN MIGRAINE) 250-250-65 MG tablet Take 1 tablet by mouth every 6 (six) hours as needed for headache.    . cetirizine (ZYRTEC) 10 MG tablet Take 10 mg by mouth daily.    Marland Kitchen docusate sodium (COLACE) 100 MG capsule Take 300 mg by mouth at bedtime.     Marland Kitchen glucose blood (ONE TOUCH ULTRA TEST) test strip Use to check blood sugar 2 times daily 200 each 3  . Insulin Glargine (BASAGLAR KWIKPEN) 100 UNIT/ML SOPN Inject 0.05-0.2 mLs (5-20 Units total) into the skin at bedtime. 6 mL 3  . Insulin Pen Needle (PEN NEEDLES) 32G X 6 MM MISC 1 Units by Does not apply route daily. 100 each 3  . LUMIGAN 0.01 % SOLN Place 1 drop into both eyes at bedtime.     . metFORMIN (GLUCOPHAGE) 1000 MG tablet Take 1 tablet (1,000 mg total) by mouth 2 (two) times daily with a meal. 180 tablet 3  . metoprolol succinate (TOPROL-XL) 25 MG 24 hr tablet TAKE 1 AND 1/2 TABLETS BY  MOUTH DAILY 135 tablet 3  . MITIGARE 0.6 MG CAPS TAKE 1 CAPSULE BY MOUTH TWICE DAILY AS NEEDED    . ondansetron (ZOFRAN) 4 MG tablet TAKE 1 TABLET BY MOUTH THREE TIMES DAILY AS NEEDED FOR NAUSEA AND VOMITING 60 tablet 0  . ONETOUCH DELICA LANCETS FINE MISC Check blood sugar once daily and as needed. 250.00 100 each 1  . ONGLYZA 5 MG TABS tablet Take 1 tablet by mouth once daily 30 tablet 11  . pantoprazole (PROTONIX) 40 MG tablet TAKE 1 TABLET BY MOUTH 2  TIMES DAILY ON AN EMPTY  STOMACH 180 tablet 3  . rosuvastatin (CRESTOR) 5 MG tablet Take 1 tablet (5 mg total) by mouth 3 (three) times a week. 36 tablet 3  . traZODone  (DESYREL) 100 MG tablet TAKE 2 TABLETS BY MOUTH  EVERY NIGHT AT BEDTIME 180 tablet 3  . triamterene-hydrochlorothiazide (MAXZIDE-25) 37.5-25 MG tablet TAKE 1 TABLET BY MOUTH ONCE A DAY 90 tablet 3  . valACYclovir (VALTREX) 1000 MG tablet TAKE 2 TABLETS BY MOUTH  DAILY AS NEEDED (AT THE  ONSET OF SYMPTOMS AND TAKE  2 TABS 12 HOURS LATER) FOR FEVER BLISTERS. 90 tablet 0   No current facility-administered medications on file prior to visit.     Allergies  Allergen Reactions  . Lisinopril Cough  . Nitrofurantoin Other (See Comments)    Pt unsure  . Oxycodone-Acetaminophen Itching  . Atorvastatin Diarrhea    May have caused colitis--but not clear cut  . Clarithromycin Palpitations  . Glipizide Other (See Comments)    Sugars too variable  . Invokana [Canagliflozin]     Nausea, constipation    Past Medical History:  Diagnosis Date  . Allergy   . Asthma   . Depression   . Diabetes mellitus 11/12  . Family history of adverse reaction to anesthesia    PROBLEMS WITH BLOCKS  . GERD (gastroesophageal reflux disease)   . Glaucoma suspect   . Headache   . Hyperlipidemia   . Hypertension   .  Mitral valve prolapse   . Osteopenia   . Shingles 11/2006  . Sleep disturbance   . Tachycardia     Past Surgical History:  Procedure Laterality Date  . ABDOMINAL HYSTERECTOMY  2000   /BSO - abn. [facility]  . APPENDECTOMY  1978  . AUGMENTATION MAMMAPLASTY Bilateral 1981  . BLADDER REPAIR     x2  . BREAST ENHANCEMENT SURGERY  1987  . [place] Left 12/18/2016   Procedure: Left Heart Cath and Coronary Angiography;  Surgeon: Wellington Hampshire, MD;  Location: [facility];  Service: Cardiovascular;  Laterality: Left;  . CARPAL TUNNEL RELEASE Right   . CERVICAL DISCECTOMY  11/2009   with synthetic discs inserted--  Dr Arnoldo Morale  . NM MYOVIEW LTD  03/2005   EF 56%, no ischemia  . RECTAL PROLAPSE REPAIR     x1  . SHOULDER SURGERY  09/2008,  01/2009   Right shoulder surgery, then redone with lysis of adhesions  . TOTAL HIP ARTHROPLASTY Right 05/29/2018   Procedure: TOTAL HIP ARTHROPLASTY ANTERIOR APPROACH;  Surgeon: Lovell Sheehan, MD;  Location: [facility];  Service: Orthopedics;  Laterality: Right;    Family History  Problem Relation Age of Onset  . Diabetes Mother   . Heart disease Mother   . Hypertension Mother   . Ovarian cancer Mother   . Melanoma Mother   . Cancer Father   . Coronary artery disease Father   . Heart disease Father   . Diabetes Sister   . Depression Sister   . Coronary artery disease Sister   . Heart disease Sister   . Rheum arthritis Maternal Aunt   . Breast cancer Maternal Grandmother   . Diabetes Maternal Grandmother   . Heart attack Paternal Uncle   . Heart attack Cousin   . Heart attack Paternal Uncle   . Diabetes Maternal Grandfather   . Colon cancer Neg Hx     Social History   Socioeconomic History  . Marital status: Married    Spouse name: Not on file  . Number of children: 5  . Years of education: Not on file  . Highest education level: Not on file  Occupational History  . Occupation: homemaker  Social Needs  . Financial resource strain: Not on file  . Food insecurity    Worry: Not on file    Inability: Not on file  . Transportation needs    Medical: Not on file    Non-medical: Not on file  Tobacco Use  . Smoking status: Never Smoker  . Smokeless tobacco: Never Used  Substance and Sexual Activity  . Alcohol use: No  . Drug use: No  . Sexual activity: Not on file  Lifestyle  . Physical activity    Days per week: Not on file    Minutes per session: Not on file  . Stress: Not on file  Relationships  . Social Herbalist on phone: Not on file    Gets together: Not on file    Attends religious service: Not on file    Active member of club or organization: Not on file    Attends meetings of clubs or organizations: Not on file    Relationship status: Not on  file  . Intimate partner violence    Fear of current or ex partner: Not on file    Emotionally abused: Not on file    Physically abused: Not  on file    Forced sexual activity: Not on file  Other Topics Concern  . Not on file  Social History Narrative   Home maker   Doesn't exercise      Shannon [name] sister         Review of Systems Chronic nausea ---no change Intermittent back pain--feels achy in right CVA area No fever, chills, sweats Eating okay Sugars still up and down--mostly the same    Objective:   Physical Exam  Constitutional: She appears well-developed. No distress.  GI:  Moderate suprapubic and mild epigastric tenderness  Musculoskeletal:     Comments: No CVA tenderness           Assessment & Plan:

## 2019-09-09 NOTE — Assessment & Plan Note (Signed)
Not sure if just cystitis or UTI Will treat with septra for 3-7 days Culture only if fails Rx

## 2019-09-09 NOTE — Addendum Note (Signed)
Addended by: Pilar Grammes on: 09/09/2019 09:49 AM   Modules accepted: Orders

## 2019-09-23 ENCOUNTER — Telehealth: Payer: Self-pay

## 2019-09-23 NOTE — Telephone Encounter (Signed)
Patient states that is currently taking Basaglar and that this is not covered under her insurance. Patient states that she did contact her insurance company and they state that Lantus or Toujeo are covered, and she wants to know which one Dr. Silvio Pate prefer she take, and if he could send this is in [place] [facility]? Thanks!

## 2019-09-24 MED ORDER — LANTUS SOLOSTAR 100 UNIT/ML ~~LOC~~ SOPN: 10.0000 [IU] | PEN_INJECTOR | Freq: Every day | SUBCUTANEOUS | 3 refills | Status: DC

## 2019-09-24 NOTE — Telephone Encounter (Signed)
Spoke to pt to verify units. She said usually 8. So we made it 10 just in case she needs to go up. Rx sent to [facility] for Lantus.

## 2019-09-24 NOTE — Telephone Encounter (Signed)
Please change to lantus (it is the exact same medication made by a different company

## 2019-09-24 NOTE — Addendum Note (Signed)
Addended by: Pilar Grammes on: 09/24/2019 10:04 AM   Modules accepted: Orders

## 2019-11-19 ENCOUNTER — Other Ambulatory Visit: Payer: Self-pay | Admitting: Internal Medicine

## 2019-11-21 ENCOUNTER — Other Ambulatory Visit: Payer: Self-pay | Admitting: Internal Medicine

## 2019-12-08 ENCOUNTER — Telehealth: Payer: Self-pay

## 2019-12-08 NOTE — Telephone Encounter (Signed)
[facility] - Client TELEPHONE ADVICE RECORD [facility] Patient Name: Shannon [name] Gender: Female DOB: [birth year] Age: 64 Y 9 M 25 D Return Phone Number: [phone] (Primary), [phone] (Secondary) Address: [place] City/State/Zip: [place] [address] Client [facility] - Client Client Site [facility] - Day Physician Viviana Simpler - MD Contact Type Call Who Is Calling Patient / Member / Family / Caregiver Call Type Triage / Clinical Relationship To Patient Self Return Phone Number [phone] (Secondary) Chief Complaint BREATHING - shortness of breath or sounds breathless Reason for Call Symptomatic / Request for [place] states she is from the office and needs [place] to triage a patient. Caller states she has felt week for a couple days and has had a bad headache. Today she has a cough and shortness of breath. She has diarrhea as well. Translation No Nurse Assessment Nurse: May, RN, Tammy Date/Time Eilene Ghazi Time): 12/05/2019 4:08:23 PM Confirm and document reason for call. If symptomatic, describe symptoms. ---Caller states she has had diarrhea a few days ago, she has gotten weak. Noticed a cough developing and is short of breath walking up the steps. Caller is concerned about covid. Has the patient had close contact with a person known or suspected to have the novel coronavirus illness OR traveled / lives in area with major community spread (including international travel) in the last 14 days from the onset of symptoms? * If Asymptomatic, screen for exposure and travel within the last 14 days. ---Yes Does the patient have any new or worsening symptoms? ---Yes Will a triage be completed? ---Yes Related visit to physician within the last 2 weeks? ---No Does the PT have any chronic conditions? (i.e. diabetes, asthma, this includes High risk factors for  pregnancy, etc.) ---Yes List chronic conditions. ---DM, HTN Is this a behavioral health or substance abuse call? ---No Guidelines Guideline Title Affirmed Question Affirmed Notes Nurse Date/Time (Eastern Time) Coronavirus (COVID-19) - Diagnosed or Suspected MILD difficulty breathing (e.g., minimal/no SOB at May, RN, DISH 12/05/2019 4:11:07 PM PLEASE NOTE: All timestamps contained within this report are represented as [place] Standard Time. CONFIDENTIALTY NOTICE: This fax transmission is intended only for the addressee. It contains information that is legally privileged, confidential or otherwise protected from use or disclosure. If you are not the intended recipient, you are strictly prohibited from reviewing, disclosing, copying using or disseminating any of this information or taking any action in reliance on or regarding this information. If you have received this fax in error, please notify us immediately by telephone so that we can arrange for its return to [place]. Phone: [phone], Toll-Free: [phone], Fax: [phone] Page: 2 of 2 Call Id: [id number] Guidelines Guideline Title Affirmed Question Affirmed Notes Nurse Date/Time Eilene Ghazi Time) rest, SOB with walking, pulse <100) Disp. Time Eilene Ghazi Time) Disposition Final User 12/05/2019 4:06:19 PM Send to Urgent Sallyanne Havers, [place] 12/05/2019 4:21:34 PM Go to ED Now (or PCP triage) Yes May, RN, Tammy Caller Disagree/Comply Comply Caller Understands Yes PreDisposition InappropriateToAsk Care Advice Given Per Guideline GO TO ED NOW (OR PCP TRIAGE): * IF NO PCP (PRIMARY CARE PROVIDER) SECOND-LEVEL TRIAGE: You need to be seen within the next hour. Go to the [facility] at _____________ [place] as soon as you can. * Telemedicine may be your best choice for care during this COVID-19 outbreak. * You should call a telemedicine provider now, if your own healthcare provider is not available. GENERAL CARE  ADVICE FOR COVID-19  SYMPTOMS: * The treatment is the same whether you have COVID-19, influenza or some other respiratory virus. * Cough: Use cough drops. * Feeling dehydrated: Drink extra liquids. If the air in your home is dry, use a humidifier. * Fever: For fever over 101 F (38.3 C), take acetaminophen every 4 to 6 hours (Adults 650 mg) OR ibuprofen every 6-8 hours (Adults 400 mg). Before taking any medicine, read all the instructions on the package. Do not take aspirin unless your doctor has prescribed it for you. * Muscle aches, headache, and other pains: Often this comes and goes with the fever. Take acetaminophen every 4-6 hours (Adults 650 mg) OR ibuprofen every 6 to 8 hours (Adults 400 mg). Before taking any medicine, read all the instructions on the package. CARE ADVICE given per CORONAVIRUS (COVID-19) - DIAGNOSED OR SUSPECTED (Adult) guideline. * Sore throat: Try throat lozenges, hard candy or warm chicken broth. Referrals GO TO FACILITY UNDECIDED

## 2019-12-08 NOTE — Telephone Encounter (Signed)
I spoke with pt; pt went to [facility] on 12/05/19; pt tested for covid on 12/05/19 and pt said this morning was reported to her covid test was negative. Today pt has watery diarrhea again. Pt has had diarrhea since the 12/05/19; pt feels weak and fatigued, has dull abd pain which seems worse on lt side at waistline that comes and goes. Now pain level is 2. Last h/a was 12/06/19. ? Fever and chilling; pt usually has muscle and joint pain that is not any worse than usual. No cough now. Upon exertion pt has SOB and elevated heart rate. When got up to restroom heart rate 124 and SOB. Now pt is resting and heart rate 88 and not SOB. Pt does not want to schedule virtual appt today and wants to rest to see if feels better and if she does not feel better will call 12/09/19 or sooner for virtual appt. UC & ED precautions given and pt voiced understanding. FYI to Dr Silvio Pate.

## 2019-12-08 NOTE — Telephone Encounter (Signed)
Just encourage her to keep up with fluids. She may want to use rehydration solutions if she can get it (like pedialyte or even gatorade without sugar)

## 2019-12-08 NOTE — Telephone Encounter (Signed)
Spoken and notified patient of Dr Alla German comments. Patient verbalized understanding.

## 2019-12-15 ENCOUNTER — Telehealth: Payer: Self-pay | Admitting: Cardiovascular Disease

## 2019-12-15 NOTE — Telephone Encounter (Signed)
   [facility] Pre-operative Risk Assessment    Request for surgical clearance:  1. What type of surgery is being performed? LEFT SHOULDER ARTHOSCOPY  2. When is this surgery scheduled? TBD  3. What type of clearance is required (medical clearance vs. Pharmacy clearance to hold med vs. Both)? MEDICAL  4. Are there any medications that need to be held prior to surgery and how long? NOT LISTED  5. Practice name and name of physician performing surgery? [facility] [place]  6. What is your office phone number [phone]   7.   What is your office fax number [phone]  8.   Anesthesia type (None, local, MAC, general) ? GENERAL   Lucienne Minks 12/15/2019, 3:47 PM  _________________________________________________________________   (provider comments below)

## 2019-12-16 NOTE — Telephone Encounter (Signed)
   Primary Cardiologist: No primary care provider on file.  Chart reviewed as part of pre-operative protocol coverage. Patient was contacted 12/16/2019 in reference to pre-operative risk assessment for pending surgery as outlined below.  Shannon [name] was last seen on 05/16/2019 by Dr. Saunders Revel.  Since that day, Shannon [name] has done well with no new cardiac complaints. She has a history of cardiac cath in 2018 showing no evidence of obstructive CAD and normal LVEF by echo with no significant valvular abnormalities.   Therefore, based on ACC/AHA guidelines, the patient would be at acceptable risk for the planned procedure without further cardiovascular testing.   I will route this recommendation to the requesting party via Epic fax function and remove from pre-op pool.  Please call with questions.  Daune Perch, NP 12/16/2019, 10:20 AM

## 2019-12-22 ENCOUNTER — Ambulatory Visit (INDEPENDENT_AMBULATORY_CARE_PROVIDER_SITE_OTHER): Payer: 59 | Admitting: Internal Medicine

## 2019-12-22 ENCOUNTER — Encounter: Payer: Self-pay | Admitting: Internal Medicine

## 2019-12-22 ENCOUNTER — Other Ambulatory Visit: Payer: Self-pay

## 2019-12-22 VITALS — BP 118/78 | HR 113 | Temp 97.6°F | Resp 97 | Ht 64.0 in | Wt 151.0 lb

## 2019-12-22 DIAGNOSIS — K529 Noninfective gastroenteritis and colitis, unspecified: Secondary | ICD-10-CM

## 2019-12-22 DIAGNOSIS — Z01818 Encounter for other preprocedural examination: Secondary | ICD-10-CM | POA: Diagnosis not present

## 2019-12-22 DIAGNOSIS — E1149 Type 2 diabetes mellitus with other diabetic neurological complication: Secondary | ICD-10-CM

## 2019-12-22 DIAGNOSIS — Z0000 Encounter for general adult medical examination without abnormal findings: Secondary | ICD-10-CM

## 2019-12-22 DIAGNOSIS — I10 Essential (primary) hypertension: Secondary | ICD-10-CM

## 2019-12-22 LAB — HM DIABETES FOOT EXAM

## 2019-12-22 MED ORDER — ONDANSETRON HCL 4 MG PO TABS: 4.0000 mg | ORAL_TABLET | Freq: Three times a day (TID) | ORAL | 0 refills | Status: DC | PRN

## 2019-12-22 MED ORDER — BUTALBITAL-APAP-CAFFEINE 50-325-40 MG PO TABS: 1.0000 | ORAL_TABLET | Freq: Four times a day (QID) | ORAL | 0 refills | Status: DC | PRN

## 2019-12-22 NOTE — Assessment & Plan Note (Signed)
BP Readings from Last 3 Encounters:  12/22/19 118/78  09/09/19 122/84  07/09/19 122/74   Good control

## 2019-12-22 NOTE — Assessment & Plan Note (Signed)
No worrisome symptoms now Had negative cardiac cath in 2018 and EKG okay in June She is concerned about A1c being up----will leave to ortho about doing procedure if over 7.5% No medical contraindications to this low risk procedure

## 2019-12-22 NOTE — Assessment & Plan Note (Signed)
Recurrent bowel problems that might be med related Will set up with GI Also had adenomatous polyps in past (last 2016)

## 2019-12-22 NOTE — Assessment & Plan Note (Signed)
Control is worse Will check labs Is on statin Sensation in feet is better

## 2019-12-22 NOTE — Progress Notes (Signed)
Subjective:    Patient ID: Shannon [name], female    DOB: 19-May-1956, 64 y.o.   MRN: [id number]  HPI Here for physical and medical clearance for shoulder arthroscopy This visit occurred during the SARS-CoV-2 public health emergency.  Safety protocols were in place, including screening questions prior to the visit, additional usage of staff PPE, and extensive cleaning of exam room while observing appropriate contact time as indicated for disinfecting solutions.   Sugars have been running higher She checks every night and most mornings Up to 200 many nights, AM averages 140's Only a few mild hypoglycemic reactions Feet sensation fairly normal  Had some chest pain with illness---wasn't COVID Heart rate stays up some fairly regularly No SOB No dizziness or syncope No edema Not exercising much---shoulder and left hip limiting her some  Current Outpatient Medications on File Prior to Visit  Medication Sig Dispense Refill  . aspirin-acetaminophen-caffeine (EXCEDRIN MIGRAINE) 250-250-65 MG tablet Take 1 tablet by mouth every 6 (six) hours as needed for headache.    . cetirizine (ZYRTEC) 10 MG tablet Take 10 mg by mouth daily.    Marland Kitchen docusate sodium (COLACE) 100 MG capsule Take 300 mg by mouth at bedtime.     . Insulin Glargine (LANTUS SOLOSTAR) 100 UNIT/ML Solostar Pen Inject 10 Units into the skin daily. 5 pen 3  . Insulin Pen Needle (PEN NEEDLES) 32G X 6 MM MISC 1 Units by Does not apply route daily. 100 each 3  . LUMIGAN 0.01 % SOLN Place 1 drop into both eyes at bedtime.     . metFORMIN (GLUCOPHAGE) 1000 MG tablet Take 1 tablet (1,000 mg total) by mouth 2 (two) times daily with a meal. 180 tablet 3  . metoprolol succinate (TOPROL-XL) 25 MG 24 hr tablet TAKE 1 AND 1/2 TABLETS BY  MOUTH DAILY 135 tablet 3  . MITIGARE 0.6 MG CAPS TAKE 1 CAPSULE BY MOUTH TWICE DAILY AS NEEDED    . ondansetron (ZOFRAN) 4 MG tablet TAKE 1 TABLET BY MOUTH THREE TIMES DAILY AS NEEDED FOR NAUSEA AND VOMITING 60  tablet 0  . ONETOUCH DELICA LANCETS FINE MISC Check blood sugar once daily and as needed. 250.00 100 each 1  . ONETOUCH ULTRA test strip USE TO CHECK BLOOD SUGAR 2  TIMES DAILY 200 strip 3  . ONGLYZA 5 MG TABS tablet Take 1 tablet by mouth once daily 30 tablet 11  . pantoprazole (PROTONIX) 40 MG tablet TAKE 1 TABLET BY MOUTH 2  TIMES DAILY ON AN EMPTY  STOMACH 180 tablet 3  . rosuvastatin (CRESTOR) 5 MG tablet TAKE 1 TABLET BY MOUTH 3  TIMES WEEKLY 39 tablet 3  . traZODone (DESYREL) 100 MG tablet TAKE 2 TABLETS BY MOUTH  EVERY NIGHT AT BEDTIME 180 tablet 3  . triamterene-hydrochlorothiazide (MAXZIDE-25) 37.5-25 MG tablet TAKE 1 TABLET BY MOUTH ONCE A DAY 90 tablet 3  . valACYclovir (VALTREX) 1000 MG tablet TAKE 2 TABLETS BY MOUTH  DAILY AS NEEDED (AT THE  ONSET OF SYMPTOMS AND TAKE  2 TABS 12 HOURS LATER) FOR FEVER BLISTERS. 90 tablet 0   No current facility-administered medications on file prior to visit.    Allergies  Allergen Reactions  . Lisinopril Cough  . Nitrofurantoin Other (See Comments)    Pt unsure  . Oxycodone-Acetaminophen Itching  . Atorvastatin Diarrhea    May have caused colitis--but not clear cut  . Clarithromycin Palpitations  . Glipizide Other (See Comments)    Sugars too variable  . Invokana [  Canagliflozin]     Nausea, constipation    Past Medical History:  Diagnosis Date  . Allergy   . Asthma   . Depression   . Diabetes mellitus 11/12  . Family history of adverse reaction to anesthesia    PROBLEMS WITH BLOCKS  . GERD (gastroesophageal reflux disease)   . Glaucoma suspect   . Headache   . Hyperlipidemia   . Hypertension   . Mitral valve prolapse   . Osteopenia   . Shingles 11/2006  . Sleep disturbance   . Tachycardia     Past Surgical History:  Procedure Laterality Date  . ABDOMINAL HYSTERECTOMY  2000   /BSO - abn. [facility]  . APPENDECTOMY  1978  . AUGMENTATION MAMMAPLASTY Bilateral 1981  . BLADDER REPAIR     x2  . BREAST ENHANCEMENT SURGERY   1987  . [place] Left 12/18/2016   Procedure: Left Heart Cath and Coronary Angiography;  Surgeon: Wellington Hampshire, MD;  Location: [facility];  Service: Cardiovascular;  Laterality: Left;  . CARPAL TUNNEL RELEASE Right   . CERVICAL DISCECTOMY  11/2009   with synthetic discs inserted--  Dr Arnoldo Morale  . NM MYOVIEW LTD  03/2005   EF 56%, no ischemia  . RECTAL PROLAPSE REPAIR     x1  . SHOULDER SURGERY  09/2008, 01/2009   Right shoulder surgery, then redone with lysis of adhesions  . TOTAL HIP ARTHROPLASTY Right 05/29/2018   Procedure: TOTAL HIP ARTHROPLASTY ANTERIOR APPROACH;  Surgeon: Lovell Sheehan, MD;  Location: [facility];  Service: Orthopedics;  Laterality: Right;    Family History  Problem Relation Age of Onset  . Diabetes Mother   . Heart disease Mother   . Hypertension Mother   . Ovarian cancer Mother   . Melanoma Mother   . Cancer Father   . Coronary artery disease Father   . Heart disease Father   . Diabetes Sister   . Depression Sister   . Coronary artery disease Sister   . Heart disease Sister   . Rheum arthritis Maternal Aunt   . Breast cancer Maternal Grandmother   . Diabetes Maternal Grandmother   . Heart attack Paternal Uncle   . Heart attack Cousin   . Heart attack Paternal Uncle   . Diabetes Maternal Grandfather   . Colon cancer Neg Hx     Social History   Socioeconomic History  . Marital status: Married    Spouse name: Not on file  . Number of children: 5  . Years of education: Not on file  . Highest education level: Not on file  Occupational History  . Occupation: homemaker  Tobacco Use  . Smoking status: Never Smoker  . Smokeless tobacco: Never Used  Substance and Sexual Activity  . Alcohol use: No  . Drug use: No  . Sexual activity: Not on file  Other Topics Concern  . Not on file  Social History Narrative   Home maker   Doesn't exercise      Shannon [name] sister          Social Determinants of Health   Financial Resource Strain:   . Difficulty of Paying Living Expenses: Not on file  Food Insecurity:   . Worried About Charity fundraiser in the Last Year: Not on file  . Ran Out of Food in the Last Year: Not on file  Transportation Needs:   . Lack of Transportation (  Medical): Not on file  . Lack of Transportation (Non-Medical): Not on file  Physical Activity:   . Days of Exercise per Week: Not on file  . Minutes of Exercise per Session: Not on file  Stress:   . Feeling of Stress : Not on file  Social Connections:   . Frequency of Communication with Friends and Family: Not on file  . Frequency of Social Gatherings with Friends and Family: Not on file  . Attends Religious Services: Not on file  . Active Member of Clubs or Organizations: Not on file  . Attends Archivist Meetings: Not on file  . Marital Status: Not on file  Intimate Partner Violence:   . Fear of Current or Ex-Partner: Not on file  . Emotionally Abused: Not on file  . Physically Abused: Not on file  . Sexually Abused: Not on file   Review of Systems  Constitutional: Negative for fatigue.       Weight up slightly Wears seat belt  HENT:       Mild hearing loss occ tinnitus Keeps up with dentist  Eyes: Negative for visual disturbance.       Eye exam scheduled next month  Respiratory: Negative for cough, chest tightness and shortness of breath.   Cardiovascular: Negative for palpitations and leg swelling.  Gastrointestinal: Negative for blood in stool and constipation.       Having some abdominal issues---stomach pain, diarrhea (discussed seeing GI)  Endocrine: Negative for polydipsia and polyuria.  Genitourinary: Positive for dyspareunia. Negative for dysuria and hematuria.  Musculoskeletal: Positive for arthralgias. Negative for joint swelling.  Skin: Negative for rash.  Allergic/Immunologic: Positive for environmental allergies. Negative for immunocompromised  state.       Still on cetirizine and flonase---this helps  Neurological: Positive for headaches. Negative for dizziness, syncope and light-headedness.       Excedrin helps headaches but can't take it now  Hematological: Negative for adenopathy. Does not bruise/bleed easily.  Psychiatric/Behavioral: Negative for dysphoric mood. The patient is not nervous/anxious.        Not sleeping well in past week---generally does okay       Objective:   Physical Exam  Constitutional: She is oriented to person, place, and time. She appears well-developed. No distress.  HENT:  Head: Normocephalic and atraumatic.  Right Ear: External ear normal.  Left Ear: External ear normal.  Mouth/Throat: Oropharynx is clear and moist. No oropharyngeal exudate.  Eyes: Pupils are equal, round, and reactive to light. Conjunctivae are normal.  Neck: No thyromegaly present.  Cardiovascular: Normal rate, regular rhythm, normal heart sounds and intact distal pulses. Exam reveals no gallop.  No murmur heard. Respiratory: Effort normal and breath sounds normal. No respiratory distress. She has no wheezes. She has no rales.  GI: Soft. There is no abdominal tenderness.  Musculoskeletal:        General: No tenderness or edema.  Lymphadenopathy:    She has no cervical adenopathy.  Neurological: She is alert and oriented to person, place, and time.  Fairly normal sensation in feet  Skin:  No foot lesions  Psychiatric: She has a normal mood and affect. Her behavior is normal.           Assessment & Plan:

## 2019-12-22 NOTE — Assessment & Plan Note (Signed)
Colonoscopy due now---Dr Vira Agar retired. Will refer to [facility] (wants outpt endoscopy) Mammogram due again in the summer No PAP due to hyster Not sure about COVID vaccine Yearly flu vaccine Discussed fitness

## 2019-12-23 ENCOUNTER — Encounter: Payer: Self-pay | Admitting: Orthopedic Surgery

## 2019-12-23 ENCOUNTER — Other Ambulatory Visit: Payer: Self-pay

## 2019-12-23 ENCOUNTER — Encounter: Payer: Self-pay | Admitting: Gastroenterology

## 2019-12-23 LAB — RENAL FUNCTION PANEL
Albumin: 4.6 g/dL (ref 3.5–5.2)
BUN: 16 mg/dL (ref 6–23)
CO2: 26 mEq/L (ref 19–32)
Calcium: 9.9 mg/dL (ref 8.4–10.5)
Chloride: 101 mEq/L (ref 96–112)
Creatinine, Ser: 1.02 mg/dL (ref 0.40–1.20)
GFR: 54.93 mL/min — ABNORMAL LOW (ref >60.00)
Glucose, Bld: 176 mg/dL — ABNORMAL HIGH (ref 70–99)
Phosphorus: 3.8 mg/dL (ref 2.3–4.6)
Potassium: 4.6 mEq/L (ref 3.5–5.1)
Sodium: 141 mEq/L (ref 135–145)

## 2019-12-23 LAB — HEMOGLOBIN A1C: Hgb A1c MFr Bld: 8.1 % — ABNORMAL HIGH (ref 4.6–6.5)

## 2019-12-23 LAB — HEPATIC FUNCTION PANEL
ALT: 29 U/L (ref 0–35)
AST: 22 U/L (ref 0–37)
Albumin: 4.6 g/dL (ref 3.5–5.2)
Alkaline Phosphatase: 79 U/L (ref 39–117)
Bilirubin, Direct: 0.1 mg/dL (ref 0.0–0.3)
Total Bilirubin: 0.3 mg/dL (ref 0.2–1.2)
Total Protein: 7.1 g/dL (ref 6.0–8.3)

## 2019-12-23 LAB — CBC
HCT: 39.4 % (ref 36.0–46.0)
Hemoglobin: 13 g/dL (ref 12.0–15.0)
MCHC: 33.1 g/dL (ref 30.0–36.0)
MCV: 84.2 fl (ref 78.0–100.0)
Platelets: 316 10*3/uL (ref 150.0–400.0)
RBC: 4.68 Mil/uL (ref 3.87–5.11)
RDW: 14.5 % (ref 11.5–15.5)
WBC: 9.7 10*3/uL (ref 4.0–10.5)

## 2019-12-23 LAB — LIPID PANEL
Cholesterol: 180 mg/dL (ref 0–200)
HDL: 48.7 mg/dL (ref >39.00)
NonHDL: 131.68
Total CHOL/HDL Ratio: 4
Triglycerides: 335 mg/dL — ABNORMAL HIGH (ref 0.0–149.0)
VLDL: 67 mg/dL — ABNORMAL HIGH (ref 0.0–40.0)

## 2019-12-23 LAB — LDL CHOLESTEROL, DIRECT: Direct LDL: 105 mg/dL

## 2019-12-23 LAB — T4, FREE: Free T4: 0.99 ng/dL (ref 0.60–1.60)

## 2019-12-24 ENCOUNTER — Other Ambulatory Visit: Payer: Self-pay | Admitting: Orthopedic Surgery

## 2019-12-24 ENCOUNTER — Ambulatory Visit: Payer: 59 | Admitting: Internal Medicine

## 2019-12-25 ENCOUNTER — Other Ambulatory Visit
Admission: RE | Admit: 2019-12-25 | Discharge: 2019-12-25 | Disposition: A | Discharge status: Home / Self Care | Payer: 59 | Source: Ambulatory Visit | Attending: Orthopedic Surgery | Admitting: Orthopedic Surgery

## 2019-12-25 DIAGNOSIS — Z01812 Encounter for preprocedural laboratory examination: Secondary | ICD-10-CM | POA: Diagnosis present

## 2019-12-25 DIAGNOSIS — Z20822 Contact with and (suspected) exposure to covid-19: Secondary | ICD-10-CM | POA: Insufficient documentation

## 2019-12-26 LAB — SARS CORONAVIRUS 2 (TAT 6-24 HRS): SARS Coronavirus 2: NEGATIVE

## 2019-12-29 ENCOUNTER — Ambulatory Visit: Payer: Self-pay | Admitting: Anesthesiology

## 2019-12-29 ENCOUNTER — Encounter: Payer: Self-pay | Admitting: Anesthesiology

## 2019-12-29 ENCOUNTER — Encounter: Payer: Self-pay | Admitting: Orthopedic Surgery

## 2019-12-29 ENCOUNTER — Encounter
Admission: RE | Disposition: A | Discharge status: Home / Self Care | Payer: Self-pay | Source: Home / Self Care | Attending: Orthopedic Surgery

## 2019-12-29 ENCOUNTER — Ambulatory Visit: Payer: Self-pay

## 2019-12-29 ENCOUNTER — Other Ambulatory Visit: Payer: Self-pay

## 2019-12-29 ENCOUNTER — Ambulatory Visit
Admission: RE | Admit: 2019-12-29 | Discharge: 2019-12-29 | Disposition: A | Discharge status: Home / Self Care | Payer: 59 | Attending: Orthopedic Surgery | Admitting: Orthopedic Surgery

## 2019-12-29 DIAGNOSIS — M75122 Complete rotator cuff tear or rupture of left shoulder, not specified as traumatic: Secondary | ICD-10-CM | POA: Insufficient documentation

## 2019-12-29 DIAGNOSIS — Z981 Arthrodesis status: Secondary | ICD-10-CM | POA: Diagnosis not present

## 2019-12-29 DIAGNOSIS — G479 Sleep disorder, unspecified: Secondary | ICD-10-CM | POA: Diagnosis not present

## 2019-12-29 DIAGNOSIS — Z8261 Family history of arthritis: Secondary | ICD-10-CM | POA: Insufficient documentation

## 2019-12-29 DIAGNOSIS — F329 Major depressive disorder, single episode, unspecified: Secondary | ICD-10-CM | POA: Insufficient documentation

## 2019-12-29 DIAGNOSIS — K760 Fatty (change of) liver, not elsewhere classified: Secondary | ICD-10-CM | POA: Insufficient documentation

## 2019-12-29 DIAGNOSIS — M2240 Chondromalacia patellae, unspecified knee: Secondary | ICD-10-CM | POA: Insufficient documentation

## 2019-12-29 DIAGNOSIS — E119 Type 2 diabetes mellitus without complications: Secondary | ICD-10-CM | POA: Diagnosis not present

## 2019-12-29 DIAGNOSIS — E785 Hyperlipidemia, unspecified: Secondary | ICD-10-CM | POA: Diagnosis not present

## 2019-12-29 DIAGNOSIS — M7552 Bursitis of left shoulder: Secondary | ICD-10-CM | POA: Diagnosis not present

## 2019-12-29 DIAGNOSIS — R42 Dizziness and giddiness: Secondary | ICD-10-CM | POA: Insufficient documentation

## 2019-12-29 DIAGNOSIS — Z885 Allergy status to narcotic agent status: Secondary | ICD-10-CM | POA: Diagnosis not present

## 2019-12-29 DIAGNOSIS — Z833 Family history of diabetes mellitus: Secondary | ICD-10-CM | POA: Insufficient documentation

## 2019-12-29 DIAGNOSIS — K219 Gastro-esophageal reflux disease without esophagitis: Secondary | ICD-10-CM | POA: Insufficient documentation

## 2019-12-29 DIAGNOSIS — M25519 Pain in unspecified shoulder: Secondary | ICD-10-CM

## 2019-12-29 DIAGNOSIS — Z96649 Presence of unspecified artificial hip joint: Secondary | ICD-10-CM | POA: Diagnosis not present

## 2019-12-29 DIAGNOSIS — M7582 Other shoulder lesions, left shoulder: Secondary | ICD-10-CM | POA: Insufficient documentation

## 2019-12-29 DIAGNOSIS — R000 Tachycardia, unspecified: Secondary | ICD-10-CM | POA: Diagnosis not present

## 2019-12-29 DIAGNOSIS — G43909 Migraine, unspecified, not intractable, without status migrainosus: Secondary | ICD-10-CM | POA: Insufficient documentation

## 2019-12-29 DIAGNOSIS — Z8249 Family history of ischemic heart disease and other diseases of the circulatory system: Secondary | ICD-10-CM | POA: Insufficient documentation

## 2019-12-29 DIAGNOSIS — Z794 Long term (current) use of insulin: Secondary | ICD-10-CM | POA: Insufficient documentation

## 2019-12-29 DIAGNOSIS — M8580 Other specified disorders of bone density and structure, unspecified site: Secondary | ICD-10-CM | POA: Diagnosis not present

## 2019-12-29 DIAGNOSIS — Z841 Family history of disorders of kidney and ureter: Secondary | ICD-10-CM | POA: Insufficient documentation

## 2019-12-29 DIAGNOSIS — I341 Nonrheumatic mitral (valve) prolapse: Secondary | ICD-10-CM | POA: Insufficient documentation

## 2019-12-29 DIAGNOSIS — I10 Essential (primary) hypertension: Secondary | ICD-10-CM | POA: Insufficient documentation

## 2019-12-29 DIAGNOSIS — Z888 Allergy status to other drugs, medicaments and biological substances status: Secondary | ICD-10-CM | POA: Insufficient documentation

## 2019-12-29 DIAGNOSIS — M25512 Pain in left shoulder: Secondary | ICD-10-CM

## 2019-12-29 HISTORY — DX: Nausea with vomiting, unspecified: Z98.890

## 2019-12-29 HISTORY — DX: Fatty (change of) liver, not elsewhere classified: K76.0

## 2019-12-29 HISTORY — DX: Unspecified osteoarthritis, unspecified site: M19.90

## 2019-12-29 HISTORY — DX: Other complications of anesthesia, initial encounter: T88.59XA

## 2019-12-29 HISTORY — PX: SHOULDER ARTHROSCOPY WITH OPEN ROTATOR CUFF REPAIR: SHX6092

## 2019-12-29 HISTORY — DX: Dizziness and giddiness: R42

## 2019-12-29 HISTORY — DX: Other specified postprocedural states: R11.2

## 2019-12-29 HISTORY — DX: Nausea with vomiting, unspecified: R11.2

## 2019-12-29 LAB — URINALYSIS, ROUTINE W REFLEX MICROSCOPIC
Bilirubin Urine: NEGATIVE
Glucose, UA: NEGATIVE mg/dL
Hgb urine dipstick: NEGATIVE
Ketones, ur: NEGATIVE mg/dL
Leukocytes,Ua: NEGATIVE
Nitrite: NEGATIVE
Protein, ur: NEGATIVE mg/dL
Specific Gravity, Urine: 1.01 (ref 1.005–1.030)
pH: 5 (ref 5.0–8.0)

## 2019-12-29 LAB — GLUCOSE, CAPILLARY
Glucose-Capillary: 125 mg/dL — ABNORMAL HIGH (ref 70–99)
Glucose-Capillary: 151 mg/dL — ABNORMAL HIGH (ref 70–99)

## 2019-12-29 SURGERY — ARTHROSCOPY, SHOULDER WITH REPAIR, ROTATOR CUFF, OPEN
Anesthesia: General | Site: Shoulder | Laterality: Left

## 2019-12-29 MED ORDER — BUPIVACAINE LIPOSOME 1.3 % IJ SUSP
INTRAMUSCULAR | Status: AC
  Filled 2019-12-29: qty 20

## 2019-12-29 MED ORDER — LACTATED RINGERS IV SOLN: INTRAVENOUS | Status: DC

## 2019-12-29 MED ORDER — ACETAMINOPHEN 325 MG PO TABS: 325.0000 mg | ORAL_TABLET | Freq: Four times a day (QID) | ORAL | Status: DC | PRN

## 2019-12-29 MED ORDER — GABAPENTIN 300 MG PO CAPS: 300.0000 mg | ORAL_CAPSULE | Freq: Four times a day (QID) | ORAL | 2 refills | Status: DC | PRN

## 2019-12-29 MED ORDER — DEXAMETHASONE SODIUM PHOSPHATE 10 MG/ML IJ SOLN
INTRAMUSCULAR | Status: AC
  Filled 2019-12-29: qty 1

## 2019-12-29 MED ORDER — HYDROCODONE-ACETAMINOPHEN 5-325 MG PO TABS: 1.0000 | ORAL_TABLET | ORAL | 0 refills | Status: DC | PRN

## 2019-12-29 MED ORDER — MIDAZOLAM HCL 2 MG/2ML IJ SOLN: 1.0000 mg | Freq: Once | INTRAMUSCULAR | Status: DC

## 2019-12-29 MED ORDER — MIDAZOLAM HCL 2 MG/2ML IJ SOLN
INTRAMUSCULAR | Status: AC
  Administered 2019-12-29: 2 mg via INTRAVENOUS
  Filled 2019-12-29: qty 2

## 2019-12-29 MED ORDER — MIDAZOLAM HCL 2 MG/2ML IJ SOLN
INTRAMUSCULAR | Status: AC
  Filled 2019-12-29: qty 2

## 2019-12-29 MED ORDER — CEFAZOLIN SODIUM-DEXTROSE 2-4 GM/100ML-% IV SOLN
2.0000 g | INTRAVENOUS | Status: AC
  Administered 2019-12-29: 13:00:00 2 g via INTRAVENOUS

## 2019-12-29 MED ORDER — SCOPOLAMINE 1 MG/3DAYS TD PT72: 1.0000 | MEDICATED_PATCH | TRANSDERMAL | Status: DC

## 2019-12-29 MED ORDER — METOCLOPRAMIDE HCL 5 MG/ML IJ SOLN: 5.0000 mg | Freq: Three times a day (TID) | INTRAMUSCULAR | Status: DC | PRN

## 2019-12-29 MED ORDER — BUPIVACAINE HCL (PF) 0.25 % IJ SOLN
INTRAMUSCULAR | Status: AC
  Filled 2019-12-29: qty 30

## 2019-12-29 MED ORDER — MIDAZOLAM HCL 2 MG/2ML IJ SOLN: 2.0000 mg | Freq: Once | INTRAMUSCULAR | Status: AC

## 2019-12-29 MED ORDER — LIDOCAINE HCL (CARDIAC) PF 100 MG/5ML IV SOSY
PREFILLED_SYRINGE | INTRAVENOUS | Status: DC | PRN
  Administered 2019-12-29: 100 mg via INTRAVENOUS

## 2019-12-29 MED ORDER — ONDANSETRON HCL 4 MG/2ML IJ SOLN
INTRAMUSCULAR | Status: AC
  Filled 2019-12-29: qty 2

## 2019-12-29 MED ORDER — LIDOCAINE HCL (PF) 1 % IJ SOLN
INTRAMUSCULAR | Status: AC
  Filled 2019-12-29: qty 5

## 2019-12-29 MED ORDER — SCOPOLAMINE 1 MG/3DAYS TD PT72
MEDICATED_PATCH | TRANSDERMAL | Status: AC
  Administered 2019-12-29: 1.5 mg via TRANSDERMAL
  Filled 2019-12-29: qty 1

## 2019-12-29 MED ORDER — CEFAZOLIN SODIUM-DEXTROSE 2-4 GM/100ML-% IV SOLN
INTRAVENOUS | Status: AC
  Filled 2019-12-29: qty 100

## 2019-12-29 MED ORDER — OXYCODONE HCL 5 MG PO TABS: 5.0000 mg | ORAL_TABLET | Freq: Once | ORAL | Status: DC | PRN

## 2019-12-29 MED ORDER — FENTANYL CITRATE (PF) 100 MCG/2ML IJ SOLN
INTRAMUSCULAR | Status: DC | PRN
  Administered 2019-12-29: 50 ug via INTRAVENOUS
  Administered 2019-12-29 (×2): 25 ug via INTRAVENOUS

## 2019-12-29 MED ORDER — ARTIFICIAL TEARS OPHTHALMIC OINT
TOPICAL_OINTMENT | OPHTHALMIC | Status: AC
  Filled 2019-12-29: qty 3.5

## 2019-12-29 MED ORDER — PHENYLEPHRINE HCL (PRESSORS) 10 MG/ML IV SOLN
INTRAVENOUS | Status: DC | PRN
  Administered 2019-12-29 (×2): 100 ug via INTRAVENOUS

## 2019-12-29 MED ORDER — GLYCOPYRROLATE 0.2 MG/ML IJ SOLN
INTRAMUSCULAR | Status: AC
  Filled 2019-12-29: qty 1

## 2019-12-29 MED ORDER — ONDANSETRON HCL 4 MG PO TABS: 4.0000 mg | ORAL_TABLET | Freq: Four times a day (QID) | ORAL | Status: DC | PRN

## 2019-12-29 MED ORDER — FENTANYL CITRATE (PF) 100 MCG/2ML IJ SOLN
INTRAMUSCULAR | Status: AC
  Filled 2019-12-29: qty 2

## 2019-12-29 MED ORDER — PROPOFOL 500 MG/50ML IV EMUL
INTRAVENOUS | Status: AC
  Filled 2019-12-29: qty 50

## 2019-12-29 MED ORDER — FENTANYL CITRATE (PF) 100 MCG/2ML IJ SOLN: 50.0000 ug | Freq: Once | INTRAMUSCULAR | Status: AC

## 2019-12-29 MED ORDER — ONDANSETRON HCL 4 MG/2ML IJ SOLN: 4.0000 mg | Freq: Four times a day (QID) | INTRAMUSCULAR | Status: DC | PRN

## 2019-12-29 MED ORDER — EPINEPHRINE PF 1 MG/ML IJ SOLN
INTRAMUSCULAR | Status: AC
  Filled 2019-12-29: qty 4

## 2019-12-29 MED ORDER — SUGAMMADEX SODIUM 200 MG/2ML IV SOLN
INTRAVENOUS | Status: DC | PRN
  Administered 2019-12-29: 200 mg via INTRAVENOUS

## 2019-12-29 MED ORDER — BUPIVACAINE LIPOSOME 1.3 % IJ SUSP
INTRAMUSCULAR | Status: DC | PRN
  Administered 2019-12-29: 20 mL

## 2019-12-29 MED ORDER — BUPIVACAINE HCL (PF) 0.5 % IJ SOLN
INTRAMUSCULAR | Status: AC
  Filled 2019-12-29: qty 10

## 2019-12-29 MED ORDER — SODIUM CHLORIDE (PF) 0.9 % IJ SOLN
INTRAMUSCULAR | Status: AC
  Filled 2019-12-29: qty 10

## 2019-12-29 MED ORDER — HYDROCODONE-ACETAMINOPHEN 7.5-325 MG PO TABS: 1.0000 | ORAL_TABLET | ORAL | Status: DC | PRN

## 2019-12-29 MED ORDER — SODIUM CHLORIDE 0.9 % IV SOLN: INTRAVENOUS | Status: DC

## 2019-12-29 MED ORDER — PROMETHAZINE HCL 25 MG/ML IJ SOLN: 6.2500 mg | Freq: Once | INTRAMUSCULAR | Status: DC

## 2019-12-29 MED ORDER — ONDANSETRON HCL 4 MG/2ML IJ SOLN
4.0000 mg | Freq: Once | INTRAMUSCULAR | Status: AC | PRN
  Administered 2019-12-29: 4 mg via INTRAVENOUS

## 2019-12-29 MED ORDER — SUGAMMADEX SODIUM 200 MG/2ML IV SOLN
INTRAVENOUS | Status: AC
  Filled 2019-12-29: qty 2

## 2019-12-29 MED ORDER — FENTANYL CITRATE (PF) 100 MCG/2ML IJ SOLN: 25.0000 ug | INTRAMUSCULAR | Status: DC | PRN

## 2019-12-29 MED ORDER — SODIUM CHLORIDE 0.9 % IV SOLN
INTRAVENOUS | Status: DC | PRN
  Administered 2019-12-29: 14:00:00 20 ug/min via INTRAVENOUS
  Administered 2019-12-29: 16:00:00 200 ug via INTRAVENOUS

## 2019-12-29 MED ORDER — ACETAMINOPHEN 10 MG/ML IV SOLN: 1000.0000 mg | Freq: Once | INTRAVENOUS | Status: DC | PRN

## 2019-12-29 MED ORDER — ROCURONIUM BROMIDE 100 MG/10ML IV SOLN
INTRAVENOUS | Status: DC | PRN
  Administered 2019-12-29: 30 mg via INTRAVENOUS

## 2019-12-29 MED ORDER — BUPIVACAINE HCL (PF) 0.5 % IJ SOLN
INTRAMUSCULAR | Status: DC | PRN
  Administered 2019-12-29: 10 mL

## 2019-12-29 MED ORDER — CHLORHEXIDINE GLUCONATE 4 % EX LIQD: 60.0000 mL | Freq: Once | CUTANEOUS | Status: DC

## 2019-12-29 MED ORDER — KETOROLAC TROMETHAMINE 15 MG/ML IJ SOLN: 7.5000 mg | Freq: Four times a day (QID) | INTRAMUSCULAR | Status: DC

## 2019-12-29 MED ORDER — FENTANYL CITRATE (PF) 100 MCG/2ML IJ SOLN
INTRAMUSCULAR | Status: AC
  Administered 2019-12-29: 50 ug via INTRAVENOUS
  Filled 2019-12-29: qty 2

## 2019-12-29 MED ORDER — PROPOFOL 500 MG/50ML IV EMUL
INTRAVENOUS | Status: DC | PRN
  Administered 2019-12-29: 20 ug/kg/min via INTRAVENOUS

## 2019-12-29 MED ORDER — SUCCINYLCHOLINE CHLORIDE 20 MG/ML IJ SOLN
INTRAMUSCULAR | Status: DC | PRN
  Administered 2019-12-29: 100 mg via INTRAVENOUS

## 2019-12-29 MED ORDER — ROCURONIUM BROMIDE 50 MG/5ML IV SOLN
INTRAVENOUS | Status: AC
  Filled 2019-12-29: qty 1

## 2019-12-29 MED ORDER — EPINEPHRINE PF 1 MG/ML IJ SOLN
INTRAMUSCULAR | Status: AC
  Filled 2019-12-29: qty 1

## 2019-12-29 MED ORDER — OXYCODONE HCL 5 MG/5ML PO SOLN: 5.0000 mg | Freq: Once | ORAL | Status: DC | PRN

## 2019-12-29 MED ORDER — PROPOFOL 10 MG/ML IV BOLUS
INTRAVENOUS | Status: DC | PRN
  Administered 2019-12-29: 140 mg via INTRAVENOUS

## 2019-12-29 MED ORDER — DEXAMETHASONE SODIUM PHOSPHATE 10 MG/ML IJ SOLN
INTRAMUSCULAR | Status: DC | PRN
  Administered 2019-12-29: 8 mg via INTRAVENOUS

## 2019-12-29 MED ORDER — MORPHINE SULFATE (PF) 4 MG/ML IV SOLN: 0.5000 mg | INTRAVENOUS | Status: DC | PRN

## 2019-12-29 MED ORDER — HYDROCODONE-ACETAMINOPHEN 5-325 MG PO TABS: 1.0000 | ORAL_TABLET | ORAL | Status: DC | PRN

## 2019-12-29 MED ORDER — METOCLOPRAMIDE HCL 10 MG PO TABS: 5.0000 mg | ORAL_TABLET | Freq: Three times a day (TID) | ORAL | Status: DC | PRN

## 2019-12-29 MED ORDER — MIDAZOLAM HCL 2 MG/2ML IJ SOLN
INTRAMUSCULAR | Status: DC | PRN
  Administered 2019-12-29: 2 mg via INTRAVENOUS

## 2019-12-29 MED ORDER — GLYCOPYRROLATE 0.2 MG/ML IJ SOLN
INTRAMUSCULAR | Status: DC | PRN
  Administered 2019-12-29: .2 mg via INTRAVENOUS

## 2019-12-29 MED ORDER — GABAPENTIN 300 MG PO CAPS: 300.0000 mg | ORAL_CAPSULE | Freq: Three times a day (TID) | ORAL | Status: DC

## 2019-12-29 MED ORDER — ONDANSETRON HCL 4 MG/2ML IJ SOLN
INTRAMUSCULAR | Status: DC | PRN
  Administered 2019-12-29: 4 mg via INTRAVENOUS

## 2019-12-29 SURGICAL SUPPLY — 75 items
4.0mm short burr sheath ×1 IMPLANT
ADAPTER IRRIG TUBE 2 SPIKE SOL (ADAPTER) ×4 IMPLANT
ADPR TBG 2 SPK PMP STRL ASCP (ADAPTER) ×2
ANCH SUT 2 4.75 2 THRD PEEK (Anchor) ×2 IMPLANT
ANCH SUT 2 TPE SLF PNCH BLU (Anchor) ×2 IMPLANT
ANCHOR SUT CROSSFT 4.75 (Anchor) ×2 IMPLANT
ANCHOR YKNOT PRO RC BLUE TAPE (Anchor) ×2 IMPLANT
APL PRP STRL LF DISP 70% ISPRP (MISCELLANEOUS) ×1
BLADE FULL RADIUS 3.5 (BLADE) ×2 IMPLANT
BLADE INCISOR PLUS 4.5 (BLADE) ×2 IMPLANT
BLADE SURG MINI STRL (BLADE) ×2 IMPLANT
BRUSH SCRUB EZ  4% CHG (MISCELLANEOUS) ×1
BRUSH SCRUB EZ 4% CHG (MISCELLANEOUS) ×1 IMPLANT
BUR ACROMIONIZER 4.0 (BURR) ×1 IMPLANT
BUR BR 5.5 WIDE MOUTH (BURR) ×1 IMPLANT
CANNULA 5.75X7 CRYSTAL CLEAR (CANNULA) ×1 IMPLANT
CANNULA PARTIAL THREAD 2X7 (CANNULA) IMPLANT
CANNULA SHOULDER 7CM (CANNULA) ×1 IMPLANT
CANNULA TWIST IN 8.25X7CM (CANNULA) ×1 IMPLANT
CANNULA TWIST IN 8.25X9CM (CANNULA) ×2 IMPLANT
CHLORAPREP W/TINT 26 (MISCELLANEOUS) ×2 IMPLANT
COOLER POLAR GLACIER W/PUMP (MISCELLANEOUS) ×2 IMPLANT
COVER WAND RF STERILE (DRAPES) ×2 IMPLANT
CRADLE LAMINECT ARM (MISCELLANEOUS) ×2 IMPLANT
DEVICE SUCT BLK HOLE OR FLOOR (MISCELLANEOUS) ×1 IMPLANT
DRAPE 3/4 80X56 (DRAPES) ×2 IMPLANT
DRAPE INCISE IOBAN 66X45 STRL (DRAPES) ×2 IMPLANT
DRAPE STERI 35X30 U-POUCH (DRAPES) ×2 IMPLANT
DRAPE U-SHAPE 47X51 STRL (DRAPES) ×3 IMPLANT
ELECT REM PT RETURN 9FT ADLT (ELECTROSURGICAL) ×2
ELECTRODE REM PT RTRN 9FT ADLT (ELECTROSURGICAL) ×1 IMPLANT
GAUZE 4X4 16PLY RFD (DISPOSABLE) IMPLANT
GAUZE SPONGE 4X4 12PLY STRL (GAUZE/BANDAGES/DRESSINGS) ×2 IMPLANT
GAUZE XEROFORM 1X8 LF (GAUZE/BANDAGES/DRESSINGS) ×2 IMPLANT
GLOVE BIOGEL PI IND STRL 8 (GLOVE) ×1 IMPLANT
GLOVE BIOGEL PI INDICATOR 8 (GLOVE) ×1
GLOVE SURG ORTHO 8.0 STRL STRW (GLOVE) ×2 IMPLANT
GOWN STRL REUS W/ TWL LRG LVL3 (GOWN DISPOSABLE) ×1 IMPLANT
GOWN STRL REUS W/ TWL XL LVL3 (GOWN DISPOSABLE) ×1 IMPLANT
GOWN STRL REUS W/TWL LRG LVL3 (GOWN DISPOSABLE) ×2
GOWN STRL REUS W/TWL XL LVL3 (GOWN DISPOSABLE) ×2
IV LACTATED RINGER IRRG 3000ML (IV SOLUTION) ×10
IV LR IRRIG 3000ML ARTHROMATIC (IV SOLUTION) ×6 IMPLANT
KIT STABILIZATION SHOULDER (MISCELLANEOUS) ×2 IMPLANT
KIT TURNOVER KIT A (KITS) ×2 IMPLANT
MANIFOLD NEPTUNE II (INSTRUMENTS) ×4 IMPLANT
MASK FACE SPIDER DISP (MASK) ×2 IMPLANT
MAT ABSORB  FLUID 56X50 GRAY (MISCELLANEOUS) ×2
MAT ABSORB FLUID 56X50 GRAY (MISCELLANEOUS) ×1 IMPLANT
NDL SAFETY ECLIPSE 18X1.5 (NEEDLE) ×1 IMPLANT
NDL SCORPION MULTI FIRE (NEEDLE) IMPLANT
NDL SPNL 18GX3.5 QUINCKE PK (NEEDLE) ×1 IMPLANT
NEEDLE HYPO 18GX1.5 SHARP (NEEDLE) ×2
NEEDLE HYPO 22GX1.5 SAFETY (NEEDLE) ×2 IMPLANT
NEEDLE SCORPION MULTI FIRE (NEEDLE) ×2 IMPLANT
NEEDLE SPNL 18GX3.5 QUINCKE PK (NEEDLE) ×2 IMPLANT
PACK ARTHROSCOPY SHOULDER (MISCELLANEOUS) ×2 IMPLANT
PAD ABD DERMACEA PRESS 5X9 (GAUZE/BANDAGES/DRESSINGS) IMPLANT
PAD WRAPON POLAR SHDR XLG (MISCELLANEOUS) ×1 IMPLANT
SLING ARM LRG DEEP (SOFTGOODS) IMPLANT
SLING ULTRA II M (MISCELLANEOUS) ×1 IMPLANT
STRAP SAFETY 5IN WIDE (MISCELLANEOUS) ×3 IMPLANT
STRIP CLOSURE SKIN 1/4X4 (GAUZE/BANDAGES/DRESSINGS) IMPLANT
SUT ETHILON NAB PS2 4-0 18IN (SUTURE) ×2 IMPLANT
SUT FIBERWIRE #2 38 T-5 BLUE (SUTURE)
SUT PDS AB 0 CT1 27 (SUTURE) ×2 IMPLANT
SUT TIGER TAPE 7 IN WHITE (SUTURE) IMPLANT
SUTURE FIBERWR #2 38 T-5 BLUE (SUTURE) IMPLANT
SYR 10ML LL (SYRINGE) ×2 IMPLANT
SYR 50ML LL SCALE MARK (SYRINGE) ×2 IMPLANT
TAPE MICROFOAM 4IN (TAPE) ×2 IMPLANT
TUBING ARTHRO INFLOW-ONLY STRL (TUBING) ×2 IMPLANT
TUBING CONNECTING 10 (TUBING) ×2 IMPLANT
WAND WEREWOLF FLOW 90D (MISCELLANEOUS) ×2 IMPLANT
WRAPON POLAR PAD SHDR XLG (MISCELLANEOUS) ×2

## 2019-12-29 NOTE — Anesthesia Postprocedure Evaluation (Signed)
Anesthesia Post Note  Patient: Shannon [name]  Procedure(s) Performed: LEFT SHOULDER ARTHROSCOPIC DISTAL CLAVICLE EXCISISON, SUBACROMIAL DECOMPRESSION,BICEPS TENOTOMY,ROTATOR CUFF REPAIR  (Left Shoulder)  Patient location during evaluation: PACU Anesthesia Type: General Level of consciousness: awake and alert Pain management: pain level controlled Vital Signs Assessment: post-procedure vital signs reviewed and stable Respiratory status: spontaneous breathing, nonlabored ventilation, respiratory function stable and patient connected to nasal cannula oxygen Cardiovascular status: blood pressure returned to baseline and stable Postop Assessment: no apparent nausea or vomiting Anesthetic complications: no     Last Vitals:  Vitals:   12/29/19 1713 12/29/19 1724  BP: 132/77 133/68  Pulse: 92 93  Resp: 16 18  Temp: (!) 36.4 C (!) 36.3 C  SpO2: 99% 97%    Last Pain:  Vitals:   12/29/19 1724  TempSrc: Temporal  PainSc: 4                  Precious Haws Vander Kueker

## 2019-12-29 NOTE — H&P (Signed)
The patient has been re-examined, and the chart reviewed, and there have been no interval changes to the documented history and physical.  Plan a left shoulder arthroscopy with rotator cuff repair today.  Anesthesia is consulted regarding a peripheral nerve block for post-operative pain.  The risks, benefits, and alternatives have been discussed at length, and the patient is willing to proceed.

## 2019-12-29 NOTE — Anesthesia Procedure Notes (Signed)
Anesthesia Regional Block: Interscalene brachial plexus block   Pre-Anesthetic Checklist: ,, timeout performed, Correct Patient, Correct Site, Correct Laterality, Correct Procedure, Correct Position, site marked, Risks and benefits discussed,  Surgical consent,  Pre-op evaluation,  At surgeon's request and post-op pain management  Laterality: Left  Prep: chloraprep       Needles:  Injection technique: Single-shot  Needle Type: Echogenic Needle     Needle Length: 4cm  Needle Gauge: 25     Additional Needles:   Narrative:  Start time: 12/29/2019 12:15 PM End time: 12/29/2019 12:24 PM Injection made incrementally with aspirations every 5 mL.  Performed by: Personally  Anesthesiologist: Arita Miss, MD  Additional Notes: Patient consented for risk and benefits of nerve block including but not limited to: nerve damage, failed block, bleeding and infection, hemidiaphragmatic paralysis and shortness of breath, Horner's syndrome.  Patient voiced understanding.  Functioning IV was confirmed and monitors were applied.  Sterile prep,hand hygiene and sterile gloves were used.  Minimal sedation used for procedure.   No paresthesia endorsed by patient during the procedure.  Negative aspiration and negative test dose prior to incremental administration of local anesthetic. The patient tolerated the procedure well with no immediate complications.

## 2019-12-29 NOTE — Discharge Instructions (Addendum)
Wear sling at all times, including sleep.  You will need to use the sling for a total of 4 weeks following surgery.  Do not try and lift your arm up or away from your body for any reason.   Keep the dressing dry.  You may remove bandage in 3 days.  You may place Band-Aids over top of the incisions.  May shower once dressing is removed in 3 days.  Remove sling carefully only for showers, leaving arm down by your side while in the shower.  +++ Make sure to take some pain medication this evening before you fall asleep, in preparation for the nerve block wearing off in the middle of the night.  If the the pain medication causes itching, or is too strong, try taking a single tablet at a time, or combining with Benadryl.  You may be most comfortable sleeping in a recliner.  If you do sleep in near bed, placed pillows behind the shoulder that have the operation to support it.    General Anesthesia, Adult, Care After This sheet gives you information about how to care for yourself after your procedure. Your health care provider may also give you more specific instructions. If you have problems or questions, contact your health care provider. What can I expect after the procedure? After the procedure, the following side effects are common:  Pain or discomfort at the IV site.  Nausea.  Vomiting.  Sore throat.  Trouble concentrating.  Feeling cold or chills.  Weak or tired.  Sleepiness and fatigue.  Soreness and body aches. These side effects can affect parts of the body that were not involved in surgery. Follow these instructions at home:  For at least 24 hours after the procedure:  Have a responsible adult stay with you. It is important to have someone help care for you until you are awake and alert.  Rest as needed.  Do not: ? Participate in activities in which you could fall or become injured. ? Drive. ? Use heavy machinery. ? Drink alcohol. ? Take sleeping pills or  medicines that cause drowsiness. ? Make important decisions or sign legal documents. ? Take care of children on your own. Eating and drinking  Follow any instructions from your health care provider about eating or drinking restrictions.  When you feel hungry, start by eating small amounts of foods that are soft and easy to digest (bland), such as toast. Gradually return to your regular diet.  Drink enough fluid to keep your urine pale yellow.  If you vomit, rehydrate by drinking water, juice, or clear broth. General instructions  If you have sleep apnea, surgery and certain medicines can increase your risk for breathing problems. Follow instructions from your health care provider about wearing your sleep device: ? Anytime you are sleeping, including during daytime naps. ? While taking prescription pain medicines, sleeping medicines, or medicines that make you drowsy.  Return to your normal activities as told by your health care provider. Ask your health care provider what activities are safe for you.  Take over-the-counter and prescription medicines only as told by your health care provider.  If you smoke, do not smoke without supervision.  Keep all follow-up visits as told by your health care provider. This is important. Contact a health care provider if:  You have nausea or vomiting that does not get better with medicine.  You cannot eat or drink without vomiting.  You have pain that does not get better with medicine.  You are unable to pass urine.  You develop a skin rash.  You have a fever.  You have redness around your IV site that gets worse. Get help right away if:  You have difficulty breathing.  You have chest pain.  You have blood in your urine or stool, or you vomit blood. Summary  After the procedure, it is common to have a sore throat or nausea. It is also common to feel tired.  Have a responsible adult stay with you for the first 24 hours after general  anesthesia. It is important to have someone help care for you until you are awake and alert.  When you feel hungry, start by eating small amounts of foods that are soft and easy to digest (bland), such as toast. Gradually return to your regular diet.  Drink enough fluid to keep your urine pale yellow.  Return to your normal activities as told by your health care provider. Ask your health care provider what activities are safe for you. This information is not intended to replace advice given to you by your health care provider. Make sure you discuss any questions you have with your health care provider. Document Revised: 11/09/2017 Document Reviewed: 06/22/2017 Elsevier Patient Education  2020 [employer]   1) The drugs that you were given will stay in your system until tomorrow so for the next 24 hours you should not:  A) Drive an automobile B) Make any legal decisions C) Drink any alcoholic beverage   2) You may resume regular meals tomorrow.  Today it is better to start with liquids and gradually work up to solid foods.  You may eat anything you prefer, but it is better to start with liquids, then soup and crackers, and gradually work up to solid foods.   3) Please notify your doctor immediately if you have any unusual bleeding, trouble breathing, redness and pain at the surgery site, drainage, fever, or pain not relieved by medication.    4) Additional Instructions:        Please contact your physician with any problems or Same Day Surgery at [phone], Monday through Friday 6 am to 4 pm, or [place] at [facility] number at [phone].     Interscalene Nerve Block, Care After This sheet gives you information about how to care for yourself after your procedure. Your health care provider may also give you more specific instructions. If you have problems or questions, contact your health care provider. What can I  expect after the procedure? After the procedure, it is common to have:  Soreness or tenderness in your neck.  Numbness in your shoulder, upper arm, and some fingers.  Weakness in your shoulder and arm muscles. The feeling and strength in your shoulder, arm, and fingers should return to normal within hours after your procedure. Follow these instructions at home: For at least 24 hours after the procedure:  Do not: ? Participate in activities in which you could fall or become injured. ? Drive. ? Use heavy machinery. ? Drink alcohol. ? Take sleeping pills or medicines that cause drowsiness. ? Make important decisions or sign legal documents. ? Take care of children on your own.  Rest. Eating and drinking  If you vomit, drink water, juice, or soup when you can drink without vomiting.  Make sure you have little or no nausea before eating solid foods.  Follow the diet that is recommended by your health care provider. If  you have a sling:  Wear it as told by your health care provider. Remove it only as told by your health care provider.  Loosen the sling if your fingers tingle, become numb, or turn cold and blue.  Make sure that your entire arm, including your wrist, is supported. Do not allow your wrist to dangle over the end of the sling.  Do not let your sling get wet if it is not waterproof.  Keep the sling clean. Bathing  Do not take baths, swim, or use a hot tub until your health care provider approves.  If you have a nerve block catheter in place, keep the incision site and tubing dry. Injection site care   Wash your hands with soap and water before you change your bandage (dressing). If soap and water are not available, use hand sanitizer.  Change your dressing as told by your health care provider.  Keep your dressing dry.  Check your nerve block injection site every day for signs of infection. Check for: ? Redness, swelling, or pain. ? Fluid or  blood. ? Warmth. Activity  Do not perform complex or risky activities while taking prescription pain medicine and until you have fully recovered.  Return to your normal activities as told by your health care provider and as you can tolerate them. Ask your health care provider what activities are safe for you.  Rest and take it easy. This will help you heal and recover more quickly and fully.  Be very cautious until you have regained strength and sensation. General instructions  Have a responsible adult stay with you until you are awake and alert.  Do not drive or use heavy machinery while taking prescription pain medicine and until you have fully recovered. Ask your health care provider when it is safe to drive.  Take over-the-counter and prescription medicines only as told by your health care provider.  If you smoke, do not smoke without supervision.  Do not expose your arm or shoulder to very cold or very hot temperatures until you have full feeling back.  If you have a nerve block catheter in place: ? Try to keep the catheter from getting kinked or pinched. ? Avoid pulling or tugging on the catheter.  Keep all follow-up visits as told by your health care provider. This is important. Contact a health care provider if:  You have chills or fever.  You have redness, swelling, or pain around your injection site.  You have fluid or blood coming from the injection site.  The skin around the injection site is warm to the touch.  There is a bad smell coming from your dressing.  You have hoarseness or a drooping or dry eye that lasts more than a few days.  You have pain that is poorly controlled with the block or with pain medicine.  You have numbness, tingling, or weakness in your shoulder or arm that lasts for more than one week. Get help right away if:  You have severe pain.  You lose or do not regain strength and sensation in your arm even after the nerve block medicine  has stopped.  You have trouble breathing.  You have a nerve block catheter still in place and you begin to shiver.  You have a nerve block catheter still in place and you are getting more and more numb or weak. This information is not intended to replace advice given to you by your health care provider. Make sure you discuss any  questions you have with your health care provider. Document Revised: 11/09/2017 Document Reviewed: 07/07/2016 Elsevier Patient Education  2020 [employer].

## 2019-12-29 NOTE — Op Note (Signed)
12/29/2019  3:21 PM  PATIENT:  Shannon [name]  64 y.o. female  PRE-OPERATIVE DIAGNOSIS:  M75.122 complete rotator cuff tear/rupture of left shoulder  POST-OPERATIVE DIAGNOSIS:  M75.122 complete rotator cuff tear/rupture of left shoulder  PROCEDURE:  Procedure(s): SHOULDER ARTHROSCOPY WITH OPEN ROTATOR CUFF REPAIR (Left), SUBACROMIAL DECOMPRESSION, DISTAL CLAVICLE EXCISION AND INTRA-ARTICULAR DEBRIDEMENT WITH BICEPS TENOTOMY  SURGEON:  Surgeon(s) and Role:    * Lovell Sheehan, MD - Primary  ASSIST: Carlynn Spry, PA-C  ANESTHESIA:   regional and general   PREOPERATIVE INDICATIONS:  Shannon [name] is a  64 y.o. female with a diagnosis of M75.122 complete rotator cuff tear/rupture of left shoulder who failed conservative measures and elected for surgical management.    The risks benefits and alternatives were discussed with the patient preoperatively including but not limited to the risks of infection, bleeding, nerve injury, persistent pain or weakness, failure of the hardware, re-tear of the rotator cuff and the need for further surgery. Medical risks include DVT and pulmonary embolism, myocardial infarction, stroke, pneumonia, respiratory failure and death. Patient understood these risks and wished to proceed.  OPERATIVE IMPLANTS: [employer] SpeedBridge System  OPERATIVE PROCEDURE: The patient was met in the preoperative area. The left shoulder was signed with my initials according the hospital's correct site of surgery protocol. The patient is brought to the OR and underwent a supraclavicular block and general endotracheal intubation by the anesthesia service.  The patient was placed in a beachchair position.  A spider arm positioner was used for this case. Examination under anesthesia revealed a negative sulcus sign. There was no anterior/posterior instability. There was a full range of motion.  The patient was prepped and draped in a sterile fashion. A timeout was performed to verify  the patient's name, date of birth, medical record number, correct site of surgery and correct procedure to be performed there was also used to verify the patient received antibiotics that all appropriate instruments, implants and radiographs studies were available in the room. Once all in attendance were in agreement case began.  Bony landmarks were drawn out with a surgical marker along with proposed arthroscopy incisions. These were pre-injected with 1% lidocaine plain. An 11 blade was used to establish a posterior portal through which the arthroscope was placed in the glenohumeral joint. A full diagnostic examination of the shoulder was performed.  The anterior portal was established under direct visualization with an 18-gauge spinal needle.  A 5.75 mm arthroscopic cannula was placed through the anterior portal.   The intra-articular portion of the biceps tendon was found to have a partial tear involving greater than 50% of the diameter. Therefore the decision was made to perform a tenotomy. An arthroscopic wand was used to release the biceps tendon off the superior labrum. The arthroscopic shaver was then used to debride the frayed edges of the labrum. There were no anterior or superior labral tears seen.  Subscapularis tendon was intact. Patient had a full-thickness tear involving the supraspinatus and infraspinatus with retraction. There were no loose bodies within the inferior recess and no evidence of HAGL lesion.  The arthroscope was then placed in the subacromial space. A lateral portal was then established using an 18-gauge spinal needle for localization.   The greater tuberosity was debrided using a 5.5 mm resector shaver blade to remove all remaining foreign fibers of the rotator cuff.  Debridement was performed until punctate bleeding was seen at the greater tuberosity footprint, which will allow for rotator cuff healing.  Extensive bursitis was encountered and debrided using a 4-0 resector  shaver blade and a 90 ArthroCare wand from the lateral portal. Using the SutureBridge system medial anchors with fiber tape were placed. The cuff was mobilized and the tape passed through the rotator cuff. The tape was then crossed in usual fashion and fixated on the lateral side with two crossFT 4.75 mm anchors. The final construct was stable and moved as a unit with excellent coverage of the humeral head.  Using the arthroscopic burr a subacromial decompression was performed. The burr was then used from the anterior portal to removal 8 mm of distal clavicle.  All incisions were copiously irrigated. Skin closure for the arthroscopic incisions was performed with 4-0 nylon.  A dry sterile dressing including Steri-Strips was applied .  The patient was placed in an abduction sling.  All sharp and instrument counts were correct at the conclusion of the case. I was scrubbed and present for the entire case. I spoke with the patient's family in the post-op consultation room and informed them that the case had been performed without complication and the patient was stable in recovery room.   Kurtis Bushman, MD

## 2019-12-29 NOTE — Anesthesia Preprocedure Evaluation (Signed)
Anesthesia Evaluation  Patient identified by MRN, date of birth, ID band Patient awake  General Assessment Comment:Prior grade 3 view with mcgrath 3 blade, 3 attempts. Easy mask ventilation per chart.  Reviewed: Allergy & Precautions, NPO status , Patient's Chart, lab work & pertinent test results  History of Anesthesia Complications (+) PONV, DIFFICULT AIRWAY and history of anesthetic complications  Airway Mallampati: II  TM Distance: >3 FB Neck ROM: Full    Dental no notable dental hx. (+) Teeth Intact, Dental Advisory Given   Pulmonary neg pulmonary ROS, neg sleep apnea, neg COPD, Patient abstained from smoking.Not current smoker,  Childhood asthma, no longer on inhalers    Pulmonary exam normal breath sounds clear to auscultation       Cardiovascular Exercise Tolerance: Good METShypertension, (-) CAD and (-) Past MI (-) dysrhythmias  Rhythm:Regular Rate:Normal - Systolic murmurs    Neuro/Psych PSYCHIATRIC DISORDERS Depression negative neurological ROS     GI/Hepatic GERD  Controlled,(+)     (-) substance abuse  ,   Endo/Other  diabetes, Insulin Dependent  Renal/GU negative Renal ROS     Musculoskeletal   Abdominal   Peds  Hematology   Anesthesia Other Findings Past Medical History: No date: Allergy No date: Arthritis     Comment:  hip, back, shoulders, joints No date: Asthma No date: Complication of anesthesia     Comment:  problems with block No date: Depression 11/12: Diabetes mellitus     Comment:  type 2 No date: Family history of adverse reaction to anesthesia     Comment:  PROBLEMS WITH BLOCKS No date: Fatty liver No date: GERD (gastroesophageal reflux disease) No date: Glaucoma suspect No date: Headache     Comment:  migraines-4x a week headache-1x week migraine No date: Hyperlipidemia No date: Hypertension     Comment:  Controlled on meds No date: Mitral valve prolapse No date:  Osteopenia No date: PONV (postoperative nausea and vomiting) 11/2006: Shingles No date: Sleep disturbance No date: Tachycardia No date: Vertigo     Comment:  Hx of  Reproductive/Obstetrics                             Anesthesia Physical Anesthesia Plan  ASA: II  Anesthesia Plan: General   Post-op Pain Management:  Regional for Post-op pain   Induction: Intravenous  PONV Risk Score and Plan: 4 or greater and Ondansetron, Dexamethasone and Midazolam  Airway Management Planned: Oral ETT and Fiberoptic Intubation Planned  Additional Equipment: None  Intra-op Plan:   Post-operative Plan: Extubation in OR  Informed Consent: I have reviewed the patients History and Physical, chart, labs and discussed the procedure including the risks, benefits and alternatives for the proposed anesthesia with the patient or authorized representative who has indicated his/her understanding and acceptance.     Dental advisory given  Plan Discussed with: CRNA and Surgeon  Anesthesia Plan Comments: (Discussed risks of anesthesia with patient, including PONV, sore throat, lip/dental damage. Rare risks discussed as well, such as cardiorespiratory sequelae. Patient understands.)        Anesthesia Quick Evaluation

## 2019-12-29 NOTE — Anesthesia Procedure Notes (Signed)
Procedure Name: Intubation Date/Time: 12/29/2019 1:17 PM Performed by: Hedda Slade, CRNA Pre-anesthesia Checklist: Patient identified, Patient being monitored, Timeout performed, Emergency Drugs available and Suction available Patient Re-evaluated:Patient Re-evaluated prior to induction Oxygen Delivery Method: Circle system utilized Preoxygenation: Pre-oxygenation with 100% oxygen Induction Type: IV induction Ventilation: Mask ventilation without difficulty and Oral airway inserted - appropriate to patient size Laryngoscope Size: 3 and McGraph Grade View: Grade I Tube type: Oral Tube size: 7.0 mm Number of attempts: 1 Airway Equipment and Method: Stylet Placement Confirmation: ETT inserted through vocal cords under direct vision,  positive ETCO2 and breath sounds checked- equal and bilateral Secured at: 21 cm Tube secured with: Tape Dental Injury: Teeth and Oropharynx as per pre-operative assessment

## 2019-12-29 NOTE — Transfer of Care (Signed)
Immediate Anesthesia Transfer of Care Note  Patient: Shannon [name]  Procedure(s) Performed: SHOULDER ARTHROSCOPY WITH OPEN ROTATOR CUFF REPAIR (Left Shoulder)  Patient Location: PACU  Anesthesia Type:General  Level of Consciousness: sedated  Airway & Oxygen Therapy: Patient Spontanous Breathing and Patient connected to face mask oxygen  Post-op Assessment: Report given to RN and Post -op Vital signs reviewed and stable  Post vital signs: Reviewed and stable  Last Vitals:  Vitals Value Taken Time  BP 125/70 12/29/19 1543  Temp 35.9 C 12/29/19 1543  Pulse 85 12/29/19 1545  Resp 11 12/29/19 1545  SpO2 100 % 12/29/19 1545  Vitals shown include unvalidated device data.  Last Pain:  Vitals:   12/29/19 1254  TempSrc:   PainSc: 0-No pain         Complications: No apparent anesthesia complications

## 2020-01-07 ENCOUNTER — Encounter: Payer: 59 | Admitting: Internal Medicine

## 2020-01-09 DIAGNOSIS — Z9889 Other specified postprocedural states: Secondary | ICD-10-CM | POA: Insufficient documentation

## 2020-01-15 LAB — HM DIABETES EYE EXAM

## 2020-01-20 ENCOUNTER — Ambulatory Visit: Payer: 59 | Admitting: Gastroenterology

## 2020-01-25 ENCOUNTER — Other Ambulatory Visit: Payer: Self-pay | Admitting: Internal Medicine

## 2020-02-26 ENCOUNTER — Ambulatory Visit: Payer: 59 | Admitting: Internal Medicine

## 2020-02-26 NOTE — Progress Notes (Deleted)
Follow-up Outpatient Visit Date: 02/26/2020  Primary Care Provider: Venia Carbon, MD [place] [address]  Chief Complaint: ***  HPI:  Ms. Shannon [name] is a 64 y.o. female with history of hypertension, hyperlipidemia, diabetes mellitus, mitral valve prolapse, and GERD, who presents for follow-up of chest pain.  I last saw her in 04/2019, at which time she reported minimal chest pain.  She was most concerned about bilateral thigh and calf pain, usually present at the Darlynn Ricco of the day or at night.  Timing and quality of pain were not consistent with PAD.  Pedal pulses were also normal.  Escalation of statin therapy was recommended but deferred to her PCP.  --------------------------------------------------------------------------------------------------  Past Medical History:  Diagnosis Date  . Allergy   . Arthritis    hip, back, shoulders, joints  . Asthma   . Complication of anesthesia    problems with block  . Depression   . Diabetes mellitus 11/12   type 2  . Family history of adverse reaction to anesthesia    PROBLEMS WITH BLOCKS  . Fatty liver   . GERD (gastroesophageal reflux disease)   . Glaucoma suspect   . Headache    migraines-4x a week headache-1x week migraine  . Hyperlipidemia   . Hypertension    Controlled on meds  . Mitral valve prolapse   . Osteopenia   . PONV (postoperative nausea and vomiting)   . Shingles 11/2006  . Sleep disturbance   . Tachycardia   . Vertigo    Hx of   Past Surgical History:  Procedure Laterality Date  . ABDOMINAL HYSTERECTOMY  2000   /BSO - abn. [facility]  . APPENDECTOMY  1978  . AUGMENTATION MAMMAPLASTY Bilateral 1981  . BLADDER REPAIR     x2  . BREAST ENHANCEMENT SURGERY  1987  . [place] Left 12/18/2016   Procedure: Left Heart Cath and Coronary Angiography;  Surgeon: Shannon Hampshire, MD;  Location: [facility];  Service: Cardiovascular;   Laterality: Left;  . CARPAL TUNNEL RELEASE Right   . CERVICAL DISCECTOMY  11/2009   with synthetic discs inserted--  Dr Shannon [name]  . NM MYOVIEW LTD  03/2005   EF 56%, no ischemia  . RECTAL PROLAPSE REPAIR     x1  . SHOULDER ARTHROSCOPY WITH OPEN ROTATOR CUFF REPAIR Left 12/29/2019   Procedure: LEFT SHOULDER ARTHROSCOPIC DISTAL CLAVICLE EXCISISON, SUBACROMIAL DECOMPRESSION,BICEPS [place] ;  Surgeon: Shannon Sheehan, MD;  Location: [facility];  Service: Orthopedics;  Laterality: Left;  . SHOULDER SURGERY  09/2008, 01/2009   Right shoulder surgery, then redone with lysis of adhesions  . TOTAL HIP ARTHROPLASTY Right 05/29/2018   Procedure: TOTAL HIP ARTHROPLASTY ANTERIOR APPROACH;  Surgeon: Shannon Sheehan, MD;  Location: [facility];  Service: Orthopedics;  Laterality: Right;    No outpatient medications have been marked as taking for the 02/26/20 encounter (Appointment) with Shannon [name], Shannon Gave, MD.    Allergies: Lisinopril, Nitrofurantoin, Oxycodone-acetaminophen, Atorvastatin, Clarithromycin, Glipizide, and Invokana [canagliflozin]  Social History   Tobacco Use  . Smoking status: Never Smoker  . Smokeless tobacco: Never Used  Substance Use Topics  . Alcohol use: No  . Drug use: No    Family History  Problem Relation Age of Onset  . Diabetes Mother   . Heart disease Mother   . Hypertension Mother   . Ovarian cancer Mother   . Melanoma Mother   .  Cancer Father   . Coronary artery disease Father   . Heart disease Father   . Diabetes Sister   . Depression Sister   . Coronary artery disease Sister   . Heart disease Sister   . Rheum arthritis Maternal Aunt   . Breast cancer Maternal Grandmother   . Diabetes Maternal Grandmother   . Heart attack Paternal Uncle   . Heart attack Cousin   . Heart attack Paternal Uncle   . Diabetes Maternal Grandfather   . Colon cancer Neg Hx     Review of Systems: A 12-system review of systems was performed and was negative except  as noted in the HPI.  --------------------------------------------------------------------------------------------------  Physical Exam: There were no vitals taken for this visit.  General:  *** HEENT: No conjunctival pallor or scleral icterus. Facemask in place. Neck: Supple without lymphadenopathy, thyromegaly, JVD, or HJR. Lungs: Normal work of breathing. Clear to auscultation bilaterally without wheezes or crackles. Heart: Regular rate and rhythm without murmurs, rubs, or gallops. Non-displaced PMI. Abd: Bowel sounds present. Soft, NT/ND without hepatosplenomegaly Ext: No lower extremity edema. Radial, PT, and DP pulses are 2+ bilaterally. Skin: Warm and dry without rash.  EKG:  ***  Lab Results  Component Value Date   WBC 9.7 12/22/2019   HGB 13.0 12/22/2019   HCT 39.4 12/22/2019   MCV 84.2 12/22/2019   PLT 316.0 12/22/2019    Lab Results  Component Value Date   NA 141 12/22/2019   K 4.6 12/22/2019   CL 101 12/22/2019   CO2 26 12/22/2019   BUN 16 12/22/2019   CREATININE 1.02 12/22/2019   GLUCOSE 176 (H) 12/22/2019   ALT 29 12/22/2019    Lab Results  Component Value Date   CHOL 180 12/22/2019   HDL 48.70 12/22/2019   LDLCALC 126 (H) 12/14/2016   LDLDIRECT 105.0 12/22/2019   TRIG 335.0 (H) 12/22/2019   CHOLHDL 4 12/22/2019    --------------------------------------------------------------------------------------------------  ASSESSMENT AND PLAN: Shannon Bush, MD 02/26/2020 7:33 AM

## 2020-02-27 ENCOUNTER — Encounter: Payer: Self-pay | Admitting: Nurse Practitioner

## 2020-02-27 ENCOUNTER — Ambulatory Visit
Admission: EM | Admit: 2020-02-27 | Discharge: 2020-02-27 | Disposition: A | Discharge status: Home / Self Care | Payer: 59 | Attending: Emergency Medicine | Admitting: Emergency Medicine

## 2020-02-27 ENCOUNTER — Other Ambulatory Visit: Payer: Self-pay

## 2020-02-27 ENCOUNTER — Telehealth (HOSPITAL_COMMUNITY): Payer: Self-pay | Admitting: Nurse Practitioner

## 2020-02-27 ENCOUNTER — Other Ambulatory Visit (HOSPITAL_COMMUNITY): Payer: Self-pay | Admitting: Nurse Practitioner

## 2020-02-27 DIAGNOSIS — U071 COVID-19: Secondary | ICD-10-CM

## 2020-02-27 DIAGNOSIS — R52 Pain, unspecified: Secondary | ICD-10-CM | POA: Diagnosis not present

## 2020-02-27 LAB — POC SARS CORONAVIRUS 2 AG -  ED: SARS Coronavirus 2 Ag: POSITIVE — AB

## 2020-02-27 MED ORDER — BENZONATATE 200 MG PO CAPS: 200.0000 mg | ORAL_CAPSULE | Freq: Three times a day (TID) | ORAL | 0 refills | Status: DC | PRN

## 2020-02-27 MED ORDER — PROMETHAZINE HCL 25 MG PO TABS: 12.5000 mg | ORAL_TABLET | Freq: Three times a day (TID) | ORAL | 0 refills | Status: DC | PRN

## 2020-02-27 NOTE — ED Triage Notes (Signed)
Pt presents with c/o cough, congestion, headache, body aches, n/v/d, fever (101 today) and sweats since this past Saturday. Pt states she has taken Excedrin Migraine for the fever and headache with some improvement. Pt reports her husband and daughter-in-law were also sick but have improved. She states they have not been tested for COVID.

## 2020-02-27 NOTE — ED Provider Notes (Signed)
Shannon [name]    CSN: [id number] Arrival date & time: 02/27/20  1502      History   Chief Complaint Chief Complaint  Patient presents with  . Generalized Body Aches  . Fever    HPI Shannon [name] is a 64 y.o. female.   Patient presents with nonproductive cough, headache, body aches, nausea, diarrhea, fever, chills, diaphoresis since 02/21/2020.  Tmax 101.  5-6 episodes of diarrhea today; no emesis.  She states other members in her household have been sick but have gotten better; no one has gotten a COVID test.  She has been treating her symptoms at home with Excedrin Migraine.  The history is provided by the patient.    Past Medical History:  Diagnosis Date  . Allergy   . Arthritis    hip, back, shoulders, joints  . Asthma   . Complication of anesthesia    problems with block  . Depression   . Diabetes mellitus 11/12   type 2  . Family history of adverse reaction to anesthesia    PROBLEMS WITH BLOCKS  . Fatty liver   . GERD (gastroesophageal reflux disease)   . Glaucoma suspect   . Headache    migraines-4x a week headache-1x week migraine  . Hyperlipidemia   . Hypertension    Controlled on meds  . Mitral valve prolapse   . Osteopenia   . PONV (postoperative nausea and vomiting)   . Shingles 11/2006  . Sleep disturbance   . Tachycardia   . Vertigo    Hx of    Patient Active Problem List   Diagnosis Date Noted  . UTI (urinary tract infection) 09/09/2019  . Gouty arthritis of right great toe 07/04/2019  . Contracture of finger joint, left 07/04/2019  . Pain in both lower extremities 05/17/2019  . Fever and chills 06/02/2018  . Status post total hip replacement, right 05/29/2018  . Preoperative evaluation to rule out surgical contraindication 05/20/2018  . Headache 05/20/2018  . Low back pain 06/18/2017  . Fatigue 04/20/2017  . 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 Colitis 03/17/2014  . Routine general medical examination at a health care facility  09/16/2012  . Mixed hyperlipidemia   . Type 2 diabetes mellitus with neurological manifestations, controlled ([place])   . FIBROCYSTIC BREAST DISEASE 06/12/2007  . GLAUCOMA 05/30/2007  . Essential hypertension, benign 05/30/2007  . MITRAL VALVE PROLAPSE 05/30/2007  . ASTHMA 05/30/2007  . OSTEOPENIA 05/30/2007  . ALLERGIC RHINITIS 03/20/2007  . GERD 03/20/2007  . Sleep disturbance 03/20/2007    Past Surgical History:  Procedure Laterality Date  . ABDOMINAL HYSTERECTOMY  2000   /BSO - abn. [facility]  . APPENDECTOMY  1978  . AUGMENTATION MAMMAPLASTY Bilateral 1981  . BLADDER REPAIR     x2  . BREAST ENHANCEMENT SURGERY  1987  . [place] Left 12/18/2016   Procedure: Left Heart Cath and Coronary Angiography;  Surgeon: Wellington Hampshire, MD;  Location: [facility];  Service: Cardiovascular;  Laterality: Left;  . CARPAL TUNNEL RELEASE Right   . CERVICAL DISCECTOMY  11/2009   with synthetic discs inserted--  Dr Arnoldo Morale  . NM MYOVIEW LTD  03/2005   EF 56%, no ischemia  . RECTAL PROLAPSE REPAIR     x1  . SHOULDER ARTHROSCOPY WITH OPEN ROTATOR CUFF REPAIR Left 12/29/2019   Procedure: LEFT SHOULDER ARTHROSCOPIC DISTAL CLAVICLE EXCISISON, SUBACROMIAL DECOMPRESSION,BICEPS [place] ;  Surgeon: Harlow Mares,  Elyn Aquas, MD;  Location: [facility];  Service: Orthopedics;  Laterality: Left;  . SHOULDER SURGERY  09/2008, 01/2009   Right shoulder surgery, then redone with lysis of adhesions  . TOTAL HIP ARTHROPLASTY Right 05/29/2018   Procedure: TOTAL HIP ARTHROPLASTY ANTERIOR APPROACH;  Surgeon: Lovell Sheehan, MD;  Location: [facility];  Service: Orthopedics;  Laterality: Right;    OB History   No obstetric history on file.      Home Medications    Prior to Admission medications   Medication Sig Start Date End Date Taking? Authorizing Provider  aspirin-acetaminophen-caffeine (EXCEDRIN MIGRAINE) [phone] MG tablet Take 1 tablet by  mouth every 6 (six) hours as needed for headache.   Yes [provider]  butalbital-acetaminophen-caffeine (FIORICET) 50-325-40 MG tablet Take 1-2 tablets by mouth every 6 (six) hours as needed for headache. 12/22/19 12/21/20 Yes Venia Carbon, MD  cetirizine (ZYRTEC) 10 MG tablet Take 10 mg by mouth daily. am   Yes [provider]  docusate sodium (COLACE) 100 MG capsule Take 300 mg by mouth at bedtime.    Yes [provider]  Insulin Glargine (LANTUS SOLOSTAR) 100 UNIT/ML Solostar Pen Inject 10 Units into the skin daily. Patient taking differently: Inject 10 Units into the skin at bedtime.  09/24/19  Yes Viviana Simpler I, MD  Insulin Pen Needle (PEN NEEDLES) 32G X 6 MM MISC 1 Units by Does not apply route daily. 03/07/19  Yes Viviana Simpler I, MD  LUMIGAN 0.01 % SOLN Place 1 drop into both eyes at bedtime.  10/13/15  Yes [provider]  metFORMIN (GLUCOPHAGE) 1000 MG tablet Take 1 tablet (1,000 mg total) by mouth 2 (two) times daily with a meal. 03/10/19  Yes Viviana Simpler I, MD  metoprolol succinate (TOPROL-XL) 25 MG 24 hr tablet TAKE 1 AND 1/2 TABLETS BY  MOUTH DAILY Patient taking differently: am 06/25/18  Yes Venia Carbon, MD  ondansetron (ZOFRAN) 4 MG tablet Take 1 tablet (4 mg total) by mouth every 8 (eight) hours as needed for nausea or vomiting. 12/22/19  Yes Venia Carbon, MD  [facility] DELICA LANCETS FINE MISC Check blood sugar once daily and as needed. 250.00 05/26/14  Yes Venia Carbon, MD  [facility] ULTRA test strip USE TO CHECK BLOOD SUGAR 2  TIMES DAILY 11/24/19  Yes Venia Carbon, MD  ONGLYZA 5 MG TABS tablet Take 1 tablet by mouth once daily 06/09/19  Yes Viviana Simpler I, MD  pantoprazole (PROTONIX) 40 MG tablet TAKE 1 TABLET BY MOUTH 2  TIMES DAILY ON AN EMPTY  STOMACH 04/09/19  Yes Viviana Simpler I, MD  rosuvastatin (CRESTOR) 5 MG tablet TAKE 1 TABLET BY MOUTH 3  TIMES WEEKLY 11/24/19  Yes Venia Carbon, MD  traZODone (DESYREL) 100  MG tablet TAKE 2 TABLETS BY MOUTH  EVERY NIGHT AT BEDTIME 07/07/19  Yes Venia Carbon, MD  triamterene-hydrochlorothiazide (MAXZIDE-25) 37.5-25 MG tablet TAKE 1 TABLET BY MOUTH ONCE A DAY Patient taking differently: am 11/24/19  Yes Venia Carbon, MD  valACYclovir (VALTREX) 1000 MG tablet TAKE 2 TABLETS BY MOUTH  AT ONSET OF SYMPTOMS AS NEEDED FOR FEVER BLISTERS AND TAKE 2 TABLETS 12 HOURS LATER. 01/26/20  Yes Venia Carbon, MD  gabapentin (NEURONTIN) 300 MG capsule Take 1 capsule (300 mg total) by mouth 4 (four) times daily as needed. 12/29/19 12/28/20  Lovell Sheehan, MD  HYDROcodone-acetaminophen (NORCO/VICODIN) 5-325 MG tablet Take 1 tablet by mouth every 4 (four) hours as  needed for moderate pain. 12/29/19 12/28/20  Lovell Sheehan, MD  MITIGARE 0.6 MG CAPS TAKE 1 CAPSULE BY MOUTH TWICE DAILY AS NEEDED 07/04/19   [provider]    Family History Family History  Problem Relation Age of Onset  . Diabetes Mother   . Heart disease Mother   . Hypertension Mother   . Ovarian cancer Mother   . Melanoma Mother   . Cancer Father   . Coronary artery disease Father   . Heart disease Father   . Diabetes Sister   . Depression Sister   . Coronary artery disease Sister   . Heart disease Sister   . Rheum arthritis Maternal Aunt   . Breast cancer Maternal Grandmother   . Diabetes Maternal Grandmother   . Heart attack Paternal Uncle   . Heart attack Cousin   . Heart attack Paternal Uncle   . Diabetes Maternal Grandfather   . Colon cancer Neg Hx     Social History Social History   Tobacco Use  . Smoking status: Never Smoker  . Smokeless tobacco: Never Used  Substance Use Topics  . Alcohol use: No  . Drug use: No     Allergies   Lisinopril, Nitrofurantoin, Oxycodone-acetaminophen, Atorvastatin, Clarithromycin, Glipizide, and Invokana [canagliflozin]   Review of Systems Review of Systems  Constitutional: Positive for chills, diaphoresis, fatigue and fever.  HENT:  Negative for ear pain and sore throat.   Eyes: Negative for pain and visual disturbance.  Respiratory: Positive for cough. Negative for shortness of breath.   Cardiovascular: Negative for chest pain and palpitations.  Gastrointestinal: Positive for diarrhea and nausea. Negative for abdominal pain and vomiting.  Genitourinary: Negative for dysuria and hematuria.  Musculoskeletal: Negative for arthralgias and back pain.  Skin: Negative for color change and rash.  Neurological: Positive for headaches. Negative for seizures and syncope.  All other systems reviewed and are negative.    Physical Exam Triage Vital Signs ED Triage Vitals  Enc Vitals Group     BP      Pulse      Resp      Temp      Temp src      SpO2      Weight      Height      Head Circumference      Peak Flow      Pain Score      Pain Loc      Pain Edu?      Excl. in [place]?    No data found.  Updated Vital Signs BP (!) 142/90 (BP Location: Right Arm)   Pulse (!) 126   Temp 99.4 F (37.4 C) (Oral)   Resp 18   Ht 5\' 4"  (1.626 m)   Wt 145 lb (65.8 kg)   SpO2 96%   BMI 24.89 kg/m   Visual Acuity Right Eye Distance:   Left Eye Distance:   Bilateral Distance:    Right Eye Near:   Left Eye Near:    Bilateral Near:     Physical Exam Vitals and nursing note reviewed.  Constitutional:      General: She is not in acute distress.    Appearance: She is well-developed. She is ill-appearing.  HENT:     Head: Normocephalic and atraumatic.     Right Ear: Tympanic membrane normal.     Left Ear: Tympanic membrane normal.     Nose: Nose normal.     Mouth/Throat:  Mouth: Mucous membranes are moist.     Pharynx: Oropharynx is clear.  Eyes:     Conjunctiva/sclera: Conjunctivae normal.  Cardiovascular:     Rate and Rhythm: Normal rate and regular rhythm.     Heart sounds: No murmur.  Pulmonary:     Effort: Pulmonary effort is normal. No respiratory distress.     Breath sounds: Normal breath sounds.    Abdominal:     General: Bowel sounds are normal.     Palpations: Abdomen is soft.     Tenderness: There is no abdominal tenderness. There is no guarding or rebound.  Musculoskeletal:     Cervical back: Neck supple.  Skin:    General: Skin is warm and dry.     Findings: No rash.  Neurological:     General: No focal deficit present.     Mental Status: She is alert and oriented to person, place, and time.     Gait: Gait normal.  Psychiatric:        Mood and Affect: Mood normal.        Behavior: Behavior normal.      UC Treatments / Results  Labs (all labs ordered are listed, but only abnormal results are displayed) Labs Reviewed  POC SARS CORONAVIRUS 2 AG -  ED - Abnormal; Notable for the following components:      Result Value   SARS Coronavirus 2 Ag Positive (*)    All other components within normal limits  NOVEL CORONAVIRUS, NAA    EKG   Radiology No results found.  Procedures Procedures (including critical care time)  Medications Ordered in UC Medications - No data to display  Initial Impression / Assessment and Plan / UC Course  I have reviewed the triage vital signs and the nursing notes.  Pertinent labs & imaging results that were available during my care of the patient were reviewed by me and considered in my medical decision making (see chart for details).   COVID-19.  POC COVID positive.  Referred patient to [facility].  Patient states she has Zofran at home to take for her nausea.  Discussed CDC guidelines for self quarantine.  Instructed her to go to the emergency department if she has a high fever, shortness of breath, severe diarrhea, or other concerning symptoms.  Patient agrees to plan of care.       Final Clinical Impressions(s) / UC Diagnoses   Final diagnoses:  [id number]     Discharge Instructions     Your COVID test is positive.    You should self-quarantine for:  *10 days since your symptoms first appeared  and  *24 hours with no fever, without the use of fever-reducing medications and  *your other symptoms of COVID are improving.  Most people do not need to be re-tested at the end of the quarantine period.    Go to the emergency department if you have high fever not relieved by Tylenol, shortness of breath, severe diarrhea, or other concerning symptoms.       ED Prescriptions    None     PDMP not reviewed this encounter.   Sharion Balloon, NP 02/27/20 [phone]

## 2020-02-27 NOTE — Progress Notes (Signed)
I connected by phone with Shannon [name] on 02/27/2020 at 5:59 PM to discuss the potential use of an new treatment for mild to moderate COVID-19 viral infection in non-hospitalized patients.  This patient is a 64 y.o. female that meets the FDA criteria for Emergency Use Authorization of bamlanivimab-etesevimab or casirivimab\imdevimab.  Has a (+) direct SARS-CoV-2 viral test result  Has mild or moderate COVID-19   Is ? 62 years of age and weighs ? 40 kg  Is NOT hospitalized due to COVID-19  Is NOT requiring oxygen therapy or requiring an increase in baseline oxygen flow rate due to COVID-19  Is within 10 days of symptom onset  Has at least one of the high risk factor(s) for progression to severe COVID-19 and/or hospitalization as defined in EUA.  Specific high risk criteria : Diabetes  I have spoken and communicated the following to the patient or parent/caregiver:  1. FDA has authorized the emergency use of bamlanivimab and casirivimab\imdevimab for the treatment of mild to moderate COVID-19 in adults and pediatric patients with positive results of direct SARS-CoV-2 viral testing who are 78 years of age and older weighing at least 40 kg, and who are at high risk for progressing to severe COVID-19 and/or hospitalization.  2. The significant known and potential risks and benefits of bamlanivimab and casirivimab\imdevimab, and the extent to which such potential risks and benefits are unknown.  3. Information on available alternative treatments and the risks and benefits of those alternatives, including clinical trials.  4. Patients treated with bamlanivimab and casirivimab\imdevimab should continue to self-isolate and use infection control measures (e.g., wear mask, isolate, social distance, avoid sharing personal items, clean and disinfect "high touch" surfaces, and frequent handwashing) according to CDC guidelines.   5. The patient or parent/caregiver has the option to accept or refuse  bamlanivimab or casirivimab\imdevimab .  After reviewing this information with the patient, the patient agreed to proceed with receiving the bamlanivimab-etesevimab infusion and will be provided a copy of the fact sheet prior to receiving the infusion.   Beckey Rutter, [place], AGNP-C [phone] ([place])

## 2020-02-27 NOTE — Discharge Instructions (Addendum)
Your COVID test is positive.    You should self-quarantine for:  *10 days since your symptoms first appeared and  *24 hours with no fever, without the use of fever-reducing medications and  *your other symptoms of COVID are improving.  Most people do not need to be re-tested at the end of the quarantine period.    Go to the emergency department if you have high fever not relieved by Tylenol, shortness of breath, severe diarrhea, or other concerning symptoms.    

## 2020-02-27 NOTE — Telephone Encounter (Signed)
Called to Discuss with patient about Covid symptoms and the use of bamlanivimab, a monoclonal antibody infusion for those with mild to moderate Covid symptoms and at a high risk of hospitalization.     Pt is qualified for this infusion at the [facility] infusion center due to co-morbid conditions and/or a member of an at-risk group.     Symptoms started last Saturday. Patient qualifies for MAB treatment. See orders encounter. Prescriptions sent for patient for nausea & vomiting refractory to zofran as well as tessalon for cough. Also discussed otc medications for symptoms.   Beckey Rutter, [place], AGNP-C [phone] ([place])

## 2020-02-28 MED ORDER — SODIUM CHLORIDE 0.9 % IV SOLN
Freq: Once | INTRAVENOUS | Status: AC
  Filled 2020-02-28: qty 700

## 2020-02-29 ENCOUNTER — Ambulatory Visit (HOSPITAL_COMMUNITY)
Admission: RE | Admit: 2020-02-29 | Discharge: 2020-02-29 | Disposition: A | Discharge status: Home / Self Care | Payer: 59 | Source: Ambulatory Visit | Attending: Pulmonary Disease | Admitting: Pulmonary Disease

## 2020-02-29 DIAGNOSIS — U071 COVID-19: Secondary | ICD-10-CM

## 2020-02-29 MED ORDER — EPINEPHRINE 0.3 MG/0.3ML IJ SOAJ: 0.3000 mg | Freq: Once | INTRAMUSCULAR | Status: DC | PRN

## 2020-02-29 MED ORDER — SODIUM CHLORIDE 0.9 % IV SOLN: INTRAVENOUS | Status: DC | PRN

## 2020-02-29 MED ORDER — FAMOTIDINE IN NACL 20-0.9 MG/50ML-% IV SOLN: 20.0000 mg | Freq: Once | INTRAVENOUS | Status: DC | PRN

## 2020-02-29 MED ORDER — DIPHENHYDRAMINE HCL 50 MG/ML IJ SOLN: 50.0000 mg | Freq: Once | INTRAMUSCULAR | Status: DC | PRN

## 2020-02-29 MED ORDER — METHYLPREDNISOLONE SODIUM SUCC 125 MG IJ SOLR: 125.0000 mg | Freq: Once | INTRAMUSCULAR | Status: DC | PRN

## 2020-02-29 MED ORDER — ONDANSETRON HCL 4 MG/2ML IJ SOLN
4.0000 mg | Freq: Once | INTRAMUSCULAR | Status: AC
  Administered 2020-02-29: 4 mg via INTRAVENOUS
  Filled 2020-02-29: qty 2

## 2020-02-29 MED ORDER — ALBUTEROL SULFATE HFA 108 (90 BASE) MCG/ACT IN AERS: 2.0000 | INHALATION_SPRAY | Freq: Once | RESPIRATORY_TRACT | Status: DC | PRN

## 2020-02-29 NOTE — Discharge Instructions (Signed)

## 2020-02-29 NOTE — Progress Notes (Signed)
  Diagnosis: COVID-19  Physician: Dr. Joya Gaskins   Procedure: [facility] Infusion Clinic Med: bamlanivimab\etesevimab infusion - Provided patient with bamlanimivab\etesevimab fact sheet for patients, parents and caregivers prior to infusion.  Complications: No immediate complications noted.  Discharge: Discharged home   Janine Ores 02/29/2020

## 2020-03-02 ENCOUNTER — Telehealth: Payer: Self-pay

## 2020-03-02 ENCOUNTER — Other Ambulatory Visit: Payer: Self-pay

## 2020-03-02 MED ORDER — ONDANSETRON HCL 4 MG PO TABS: 4.0000 mg | ORAL_TABLET | Freq: Three times a day (TID) | ORAL | 0 refills | Status: DC | PRN

## 2020-03-02 NOTE — Telephone Encounter (Signed)
Pt said dx with + covid on 02/27/20. On 03/01/20 pt started with rash on back and some itching; today the rash on rt side of back has spread around pts side to rt abd and is red,itchy and blisterlike. Pt said she had both shingrix vaccines last yr. Pt does not want to go to UC and pt scheduled virtual with Dr Silvio Pate on 03/03/20 at 9:45. UC precautions given and pt voiced understanding. Pt does not want call from [place] until after 03/03/20 at 8:30. FYI to Dr Silvio Pate and Larene Beach CMA.

## 2020-03-02 NOTE — Telephone Encounter (Signed)
Pt is nauseated and wants med for  nausea, ondansetron refilled. [facility] [address]

## 2020-03-03 ENCOUNTER — Telehealth (INDEPENDENT_AMBULATORY_CARE_PROVIDER_SITE_OTHER): Payer: 59 | Admitting: Internal Medicine

## 2020-03-03 ENCOUNTER — Encounter: Payer: Self-pay | Admitting: Internal Medicine

## 2020-03-03 DIAGNOSIS — U071 COVID-19: Secondary | ICD-10-CM

## 2020-03-03 DIAGNOSIS — B029 Zoster without complications: Secondary | ICD-10-CM

## 2020-03-03 DIAGNOSIS — B019 Varicella without complication: Secondary | ICD-10-CM | POA: Diagnosis not present

## 2020-03-03 NOTE — Assessment & Plan Note (Signed)
Not classic but may be due to starting 5-6 days ago---plus limited visualization on virtual visit The distribution and her symptoms are classic for shingles Will have her start valacyclovir 1000mg  tid for a week She is already staying in due to [place]

## 2020-03-03 NOTE — Assessment & Plan Note (Signed)
Feeling better since bam infusion Discussed quarantining to next week to be safe Should get COVID vaccine in 3 months

## 2020-03-03 NOTE — Progress Notes (Signed)
Subjective:    Patient ID: Shannon [name], female    DOB: 1956-04-13, 64 y.o.   MRN: [id number]  HPI Virtual visit due to a rash on her back Identification done Reviewed billing and she gave consent Participants--patient is at home and I am in my office  Diagnosed with COVID--- symptoms started about 10 days ago Mostly cough---then really sick 1 week ago Positive test 5 days ago Got bamlanivumab infusion 3 days ago--this went fine  Feeling better since then  Noticed rash on back 2 nights ago--could feel something Even before then, has sensitive area there and painful Still has some burning and pain  Did have shingles in the past---same area  Current Outpatient Medications on File Prior to Visit  Medication Sig Dispense Refill  . aspirin-acetaminophen-caffeine (EXCEDRIN MIGRAINE) 250-250-65 MG tablet Take 1 tablet by mouth every 6 (six) hours as needed for headache.    . benzonatate (TESSALON) 200 MG capsule Take 1 capsule (200 mg total) by mouth 3 (three) times daily as needed for cough. 60 capsule 0  . cetirizine (ZYRTEC) 10 MG tablet Take 10 mg by mouth daily. am    . docusate sodium (COLACE) 100 MG capsule Take 300 mg by mouth at bedtime.     . Insulin Glargine (LANTUS SOLOSTAR) 100 UNIT/ML Solostar Pen Inject 10 Units into the skin daily. (Patient taking differently: Inject 12 Units into the skin at bedtime. ) 5 pen 3  . Insulin Pen Needle (PEN NEEDLES) 32G X 6 MM MISC 1 Units by Does not apply route daily. 100 each 3  . LUMIGAN 0.01 % SOLN Place 1 drop into both eyes at bedtime.     . metFORMIN (GLUCOPHAGE) 1000 MG tablet Take 1 tablet (1,000 mg total) by mouth 2 (two) times daily with a meal. 180 tablet 3  . metoprolol succinate (TOPROL-XL) 25 MG 24 hr tablet TAKE 1 AND 1/2 TABLETS BY  MOUTH DAILY (Patient taking differently: am) 135 tablet 3  . ondansetron (ZOFRAN) 4 MG tablet Take 1 tablet (4 mg total) by mouth every 8 (eight) hours as needed. 60 tablet 0  . ONETOUCH  DELICA LANCETS FINE MISC Check blood sugar once daily and as needed. 250.00 100 each 1  . ONETOUCH ULTRA test strip USE TO CHECK BLOOD SUGAR 2  TIMES DAILY 200 strip 3  . ONGLYZA 5 MG TABS tablet Take 1 tablet by mouth once daily 30 tablet 11  . pantoprazole (PROTONIX) 40 MG tablet TAKE 1 TABLET BY MOUTH 2  TIMES DAILY ON AN EMPTY  STOMACH 180 tablet 3  . rosuvastatin (CRESTOR) 5 MG tablet TAKE 1 TABLET BY MOUTH 3  TIMES WEEKLY 39 tablet 3  . traZODone (DESYREL) 100 MG tablet TAKE 2 TABLETS BY MOUTH  EVERY NIGHT AT BEDTIME 180 tablet 3  . triamterene-hydrochlorothiazide (MAXZIDE-25) 37.5-25 MG tablet TAKE 1 TABLET BY MOUTH ONCE A DAY (Patient taking differently: am) 90 tablet 3  . butalbital-acetaminophen-caffeine (FIORICET) 50-325-40 MG tablet Take 1-2 tablets by mouth every 6 (six) hours as needed for headache. (Patient not taking: Reported on 03/03/2020) 20 tablet 0  . MITIGARE 0.6 MG CAPS TAKE 1 CAPSULE BY MOUTH TWICE DAILY AS NEEDED    . promethazine (PHENERGAN) 25 MG tablet Take 0.5-1 tablets (12.5-25 mg total) by mouth every 8 (eight) hours as needed for refractory nausea / vomiting. (Patient not taking: Reported on 03/03/2020) 30 tablet 0  . valACYclovir (VALTREX) 1000 MG tablet TAKE 2 TABLETS BY MOUTH  AT ONSET OF  SYMPTOMS AS NEEDED FOR FEVER BLISTERS AND TAKE 2 TABLETS 12 HOURS LATER. (Patient not taking: Reported on 03/03/2020) 90 tablet 0   No current facility-administered medications on file prior to visit.    Allergies  Allergen Reactions  . Lisinopril Cough  . Nitrofurantoin Other (See Comments)    Pt unsure  . Oxycodone-Acetaminophen Itching  . Atorvastatin Diarrhea    May have caused colitis--but not clear cut  . Clarithromycin Palpitations  . Glipizide Other (See Comments)    Sugars too variable  . Invokana [Canagliflozin]     Nausea, constipation    Past Medical History:  Diagnosis Date  . Allergy   . Arthritis    hip, back, shoulders, joints  . Asthma   .  Complication of anesthesia    problems with block  . Depression   . Diabetes mellitus 11/12   type 2  . Family history of adverse reaction to anesthesia    PROBLEMS WITH BLOCKS  . Fatty liver   . GERD (gastroesophageal reflux disease)   . Glaucoma suspect   . Headache    migraines-4x a week headache-1x week migraine  . Hyperlipidemia   . Hypertension    Controlled on meds  . Mitral valve prolapse   . Osteopenia   . PONV (postoperative nausea and vomiting)   . Shingles 11/2006  . Sleep disturbance   . Tachycardia   . Vertigo    Hx of    Past Surgical History:  Procedure Laterality Date  . ABDOMINAL HYSTERECTOMY  2000   /BSO - abn. Paps  . APPENDECTOMY  1978  . AUGMENTATION MAMMAPLASTY Bilateral 1981  . BLADDER REPAIR     x2  . BREAST ENHANCEMENT SURGERY  1987  . [place] Left 12/18/2016   Procedure: Left Heart Cath and Coronary Angiography;  Surgeon: Wellington Hampshire, MD;  Location: [place] [facility] [facility];  Service: Cardiovascular;  Laterality: Left;  . CARPAL TUNNEL RELEASE Right   . CERVICAL DISCECTOMY  11/2009   with synthetic discs inserted--  Dr Arnoldo Morale  . NM MYOVIEW LTD  03/2005   EF 56%, no ischemia  . RECTAL PROLAPSE REPAIR     x1  . SHOULDER ARTHROSCOPY WITH OPEN ROTATOR CUFF REPAIR Left 12/29/2019   Procedure: LEFT SHOULDER ARTHROSCOPIC DISTAL CLAVICLE EXCISISON, SUBACROMIAL DECOMPRESSION,BICEPS [place] ;  Surgeon: Lovell Sheehan, MD;  Location: [facility];  Service: Orthopedics;  Laterality: Left;  . SHOULDER SURGERY  09/2008, 01/2009   Right shoulder surgery, then redone with lysis of adhesions  . TOTAL HIP ARTHROPLASTY Right 05/29/2018   Procedure: TOTAL HIP ARTHROPLASTY ANTERIOR APPROACH;  Surgeon: Lovell Sheehan, MD;  Location: [facility];  Service: Orthopedics;  Laterality: Right;    Family History  Problem Relation Age of Onset  . Diabetes Mother   . Heart disease Mother   .  Hypertension Mother   . Ovarian cancer Mother   . Melanoma Mother   . Cancer Father   . Coronary artery disease Father   . Heart disease Father   . Diabetes Sister   . Depression Sister   . Coronary artery disease Sister   . Heart disease Sister   . Rheum arthritis Maternal Aunt   . Breast cancer Maternal Grandmother   . Diabetes Maternal Grandmother   . Heart attack Paternal Uncle   . Heart attack Cousin   . Heart attack Paternal Uncle   . Diabetes Maternal Grandfather   .  Colon cancer Neg Hx     Social History   Socioeconomic History  . Marital status: Married    Spouse name: Not on file  . Number of children: 5  . Years of education: Not on file  . Highest education level: Not on file  Occupational History  . Occupation: homemaker  Tobacco Use  . Smoking status: Never Smoker  . Smokeless tobacco: Never Used  Substance and Sexual Activity  . Alcohol use: No  . Drug use: No  . Sexual activity: Not on file  Other Topics Concern  . Not on file  Social History Narrative   Home maker   Doesn't exercise      Shannon [name] sister         Social Determinants of Health   Financial Resource Strain:   . Difficulty of Paying Living Expenses:   Food Insecurity:   . Worried About Charity fundraiser in the Last Year:   . Arboriculturist in the Last Year:   Transportation Needs:   . Film/video editor (Medical):   Marland Kitchen Lack of Transportation (Non-Medical):   Physical Activity:   . Days of Exercise per Week:   . Minutes of Exercise per Session:   Stress:   . Feeling of Stress :   Social Connections:   . Frequency of Communication with Friends and Family:   . Frequency of Social Gatherings with Friends and Family:   . Attends Religious Services:   . Active Member of Clubs or Organizations:   . Attends Archivist Meetings:   Marland Kitchen Marital Status:   Intimate Partner Violence:   . Fear of Current or Ex-Partner:   . Emotionally Abused:   Marland Kitchen Physically  Abused:   . Sexually Abused:    Review of Systems No fever for 2 days Smell and taste haven't been affected Hard to eat due to nausea---some better today    Objective:   Physical Exam  Constitutional: She appears well-developed. No distress.  Skin:  Has mostly papular rash in dermatomal distribution around right T12-L1 More diffuse redness under the lesions in the back           Assessment & Plan:

## 2020-03-03 NOTE — Telephone Encounter (Signed)
Okay A virtual visit should work for the rash

## 2020-03-10 ENCOUNTER — Other Ambulatory Visit: Payer: Self-pay

## 2020-03-10 DIAGNOSIS — R000 Tachycardia, unspecified: Secondary | ICD-10-CM

## 2020-03-10 MED ORDER — METFORMIN HCL 1000 MG PO TABS: 1000.0000 mg | ORAL_TABLET | Freq: Two times a day (BID) | ORAL | 3 refills | Status: DC

## 2020-03-10 MED ORDER — PANTOPRAZOLE SODIUM 40 MG PO TBEC: DELAYED_RELEASE_TABLET | ORAL | 3 refills | Status: DC

## 2020-03-10 MED ORDER — METOPROLOL SUCCINATE ER 25 MG PO TB24: 37.5000 mg | ORAL_TABLET | Freq: Every day | ORAL | 3 refills | Status: DC

## 2020-03-31 ENCOUNTER — Telehealth: Payer: Self-pay | Admitting: Internal Medicine

## 2020-03-31 NOTE — Telephone Encounter (Signed)
Attempted to schedule recall for June .  Call back tomorrow per patient

## 2020-04-16 ENCOUNTER — Ambulatory Visit (INDEPENDENT_AMBULATORY_CARE_PROVIDER_SITE_OTHER): Payer: 59 | Admitting: Family

## 2020-04-16 ENCOUNTER — Encounter: Payer: Self-pay | Admitting: Family

## 2020-04-16 ENCOUNTER — Other Ambulatory Visit: Payer: Self-pay

## 2020-04-16 VITALS — BP 128/84 | HR 86 | Ht 64.0 in | Wt 148.1 lb

## 2020-04-16 DIAGNOSIS — I10 Essential (primary) hypertension: Secondary | ICD-10-CM | POA: Diagnosis not present

## 2020-04-16 DIAGNOSIS — E782 Mixed hyperlipidemia: Secondary | ICD-10-CM | POA: Diagnosis not present

## 2020-04-16 DIAGNOSIS — Z01810 Encounter for preprocedural cardiovascular examination: Secondary | ICD-10-CM

## 2020-04-16 NOTE — Patient Instructions (Addendum)
Medication Instructions:  No medication changes today.   *If you need a refill on your cardiac medications before your next appointment, please call your pharmacy*   Lab Work: No lab work today.    Testing/Procedures: Your EKG today showed normal sinus rhythm which is a good result!  Follow-Up: At [facility], you and your health needs are our priority.  As part of our continuing mission to provide you with exceptional heart care, we have created designated Provider Care Teams.  These Care Teams include your primary Cardiologist (physician) and Advanced Practice Providers (APPs -  Physician Assistants and Nurse Practitioners) who all work together to provide you with the care you need, when you need it.  We recommend signing up for the patient portal called "MyChart".  Sign up information is provided on this After Visit Summary.  MyChart is used to connect with patients for Virtual Visits (Telemedicine).  Patients are able to view lab/test results, encounter notes, upcoming appointments, etc.  Non-urgent messages can be sent to your provider as well.   To learn more about what you can do with MyChart, go to [email or web address].    Your next appointment:   1 year(s)  The format for your next appointment:   In Person  Provider:    You may see Nelva Bush, MD or one of the following Advanced Practice Providers on your designated Care Team:    Murray Hodgkins, NP  Christell Faith, PA-C  Marrianne Mood, PA-C  Laurann Montana, NP   Other Instructions   Recommend continuing your regular exercise regimen working in the garden.   We will send a note to Dr. Harlow Mares that you are cleared for surgery.   If you need a gym in the winter months you can join [employer].

## 2020-04-16 NOTE — Progress Notes (Signed)
Office Visit    Patient Name: Shannon [name] Date of Encounter: 04/16/2020  Primary Care Provider:  Venia Carbon, MD Primary Cardiologist:  No primary care provider on file. Electrophysiologist:  None   Chief Complaint    Shannon [name] is a 64 y.o. female with a hx of HTN, HLD, DM 2, mitral valve prolapse, GERD presents today for follow-up of HTN  Past Medical History    Past Medical History:  Diagnosis Date  . Allergy   . Arthritis    hip, back, shoulders, joints  . Asthma   . Complication of anesthesia    problems with block  . Depression   . Diabetes mellitus 11/12   type 2  . Family history of adverse reaction to anesthesia    PROBLEMS WITH BLOCKS  . Fatty liver   . GERD (gastroesophageal reflux disease)   . Glaucoma suspect   . Headache    migraines-4x a week headache-1x week migraine  . Hyperlipidemia   . Hypertension    Controlled on meds  . Mitral valve prolapse   . Osteopenia   . PONV (postoperative nausea and vomiting)   . Shingles 11/2006  . Sleep disturbance   . Tachycardia   . Vertigo    Hx of   Past Surgical History:  Procedure Laterality Date  . ABDOMINAL HYSTERECTOMY  2000   /BSO - abn. Paps  . APPENDECTOMY  1978  . AUGMENTATION MAMMAPLASTY Bilateral 1981  . BLADDER REPAIR     x2  . BREAST ENHANCEMENT SURGERY  1987  . [place] Left 12/18/2016   Procedure: Left Heart Cath and Coronary Angiography;  Surgeon: Wellington Hampshire, MD;  Location: [place] [facility];  Service: Cardiovascular;  Laterality: Left;  . CARPAL TUNNEL RELEASE Right   . CERVICAL DISCECTOMY  11/2009   with synthetic discs inserted--  Dr Arnoldo Morale  . NM MYOVIEW LTD  03/2005   EF 56%, no ischemia  . RECTAL PROLAPSE REPAIR     x1  . SHOULDER ARTHROSCOPY WITH OPEN ROTATOR CUFF REPAIR Left 12/29/2019   Procedure: LEFT SHOULDER ARTHROSCOPIC DISTAL CLAVICLE EXCISISON, SUBACROMIAL DECOMPRESSION,BICEPS [place] ;  Surgeon: Lovell Sheehan, MD;  Location: [facility];  Service: Orthopedics;  Laterality: Left;  . SHOULDER SURGERY  09/2008, 01/2009   Right shoulder surgery, then redone with lysis of adhesions  . TOTAL HIP ARTHROPLASTY Right 05/29/2018   Procedure: TOTAL HIP ARTHROPLASTY ANTERIOR APPROACH;  Surgeon: Lovell Sheehan, MD;  Location: [facility];  Service: Orthopedics;  Laterality: Right;    Allergies  Allergies  Allergen Reactions  . Lisinopril Cough  . Nitrofurantoin Other (See Comments)    Pt unsure  . Oxycodone-Acetaminophen Itching  . Atorvastatin Diarrhea    May have caused colitis--but not clear cut  . Clarithromycin Palpitations  . Glipizide Other (See Comments)    Sugars too variable  . Invokana [Canagliflozin]     Nausea, constipation    History of Present Illness    Shannon [name] is a 64 y.o. female with a hx of HTN, HLD, DM2, mitral valve prolapse, GERD, chest pain last seen 04/2019 by Dr. Saunders Revel.  Previously followed in office with Dr.Ingle.  Seen in 12/2016 with short episodes of nonexertional chest pain the preceding cath with no significant CAD.  When seen 04/2019 reported chest pain had improved considerably.  She reports feeling well today.  Tells me she very much  enjoys spending time and working in her garden. Lives a very active lifestyle and endorses eating a heart healthy diet.  She had shoulder surgery in February had a since been told she has had a repeat procedure done next month.  Reports no shortness of breath nor dyspnea on exertion. Reports no chest pain, pressure, or tightness. No edema, orthopnea, PND. Reports no palpitations.   Reports ports blood sugar has been elevated at home with fasting glucoses as high as 200.  She plans to contact her primary care provider to follow-up.   EKGs/Labs/Other Studies Reviewed:   The following studies were reviewed today: [facility] 12-27-16  The left ventricular systolic function is normal.  LV end diastolic  pressure is mildly elevated.  The left ventricular ejection fraction is 55-65% by visual estimate.   1. Minimal luminal irregularities affecting the LAD. No evidence of obstructive coronary artery disease. 2. Normal LV systolic function. Mildly elevated left ventricular end-diastolic pressure.   Recommendations: The chest pain seems to be noncardiac.   Echo December 27, 2016 Left ventricle: The cavity size was normal. Wall thickness was    increased in a pattern of mild LVH. Systolic function was normal.    The estimated ejection fraction was in the range of 55% to 60%.    Wall motion was normal; there were no regional wall motion    abnormalities. Doppler parameters are consistent with abnormal    left ventricular relaxation (grade 1 diastolic dysfunction).  - Mitral valve: Systolic bowing without prolapse. There was trivial    regurgitation.  - Right ventricle: The cavity size was normal. Wall thickness was    normal. Systolic function was normal.   EKG:  EKG is ordered today.  The ekg ordered today demonstrates NSR 86 bpm with no acute ST/T wave changes.  Recent Labs: 12/22/2019: ALT 29; BUN 16; Creatinine, Ser 1.02; Hemoglobin 13.0; Platelets 316.0; Potassium 4.6; Sodium 141  Recent Lipid Panel    Component Value Date/Time   CHOL 180 12/22/2019 1653   CHOL 231 (H) 12/14/2016 1131   TRIG 335.0 (H) 12/22/2019 1653   HDL 48.70 12/22/2019 1653   HDL 59 12/14/2016 1131   CHOLHDL 4 12/22/2019 1653   VLDL 67.0 (H) 12/22/2019 1653   LDLCALC 126 (H) 12/14/2016 1131   LDLDIRECT 105.0 12/22/2019 1653    Home Medications   Current Meds  Medication Sig  . aspirin-acetaminophen-caffeine (EXCEDRIN MIGRAINE) 250-250-65 MG tablet Take 1 tablet by mouth every 6 (six) hours as needed for headache.  . cetirizine (ZYRTEC) 10 MG tablet Take 10 mg by mouth daily. am  . docusate sodium (COLACE) 100 MG capsule Take 300 mg by mouth at bedtime.   . Insulin Glargine (LANTUS SOLOSTAR) 100 UNIT/ML Solostar  Pen Inject 10 Units into the skin daily. (Patient taking differently: Inject 12 Units into the skin at bedtime. )  . Insulin Pen Needle (PEN NEEDLES) 32G X 6 MM MISC 1 Units by Does not apply route daily.  Marland Kitchen LUMIGAN 0.01 % SOLN Place 1 drop into both eyes at bedtime.   . metFORMIN (GLUCOPHAGE) 1000 MG tablet Take 1 tablet (1,000 mg total) by mouth 2 (two) times daily with a meal.  . metoprolol succinate (TOPROL-XL) 25 MG 24 hr tablet Take 1.5 tablets (37.5 mg total) by mouth daily.  Marland Kitchen MITIGARE 0.6 MG CAPS TAKE 1 CAPSULE BY MOUTH TWICE DAILY AS NEEDED  . ondansetron (ZOFRAN) 4 MG tablet Take 1 tablet (4 mg total) by mouth every 8 (eight) hours as needed.  Marland Kitchen  ONETOUCH DELICA LANCETS FINE MISC Check blood sugar once daily and as needed. 250.00  . ONETOUCH ULTRA test strip USE TO CHECK BLOOD SUGAR 2  TIMES DAILY  . ONGLYZA 5 MG TABS tablet Take 1 tablet by mouth once daily  . pantoprazole (PROTONIX) 40 MG tablet TAKE 1 TABLET BY MOUTH 2  TIMES DAILY ON AN EMPTY  STOMACH  . rosuvastatin (CRESTOR) 5 MG tablet TAKE 1 TABLET BY MOUTH 3  TIMES WEEKLY  . traZODone (DESYREL) 100 MG tablet TAKE 2 TABLETS BY MOUTH  EVERY NIGHT AT BEDTIME  . triamterene-hydrochlorothiazide (MAXZIDE-25) 37.5-25 MG tablet TAKE 1 TABLET BY MOUTH ONCE A DAY (Patient taking differently: am)  . valACYclovir (VALTREX) 1000 MG tablet TAKE 2 TABLETS BY MOUTH  AT ONSET OF SYMPTOMS AS NEEDED FOR FEVER BLISTERS AND TAKE 2 TABLETS 12 HOURS LATER.      Review of Systems   Review of Systems  Constitution: Negative for chills, fever and malaise/fatigue.  Cardiovascular: Negative for chest pain, dyspnea on exertion, leg swelling, near-syncope, orthopnea, palpitations and syncope.  Respiratory: Negative for cough, shortness of breath and wheezing.   Gastrointestinal: Negative for nausea and vomiting.  Neurological: Negative for dizziness, light-headedness and weakness.   All other systems reviewed and are otherwise negative except as  noted above.  Physical Exam    VS:  BP 128/84 (BP Location: Left Arm, Patient Position: Sitting, Cuff Size: Normal)   Pulse 86   Ht 5\' 4"  (1.626 m)   Wt 148 lb 2 oz (67.2 kg)   SpO2 98%   BMI 25.43 kg/m  , BMI Body mass index is 25.43 kg/m. GEN: Well nourished, well developed, in no acute distress. HEENT: normal. Neck: Supple, no JVD, carotid bruits, or masses. Cardiac: RRR, no murmurs, rubs, or gallops. No clubbing, cyanosis, edema.  Radials/DP/PT 2+ and equal bilaterally.  Respiratory:  Respirations regular and unlabored, clear to auscultation bilaterally. GI: Soft, nontender, nondistended, BS + x 4. MS: No deformity or atrophy. Skin: Warm and dry, no rash. Neuro:  Strength and sensation are intact. Psych: Normal affect.   Assessment & Plan    1. Preoperative cardiovascular examination -plans for upcoming shoulder surgery with Dr. Loletha Grayer. According to the Revised Cardiac Risk Index (RCRI), her Perioperative Risk of Major Cardiac Event is (%): 0.9. Her Functional Capacity in METs is: 7.19 according to the Duke Activity Status Index (DASI).  She has a history of chronic cath in 2018 with no evidence of dysplasia CAD normal LVEF by echo with no significant valvular abnormalities.  Therefore based on ACC/AHA guidelines the patient would be acceptable risk for planned procedure without further cardiovascular testing.  I will route this recommendation to Dr. Loletha Grayer via epic fax function. 2. Chest pain - Reports no recurrent chest pain. Cardiac cath 2018 with mild luminal irregularities. No indication for ischemic evaluation. EKG today NSR with no acute ST/T wave changes.  3. HTN - BP well controlled. Continue present antihypertensive regimen.  4. HLD - LDL goal <100. Most recent LDL 105. Continue Crestor 5 mg 3 times per week.  Recommend lipid-lowering diet and repeat lipid panel in 6 months.  If LDL remains greater than 100 could consider increasing to 5 mg daily but addition of Zetia 10 mg  daily.  Anticipate she will be able to get to goal with dietary changes.  Disposition: Follow up in 1 year(s) with Dr. Saunders Revel or APP.    Loel Dubonnet, NP 04/16/2020, 11:00 AM

## 2020-04-26 ENCOUNTER — Other Ambulatory Visit: Payer: Self-pay | Admitting: Orthopedic Surgery

## 2020-04-27 ENCOUNTER — Other Ambulatory Visit: Payer: Self-pay

## 2020-04-27 ENCOUNTER — Encounter
Admission: RE | Admit: 2020-04-27 | Discharge: 2020-04-27 | Disposition: A | Discharge status: Home / Self Care | Payer: 59 | Source: Ambulatory Visit | Attending: Orthopedic Surgery | Admitting: Orthopedic Surgery

## 2020-04-27 HISTORY — DX: Personal history of other diseases of the digestive system: Z87.19

## 2020-04-27 HISTORY — DX: Pneumonia, unspecified organism: J18.9

## 2020-04-27 NOTE — Patient Instructions (Addendum)
Your procedure is scheduled on: Monday, June 14 Report to Day Surgery on the 2nd floor of the [facility]. To find out your arrival time, please call [phone] between 1PM - 3PM on: Friday, June 11  REMEMBER: Instructions that are not followed completely may result in serious medical risk, up to and including death; or upon the discretion of your surgeon and anesthesiologist your surgery may need to be rescheduled.  Do not eat food after midnight the night before surgery.  No gum chewing, lozengers or hard candies.  You may however, drink water up to 2 hours before you are scheduled to arrive for your surgery. Do not drink anything within 2 hours of your scheduled arrival time.  TAKE THESE MEDICATIONS THE MORNING OF SURGERY WITH A SIP OF WATER:  1.  Metoprolol 2.  Pantoprazole (Protonix) - (take one the night before and one on the morning of surgery - helps to prevent nausea after surgery.)  Stop Metformin 2 days prior to surgery. Last day to take is Friday, June 11; resume after surgery.  Take 1/2 of usual insulin dose the night before surgery and none on the morning of surgery. Only take 7 units Lantus the night before surgery  Stop Anti-inflammatories (NSAIDS) such as Advil, Aleve, Ibuprofen, Motrin, Naproxen, Naprosyn and Aspirin based products such as Excedrin, Goodys Powder, BC Powder. (May take Tylenol or Acetaminophen if needed.)  Stop ANY OVER THE COUNTER supplements until after surgery.  No Alcohol for 24 hours before or after surgery.  On the morning of surgery brush your teeth with toothpaste and water, you may rinse your mouth with mouthwash if you wish. Do not swallow any toothpaste or mouthwash.  Do not wear jewelry, make-up, hairpins, clips or nail polish.  Do not wear lotions, powders, or perfumes.   Do not shave 48 hours prior to surgery.   Do not bring valuables to the hospital. [facility] is not responsible for any missing/lost belongings or valuables.    Use CHG Soap as directed on instruction sheet.  Notify your doctor if there is any change in your medical condition (cold, fever, infection).  Wear comfortable clothing (specific to your surgery type) to the hospital.  Plan for stool softeners for home use; pain medications have a tendency to cause constipation. You can also help prevent constipation by eating foods high in fiber such as fruits and vegetables and drinking plenty of fluids as your diet allows.  After surgery, you can help prevent lung complications by doing breathing exercises.  Take deep breaths and cough every 1-2 hours. Your doctor may order a device called an Incentive Spirometer to help you take deep breaths.  If you are being discharged the day of surgery, you will not be allowed to drive home. You will need a responsible adult (18 years or older) to drive you home and stay with you that night.   If you are taking public transportation, you will need to have a responsible adult (18 years or older) with you. Please confirm with your physician that it is acceptable to use public transportation.   Please call the [facility] at [phone] if you have any questions about these instructions.  Visitation Policy:  Patients undergoing a surgery or procedure may have one family member or support person with them as long as that person is not COVID-19 positive or experiencing its symptoms.  That person may remain in the waiting area during the procedure.  As a reminder, masks  are still required for all [facility] team members, patients and visitors in all [facility] facilities.   Systemwide, no visitors 17 or younger.

## 2020-04-29 ENCOUNTER — Other Ambulatory Visit: Payer: Self-pay | Admitting: Gastroenterology

## 2020-04-29 ENCOUNTER — Other Ambulatory Visit
Admission: RE | Admit: 2020-04-29 | Discharge: 2020-04-29 | Disposition: A | Discharge status: Home / Self Care | Payer: 59 | Source: Ambulatory Visit | Attending: Orthopedic Surgery | Admitting: Orthopedic Surgery

## 2020-04-29 ENCOUNTER — Other Ambulatory Visit (HOSPITAL_COMMUNITY): Payer: Self-pay | Admitting: Gastroenterology

## 2020-04-29 ENCOUNTER — Other Ambulatory Visit: Payer: Self-pay

## 2020-04-29 DIAGNOSIS — Z01812 Encounter for preprocedural laboratory examination: Secondary | ICD-10-CM | POA: Insufficient documentation

## 2020-04-29 DIAGNOSIS — R1084 Generalized abdominal pain: Secondary | ICD-10-CM

## 2020-04-29 DIAGNOSIS — Z20822 Contact with and (suspected) exposure to covid-19: Secondary | ICD-10-CM | POA: Diagnosis not present

## 2020-04-29 DIAGNOSIS — R197 Diarrhea, unspecified: Secondary | ICD-10-CM

## 2020-04-29 DIAGNOSIS — K219 Gastro-esophageal reflux disease without esophagitis: Secondary | ICD-10-CM

## 2020-04-29 DIAGNOSIS — R1013 Epigastric pain: Secondary | ICD-10-CM

## 2020-04-29 LAB — BASIC METABOLIC PANEL
Anion gap: 11 (ref 5–15)
BUN: 15 mg/dL (ref 8–23)
CO2: 26 mmol/L (ref 22–32)
Calcium: 10 mg/dL (ref 8.9–10.3)
Chloride: 100 mmol/L (ref 98–111)
Creatinine, Ser: 0.87 mg/dL (ref 0.44–1.00)
GFR calc Af Amer: >60 mL/min (ref >60)
GFR calc non Af Amer: >60 mL/min (ref >60)
Glucose, Bld: 217 mg/dL — ABNORMAL HIGH (ref 70–99)
Potassium: 3.8 mmol/L (ref 3.5–5.1)
Sodium: 137 mmol/L (ref 135–145)

## 2020-04-29 LAB — CBC
HCT: 39 % (ref 36.0–46.0)
Hemoglobin: 12.7 g/dL (ref 12.0–15.0)
MCH: 27 pg (ref 26.0–34.0)
MCHC: 32.6 g/dL (ref 30.0–36.0)
MCV: 83 fL (ref 80.0–100.0)
Platelets: 307 10*3/uL (ref 150–400)
RBC: 4.7 MIL/uL (ref 3.87–5.11)
RDW: 14.6 % (ref 11.5–15.5)
WBC: 7.6 10*3/uL (ref 4.0–10.5)
nRBC: 0 % (ref 0.0–0.2)

## 2020-04-30 ENCOUNTER — Ambulatory Visit
Admission: RE | Admit: 2020-04-30 | Discharge: 2020-04-30 | Disposition: A | Discharge status: Home / Self Care | Payer: 59 | Source: Ambulatory Visit | Attending: Gastroenterology | Admitting: Gastroenterology

## 2020-04-30 DIAGNOSIS — R1013 Epigastric pain: Secondary | ICD-10-CM

## 2020-04-30 DIAGNOSIS — R1084 Generalized abdominal pain: Secondary | ICD-10-CM | POA: Diagnosis present

## 2020-04-30 DIAGNOSIS — K219 Gastro-esophageal reflux disease without esophagitis: Secondary | ICD-10-CM

## 2020-04-30 DIAGNOSIS — R197 Diarrhea, unspecified: Secondary | ICD-10-CM

## 2020-04-30 LAB — SARS CORONAVIRUS 2 (TAT 6-24 HRS): SARS Coronavirus 2: NEGATIVE

## 2020-05-01 ENCOUNTER — Other Ambulatory Visit: Payer: Self-pay | Admitting: Internal Medicine

## 2020-05-03 ENCOUNTER — Other Ambulatory Visit: Payer: Self-pay

## 2020-05-03 ENCOUNTER — Encounter
Admission: RE | Disposition: A | Discharge status: Home / Self Care | Payer: Self-pay | Source: Home / Self Care | Attending: Orthopedic Surgery

## 2020-05-03 ENCOUNTER — Ambulatory Visit: Payer: 59 | Admitting: Certified Registered Nurse Anesthetist

## 2020-05-03 ENCOUNTER — Ambulatory Visit: Payer: 59

## 2020-05-03 ENCOUNTER — Ambulatory Visit
Admission: RE | Admit: 2020-05-03 | Discharge: 2020-05-03 | Disposition: A | Discharge status: Home / Self Care | Payer: 59 | Attending: Orthopedic Surgery | Admitting: Orthopedic Surgery

## 2020-05-03 ENCOUNTER — Encounter: Payer: Self-pay | Admitting: Orthopedic Surgery

## 2020-05-03 DIAGNOSIS — E119 Type 2 diabetes mellitus without complications: Secondary | ICD-10-CM | POA: Diagnosis not present

## 2020-05-03 DIAGNOSIS — M7502 Adhesive capsulitis of left shoulder: Secondary | ICD-10-CM | POA: Insufficient documentation

## 2020-05-03 DIAGNOSIS — Z888 Allergy status to other drugs, medicaments and biological substances status: Secondary | ICD-10-CM | POA: Insufficient documentation

## 2020-05-03 DIAGNOSIS — M1990 Unspecified osteoarthritis, unspecified site: Secondary | ICD-10-CM | POA: Diagnosis not present

## 2020-05-03 DIAGNOSIS — K219 Gastro-esophageal reflux disease without esophagitis: Secondary | ICD-10-CM | POA: Diagnosis not present

## 2020-05-03 DIAGNOSIS — Z794 Long term (current) use of insulin: Secondary | ICD-10-CM | POA: Diagnosis not present

## 2020-05-03 DIAGNOSIS — J45909 Unspecified asthma, uncomplicated: Secondary | ICD-10-CM | POA: Diagnosis not present

## 2020-05-03 DIAGNOSIS — Z885 Allergy status to narcotic agent status: Secondary | ICD-10-CM | POA: Diagnosis not present

## 2020-05-03 DIAGNOSIS — Z79899 Other long term (current) drug therapy: Secondary | ICD-10-CM | POA: Diagnosis not present

## 2020-05-03 DIAGNOSIS — M25612 Stiffness of left shoulder, not elsewhere classified: Secondary | ICD-10-CM | POA: Insufficient documentation

## 2020-05-03 DIAGNOSIS — F329 Major depressive disorder, single episode, unspecified: Secondary | ICD-10-CM | POA: Insufficient documentation

## 2020-05-03 DIAGNOSIS — I10 Essential (primary) hypertension: Secondary | ICD-10-CM | POA: Diagnosis not present

## 2020-05-03 DIAGNOSIS — E785 Hyperlipidemia, unspecified: Secondary | ICD-10-CM | POA: Diagnosis not present

## 2020-05-03 DIAGNOSIS — Z881 Allergy status to other antibiotic agents status: Secondary | ICD-10-CM | POA: Insufficient documentation

## 2020-05-03 DIAGNOSIS — Z419 Encounter for procedure for purposes other than remedying health state, unspecified: Secondary | ICD-10-CM

## 2020-05-03 HISTORY — PX: SHOULDER CLOSED REDUCTION: SHX1051

## 2020-05-03 LAB — GLUCOSE, CAPILLARY
Glucose-Capillary: 123 mg/dL — ABNORMAL HIGH (ref 70–99)
Glucose-Capillary: 117 mg/dL — ABNORMAL HIGH (ref 70–99)
Glucose-Capillary: 151 mg/dL — ABNORMAL HIGH (ref 70–99)

## 2020-05-03 SURGERY — MANIPULATION, JOINT, SHOULDER, WITH ANESTHESIA
Anesthesia: General | Site: Shoulder | Laterality: Left

## 2020-05-03 MED ORDER — CEFAZOLIN SODIUM-DEXTROSE 2-4 GM/100ML-% IV SOLN
INTRAVENOUS | Status: AC
  Filled 2020-05-03: qty 100

## 2020-05-03 MED ORDER — ONDANSETRON HCL 4 MG/2ML IJ SOLN: 4.0000 mg | Freq: Four times a day (QID) | INTRAMUSCULAR | Status: DC | PRN

## 2020-05-03 MED ORDER — ONDANSETRON HCL 4 MG PO TABS: 4.0000 mg | ORAL_TABLET | Freq: Four times a day (QID) | ORAL | Status: DC | PRN

## 2020-05-03 MED ORDER — LACTATED RINGERS IV SOLN
INTRAVENOUS | Status: DC | PRN
  Administered 2020-05-03: 2 mL

## 2020-05-03 MED ORDER — HYDROCODONE-ACETAMINOPHEN 5-325 MG PO TABS
ORAL_TABLET | ORAL | Status: AC
  Administered 2020-05-03: 2 via ORAL
  Filled 2020-05-03: qty 2

## 2020-05-03 MED ORDER — FENTANYL CITRATE (PF) 100 MCG/2ML IJ SOLN
25.0000 ug | INTRAMUSCULAR | Status: DC | PRN
  Administered 2020-05-03 (×3): 25 ug via INTRAVENOUS

## 2020-05-03 MED ORDER — SCOPOLAMINE 1 MG/3DAYS TD PT72
MEDICATED_PATCH | TRANSDERMAL | Status: AC
  Administered 2020-05-03: 1.5 mg via TRANSDERMAL
  Filled 2020-05-03: qty 1

## 2020-05-03 MED ORDER — FENTANYL CITRATE (PF) 100 MCG/2ML IJ SOLN
INTRAMUSCULAR | Status: AC
  Administered 2020-05-03: 25 ug via INTRAVENOUS
  Filled 2020-05-03: qty 2

## 2020-05-03 MED ORDER — EPHEDRINE SULFATE 50 MG/ML IJ SOLN
INTRAMUSCULAR | Status: DC | PRN
  Administered 2020-05-03: 5 mg via INTRAVENOUS

## 2020-05-03 MED ORDER — ACETAMINOPHEN 500 MG PO TABS: 500.0000 mg | ORAL_TABLET | Freq: Four times a day (QID) | ORAL | Status: DC

## 2020-05-03 MED ORDER — ONDANSETRON HCL 4 MG/2ML IJ SOLN
INTRAMUSCULAR | Status: AC
  Administered 2020-05-03: 4 mg via INTRAVENOUS
  Filled 2020-05-03: qty 2

## 2020-05-03 MED ORDER — DEXMEDETOMIDINE HCL 200 MCG/2ML IV SOLN
INTRAVENOUS | Status: DC | PRN
  Administered 2020-05-03: 4 ug via INTRAVENOUS
  Administered 2020-05-03: 8 ug via INTRAVENOUS

## 2020-05-03 MED ORDER — HYDROCODONE-ACETAMINOPHEN 7.5-325 MG PO TABS: 1.0000 | ORAL_TABLET | ORAL | 0 refills | Status: DC | PRN

## 2020-05-03 MED ORDER — CEFAZOLIN SODIUM-DEXTROSE 2-4 GM/100ML-% IV SOLN
2.0000 g | INTRAVENOUS | Status: AC
  Administered 2020-05-03: 2 g via INTRAVENOUS

## 2020-05-03 MED ORDER — ONDANSETRON HCL 4 MG/2ML IJ SOLN
INTRAMUSCULAR | Status: DC | PRN
  Administered 2020-05-03: 4 mg via INTRAVENOUS

## 2020-05-03 MED ORDER — BUPIVACAINE LIPOSOME 1.3 % IJ SUSP: INTRAMUSCULAR | Status: DC | PRN

## 2020-05-03 MED ORDER — EPINEPHRINE PF 1 MG/ML IJ SOLN
INTRAMUSCULAR | Status: AC
  Filled 2020-05-03: qty 3

## 2020-05-03 MED ORDER — MIDAZOLAM HCL 2 MG/2ML IJ SOLN
INTRAMUSCULAR | Status: AC
  Filled 2020-05-03: qty 2

## 2020-05-03 MED ORDER — SODIUM CHLORIDE 0.9 % IV SOLN: INTRAVENOUS | Status: DC

## 2020-05-03 MED ORDER — LIDOCAINE HCL (CARDIAC) PF 100 MG/5ML IV SOSY
PREFILLED_SYRINGE | INTRAVENOUS | Status: DC | PRN
  Administered 2020-05-03: 80 mg via INTRAVENOUS

## 2020-05-03 MED ORDER — LACTATED RINGERS IV SOLN: INTRAVENOUS | Status: DC

## 2020-05-03 MED ORDER — ONDANSETRON HCL 4 MG/2ML IJ SOLN: 4.0000 mg | Freq: Once | INTRAMUSCULAR | Status: AC | PRN

## 2020-05-03 MED ORDER — FENTANYL CITRATE (PF) 100 MCG/2ML IJ SOLN
INTRAMUSCULAR | Status: AC
  Administered 2020-05-03: 50 ug
  Filled 2020-05-03: qty 2

## 2020-05-03 MED ORDER — DEXAMETHASONE SODIUM PHOSPHATE 10 MG/ML IJ SOLN
INTRAMUSCULAR | Status: DC | PRN
  Administered 2020-05-03: 4 mg via INTRAVENOUS

## 2020-05-03 MED ORDER — FENTANYL CITRATE (PF) 100 MCG/2ML IJ SOLN
INTRAMUSCULAR | Status: AC
  Filled 2020-05-03: qty 2

## 2020-05-03 MED ORDER — HYDROCODONE-ACETAMINOPHEN 7.5-325 MG PO TABS
1.0000 | ORAL_TABLET | ORAL | Status: DC | PRN
  Filled 2020-05-03: qty 2

## 2020-05-03 MED ORDER — PROPOFOL 10 MG/ML IV BOLUS
INTRAVENOUS | Status: DC | PRN
  Administered 2020-05-03: 150 mg via INTRAVENOUS

## 2020-05-03 MED ORDER — ACETAMINOPHEN 10 MG/ML IV SOLN
INTRAVENOUS | Status: AC
  Filled 2020-05-03: qty 100

## 2020-05-03 MED ORDER — DEXTROSE 50 % IV SOLN: 12.5000 g | Freq: Once | INTRAVENOUS | Status: AC

## 2020-05-03 MED ORDER — PROPOFOL 10 MG/ML IV BOLUS
INTRAVENOUS | Status: AC
  Filled 2020-05-03: qty 20

## 2020-05-03 MED ORDER — ONDANSETRON HCL 4 MG/2ML IJ SOLN
INTRAMUSCULAR | Status: AC
  Filled 2020-05-03: qty 2

## 2020-05-03 MED ORDER — ACETAMINOPHEN 325 MG PO TABS: 325.0000 mg | ORAL_TABLET | Freq: Four times a day (QID) | ORAL | Status: DC | PRN

## 2020-05-03 MED ORDER — BUPIVACAINE HCL (PF) 0.5 % IJ SOLN
INTRAMUSCULAR | Status: AC
  Filled 2020-05-03: qty 10

## 2020-05-03 MED ORDER — HYDROCODONE-ACETAMINOPHEN 5-325 MG PO TABS: 1.0000 | ORAL_TABLET | ORAL | Status: DC | PRN

## 2020-05-03 MED ORDER — BUPIVACAINE LIPOSOME 1.3 % IJ SUSP
INTRAMUSCULAR | Status: AC
  Filled 2020-05-03: qty 20

## 2020-05-03 MED ORDER — FENTANYL CITRATE (PF) 100 MCG/2ML IJ SOLN
INTRAMUSCULAR | Status: DC | PRN
  Administered 2020-05-03 (×2): 50 ug via INTRAVENOUS

## 2020-05-03 MED ORDER — CHLORHEXIDINE GLUCONATE 0.12 % MT SOLN: 15.0000 mL | Freq: Once | OROMUCOSAL | Status: AC

## 2020-05-03 MED ORDER — DEXTROSE 50 % IV SOLN
INTRAVENOUS | Status: AC
  Administered 2020-05-03: 12.5 g via INTRAVENOUS
  Filled 2020-05-03: qty 50

## 2020-05-03 MED ORDER — CHLORHEXIDINE GLUCONATE 0.12 % MT SOLN
OROMUCOSAL | Status: AC
  Administered 2020-05-03: 15 mL via OROMUCOSAL
  Filled 2020-05-03: qty 15

## 2020-05-03 MED ORDER — ACETAMINOPHEN 10 MG/ML IV SOLN
INTRAVENOUS | Status: DC | PRN
  Administered 2020-05-03: 1000 mg via INTRAVENOUS

## 2020-05-03 MED ORDER — SODIUM CHLORIDE 0.9 % IV SOLN
INTRAVENOUS | Status: DC | PRN
  Administered 2020-05-03: 50 ug/min via INTRAVENOUS

## 2020-05-03 MED ORDER — KETOROLAC TROMETHAMINE 15 MG/ML IJ SOLN
INTRAMUSCULAR | Status: AC
  Administered 2020-05-03: 15 mg via INTRAVENOUS
  Filled 2020-05-03: qty 1

## 2020-05-03 MED ORDER — MORPHINE SULFATE (PF) 2 MG/ML IV SOLN: 0.5000 mg | INTRAVENOUS | Status: DC | PRN

## 2020-05-03 MED ORDER — EPHEDRINE 5 MG/ML INJ
INTRAVENOUS | Status: AC
  Filled 2020-05-03: qty 10

## 2020-05-03 MED ORDER — METOCLOPRAMIDE HCL 5 MG/ML IJ SOLN: 5.0000 mg | Freq: Three times a day (TID) | INTRAMUSCULAR | Status: DC | PRN

## 2020-05-03 MED ORDER — SCOPOLAMINE 1 MG/3DAYS TD PT72: 1.0000 | MEDICATED_PATCH | TRANSDERMAL | Status: DC

## 2020-05-03 MED ORDER — METOCLOPRAMIDE HCL 10 MG PO TABS: 5.0000 mg | ORAL_TABLET | Freq: Three times a day (TID) | ORAL | Status: DC | PRN

## 2020-05-03 MED ORDER — MIDAZOLAM HCL 2 MG/2ML IJ SOLN
INTRAMUSCULAR | Status: AC
  Administered 2020-05-03: 1 mg
  Filled 2020-05-03: qty 2

## 2020-05-03 MED ORDER — PHENYLEPHRINE HCL (PRESSORS) 10 MG/ML IV SOLN
INTRAVENOUS | Status: DC | PRN
  Administered 2020-05-03 (×3): 100 ug via INTRAVENOUS

## 2020-05-03 MED ORDER — BUPIVACAINE HCL (PF) 0.5 % IJ SOLN: INTRAMUSCULAR | Status: DC | PRN

## 2020-05-03 MED ORDER — DEXMEDETOMIDINE HCL IN NACL 80 MCG/20ML IV SOLN
INTRAVENOUS | Status: AC
  Filled 2020-05-03: qty 20

## 2020-05-03 MED ORDER — LIDOCAINE HCL (PF) 1 % IJ SOLN
INTRAMUSCULAR | Status: AC
  Filled 2020-05-03: qty 5

## 2020-05-03 MED ORDER — KETOROLAC TROMETHAMINE 15 MG/ML IJ SOLN: 15.0000 mg | Freq: Four times a day (QID) | INTRAMUSCULAR | Status: DC

## 2020-05-03 MED ORDER — DEXAMETHASONE SODIUM PHOSPHATE 10 MG/ML IJ SOLN
INTRAMUSCULAR | Status: AC
  Filled 2020-05-03: qty 1

## 2020-05-03 MED ORDER — SUGAMMADEX SODIUM 200 MG/2ML IV SOLN
INTRAVENOUS | Status: DC | PRN
  Administered 2020-05-03: 150 mg via INTRAVENOUS

## 2020-05-03 MED ORDER — ORAL CARE MOUTH RINSE: 15.0000 mL | Freq: Once | OROMUCOSAL | Status: AC

## 2020-05-03 MED ORDER — ROCURONIUM BROMIDE 10 MG/ML (PF) SYRINGE
PREFILLED_SYRINGE | INTRAVENOUS | Status: AC
  Filled 2020-05-03: qty 10

## 2020-05-03 MED ORDER — ROCURONIUM BROMIDE 100 MG/10ML IV SOLN
INTRAVENOUS | Status: DC | PRN
  Administered 2020-05-03: 50 mg via INTRAVENOUS

## 2020-05-03 SURGICAL SUPPLY — 70 items
ADAPTER IRRIG TUBE 2 SPIKE SOL (ADAPTER) ×6 IMPLANT
ADPR TBG 2 SPK PMP STRL ASCP (ADAPTER) ×4
APL PRP STRL LF DISP 70% ISPRP (MISCELLANEOUS) ×2
BLADE FULL RADIUS 3.5 (BLADE) ×3 IMPLANT
BLADE INCISOR PLUS 4.5 (BLADE) ×3 IMPLANT
BLADE SURG MINI STRL (BLADE) ×3 IMPLANT
BRUSH SCRUB EZ  4% CHG (MISCELLANEOUS) ×3
BRUSH SCRUB EZ 4% CHG (MISCELLANEOUS) ×2 IMPLANT
BUR ACROMIONIZER 4.0 (BURR) IMPLANT
BUR BR 5.5 WIDE MOUTH (BURR) IMPLANT
CANNULA 5.75X7 CRYSTAL CLEAR (CANNULA) ×3 IMPLANT
CANNULA PARTIAL THREAD 2X7 (CANNULA) ×3 IMPLANT
CANNULA SHOULDER 7CM (CANNULA) ×3 IMPLANT
CANNULA TWIST IN 8.25X7CM (CANNULA) ×3 IMPLANT
CANNULA TWIST IN 8.25X9CM (CANNULA) ×3 IMPLANT
CHLORAPREP W/TINT 26 (MISCELLANEOUS) ×3 IMPLANT
COOLER POLAR GLACIER W/PUMP (MISCELLANEOUS) ×3 IMPLANT
COVER WAND RF STERILE (DRAPES) ×3 IMPLANT
CRADLE LAMINECT ARM (MISCELLANEOUS) ×6 IMPLANT
DEVICE SUCT BLK HOLE OR FLOOR (MISCELLANEOUS) ×1 IMPLANT
DRAPE 3/4 80X56 (DRAPES) ×3 IMPLANT
DRAPE INCISE IOBAN 66X45 STRL (DRAPES) ×3 IMPLANT
DRAPE STERI 35X30 U-POUCH (DRAPES) ×3 IMPLANT
DRAPE U-SHAPE 47X51 STRL (DRAPES) ×3 IMPLANT
ELECT REM PT RETURN 9FT ADLT (ELECTROSURGICAL)
ELECTRODE REM PT RTRN 9FT ADLT (ELECTROSURGICAL) IMPLANT
GAUZE 4X4 16PLY RFD (DISPOSABLE) IMPLANT
GAUZE SPONGE 4X4 12PLY STRL (GAUZE/BANDAGES/DRESSINGS) ×3 IMPLANT
GAUZE XEROFORM 1X8 LF (GAUZE/BANDAGES/DRESSINGS) ×3 IMPLANT
GLOVE BIOGEL PI IND STRL 8 (GLOVE) ×2 IMPLANT
GLOVE BIOGEL PI INDICATOR 8 (GLOVE) ×1
GLOVE SURG ORTHO 8.0 STRL STRW (GLOVE) ×3 IMPLANT
GOWN STRL REUS W/ TWL LRG LVL3 (GOWN DISPOSABLE) ×4 IMPLANT
GOWN STRL REUS W/ TWL XL LVL3 (GOWN DISPOSABLE) ×2 IMPLANT
GOWN STRL REUS W/TWL LRG LVL3 (GOWN DISPOSABLE) ×6
GOWN STRL REUS W/TWL XL LVL3 (GOWN DISPOSABLE) ×3
IV LACTATED RINGER IRRG 3000ML (IV SOLUTION) ×6
IV LR IRRIG 3000ML ARTHROMATIC (IV SOLUTION) ×8 IMPLANT
KIT STABILIZATION SHOULDER (MISCELLANEOUS) ×3 IMPLANT
KIT TURNOVER KIT A (KITS) ×3 IMPLANT
MANIFOLD NEPTUNE II (INSTRUMENTS) ×6 IMPLANT
MASK FACE SPIDER DISP (MASK) ×3 IMPLANT
MAT ABSORB  FLUID 56X50 GRAY (MISCELLANEOUS) ×3
MAT ABSORB FLUID 56X50 GRAY (MISCELLANEOUS) ×3 IMPLANT
NDL SAFETY ECLIPSE 18X1.5 (NEEDLE) ×2 IMPLANT
NDL SCORPION MULTI FIRE (NEEDLE) IMPLANT
NDL SPNL 18GX3.5 QUINCKE PK (NEEDLE) ×1 IMPLANT
NEEDLE HYPO 18GX1.5 SHARP (NEEDLE) ×3
NEEDLE HYPO 22GX1.5 SAFETY (NEEDLE) ×3 IMPLANT
NEEDLE SCORPION MULTI FIRE (NEEDLE) IMPLANT
NEEDLE SPNL 18GX3.5 QUINCKE PK (NEEDLE) ×3 IMPLANT
PACK SHDR ARTHRO (MISCELLANEOUS) ×3 IMPLANT
PAD ABD DERMACEA PRESS 5X9 (GAUZE/BANDAGES/DRESSINGS) IMPLANT
PAD WRAPON POLAR SHDR XLG (MISCELLANEOUS) ×2 IMPLANT
SLING ARM LRG DEEP (SOFTGOODS) IMPLANT
SLING ULTRA II LG (MISCELLANEOUS) ×3 IMPLANT
STRAP SAFETY 5IN WIDE (MISCELLANEOUS) ×3 IMPLANT
SUT ETHILON NAB PS2 4-0 18IN (SUTURE) ×3 IMPLANT
SUT FIBERWIRE #2 38 T-5 BLUE (SUTURE)
SUT PDS AB 0 CT1 27 (SUTURE) ×3 IMPLANT
SUT PROLENE 0 CT 2 (SUTURE) ×3 IMPLANT
SUT TIGER TAPE 7 IN WHITE (SUTURE) IMPLANT
SUTURE FIBERWR #2 38 T-5 BLUE (SUTURE) IMPLANT
SYR 10ML LL (SYRINGE) ×3 IMPLANT
SYR 50ML LL SCALE MARK (SYRINGE) ×3 IMPLANT
TAPE MICROFOAM 4IN (TAPE) ×3 IMPLANT
TUBING ARTHRO INFLOW-ONLY STRL (TUBING) ×3 IMPLANT
TUBING CONNECTING 10 (TUBING) ×3 IMPLANT
WAND WEREWOLF FLOW 90D (MISCELLANEOUS) ×3 IMPLANT
WRAPON POLAR PAD SHDR XLG (MISCELLANEOUS) ×3

## 2020-05-03 NOTE — Anesthesia Procedure Notes (Signed)
Procedure Name: Intubation Date/Time: 05/03/2020 3:50 PM Performed by: Caryl Asp, CRNA Pre-anesthesia Checklist: Patient identified, Patient being monitored, Timeout performed, Emergency Drugs available and Suction available Patient Re-evaluated:Patient Re-evaluated prior to induction Oxygen Delivery Method: Circle system utilized Preoxygenation: Pre-oxygenation with 100% oxygen Induction Type: IV induction Ventilation: Mask ventilation without difficulty Laryngoscope Size: 3 and McGraph Grade View: Grade I Tube type: Oral Tube size: 7.0 mm Number of attempts: 1 Airway Equipment and Method: Stylet and Video-laryngoscopy Placement Confirmation: ETT inserted through vocal cords under direct vision,  positive ETCO2 and breath sounds checked- equal and bilateral Secured at: 21 cm Tube secured with: Tape Dental Injury: Teeth and Oropharynx as per pre-operative assessment

## 2020-05-03 NOTE — Op Note (Signed)
05/03/2020  4:51 PM  PATIENT:  Shannon [name]    PRE-OPERATIVE DIAGNOSIS:  Z98.890 Other specified postprocedural states M25.612 Stiffness of left shoulder, not elsewhere classified  POST-OPERATIVE DIAGNOSIS:  Same  PROCEDURE:  Left shoulder arthroscopic lysis of adhesions and manipulation under anesthesia  SURGEON:  Lovell Sheehan, MD  ASSIST:  Carlynn Spry, PA-C  ANESTHESIA:   General and block  PREOPERATIVE INDICATIONS:  Shannon [name] is a  64 y.o. female with a diagnosis of Z98.890 Other specified postprocedural states M25.612 Stiffness of left shoulder, not elsewhere classified who failed conservative measures and elected for surgical management.    I discussed the risks and benefits of surgery. The risks include but are not limited to infection, bleeding requiring blood transfusion, nerve or blood vessel injury, joint stiffness or loss of motion, persistent pain, weakness or instability, malunion, nonunion and hardware failure and the need for further surgery. Medical risks include but are not limited to DVT and pulmonary embolism, myocardial infarction, stroke, pneumonia, respiratory failure and death. Patient understood these risks and wished to proceed.   OPERATIVE FINDINGS: There was evidence of prior biceps tenotomy. Extensive inflammation and scarring was noted throughout the anterior capsule. There was no evidence of high-grade partial or full-thickness tear. The subscapularis was intact. Synovitis was also noted in the inferior recess. There were no loose bodies. The labrum was intact and stable to probing.  OPERATIVE PROCEDURE: The patient was met in the preoperative area. The left shoulder was signed with the word yes and my initials according the hospital's correct site of surgery protocol.  History and physical was updated. Patient was brought to the operating room where he underwent interscalene block and general anesthesia. The patient was placed in a beachchair  position.  A spider arm positioner was used for this case. Examination under anesthesia revealed limited forward flexion and abduction of 80 degrees. The patient had a negative sulcus sign.  A manipulation was performed to 140 degrees of forward flexion and 120 degrees of abduction. There was audible crepitus.  Patient was prepped and draped in a sterile fashion. A timeout was performed to verify the patient's name, date of birth, medical record number, correct site of surgery and correct procedure to be performed there was also used to verify the patient received antibiotics that all appropriate instruments, implants and radiographs studies were available in the room. Once all in attendance were in agreement case began.  Bony landmarks were drawn out with a surgical marker along with proposed arthroscopy incisions. These were pre-injected with 1% lidocaine plain. An 11 blade was used to establish a posterior portal through which the arthroscope was placed in the glenohumeral joint. A full diagnostic examination of the shoulder was performed. Please see findings for a complete report. Using the shaver and electrocautery abundant scar and synovitis was debrided.  The arthroscope was then placed in the subacromial space.  Extensive bursitis was encountered. A lateral portal was established with an 18-gauge spinal needle for localization. A 90 ArthroCare wand and shaver blade were used to form an extensive subdeltoid and subacromial bursectomy. There was no evidence of a bursal sided tear of the supra or infraspinatus. The prior rotator cuff repair was intact with crossing double row repair.  Final arthroscopic images were taken. Arthroscopic images were then removed.  Skin closure for the arthroscopic incisions was performed with 4-0 nylon.   0.25% Marcaine plain was then injected into the subacromial space for postoperative pain control. A dry sterile dressing  was applied.  The patient was placed in a  sling.  All sharp and it instrument counts were correct at the conclusion of the case. I was scrubbed and present for the entire case. I spoke with the patient's family postoperatively to let them know the case had been performed without complication and the patient was stable in recovery room.

## 2020-05-03 NOTE — H&P (Signed)
The patient has been re-examined, and the chart reviewed, and there have been no interval changes to the documented history and physical.  Plan a left shoulder scope with lysis of adhesions and manipulation under anesthesia today.  Anesthesia is consulted regarding a peripheral nerve block for post-operative pain.  The risks, benefits, and alternatives have been discussed at length, and the patient is willing to proceed.

## 2020-05-03 NOTE — Anesthesia Postprocedure Evaluation (Signed)
Anesthesia Post Note  Patient: Shannon [name]  Procedure(s) Performed: Left shoulder arthroscopic lysis of adhesions and manipulation under anesthesia (Left Shoulder)  Patient location during evaluation: PACU Anesthesia Type: General Level of consciousness: awake and alert Pain management: pain level controlled Vital Signs Assessment: post-procedure vital signs reviewed and stable Respiratory status: spontaneous breathing, nonlabored ventilation, respiratory function stable and patient connected to nasal cannula oxygen Cardiovascular status: blood pressure returned to baseline and stable Postop Assessment: no apparent nausea or vomiting Anesthetic complications: no   No complications documented.   Last Vitals:  Vitals:   05/03/20 1745 05/03/20 1748  BP: 128/77 128/77  Pulse: 88 91  Resp: 15 14  Temp: (!) 36.1 C   SpO2: 93% 93%    Last Pain:  Vitals:   05/03/20 1748  TempSrc:   PainSc: 3                  Arita Miss

## 2020-05-03 NOTE — Transfer of Care (Signed)
Immediate Anesthesia Transfer of Care Note  Patient: Shannon [name]  Procedure(s) Performed: Left shoulder arthroscopic lysis of adhesions and manipulation under anesthesia (Left Shoulder)  Patient Location: PACU  Anesthesia Type:General  Level of Consciousness: drowsy  Airway & Oxygen Therapy: Patient Spontanous Breathing and Patient connected to face mask oxygen  Post-op Assessment: Report given to RN and Post -op Vital signs reviewed and stable  Post vital signs: Reviewed and stable  Last Vitals:  Vitals Value Taken Time  BP    Temp    Pulse    Resp    SpO2      Last Pain:  Vitals:   05/03/20 1408  TempSrc: Tympanic  PainSc: 2          Complications: No complications documented.

## 2020-05-03 NOTE — Anesthesia Preprocedure Evaluation (Signed)
Anesthesia Evaluation  Patient identified by MRN, date of birth, ID band Patient awake    Reviewed: Allergy & Precautions, NPO status , Patient's Chart, lab work & pertinent test results  History of Anesthesia Complications (+) PONV and history of anesthetic complications  Airway Mallampati: II  TM Distance: >3 FB Neck ROM: Full    Dental no notable dental hx.    Pulmonary asthma , neg sleep apnea,    breath sounds clear to auscultation- rhonchi (-) wheezing      Cardiovascular hypertension, Pt. on medications (-) CAD, (-) Past MI, (-) Cardiac Stents and (-) CABG  Rhythm:Regular Rate:Normal - Systolic murmurs and - Diastolic murmurs    Neuro/Psych  Headaches, neg Seizures PSYCHIATRIC DISORDERS Depression    GI/Hepatic Neg liver ROS, hiatal hernia, GERD  ,  Endo/Other  diabetes, Insulin Dependent  Renal/GU negative Renal ROS     Musculoskeletal  (+) Arthritis ,   Abdominal (+) - obese,   Peds  Hematology negative hematology ROS (+)   Anesthesia Other Findings Past Medical History: No date: Allergy No date: Arthritis     Comment:  hip, back, shoulders, joints No date: Asthma No date: Complication of anesthesia     Comment:  problems with block during breast augmentation and               epidural during childbirth No date: Depression 11/12: Diabetes mellitus     Comment:  type 2 No date: Family history of adverse reaction to anesthesia     Comment:  PROBLEMS WITH BLOCKS No date: Fatty liver No date: GERD (gastroesophageal reflux disease) No date: Glaucoma suspect No date: Headache     Comment:  migraines-4x a week headache-1x week migraine No date: History of hiatal hernia No date: Hyperlipidemia No date: Hypertension     Comment:  Controlled on meds No date: Mitral valve prolapse 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 No date: Osteopenia No date: Pneumonia No date: PONV (postoperative nausea and  vomiting) 11/2006: Shingles No date: Sleep disturbance No date: Tachycardia No date: Vertigo     Comment:  Hx of   Reproductive/Obstetrics                             Anesthesia Physical Anesthesia Plan  ASA: II  Anesthesia Plan: General   Post-op Pain Management:  Regional for Post-op pain   Induction: Intravenous  PONV Risk Score and Plan: 3 and Dexamethasone, Ondansetron, Midazolam and Scopolamine patch - Pre-op  Airway Management Planned: Oral ETT  Additional Equipment:   Intra-op Plan:   Post-operative Plan: Extubation in OR  Informed Consent: I have reviewed the patients History and Physical, chart, labs and discussed the procedure including the risks, benefits and alternatives for the proposed anesthesia with the patient or authorized representative who has indicated his/her understanding and acceptance.     Dental advisory given  Plan Discussed with: CRNA and Anesthesiologist  Anesthesia Plan Comments:         Anesthesia Quick Evaluation

## 2020-05-03 NOTE — Discharge Instructions (Signed)
Wear sling at all times while nerve block is working. Sling for comfort after the block has worn off.  Start gentle pendulums, wall climbs and passive/active ROM when able.  Keep the dressing dry.  You may remove bandage in 3 days.  You may place Band-Aids over top of the incisions.  May shower once dressing is removed in 3 days.  Remove sling carefully only for showers, leaving arm down by your side while in the shower.  +++ Make sure to take some pain medication this evening before you fall asleep, in preparation for the nerve block wearing off in the middle of the night.  If the the pain medication causes itching, or is too strong, try taking a single tablet at a time, or combining with Benadryl.  You may be most comfortable sleeping in a recliner.  If you do sleep in near bed, placed pillows behind the shoulder that have the operation to support it.       Interscalene Nerve Block with Exparel  1.  For your surgery you have received an Interscalene Nerve Block with Exparel. 2. Nerve Blocks affect many types of nerves, including nerves that control movement, pain and normal sensation.  You may experience feelings such as numbness, tingling, heaviness, weakness or the inability to move your arm or the feeling or sensation that your arm has "fallen asleep". 3. A nerve block with Exparel can last up to 5 days.  Usually the weakness wears off first.  The tingling and heaviness usually wear off next.  Finally you may start to notice pain.  Keep in mind that this may occur in any order.  Once a nerve block starts to wear off it is usually completely gone within 60 minutes. 4. ISNB may cause mild shortness of breath, a hoarse voice, blurry vision, unequal pupils, or drooping of the face on the same side as the nerve block.  These symptoms will usually resolve with the numbness.  Very rarely the procedure itself can cause mild seizures. 5. If needed, your surgeon will give you a prescription for pain  medication.  It will take about 60 minutes for the oral pain medication to become fully effective.  So, it is recommended that you start taking this medication before the nerve block first begins to wear off, or when you first begin to feel discomfort. 6. Take your pain medication only as prescribed.  Pain medication can cause sedation and decrease your breathing if you take more than you need for the level of pain that you have. 7. Nausea is a common side effect of many pain medications.  You may want to eat something before taking your pain medicine to prevent nausea. 8. After an Interscalene nerve block, you cannot feel pain, pressure or extremes in temperature in the effected arm.  Because your arm is numb it is at an increased risk for injury.  To decrease the possibility of injury, please practice the following:  a. While you are awake change the position of your arm frequently to prevent too much pressure on any one area for prolonged periods of time. b.  If you have a cast or tight dressing, check the color or your fingers every couple of hours.  Call your surgeon with the appearance of any discoloration (white or blue). c. If you are given a sling to wear before you go home, please wear it  at all times until the block has completely worn off.  Do not get up at  night without your sling. d. Please contact [facility] or your surgeon if you do not begin to regain sensation after 7 days from the surgery.  Anesthesia may be contacted by calling the Same Day Surgery Department, Mon. through Fri., 6 am to 4 pm at [phone].   e. If you experience any other problems or concerns, please contact your surgeon's office. f. If you experience severe or prolonged shortness of breath go to the nearest emergency department.   AMBULATORY SURGERY  DISCHARGE INSTRUCTIONS   1) The drugs that you were given will stay in your system until tomorrow so for the next 24 hours you should not:  A) Drive an  automobile B) Make any legal decisions C) Drink any alcoholic beverage   2) You may resume regular meals tomorrow.  Today it is better to start with liquids and gradually work up to solid foods.  You may eat anything you prefer, but it is better to start with liquids, then soup and crackers, and gradually work up to solid foods.   3) Please notify your doctor immediately if you have any unusual bleeding, trouble breathing, redness and pain at the surgery site, drainage, fever, or pain not relieved by medication.    4) Additional Instructions:        Please contact your physician with any problems or Same Day Surgery at [phone], Monday through Friday 6 am to 4 pm, or [facility] at [facility] number at [phone].

## 2020-05-04 ENCOUNTER — Ambulatory Visit: Payer: 59

## 2020-05-04 ENCOUNTER — Encounter: Payer: Self-pay | Admitting: Orthopedic Surgery

## 2020-05-05 MED ORDER — BUPIVACAINE LIPOSOME 1.3 % IJ SUSP
INTRAMUSCULAR | Status: DC | PRN
  Administered 2020-05-03: 20 mL via PERINEURAL

## 2020-05-05 MED ORDER — LIDOCAINE HCL (PF) 1 % IJ SOLN
INTRAMUSCULAR | Status: DC | PRN
  Administered 2020-05-03: 3 mL

## 2020-05-05 MED ORDER — BUPIVACAINE HCL (PF) 0.5 % IJ SOLN
INTRAMUSCULAR | Status: DC | PRN
  Administered 2020-05-03: 10 mL

## 2020-05-05 NOTE — Anesthesia Procedure Notes (Signed)
Anesthesia Regional Block: Interscalene brachial plexus block   Pre-Anesthetic Checklist: ,, timeout performed, Correct Patient, Correct Site, Correct Laterality, Correct Procedure, Correct Position, site marked, Risks and benefits discussed,  Surgical consent,  Pre-op evaluation,  At surgeon's request and post-op pain management  Laterality: Left  Prep: chloraprep       Needles:  Injection technique: Single-shot  Needle Type: Stimiplex     Needle Length: 10cm  Needle Gauge: 21     Additional Needles:   Procedures:,,,, ultrasound used (permanent image in chart),,,,  Narrative:  Start time: 05/03/2020 2:41 PM End time: 05/03/2020 2:46 PM Injection made incrementally with aspirations every 5 mL.  Performed by: Personally  Anesthesiologist: Emmie Niemann, MD  Additional Notes: Functioning IV was confirmed and monitors were applied.  A Stimuplex needle was used. Sterile prep and drape,hand hygiene and sterile gloves were used.  Negative aspiration and negative test dose prior to incremental administration of local anesthetic. The patient tolerated the procedure well.

## 2020-05-05 NOTE — Addendum Note (Signed)
Addendum  created 05/05/20 0805 by Emmie Niemann, MD   Attestation recorded in [place], Child order released for a procedure order, Clinical Note Signed, Flowsheet accepted, Intraprocedure Attestations filed, Solicitor edited, Intraprocedure Event edited

## 2020-05-17 DIAGNOSIS — M7502 Adhesive capsulitis of left shoulder: Secondary | ICD-10-CM | POA: Insufficient documentation

## 2020-05-31 ENCOUNTER — Other Ambulatory Visit: Payer: Self-pay | Admitting: Internal Medicine

## 2020-05-31 DIAGNOSIS — Z1231 Encounter for screening mammogram for malignant neoplasm of breast: Secondary | ICD-10-CM

## 2020-06-06 ENCOUNTER — Other Ambulatory Visit: Payer: Self-pay | Admitting: Internal Medicine

## 2020-06-21 ENCOUNTER — Ambulatory Visit: Payer: 59 | Admitting: Internal Medicine

## 2020-06-29 ENCOUNTER — Other Ambulatory Visit: Payer: Self-pay | Admitting: Internal Medicine

## 2020-07-07 ENCOUNTER — Other Ambulatory Visit: Payer: Self-pay | Admitting: Internal Medicine

## 2020-07-13 ENCOUNTER — Encounter: Payer: Self-pay | Admitting: Internal Medicine

## 2020-07-13 ENCOUNTER — Other Ambulatory Visit: Payer: Self-pay

## 2020-07-13 ENCOUNTER — Ambulatory Visit (INDEPENDENT_AMBULATORY_CARE_PROVIDER_SITE_OTHER): Payer: 59 | Admitting: Internal Medicine

## 2020-07-13 ENCOUNTER — Other Ambulatory Visit: Payer: Self-pay | Admitting: Internal Medicine

## 2020-07-13 VITALS — BP 122/84 | HR 92 | Temp 98.6°F | Ht 64.0 in | Wt 148.0 lb

## 2020-07-13 DIAGNOSIS — R928 Other abnormal and inconclusive findings on diagnostic imaging of breast: Secondary | ICD-10-CM | POA: Diagnosis not present

## 2020-07-13 DIAGNOSIS — E1149 Type 2 diabetes mellitus with other diabetic neurological complication: Secondary | ICD-10-CM | POA: Diagnosis not present

## 2020-07-13 LAB — POCT GLYCOSYLATED HEMOGLOBIN (HGB A1C): Hemoglobin A1C: 7.8 % — AB (ref 4.0–5.6)

## 2020-07-13 MED ORDER — LANTUS SOLOSTAR 100 UNIT/ML ~~LOC~~ SOPN: 20.0000 [IU] | PEN_INJECTOR | Freq: Every day | SUBCUTANEOUS | 0 refills | Status: DC

## 2020-07-13 NOTE — Progress Notes (Signed)
Subjective:    Patient ID: Shannon [name], female    DOB: 1956-11-19, 64 y.o.   MRN: [id number]  HPI Here for follow up of diabetes This visit occurred during the SARS-CoV-2 public health emergency.  Safety protocols were in place, including screening questions prior to the visit, additional usage of staff PPE, and extensive cleaning of exam room while observing appropriate contact time as indicated for disinfecting solutions.   Sugars are staying up despite lifestyle Very careful with eating Stays active Sugars up at night so adjusting the insulin some Up to 12-15 units nightly  Current Outpatient Medications on File Prior to Visit  Medication Sig Dispense Refill  . aspirin-acetaminophen-caffeine (EXCEDRIN MIGRAINE) 250-250-65 MG tablet Take 1 tablet by mouth every 6 (six) hours as needed for headache.    . BD PEN NEEDLE MICRO U/F 32G X 6 MM MISC USE AS DIRECTED DAILY. 100 each 6  . cetirizine (ZYRTEC) 10 MG tablet Take 10 mg by mouth daily.     Marland Kitchen docusate sodium (COLACE) 100 MG capsule Take 300 mg by mouth at bedtime.     . fluticasone (FLONASE) 50 MCG/ACT nasal spray Place 1 spray into both nostrils daily.    . Insulin Glargine (LANTUS SOLOSTAR) 100 UNIT/ML Solostar Pen Inject 10 Units into the skin daily. (Patient taking differently: Inject 14 Units into the skin at bedtime. ) 5 pen 3  . LUMIGAN 0.01 % SOLN Place 1 drop into both eyes at bedtime.     . metFORMIN (GLUCOPHAGE) 1000 MG tablet Take 1 tablet (1,000 mg total) by mouth 2 (two) times daily with a meal. 180 tablet 3  . metoprolol succinate (TOPROL-XL) 25 MG 24 hr tablet Take 1.5 tablets (37.5 mg total) by mouth daily. 135 tablet 3  . ondansetron (ZOFRAN) 4 MG tablet Take 1 tablet (4 mg total) by mouth every 8 (eight) hours as needed. (Patient taking differently: Take 4 mg by mouth every 8 (eight) hours as needed for nausea or vomiting. ) 60 tablet 0  . ONETOUCH DELICA LANCETS FINE MISC Check blood sugar once daily and as  needed. 250.00 100 each 1  . ONETOUCH ULTRA test strip USE TO CHECK BLOOD SUGAR 2  TIMES DAILY 200 strip 3  . ONGLYZA 5 MG TABS tablet Take 1 tablet by mouth once daily 90 tablet 3  . pantoprazole (PROTONIX) 40 MG tablet TAKE 1 TABLET BY MOUTH 2  TIMES DAILY ON AN EMPTY  STOMACH (Patient taking differently: Take 40 mg by mouth 2 (two) times daily. ) 180 tablet 3  . rosuvastatin (CRESTOR) 5 MG tablet TAKE 1 TABLET BY MOUTH 3  TIMES WEEKLY (Patient taking differently: Take 5 mg by mouth 3 (three) times a week. ) 39 tablet 3  . sucralfate (CARAFATE) 1 g tablet Take 1 g by mouth in the morning, at noon, and at bedtime.    . traZODone (DESYREL) 100 MG tablet TAKE 2 TABLETS BY MOUTH  EVERY NIGHT AT BEDTIME 180 tablet 3  . triamterene-hydrochlorothiazide (MAXZIDE-25) 37.5-25 MG tablet TAKE 1 TABLET BY MOUTH ONCE A DAY (Patient taking differently: Take 1 tablet by mouth daily. ) 90 tablet 3  . valACYclovir (VALTREX) 1000 MG tablet TAKE 2 TABLETS BY MOUTH  AT ONSET OF SYMPTOMS AS NEEDED FOR FEVER BLISTERS AND TAKE 2 TABLETS 12 HOURS LATER. 90 tablet 3   No current facility-administered medications on file prior to visit.    Allergies  Allergen Reactions  . Lisinopril Cough  . Nitrofurantoin Other (  See Comments)    Pt unsure  . Oxycodone-Acetaminophen Itching  . Atorvastatin Diarrhea    May have caused colitis--but not clear cut  . Clarithromycin Palpitations  . Glipizide Other (See Comments)    Sugars too variable  . Invokana [Canagliflozin]     Nausea, constipation    Past Medical History:  Diagnosis Date  . Allergy   . Arthritis    hip, back, shoulders, joints  . Asthma   . Complication of anesthesia    problems with block during breast augmentation and epidural during childbirth  . Depression   . Diabetes mellitus 11/12   type 2  . Family history of adverse reaction to anesthesia    PROBLEMS WITH BLOCKS  . Fatty liver   . GERD (gastroesophageal reflux disease)   . Glaucoma  suspect   . Headache    migraines-4x a week headache-1x week migraine  . History of hiatal hernia   . Hyperlipidemia   . Hypertension    Controlled on meds  . Mitral valve prolapse   . 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 Osteopenia   . Pneumonia   . PONV (postoperative nausea and vomiting)   . Shingles 11/2006  . Sleep disturbance   . Tachycardia   . Vertigo    Hx of    Past Surgical History:  Procedure Laterality Date  . ABDOMINAL HYSTERECTOMY  2000   /BSO - abn. Paps  . APPENDECTOMY  1978  . AUGMENTATION MAMMAPLASTY Bilateral 1981  . BLADDER REPAIR     x2  . BREAST ENHANCEMENT SURGERY  1987  . [place] Left 12/18/2016   Procedure: Left Heart Cath and Coronary Angiography;  Surgeon: Wellington Hampshire, MD;  Location: [facility];  Service: Cardiovascular;  Laterality: Left;  . CARPAL TUNNEL RELEASE Right   . CERVICAL DISCECTOMY  11/2009   with synthetic discs inserted--  Dr Arnoldo Morale  . COLONOSCOPY    . JOINT REPLACEMENT    . NM MYOVIEW LTD  03/2005   EF 56%, no ischemia  . RECTAL PROLAPSE REPAIR     x1  . SHOULDER ARTHROSCOPY WITH OPEN ROTATOR CUFF REPAIR Left 12/29/2019   Procedure: LEFT SHOULDER ARTHROSCOPIC DISTAL CLAVICLE EXCISISON, SUBACROMIAL DECOMPRESSION,BICEPS [place] ;  Surgeon: Lovell Sheehan, MD;  Location: [facility];  Service: Orthopedics;  Laterality: Left;  . SHOULDER CLOSED REDUCTION Left 05/03/2020   Procedure: Left shoulder arthroscopic lysis of adhesions and manipulation under anesthesia;  Surgeon: Lovell Sheehan, MD;  Location: [facility];  Service: Orthopedics;  Laterality: Left;  . SHOULDER SURGERY  09/2008, 01/2009   Right shoulder surgery, then redone with lysis of adhesions  . TOTAL HIP ARTHROPLASTY Right 05/29/2018   Procedure: TOTAL HIP ARTHROPLASTY ANTERIOR APPROACH;  Surgeon: Lovell Sheehan, MD;  Location: [facility];  Service: Orthopedics;  Laterality: Right;  .  UPPER GI ENDOSCOPY      Family History  Problem Relation Age of Onset  . Diabetes Mother   . Heart disease Mother   . Hypertension Mother   . Ovarian cancer Mother   . Melanoma Mother   . Cancer Father   . Coronary artery disease Father   . Heart disease Father   . Diabetes Sister   . Depression Sister   . Coronary artery disease Sister   . Heart disease Sister   . Rheum arthritis Maternal Aunt   . Breast cancer Maternal Grandmother   .  Diabetes Maternal Grandmother   . Heart attack Paternal Uncle   . Heart attack Cousin   . Heart attack Paternal Uncle   . Diabetes Maternal Grandfather   . Colon cancer Neg Hx     Social History   Socioeconomic History  . Marital status: Married    Spouse name: Not on file  . Number of children: 5  . Years of education: Not on file  . Highest education level: Not on file  Occupational History  . Occupation: homemaker  Tobacco Use  . Smoking status: Never Smoker  . Smokeless tobacco: Never Used  Vaping Use  . Vaping Use: Never used  Substance and Sexual Activity  . Alcohol use: No  . Drug use: No  . Sexual activity: Not on file  Other Topics Concern  . Not on file  Social History Narrative   Home maker   Doesn't exercise      [name] sister         Social Determinants of Health   Financial Resource Strain:   . Difficulty of Paying Living Expenses: Not on file  Food Insecurity:   . Worried About Charity fundraiser in the Last Year: Not on file  . Ran Out of Food in the Last Year: Not on file  Transportation Needs:   . Lack of Transportation (Medical): Not on file  . Lack of Transportation (Non-Medical): Not on file  Physical Activity:   . Days of Exercise per Week: Not on file  . Minutes of Exercise per Session: Not on file  Stress:   . Feeling of Stress : Not on file  Social Connections:   . Frequency of Communication with Friends and Family: Not on file  . Frequency of Social Gatherings with Friends and  Family: Not on file  . Attends Religious Services: Not on file  . Active Member of Clubs or Organizations: Not on file  . Attends Archivist Meetings: Not on file  . Marital Status: Not on file  Intimate Partner Violence:   . Fear of Current or Ex-Partner: Not on file  . Emotionally Abused: Not on file  . Physically Abused: Not on file  . Sexually Abused: Not on file   Review of Systems Weight is stable Not sleeping great--but okay    Objective:   Physical Exam Constitutional:      Appearance: Normal appearance.  Cardiovascular:     Rate and Rhythm: Normal rate and regular rhythm.     Pulses: Normal pulses.     Heart sounds: No murmur heard.  No gallop.   Pulmonary:     Effort: Pulmonary effort is normal.     Breath sounds: Normal breath sounds. No wheezing or rales.  Musculoskeletal:     Cervical back: Neck supple.     Right lower leg: No edema.     Left lower leg: No edema.  Lymphadenopathy:     Cervical: No cervical adenopathy.  Neurological:     Mental Status: She is alert.  Psychiatric:        Mood and Affect: Mood normal.        Behavior: Behavior normal.            Assessment & Plan:

## 2020-07-13 NOTE — Assessment & Plan Note (Addendum)
Still running high but not checking fasting often Would probably titrate insulin Could consider farxiga or similar as well Neuropathy symptoms in feet slightly more prominent  Lab Results  Component Value Date   HGBA1C 7.8 (A) 07/13/2020   This is some better Discussed titrating the insulin more

## 2020-07-13 NOTE — Patient Instructions (Signed)
Please increase the lantus by 2 units every week if your fasting sugars remain over 130.

## 2020-07-21 ENCOUNTER — Ambulatory Visit
Admission: RE | Admit: 2020-07-21 | Discharge: 2020-07-21 | Disposition: A | Discharge status: Home / Self Care | Payer: 59 | Source: Ambulatory Visit | Attending: Internal Medicine | Admitting: Internal Medicine

## 2020-07-21 ENCOUNTER — Other Ambulatory Visit: Payer: Self-pay

## 2020-07-21 DIAGNOSIS — R928 Other abnormal and inconclusive findings on diagnostic imaging of breast: Secondary | ICD-10-CM | POA: Insufficient documentation

## 2020-07-23 ENCOUNTER — Encounter: Payer: Self-pay | Admitting: Internal Medicine

## 2020-08-23 ENCOUNTER — Ambulatory Visit: Payer: 59 | Admitting: Dermatology

## 2020-09-01 LAB — HM DIABETES EYE EXAM

## 2020-09-21 ENCOUNTER — Other Ambulatory Visit: Payer: Self-pay

## 2020-09-22 MED ORDER — ONDANSETRON HCL 4 MG PO TABS
4.0000 mg | ORAL_TABLET | Freq: Three times a day (TID) | ORAL | 0 refills | Status: DC | PRN
Start: 2020-09-22 — End: 2020-10-06

## 2020-10-06 ENCOUNTER — Other Ambulatory Visit: Payer: Self-pay | Admitting: Podiatry

## 2020-10-06 ENCOUNTER — Other Ambulatory Visit: Payer: Self-pay

## 2020-10-06 ENCOUNTER — Ambulatory Visit (INDEPENDENT_AMBULATORY_CARE_PROVIDER_SITE_OTHER): Payer: 59

## 2020-10-06 ENCOUNTER — Ambulatory Visit (INDEPENDENT_AMBULATORY_CARE_PROVIDER_SITE_OTHER): Payer: 59 | Admitting: Podiatry

## 2020-10-06 ENCOUNTER — Telehealth: Payer: Self-pay | Admitting: *Deleted

## 2020-10-06 ENCOUNTER — Encounter: Payer: Self-pay | Admitting: Podiatry

## 2020-10-06 DIAGNOSIS — M7752 Other enthesopathy of left foot: Secondary | ICD-10-CM | POA: Diagnosis not present

## 2020-10-06 DIAGNOSIS — M7661 Achilles tendinitis, right leg: Secondary | ICD-10-CM

## 2020-10-06 DIAGNOSIS — M722 Plantar fascial fibromatosis: Secondary | ICD-10-CM

## 2020-10-06 DIAGNOSIS — L989 Disorder of the skin and subcutaneous tissue, unspecified: Secondary | ICD-10-CM | POA: Diagnosis not present

## 2020-10-06 MED ORDER — FREESTYLE LIBRE 14 DAY SENSOR MISC: 1.0000 | 12 refills | Status: DC

## 2020-10-06 MED ORDER — FREESTYLE LIBRE 2 READER DEVI: 1.0000 | Freq: Once | 0 refills | Status: DC

## 2020-10-06 NOTE — Patient Instructions (Signed)

## 2020-10-06 NOTE — Progress Notes (Signed)
Subjective:  Patient ID: Shannon [name], female    DOB: [birth year],  MRN: [id number] HPI Chief Complaint  Patient presents with  . Foot Pain    Posterior heel/achilles/lateral ankle right - aching, swelling x 1 month, leaning over the counter last week and felt a pop, been ever since, no treament  . Foot Pain    Sub 5th met left - small, callused area x 1 month, tried trimming  . Diabetes    Last a1c was 7.4  . New Patient (Initial Visit)    64 y.o. female presents with the above complaint.   ROS: Denies fever chills nausea vomiting muscle aches pains calf pain back pain chest pain shortness of breath.  Past Medical History:  Diagnosis Date  . Allergy   . Arthritis    hip, back, shoulders, joints  . Asthma   . Complication of anesthesia    problems with block during breast augmentation and epidural during childbirth  . Depression   . Diabetes mellitus 11/12   type 2  . Family history of adverse reaction to anesthesia    PROBLEMS WITH BLOCKS  . Fatty liver   . GERD (gastroesophageal reflux disease)   . Glaucoma suspect   . Headache    migraines-4x a week headache-1x week migraine  . History of hiatal hernia   . Hyperlipidemia   . Hypertension    Controlled on meds  . Mitral valve prolapse   . 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 Osteopenia   . Pneumonia   . PONV (postoperative nausea and vomiting)   . Shingles 11/2006  . Sleep disturbance   . Tachycardia   . Vertigo    Hx of   Past Surgical History:  Procedure Laterality Date  . ABDOMINAL HYSTERECTOMY  2000   /BSO - abn. Paps  . APPENDECTOMY  1978  . AUGMENTATION MAMMAPLASTY Bilateral 1981  . BLADDER REPAIR     x2  . BREAST ENHANCEMENT SURGERY  1987  . [place] Left 12/18/2016   Procedure: Left Heart Cath and Coronary Angiography;  Surgeon: Wellington Hampshire, MD;  Location: [facility];  Service: Cardiovascular;  Laterality: Left;  . CARPAL  TUNNEL RELEASE Right   . CERVICAL DISCECTOMY  11/2009   with synthetic discs inserted--  Dr Arnoldo Morale  . COLONOSCOPY    . JOINT REPLACEMENT    . NM MYOVIEW LTD  03/2005   EF 56%, no ischemia  . RECTAL PROLAPSE REPAIR     x1  . SHOULDER ARTHROSCOPY WITH OPEN ROTATOR CUFF REPAIR Left 12/29/2019   Procedure: LEFT SHOULDER ARTHROSCOPIC DISTAL CLAVICLE EXCISISON, SUBACROMIAL DECOMPRESSION,BICEPS [place] ;  Surgeon: Lovell Sheehan, MD;  Location: [facility];  Service: Orthopedics;  Laterality: Left;  . SHOULDER CLOSED REDUCTION Left 05/03/2020   Procedure: Left shoulder arthroscopic lysis of adhesions and manipulation under anesthesia;  Surgeon: Lovell Sheehan, MD;  Location: [facility];  Service: Orthopedics;  Laterality: Left;  . SHOULDER SURGERY  09/2008, 01/2009   Right shoulder surgery, then redone with lysis of adhesions  . TOTAL HIP ARTHROPLASTY Right 05/29/2018   Procedure: TOTAL HIP ARTHROPLASTY ANTERIOR APPROACH;  Surgeon: Lovell Sheehan, MD;  Location: [facility];  Service: Orthopedics;  Laterality: Right;  . UPPER GI ENDOSCOPY      Current Outpatient Medications:  .  aspirin-acetaminophen-caffeine (EXCEDRIN MIGRAINE) 250-250-65 MG tablet, Take 1 tablet by mouth every 6 (six) hours as needed for  headache., Disp: , Rfl:  .  BD PEN NEEDLE MICRO U/F 32G X 6 MM MISC, USE AS DIRECTED DAILY., Disp: 100 each, Rfl: 6 .  Blood Glucose Monitoring Suppl (FIFTY50 GLUCOSE METER 2.0) w/Device KIT, OneTouch Ultra2 Meter kit, Disp: , Rfl:  .  cetirizine (ZYRTEC) 10 MG tablet, Take 10 mg by mouth daily. , Disp: , Rfl:  .  Continuous Blood Gluc Receiver (FREESTYLE LIBRE 2 READER) DEVI, 1 Device by Does not apply route once for 1 dose. Dx Code E11.59, Disp: 1 each, Rfl: 0 .  Continuous Blood Gluc Sensor (FREESTYLE LIBRE 14 DAY SENSOR) MISC, 1 each by Does not apply route every 14 (fourteen) days. DX Code E11.59, Disp: 2 each, Rfl: 12 .  docusate sodium (COLACE) 100 MG capsule, Take 300 mg by  mouth at bedtime. , Disp: , Rfl:  .  fluticasone (FLONASE) 50 MCG/ACT nasal spray, Place 1 spray into both nostrils daily., Disp: , Rfl:  .  insulin glargine (LANTUS SOLOSTAR) 100 UNIT/ML Solostar Pen, Inject 20 Units into the skin daily., Disp: 1 mL, Rfl: 0 .  LUMIGAN 0.01 % SOLN, Place 1 drop into both eyes at bedtime. , Disp: , Rfl:  .  metFORMIN (GLUCOPHAGE) 1000 MG tablet, Take 1 tablet (1,000 mg total) by mouth 2 (two) times daily with a meal., Disp: 180 tablet, Rfl: 3 .  metoprolol succinate (TOPROL-XL) 25 MG 24 hr tablet, Take 1.5 tablets (37.5 mg total) by mouth daily., Disp: 135 tablet, Rfl: 3 .  MITIGARE 0.6 MG CAPS, Take 1 capsule by mouth 2 (two) times daily as needed., Disp: , Rfl:  .  ondansetron (ZOFRAN-ODT) 4 MG disintegrating tablet, Take 4 mg by mouth every 8 (eight) hours as needed., Disp: , Rfl:  .  ONETOUCH DELICA LANCETS FINE MISC, Check blood sugar once daily and as needed. 250.00, Disp: 100 each, Rfl: 1 .  ONETOUCH ULTRA test strip, USE TO CHECK BLOOD SUGAR 2  TIMES DAILY, Disp: 200 strip, Rfl: 3 .  ONGLYZA 5 MG TABS tablet, Take 1 tablet by mouth once daily, Disp: 90 tablet, Rfl: 3 .  pantoprazole (PROTONIX) 40 MG tablet, TAKE 1 TABLET BY MOUTH 2  TIMES DAILY ON AN EMPTY  STOMACH (Patient taking differently: Take 40 mg by mouth 2 (two) times daily. ), Disp: 180 tablet, Rfl: 3 .  rosuvastatin (CRESTOR) 5 MG tablet, TAKE 1 TABLET BY MOUTH 3  TIMES WEEKLY (Patient taking differently: Take 5 mg by mouth 3 (three) times a week. ), Disp: 39 tablet, Rfl: 3 .  sucralfate (CARAFATE) 1 g tablet, Take 1 g by mouth in the morning, at noon, and at bedtime., Disp: , Rfl:  .  traZODone (DESYREL) 100 MG tablet, TAKE 2 TABLETS BY MOUTH  EVERY NIGHT AT BEDTIME, Disp: 180 tablet, Rfl: 3 .  triamterene-hydrochlorothiazide (MAXZIDE-25) 37.5-25 MG tablet, TAKE 1 TABLET BY MOUTH ONCE A DAY (Patient taking differently: Take 1 tablet by mouth daily. ), Disp: 90 tablet, Rfl: 3 .  valACYclovir  (VALTREX) 1000 MG tablet, TAKE 2 TABLETS BY MOUTH  AT ONSET OF SYMPTOMS AS NEEDED FOR FEVER BLISTERS AND TAKE 2 TABLETS 12 HOURS LATER., Disp: 90 tablet, Rfl: 3  Allergies  Allergen Reactions  . Lisinopril Cough  . Nitrofurantoin Other (See Comments)    Pt unsure  . Oxycodone-Acetaminophen Itching  . Atorvastatin Diarrhea    May have caused colitis--but not clear cut  . Clarithromycin Palpitations  . Glipizide Other (See Comments)    Sugars too variable  .  Invokana [Canagliflozin]     Nausea, constipation   Review of Systems Objective:  There were no vitals filed for this visit.  General: Well developed, nourished, in no acute distress, alert and oriented x3   Dermatological: Skin is warm, dry and supple bilateral. Nails x 10 are well maintained; remaining integument appears unremarkable at this time. There are no open sores, no preulcerative lesions, no rash or signs of infection present.  Vascular: Dorsalis Pedis artery and Posterior Tibial artery pedal pulses are 2/4 bilateral with immedate capillary fill time. Pedal hair growth present. No varicosities and no lower extremity edema present bilateral.   Neruologic: Grossly intact via light touch bilateral. Vibratory intact via tuning fork bilateral. Protective threshold with Semmes Wienstein monofilament intact to all pedal sites bilateral. Patellar and Achilles deep tendon reflexes 2+ bilateral. No Babinski or clonus noted bilateral.   Musculoskeletal: No gross boney pedal deformities bilateral. No pain, crepitus, or limitation noted with foot and ankle range of motion bilateral. Muscular strength 5/5 in all groups tested bilateral.  She has mild pain on palpation of the inferior aspect of the Achilles possibly an Achilles bursitis or where she and avulsed a small piece of spur.  She also has pain beneath the fifth metatarsal of the left foot.    Gait: Unassisted, Nonantalgic.    Radiographs:  Radiographs taken today  demonstrate osseously mature individual with a small retrocalcaneal spur avulsion with minimal thickening of the Achilles tendon and no soft tissue swelling within Cager's triangle.  Assessment & Plan:   Assessment: Bursitis subfifth met left.  Porokeratosis subfifth met left.  Insertional Achilles tendinitis bursitis right.  Plan: Discussed etiology pathology conservative surgical therapies at this point in time went ahead and injected the bursa today 2 milligrams dexamethasone subfifth metatarsal phalangeal joint and insertion of the Achilles making sure not to inject into the Achilles itself.  Also debrided the reactive hyperkeratotic lesion subfifth metatarsal head left.  Placed her in a plantar fascial night splint for her Achilles.  We will follow-up with her in 1 month.     Shannon [name] T. [place], [place]

## 2020-10-06 NOTE — Telephone Encounter (Signed)
Spoke to [place]. Told her Epic only pulls it up this way. I told her she needs a reader and sensors. Give her whatever she needs to get a reader and 2 sensors for a month.

## 2020-10-06 NOTE — Telephone Encounter (Signed)
Shannon [name] at [facility] [phone] left a voicemail stating that they received two e-scripts today for freesytle libre systems. Shannon [name] stated that the two do not work together so she needs to know which one to dispense. Shannon [name] stated that the 2 reader comes with it's own sensor and the 14 day cones with it's own sensor. Shannon [name] requested a call back to clarify which needs to be dispensed.

## 2020-10-08 ENCOUNTER — Other Ambulatory Visit: Payer: Self-pay | Admitting: Gastroenterology

## 2020-10-08 DIAGNOSIS — R1084 Generalized abdominal pain: Secondary | ICD-10-CM

## 2020-10-08 DIAGNOSIS — R1013 Epigastric pain: Secondary | ICD-10-CM

## 2020-10-08 DIAGNOSIS — R112 Nausea with vomiting, unspecified: Secondary | ICD-10-CM

## 2020-10-11 ENCOUNTER — Other Ambulatory Visit: Payer: Self-pay

## 2020-10-11 MED ORDER — SAXAGLIPTIN HCL 5 MG PO TABS
5.0000 mg | ORAL_TABLET | Freq: Every day | ORAL | 3 refills | Status: DC
Start: 2020-10-11 — End: 2020-12-31

## 2020-10-11 MED ORDER — LANTUS SOLOSTAR 100 UNIT/ML ~~LOC~~ SOPN: 20.0000 [IU] | PEN_INJECTOR | Freq: Every day | SUBCUTANEOUS | 3 refills | Status: DC

## 2020-10-11 MED ORDER — SUCRALFATE 1 G PO TABS
1.0000 g | ORAL_TABLET | Freq: Three times a day (TID) | ORAL | 3 refills | Status: DC
Start: 2020-10-11 — End: 2022-09-04

## 2020-10-21 ENCOUNTER — Ambulatory Visit (INDEPENDENT_AMBULATORY_CARE_PROVIDER_SITE_OTHER): Payer: 59 | Admitting: Podiatry

## 2020-10-21 ENCOUNTER — Encounter: Payer: Self-pay | Admitting: Podiatry

## 2020-10-21 ENCOUNTER — Other Ambulatory Visit: Payer: Self-pay

## 2020-10-21 DIAGNOSIS — M7661 Achilles tendinitis, right leg: Secondary | ICD-10-CM | POA: Diagnosis not present

## 2020-10-23 NOTE — Progress Notes (Signed)
She presents today for follow-up of her Achilles tendinitis of her right foot she states is really not any better she had some temporary relief but that was it.  Objective: Vital signs are stable she is alert and oriented x3 still has severe pain on palpation of the Achilles tendon right.  It does not appear to be a deficit margins appear to be relatively normal though it there is some swelling in the posterior superior aspect of the calcaneus area.  Assessment: Achilles tendinitis probable tear  Plan: Dispensed a short cam walker today but we are requesting an MRI without contrast of the right Achilles tendon to evaluate for surgical consideration tears as well as differential diagnosis.

## 2020-10-27 ENCOUNTER — Other Ambulatory Visit: Payer: Self-pay

## 2020-10-27 ENCOUNTER — Ambulatory Visit
Admission: RE | Admit: 2020-10-27 | Discharge: 2020-10-27 | Disposition: A | Discharge status: Home / Self Care | Payer: 59 | Source: Ambulatory Visit | Attending: Podiatry | Admitting: Podiatry

## 2020-10-27 DIAGNOSIS — M7661 Achilles tendinitis, right leg: Secondary | ICD-10-CM

## 2020-10-28 ENCOUNTER — Ambulatory Visit
Admission: RE | Admit: 2020-10-28 | Discharge: 2020-10-28 | Disposition: A | Discharge status: Home / Self Care | Payer: 59 | Source: Ambulatory Visit | Attending: Gastroenterology | Admitting: Gastroenterology

## 2020-10-28 DIAGNOSIS — R112 Nausea with vomiting, unspecified: Secondary | ICD-10-CM | POA: Diagnosis present

## 2020-10-28 DIAGNOSIS — R1084 Generalized abdominal pain: Secondary | ICD-10-CM | POA: Insufficient documentation

## 2020-10-28 DIAGNOSIS — R1013 Epigastric pain: Secondary | ICD-10-CM

## 2020-10-28 MED ORDER — TECHNETIUM TC 99M MEBROFENIN IV KIT
5.0000 | PACK | Freq: Once | INTRAVENOUS | Status: AC | PRN
  Administered 2020-10-28: 4.825 via INTRAVENOUS

## 2020-10-29 ENCOUNTER — Encounter: Payer: Self-pay | Admitting: Podiatry

## 2020-10-30 ENCOUNTER — Other Ambulatory Visit: Payer: 59

## 2020-11-03 ENCOUNTER — Other Ambulatory Visit: Payer: Self-pay

## 2020-11-03 ENCOUNTER — Encounter: Payer: Self-pay | Admitting: Internal Medicine

## 2020-11-03 ENCOUNTER — Other Ambulatory Visit: Payer: Self-pay | Admitting: Gastroenterology

## 2020-11-03 ENCOUNTER — Ambulatory Visit (INDEPENDENT_AMBULATORY_CARE_PROVIDER_SITE_OTHER): Payer: 59 | Admitting: Internal Medicine

## 2020-11-03 DIAGNOSIS — R1084 Generalized abdominal pain: Secondary | ICD-10-CM

## 2020-11-03 DIAGNOSIS — R109 Unspecified abdominal pain: Secondary | ICD-10-CM | POA: Insufficient documentation

## 2020-11-03 DIAGNOSIS — R1013 Epigastric pain: Secondary | ICD-10-CM

## 2020-11-03 DIAGNOSIS — R112 Nausea with vomiting, unspecified: Secondary | ICD-10-CM

## 2020-11-03 MED ORDER — GLIMEPIRIDE 2 MG PO TABS: 2.0000 mg | ORAL_TABLET | Freq: Every day | ORAL | 3 refills | Status: DC

## 2020-11-03 NOTE — Assessment & Plan Note (Signed)
With N/V, diarrhea and bloating Extensive GI testing is negative---GI doctor considering test for gastroparesis  I think (and so does patient) that this is the metformin Will stop this Start glimepiride instead at 2mg  daily and watch for hypoglycemia (and consider increase to 4mg ) Consider change onglyza to semaglutide as well

## 2020-11-03 NOTE — Progress Notes (Signed)
Subjective:    Patient ID: Shannon [name], female    DOB: 03/18/1956, 64 y.o.   MRN: [id number]  HPI Here due to ongoing GI symptoms This visit occurred during the SARS-CoV-2 public health emergency.  Safety protocols were in place, including screening questions prior to the visit, additional usage of staff PPE, and extensive cleaning of exam room while observing appropriate contact time as indicated for disinfecting solutions.   Has lots of nausea Occasional vomiting Nausea med didn't help This has happened 4-5 times in past month Having diarrhea at times as well  Sugars are not really good Fasting generally over 150 Night with insulin--it may be over 200 Taking 20 units of insulin nightly  Current Outpatient Medications on File Prior to Visit  Medication Sig Dispense Refill  . aspirin-acetaminophen-caffeine (EXCEDRIN MIGRAINE) 250-250-65 MG tablet Take 1 tablet by mouth every 6 (six) hours as needed for headache.    . BD PEN NEEDLE MICRO U/F 32G X 6 MM MISC USE AS DIRECTED DAILY. 100 each 6  . Blood Glucose Monitoring Suppl (FIFTY50 GLUCOSE METER 2.0) w/Device KIT OneTouch Ultra2 Meter kit    . cetirizine (ZYRTEC) 10 MG tablet Take 10 mg by mouth daily.     . Continuous Blood Gluc Sensor (FREESTYLE LIBRE 14 DAY SENSOR) MISC 1 each by Does not apply route every 14 (fourteen) days. DX Code E11.59 2 each 12  . docusate sodium (COLACE) 100 MG capsule Take 300 mg by mouth at bedtime.     . fluticasone (FLONASE) 50 MCG/ACT nasal spray Place 1 spray into both nostrils daily.    . insulin glargine (LANTUS SOLOSTAR) 100 UNIT/ML Solostar Pen Inject 20 Units into the skin daily. 30 mL 3  . LUMIGAN 0.01 % SOLN Place 1 drop into both eyes at bedtime.     . metFORMIN (GLUCOPHAGE) 1000 MG tablet Take 1 tablet (1,000 mg total) by mouth 2 (two) times daily with a meal. 180 tablet 3  . metoprolol succinate (TOPROL-XL) 25 MG 24 hr tablet Take 1.5 tablets (37.5 mg total) by mouth daily. 135 tablet 3   . MITIGARE 0.6 MG CAPS Take 1 capsule by mouth 2 (two) times daily as needed.    . ondansetron (ZOFRAN-ODT) 4 MG disintegrating tablet Take 4 mg by mouth every 8 (eight) hours as needed.    Glory Rosebush DELICA LANCETS FINE MISC Check blood sugar once daily and as needed. 250.00 100 each 1  . ONETOUCH ULTRA test strip USE TO CHECK BLOOD SUGAR 2  TIMES DAILY 200 strip 3  . pantoprazole (PROTONIX) 40 MG tablet TAKE 1 TABLET BY MOUTH 2  TIMES DAILY ON AN EMPTY  STOMACH (Patient taking differently: Take 40 mg by mouth 2 (two) times daily.) 180 tablet 3  . rosuvastatin (CRESTOR) 5 MG tablet TAKE 1 TABLET BY MOUTH 3  TIMES WEEKLY (Patient taking differently: Take 5 mg by mouth 3 (three) times a week.) 39 tablet 3  . saxagliptin HCl (ONGLYZA) 5 MG TABS tablet Take 1 tablet (5 mg total) by mouth daily. 90 tablet 3  . sucralfate (CARAFATE) 1 g tablet Take 1 tablet (1 g total) by mouth in the morning, at noon, and at bedtime. 270 tablet 3  . traZODone (DESYREL) 100 MG tablet TAKE 2 TABLETS BY MOUTH  EVERY NIGHT AT BEDTIME 180 tablet 3  . triamterene-hydrochlorothiazide (MAXZIDE-25) 37.5-25 MG tablet TAKE 1 TABLET BY MOUTH ONCE A DAY (Patient taking differently: Take 1 tablet by mouth daily.) 90 tablet 3  .  valACYclovir (VALTREX) 1000 MG tablet TAKE 2 TABLETS BY MOUTH  AT ONSET OF SYMPTOMS AS NEEDED FOR FEVER BLISTERS AND TAKE 2 TABLETS 12 HOURS LATER. 90 tablet 3   No current facility-administered medications on file prior to visit.    Allergies  Allergen Reactions  . Lisinopril Cough  . Nitrofurantoin Other (See Comments)    Pt unsure  . Oxycodone-Acetaminophen Itching  . Atorvastatin Diarrhea    May have caused colitis--but not clear cut  . Clarithromycin Palpitations  . Glipizide Other (See Comments)    Sugars too variable  . Invokana [Canagliflozin]     Nausea, constipation    Past Medical History:  Diagnosis Date  . Allergy   . Arthritis    hip, back, shoulders, joints  . Asthma   .  Complication of anesthesia    problems with block during breast augmentation and epidural during childbirth  . Depression   . Diabetes mellitus 11/12   type 2  . Family history of adverse reaction to anesthesia    PROBLEMS WITH BLOCKS  . Fatty liver   . GERD (gastroesophageal reflux disease)   . Glaucoma suspect   . Headache    migraines-4x a week headache-1x week migraine  . History of hiatal hernia   . Hyperlipidemia   . Hypertension    Controlled on meds  . Mitral valve prolapse   . 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 Osteopenia   . Pneumonia   . PONV (postoperative nausea and vomiting)   . Shingles 11/2006  . Sleep disturbance   . Tachycardia   . Vertigo    Hx of    Past Surgical History:  Procedure Laterality Date  . ABDOMINAL HYSTERECTOMY  2000   /BSO - abn. [facility]  . APPENDECTOMY  1978  . AUGMENTATION MAMMAPLASTY Bilateral 1981  . BLADDER REPAIR     x2  . BREAST ENHANCEMENT SURGERY  1987  . [place] Left 12/18/2016   Procedure: Left Heart Cath and Coronary Angiography;  Surgeon: Wellington Hampshire, MD;  Location: [facility];  Service: Cardiovascular;  Laterality: Left;  . CARPAL TUNNEL RELEASE Right   . CERVICAL DISCECTOMY  11/2009   with synthetic discs inserted--  Dr Arnoldo Morale  . COLONOSCOPY    . JOINT REPLACEMENT    . NM MYOVIEW LTD  03/2005   EF 56%, no ischemia  . RECTAL PROLAPSE REPAIR     x1  . SHOULDER ARTHROSCOPY WITH OPEN ROTATOR CUFF REPAIR Left 12/29/2019   Procedure: LEFT SHOULDER ARTHROSCOPIC DISTAL CLAVICLE EXCISISON, SUBACROMIAL DECOMPRESSION,BICEPS [place] ;  Surgeon: Lovell Sheehan, MD;  Location: [facility];  Service: Orthopedics;  Laterality: Left;  . SHOULDER CLOSED REDUCTION Left 05/03/2020   Procedure: Left shoulder arthroscopic lysis of adhesions and manipulation under anesthesia;  Surgeon: Lovell Sheehan, MD;  Location: [facility];  Service: Orthopedics;   Laterality: Left;  . SHOULDER SURGERY  09/2008, 01/2009   Right shoulder surgery, then redone with lysis of adhesions  . TOTAL HIP ARTHROPLASTY Right 05/29/2018   Procedure: TOTAL HIP ARTHROPLASTY ANTERIOR APPROACH;  Surgeon: Lovell Sheehan, MD;  Location: [facility];  Service: Orthopedics;  Laterality: Right;  . UPPER GI ENDOSCOPY      Family History  Problem Relation Age of Onset  . Diabetes Mother   . Heart disease Mother   . Hypertension Mother   . Ovarian cancer Mother   . Melanoma Mother   .  Cancer Father   . Coronary artery disease Father   . Heart disease Father   . Diabetes Sister   . Depression Sister   . Coronary artery disease Sister   . Heart disease Sister   . Rheum arthritis Maternal Aunt   . Breast cancer Maternal Grandmother   . Diabetes Maternal Grandmother   . Heart attack Paternal Uncle   . Heart attack Cousin   . Heart attack Paternal Uncle   . Diabetes Maternal Grandfather   . Colon cancer Neg Hx     Social History   Socioeconomic History  . Marital status: Married    Spouse name: Not on file  . Number of children: 5  . Years of education: Not on file  . Highest education level: Not on file  Occupational History  . Occupation: homemaker  Tobacco Use  . Smoking status: Never Smoker  . Smokeless tobacco: Never Used  Vaping Use  . Vaping Use: Never used  Substance and Sexual Activity  . Alcohol use: No  . Drug use: No  . Sexual activity: Not on file  Other Topics Concern  . Not on file  Social History Narrative   Home maker   Doesn't exercise      [name] sister         Social Determinants of Health   Financial Resource Strain: Not on file  Food Insecurity: Not on file  Transportation Needs: Not on file  Physical Activity: Not on file  Stress: Not on file  Social Connections: Not on file  Intimate Partner Violence: Not on file   Review of Systems Weight is stable Careful with diet    Objective:   Physical  Exam Constitutional:      Appearance: Normal appearance.  Neurological:     Mental Status: She is alert.            Assessment & Plan:

## 2020-11-03 NOTE — Patient Instructions (Signed)
Please stop the metformin. Start the glimepiride at 2mg  daily with breakfast. Watch for low sugar reactions. If your sugars are not much better in 1-2 weeks, and no low sugar reactions, increase to 4mg  daily. If your sugars are still high, we will need to increase the insulin.

## 2020-11-05 ENCOUNTER — Telehealth: Payer: Self-pay

## 2020-11-05 DIAGNOSIS — E1165 Type 2 diabetes mellitus with hyperglycemia: Secondary | ICD-10-CM

## 2020-11-05 NOTE — Telephone Encounter (Signed)
Patient was notified of MRI results.  She declined PT and asked if injection therapy would help then she stated she would wait to discuss treatment options with Dr. Milinda Pointer at her next scheduled visit on 11/08/20

## 2020-11-05 NOTE — Telephone Encounter (Signed)
-----   Message from Garrel Ridgel, [place] sent at 11/02/2020  7:47 AM EST ----- No tear of the achilles.  Plantar fasciitis is chronic and may go to PT if she wishes.

## 2020-11-08 ENCOUNTER — Ambulatory Visit (INDEPENDENT_AMBULATORY_CARE_PROVIDER_SITE_OTHER): Payer: 59 | Admitting: Podiatry

## 2020-11-08 ENCOUNTER — Ambulatory Visit (INDEPENDENT_AMBULATORY_CARE_PROVIDER_SITE_OTHER): Payer: 59 | Admitting: Dermatology

## 2020-11-08 ENCOUNTER — Other Ambulatory Visit: Payer: Self-pay

## 2020-11-08 ENCOUNTER — Encounter: Payer: Self-pay | Admitting: Podiatry

## 2020-11-08 DIAGNOSIS — L820 Inflamed seborrheic keratosis: Secondary | ICD-10-CM

## 2020-11-08 DIAGNOSIS — L720 Epidermal cyst: Secondary | ICD-10-CM | POA: Diagnosis not present

## 2020-11-08 DIAGNOSIS — L309 Dermatitis, unspecified: Secondary | ICD-10-CM

## 2020-11-08 DIAGNOSIS — M722 Plantar fascial fibromatosis: Secondary | ICD-10-CM

## 2020-11-08 DIAGNOSIS — D1800 Hemangioma unspecified site: Secondary | ICD-10-CM

## 2020-11-08 DIAGNOSIS — M7661 Achilles tendinitis, right leg: Secondary | ICD-10-CM

## 2020-11-08 DIAGNOSIS — Z872 Personal history of diseases of the skin and subcutaneous tissue: Secondary | ICD-10-CM

## 2020-11-08 DIAGNOSIS — L409 Psoriasis, unspecified: Secondary | ICD-10-CM | POA: Diagnosis not present

## 2020-11-08 DIAGNOSIS — L821 Other seborrheic keratosis: Secondary | ICD-10-CM

## 2020-11-08 DIAGNOSIS — B079 Viral wart, unspecified: Secondary | ICD-10-CM

## 2020-11-08 DIAGNOSIS — L814 Other melanin hyperpigmentation: Secondary | ICD-10-CM

## 2020-11-08 DIAGNOSIS — L578 Other skin changes due to chronic exposure to nonionizing radiation: Secondary | ICD-10-CM

## 2020-11-08 DIAGNOSIS — D229 Melanocytic nevi, unspecified: Secondary | ICD-10-CM

## 2020-11-08 MED ORDER — MOMETASONE FUROATE 0.1 % EX CREA
1.0000 | TOPICAL_CREAM | CUTANEOUS | 1 refills | Status: DC
Start: 2020-11-08 — End: 2021-09-16

## 2020-11-08 MED ORDER — CLOBETASOL PROPIONATE 0.05 % EX SOLN: 1.0000 "application " | CUTANEOUS | 0 refills | Status: DC

## 2020-11-08 MED ORDER — DEXAMETHASONE SODIUM PHOSPHATE 120 MG/30ML IJ SOLN
2.0000 mg | Freq: Once | INTRAMUSCULAR | Status: AC
Start: 2020-11-08 — End: 2020-11-08
  Administered 2020-11-08: 2 mg via INTRA_ARTICULAR

## 2020-11-08 MED ORDER — TRIAMCINOLONE ACETONIDE 40 MG/ML IJ SUSP
20.0000 mg | Freq: Once | INTRAMUSCULAR | Status: AC
  Administered 2020-11-08: 20 mg

## 2020-11-08 MED ORDER — MELOXICAM 7.5 MG PO TABS: 7.5000 mg | ORAL_TABLET | Freq: Every day | ORAL | 0 refills | Status: DC

## 2020-11-08 NOTE — Patient Instructions (Addendum)
Seborrheic Keratosis  What causes seborrheic keratoses? Seborrheic keratoses are harmless, common skin growths that first appear during adult life.  As time goes by, more growths appear.  Some people may develop a large number of them.  Seborrheic keratoses appear on both covered and uncovered body parts.  They are not caused by sunlight.  The tendency to develop seborrheic keratoses can be inherited.  They vary in color from skin-colored to gray, brown, or even black.  They can be either smooth or have a rough, warty surface.   Seborrheic keratoses are superficial and look as if they were stuck on the skin.  Under the microscope this type of keratosis looks like layers upon layers of skin.  That is why at times the top layer may seem to fall off, but the rest of the growth remains and re-grows.    Treatment Seborrheic keratoses do not need to be treated, but can easily be removed in the office.  Seborrheic keratoses often cause symptoms when they rub on clothing or jewelry.  Lesions can be in the way of shaving.  If they become inflamed, they can cause itching, soreness, or burning.  Removal of a seborrheic keratosis can be accomplished by freezing, burning, or surgery. If any spot bleeds, scabs, or grows rapidly, please return to have it checked, as these can be an indication of a skin cancer.      Pre-Operative Instructions  You are scheduled for a surgical procedure at [facility]. We recommend you read the following instructions. If you have any questions or concerns, please call the office at [phone].  1. Shower and wash the entire body with soap and water the day of your surgery paying special attention to cleansing at and around the planned surgery site.  2. Avoid aspirin or aspirin containing products at least fourteen (14) days prior to your surgical procedure and for at least one week (7 Days) after your surgical procedure. If you take aspirin on a regular basis for heart  disease or history of stroke or for any other reason, we may recommend you continue taking aspirin but please notify us if you take this on a regular basis. Aspirin can cause more bleeding to occur during surgery as well as prolonged bleeding and bruising after surgery.   3. Avoid other nonsteroidal pain medications at least one week prior to surgery and at least one week prior to your surgery. These include medications such as Ibuprofen (Motrin, Advil and Nuprin), Naprosyn, Voltaren, Relafen, etc. If medications are used for therapeutic reasons, please inform us as they can cause increased bleeding or prolonged bleeding during and bruising after surgical procedures.   4. Please advise [place] if you are taking any "blood thinner" medications such as Coumadin or Dipyridamole or Plavix or similar medications. These cause increased bleeding and prolonged bleeding during procedures and bruising after surgical procedures. We may have to consider discontinuing these medications briefly prior to and shortly after your surgery if safe to do so.   5. Please inform us of all medications you are currently taking. All medications that are taken regularly should be taken the day of surgery as you always do. Nevertheless, we need to be informed of what medications you are taking prior to surgery to know whether they will affect the procedure or cause any complications.   6. Please inform us of any medication allergies. Also inform us of whether you have allergies to Latex or rubber products or whether you have had  any adverse reaction to Lidocaine or Epinephrine.  7. Please inform us of any prosthetic or artificial body parts such as artificial heart valve, joint replacements, etc., or similar condition that might require preoperative antibiotics.   8. We recommend avoidance of alcohol at least two weeks prior to surgery and continued avoidance for at least two weeks after surgery.   9. We recommend discontinuation of  tobacco smoking at least two weeks prior to surgery and continued abstinence for at least two weeks after surgery.  10. Do not plan strenuous exercise, strenuous work or strenuous lifting for approximately four weeks after your surgery.   11. We request if you are unable to make your scheduled surgical appointment, please call us at least a week in advance or as soon as you are aware of a problem so that we can cancel or reschedule the appointment.   12. You MAY TAKE TYLENOL (acetaminophen) for pain as it is not a blood thinner.   13. PLEASE PLAN TO BE IN TOWN FOR TWO WEEKS FOLLOWING SURGERY, THIS IS IMPORTANT SO YOU CAN BE CHECKED FOR DRESSING CHANGES, SUTURE REMOVAL AND TO MONITOR FOR POSSIBLE COMPLICATIONS.  14.

## 2020-11-08 NOTE — Progress Notes (Signed)
New Patient Visit  Subjective  Shannon [name] is a 65 y.o. female who presents for the following: check knot (L leg ~61m), check spots (Legs, itchy prn), Nevus (back), Psoriasis (Scalp, used Taclonex and clobetasol in past), and Dermatitis (Fingers, painful). She has an itchy bump on her back.  She also has a wart on her finger.  She has h/o AKs chest.   The following portions of the chart were reviewed this encounter and updated as appropriate:       Review of Systems:  No other skin or systemic complaints except as noted in HPI or Assessment and Plan.  Objective  Well appearing patient in no apparent distress; mood and affect are within normal limits.  A focused examination was performed including bil legs, back, chest, hands, scalp. Relevant physical exam findings are noted in the Assessment and Plan.  Objective  R lat calf x 2, L lat calf x 1, R hip x 2 L med thigh x 1 (6): Erythematous keratotic or waxy stuck-on papule  Objective  Right 3rd Finger Tip x 1: 3.66mm verrucous pap  Objective  Right Lower Back: Firm SQ nodule  Objective  Scalp: Pink patch with silvery scale  Objective  R 3rd fingertip DIP: Light pink scaly patch with fissure   Assessment & Plan  Inflamed seborrheic keratosis (6) R lat calf x 2, L lat calf x 1, R hip x 2 L med thigh x 1  Destruction of lesion - R lat calf x 2, L lat calf x 1, R hip x 2 L med thigh x 1  Destruction method: cryotherapy   Informed consent: discussed and consent obtained   Lesion destroyed using liquid nitrogen: Yes   Region frozen until ice ball extended beyond lesion: Yes   Outcome: patient tolerated procedure well with no complications   Post-procedure details: wound care instructions given    Viral warts, unspecified type Right 3rd Finger Tip x 1  Discussed viral etiology and risk of spread.  Discussed multiple treatments may be required to clear warts.  Discussed possible post-treatment dyspigmentation and  risk of recurrence.   Destruction of lesion - Right 3rd Finger Tip x 1  Destruction method: cryotherapy   Informed consent: discussed and consent obtained   Lesion destroyed using liquid nitrogen: Yes   Region frozen until ice ball extended beyond lesion: Yes   Outcome: patient tolerated procedure well with no complications   Post-procedure details: wound care instructions given    Epidermal cyst Right Lower Back  Cyst with symptoms and/or recent change.  Discussed surgical excision to remove, including resulting scar and possible recurrence.  Patient will schedule for surgery. Pre-op information given.   Psoriasis Scalp  Chronic condition with mild flare  Restart Clobetasol sol 1-2 gtts qd/bid aa scalp prn flares  clobetasol (TEMOVATE) 0.05 % external solution - Scalp  Hand dermatitis R 3rd fingertip DIP  Start Mometasone cr qd/bid prn flares  Hand Dermatitis is a chronic type of eczema that can come and go on the hands and fingers.  While there is no cure, the rash and symptoms can be managed with topical prescription medications, and for more severe cases, with systemic medications.  Recommend mild soap and routine use of moisturizing cream after handwashing.  Minimize soap/water exposure when possible.     mometasone (ELOCON) 0.1 % cream - R 3rd fingertip DIP   Seborrheic Keratoses - Stuck-on, waxy, tan-brown papules and plaques  - Discussed benign etiology and prognosis. -  Observe - Call for any changes  Lentigines - Scattered tan macules - Discussed due to sun exposure - Benign, observe - Recommend daily broad spectrum sunscreen SPF 30+ to sun-exposed areas, reapply every 2 hours as needed. - Call for any changes  Actinic Damage - chronic, secondary to cumulative UV radiation exposure/sun exposure over time - diffuse scaly erythematous macules with underlying dyspigmentation - Recommend daily broad spectrum sunscreen SPF 30+ to sun-exposed areas, reapply every  2 hours as needed.  - Call for new or changing lesions.  Melanocytic Nevi - Tan-brown and/or pink-flesh-colored symmetric macules and papules - Benign appearing on exam today - Observation - Call clinic for new or changing moles - Recommend daily use of broad spectrum spf 30+ sunscreen to sun-exposed areas.   Hemangiomas - Red papules - Discussed benign nature - Observe - Call for any changes  History of PreCancerous Actinic Keratosis chest - site(s) of PreCancerous Actinic Keratosis clear today. - these may recur and new lesions may form requiring treatment to prevent transformation into skin cancer - observe for new or changing spots and contact [place] for appointment if occur - photoprotection with sun protective clothing; sunglasses and broad spectrum sunscreen with SPF of at least 30 + and frequent self skin exams recommended - yearly exams by a dermatologist recommended for persons with history of PreCancerous Actinic Keratoses  Return for to be scheduled for cyst back.   I, Othelia Pulling, RMA, am acting as scribe for Brendolyn Patty, MD . Documentation: I have reviewed the above documentation for accuracy and completeness, and I agree with the above.  Brendolyn Patty MD

## 2020-11-08 NOTE — Progress Notes (Signed)
She presents today for follow-up of her MRI.  She states that her fasciitis and her Achilles is still painful.  Objective: Signs are stable she is alert oriented x3 still with pain on palpation lateral calcaneal tubercle right foot.  And Achilles right heel.  Assessment plan fasciitis lateral Achilles tendinitis insertion.  Plan: Discussed her MRI findings with her today which consisted of plantar fasciitis and tendinitis.  I had previously recommended physical therapy but she does not want to go at this point.  So I reinjected her today 2 mg of dexamethasone at the insertion site of the Achilles making sure not to inject into the tendon itself.  Also injected the plantar fashion today 20 mg Kenalog 5 mg Marcaine to the point of maximal tenderness laterally.  Follow-up with me after the first of the year as needed.

## 2020-11-09 MED ORDER — GLIMEPIRIDE 4 MG PO TABS: 4.0000 mg | ORAL_TABLET | Freq: Every day | ORAL | 0 refills | Status: DC

## 2020-11-09 MED ORDER — GLIMEPIRIDE 4 MG PO TABS: 4.0000 mg | ORAL_TABLET | Freq: Every day | ORAL | 3 refills | Status: DC

## 2020-11-09 NOTE — Addendum Note (Signed)
Addended by: Brendolyn Patty on: 11/09/2020 09:57 AM   Modules accepted: Level of Service

## 2020-11-11 ENCOUNTER — Other Ambulatory Visit: Payer: Self-pay

## 2020-11-11 ENCOUNTER — Encounter: Payer: Self-pay | Admitting: Emergency Medicine

## 2020-11-11 DIAGNOSIS — R5381 Other malaise: Secondary | ICD-10-CM | POA: Diagnosis not present

## 2020-11-11 DIAGNOSIS — E1165 Type 2 diabetes mellitus with hyperglycemia: Secondary | ICD-10-CM | POA: Diagnosis present

## 2020-11-11 DIAGNOSIS — Z96641 Presence of right artificial hip joint: Secondary | ICD-10-CM | POA: Insufficient documentation

## 2020-11-11 DIAGNOSIS — E1149 Type 2 diabetes mellitus with other diabetic neurological complication: Secondary | ICD-10-CM | POA: Diagnosis not present

## 2020-11-11 DIAGNOSIS — Z79899 Other long term (current) drug therapy: Secondary | ICD-10-CM | POA: Diagnosis not present

## 2020-11-11 DIAGNOSIS — Z7982 Long term (current) use of aspirin: Secondary | ICD-10-CM | POA: Insufficient documentation

## 2020-11-11 DIAGNOSIS — Z7984 Long term (current) use of oral hypoglycemic drugs: Secondary | ICD-10-CM | POA: Diagnosis not present

## 2020-11-11 DIAGNOSIS — R079 Chest pain, unspecified: Secondary | ICD-10-CM | POA: Insufficient documentation

## 2020-11-11 DIAGNOSIS — I10 Essential (primary) hypertension: Secondary | ICD-10-CM | POA: Insufficient documentation

## 2020-11-11 DIAGNOSIS — J45909 Unspecified asthma, uncomplicated: Secondary | ICD-10-CM | POA: Diagnosis not present

## 2020-11-11 DIAGNOSIS — Z8616 Personal history of COVID-19: Secondary | ICD-10-CM | POA: Insufficient documentation

## 2020-11-11 DIAGNOSIS — Z794 Long term (current) use of insulin: Secondary | ICD-10-CM | POA: Insufficient documentation

## 2020-11-11 LAB — URINALYSIS, COMPLETE (UACMP) WITH MICROSCOPIC
Bacteria, UA: NONE SEEN
Bilirubin Urine: NEGATIVE
Glucose, UA: >=500 mg/dL — AB
Hgb urine dipstick: NEGATIVE
Ketones, ur: NEGATIVE mg/dL
Leukocytes,Ua: NEGATIVE
Nitrite: NEGATIVE
Protein, ur: NEGATIVE mg/dL
Specific Gravity, Urine: 1.017 (ref 1.005–1.030)
Squamous Epithelial / HPF: NONE SEEN (ref 0–5)
pH: 6 (ref 5.0–8.0)

## 2020-11-11 LAB — BASIC METABOLIC PANEL
Anion gap: 12 (ref 5–15)
BUN: 23 mg/dL (ref 8–23)
CO2: 25 mmol/L (ref 22–32)
Calcium: 9.6 mg/dL (ref 8.9–10.3)
Chloride: 97 mmol/L — ABNORMAL LOW (ref 98–111)
Creatinine, Ser: 1.14 mg/dL — ABNORMAL HIGH (ref 0.44–1.00)
GFR, Estimated: 54 mL/min — ABNORMAL LOW (ref >60)
Glucose, Bld: 547 mg/dL (ref 70–99)
Potassium: 4 mmol/L (ref 3.5–5.1)
Sodium: 134 mmol/L — ABNORMAL LOW (ref 135–145)

## 2020-11-11 LAB — CBC
HCT: 36.9 % (ref 36.0–46.0)
Hemoglobin: 12.1 g/dL (ref 12.0–15.0)
MCH: 26.8 pg (ref 26.0–34.0)
MCHC: 32.8 g/dL (ref 30.0–36.0)
MCV: 81.8 fL (ref 80.0–100.0)
Platelets: 283 10*3/uL (ref 150–400)
RBC: 4.51 MIL/uL (ref 3.87–5.11)
RDW: 14.1 % (ref 11.5–15.5)
WBC: 9.1 10*3/uL (ref 4.0–10.5)
nRBC: 0 % (ref 0.0–0.2)

## 2020-11-11 LAB — CBG MONITORING, ED
Glucose-Capillary: 534 mg/dL (ref 70–99)
Glucose-Capillary: 446 mg/dL — ABNORMAL HIGH (ref 70–99)

## 2020-11-11 MED ORDER — SODIUM CHLORIDE 0.9 % IV BOLUS
1000.0000 mL | Freq: Once | INTRAVENOUS | Status: AC
  Administered 2020-11-11: 1000 mL via INTRAVENOUS

## 2020-11-11 MED ORDER — ACETAMINOPHEN 325 MG PO TABS
650.0000 mg | ORAL_TABLET | Freq: Once | ORAL | Status: AC
  Administered 2020-11-11: 650 mg via ORAL
  Filled 2020-11-11: qty 2

## 2020-11-11 NOTE — Addendum Note (Signed)
Addended by: Viviana Simpler I on: 11/11/2020 12:39 PM   Modules accepted: Orders

## 2020-11-11 NOTE — ED Triage Notes (Signed)
Pt presents to ER from home with chest pain that started 3 hrs ago, reports tightness, pressure on chest, denies any other symptom, reports her blood has been running higher every day, reports today her Free style read High and she feels tired. Pt talks in complete sentences no respiratory distress noted, pt denies any other symptom.

## 2020-11-12 ENCOUNTER — Emergency Department
Admission: EM | Admit: 2020-11-12 | Discharge: 2020-11-12 | Disposition: A | Discharge status: Home / Self Care | Payer: 59 | Attending: Emergency Medicine | Admitting: Emergency Medicine

## 2020-11-12 DIAGNOSIS — R739 Hyperglycemia, unspecified: Secondary | ICD-10-CM

## 2020-11-12 LAB — CBG MONITORING, ED
Glucose-Capillary: 414 mg/dL — ABNORMAL HIGH (ref 70–99)
Glucose-Capillary: 332 mg/dL — ABNORMAL HIGH (ref 70–99)
Glucose-Capillary: 251 mg/dL — ABNORMAL HIGH (ref 70–99)

## 2020-11-12 LAB — TROPONIN I (HIGH SENSITIVITY): Troponin I (High Sensitivity): 4 ng/L (ref <18)

## 2020-11-12 MED ORDER — BUTALBITAL-APAP-CAFFEINE 50-325-40 MG PO TABS
1.0000 | ORAL_TABLET | Freq: Once | ORAL | Status: AC
  Administered 2020-11-12: 1 via ORAL
  Filled 2020-11-12: qty 1

## 2020-11-12 MED ORDER — INSULIN GLARGINE 100 UNIT/ML ~~LOC~~ SOLN
35.0000 [IU] | Freq: Once | SUBCUTANEOUS | Status: DC
  Filled 2020-11-12: qty 0.35

## 2020-11-12 MED ORDER — SODIUM CHLORIDE 0.9 % IV BOLUS
1000.0000 mL | Freq: Once | INTRAVENOUS | Status: AC
  Administered 2020-11-12: 1000 mL via INTRAVENOUS

## 2020-11-12 MED ORDER — ASPIRIN-ACETAMINOPHEN-CAFFEINE 250-250-65 MG PO TABS: 1.0000 | ORAL_TABLET | Freq: Once | ORAL | Status: DC

## 2020-11-12 NOTE — Discharge Instructions (Signed)
You may increase nightly dose of Lantus to 40 units if you are comfortable.  Return to the ER for worsening symptoms, persistent vomiting, difficulty breathing or other concerns.

## 2020-11-12 NOTE — ED Notes (Signed)
Patient given DC paperwork. Patient states understanding on instructions. Patient gave this RN verbal consent for DC.

## 2020-11-12 NOTE — ED Provider Notes (Signed)
[facility] Emergency Department Provider Note   ____________________________________________   Event Date/Time   First MD Initiated Contact with Patient 11/12/20 0045     (approximate)  I have reviewed the triage vital signs and the nursing notes.   HISTORY  Chief Complaint Hyperglycemia and Chest Pain    HPI AIANNA FAHS is a 64 y.o. female who presents to the ED from home with a chief complaint of high blood sugar, generalized malaise and chest pain.  Patient states she has been having increasing blood sugars and GI intolerance to Metformin.  This was switched to Amaryl 2 weeks ago, initially at 2 mg and now at 4 mg.  Her Lantus has also been increased, now at 35 units at bedtime for the past 6 to 7 days.  She also takes Onglyza daily.  States her bedtime sugars are still running between 300-400 since this change.  Tonight she felt rundown with some chest tightness, checked her sugar which was greater than 600 on her monitor.  Denies fever, cough, shortness of breath, abdominal pain, nausea, vomiting, dysuria or diarrhea.  Patient is vaccinated against [id number] and denies exposure.     Past Medical History:  Diagnosis Date  . Allergy   . Arthritis    hip, back, shoulders, joints  . Asthma   . Complication of anesthesia    problems with block during breast augmentation and epidural during childbirth  . Depression   . Diabetes mellitus 11/12   type 2  . Family history of adverse reaction to anesthesia    PROBLEMS WITH BLOCKS  . Fatty liver   . GERD (gastroesophageal reflux disease)   . Glaucoma suspect   . Headache    migraines-4x a week headache-1x week migraine  . History of hiatal hernia   . Hyperlipidemia   . Hypertension    Controlled on meds  . Mitral valve prolapse   . 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 Osteopenia   . Pneumonia   . PONV (postoperative nausea and vomiting)   . Shingles 11/2006  . Sleep disturbance   .  Tachycardia   . Vertigo    Hx of    Patient Active Problem List   Diagnosis Date Noted  . Abdominal pain 11/03/2020  . 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 COVID-19 virus infection 03/03/2020  . History of arthroscopic procedure on shoulder 01/09/2020  . UTI (urinary tract infection) 09/09/2019  . Gouty arthritis of right great toe 07/04/2019  . Contracture of finger joint, left 07/04/2019  . Status post total hip replacement, right 05/29/2018  . Headache 05/20/2018  . Low back pain 06/18/2017  . Varicella zoster 07/10/2016  . 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 Type 2 diabetes mellitus with hyperglycemia ([place]) 11/04/2014  . Colitis 03/17/2014  . Noninfective gastroenteritis and colitis 03/17/2014  . Routine general medical examination at a health care facility 09/16/2012  . Mixed hyperlipidemia   . Type 2 diabetes mellitus with neurological manifestations, controlled ([place])   . FIBROCYSTIC BREAST DISEASE 06/12/2007  . GLAUCOMA 05/30/2007  . Essential hypertension, benign 05/30/2007  . MITRAL VALVE PROLAPSE 05/30/2007  . ASTHMA 05/30/2007  . OSTEOPENIA 05/30/2007  . Glaucoma 05/30/2007  . Mitral valve disorder 05/30/2007  . ALLERGIC RHINITIS 03/20/2007  . GERD 03/20/2007  . Sleep disturbance 03/20/2007    Past Surgical History:  Procedure Laterality Date  . ABDOMINAL HYSTERECTOMY  2000   /BSO - abn. [facility]  . APPENDECTOMY  1978  .  AUGMENTATION MAMMAPLASTY Bilateral 1981  . BLADDER REPAIR     x2  . BREAST ENHANCEMENT SURGERY  1987  . [place] Left 12/18/2016   Procedure: Left Heart Cath and Coronary Angiography;  Surgeon: Wellington Hampshire, MD;  Location: [place] [facility];  Service: Cardiovascular;  Laterality: Left;  . CARPAL TUNNEL RELEASE Right   . CERVICAL DISCECTOMY  11/2009   with synthetic discs inserted--  Dr Arnoldo Morale  . COLONOSCOPY    . JOINT REPLACEMENT    . NM MYOVIEW LTD  03/2005   EF 56%,  no ischemia  . RECTAL PROLAPSE REPAIR     x1  . SHOULDER ARTHROSCOPY WITH OPEN ROTATOR CUFF REPAIR Left 12/29/2019   Procedure: LEFT SHOULDER ARTHROSCOPIC DISTAL CLAVICLE EXCISISON, SUBACROMIAL DECOMPRESSION,BICEPS [place] ;  Surgeon: Lovell Sheehan, MD;  Location: [facility];  Service: Orthopedics;  Laterality: Left;  . SHOULDER CLOSED REDUCTION Left 05/03/2020   Procedure: Left shoulder arthroscopic lysis of adhesions and manipulation under anesthesia;  Surgeon: Lovell Sheehan, MD;  Location: [facility];  Service: Orthopedics;  Laterality: Left;  . SHOULDER SURGERY  09/2008, 01/2009   Right shoulder surgery, then redone with lysis of adhesions  . TOTAL HIP ARTHROPLASTY Right 05/29/2018   Procedure: TOTAL HIP ARTHROPLASTY ANTERIOR APPROACH;  Surgeon: Lovell Sheehan, MD;  Location: [facility];  Service: Orthopedics;  Laterality: Right;  . UPPER GI ENDOSCOPY      Prior to Admission medications   Medication Sig Start Date End Date Taking? Authorizing Provider  aspirin-acetaminophen-caffeine (EXCEDRIN MIGRAINE) [phone] MG tablet Take 1 tablet by mouth every 6 (six) hours as needed for headache.    [provider]  BD PEN NEEDLE MICRO U/F 32G X 6 MM MISC USE AS DIRECTED DAILY. 05/03/20   Venia Carbon, MD  Blood Glucose Monitoring Suppl (FIFTY50 GLUCOSE METER 2.0) w/Device KIT OneTouch Ultra2 Meter kit    [provider]  cetirizine (ZYRTEC) 10 MG tablet Take 10 mg by mouth daily.     [provider]  clobetasol (TEMOVATE) 0.05 % external solution Apply 1 application topically as directed. 1-2 gtts qd/bid aa scalp prn flares, avoid face, groin, axilla 11/08/20   Brendolyn Patty, MD  Continuous Blood Gluc Sensor (FREESTYLE LIBRE 14 DAY SENSOR) MISC 1 each by Does not apply route every 14 (fourteen) days. DX Code E11.59 10/06/20   Viviana Simpler I, MD  docusate sodium (COLACE) 100 MG capsule Take 300 mg by mouth at bedtime.     [provider]   fluticasone (FLONASE) 50 MCG/ACT nasal spray Place 1 spray into both nostrils daily.    [provider]  glimepiride (AMARYL) 4 MG tablet Take 1 tablet (4 mg total) by mouth daily before breakfast. 11/09/20   Venia Carbon, MD  insulin glargine (LANTUS SOLOSTAR) 100 UNIT/ML Solostar Pen Inject 20 Units into the skin daily. 10/11/20   Venia Carbon, MD  LUMIGAN 0.01 % SOLN Place 1 drop into both eyes at bedtime.  10/13/15   [provider]  meloxicam (MOBIC) 7.5 MG tablet Take 1 tablet (7.5 mg total) by mouth daily. 11/08/20   Hyatt, Max T, DPM  metoprolol succinate (TOPROL-XL) 25 MG 24 hr tablet Take 1.5 tablets (37.5 mg total) by mouth daily. 03/10/20   Venia Carbon, MD  MITIGARE 0.6 MG CAPS Take 1 capsule by mouth 2 (two) times daily as needed. 05/18/20   [provider]  mometasone (ELOCON) 0.1 % cream Apply 1 application topically as directed. Qd to bid aa hands prn flares 11/08/20   Brendolyn Patty, MD  ondansetron (ZOFRAN-ODT) 4 MG disintegrating tablet Take 4 mg by mouth every 8 (eight) hours as needed. 07/16/20   [provider]  Jonetta Speak LANCETS FINE MISC Check blood sugar once daily and as needed. 250.00 05/26/14   Venia Carbon, MD  ONETOUCH ULTRA test strip USE TO CHECK BLOOD SUGAR 2  TIMES DAILY 11/24/19   Venia Carbon, MD  pantoprazole (PROTONIX) 40 MG tablet TAKE 1 TABLET BY MOUTH 2  TIMES DAILY ON AN EMPTY  STOMACH Patient taking differently: Take 40 mg by mouth 2 (two) times daily. 03/10/20   Venia Carbon, MD  rosuvastatin (CRESTOR) 5 MG tablet TAKE 1 TABLET BY MOUTH 3  TIMES WEEKLY Patient taking differently: Take 5 mg by mouth 3 (three) times a week. 11/24/19   Venia Carbon, MD  saxagliptin HCl (ONGLYZA) 5 MG TABS tablet Take 1 tablet (5 mg total) by mouth daily. 10/11/20   Venia Carbon, MD  sucralfate (CARAFATE) 1 g tablet Take 1 tablet (1 g total) by mouth in the morning, at noon, and at bedtime. 10/11/20    Venia Carbon, MD  traZODone (DESYREL) 100 MG tablet TAKE 2 TABLETS BY MOUTH  EVERY NIGHT AT BEDTIME 06/29/20   Venia Carbon, MD  triamterene-hydrochlorothiazide (YHCWCBJ-62) 37.5-25 MG tablet TAKE 1 TABLET BY MOUTH ONCE A DAY Patient taking differently: Take 1 tablet by mouth daily. 11/24/19   Venia Carbon, MD  valACYclovir (VALTREX) 1000 MG tablet TAKE 2 TABLETS BY MOUTH  AT ONSET OF SYMPTOMS AS NEEDED FOR FEVER BLISTERS AND TAKE 2 TABLETS 12 HOURS LATER. 07/07/20   Venia Carbon, MD    Allergies Lisinopril, Nitrofurantoin, Oxycodone-acetaminophen, Atorvastatin, Clarithromycin, Glipizide, and Invokana [canagliflozin]  Family History  Problem Relation Age of Onset  . Diabetes Mother   . Heart disease Mother   . Hypertension Mother   . Ovarian cancer Mother   . Melanoma Mother   . Cancer Father   . Coronary artery disease Father   . Heart disease Father   . Diabetes Sister   . Depression Sister   . Coronary artery disease Sister   . Heart disease Sister   . Rheum arthritis Maternal Aunt   . Breast cancer Maternal Grandmother   . Diabetes Maternal Grandmother   . Heart attack Paternal Uncle   . Heart attack Cousin   . Heart attack Paternal Uncle   . Diabetes Maternal Grandfather   . Colon cancer Neg Hx     Social History Social History   Tobacco Use  . Smoking status: Never Smoker  . Smokeless tobacco: Never Used  Vaping Use  . Vaping Use: Never used  Substance Use Topics  . Alcohol use: No  . Drug use: No    Review of Systems  Constitutional: Positive for fatigue and generalized malaise.  No fever/chills Eyes: No visual changes. ENT: No sore throat. Cardiovascular: Positive for chest pain. Respiratory: Denies shortness of breath. Gastrointestinal: No abdominal pain.  No nausea, no vomiting.  No diarrhea.  No constipation. Genitourinary: Negative for dysuria. Musculoskeletal: Negative for back pain. Skin: Negative for rash. Neurological: Negative  for headaches, focal weakness or numbness.   ____________________________________________   PHYSICAL EXAM:  VITAL SIGNS: ED Triage Vitals  Enc Vitals Group     BP 11/11/20 1917 (!) 147/74     Pulse Rate  11/11/20 1917 100     Resp 11/11/20 1917 20     Temp 11/11/20 1917 98.1 F (36.7 C)     Temp Source 11/11/20 1917 Oral     SpO2 11/11/20 1917 99 %     Weight 11/11/20 1918 146 lb (66.2 kg)     Height 11/11/20 1918 '5\' 4"'  (1.626 m)     Head Circumference --      Peak Flow --      Pain Score 11/11/20 1917 4     Pain Loc --      Pain Edu? --      Excl. in [place]? --     Constitutional: Alert and oriented. Well appearing and in no acute distress. Eyes: Conjunctivae are normal. PERRL. EOMI. Head: Atraumatic. Nose: No congestion/rhinnorhea. Mouth/Throat: Mucous membranes are moist.   Neck: No stridor.   Cardiovascular: Normal rate, regular rhythm. Grossly normal heart sounds.  Good peripheral circulation. Respiratory: Normal respiratory effort.  No retractions. Lungs CTAB. Gastrointestinal: Soft and nontender to light or deep palpation. No distention. No abdominal bruits. No CVA tenderness. Musculoskeletal: No lower extremity tenderness nor edema.  No joint effusions. Neurologic: Alert and oriented x3.  CN II XII grossly intact.  Normal speech and language. No gross focal neurologic deficits are appreciated. No gait instability. Skin:  Skin is warm, dry and intact. No rash noted. Psychiatric: Mood and affect are normal. Speech and behavior are normal.  ____________________________________________   LABS (all labs ordered are listed, but only abnormal results are displayed)  Labs Reviewed  BASIC METABOLIC PANEL - Abnormal; Notable for the following components:      Result Value   Sodium 134 (*)    Chloride 97 (*)    Glucose, Bld 547 (*)    Creatinine, Ser 1.14 (*)    GFR, Estimated 54 (*)    All other components within normal limits  URINALYSIS, COMPLETE (UACMP) WITH  MICROSCOPIC - Abnormal; Notable for the following components:   Color, Urine STRAW (*)    APPearance CLEAR (*)    Glucose, UA >=500 (*)    All other components within normal limits  CBG MONITORING, ED - Abnormal; Notable for the following components:   Glucose-Capillary 414 (*)    All other components within normal limits  CBG MONITORING, ED - Abnormal; Notable for the following components:   Glucose-Capillary 534 (*)    All other components within normal limits  CBG MONITORING, ED - Abnormal; Notable for the following components:   Glucose-Capillary 446 (*)    All other components within normal limits  CBG MONITORING, ED - Abnormal; Notable for the following components:   Glucose-Capillary 332 (*)    All other components within normal limits  CBG MONITORING, ED - Abnormal; Notable for the following components:   Glucose-Capillary 251 (*)    All other components within normal limits  CBC  TROPONIN I (HIGH SENSITIVITY)   ____________________________________________  EKG  ED ECG REPORT I, Dilraj Killgore J, the attending physician, personally viewed and interpreted this ECG.   Date: 11/12/2020  EKG Time: 1900  Rate: 101  Rhythm: sinus tachycardia  Axis: Normal  Intervals:none  ST&T Change: Nonspecific  ____________________________________________  RADIOLOGY I, Emelly Wurtz J, personally viewed and evaluated these images (plain radiographs) as part of my medical decision making, as well as reviewing the written report by the radiologist.  ED MD interpretation: None  Official radiology report(s): No results found.  ____________________________________________   PROCEDURES  Procedure(s) performed (including Critical Care):  Procedures   ____________________________________________   INITIAL IMPRESSION / ASSESSMENT AND PLAN / ED COURSE  As part of my medical decision making, I reviewed the following data within the [place] notes reviewed and  incorporated, Labs reviewed, EKG interpreted, Old chart reviewed and Notes from prior ED visits     64 year old female with diabetes presenting with hyperglycemia.  Differential diagnosis includes but is not limited to infectious, metabolic, compliance etiologies, etc.  Patient reports compliance with her medicines; has not taking her Lantus tonight due to being in the ED waiting room.  On review of patient's chart, I see that she received a steroid injection to her Achilles heel just 4 days ago which may account for some of the acute spike of her blood sugar, although patient did state it was already increasing before the steroid shot.  She has an appointment with endocrinology January 3.  After 1 L normal saline, glucose decreased from 547 to 446.  Will add another liter normal saline, administer patient's nighttime dose of Lantus.  We did discuss increasing her Lantus to 40 units nightly as she waits for her endocrinology appointment.  Clinical Course as of 11/12/20 0418  Fri Nov 12, 2020  0248 Patient was scared to take her nighttime Lantus dose given her blood sugars have decreased nicely.  I did suggest increasing her Lantus to 40 units nightly if she is comfortable to help her blood sugars until her endocrinology appointment in early January.  Strict return precautions given.  Patient verbalizes understanding and agrees with plan of care. [JS]    Clinical Course User Index [JS] Paulette Blanch, MD     ____________________________________________   FINAL CLINICAL IMPRESSION(S) / ED DIAGNOSES  Final diagnoses:  Hyperglycemia     ED Discharge Orders    None      *Please note:  ANALISSA BAYLESS was evaluated in Emergency Department on 11/12/2020 for the symptoms described in the history of present illness. She was evaluated in the context of the global COVID-19 pandemic, which necessitated consideration that the patient might be at risk for infection with the SARS-CoV-2 virus that  causes COVID-19. Institutional protocols and algorithms that pertain to the evaluation of patients at risk for COVID-19 are in a state of rapid change based on information released by regulatory bodies including the CDC and federal and state organizations. These policies and algorithms were followed during the patient's care in the ED.  Some ED evaluations and interventions may be delayed as a result of limited staffing during and the pandemic.*   Note:  This document was prepared using Dragon voice recognition software and may include unintentional dictation errors.   Paulette Blanch, MD 11/12/20 [phone]

## 2020-11-12 NOTE — ED Notes (Signed)
Patient expressed concerns over taking the 35u of insulin. Patient states "I am very sensitive to insulin and my blood sugar will drop". EDP informed.

## 2020-11-12 NOTE — ED Notes (Signed)
Patient's daughter called concerned about patient.  After receiving permission from patient, patients daughter updated by this RN. Patients daughter concerned about patient previously being in subwait "and not being monitored properly". Daughter reassured by this RN that patient had been monitored, had frequent CBG checks and received fluids. Patients daughter states understanding about wait times and that her mother has been moved to a room in the ED and is being assessed by the EDP at this time.

## 2020-11-12 NOTE — ED Notes (Signed)
Patient resting in stretcher. Cardiac monitor on. Pt states "I feel tightness in my chest" 4/10, pt states L arm was hurting earlier but denies any radiation at this time.  Pt c/o headache.

## 2020-11-15 ENCOUNTER — Other Ambulatory Visit: Payer: Self-pay

## 2020-11-15 MED ORDER — ONDANSETRON 4 MG PO TBDP: 4.0000 mg | ORAL_TABLET | Freq: Three times a day (TID) | ORAL | 5 refills | Status: DC | PRN

## 2020-11-16 ENCOUNTER — Encounter: Payer: Self-pay | Admitting: Internal Medicine

## 2020-11-16 ENCOUNTER — Ambulatory Visit (INDEPENDENT_AMBULATORY_CARE_PROVIDER_SITE_OTHER): Payer: 59 | Admitting: Internal Medicine

## 2020-11-16 ENCOUNTER — Other Ambulatory Visit: Payer: Self-pay

## 2020-11-16 VITALS — BP 140/90 | HR 94 | Ht 64.0 in | Wt 148.2 lb

## 2020-11-16 DIAGNOSIS — E1165 Type 2 diabetes mellitus with hyperglycemia: Secondary | ICD-10-CM

## 2020-11-16 DIAGNOSIS — E1142 Type 2 diabetes mellitus with diabetic polyneuropathy: Secondary | ICD-10-CM

## 2020-11-16 LAB — POCT GLYCOSYLATED HEMOGLOBIN (HGB A1C): Hemoglobin A1C: 9 % — AB (ref 4.0–5.6)

## 2020-11-16 MED ORDER — LYUMJEV KWIKPEN 100 UNIT/ML ~~LOC~~ SOPN: 8.0000 [IU] | PEN_INJECTOR | Freq: Three times a day (TID) | SUBCUTANEOUS | 3 refills | Status: DC

## 2020-11-16 MED ORDER — INSULIN PEN NEEDLE 32G X 4 MM MISC: 3 refills | Status: DC

## 2020-11-16 MED ORDER — LANTUS SOLOSTAR 100 UNIT/ML ~~LOC~~ SOPN: 26.0000 [IU] | PEN_INJECTOR | Freq: Every day | SUBCUTANEOUS | 3 refills | Status: DC

## 2020-11-16 NOTE — Patient Instructions (Addendum)
Please continue: - Onglyza 5 mg before breakfast  Please decrease to  - Lantus 26 units at bedtime  Please add: - Lyumjev 6-10 units before each meal  Stop Glimepiride.  Please let me know if the sugars are consistently <80 or >200.  Please return in 1.5 months with your sugar log.    PATIENT INSTRUCTIONS FOR TYPE 2 DIABETES:  DIET AND EXERCISE Diet and exercise is an important part of diabetic treatment.  We recommended aerobic exercise in the form of brisk walking (working between 40-60% of maximal aerobic capacity, similar to brisk walking) for 150 minutes per week (such as 30 minutes five days per week) along with 3 times per week performing 'resistance' training (using various gauge rubber tubes with handles) 5-10 exercises involving the major muscle groups (upper body, lower body and core) performing 10-15 repetitions (or near fatigue) each exercise. Start at half the above goal but build slowly to reach the above goals. If limited by weight, joint pain, or disability, we recommend daily walking in a swimming pool with water up to waist to reduce pressure from joints while allow for adequate exercise.    BLOOD GLUCOSES Monitoring your blood glucoses is important for continued management of your diabetes. Please check your blood glucoses 2-4 times a day: fasting, before meals and at bedtime (you can rotate these measurements - e.g. one day check before the 3 meals, the next day check before 2 of the meals and before bedtime, etc.).   HYPOGLYCEMIA (low blood sugar) Hypoglycemia is usually a reaction to not eating, exercising, or taking too much insulin/ other diabetes drugs.  Symptoms include tremors, sweating, hunger, confusion, headache, etc. Treat IMMEDIATELY with 15 grams of Carbs:  4 glucose tablets   cup regular juice/soda  2 tablespoons raisins  4 teaspoons sugar  1 tablespoon honey Recheck blood glucose in 15 mins and repeat above if still symptomatic/blood glucose  <100.  RECOMMENDATIONS TO REDUCE YOUR RISK OF DIABETIC COMPLICATIONS: * Take your prescribed MEDICATION(S) * Follow a DIABETIC diet: Complex carbs, fiber rich foods, (monounsaturated and polyunsaturated) fats * AVOID saturated/trans fats, high fat foods, >2,300 mg salt per day. * EXERCISE at least 5 times a week for 30 minutes or preferably daily.  * DO NOT SMOKE OR DRINK more than 1 drink a day. * Check your FEET every day. Do not wear tightfitting shoes. Contact us if you develop an ulcer * See your EYE doctor once a year or more if needed * Get a FLU shot once a year * Get a PNEUMONIA vaccine once before and once after age 14 years  GOALS:  * Your Hemoglobin A1c of <7%  * fasting sugars need to be <130 * after meals sugars need to be <180 (2h after you start eating) * Your Systolic BP should be 140 or lower  * Your Diastolic BP should be 80 or lower  * Your HDL (Good Cholesterol) should be 40 or higher  * Your LDL (Bad Cholesterol) should be 100 or lower. * Your Triglycerides should be 150 or lower  * Your Urine microalbumin (kidney function) should be <30 * Your Body Mass Index should be 25 or lower    Please consider the following ways to cut down carbs and fat and increase fiber and micronutrients in your diet: - substitute whole grain for white bread or pasta - substitute brown rice for white rice - substitute 90-calorie flat bread pieces for slices of bread when possible - substitute sweet potatoes  or yams for white potatoes - substitute humus for margarine - substitute tofu for cheese when possible - substitute almond or rice milk for regular milk (would not drink soy milk daily due to concern for soy estrogen influence on breast cancer risk) - substitute dark chocolate for other sweets when possible - substitute water - can add lemon or orange slices for taste - for diet sodas (artificial sweeteners will trick your body that you can eat sweets without getting calories and  will lead you to overeating and weight gain in the long run) - do not skip breakfast or other meals (this will slow down the metabolism and will result in more weight gain over time)  - can try smoothies made from fruit and almond/rice milk in am instead of regular breakfast - can also try old-fashioned (not instant) oatmeal made with almond/rice milk in am - order the dressing on the side when eating salad at a restaurant (pour less than half of the dressing on the salad) - eat as little meat as possible - can try juicing, but should not forget that juicing will get rid of the fiber, so would alternate with eating raw veg./fruits or drinking smoothies - use as little oil as possible, even when using olive oil - can dress a salad with a mix of balsamic vinegar and lemon juice, for e.g. - use agave nectar, stevia sugar, or regular sugar rather than artificial sweateners - steam or broil/roast veggies  - snack on veggies/fruit/nuts (unsalted, preferably) when possible, rather than processed foods - reduce or eliminate aspartame in diet (it is in diet sodas, chewing gum, etc) Read the labels!  Try to read Dr. Katherina Right book: "Program for Reversing Diabetes" for other ideas for healthy eating.

## 2020-11-16 NOTE — Addendum Note (Signed)
Addended by: Kenyon Ana on: 11/16/2020 05:16 PM   Modules accepted: Orders

## 2020-11-16 NOTE — Progress Notes (Addendum)
Patient ID: Shannon [name], female   DOB: May 26, 1956, 64 y.o.   MRN: [id number]   This visit occurred during the SARS-CoV-2 public health emergency.  Safety protocols were in place, including screening questions prior to the visit, additional usage of staff PPE, and extensive cleaning of exam room while observing appropriate contact time as indicated for disinfecting solutions.   HPI: Shannon [name] is a 64 y.o.-year-old female, referred by her PCP, Dr. Silvio Pate, for management of DM2, dx in 2012, insulin-dependent since ~2019, uncontrolled, with complications (peripheral neuropathy).  Patient describes that her sugars were uncontrolled for several months now.  They worsened after she had to stop Metformin due to GI symptoms and was changed to sulfonylurea. They increased even further after steroid injection for Achilles tendinitis earlier this month.  Her sugars were initially in the 300-400 range, but then increased to 500-HI (>600) >> she presented to the emergency room (11/12/2020).  Afterwards, she started to work on her diet >> she tells me that she is barely eating anything throughout the day now.  Sugars improved some, but they are still elevated.  Reviewed HbA1c: Lab Results  Component Value Date   HGBA1C 7.8 (A) 07/13/2020   HGBA1C 8.1 (H) 12/22/2019   HGBA1C 7.3 (A) 07/04/2019   HGBA1C 8.9 (H) 01/22/2019   HGBA1C 8.8 (H) 10/15/2018   HGBA1C 7.9 (H) 02/13/2018   HGBA1C 7.0 (H) 09/19/2017   HGBA1C 8.6 (H) 04/13/2017   HGBA1C 8.2 (H) 11/16/2016   HGBA1C 7.4 (H) 05/17/2016   HGBA1C 7.8 (H) 11/19/2015   HGBA1C 7.2 (H) 04/23/2015   HGBA1C 8.4 (H) 01/14/2015   HGBA1C 8.6 (H) 09/22/2014   HGBA1C 8.0 (H) 03/31/2014   HGBA1C 6.9 (H) 09/23/2013   HGBA1C 7.0 (H) 03/17/2013   HGBA1C 6.5 09/16/2012   HGBA1C 6.6 (H) 02/20/2012   HGBA1C 9.0 (H) 10/19/2011   Patient's medication doses were increased 2 weeks ago: - Glimepiride 2 >> 4 mg before breakfast - Onglyza 5 mg before breakfast -  Lantus 30-35 units at bedtime She is intolerant to Metformin >> N/V/D >> had to stop and changed to Glimepiride.  Pt checks her sugars >4x a day and they are in the 200s to 300s range mostly with significant decrease in blood sugars overnight and significantly increased postprandially, especially after breakfast and dinner, occasionally in the 400s. Lowest sugar was 70s; she has hypoglycemia awareness at 110.  Highest sugar was HI.  Glucometer: One Touch Verio  Pt's meals are: - Breakfast: usually skips, when eats b'fast: sausage bisquit - Lunch: salad or sandwich - Dinner: meat and 2 veggies - Snacks: 1-2: apples, oranges, occasionally chips  - no history of CKD, last BUN/creatinine:  Lab Results  Component Value Date   BUN 23 11/11/2020   BUN 15 04/29/2020   CREATININE 1.14 (H) 11/11/2020   CREATININE 0.87 04/29/2020  She is not on ACE inhibitor/ARB. Had cough with Lisinopril.  - + HL; last set of lipids: Lab Results  Component Value Date   CHOL 180 12/22/2019   HDL 48.70 12/22/2019   LDLCALC 126 (H) 12/14/2016   LDLDIRECT 105.0 12/22/2019   TRIG 335.0 (H) 12/22/2019   CHOLHDL 4 12/22/2019  She is on Crestor 5 mg 3x a week.  - last eye exam was on 09/01/2020: No DR. She has glaucoma - exams q6 mo.  - + numbness and tingling in her toes. + mm cramps in feet.  Pt has FH of DM in mother, son, daughter, sister, MGM,  MGF, M aunt and uncles.  She also has a history of HTN, fatty liver, GERD.  ROS: Constitutional: no weight gain, no weight loss, + fatigue, no subjective hyperthermia, no subjective hypothermia, + excessive urination and nocturia, + poor sleep Eyes: + blurry vision, no xerophthalmia ENT: no sore throat, no nodules palpated in neck, no dysphagia, no odynophagia, no hoarseness, + tinnitus, + hypoacusis Cardiovascular: no CP, no SOB, + palpitations, no leg swelling Respiratory: no cough, no SOB, no wheezing Gastrointestinal: no N, no V, no D, no C, no acid  reflux Musculoskeletal: + Muscle and joint aches Skin: no rash, + hair loss Neurological: no tremors, no numbness or tingling/no dizziness/+ HAs Psychiatric: no depression, no anxiety + Low libido  Past Medical History:  Diagnosis Date  . Allergy   . Arthritis    hip, back, shoulders, joints  . Asthma   . Complication of anesthesia    problems with block during breast augmentation and epidural during childbirth  . Depression   . Diabetes mellitus 11/12   type 2  . Family history of adverse reaction to anesthesia    PROBLEMS WITH BLOCKS  . Fatty liver   . GERD (gastroesophageal reflux disease)   . Glaucoma suspect   . Headache    migraines-4x a week headache-1x week migraine  . History of hiatal hernia   . Hyperlipidemia   . Hypertension    Controlled on meds  . Mitral valve prolapse   . 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 Osteopenia   . Pneumonia   . PONV (postoperative nausea and vomiting)   . Shingles 11/2006  . Sleep disturbance   . Tachycardia   . Vertigo    Hx of   Past Surgical History:  Procedure Laterality Date  . ABDOMINAL HYSTERECTOMY  2000   /BSO - abn. [facility]  . APPENDECTOMY  1978  . AUGMENTATION MAMMAPLASTY Bilateral 1981  . BLADDER REPAIR     x2  . BREAST ENHANCEMENT SURGERY  1987  . [place] Left 12/18/2016   Procedure: Left Heart Cath and Coronary Angiography;  Surgeon: Wellington Hampshire, MD;  Location: [facility];  Service: Cardiovascular;  Laterality: Left;  . CARPAL TUNNEL RELEASE Right   . CERVICAL DISCECTOMY  11/2009   with synthetic discs inserted--  Dr Arnoldo Morale  . COLONOSCOPY    . JOINT REPLACEMENT    . NM MYOVIEW LTD  03/2005   EF 56%, no ischemia  . RECTAL PROLAPSE REPAIR     x1  . SHOULDER ARTHROSCOPY WITH OPEN ROTATOR CUFF REPAIR Left 12/29/2019   Procedure: LEFT SHOULDER ARTHROSCOPIC DISTAL CLAVICLE EXCISISON, SUBACROMIAL DECOMPRESSION,BICEPS Nitro ;   Surgeon: Lovell Sheehan, MD;  Location: [facility];  Service: Orthopedics;  Laterality: Left;  . SHOULDER CLOSED REDUCTION Left 05/03/2020   Procedure: Left shoulder arthroscopic lysis of adhesions and manipulation under anesthesia;  Surgeon: Lovell Sheehan, MD;  Location: [facility];  Service: Orthopedics;  Laterality: Left;  . SHOULDER SURGERY  09/2008, 01/2009   Right shoulder surgery, then redone with lysis of adhesions  . TOTAL HIP ARTHROPLASTY Right 05/29/2018   Procedure: TOTAL HIP ARTHROPLASTY ANTERIOR APPROACH;  Surgeon: Lovell Sheehan, MD;  Location: [facility];  Service: Orthopedics;  Laterality: Right;  . UPPER GI ENDOSCOPY     Social History   Socioeconomic History  . Marital status: Married    Spouse name: Not on file  . Number  of children: 5  . Years of education: Not on file  . Highest education level: Not on file  Occupational History  . Occupation: homemaker  Tobacco Use  . Smoking status: Never Smoker  . Smokeless tobacco: Never Used  Vaping Use  . Vaping Use: Never used  Substance and Sexual Activity  . Alcohol use: No  . Drug use: No  . Sexual activity: Not on file  Other Topics Concern  . Not on file  Social History Narrative   Home maker   Doesn't exercise      [name] sister         Social Determinants of Health   Financial Resource Strain: Not on file  Food Insecurity: Not on file  Transportation Needs: Not on file  Physical Activity: Not on file  Stress: Not on file  Social Connections: Not on file  Intimate Partner Violence: Not on file   Current Outpatient Medications on File Prior to Visit  Medication Sig Dispense Refill  . aspirin-acetaminophen-caffeine (EXCEDRIN MIGRAINE) 250-250-65 MG tablet Take 1 tablet by mouth every 6 (six) hours as needed for headache.    . BD PEN NEEDLE MICRO U/F 32G X 6 MM MISC USE AS DIRECTED DAILY. 100 each 6  . Blood Glucose Monitoring Suppl (FIFTY50 GLUCOSE METER 2.0) w/Device KIT OneTouch Ultra2 Meter  kit    . cetirizine (ZYRTEC) 10 MG tablet Take 10 mg by mouth daily.     . clobetasol (TEMOVATE) 0.05 % external solution Apply 1 application topically as directed. 1-2 gtts qd/bid aa scalp prn flares, avoid face, groin, axilla 50 mL 0  . Continuous Blood Gluc Sensor (FREESTYLE LIBRE 14 DAY SENSOR) MISC 1 each by Does not apply route every 14 (fourteen) days. DX Code E11.59 2 each 12  . docusate sodium (COLACE) 100 MG capsule Take 300 mg by mouth at bedtime.     . fluticasone (FLONASE) 50 MCG/ACT nasal spray Place 1 spray into both nostrils daily.    Marland Kitchen glimepiride (AMARYL) 4 MG tablet Take 1 tablet (4 mg total) by mouth daily before breakfast. 10 tablet 0  . insulin glargine (LANTUS SOLOSTAR) 100 UNIT/ML Solostar Pen Inject 20 Units into the skin daily. (Patient taking differently: Inject 30 Units into the skin daily.) 30 mL 3  . LUMIGAN 0.01 % SOLN Place 1 drop into both eyes at bedtime.     . metoprolol succinate (TOPROL-XL) 25 MG 24 hr tablet Take 1.5 tablets (37.5 mg total) by mouth daily. 135 tablet 3  . MITIGARE 0.6 MG CAPS Take 1 capsule by mouth 2 (two) times daily as needed.    . mometasone (ELOCON) 0.1 % cream Apply 1 application topically as directed. Qd to bid aa hands prn flares 45 g 1  . ondansetron (ZOFRAN-ODT) 4 MG disintegrating tablet Take 1 tablet (4 mg total) by mouth every 8 (eight) hours as needed. 20 tablet 5  . ONETOUCH DELICA LANCETS FINE MISC Check blood sugar once daily and as needed. 250.00 100 each 1  . ONETOUCH ULTRA test strip USE TO CHECK BLOOD SUGAR 2  TIMES DAILY 200 strip 3  . pantoprazole (PROTONIX) 40 MG tablet TAKE 1 TABLET BY MOUTH 2  TIMES DAILY ON AN EMPTY  STOMACH (Patient taking differently: Take 40 mg by mouth 2 (two) times daily.) 180 tablet 3  . rosuvastatin (CRESTOR) 5 MG tablet TAKE 1 TABLET BY MOUTH 3  TIMES WEEKLY (Patient taking differently: Take 5 mg by mouth 3 (three) times a week.)  39 tablet 3  . saxagliptin HCl (ONGLYZA) 5 MG TABS tablet Take 1  tablet (5 mg total) by mouth daily. 90 tablet 3  . sucralfate (CARAFATE) 1 g tablet Take 1 tablet (1 g total) by mouth in the morning, at noon, and at bedtime. 270 tablet 3  . traZODone (DESYREL) 100 MG tablet TAKE 2 TABLETS BY MOUTH  EVERY NIGHT AT BEDTIME 180 tablet 3  . triamterene-hydrochlorothiazide (MAXZIDE-25) 37.5-25 MG tablet TAKE 1 TABLET BY MOUTH ONCE A DAY (Patient taking differently: Take 1 tablet by mouth daily.) 90 tablet 3  . valACYclovir (VALTREX) 1000 MG tablet TAKE 2 TABLETS BY MOUTH  AT ONSET OF SYMPTOMS AS NEEDED FOR FEVER BLISTERS AND TAKE 2 TABLETS 12 HOURS LATER. 90 tablet 3  . meloxicam (MOBIC) 7.5 MG tablet Take 1 tablet (7.5 mg total) by mouth daily. (Patient not taking: Reported on 11/16/2020) 30 tablet 0   No current facility-administered medications on file prior to visit.   Allergies  Allergen Reactions  . Lisinopril Cough  . Nitrofurantoin Other (See Comments)    Pt unsure  . Oxycodone-Acetaminophen Itching  . Atorvastatin Diarrhea    May have caused colitis--but not clear cut  . Clarithromycin Palpitations  . Glipizide Other (See Comments)    Sugars too variable  . Invokana [Canagliflozin]     Nausea, constipation   Family History  Problem Relation Age of Onset  . Diabetes Mother   . Heart disease Mother   . Hypertension Mother   . Ovarian cancer Mother   . Melanoma Mother   . Cancer Father   . Coronary artery disease Father   . Heart disease Father   . Diabetes Sister   . Depression Sister   . Coronary artery disease Sister   . Heart disease Sister   . Rheum arthritis Maternal Aunt   . Breast cancer Maternal Grandmother   . Diabetes Maternal Grandmother   . Heart attack Paternal Uncle   . Heart attack Cousin   . Heart attack Paternal Uncle   . Diabetes Maternal Grandfather   . Colon cancer Neg Hx     PE: BP 140/90   Pulse 94   Ht '5\' 4"'  (1.626 m)   Wt 148 lb 3.2 oz (67.2 kg)   SpO2 96%   BMI 25.44 kg/m  Wt Readings from Last 3  Encounters:  11/16/20 148 lb 3.2 oz (67.2 kg)  11/11/20 146 lb (66.2 kg)  11/03/20 150 lb (68 kg)   Constitutional: normal weight, in NAD Eyes: PERRLA, EOMI, no exophthalmos ENT: moist mucous membranes, no thyromegaly, no cervical lymphadenopathy Cardiovascular: RRR, No MRG Respiratory: CTA B Gastrointestinal: abdomen soft, NT, ND, BS+ Musculoskeletal: no deformities, strength intact in all 4 Skin: moist, warm, no rashes Neurological: no tremor with outstretched hands, DTR normal in all 4  ASSESSMENT: 1. DM2, insulin-dependent, uncontrolled, without long-term complications, but with significant hyperglycemia  PLAN:  1. Patient with long-standing, uncontrolled diabetes, on oral antidiabetic regimen + long-acting insulin, which became insufficient.  Her sugars increase significantly after a steroid injection in her Achilles tendon.  Initially they increase to 300s to 400s, and then to 600s. - Of note, 2 weeks ago, her Lantus dose was increased, as well as her glimepiride.  She backed off the Lantus dose afterwards since she thought that was a provisional measure.  She is currently taking between 30 and 35 units daily. - Interestingly, her latest HbA1c was not very high, and 7.8% in 06/2020 - We discussed that  we will need to test her for type 1 diabetes as soon as her sugars improve - At today's visit we reviewed her CGM tracings on her phone (we could not download it today as she forgot her username and password) and it appears that her sugars are not decreasing drastically during the night and then increase significantly after breakfast and then again after dinner.  We discussed that this is a sign of not enough mealtime coverage, so, now, due to the significant hyperglycemia/glucotoxicity, I suggested mealtime insulin.  I explained that she needs to inject this before every night meal.  We will try to send Lyumjev to her pharmacy which is injected at the start of the meal compared to  Humalog/NovoLog which I injected 15 minutes before each meal.  We will stop glimepiride now. -Since her sugars decrease abruptly overnight and also between meals, we will back off the Lantus dose to only 26 units. - I suggested to:  Patient Instructions  Please continue: - Onglyza 5 mg before breakfast  Please decrease to  - Lantus 26 units at bedtime  Please add: - Lyumjev 6-10 units before each meal  Stop Glimepiride.  Please let me know if the sugars are consistently <80 or >200.  Please return in 1.5 months with your sugar log.   - Strongly advised her to start checking sugars at different times of the day - check 3x a day, rotating checks - discussed about CBG targets for treatment: 80-130 mg/dL before meals and <180 mg/dL after meals; target HbA1c <7%. - given sugar log and advised how to fill it and to bring it at next appt  - given foot care handout and explained the principles  - given instructions for hypoglycemia management "15-15 rule"  - advised for yearly eye exams  - at next visit, I plan to refer her to nutrition. - Return to clinic in 1.5 mo with sugar log   Philemon Kingdom, MD PhD [facility]

## 2020-11-22 ENCOUNTER — Ambulatory Visit: Payer: 59 | Admitting: Internal Medicine

## 2020-11-23 ENCOUNTER — Encounter: Payer: Self-pay | Admitting: Internal Medicine

## 2020-11-24 ENCOUNTER — Other Ambulatory Visit: Payer: Self-pay | Admitting: Internal Medicine

## 2020-11-24 ENCOUNTER — Encounter: Payer: Self-pay | Admitting: Internal Medicine

## 2020-11-24 MED ORDER — FREESTYLE LIBRE 14 DAY SENSOR MISC: 1.0000 | 3 refills | Status: DC

## 2020-11-24 MED ORDER — OZEMPIC (0.25 OR 0.5 MG/DOSE) 2 MG/1.5ML ~~LOC~~ SOPN: 0.5000 mg | PEN_INJECTOR | SUBCUTANEOUS | 3 refills | Status: DC

## 2020-11-29 ENCOUNTER — Other Ambulatory Visit: Payer: Self-pay | Admitting: Internal Medicine

## 2020-11-29 DIAGNOSIS — R000 Tachycardia, unspecified: Secondary | ICD-10-CM

## 2020-12-05 ENCOUNTER — Other Ambulatory Visit: Payer: Self-pay | Admitting: Internal Medicine

## 2020-12-07 ENCOUNTER — Encounter: Payer: Self-pay | Admitting: Internal Medicine

## 2020-12-23 ENCOUNTER — Emergency Department
Admission: EM | Admit: 2020-12-23 | Discharge: 2020-12-23 | Disposition: A | Discharge status: Home / Self Care | Payer: 59 | Attending: Emergency Medicine | Admitting: Emergency Medicine

## 2020-12-23 ENCOUNTER — Other Ambulatory Visit: Payer: Self-pay

## 2020-12-23 ENCOUNTER — Encounter: Payer: Self-pay | Admitting: *Deleted

## 2020-12-23 DIAGNOSIS — Z96641 Presence of right artificial hip joint: Secondary | ICD-10-CM | POA: Insufficient documentation

## 2020-12-23 DIAGNOSIS — Z79899 Other long term (current) drug therapy: Secondary | ICD-10-CM | POA: Diagnosis not present

## 2020-12-23 DIAGNOSIS — Z7984 Long term (current) use of oral hypoglycemic drugs: Secondary | ICD-10-CM | POA: Diagnosis not present

## 2020-12-23 DIAGNOSIS — R112 Nausea with vomiting, unspecified: Secondary | ICD-10-CM

## 2020-12-23 DIAGNOSIS — I10 Essential (primary) hypertension: Secondary | ICD-10-CM | POA: Insufficient documentation

## 2020-12-23 DIAGNOSIS — Z20822 Contact with and (suspected) exposure to covid-19: Secondary | ICD-10-CM | POA: Insufficient documentation

## 2020-12-23 DIAGNOSIS — J45909 Unspecified asthma, uncomplicated: Secondary | ICD-10-CM | POA: Insufficient documentation

## 2020-12-23 DIAGNOSIS — Z7982 Long term (current) use of aspirin: Secondary | ICD-10-CM | POA: Diagnosis not present

## 2020-12-23 DIAGNOSIS — K529 Noninfective gastroenteritis and colitis, unspecified: Secondary | ICD-10-CM | POA: Diagnosis not present

## 2020-12-23 DIAGNOSIS — Z8616 Personal history of COVID-19: Secondary | ICD-10-CM | POA: Diagnosis not present

## 2020-12-23 DIAGNOSIS — Z794 Long term (current) use of insulin: Secondary | ICD-10-CM | POA: Insufficient documentation

## 2020-12-23 DIAGNOSIS — E1165 Type 2 diabetes mellitus with hyperglycemia: Secondary | ICD-10-CM | POA: Insufficient documentation

## 2020-12-23 LAB — CBC
HCT: 42.1 % (ref 36.0–46.0)
Hemoglobin: 14.1 g/dL (ref 12.0–15.0)
MCH: 27 pg (ref 26.0–34.0)
MCHC: 33.5 g/dL (ref 30.0–36.0)
MCV: 80.5 fL (ref 80.0–100.0)
Platelets: 425 10*3/uL — ABNORMAL HIGH (ref 150–400)
RBC: 5.23 MIL/uL — ABNORMAL HIGH (ref 3.87–5.11)
RDW: 13.8 % (ref 11.5–15.5)
WBC: 11.8 10*3/uL — ABNORMAL HIGH (ref 4.0–10.5)
nRBC: 0 % (ref 0.0–0.2)

## 2020-12-23 LAB — COMPREHENSIVE METABOLIC PANEL
ALT: 39 U/L (ref 0–44)
AST: 37 U/L (ref 15–41)
Albumin: 4.4 g/dL (ref 3.5–5.0)
Alkaline Phosphatase: 79 U/L (ref 38–126)
Anion gap: 14 (ref 5–15)
BUN: 19 mg/dL (ref 8–23)
CO2: 22 mmol/L (ref 22–32)
Calcium: 10.4 mg/dL — ABNORMAL HIGH (ref 8.9–10.3)
Chloride: 100 mmol/L (ref 98–111)
Creatinine, Ser: 1.22 mg/dL — ABNORMAL HIGH (ref 0.44–1.00)
GFR, Estimated: 50 mL/min — ABNORMAL LOW (ref >60)
Glucose, Bld: 282 mg/dL — ABNORMAL HIGH (ref 70–99)
Potassium: 3.6 mmol/L (ref 3.5–5.1)
Sodium: 136 mmol/L (ref 135–145)
Total Bilirubin: 0.7 mg/dL (ref 0.3–1.2)
Total Protein: 7.9 g/dL (ref 6.5–8.1)

## 2020-12-23 LAB — URINALYSIS, COMPLETE (UACMP) WITH MICROSCOPIC
Glucose, UA: NEGATIVE mg/dL
Nitrite: NEGATIVE
Protein, ur: 100 mg/dL — AB
Specific Gravity, Urine: >1.03 — ABNORMAL HIGH (ref 1.005–1.030)
pH: 6.5 (ref 5.0–8.0)

## 2020-12-23 LAB — SARS CORONAVIRUS 2 (TAT 6-24 HRS): SARS Coronavirus 2: NEGATIVE

## 2020-12-23 LAB — POC SARS CORONAVIRUS 2 AG -  ED: SARS Coronavirus 2 Ag: NEGATIVE

## 2020-12-23 LAB — LIPASE, BLOOD: Lipase: 33 U/L (ref 11–51)

## 2020-12-23 MED ORDER — LOPERAMIDE HCL 2 MG PO TABS: 2.0000 mg | ORAL_TABLET | Freq: Four times a day (QID) | ORAL | 0 refills | Status: DC | PRN

## 2020-12-23 MED ORDER — LACTATED RINGERS IV BOLUS
1000.0000 mL | Freq: Once | INTRAVENOUS | Status: AC
  Administered 2020-12-23: 1000 mL via INTRAVENOUS

## 2020-12-23 MED ORDER — PROMETHAZINE HCL 25 MG/ML IJ SOLN
12.5000 mg | Freq: Once | INTRAMUSCULAR | Status: AC
  Administered 2020-12-23: 12.5 mg via INTRAVENOUS
  Filled 2020-12-23: qty 1

## 2020-12-23 MED ORDER — PROMETHAZINE HCL 12.5 MG PO TABS: 12.5000 mg | ORAL_TABLET | Freq: Four times a day (QID) | ORAL | 0 refills | Status: DC | PRN

## 2020-12-23 NOTE — ED Notes (Signed)
Pt assisted to toilet in room at this time 

## 2020-12-23 NOTE — ED Triage Notes (Signed)
Pt has been sick and achy since last week, she got a covid test which was negative.  Pt has been having n/v and diarrhea since Sunday and is to the point where she is having dry heaves and burping (she describes a foul rotten egg smell when burping). Pt had diarrhea all night and just before she left home and again in triage.

## 2020-12-23 NOTE — ED Provider Notes (Signed)
 [facility] Emergency Department Provider Note   ____________________________________________   Event Date/Time   First MD Initiated Contact with Patient 12/23/20 1209     (approximate)  I have reviewed the triage vital signs and the nursing notes.   HISTORY  Chief Complaint Emesis    HPI Shannon [name] is a 64 y.o. female with past medical history of hypertension, hyperlipidemia, diabetes, and GERD who presents to the ED complaining of nausea and vomiting.  Patient reports that she initially started feeling bad about 1 week ago with weakness and malaise.  She started to feel better, but 4 days ago developed nausea with frequent vomiting and diarrhea.  She has been unable to tolerate liquids or solids and complains of diffuse crampy abdominal pain.  She has not noticed any blood in her stool or vomit.  She denies any cough, chest pain, or shortness of breath.  She has felt feverish but has not taken her temperature.  She denies any recent courses of antibiotics or history of C. difficile.        Past Medical History:  Diagnosis Date  . Allergy   . Arthritis    hip, back, shoulders, joints  . Asthma   . Complication of anesthesia    problems with block during breast augmentation and epidural during childbirth  . Depression   . Diabetes mellitus 11/12   type 2  . Family history of adverse reaction to anesthesia    PROBLEMS WITH BLOCKS  . Fatty liver   . GERD (gastroesophageal reflux disease)   . Glaucoma suspect   . Headache    migraines-4x a week headache-1x week migraine  . History of hiatal hernia   . Hyperlipidemia   . Hypertension    Controlled on meds  . Mitral valve prolapse   . 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 Osteopenia   . Pneumonia   . PONV (postoperative nausea and vomiting)   . Shingles 11/2006  . Sleep disturbance   . Tachycardia   . Vertigo    Hx of    Patient Active Problem List   Diagnosis Date Noted  .  Abdominal pain 11/03/2020  . 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 COVID-19 virus infection 03/03/2020  . History of arthroscopic procedure on shoulder 01/09/2020  . UTI (urinary tract infection) 09/09/2019  . Gouty arthritis of right great toe 07/04/2019  . Contracture of finger joint, left 07/04/2019  . Status post total hip replacement, right 05/29/2018  . Headache 05/20/2018  . Low back pain 06/18/2017  . Varicella zoster 07/10/2016  . 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 Type 2 diabetes mellitus with hyperglycemia (HCC) 11/04/2014  . Colitis 03/17/2014  . Noninfective gastroenteritis and colitis 03/17/2014  . Routine general medical examination at a health care facility 09/16/2012  . Mixed hyperlipidemia   . Type 2 diabetes mellitus with neurological manifestations, controlled (HCC)   . FIBROCYSTIC BREAST DISEASE 06/12/2007  . GLAUCOMA 05/30/2007  . Essential hypertension, benign 05/30/2007  . MITRAL VALVE PROLAPSE 05/30/2007  . ASTHMA 05/30/2007  . OSTEOPENIA 05/30/2007  . Glaucoma 05/30/2007  . Mitral valve disorder 05/30/2007  . ALLERGIC RHINITIS 03/20/2007  . GERD 03/20/2007  . Sleep disturbance 03/20/2007    Past Surgical History:  Procedure Laterality Date  . ABDOMINAL HYSTERECTOMY  2000   /BSO - abn. [facility]  . APPENDECTOMY  1978  . AUGMENTATION MAMMAPLASTY Bilateral 1981  . BLADDER REPAIR     x2  . BREAST   ENHANCEMENT SURGERY  1987  . CARDIAC CATHETERIZATION     [facility]  . CARDIAC CATHETERIZATION Left 12/18/2016   Procedure: Left Heart Cath and Coronary Angiography;  Surgeon: Muhammad A Arida, MD;  Location: [facility] INVASIVE CV LAB;  Service: Cardiovascular;  Laterality: Left;  . CARPAL TUNNEL RELEASE Right   . CERVICAL DISCECTOMY  11/2009   with synthetic discs inserted--  Dr Jenkins  . COLONOSCOPY    . JOINT REPLACEMENT    . NM MYOVIEW LTD  03/2005   EF 56%, no ischemia  . RECTAL PROLAPSE REPAIR     x1  . SHOULDER ARTHROSCOPY WITH OPEN ROTATOR CUFF  REPAIR Left 12/29/2019   Procedure: LEFT SHOULDER ARTHROSCOPIC DISTAL CLAVICLE EXCISISON, SUBACROMIAL DECOMPRESSION,BICEPS TENOTOMY,ROTATOR CUFF REPAIR ;  Surgeon: Bowers, James R, MD;  Location: [facility] ORS;  Service: Orthopedics;  Laterality: Left;  . SHOULDER CLOSED REDUCTION Left 05/03/2020   Procedure: Left shoulder arthroscopic lysis of adhesions and manipulation under anesthesia;  Surgeon: Bowers, James R, MD;  Location: [facility] ORS;  Service: Orthopedics;  Laterality: Left;  . SHOULDER SURGERY  09/2008, 01/2009   Right shoulder surgery, then redone with lysis of adhesions  . TOTAL HIP ARTHROPLASTY Right 05/29/2018   Procedure: TOTAL HIP ARTHROPLASTY ANTERIOR APPROACH;  Surgeon: Bowers, James R, MD;  Location: [facility] ORS;  Service: Orthopedics;  Laterality: Right;  . UPPER GI ENDOSCOPY      Prior to Admission medications   Medication Sig Start Date End Date Taking? Authorizing Provider  loperamide (IMODIUM A-D) 2 MG tablet Take 1 tablet (2 mg total) by mouth 4 (four) times daily as needed for diarrhea or loose stools. 12/23/20  Yes Jessup, Charles, MD  promethazine (PHENERGAN) 12.5 MG tablet Take 1 tablet (12.5 mg total) by mouth every 6 (six) hours as needed for nausea or vomiting. 12/23/20  Yes Jessup, Charles, MD  aspirin-acetaminophen-caffeine (EXCEDRIN MIGRAINE) 250-250-65 MG tablet Take 1 tablet by mouth every 6 (six) hours as needed for headache.    [provider]  Blood Glucose Monitoring Suppl (FIFTY50 GLUCOSE METER 2.0) w/Device KIT OneTouch Ultra2 Meter kit    [provider]  cetirizine (ZYRTEC) 10 MG tablet Take 10 mg by mouth daily.     [provider]  clobetasol (TEMOVATE) 0.05 % external solution Apply 1 application topically as directed. 1-2 gtts qd/bid aa scalp prn flares, avoid face, groin, axilla 11/08/20   Stewart, Tara, MD  Continuous Blood Gluc Sensor (FREESTYLE LIBRE 14 DAY SENSOR) MISC 1 each by Does not apply route every 14 (fourteen) days. DX Code  E11.59 11/24/20   Gherghe, Cristina, MD  docusate sodium (COLACE) 100 MG capsule Take 300 mg by mouth at bedtime.     [provider]  fluticasone (FLONASE) 50 MCG/ACT nasal spray Place 1 spray into both nostrils daily.    [provider]  insulin glargine (LANTUS SOLOSTAR) 100 UNIT/ML Solostar Pen Inject 26 Units into the skin at bedtime. 11/16/20   Gherghe, Cristina, MD  Insulin Lispro-aabc, 1 U Dial, (LYUMJEV KWIKPEN) 100 UNIT/ML SOPN Inject 8-10 Units into the skin 3 (three) times daily. 11/16/20   Gherghe, Cristina, MD  Insulin Pen Needle 32G X 4 MM MISC Use 4x a day 11/16/20   Gherghe, Cristina, MD  LUMIGAN 0.01 % SOLN Place 1 drop into both eyes at bedtime.  10/13/15   [provider]  meloxicam (MOBIC) 7.5 MG tablet Take 1 tablet (7.5 mg total) by mouth daily. Patient not taking: Reported on 11/16/2020 11/08/20   Hyatt,   Max T, DPM  metoprolol succinate (TOPROL-XL) 25 MG 24 hr tablet TAKE 1 AND 1/2 TABLETS BY  MOUTH DAILY 11/30/20   Letvak, Richard I, MD  MITIGARE 0.6 MG CAPS Take 1 capsule by mouth 2 (two) times daily as needed. 05/18/20   [provider]  mometasone (ELOCON) 0.1 % cream Apply 1 application topically as directed. Qd to bid aa hands prn flares 11/08/20   Stewart, Tara, MD  ondansetron (ZOFRAN-ODT) 4 MG disintegrating tablet Take 1 tablet (4 mg total) by mouth every 8 (eight) hours as needed. 11/15/20   Letvak, Richard I, MD  ONETOUCH DELICA LANCETS FINE MISC Check blood sugar once daily and as needed. 250.00 05/26/14   Letvak, Richard I, MD  ONETOUCH ULTRA test strip USE TO CHECK BLOOD SUGAR 2  TIMES DAILY 12/06/20   Letvak, Richard I, MD  pantoprazole (PROTONIX) 40 MG tablet TAKE 1 TABLET BY MOUTH 2  TIMES DAILY ON AN EMPTY  STOMACH Patient taking differently: Take 40 mg by mouth 2 (two) times daily. 03/10/20   Letvak, Richard I, MD  rosuvastatin (CRESTOR) 5 MG tablet TAKE 1 TABLET BY MOUTH 3  TIMES WEEKLY 12/06/20   Letvak, Richard I, MD   saxagliptin HCl (ONGLYZA) 5 MG TABS tablet Take 1 tablet (5 mg total) by mouth daily. 10/11/20   Letvak, Richard I, MD  Semaglutide,0.25 or 0.5MG/DOS, (OZEMPIC, 0.25 OR 0.5 MG/DOSE,) 2 MG/1.5ML SOPN Inject 0.5 mg into the skin once a week. 11/24/20   Gherghe, Cristina, MD  sucralfate (CARAFATE) 1 g tablet Take 1 tablet (1 g total) by mouth in the morning, at noon, and at bedtime. 10/11/20   Letvak, Richard I, MD  traZODone (DESYREL) 100 MG tablet TAKE 2 TABLETS BY MOUTH  EVERY NIGHT AT BEDTIME 06/29/20   Letvak, Richard I, MD  triamterene-hydrochlorothiazide (MAXZIDE-25) 37.5-25 MG tablet TAKE 1 TABLET BY MOUTH ONCE DAILY 12/06/20   Letvak, Richard I, MD  valACYclovir (VALTREX) 1000 MG tablet TAKE 2 TABLETS BY MOUTH  AT ONSET OF SYMPTOMS AS NEEDED FOR FEVER BLISTERS AND TAKE 2 TABLETS 12 HOURS LATER. 07/07/20   Letvak, Richard I, MD    Allergies Lisinopril, Nitrofurantoin, Oxycodone-acetaminophen, Atorvastatin, Clarithromycin, Glipizide, and Invokana [canagliflozin]  Family History  Problem Relation Age of Onset  . Diabetes Mother   . Heart disease Mother   . Hypertension Mother   . Ovarian cancer Mother   . Melanoma Mother   . Cancer Father   . Coronary artery disease Father   . Heart disease Father   . Diabetes Sister   . Depression Sister   . Coronary artery disease Sister   . Heart disease Sister   . Rheum arthritis Maternal Aunt   . Breast cancer Maternal Grandmother   . Diabetes Maternal Grandmother   . Heart attack Paternal Uncle   . Heart attack Cousin   . Heart attack Paternal Uncle   . Diabetes Maternal Grandfather   . Colon cancer Neg Hx     Social History Social History   Tobacco Use  . Smoking status: Never Smoker  . Smokeless tobacco: Never Used  Vaping Use  . Vaping Use: Never used  Substance Use Topics  . Alcohol use: No  . Drug use: No    Review of Systems  Constitutional: Positive for subjective fever/chills Eyes: No visual changes. ENT: No sore  throat. Cardiovascular: Denies chest pain. Respiratory: Denies shortness of breath. Gastrointestinal: Positive for abdominal pain, nausea, vomiting, and diarrhea.  No constipation. Genitourinary: Negative for   dysuria. Musculoskeletal: Negative for back pain. Skin: Negative for rash. Neurological: Negative for headaches, focal weakness or numbness.  ____________________________________________   PHYSICAL EXAM:  VITAL SIGNS: ED Triage Vitals  Enc Vitals Group     BP 12/23/20 1203 140/81     Pulse Rate 12/23/20 1203 (!) 125     Resp 12/23/20 1203 (!) 22     Temp 12/23/20 1203 (!) 97.4 F (36.3 C)     Temp Source 12/23/20 1203 Oral     SpO2 12/23/20 1203 100 %     Weight 12/23/20 1204 143 lb (64.9 kg)     Height --      Head Circumference --      Peak Flow --      Pain Score 12/23/20 1204 2     Pain Loc --      Pain Edu? --      Excl. in [place]? --     Constitutional: Alert and oriented. Eyes: Conjunctivae are normal. Head: Atraumatic. Nose: No congestion/rhinnorhea. Mouth/Throat: Mucous membranes are dry. Neck: Normal ROM Cardiovascular: Tachycardic, regular rhythm. Grossly normal heart sounds. Respiratory: Normal respiratory effort.  No retractions. Lungs CTAB. Gastrointestinal: Soft and nontender. No distention. Genitourinary: deferred Musculoskeletal: No lower extremity tenderness nor edema. Neurologic:  Normal speech and language. No gross focal neurologic deficits are appreciated. Skin:  Skin is warm, dry and intact. No rash noted. Psychiatric: Mood and affect are normal. Speech and behavior are normal.  ____________________________________________   LABS (all labs ordered are listed, but only abnormal results are displayed)  Labs Reviewed  COMPREHENSIVE METABOLIC PANEL - Abnormal; Notable for the following components:      Result Value   Glucose, Bld 282 (*)    Creatinine, Ser 1.22 (*)    Calcium 10.4 (*)    GFR, Estimated 50 (*)    All other components  within normal limits  CBC - Abnormal; Notable for the following components:   WBC 11.8 (*)    RBC 5.23 (*)    Platelets 425 (*)    All other components within normal limits  URINALYSIS, COMPLETE (UACMP) WITH MICROSCOPIC - Abnormal; Notable for the following components:   Specific Gravity, Urine >1.030 (*)    Hgb urine dipstick TRACE (*)    Bilirubin Urine SMALL (*)    Ketones, ur TRACE (*)    Protein, ur 100 (*)    Leukocytes,Ua TRACE (*)    Bacteria, UA RARE (*)    All other components within normal limits  SARS CORONAVIRUS 2 (TAT 6-24 HRS)  LIPASE, BLOOD  POC SARS CORONAVIRUS 2 AG -  ED   ____________________________________________  EKG  ED ECG REPORT I, Blake Divine, the attending physician, personally viewed and interpreted this ECG.   Date: 12/23/2020  EKG Time: 12:25  Rate: 113  Rhythm: sinus tachycardia  Axis: Normal  Intervals:none  ST&T Change: None   PROCEDURES  Procedure(s) performed (including Critical Care):  Procedures   ____________________________________________   INITIAL IMPRESSION / ASSESSMENT AND PLAN / ED COURSE       65 year old female with past medical history of hypertension, hyperlipidemia, diabetes, and GERD who presents to the ED for 4 days of nausea, vomiting, diarrhea, and crampy abdominal pain.  She reports objective fevers but denies any respiratory symptoms and is maintaining O2 sats on room air.  No focal abdominal tenderness noted on exam.  She does appear dehydrated and tachycardic, we will treat with Phenergan as she has not had a good response to Zofran  at home, also hydrate with IV fluids.  We will screen labs including LFTs and lipase, but I do not feel imaging of her abdomen is indicated at this time given lack of tenderness.  Symptoms sound most consistent with a gastroenteritis, potentially secondary to COVID-19.  Labs are remarkable for mild hyperglycemia but otherwise reassuring.  Patient feeling better following  Phenergan and IV fluids, she was able to tolerate some water and crackers without difficulty.  She is appropriate for discharge home with PCP follow-up, Covid results are pending at this time.  No diarrhea here in the ED or recent antibiotics to suggest C. difficile colitis.  She was counseled to return to the ED for new worsening symptoms, patient agrees with plan.      ____________________________________________   FINAL CLINICAL IMPRESSION(S) / ED DIAGNOSES  Final diagnoses:  Gastroenteritis  Non-intractable vomiting with nausea, unspecified vomiting type     ED Discharge Orders         Ordered    promethazine (PHENERGAN) 12.5 MG tablet  Every 6 hours PRN        12/23/20 1513    loperamide (IMODIUM A-D) 2 MG tablet  4 times daily PRN        12/23/20 1513           Note:  This document was prepared using Dragon voice recognition software and may include unintentional dictation errors.   Jessup, Charles, MD 12/23/20 1514  

## 2020-12-31 ENCOUNTER — Encounter: Payer: Self-pay | Admitting: Internal Medicine

## 2020-12-31 ENCOUNTER — Ambulatory Visit: Payer: 59 | Admitting: Internal Medicine

## 2020-12-31 ENCOUNTER — Other Ambulatory Visit: Payer: Self-pay

## 2020-12-31 VITALS — BP 130/80 | HR 108 | Ht 64.0 in | Wt 153.4 lb

## 2020-12-31 DIAGNOSIS — E782 Mixed hyperlipidemia: Secondary | ICD-10-CM | POA: Diagnosis not present

## 2020-12-31 DIAGNOSIS — E1165 Type 2 diabetes mellitus with hyperglycemia: Secondary | ICD-10-CM

## 2020-12-31 DIAGNOSIS — E1142 Type 2 diabetes mellitus with diabetic polyneuropathy: Secondary | ICD-10-CM | POA: Diagnosis not present

## 2020-12-31 MED ORDER — TOUJEO MAX SOLOSTAR 300 UNIT/ML ~~LOC~~ SOPN: PEN_INJECTOR | SUBCUTANEOUS | 3 refills | Status: DC

## 2020-12-31 NOTE — Progress Notes (Signed)
Patient ID: Shannon [name], female   DOB: [birth year], 65 y.o.   MRN: [id number]   This visit occurred during the SARS-CoV-2 public health emergency.  Safety protocols were in place, including screening questions prior to the visit, additional usage of staff PPE, and extensive cleaning of exam room while observing appropriate contact time as indicated for disinfecting solutions.   HPI: Shannon [name] is a 65 y.o.-year-old female, initially referred by her PCP, Dr. Silvio Pate, returning for follow-up for DM2, dx in 2012, insulin-dependent since ~2019, uncontrolled, with complications (peripheral neuropathy). Last visit 1.5 months ago.  She describes nausea in the last 2 weeks and was recently in the ED on 12/24/2019.  Per review of the labs, she appeared dehydrated, but no distinct pathology was found.  Of note, lipase was normal.  Reviewed history: Patient describes that her sugars were uncontrolled for several months before last visit.  They worsened after she had to stop Metformin due to GI symptoms and was changed to sulfonylurea. They increased even further after steroid injection for Achilles tendinitis earlier this month.  Her sugars were initially in the 300-400 range, but then increased to 500-HI (>600) >> she presented to the emergency room (11/12/2020).  Afterwards, she started to work on her diet >> she tells me that she was barely eating anything throughout the day now.  Sugars improved some, but they were still elevated.  Reviewed HbA1c levels: Lab Results  Component Value Date   HGBA1C 9.0 (A) 11/16/2020   HGBA1C 7.8 (A) 07/13/2020   HGBA1C 8.1 (H) 12/22/2019   HGBA1C 7.3 (A) 07/04/2019   HGBA1C 8.9 (H) 01/22/2019   HGBA1C 8.8 (H) 10/15/2018   HGBA1C 7.9 (H) 02/13/2018   HGBA1C 7.0 (H) 09/19/2017   HGBA1C 8.6 (H) 04/13/2017   HGBA1C 8.2 (H) 11/16/2016   HGBA1C 7.4 (H) 05/17/2016   HGBA1C 7.8 (H) 11/19/2015   HGBA1C 7.2 (H) 04/23/2015   HGBA1C 8.4 (H) 01/14/2015   HGBA1C 8.6 (H)  09/22/2014   HGBA1C 8.0 (H) 03/31/2014   HGBA1C 6.9 (H) 09/23/2013   HGBA1C 7.0 (H) 03/17/2013   HGBA1C 6.5 09/16/2012   HGBA1C 6.6 (H) 02/20/2012   HGBA1C 9.0 (H) 10/19/2011   At last visit she was on: - Glimepiride 2 >> 4 mg before breakfast   - Onglyza 5 mg before breakfast - Lantus 30-35 units at bedtime She is intolerant to Metformin >> N/V/D >> had to stop and changed to Glimepiride.  We changed to: - Ozempic 0.5 mg weekly - Lantus 26 >> 34 units at bedtime - Lyumjev 6-10 >> 12-18 >> 15 units before each meal  *Insulin doses increased 12/07/2020.  At last visit, she was checking sugars more than 4 times a day: Mostly in the 200s to 300s and occasionally even 400s after dinner. She has a freestyle libre CGM.  Freestyle libre CGM parameters: - Average: 271 - % active CGM time: 93% of the time - Glucose variability 22% (target < or = to 36%) - time in range:  - very low (<54): 0% - low (54-69): 0% - normal range (70-180): 17% - high sugars (181-250): 49% - very high sugars (>250): 34%    Lowest sugar was 70s >> 120; she has hypoglycemia awareness at 110.  Highest sugar was HI >> 300s  Glucometer: One Touch Verio  Pt's meals are: - Breakfast: usually skips, when eats b'fast: sausage bisquit - Lunch: salad or sandwich - Dinner: meat and 2 veggies - Snacks: 1-2: apples, oranges, occasionally  chips  -+ CKD, last BUN/creatinine:  Lab Results  Component Value Date   BUN 19 12/23/2020   BUN 23 11/11/2020   CREATININE 1.22 (H) 12/23/2020   CREATININE 1.14 (H) 11/11/2020  She is not on ACE inhibitor/ARB. She had cough with lisinopril..  -+ HL; last set of lipids: Lab Results  Component Value Date   CHOL 180 12/22/2019   HDL 48.70 12/22/2019   LDLCALC 126 (H) 12/14/2016   LDLDIRECT 105.0 12/22/2019   TRIG 335.0 (H) 12/22/2019   CHOLHDL 4 12/22/2019  On Crestor 5 mg 3 times a day  - last eye exam was in 08/2020: No DR. She has glaucoma - exams q6 mo.  -She  has numbness and tingling in her toes. She also has muscle cramps in her feet.  Pt has FH of DM in mother, son, daughter, sister, MGM, MGF, M aunt and uncles.  She also has a history of HTN, fatty liver, GERD.  ROS: Constitutional: no weight gain/no weight loss, + fatigue, no subjective hyperthermia, no subjective hypothermia, + increased urination, + poor sleep Eyes: + blurry vision, no xerophthalmia ENT: no sore throat, no nodules palpated in neck, no dysphagia, no odynophagia, no hoarseness Cardiovascular: no CP/no SOB/+ palpitations/no leg swelling Respiratory: no cough/no SOB/no wheezing Gastrointestinal: + N/no V/no D/no C/no acid reflux Musculoskeletal: + muscle aches/no joint aches Skin: no rashes, + hair loss Neurological: no tremors/no numbness/no tingling/no dizziness  I reviewed pt's medications, allergies, PMH, social hx, family hx, and changes were documented in the history of present illness. Otherwise, unchanged from my initial visit note.  Past Medical History:  Diagnosis Date  . Allergy   . Arthritis    hip, back, shoulders, joints  . Asthma   . Complication of anesthesia    problems with block during breast augmentation and epidural during childbirth  . Depression   . Diabetes mellitus 11/12   type 2  . Family history of adverse reaction to anesthesia    PROBLEMS WITH BLOCKS  . Fatty liver   . GERD (gastroesophageal reflux disease)   . Glaucoma suspect   . Headache    migraines-4x a week headache-1x week migraine  . History of hiatal hernia   . Hyperlipidemia   . Hypertension    Controlled on meds  . Mitral valve prolapse   . 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 Osteopenia   . Pneumonia   . PONV (postoperative nausea and vomiting)   . Shingles 11/2006  . Sleep disturbance   . Tachycardia   . Vertigo    Hx of   Past Surgical History:  Procedure Laterality Date  . ABDOMINAL HYSTERECTOMY  2000   /BSO - abn. Paps  . APPENDECTOMY  1978  .  AUGMENTATION MAMMAPLASTY Bilateral 1981  . BLADDER REPAIR     x2  . BREAST ENHANCEMENT SURGERY  1987  . Wann Left 12/18/2016   Procedure: Left Heart Cath and Coronary Angiography;  Surgeon: Wellington Hampshire, MD;  Location: [facility];  Service: Cardiovascular;  Laterality: Left;  . CARPAL TUNNEL RELEASE Right   . CERVICAL DISCECTOMY  11/2009   with synthetic discs inserted--  Dr Arnoldo Morale  . COLONOSCOPY    . JOINT REPLACEMENT    . NM MYOVIEW LTD  03/2005   EF 56%, no ischemia  . RECTAL PROLAPSE REPAIR     x1  . SHOULDER ARTHROSCOPY WITH OPEN ROTATOR CUFF REPAIR Left  12/29/2019   Procedure: LEFT SHOULDER ARTHROSCOPIC DISTAL CLAVICLE EXCISISON, SUBACROMIAL DECOMPRESSION,BICEPS [place] ;  Surgeon: Lovell Sheehan, MD;  Location: [facility];  Service: Orthopedics;  Laterality: Left;  . SHOULDER CLOSED REDUCTION Left 05/03/2020   Procedure: Left shoulder arthroscopic lysis of adhesions and manipulation under anesthesia;  Surgeon: Lovell Sheehan, MD;  Location: [facility];  Service: Orthopedics;  Laterality: Left;  . SHOULDER SURGERY  09/2008, 01/2009   Right shoulder surgery, then redone with lysis of adhesions  . TOTAL HIP ARTHROPLASTY Right 05/29/2018   Procedure: TOTAL HIP ARTHROPLASTY ANTERIOR APPROACH;  Surgeon: Lovell Sheehan, MD;  Location: [facility];  Service: Orthopedics;  Laterality: Right;  . UPPER GI ENDOSCOPY     Social History   Socioeconomic History  . Marital status: Married    Spouse name: Not on file  . Number of children: 5  . Years of education: Not on file  . Highest education level: Not on file  Occupational History  . Occupation: homemaker  Tobacco Use  . Smoking status: Never Smoker  . Smokeless tobacco: Never Used  Vaping Use  . Vaping Use: Never used  Substance and Sexual Activity  . Alcohol use: No  . Drug use: No  . Sexual activity: Not on file  Other Topics Concern  . Not  on file  Social History Narrative   Home maker   Doesn't exercise      [name] sister         Social Determinants of Health   Financial Resource Strain: Not on file  Food Insecurity: Not on file  Transportation Needs: Not on file  Physical Activity: Not on file  Stress: Not on file  Social Connections: Not on file  Intimate Partner Violence: Not on file   Current Outpatient Medications on File Prior to Visit  Medication Sig Dispense Refill  . aspirin-acetaminophen-caffeine (EXCEDRIN MIGRAINE) 250-250-65 MG tablet Take 1 tablet by mouth every 6 (six) hours as needed for headache.    . Blood Glucose Monitoring Suppl (FIFTY50 GLUCOSE METER 2.0) w/Device KIT OneTouch Ultra2 Meter kit    . cetirizine (ZYRTEC) 10 MG tablet Take 10 mg by mouth daily.     . clobetasol (TEMOVATE) 0.05 % external solution Apply 1 application topically as directed. 1-2 gtts qd/bid aa scalp prn flares, avoid face, groin, axilla 50 mL 0  . Continuous Blood Gluc Sensor (FREESTYLE LIBRE 14 DAY SENSOR) MISC 1 each by Does not apply route every 14 (fourteen) days. DX Code E11.59 6 each 3  . docusate sodium (COLACE) 100 MG capsule Take 300 mg by mouth at bedtime.     . fluticasone (FLONASE) 50 MCG/ACT nasal spray Place 1 spray into both nostrils daily.    . insulin glargine (LANTUS SOLOSTAR) 100 UNIT/ML Solostar Pen Inject 26 Units into the skin at bedtime. 30 mL 3  . Insulin Lispro-aabc, 1 U Dial, (LYUMJEV KWIKPEN) 100 UNIT/ML SOPN Inject 8-10 Units into the skin 3 (three) times daily. 15 mL 3  . Insulin Pen Needle 32G X 4 MM MISC Use 4x a day 200 each 3  . loperamide (IMODIUM A-D) 2 MG tablet Take 1 tablet (2 mg total) by mouth 4 (four) times daily as needed for diarrhea or loose stools. 30 tablet 0  . LUMIGAN 0.01 % SOLN Place 1 drop into both eyes at bedtime.     . meloxicam (MOBIC) 7.5 MG tablet Take 1 tablet (7.5 mg total) by mouth daily. (Patient not taking: Reported  on 11/16/2020) 30 tablet 0  .  metoprolol succinate (TOPROL-XL) 25 MG 24 hr tablet TAKE 1 AND 1/2 TABLETS BY  MOUTH DAILY 135 tablet 3  . MITIGARE 0.6 MG CAPS Take 1 capsule by mouth 2 (two) times daily as needed.    . mometasone (ELOCON) 0.1 % cream Apply 1 application topically as directed. Qd to bid aa hands prn flares 45 g 1  . ondansetron (ZOFRAN-ODT) 4 MG disintegrating tablet Take 1 tablet (4 mg total) by mouth every 8 (eight) hours as needed. 20 tablet 5  . ONETOUCH DELICA LANCETS FINE MISC Check blood sugar once daily and as needed. 250.00 100 each 1  . ONETOUCH ULTRA test strip USE TO CHECK BLOOD SUGAR 2  TIMES DAILY 200 strip 3  . pantoprazole (PROTONIX) 40 MG tablet TAKE 1 TABLET BY MOUTH 2  TIMES DAILY ON AN EMPTY  STOMACH (Patient taking differently: Take 40 mg by mouth 2 (two) times daily.) 180 tablet 3  . promethazine (PHENERGAN) 12.5 MG tablet Take 1 tablet (12.5 mg total) by mouth every 6 (six) hours as needed for nausea or vomiting. 15 tablet 0  . rosuvastatin (CRESTOR) 5 MG tablet TAKE 1 TABLET BY MOUTH 3  TIMES WEEKLY 39 tablet 3  . saxagliptin HCl (ONGLYZA) 5 MG TABS tablet Take 1 tablet (5 mg total) by mouth daily. 90 tablet 3  . Semaglutide,0.25 or 0.5MG/DOS, (OZEMPIC, 0.25 OR 0.5 MG/DOSE,) 2 MG/1.5ML SOPN Inject 0.5 mg into the skin once a week. 4.5 mL 3  . sucralfate (CARAFATE) 1 g tablet Take 1 tablet (1 g total) by mouth in the morning, at noon, and at bedtime. 270 tablet 3  . traZODone (DESYREL) 100 MG tablet TAKE 2 TABLETS BY MOUTH  EVERY NIGHT AT BEDTIME 180 tablet 3  . triamterene-hydrochlorothiazide (MAXZIDE-25) 37.5-25 MG tablet TAKE 1 TABLET BY MOUTH ONCE DAILY 90 tablet 3  . valACYclovir (VALTREX) 1000 MG tablet TAKE 2 TABLETS BY MOUTH  AT ONSET OF SYMPTOMS AS NEEDED FOR FEVER BLISTERS AND TAKE 2 TABLETS 12 HOURS LATER. 90 tablet 3   No current facility-administered medications on file prior to visit.   Allergies  Allergen Reactions  . Lisinopril Cough  . Nitrofurantoin Other (See Comments)     Pt unsure  . Oxycodone-Acetaminophen Itching  . Atorvastatin Diarrhea    May have caused colitis--but not clear cut  . Clarithromycin Palpitations  . Glipizide Other (See Comments)    Sugars too variable  . Invokana [Canagliflozin]     Nausea, constipation   Family History  Problem Relation Age of Onset  . Diabetes Mother   . Heart disease Mother   . Hypertension Mother   . Ovarian cancer Mother   . Melanoma Mother   . Cancer Father   . Coronary artery disease Father   . Heart disease Father   . Diabetes Sister   . Depression Sister   . Coronary artery disease Sister   . Heart disease Sister   . Rheum arthritis Maternal Aunt   . Breast cancer Maternal Grandmother   . Diabetes Maternal Grandmother   . Heart attack Paternal Uncle   . Heart attack Cousin   . Heart attack Paternal Uncle   . Diabetes Maternal Grandfather   . Colon cancer Neg Hx     PE: BP 130/80   Pulse (!) 108   Ht '5\' 4"'  (1.626 m)   Wt 153 lb 6.4 oz (69.6 kg)   SpO2 96%   BMI 26.33 kg/m  Wt Readings from Last 3 Encounters:  12/31/20 153 lb 6.4 oz (69.6 kg)  12/23/20 143 lb (64.9 kg)  11/16/20 148 lb 3.2 oz (67.2 kg)   Constitutional: normal weight, in NAD Eyes: PERRLA, EOMI, no exophthalmos ENT: moist mucous membranes, no thyromegaly, no cervical lymphadenopathy Cardiovascular: Tachycardia, RR, No MRG Respiratory: CTA B Gastrointestinal: abdomen soft, NT, ND, BS+ Musculoskeletal: no deformities, strength intact in all 4 Skin: moist, warm, no rashes Neurological: no tremor with outstretched hands, DTR normal in all 4  ASSESSMENT: 1. DM2, insulin-dependent, uncontrolled, without long-term complications, but with significant hyperglycemia  2. HL  PLAN:  1. Patient with longstanding, uncontrolled, type 2 diabetes, on injectable antidiabetic regimen with weekly GLP-1 receptor agonist and basal-bolus insulin regimen. Her sugars increased significantly after a steroid injection in her Achilles  tendon before our last visit with sugars were very high at last visit, in the 200s and even up to 400s, but previously 600s right after the injections. At that time, we discussed about the need to test her for type 1 diabetes, so we could not do so at last visit due to the high blood sugars. We increased her Lantus and switched her from NovoLog to Lyumjev and also switched Onglyza to [place]. We stopped her glimepiride. She contacted me after last visit with still high blood sugars so we increased the doses further. At last visit HbA1c the was 9.0%, increased from 7.8%. CGM interpretation: -At today's visit, we reviewed her CGM downloads: It appears that 17% of values are in target range (goal >70%), while 83% are higher than 180 (goal <25%), and 0% are lower than 70 (goal <4%).  The calculated average blood sugar is 231.  The projected HbA1c for the next 3 months (GMI) is 8.8%. -Reviewing the CGM trends, it appears that sugars remain above normal range at all time of the day, with slight fluctuations.  The most prominent pattern is a slight decrease in blood sugars after an initial hyperglycemic response to lunch and increase after dinner.  Sugars are stable overnight, all above target.  -Upon questioning, she was nauseated in the last few weeks, and it is possible that this was due to [place].  Of note, like, so there was no sign of pancreatitis however, due to the significant nausea, I advised him to stop Ozempic.  We discussed that it is possible that she has functional gastroparesis due to her elevated blood sugars and she may be able to tolerate Ozempic in the future.  For now, it would be probably best to stop it and  increase her insulin instead.  Her Lyumjev dose appears to be adequate, however, we will go ahead and increase her long-acting insulin.  I would like her to get this in a more concentrated form so I am sending Toujeo to her pharmacy.  We will also increase the dose to 40 units and then 44 if  needed and I advised her to continue to increase the dose if sugars do not improve.  I also advised her that if Toujeo is not covered, to increase the dose in a similar fashion for Lantus. - I suggested to:  Patient Instructions  Please stop: - Ozempic   Please increase - Lantus/Toujeo 40 units at bedtime (increase by 4units every 4 days unstil sugars in am are <140)  Change: - Lyumjev 12-18 units before each meal  Please return in 1.5 months.   - advised to check sugars at different times of the day - 4x  a day, rotating check times - advised for yearly eye exams >> she is UTD - return to clinic in 1.5 months  2. HL - Reviewed latest lipid panel from a year ago: LDL above target, triglycerides high: Lab Results  Component Value Date   CHOL 180 12/22/2019   HDL 48.70 12/22/2019   LDLCALC 126 (H) 12/14/2016   LDLDIRECT 105.0 12/22/2019   TRIG 335.0 (H) 12/22/2019   CHOLHDL 4 12/22/2019  - Continues Crestor 5 times a week without side effects.  Philemon Kingdom, MD PhD [facility]

## 2020-12-31 NOTE — Patient Instructions (Addendum)
Please stop: - Ozempic   Please increase - Lantus/Toujeo 40 units at bedtime (increase by 4units every 4 days unstil sugars in am are <140)  Change: - Lyumjev 12-18 units before each meal  Please return in 1.5 months.

## 2021-01-04 ENCOUNTER — Encounter: Payer: Self-pay | Admitting: Internal Medicine

## 2021-01-05 ENCOUNTER — Other Ambulatory Visit: Payer: Self-pay | Admitting: Internal Medicine

## 2021-01-05 DIAGNOSIS — E1165 Type 2 diabetes mellitus with hyperglycemia: Secondary | ICD-10-CM

## 2021-01-10 ENCOUNTER — Encounter: Payer: 59 | Admitting: Dermatology

## 2021-01-14 ENCOUNTER — Ambulatory Visit (INDEPENDENT_AMBULATORY_CARE_PROVIDER_SITE_OTHER): Payer: 59 | Admitting: Internal Medicine

## 2021-01-14 ENCOUNTER — Other Ambulatory Visit: Payer: Self-pay

## 2021-01-14 ENCOUNTER — Encounter: Payer: Self-pay | Admitting: Internal Medicine

## 2021-01-14 VITALS — BP 128/80 | HR 83 | Temp 97.7°F | Ht 64.0 in | Wt 154.0 lb

## 2021-01-14 DIAGNOSIS — E1149 Type 2 diabetes mellitus with other diabetic neurological complication: Secondary | ICD-10-CM | POA: Diagnosis not present

## 2021-01-14 DIAGNOSIS — Z0000 Encounter for general adult medical examination without abnormal findings: Secondary | ICD-10-CM

## 2021-01-14 DIAGNOSIS — I10 Essential (primary) hypertension: Secondary | ICD-10-CM | POA: Diagnosis not present

## 2021-01-14 DIAGNOSIS — N1831 Chronic kidney disease, stage 3a: Secondary | ICD-10-CM | POA: Insufficient documentation

## 2021-01-14 DIAGNOSIS — N179 Acute kidney failure, unspecified: Secondary | ICD-10-CM

## 2021-01-14 LAB — HM DIABETES FOOT EXAM

## 2021-01-14 NOTE — Progress Notes (Signed)
Subjective:    Patient ID: Shannon [name], female    DOB: 01/05/1956, 65 y.o.   MRN: [id number]  HPI Here for physical This visit occurred during the SARS-CoV-2 public health emergency.  Safety protocols were in place, including screening questions prior to the visit, additional usage of staff PPE, and extensive cleaning of exam room while observing appropriate contact time as indicated for disinfecting solutions.   Is on long acting and short acting insulin Dr Cruzita Lederer is considering insulin pump Past ozempic---some recurrence of nausea (off temporarily) Sugars still often over 200 or even 300. Recently--more under 200 at least  Stomach mostly calmed down  Recent allergy testing on blood work at [facility] Suggested she needs immunologist  Sore on bottom of right foot---for 3 days No injury ?swelling No fever, etc   Review of Systems  Constitutional:       Gained back 8# Wears seat belt  HENT: Positive for tinnitus. Negative for dental problem.        Minimal hearing loss Keeps up with dentist  Eyes:       Vision blurry---going back soon  Respiratory: Positive for chest tightness. Negative for cough and shortness of breath.        Has started walking more  Cardiovascular: Negative for palpitations and leg swelling.       Heart rate always fast  Gastrointestinal: Positive for blood in stool and constipation.  Endocrine: Negative for polydipsia and polyuria.  Genitourinary: Positive for dyspareunia and frequency. Negative for dysuria and hematuria.       Libido is down  Musculoskeletal: Positive for arthralgias. Negative for back pain and joint swelling.  Skin: Negative for rash.  Allergic/Immunologic: Positive for environmental allergies. Negative for immunocompromised state.       Regular nasal spray and zyrtec  Neurological: Positive for headaches. Negative for dizziness, syncope and light-headedness.       Burning in feet and decreased sensation in toes   Hematological: Does not bruise/bleed easily.       Gland in left neck---up and down?  Psychiatric/Behavioral: Negative for dysphoric mood and sleep disturbance. The patient is not nervous/anxious.        Objective:   Physical Exam Constitutional:      Appearance: Normal appearance.  HENT:     Right Ear: Tympanic membrane, ear canal and external ear normal.     Left Ear: Tympanic membrane, ear canal and external ear normal.     Mouth/Throat:     Pharynx: No oropharyngeal exudate or posterior oropharyngeal erythema.  Eyes:     Conjunctiva/sclera: Conjunctivae normal.     Pupils: Pupils are equal, round, and reactive to light.  Cardiovascular:     Rate and Rhythm: Normal rate and regular rhythm.     Pulses: Normal pulses.     Heart sounds: No murmur heard. No gallop.   Pulmonary:     Effort: Pulmonary effort is normal.     Breath sounds: Normal breath sounds. No wheezing or rales.  Abdominal:     Palpations: Abdomen is soft.     Tenderness: There is no abdominal tenderness.  Musculoskeletal:     Cervical back: Neck supple.     Right lower leg: No edema.     Left lower leg: No edema.  Lymphadenopathy:     Cervical: No cervical adenopathy.  Skin:    General: Skin is warm.     Findings: No rash.     Comments: No foot ulcers or lesions.  Slight puffiness right plantar foot--no infection  Neurological:     Mental Status: She is alert and oriented to person, place, and time.     Comments: Fairly normal sensation in feet  Psychiatric:        Mood and Affect: Mood normal.        Behavior: Behavior normal.            Assessment & Plan:

## 2021-01-14 NOTE — Assessment & Plan Note (Signed)
BP Readings from Last 3 Encounters:  01/14/21 128/80  12/31/20 130/80  12/23/20 138/75   Good control on triamterene/HCTZ and metoprolol

## 2021-01-14 NOTE — Assessment & Plan Note (Signed)
GFR down in ER Likely from dehydration Will recheck once her sugars are better

## 2021-01-14 NOTE — Assessment & Plan Note (Signed)
Has had health challenges with the diabetes control, etc Had colon 07/23/20---due in 10 years Recent mammogram--every 2 years No pap due to hyster Needs to increase exercise---consider cardiology if ongoing chest tightness

## 2021-01-14 NOTE — Assessment & Plan Note (Signed)
Now poorly controlled Working with Dr Cruzita Lederer

## 2021-01-18 ENCOUNTER — Encounter: Payer: 59 | Attending: Internal Medicine | Admitting: Nutrition

## 2021-01-18 ENCOUNTER — Other Ambulatory Visit: Payer: 59

## 2021-01-18 ENCOUNTER — Other Ambulatory Visit: Payer: Self-pay

## 2021-01-18 DIAGNOSIS — E1165 Type 2 diabetes mellitus with hyperglycemia: Secondary | ICD-10-CM

## 2021-01-18 DIAGNOSIS — E1142 Type 2 diabetes mellitus with diabetic polyneuropathy: Secondary | ICD-10-CM

## 2021-01-18 DIAGNOSIS — Z794 Long term (current) use of insulin: Secondary | ICD-10-CM | POA: Insufficient documentation

## 2021-01-19 LAB — C-PEPTIDE: C-Peptide: 2.69 ng/mL (ref 0.80–3.85)

## 2021-01-19 LAB — GLUCOSE, FASTING: Glucose, Bld: 208 mg/dL — ABNORMAL HIGH (ref 65–99)

## 2021-01-19 NOTE — Progress Notes (Signed)
We discussed insulin pump therapy, how they work, advantages and disadvantage of this kind of therapy, and cost.  She was shown the 3 models and said she would be interested in either the OmniPod dash or Tandem pump.  She was given brochures on each model.  We discussed advantages and disadvantages of each model, and she admits that she can not focus on this right now.  Her sister died this past weekend and she is now the only sibling in the family, with a elderly parent to take care of.  She was told to put this aside and call me if she wants to discuss this at a later date.  She agreed to do this. She said that Dr. Cruzita Lederer thought she may be a type 1 diabetic and wanted to know if she could get the lab work done today to determine if she is a type I.  She is fasting,and will go to the lab for this when she finishes here.  She was told that the results will be ready in 3 days.  She had no final questions.

## 2021-01-19 NOTE — Patient Instructions (Signed)
Please take care of personal business, and when you are ready to discuss/decide which pump, and need assistance, let me know

## 2021-01-20 ENCOUNTER — Encounter: Payer: Self-pay | Admitting: Internal Medicine

## 2021-01-20 LAB — HM DIABETES EYE EXAM

## 2021-02-03 ENCOUNTER — Telehealth: Payer: Self-pay

## 2021-02-03 MED ORDER — FREESTYLE LIBRE 2 SENSOR MISC: 1.0000 | 12 refills | Status: DC

## 2021-02-03 MED ORDER — FREESTYLE LIBRE 2 READER DEVI: 1.0000 | Freq: Once | 0 refills | Status: DC

## 2021-02-03 NOTE — Telephone Encounter (Signed)
Regular Freestyle Elenor Legato is no longer being made. Need to send in [place] 2 sensors and reader.

## 2021-02-03 NOTE — Telephone Encounter (Signed)
Rx sent electronically.  

## 2021-02-15 ENCOUNTER — Encounter: Payer: 59 | Admitting: Nutrition

## 2021-02-15 ENCOUNTER — Encounter: Payer: Self-pay | Admitting: Internal Medicine

## 2021-02-15 ENCOUNTER — Ambulatory Visit (INDEPENDENT_AMBULATORY_CARE_PROVIDER_SITE_OTHER): Payer: 59 | Admitting: Internal Medicine

## 2021-02-15 ENCOUNTER — Other Ambulatory Visit: Payer: Self-pay

## 2021-02-15 VITALS — BP 130/82 | HR 103 | Ht 64.0 in | Wt 158.8 lb

## 2021-02-15 DIAGNOSIS — E1165 Type 2 diabetes mellitus with hyperglycemia: Secondary | ICD-10-CM

## 2021-02-15 DIAGNOSIS — E1142 Type 2 diabetes mellitus with diabetic polyneuropathy: Secondary | ICD-10-CM

## 2021-02-15 DIAGNOSIS — E782 Mixed hyperlipidemia: Secondary | ICD-10-CM

## 2021-02-15 DIAGNOSIS — Z794 Long term (current) use of insulin: Secondary | ICD-10-CM

## 2021-02-15 LAB — POCT GLYCOSYLATED HEMOGLOBIN (HGB A1C): Hemoglobin A1C: 10.4 % — AB (ref 4.0–5.6)

## 2021-02-15 MED ORDER — OMNIPOD DASH PODS (GEN 4) MISC: 3 refills | Status: DC

## 2021-02-15 MED ORDER — DEXCOM G6 TRANSMITTER MISC: 1.0000 | 3 refills | Status: DC

## 2021-02-15 MED ORDER — DEXCOM G6 SENSOR MISC: 1.0000 | 3 refills | Status: AC

## 2021-02-15 NOTE — Progress Notes (Signed)
Patient ID: Shannon [name], female   DOB: Aug 09, 1956, 65 y.o.   MRN: [id number]   This visit occurred during the SARS-CoV-2 public health emergency.  Safety protocols were in place, including screening questions prior to the visit, additional usage of staff PPE, and extensive cleaning of exam room while observing appropriate contact time as indicated for disinfecting solutions.   HPI: Shannon [name] is a 65 y.o.-year-old female, initially referred by her PCP, Dr. Silvio Pate, returning for follow-up for DM2, dx in 2012, insulin-dependent since ~2019, uncontrolled, with complications (peripheral neuropathy). Last visit 1.5 months ago.  Interim history: At this visit, sugars remain very high.  She just saw the diabetes educator and was taught how to carb count in preparation for starting insulin pump.  She has no new complaints at this visit.  Reviewed history: Patient described that her sugars worsened after she had to stop Metformin due to GI symptoms and was changed to sulfonylurea. They increased even further after steroid injection for Achilles tendinitis earlier this month.  Her sugars were initially in the 300-400 range, but then increased to 500-HI (>600) >> she presented to the emergency room (11/12/2020).  Afterwards, she started to work on her diet >> she tells me that she was barely eating anything throughout the day. Sugars improved some, but they were still elevated.  Reviewed HbA1c levels: Lab Results  Component Value Date   HGBA1C 9.0 (A) 11/16/2020   HGBA1C 7.8 (A) 07/13/2020   HGBA1C 8.1 (H) 12/22/2019   HGBA1C 7.3 (A) 07/04/2019   HGBA1C 8.9 (H) 01/22/2019   HGBA1C 8.8 (H) 10/15/2018   HGBA1C 7.9 (H) 02/13/2018   HGBA1C 7.0 (H) 09/19/2017   HGBA1C 8.6 (H) 04/13/2017   HGBA1C 8.2 (H) 11/16/2016   HGBA1C 7.4 (H) 05/17/2016   HGBA1C 7.8 (H) 11/19/2015   HGBA1C 7.2 (H) 04/23/2015   HGBA1C 8.4 (H) 01/14/2015   HGBA1C 8.6 (H) 09/22/2014   HGBA1C 8.0 (H) 03/31/2014   HGBA1C 6.9  (H) 09/23/2013   HGBA1C 7.0 (H) 03/17/2013   HGBA1C 6.5 09/16/2012   HGBA1C 6.6 (H) 02/20/2012   HGBA1C 9.0 (H) 10/19/2011   Previously on: - Glimepiride 2 >> 4 mg before breakfast   - Onglyza 5 mg before breakfast - Lantus 30-35 units at bedtime She is intolerant to Metformin >> N/V/D >> had to stop and changed to Glimepiride.  Now on: - Ozempic 0.5 mg weekly >> stopped 2/2 nausea - Lantus 26 >> 34 >> Toujeo 46 units at bedtime - Lyumjev 6-10 >> 12-18 >> 15 >> 20 units before each meal  She checks her sugars more than 4 times a day with her freestyle libre 2 CGM.   Previously:   Lowest sugar was 70s >> 120 >> 145; she has hypoglycemia awareness at 110.  Highest sugar was HI >> 300s >> 400.  Glucometer: One Touch Verio  Pt's meals are: - Breakfast: usually skips, when eats b'fast: sausage bisquit - Lunch: salad or sandwich - Dinner: meat and 2 veggies - Snacks: 1-2: apples, oranges, occasionally chips  -+ CKD, last BUN/creatinine:  Lab Results  Component Value Date   BUN 19 12/23/2020   BUN 23 11/11/2020   CREATININE 1.22 (H) 12/23/2020   CREATININE 1.14 (H) 11/11/2020  She is not on ACE inhibitor/ARB. She had cough with lisinopril..  -+ HL; last set of lipids: Lab Results  Component Value Date   CHOL 180 12/22/2019   HDL 48.70 12/22/2019   LDLCALC 126 (H) 12/14/2016  LDLDIRECT 105.0 12/22/2019   TRIG 335.0 (H) 12/22/2019   CHOLHDL 4 12/22/2019  On Crestor 5 mg 3 times a week.  - last eye exam was in 08/2020: No DR. She has glaucoma - exams q6 mo.  -She has numbness and tingling in her toes. She also has muscle cramps in her feet.  Pt has FH of DM in mother, son, daughter, sister, MGM, MGF, M aunt and uncles.  She also has a history of HTN, fatty liver, GERD.  She presented to the ED for nausea on 12/24/2019.  Per review of the labs, she appeared dehydrated, but no distinct pathology was found.  Of note, lipase was normal.  ROS: Constitutional: no  weight gain/no weight loss, + fatigue, no subjective hyperthermia, no subjective hypothermia Eyes: + blurry vision, no xerophthalmia ENT: no sore throat, no nodules palpated in neck, no dysphagia, no odynophagia, no hoarseness Cardiovascular: no CP/no SOB/no palpitations/no leg swelling Respiratory: no cough/no SOB/no wheezing Gastrointestinal: + N/no V/no D/no C/no acid reflux Musculoskeletal: + muscle aches/no joint aches Skin: no rashes, + hair loss Neurological: no tremors/no numbness/no tingling/no dizziness  I reviewed pt's medications, allergies, PMH, social hx, family hx, and changes were documented in the history of present illness. Otherwise, unchanged from my initial visit note.  Past Medical History:  Diagnosis Date  . Allergy   . Arthritis    hip, back, shoulders, joints  . Asthma   . Complication of anesthesia    problems with block during breast augmentation and epidural during childbirth  . Depression   . Diabetes mellitus 11/12   type 2  . Family history of adverse reaction to anesthesia    PROBLEMS WITH BLOCKS  . Fatty liver   . GERD (gastroesophageal reflux disease)   . Glaucoma suspect   . Headache    migraines-4x a week headache-1x week migraine  . History of hiatal hernia   . Hyperlipidemia   . Hypertension    Controlled on meds  . Mitral valve prolapse   . 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 Osteopenia   . Pneumonia   . PONV (postoperative nausea and vomiting)   . Shingles 11/2006  . Sleep disturbance   . Tachycardia   . Vertigo    Hx of   Past Surgical History:  Procedure Laterality Date  . ABDOMINAL HYSTERECTOMY  2000   /BSO - abn. [facility]  . APPENDECTOMY  1978  . AUGMENTATION MAMMAPLASTY Bilateral 1981  . BLADDER REPAIR     x2  . BREAST ENHANCEMENT SURGERY  1987  . [place] Left 12/18/2016   Procedure: Left Heart Cath and Coronary Angiography;  Surgeon: Wellington Hampshire, MD;  Location:  [facility];  Service: Cardiovascular;  Laterality: Left;  . CARPAL TUNNEL RELEASE Right   . CERVICAL DISCECTOMY  11/2009   with synthetic discs inserted--  Dr Arnoldo Morale  . COLONOSCOPY    . JOINT REPLACEMENT    . NM MYOVIEW LTD  03/2005   EF 56%, no ischemia  . RECTAL PROLAPSE REPAIR     x1  . SHOULDER ARTHROSCOPY WITH OPEN ROTATOR CUFF REPAIR Left 12/29/2019   Procedure: LEFT SHOULDER ARTHROSCOPIC DISTAL CLAVICLE EXCISISON, SUBACROMIAL DECOMPRESSION,BICEPS [place] ;  Surgeon: Lovell Sheehan, MD;  Location: [facility];  Service: Orthopedics;  Laterality: Left;  . SHOULDER CLOSED REDUCTION Left 05/03/2020   Procedure: Left shoulder arthroscopic lysis of adhesions and manipulation under anesthesia;  Surgeon: Lovell Sheehan, MD;  Location: [facility];  Service: Orthopedics;  Laterality: Left;  . SHOULDER SURGERY  09/2008, 01/2009   Right shoulder surgery, then redone with lysis of adhesions  . TOTAL HIP ARTHROPLASTY Right 05/29/2018   Procedure: TOTAL HIP ARTHROPLASTY ANTERIOR APPROACH;  Surgeon: Lovell Sheehan, MD;  Location: [facility];  Service: Orthopedics;  Laterality: Right;  . UPPER GI ENDOSCOPY     Social History   Socioeconomic History  . Marital status: Married    Spouse name: Not on file  . Number of children: 5  . Years of education: Not on file  . Highest education level: Not on file  Occupational History  . Occupation: homemaker  Tobacco Use  . Smoking status: Never Smoker  . Smokeless tobacco: Never Used  Vaping Use  . Vaping Use: Never used  Substance and Sexual Activity  . Alcohol use: No  . Drug use: No  . Sexual activity: Not on file  Other Topics Concern  . Not on file  Social History Narrative   Home maker   Doesn't exercise      [name] sister         Social Determinants of Health   Financial Resource Strain: Not on file  Food Insecurity: Not on file  Transportation Needs: Not on file  Physical Activity: Not on file   Stress: Not on file  Social Connections: Not on file  Intimate Partner Violence: Not on file   Current Outpatient Medications on File Prior to Visit  Medication Sig Dispense Refill  . aspirin-acetaminophen-caffeine (EXCEDRIN MIGRAINE) 250-250-65 MG tablet Take 1 tablet by mouth every 6 (six) hours as needed for headache.    . Blood Glucose Monitoring Suppl (FIFTY50 GLUCOSE METER 2.0) w/Device KIT OneTouch Ultra2 Meter kit    . cetirizine (ZYRTEC) 10 MG tablet Take 10 mg by mouth daily.     . clobetasol (TEMOVATE) 0.05 % external solution Apply 1 application topically as directed. 1-2 gtts qd/bid aa scalp prn flares, avoid face, groin, axilla 50 mL 0  . Continuous Blood Gluc Sensor (FREESTYLE LIBRE 2 SENSOR) MISC 1 each by Does not apply route every 14 (fourteen) days. Dx Code E11.59 2 each 12  . docusate sodium (COLACE) 100 MG capsule Take 300 mg by mouth at bedtime.     . fluticasone (FLONASE) 50 MCG/ACT nasal spray Place 1 spray into both nostrils daily.    . insulin glargine, 2 Unit Dial, (TOUJEO MAX SOLOSTAR) 300 UNIT/ML Solostar Pen Inject 44 units under skin daily 9 mL 3  . Insulin Lispro-aabc, 1 U Dial, (LYUMJEV KWIKPEN) 100 UNIT/ML SOPN Inject 8-10 Units into the skin 3 (three) times daily. 15 mL 3  . Insulin Pen Needle 32G X 4 MM MISC Use 4x a day 200 each 3  . LUMIGAN 0.01 % SOLN Place 1 drop into both eyes at bedtime.     . metoprolol succinate (TOPROL-XL) 25 MG 24 hr tablet TAKE 1 AND 1/2 TABLETS BY  MOUTH DAILY 135 tablet 3  . mometasone (ELOCON) 0.1 % cream Apply 1 application topically as directed. Qd to bid aa hands prn flares 45 g 1  . ondansetron (ZOFRAN-ODT) 4 MG disintegrating tablet Take 1 tablet (4 mg total) by mouth every 8 (eight) hours as needed. 20 tablet 5  . ONETOUCH DELICA LANCETS FINE MISC Check blood sugar once daily and as needed. 250.00 100 each 1  . ONETOUCH ULTRA test strip USE TO CHECK BLOOD SUGAR 2  TIMES DAILY 200 strip 3  . pantoprazole (PROTONIX) 40 MG  tablet TAKE 1 TABLET BY MOUTH 2  TIMES DAILY ON AN EMPTY  STOMACH (Patient taking differently: Take 40 mg by mouth 2 (two) times daily.) 180 tablet 3  . rosuvastatin (CRESTOR) 5 MG tablet TAKE 1 TABLET BY MOUTH 3  TIMES WEEKLY 39 tablet 3  . sucralfate (CARAFATE) 1 g tablet Take 1 tablet (1 g total) by mouth in the morning, at noon, and at bedtime. 270 tablet 3  . traZODone (DESYREL) 100 MG tablet TAKE 2 TABLETS BY MOUTH  EVERY NIGHT AT BEDTIME 180 tablet 3  . triamterene-hydrochlorothiazide (MAXZIDE-25) 37.5-25 MG tablet TAKE 1 TABLET BY MOUTH ONCE DAILY 90 tablet 3  . valACYclovir (VALTREX) 1000 MG tablet TAKE 2 TABLETS BY MOUTH  AT ONSET OF SYMPTOMS AS NEEDED FOR FEVER BLISTERS AND TAKE 2 TABLETS 12 HOURS LATER. 90 tablet 3   No current facility-administered medications on file prior to visit.   Allergies  Allergen Reactions  . Lisinopril Cough  . Nitrofurantoin Other (See Comments)    Pt unsure  . Oxycodone-Acetaminophen Itching  . Atorvastatin Diarrhea    May have caused colitis--but not clear cut  . Clarithromycin Palpitations  . Glipizide Other (See Comments)    Sugars too variable  . Invokana [Canagliflozin]     Nausea, constipation   Family History  Problem Relation Age of Onset  . Diabetes Mother   . Heart disease Mother   . Hypertension Mother   . Ovarian cancer Mother   . Melanoma Mother   . Cancer Father   . Coronary artery disease Father   . Heart disease Father   . Diabetes Sister   . Depression Sister   . Coronary artery disease Sister   . Heart disease Sister   . Rheum arthritis Maternal Aunt   . Breast cancer Maternal Grandmother   . Diabetes Maternal Grandmother   . Heart attack Paternal Uncle   . Heart attack Cousin   . Heart attack Paternal Uncle   . Diabetes Maternal Grandfather   . Colon cancer Neg Hx     PE: BP 130/82 (BP Location: Left Arm, Patient Position: Sitting, Cuff Size: Normal)   Pulse (!) 103   Ht _0  (1.626 m)   Wt 158 lb 12.8 oz  (72 kg)   SpO2 96%   BMI 27.26 kg/m  Wt Readings from Last 3 Encounters:  02/15/21 158 lb 12.8 oz (72 kg)  01/14/21 154 lb (69.9 kg)  12/31/20 153 lb 6.4 oz (69.6 kg)   Constitutional: normal weight, in NAD Eyes: PERRLA, EOMI, no exophthalmos ENT: moist mucous membranes, no thyromegaly, no cervical lymphadenopathy Cardiovascular: Tachycardia, RR, No MRG Respiratory: CTA B Gastrointestinal: abdomen soft, NT, ND, BS+ Musculoskeletal: no deformities, strength intact in all 4 Skin: moist, warm, no rashes Neurological: no tremor with outstretched hands, DTR normal in all 4  ASSESSMENT: 1. DM2, insulin-dependent, uncontrolled, without long-term complications, but with significant hyperglycemia  2. HL  PLAN:  1. Patient with longstanding, uncontrolled, type 2 diabetes, on basal-bolus insulin regimen, which we adjusted at last visit after starting the weekly GLP-1 receptor agonist due to nausea.  At last visit, reviewing her CGM trends, it appears that her sugars were above normal range at all times of the day, with slight fluctuations.  The most prominent pattern was a slight increase in blood sugars after an initial hyperglycemic peak after lunch and increase after dinner.  Sugars were stable overnight, all  above target.  Since she was constantly nauseated at last visit, I advised her to stop Ozempic and we increased her Toujeo dose.  Sugars worsened after stopping Ozempic and she had to increase the dose of Toujeo and Lyumjev since last visit. CGM interpretation: -At today's visit, we reviewed her CGM downloads: It appears that 7% of values are in target range (goal >70%), while 93% are higher than 180 (goal <25%), and 0% are lower than 70 (goal <4%).  The calculated average blood sugar is 275.  The projected HbA1c for the next 3 months (GMI) is 9.9%. -Reviewing the CGM trends, the sugars are very high during the night and they decrease slightly during the day, but remain significantly above  target.  In fact, the review of the blood sugars are in target range.  Reportedly, her Nelva Nay is not expired and she is using 46 units daily.  We will increase this to 54 units and then even further, to 60 units if needed.  Since the sugars remain high throughout the day, I advised her to use between 20 to 26 units of Lyumjev before each meal -At this visit, I sent the Omnipod pump supplies to her pharmacy.  I will also send a prescription for the Dexcom sensor and transmitter.  I advised her to try to change to the OmniPod pump as soon as possible.  She will keep in touch with the diabetes educator.  I advised the patient to stay in touch with me before she started the pump to see if we need to increase the insulin doses even further. - I suggested to:  Patient Instructions  Please increase: - Toujeo 54 units at bedtime (may need to increase further to 60 units) - Lyumjev 20-26 units before each meal  Change to Omnipod ASAP.  Please return in 1.5 months.   - we checked her HbA1c: 10.4% (higher) - advised to check sugars at different times of the day - 4x a day, rotating check times - advised for yearly eye exams >> she is UTD - return to clinic in 1.5 months  2. HL -Reviewed latest lipid panel from a year ago: LDL above target, triglycerides high: Lab Results  Component Value Date   CHOL 180 12/22/2019   HDL 48.70 12/22/2019   LDLCALC 126 (H) 12/14/2016   LDLDIRECT 105.0 12/22/2019   TRIG 335.0 (H) 12/22/2019   CHOLHDL 4 12/22/2019  -Continues on Crestor 5 mg 3 times a week without side effects -She is due for another lipid panel, but will wait to check at next visit with hopefully better blood sugars.  Philemon Kingdom, MD PhD [facility] Endocrinology

## 2021-02-15 NOTE — Patient Instructions (Addendum)
Please increase: - Toujeo 54 units at bedtime (may need to increase further to 60 units) - Lyumjev 20-26 units before each meal  Change to Omnipod ASAP.  Please return in 1.5 months.

## 2021-02-16 NOTE — Progress Notes (Signed)
Pt. Is here with her daughter to get a insulin pump.  She has chosen the [employer]. She was given a Dexcom and instructed on how to use this.  Lot#: [id number]  Exp.: 7/22.  She inserted this into her left outer arm, and readings were sent to her phone.  The readings were linked to [place] [facility].   Paperwork was filled out and faxed in for PDM and she was told to call me when she gets her pods as well as her PDM.   We also dicussed how to carb count.  Written information on carb portions and estimating amounts given to her.  She reported good understanding of this and had no final questions. Orders were signed by Dr. Cruzita Lederer for the PDM and faxed to [facility].Pt. was told to call me when she gets all of her supplies in, to set up appointment for pump training.  She agreed to do this and had no final questions.

## 2021-02-16 NOTE — Patient Instructions (Addendum)
Call me when you get your PDM and pods to set up an appointment for pump training. Read over carb counting information and call if questions.

## 2021-02-20 ENCOUNTER — Other Ambulatory Visit: Payer: Self-pay | Admitting: Internal Medicine

## 2021-02-22 ENCOUNTER — Encounter: Payer: Self-pay | Admitting: Internal Medicine

## 2021-02-22 DIAGNOSIS — E1165 Type 2 diabetes mellitus with hyperglycemia: Secondary | ICD-10-CM

## 2021-02-22 DIAGNOSIS — E1142 Type 2 diabetes mellitus with diabetic polyneuropathy: Secondary | ICD-10-CM

## 2021-02-23 ENCOUNTER — Telehealth: Payer: Self-pay | Admitting: Internal Medicine

## 2021-02-23 MED ORDER — LYUMJEV 100 UNIT/ML IJ SOLN: 20.0000 [IU] | Freq: Three times a day (TID) | INTRAMUSCULAR | 0 refills | Status: DC

## 2021-02-23 NOTE — Telephone Encounter (Signed)
New Message: Pt calling regarding Vials of insulin not being called in for  Insulin Disposable Pump (OMNIPOD DASH 5 PACK PODS) MISC  [facility] [address], [place], [place] [address] is her pharmacy

## 2021-02-24 ENCOUNTER — Other Ambulatory Visit: Payer: Self-pay | Admitting: Internal Medicine

## 2021-02-24 ENCOUNTER — Encounter: Payer: Self-pay | Admitting: Internal Medicine

## 2021-02-24 ENCOUNTER — Telehealth: Payer: Self-pay | Admitting: Dietician

## 2021-02-24 DIAGNOSIS — E1165 Type 2 diabetes mellitus with hyperglycemia: Secondary | ICD-10-CM

## 2021-02-24 MED ORDER — LYUMJEV 100 UNIT/ML IJ SOLN: INTRAMUSCULAR | 3 refills | Status: DC

## 2021-02-24 NOTE — Telephone Encounter (Signed)
E-Prescribing Status: Receipt confirmed by pharmacy (02/23/2021  3:52 PM EDT)

## 2021-02-24 NOTE — Telephone Encounter (Signed)
Patient called back to advise that only 1 vial of insulin Lumjev was sent to pharmacy.  At her last appointment based on her dosing needs patient was probably going to be using 1 pod every 2 days (or a bit less)  Patient was sent 15 pods for 30 days, but only enough medicine to fill 1 of the pods for two days of deliver.  Patient needs more Lumjev to reflect correct 30 day dosing.    Please call patient at [phone]

## 2021-02-24 NOTE — Telephone Encounter (Signed)
Returned patient call. Patient states that the Omnipod PDM should arrive next week and then she will be ready for training.  She is to call for an appointment for training Vaughan Basta next week.  She had an insulin question but Dr. Cruzita Lederer has answered this.  She is currently wearing a Dexcom and stated that it will be time to change this tomorrow.  Gave her some tips about this.  She is to call for any questions.  Antonieta Iba, RD, LDN, CDCES

## 2021-02-24 NOTE — Telephone Encounter (Signed)
Rx sent 

## 2021-02-25 ENCOUNTER — Telehealth: Payer: Self-pay | Admitting: Internal Medicine

## 2021-02-25 MED ORDER — OMNIPOD DASH PDM (GEN 4) KIT: PACK | 0 refills | Status: DC

## 2021-02-25 NOTE — Addendum Note (Signed)
Addended by: Lauralyn Primes on: 02/25/2021 11:38 AM   Modules accepted: Orders

## 2021-02-25 NOTE — Telephone Encounter (Signed)
Called and spoke with pt advising rx for omnipod reader has been sent to preferred pharmacy. Pt advised to contact pharmacy to confirm Lyumjev dosage dispensed to ensure it will be enough.

## 2021-02-25 NOTE — Telephone Encounter (Signed)
New message    Patient calling the office for samples of medication:   1.  What medication and dosage are you requesting samples for?Insulin Lispro-aabc (LYUMJEV) 100 UNIT/ML SOLN   2.  Are you currently out of this medication?  Only have 1 pen left -- needs something to get her through until her ominpod comes in

## 2021-03-01 ENCOUNTER — Other Ambulatory Visit: Payer: Self-pay | Admitting: Internal Medicine

## 2021-03-01 ENCOUNTER — Encounter: Payer: 59 | Attending: Internal Medicine | Admitting: Nutrition

## 2021-03-01 ENCOUNTER — Telehealth: Payer: Self-pay | Admitting: Internal Medicine

## 2021-03-01 ENCOUNTER — Other Ambulatory Visit: Payer: Self-pay

## 2021-03-01 DIAGNOSIS — E1165 Type 2 diabetes mellitus with hyperglycemia: Secondary | ICD-10-CM

## 2021-03-01 MED ORDER — LYUMJEV KWIKPEN 200 UNIT/ML ~~LOC~~ SOPN: PEN_INJECTOR | SUBCUTANEOUS | 3 refills | Status: DC

## 2021-03-01 NOTE — Telephone Encounter (Signed)
Spoke with pharmacist.  She was sure why patient's insulin dose had doubled in the last 4 days.  I explained that she is no longer getting the Toujeo insulin and we must make up for this loss of insulin with the Lyumjev insulin and also explained how much insulin the pod holds (17ml), and the need for the U-200 insulin (to prevent a pod change every 25hours), and how long this will last her now with U200 insulin  (2.3 days).  She reported good understanding of this and OK the insulin

## 2021-03-01 NOTE — Patient Instructions (Signed)
Read over resource manual tonight and the manual as well Call 800 pump help number if questions.

## 2021-03-01 NOTE — Telephone Encounter (Signed)
pharmacy called to get clarity on [place] - dosage and the large increase in strength and dose.  Please call [phone] ask for either pharmacy

## 2021-03-01 NOTE — Telephone Encounter (Signed)
Ocie Doyne, RN     03/01/21 2:51 PM Note Spoke with pharmacist.  She was sure why patient's insulin dose had doubled in the last 4 days.  I explained that she is no longer getting the Toujeo insulin and we must make up for this loss of insulin with the Lyumjev insulin and also explained how much insulin the pod holds (39ml), and the need for the U-200 insulin (to prevent a pod change every 25hours), and how long this will last her now with U200 insulin  (2.3 days).  She reported good understanding of this and OK the insulin.

## 2021-03-01 NOTE — Telephone Encounter (Signed)
Pt is at the pharmacy at this time and pharmacy is saying that they can not dispense the insulin until [place] sends back information that they are requiring and patient needs insulin today because tomorrow morning is the last day that she has for the insulin .please advise. Patient would like a call back   Insulin Lispro-aabc (LYUMJEV KWIKPEN) 200 UNIT/ML [place], [place]

## 2021-03-01 NOTE — Progress Notes (Signed)
Patient and daughter were there to be trained on how to use the Dash pump.  We discussed how this pump will deliver insulin 2 ways and how this will be done.  Settings were put into the pump per calculating her TDID which was 214 .  Basal rate: 3.0 u/hr.   She reports counting carbs now.  I/C: 5, ISF: 30.  Timing: 4 hours.   She filled a pod with U-100 Lyumjev insulin, and attached it to her left abdomen.  She will use a pod in 1 day, so Dr. Renne Crigler ordered U-200 Lyumjev for her to pick up at the pharmacy today.  She scheduled an appointment for tomorrow to return to make ajustment to settings and start the U-200 insulin She redemonstrated how to bolus correctly X2 and we reviewed carb counting.   She is wearing the Dexcom sensor and will use those readings to put into the PDM for bolus calculation and correction doses.   She had no final questions.

## 2021-03-02 ENCOUNTER — Other Ambulatory Visit: Payer: Self-pay

## 2021-03-02 ENCOUNTER — Encounter: Payer: 59 | Admitting: Nutrition

## 2021-03-02 DIAGNOSIS — E1165 Type 2 diabetes mellitus with hyperglycemia: Secondary | ICD-10-CM

## 2021-03-02 NOTE — Progress Notes (Signed)
Patient is here today with her daughter to change out her Humalog to U200 Humalog in the pens.  She is here to learn how to change the pod, and to have the settings changed in her PDM Basal settings changed from 3.0u/hr to 1.5u/hr, and I/C ratio changed from 5 to 10, and ISF changed from 25 to 50.   Pt. Was shown how to do a pod change, and how to use the pen to fill the pod.  She removed the old pod, saying there was some tenderness at the needle site.  The site looked red, but no swelling or discharge.  She was given some tripple antibiotic cream to use and we applied some to the red area.  She was told to do this q 8 hours and to call in 2 days if the redness or soreness remains.  She reported good understanding of this.   She filled a pod with 80ml of Humalog U200 insulin without difficulty, and applied the pod to her left abdominal area after cleaning the site with alcohol.   We reviewed again how to bolus, and she reported no difficulty doing this last night or this AM.  FBS today was 149 and patient was very pleased.  Her 3 hr.pc reading after coffee was 179.  She was told to add 0.5u to the coffee bolus in the AM due to caffeine raising blood sugar.  She agreed to do this. WE also reviewed temp basal--when and how to make changes when sick or active, how to start/stop the pump, how to use the [place] for carb amounts, and how to make changes to pump settings, high and low blood sugar protocols and sick day guidelines.  She was given a pump booklet with all of the above topics and told to review this.  She signed checklist as understanding all topics and had no final questions. Appointment scheduled with me in 2 weeks, and Dr. Cruzita Lederer in 4 weeks.

## 2021-03-02 NOTE — Patient Instructions (Addendum)
Read over handouts given and call if questions. Call office if blood sugars drop below 70  Call pump help line if questions with using the pump

## 2021-03-03 ENCOUNTER — Other Ambulatory Visit: Payer: Self-pay

## 2021-03-03 ENCOUNTER — Other Ambulatory Visit: Payer: Self-pay | Admitting: Internal Medicine

## 2021-03-03 ENCOUNTER — Telehealth: Payer: Self-pay | Admitting: Dietician

## 2021-03-03 ENCOUNTER — Other Ambulatory Visit (INDEPENDENT_AMBULATORY_CARE_PROVIDER_SITE_OTHER): Payer: 59

## 2021-03-03 ENCOUNTER — Ambulatory Visit: Payer: 59 | Admitting: Internal Medicine

## 2021-03-03 DIAGNOSIS — E1165 Type 2 diabetes mellitus with hyperglycemia: Secondary | ICD-10-CM | POA: Diagnosis not present

## 2021-03-03 DIAGNOSIS — E1142 Type 2 diabetes mellitus with diabetic polyneuropathy: Secondary | ICD-10-CM | POA: Diagnosis not present

## 2021-03-03 DIAGNOSIS — M7989 Other specified soft tissue disorders: Secondary | ICD-10-CM

## 2021-03-03 LAB — BASIC METABOLIC PANEL WITH GFR
BUN/Creatinine Ratio: 15 (calc) (ref 6–22)
BUN: 16 mg/dL (ref 7–25)
CO2: 25 mmol/L (ref 20–32)
Calcium: 9.4 mg/dL (ref 8.6–10.4)
Chloride: 102 mmol/L (ref 98–110)
Creat: 1.08 mg/dL — ABNORMAL HIGH (ref 0.50–0.99)
GFR, Est African American: 63 mL/min/{1.73_m2} (ref >=60)
GFR, Est Non African American: 55 mL/min/{1.73_m2} — ABNORMAL LOW (ref >=60)
Glucose, Bld: 263 mg/dL — ABNORMAL HIGH (ref 65–99)
Potassium: 4 mmol/L (ref 3.5–5.3)
Sodium: 138 mmol/L (ref 135–146)

## 2021-03-03 NOTE — Telephone Encounter (Signed)
Called patient after discussing with Dr. Cruzita Lederer. Instructed the patient on the following. Due to possible insulin reaction.  Insulin to change to Novolog.  Sample vials (2) - expiration 06/2021, Lot [id number] left at front desk for patient to pick up. She is to change out the pod and spray with Flonase or treat with hydrocortisone cream at the canula site to try to deter any reaction.  While on the Novolog insulin, she is to change her pump settings to the following:  (patient verbalized) Basal rate 3 units per hour Max basal rate 5 units per hour Insulin to Carb ratio:  5  Insulin sensitivity factory 25 Target 100-120   These setting will need to be changed again if she resumes the U-200 insulin.  Asked that patient call and speak with Vaughan Basta Monday to problem solve.  Patient is concerned that she will end up using 1 pod daily with current regime.  Antonieta Iba, RD, LDN, CDCES

## 2021-03-03 NOTE — Telephone Encounter (Signed)
Possible increased sensitivity/allergic reaction to insulin >> Will need to switch to NovoLog.  We will give her 2 vials of NovoLog to try.  We will also go back to the previous U100 insulin pump settings. May need to spray Flonase at the site before insertion if irritation persists until after the weekend when we can have her back to maybe change the system/cannula.

## 2021-03-08 ENCOUNTER — Other Ambulatory Visit: Payer: Self-pay | Admitting: Internal Medicine

## 2021-03-08 MED ORDER — OMNIPOD DASH PODS (GEN 4) MISC: 3 refills | Status: DC

## 2021-03-08 NOTE — Telephone Encounter (Signed)
Thank you so much, Linda!

## 2021-03-08 NOTE — Telephone Encounter (Signed)
Shannon [name], I sent the pods to [facility]. Let's increase her basal rate from approximately 9 PM to 6 AM to 3.5 units per hour and see how this works. Thank you, C

## 2021-03-08 NOTE — Telephone Encounter (Signed)
Talked patient's daughter into making the basal rate changes per Dr. Chrissie Noa orders:  MN: 3.5, 6AM; 3.0, 9PM: 3.5.

## 2021-03-08 NOTE — Telephone Encounter (Signed)
Patient reports that she is changing pods qd, and has had no redness or swelling at the pod site.  She also says that the swelling in her lower legs is not as bad.  Discussed this swelling with her, which she says appeared a day or two after starting Lyumjev insulin..  It appears it was a 1+ swelling, but denies any SOB. Says swelling now is still present, but "much less" Says FBS today was 107 Dexcom downloaded and put on Dr. Arman Filter desk.

## 2021-03-10 ENCOUNTER — Encounter: Payer: Self-pay | Admitting: Internal Medicine

## 2021-03-16 ENCOUNTER — Telehealth: Payer: Self-pay | Admitting: Internal Medicine

## 2021-03-16 ENCOUNTER — Encounter: Payer: Self-pay | Admitting: Internal Medicine

## 2021-03-16 ENCOUNTER — Other Ambulatory Visit: Payer: Self-pay

## 2021-03-16 ENCOUNTER — Ambulatory Visit: Payer: 59 | Admitting: Internal Medicine

## 2021-03-16 ENCOUNTER — Other Ambulatory Visit: Payer: Self-pay | Admitting: Internal Medicine

## 2021-03-16 VITALS — BP 140/78 | HR 85 | Temp 97.8°F | Ht 64.0 in | Wt 161.0 lb

## 2021-03-16 DIAGNOSIS — E1142 Type 2 diabetes mellitus with diabetic polyneuropathy: Secondary | ICD-10-CM

## 2021-03-16 DIAGNOSIS — R0609 Other forms of dyspnea: Secondary | ICD-10-CM | POA: Insufficient documentation

## 2021-03-16 DIAGNOSIS — R109 Unspecified abdominal pain: Secondary | ICD-10-CM | POA: Diagnosis not present

## 2021-03-16 DIAGNOSIS — I10 Essential (primary) hypertension: Secondary | ICD-10-CM | POA: Diagnosis not present

## 2021-03-16 DIAGNOSIS — R0600 Dyspnea, unspecified: Secondary | ICD-10-CM

## 2021-03-16 DIAGNOSIS — N1831 Chronic kidney disease, stage 3a: Secondary | ICD-10-CM

## 2021-03-16 DIAGNOSIS — E1165 Type 2 diabetes mellitus with hyperglycemia: Secondary | ICD-10-CM

## 2021-03-16 LAB — CBC
HCT: 36.7 % (ref 36.0–46.0)
Hemoglobin: 11.8 g/dL — ABNORMAL LOW (ref 12.0–15.0)
MCHC: 32.1 g/dL (ref 30.0–36.0)
MCV: 80 fl (ref 78.0–100.0)
Platelets: 285 10*3/uL (ref 150.0–400.0)
RBC: 4.58 Mil/uL (ref 3.87–5.11)
RDW: 14.9 % (ref 11.5–15.5)
WBC: 6.9 10*3/uL (ref 4.0–10.5)

## 2021-03-16 LAB — RENAL FUNCTION PANEL
Albumin: 4.3 g/dL (ref 3.5–5.2)
BUN: 17 mg/dL (ref 6–23)
CO2: 27 mEq/L (ref 19–32)
Calcium: 9.8 mg/dL (ref 8.4–10.5)
Chloride: 104 mEq/L (ref 96–112)
Creatinine, Ser: 0.9 mg/dL (ref 0.40–1.20)
GFR: 68.23 mL/min (ref >60.00)
Glucose, Bld: 129 mg/dL — ABNORMAL HIGH (ref 70–99)
Phosphorus: 3.3 mg/dL (ref 2.3–4.6)
Potassium: 4.4 mEq/L (ref 3.5–5.1)
Sodium: 139 mEq/L (ref 135–145)

## 2021-03-16 LAB — TSH: TSH: 0.99 u[IU]/mL (ref 0.35–4.50)

## 2021-03-16 LAB — SEDIMENTATION RATE: Sed Rate: 30 mm/hr (ref 0–30)

## 2021-03-16 MED ORDER — INSULIN ASPART 100 UNIT/ML ~~LOC~~ SOLN: 175.0000 [IU] | Freq: Every day | SUBCUTANEOUS | 1 refills | Status: DC

## 2021-03-16 MED ORDER — LOSARTAN POTASSIUM 25 MG PO TABS: 25.0000 mg | ORAL_TABLET | Freq: Every day | ORAL | 3 refills | Status: DC

## 2021-03-16 NOTE — Assessment & Plan Note (Signed)
With some chest tightness, edema and weight gain Will check labs Suspicious for new diastolic CHF--echo and cath fine some years ago, will recheck echo Start losartan 25mg  as well Consider furosemide

## 2021-03-16 NOTE — Telephone Encounter (Signed)
Rx sent to preferred pharmacy.

## 2021-03-16 NOTE — Telephone Encounter (Signed)
Patient [place], [place] has submitted a request for refill for Novolog and they have not received it yet.  Patient called to check on refill request.  Please follow up with pharmacy and patient.

## 2021-03-16 NOTE — Assessment & Plan Note (Signed)
Mildly reduced GFR goes back for some time May need to consider nephrology Try low dose losartan

## 2021-03-16 NOTE — Telephone Encounter (Signed)
PA initiated for rx

## 2021-03-16 NOTE — Assessment & Plan Note (Signed)
BP Readings from Last 3 Encounters:  03/16/21 140/78  02/15/21 130/82  01/14/21 128/80   Running high at home Will add losartan 25mg 

## 2021-03-16 NOTE — Telephone Encounter (Signed)
Patient's novolog is in the fridge by the side door ready for pick up. (With envelope and green sticky note) FYI

## 2021-03-16 NOTE — Progress Notes (Signed)
Subjective:    Patient ID: Shannon [name], female    DOB: 1956-08-06, 65 y.o.   MRN: [id number]  HPI Here due to rapid weight gain and fluid This visit occurred during the SARS-CoV-2 public health emergency.  Safety protocols were in place, including screening questions prior to the visit, additional usage of staff PPE, and extensive cleaning of exam room while observing appropriate contact time as indicated for disinfecting solutions.   Has noted elevated blood pressure-- 163/90 was highest Notes pitting in ankles at times--usually more in evenings Weight up about 15# in short time Some drooping in her eyes Now with DOE---even just walking across a parking lot Some right CVA pain--comes and goes  Sugars are better Insulin pump working great--Omni pod Sugar 107 now---has Dexcom  Current Outpatient Medications on File Prior to Visit  Medication Sig Dispense Refill  . aspirin-acetaminophen-caffeine (EXCEDRIN MIGRAINE) 250-250-65 MG tablet Take 1 tablet by mouth every 6 (six) hours as needed for headache.    . Blood Glucose Monitoring Suppl (FIFTY50 GLUCOSE METER 2.0) w/Device KIT OneTouch Ultra2 Meter kit    . cetirizine (ZYRTEC) 10 MG tablet Take 10 mg by mouth daily.     . clobetasol (TEMOVATE) 0.05 % external solution Apply 1 application topically as directed. 1-2 gtts qd/bid aa scalp prn flares, avoid face, groin, axilla 50 mL 0  . Continuous Blood Gluc Receiver (Elizabeth) DEVI by Does not apply route.    . Continuous Blood Gluc Transmit (DEXCOM G6 TRANSMITTER) MISC 1 Device by Does not apply route every 3 (three) months. 1 each 3  . docusate sodium (COLACE) 100 MG capsule Take 300 mg by mouth at bedtime.     . fluticasone (FLONASE) 50 MCG/ACT nasal spray Place 1 spray into both nostrils daily.    . Insulin Disposable Pump (OMNIPOD DASH 5 PACK PODS) MISC Use 1 every day 18 each 3  . Insulin Disposable Pump (OMNIPOD DASH SYSTEM) KIT Use as instructed 1 kit 0  . LUMIGAN  0.01 % SOLN Place 1 drop into both eyes at bedtime.     . metoprolol succinate (TOPROL-XL) 25 MG 24 hr tablet TAKE 1 AND 1/2 TABLETS BY  MOUTH DAILY 135 tablet 3  . mometasone (ELOCON) 0.1 % cream Apply 1 application topically as directed. Qd to bid aa hands prn flares 45 g 1  . ondansetron (ZOFRAN-ODT) 4 MG disintegrating tablet Take 1 tablet (4 mg total) by mouth every 8 (eight) hours as needed. 20 tablet 5  . ONETOUCH DELICA LANCETS FINE MISC Check blood sugar once daily and as needed. 250.00 100 each 1  . ONETOUCH ULTRA test strip USE TO CHECK BLOOD SUGAR 2  TIMES DAILY 200 strip 3  . pantoprazole (PROTONIX) 40 MG tablet TAKE 1 TABLET BY MOUTH  TWICE DAILY ON AN EMPTY  STOMACH 180 tablet 3  . rosuvastatin (CRESTOR) 5 MG tablet TAKE 1 TABLET BY MOUTH 3  TIMES WEEKLY 39 tablet 3  . sucralfate (CARAFATE) 1 g tablet Take 1 tablet (1 g total) by mouth in the morning, at noon, and at bedtime. 270 tablet 3  . traZODone (DESYREL) 100 MG tablet TAKE 2 TABLETS BY MOUTH  EVERY NIGHT AT BEDTIME 180 tablet 3  . triamterene-hydrochlorothiazide (MAXZIDE-25) 37.5-25 MG tablet TAKE 1 TABLET BY MOUTH ONCE DAILY 90 tablet 3  . valACYclovir (VALTREX) 1000 MG tablet TAKE 2 TABLETS BY MOUTH  AT ONSET OF SYMPTOMS AS NEEDED FOR FEVER BLISTERS AND TAKE 2 TABLETS 12 HOURS  LATER. 90 tablet 3   No current facility-administered medications on file prior to visit.    Allergies  Allergen Reactions  . Lisinopril Cough  . Nitrofurantoin Other (See Comments)    Pt unsure  . Oxycodone-Acetaminophen Itching  . Atorvastatin Diarrhea    May have caused colitis--but not clear cut  . Clarithromycin Palpitations  . Glipizide Other (See Comments)    Sugars too variable  . Invokana [Canagliflozin]     Nausea, constipation    Past Medical History:  Diagnosis Date  . Allergy   . Arthritis    hip, back, shoulders, joints  . Asthma   . Complication of anesthesia    problems with block during breast augmentation and  epidural during childbirth  . Depression   . Diabetes mellitus 11/12   type 2  . Family history of adverse reaction to anesthesia    PROBLEMS WITH BLOCKS  . Fatty liver   . GERD (gastroesophageal reflux disease)   . Glaucoma suspect   . Headache    migraines-4x a week headache-1x week migraine  . History of hiatal hernia   . Hyperlipidemia   . Hypertension    Controlled on meds  . Mitral valve prolapse   . 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 Osteopenia   . Pneumonia   . PONV (postoperative nausea and vomiting)   . Shingles 11/2006  . Sleep disturbance   . Tachycardia   . Vertigo    Hx of    Past Surgical History:  Procedure Laterality Date  . ABDOMINAL HYSTERECTOMY  2000   /BSO - abn. [facility]  . APPENDECTOMY  1978  . AUGMENTATION MAMMAPLASTY Bilateral 1981  . BLADDER REPAIR     x2  . BREAST ENHANCEMENT SURGERY  1987  . [place] Left 12/18/2016   Procedure: Left Heart Cath and Coronary Angiography;  Surgeon: Wellington Hampshire, MD;  Location: [facility];  Service: Cardiovascular;  Laterality: Left;  . CARPAL TUNNEL RELEASE Right   . CERVICAL DISCECTOMY  11/2009   with synthetic discs inserted--  Dr Arnoldo Morale  . COLONOSCOPY    . JOINT REPLACEMENT    . NM MYOVIEW LTD  03/2005   EF 56%, no ischemia  . RECTAL PROLAPSE REPAIR     x1  . SHOULDER ARTHROSCOPY WITH OPEN ROTATOR CUFF REPAIR Left 12/29/2019   Procedure: LEFT SHOULDER ARTHROSCOPIC DISTAL CLAVICLE EXCISISON, SUBACROMIAL DECOMPRESSION,BICEPS [place] ;  Surgeon: Lovell Sheehan, MD;  Location: [facility];  Service: Orthopedics;  Laterality: Left;  . SHOULDER CLOSED REDUCTION Left 05/03/2020   Procedure: Left shoulder arthroscopic lysis of adhesions and manipulation under anesthesia;  Surgeon: Lovell Sheehan, MD;  Location: [facility];  Service: Orthopedics;  Laterality: Left;  . SHOULDER SURGERY  09/2008, 01/2009   Right shoulder surgery, then  redone with lysis of adhesions  . TOTAL HIP ARTHROPLASTY Right 05/29/2018   Procedure: TOTAL HIP ARTHROPLASTY ANTERIOR APPROACH;  Surgeon: Lovell Sheehan, MD;  Location: [facility];  Service: Orthopedics;  Laterality: Right;  . UPPER GI ENDOSCOPY      Family History  Problem Relation Age of Onset  . Diabetes Mother   . Heart disease Mother   . Hypertension Mother   . Ovarian cancer Mother   . Melanoma Mother   . Cancer Father   . Coronary artery disease Father   . Heart disease Father   . Diabetes Sister   . Depression  Sister   . Coronary artery disease Sister   . Heart disease Sister   . Rheum arthritis Maternal Aunt   . Breast cancer Maternal Grandmother   . Diabetes Maternal Grandmother   . Heart attack Paternal Uncle   . Heart attack Cousin   . Heart attack Paternal Uncle   . Diabetes Maternal Grandfather   . Colon cancer Neg Hx     Social History   Socioeconomic History  . Marital status: Married    Spouse name: Not on file  . Number of children: 5  . Years of education: Not on file  . Highest education level: Not on file  Occupational History  . Occupation: homemaker  Tobacco Use  . Smoking status: Never Smoker  . Smokeless tobacco: Never Used  Vaping Use  . Vaping Use: Never used  Substance and Sexual Activity  . Alcohol use: No  . Drug use: No  . Sexual activity: Not on file  Other Topics Concern  . Not on file  Social History Narrative   Home maker   Doesn't exercise      [name] sister         Social Determinants of Health   Financial Resource Strain: Not on file  Food Insecurity: Not on file  Transportation Needs: Not on file  Physical Activity: Not on file  Stress: Not on file  Social Connections: Not on file  Intimate Partner Violence: Not on file   Review of Systems Some chest discomfort and tightness Some skipped heartbeats---only occasionally and not new    Objective:   Physical Exam Constitutional:      Appearance:  Normal appearance.  Cardiovascular:     Rate and Rhythm: Normal rate and regular rhythm.     Pulses: Normal pulses.     Heart sounds: No murmur heard. No gallop.   Pulmonary:     Effort: Pulmonary effort is normal.     Breath sounds: Normal breath sounds. No wheezing or rales.  Abdominal:     Palpations: Abdomen is soft.     Tenderness: There is no abdominal tenderness.     Comments: No sig CVA tenderness  Musculoskeletal:     Cervical back: Neck supple.     Right lower leg: No edema.     Left lower leg: No edema.  Lymphadenopathy:     Cervical: No cervical adenopathy.  Neurological:     Mental Status: She is alert.            Assessment & Plan:

## 2021-03-16 NOTE — Assessment & Plan Note (Signed)
Not acute but somewhat suspicious for stone In view of CKD--will check ultrasound

## 2021-03-17 ENCOUNTER — Telehealth: Payer: Self-pay | Admitting: Internal Medicine

## 2021-03-17 ENCOUNTER — Encounter: Payer: Self-pay | Admitting: Internal Medicine

## 2021-03-17 NOTE — Telephone Encounter (Signed)
Patient would like to be called back at [phone] to discuss her sugars. They have been high today and yesterday.

## 2021-03-17 NOTE — Telephone Encounter (Signed)
Was picked up in office 03/17/2021 at 8:49 by Husband Lamonte Richer.

## 2021-03-18 ENCOUNTER — Telehealth: Payer: Self-pay | Admitting: Dietician

## 2021-03-18 NOTE — Telephone Encounter (Signed)
Shannon [name], What are her pump settings now?. I would suggest an increase in basal rate overnight, possibly a 10 to 15% increase.

## 2021-03-18 NOTE — Telephone Encounter (Signed)
Update on current pump settings: Basal settings: 12a-6a 3.8 units/hour increased from 3.5 units/hr today 6a-9p 3 units/hour 9 p-12a 3.5 units/hour Max basal:  5 Max bolus 30 Correction factor 25 IC = 5  Pump settings above per patient's daughter in law reading patient's pump settings from PDM.

## 2021-03-18 NOTE — Telephone Encounter (Signed)
Called patient after discussing with Dr. Cruzita Lederer. Patient is to increase her basal settings from 12a-6a to 3.8 units/hr (increased from 3.5 units/hr). Instructed patient's daughter in law who was able to make this change to patient's omnipod dash pump settings.  Overall control has improved since using the Omnipod pump. Patient is to call for any concerns or questions.  Antonieta Iba, RD, LDN, CDCES

## 2021-03-18 NOTE — Telephone Encounter (Signed)
Patient called stating that she has had higher blood glucose over the past several days and needs guidance.  Dexcom report reviewed.  Glucose mostly high overnight (200-330). POD changed 03/17/2021. Stress chronic. Saw PCP earlier in the week prior to sinus symptoms worsening.  Noted MD questioned IC settings.  Discussed with patient but patient reports no change in her eating habits with good control earlier in the week.  Messaged MD.  Antonieta Iba, RD, LDN, CDCES

## 2021-03-21 ENCOUNTER — Encounter: Payer: Self-pay | Admitting: Internal Medicine

## 2021-03-22 ENCOUNTER — Other Ambulatory Visit: Payer: Self-pay

## 2021-03-22 ENCOUNTER — Ambulatory Visit (INDEPENDENT_AMBULATORY_CARE_PROVIDER_SITE_OTHER): Payer: 59

## 2021-03-22 DIAGNOSIS — R0609 Other forms of dyspnea: Secondary | ICD-10-CM

## 2021-03-22 DIAGNOSIS — R0600 Dyspnea, unspecified: Secondary | ICD-10-CM | POA: Diagnosis not present

## 2021-03-22 NOTE — Telephone Encounter (Signed)
Pt has been in contact with diabetic educator Leonia Reader regarding sugar management.

## 2021-03-23 ENCOUNTER — Telehealth: Payer: Self-pay | Admitting: Internal Medicine

## 2021-03-23 ENCOUNTER — Encounter: Payer: 59 | Attending: Internal Medicine | Admitting: Nutrition

## 2021-03-23 DIAGNOSIS — E1165 Type 2 diabetes mellitus with hyperglycemia: Secondary | ICD-10-CM

## 2021-03-23 LAB — ECHOCARDIOGRAM COMPLETE
AR max vel: 2.45 cm2
AV Area VTI: 1.92 cm2
AV Area mean vel: 2.03 cm2
AV Mean grad: 3 mmHg
AV Peak grad: 6.1 mmHg
Ao pk vel: 1.23 m/s
Area-P 1/2: 4.93 cm2
Calc EF: 52.8 %
S' Lateral: 2.3 cm
Single Plane A2C EF: 54.7 %
Single Plane A4C EF: 50.9 %

## 2021-03-23 NOTE — Telephone Encounter (Signed)
I copied the note from Dr Silvio Pate here in to the [facility] message so we do not have multiple messages on the same thing.

## 2021-03-23 NOTE — Telephone Encounter (Signed)
This doesn't make sense She did have a nutrition visit today---but note not done The triamterene is to keep the potassium from going down with the HCTZ---which we use more for BP than fluid. It doesn't look like she has gained any weight Please try to figure out what this is all about

## 2021-03-23 NOTE — Telephone Encounter (Signed)
I also just sent her echo report and I had discussed furosemide---I asked if that is what they were referring to

## 2021-03-23 NOTE — Patient Instructions (Signed)
Change settings on PDM to U-200 insulin. Then try pod with U200 Lyumjev insulin and apply to arm.  If not red or sore, call on Friday to let us know.  [phone]

## 2021-03-23 NOTE — Telephone Encounter (Signed)
Looks like pt sent a MyChart message, also, about this. It has been forwarded to Dr Silvio Pate.

## 2021-03-23 NOTE — Telephone Encounter (Signed)
Patient just left the endocrinologist and they recommended a change in her meds.   MED: triamterene  Change needed due to fluid buildup.  Please advise. EM

## 2021-03-23 NOTE — Progress Notes (Signed)
Patient says blood sugars running very high in the 300s-400s. Weight is up to 161.4, and having a 1 plus edema of legs. Denies SOB.  Pump download and Dexcom download shows Average blood sugar: 195, with blood sugars rising gradually upward from 180-300 until 5-6AM.  Downloads shown to Dr. Cruzita Lederer and settings were changed per Dr. Chrissie Noa written orders: Basal rate:  Mn: 4.0u/hr, 6AM: 3.0u/hr, 9PM: 4.0u/hr  I/C: MN: 5, 8PM: 4.    Pt. Reports that pod sight is still red and tender for 2-3 days after removing the pod, when used on her abdomen.  When used on her arm, no redness or tenderness.  She asked if she can try the U-200 Lyumjev again on her arm.  Dr. Cruzita Lederer said yes. Written instructions given for setting changes to be made today when she gets home to try the U-200 insulin.  Sheet contains both the U100 pump settings as well as the U-200 pump settings, if she needs to go back to the Novolog (u-100).                       U 100 Novolog      U-200 Lyumjev Basal rate: MN:                       4.0                        2.0 6AM:                     3.0                        1.5 9PM:                     4.0                        2.0  Insulin /Carb  ratio: MN:                         5                          10 8PM:                       4                            8  Correction factor:    25                        70   Daughter reported that she did not need assistance with making these changes.  She was given my number to call if she has questions. Patient told to call office on Friday to let us know if we are needing to continue with prior auth. For Novolog.  She agreed to do this.

## 2021-03-23 NOTE — Telephone Encounter (Signed)
Note from Dr Silvio Pate in previous phone note: She did have a nutrition visit today---but note not done The triamterene is to keep the potassium from going down with the HCTZ---which we use more for BP than fluid. It doesn't look like she has gained any weight.

## 2021-03-24 MED ORDER — FUROSEMIDE 20 MG PO TABS: 20.0000 mg | ORAL_TABLET | Freq: Every day | ORAL | 3 refills | Status: DC

## 2021-03-24 NOTE — Addendum Note (Signed)
Addended by: Viviana Simpler I on: 03/24/2021 01:30 PM   Modules accepted: Orders

## 2021-03-25 NOTE — Telephone Encounter (Signed)
Pt called to report how her lyumjev 200 has been doing per Dr.Gherghe's request. Pt states she started it 2 days ago and when she started her blood sugars were running high but now that it has taken some time to settle, her blood sugars have evened out. Pt reports there isn't as much soreness anymore at pump site.   Pt wanted Dr.Gherghe to know before her meeting today with pt's insurance to determine if she wanted to go through with the Humalog approval.

## 2021-03-29 ENCOUNTER — Telehealth: Payer: Self-pay | Admitting: Nutrition

## 2021-03-29 NOTE — Telephone Encounter (Signed)
Ok, so we need NovoLog - can we submit a PA with this new info? Also, Tileshia, can you please list Humalog as an allergy?

## 2021-03-29 NOTE — Telephone Encounter (Signed)
Pt. Reported that she could not stand the pain at the infusion site from the Lyumjev U200, so she switched back to the Novolog.

## 2021-03-30 ENCOUNTER — Encounter: Payer: Self-pay | Admitting: Internal Medicine

## 2021-03-30 ENCOUNTER — Telehealth: Payer: Self-pay | Admitting: Internal Medicine

## 2021-03-30 NOTE — Telephone Encounter (Signed)
My chart message sent to pt.

## 2021-03-30 NOTE — Telephone Encounter (Signed)
[facility] Nurse Case Manager called to advise they are trying to reach patient - at next visit please let patient know to contact [place] at [phone] ext [phone]

## 2021-03-31 ENCOUNTER — Telehealth: Payer: Self-pay | Admitting: Internal Medicine

## 2021-03-31 NOTE — Telephone Encounter (Signed)
Pt's husband came in the office and picked up Novolog vials and a Dexcom sample box

## 2021-03-31 NOTE — Telephone Encounter (Signed)
Pt's husband came and picked up Novolog and Dexcom sample (hannah Checked out in log book) 03/31/2021 at 11:45am

## 2021-04-01 NOTE — Telephone Encounter (Signed)
PA initiated. Awaiting determination.

## 2021-04-04 ENCOUNTER — Telehealth: Payer: Self-pay | Admitting: Nutrition

## 2021-04-04 NOTE — Telephone Encounter (Signed)
Patient reports that blood sugars have been running "very high, highest was in the 400s.  Now it is 198,   She is on the OmniPod pump.   She is wearing a Dexcom that is linked to our practice.  This was downloaded and put on Dr. Cordelia Pen desk.  Using Novolog insulin.  Basal rate: MN: 4.0, 6AM: 3.0, 9PM: 4.0, I/C:  MN:5, 8PM: 4, ISF: 25

## 2021-04-04 NOTE — Telephone Encounter (Signed)
Please change to these settings: Basal rate: MN: 4.5, 6AM: 2.5, 2PM: 4.0, I/C:  MN:5, 8PM: 4, ISF: 25

## 2021-04-05 NOTE — Telephone Encounter (Signed)
Message sent thru MyChart 

## 2021-04-07 ENCOUNTER — Telehealth: Payer: Self-pay | Admitting: Internal Medicine

## 2021-04-07 ENCOUNTER — Other Ambulatory Visit: Payer: Self-pay

## 2021-04-07 ENCOUNTER — Encounter: Payer: Self-pay | Admitting: Internal Medicine

## 2021-04-07 ENCOUNTER — Other Ambulatory Visit: Payer: Self-pay | Admitting: *Deleted

## 2021-04-07 ENCOUNTER — Ambulatory Visit: Payer: 59 | Admitting: Internal Medicine

## 2021-04-07 ENCOUNTER — Ambulatory Visit
Admission: RE | Admit: 2021-04-07 | Discharge: 2021-04-07 | Disposition: A | Discharge status: Home / Self Care | Payer: 59 | Source: Ambulatory Visit | Attending: Internal Medicine | Admitting: Internal Medicine

## 2021-04-07 VITALS — BP 128/78 | HR 108 | Ht 64.0 in | Wt 166.4 lb

## 2021-04-07 DIAGNOSIS — E1165 Type 2 diabetes mellitus with hyperglycemia: Secondary | ICD-10-CM | POA: Diagnosis not present

## 2021-04-07 DIAGNOSIS — R109 Unspecified abdominal pain: Secondary | ICD-10-CM

## 2021-04-07 DIAGNOSIS — E782 Mixed hyperlipidemia: Secondary | ICD-10-CM

## 2021-04-07 DIAGNOSIS — E1142 Type 2 diabetes mellitus with diabetic polyneuropathy: Secondary | ICD-10-CM

## 2021-04-07 MED ORDER — DAPAGLIFLOZIN PROPANEDIOL 10 MG PO TABS: 10.0000 mg | ORAL_TABLET | Freq: Every day | ORAL | 5 refills | Status: DC

## 2021-04-07 MED ORDER — DEXCOM G6 SENSOR MISC: 1 refills | Status: DC

## 2021-04-07 MED ORDER — OMNIPOD DASH PODS (GEN 4) MISC: 3 refills | Status: DC

## 2021-04-07 MED ORDER — HUMULIN R U-500 (CONCENTRATED) 500 UNIT/ML ~~LOC~~ SOLN: SUBCUTANEOUS | 3 refills | Status: DC

## 2021-04-07 NOTE — Telephone Encounter (Signed)
Mrs. Avans called in and stated that she went to see Dr. Cruzita Lederer and she told her that she needed to see an rheumtologist due to pain and something is running her sugar up, and she wanted her to see a kidney specialist Dr. Juleen China.   Please adivsr

## 2021-04-07 NOTE — Telephone Encounter (Signed)
Spoke to pt. Moved her appt to 5-24 and gave her extra time.

## 2021-04-07 NOTE — Telephone Encounter (Addendum)
Spoke to pt. She said she spoke to Dr Juleen China today and she is scheduled 6-28. Asked if thought we could get her in any sooner. I advised her I did not think so. Her weight is up and her blood sugar is out of control.  She is in a lot of pain. Thinks she needs to be referred to rheumatology. Said Dr Silvio Pate would need to do that referral. She is scheduled to be here on 5-31.

## 2021-04-07 NOTE — Progress Notes (Signed)
Patient ID: Shannon [name], female   DOB: 05/02/1956, 65 y.o.   MRN: [id number]   This visit occurred during the SARS-CoV-2 public health emergency.  Safety protocols were in place, including screening questions prior to the visit, additional usage of staff PPE, and extensive cleaning of exam room while observing appropriate contact time as indicated for disinfecting solutions.   HPI: Shannon [name] is a 65 y.o.-year-old female, initially referred by her PCP, Dr. Silvio Pate, returning for follow-up for DM2, dx in 2012, insulin-dependent since ~2019, uncontrolled, with complications (peripheral neuropathy). Last visit 1.5 months ago.  Interim history: Since last visit, she was able to start on an insulin pump.  We stayed in touch with the patient and she had frequent adjustments of her pump settings and also insulin type since last visit. She was allergic to Humalog/Lyumjev but tolerates NovoLog well.  However, this is not covered by her insurance.  We gave her a coupon card but this did not work for her.  Also, she does not feel that NovoLog is working well for her anymore, states sugars are quite high. We have also been giving her Dexcom CGM samples and these are expensive for her. She has generalized pain, fatigue, leg swelling - better on Furosemide.   Reviewed history: Patient described that her sugars worsened after she had to stop Metformin due to GI symptoms and was changed to sulfonylurea. They increased even further after steroid injection for Achilles tendinitis earlier this month.  Her sugars were initially in the 300-400 range, but then increased to 500-HI (>600) >> she presented to the emergency room (11/12/2020).  Afterwards, she started to work on her diet >> she tells me that she was barely eating anything throughout the day. Sugars improved some, but they were still elevated.  Insulin pump: -OmniPod Dash  Insulin: -She has allergy to Humalog/Lyumjev >> significant irritation and  inflammation at the infusion site] -Currently on NovoLog samples since NovoLog was not covered by her insurance despite 2 preauthorization requests  CGM: -Dexcom G6  Supplies: -Mail-order pharmacy for Dexcom  Reviewed HbA1c levels: Lab Results  Component Value Date   HGBA1C 10.4 (A) 02/15/2021   HGBA1C 9.0 (A) 11/16/2020   HGBA1C 7.8 (A) 07/13/2020   HGBA1C 8.1 (H) 12/22/2019   HGBA1C 7.3 (A) 07/04/2019   HGBA1C 8.9 (H) 01/22/2019   HGBA1C 8.8 (H) 10/15/2018   HGBA1C 7.9 (H) 02/13/2018   HGBA1C 7.0 (H) 09/19/2017   HGBA1C 8.6 (H) 04/13/2017   HGBA1C 8.2 (H) 11/16/2016   HGBA1C 7.4 (H) 05/17/2016   HGBA1C 7.8 (H) 11/19/2015   HGBA1C 7.2 (H) 04/23/2015   HGBA1C 8.4 (H) 01/14/2015   HGBA1C 8.6 (H) 09/22/2014   HGBA1C 8.0 (H) 03/31/2014   HGBA1C 6.9 (H) 09/23/2013   HGBA1C 7.0 (H) 03/17/2013   HGBA1C 6.5 09/16/2012   HGBA1C 6.6 (H) 02/20/2012   HGBA1C 9.0 (H) 10/19/2011   Previously on: - Glimepiride 2 >> 4 mg before breakfast   - Onglyza 5 mg before breakfast - Lantus 30-35 units at bedtime She is intolerant to Metformin >> N/V/D >> had to stop and changed to Glimepiride.  Previously on: - Ozempic 0.5 mg weekly >> stopped 2/2 nausea - Lantus 26 >> 34 >> Toujeo 46 units at bedtime - Lyumjev 6-10 >> 12-18 >> 15 >> 20 units before each meal  Now on: Pump settings: - basal rates: 12 am: 4 units/h 6 AM: 3 units/h 9 PM: 4 units/h - ICR:   12 AM: 1:  5 8 PM: 1: 4 - target: 120-120 - ISF: 1:25 - Insulin on Board: 4 hours - bolus wizard: on TDD from basal insulin: 70.9 units (57%) TDD from bolus insulin: 54.1 units (43%) Total daily dose: 125 to 150 units/day - extended bolusing: not using - changes infusion site: q1-2 days  She checks her sugars more than 4 times a day with her  Dexcom CGM:    Previously:   Previously:   Lowest sugar was 70s >> 120 >> 145 >> 139; she has hypoglycemia awareness at 110.  Highest sugar was HI >> 300s >> 400 >>  300s.  Glucometer: One Touch Verio  Pt's meals are: - Breakfast: usually skips, when eats b'fast: sausage bisquit - Lunch: salad or sandwich - Dinner: meat and 2 veggies - Snacks: 1-2: apples, oranges, occasionally chips  -+ CKD, last BUN/creatinine:  Lab Results  Component Value Date   BUN 17 03/16/2021   BUN 16 03/03/2021   CREATININE 0.90 03/16/2021   CREATININE 1.08 (H) 03/03/2021  She is not on ACE inhibitor/ARB. She had cough with lisinopril..  -+ HL; last set of lipids: Lab Results  Component Value Date   CHOL 180 12/22/2019   HDL 48.70 12/22/2019   LDLCALC 126 (H) 12/14/2016   LDLDIRECT 105.0 12/22/2019   TRIG 335.0 (H) 12/22/2019   CHOLHDL 4 12/22/2019  On Crestor 5 mg 3 times a week.  - last eye exam was in 08/2020: No DR. She has glaucoma - exams q6 mo.  -She has numbness and tingling in her toes. She also has muscle cramps in her feet.  Pt has FH of DM in mother, son, daughter, sister, MGM, MGF, M aunt and uncles.  She also has a history of HTN, fatty liver, GERD.  She presented to the ED for nausea on 12/24/2019.  Per review of the labs, she appeared dehydrated, but no distinct pathology was found.  Of note, lipase was normal.  She started Lopressor for HTN - but had dizziness >> stopped.  TSH was normal: Lab Results  Component Value Date   TSH 0.99 03/16/2021    ROS: Constitutional: + weight gain/no weight loss, + fatigue, no subjective hyperthermia, no subjective hypothermia Eyes: + blurry vision, no xerophthalmia ENT: no sore throat, no nodules palpated in neck, no dysphagia, no odynophagia, no hoarseness Cardiovascular: no CP/no SOB/no palpitations/+ improved leg swelling Respiratory: no cough/no SOB/no wheezing Gastrointestinal: + N/no V/no D/no C/no acid reflux Musculoskeletal: + muscle aches/no joint aches Skin: no rashes, + hair loss Neurological: no tremors/no numbness/no tingling/no dizziness  I reviewed pt's medications, allergies, PMH,  social hx, family hx, and changes were documented in the history of present illness. Otherwise, unchanged from my initial visit note.  Past Medical History:  Diagnosis Date  . Allergy   . Arthritis    hip, back, shoulders, joints  . Asthma   . Complication of anesthesia    problems with block during breast augmentation and epidural during childbirth  . Depression   . Diabetes mellitus 11/12   type 2  . Family history of adverse reaction to anesthesia    PROBLEMS WITH BLOCKS  . Fatty liver   . GERD (gastroesophageal reflux disease)   . Glaucoma suspect   . Headache    migraines-4x a week headache-1x week migraine  . History of hiatal hernia   . Hyperlipidemia   . Hypertension    Controlled on meds  . Mitral valve prolapse   . Nose colonized with MRSA  05/15/2018  . Osteopenia   . Pneumonia   . PONV (postoperative nausea and vomiting)   . Shingles 11/2006  . Sleep disturbance   . Tachycardia   . Vertigo    Hx of   Past Surgical History:  Procedure Laterality Date  . ABDOMINAL HYSTERECTOMY  2000   /BSO - abn. Paps  . APPENDECTOMY  1978  . AUGMENTATION MAMMAPLASTY Bilateral 1981  . BLADDER REPAIR     x2  . BREAST ENHANCEMENT SURGERY  1987  . [place] Left 12/18/2016   Procedure: Left Heart Cath and Coronary Angiography;  Surgeon: Wellington Hampshire, MD;  Location: [facility];  Service: Cardiovascular;  Laterality: Left;  . CARPAL TUNNEL RELEASE Right   . CERVICAL DISCECTOMY  11/2009   with synthetic discs inserted--  Dr Arnoldo Morale  . COLONOSCOPY    . JOINT REPLACEMENT    . NM MYOVIEW LTD  03/2005   EF 56%, no ischemia  . RECTAL PROLAPSE REPAIR     x1  . SHOULDER ARTHROSCOPY WITH OPEN ROTATOR CUFF REPAIR Left 12/29/2019   Procedure: LEFT SHOULDER ARTHROSCOPIC DISTAL CLAVICLE EXCISISON, SUBACROMIAL DECOMPRESSION,BICEPS [place] ;  Surgeon: Lovell Sheehan, MD;  Location: [facility];  Service:  Orthopedics;  Laterality: Left;  . SHOULDER CLOSED REDUCTION Left 05/03/2020   Procedure: Left shoulder arthroscopic lysis of adhesions and manipulation under anesthesia;  Surgeon: Lovell Sheehan, MD;  Location: [facility];  Service: Orthopedics;  Laterality: Left;  . SHOULDER SURGERY  09/2008, 01/2009   Right shoulder surgery, then redone with lysis of adhesions  . TOTAL HIP ARTHROPLASTY Right 05/29/2018   Procedure: TOTAL HIP ARTHROPLASTY ANTERIOR APPROACH;  Surgeon: Lovell Sheehan, MD;  Location: [facility];  Service: Orthopedics;  Laterality: Right;  . UPPER GI ENDOSCOPY     Social History   Socioeconomic History  . Marital status: Married    Spouse name: Not on file  . Number of children: 5  . Years of education: Not on file  . Highest education level: Not on file  Occupational History  . Occupation: homemaker  Tobacco Use  . Smoking status: Never Smoker  . Smokeless tobacco: Never Used  Vaping Use  . Vaping Use: Never used  Substance and Sexual Activity  . Alcohol use: No  . Drug use: No  . Sexual activity: Not on file  Other Topics Concern  . Not on file  Social History Narrative   Home maker   Doesn't exercise      [name] sister         Social Determinants of Health   Financial Resource Strain: Not on file  Food Insecurity: Not on file  Transportation Needs: Not on file  Physical Activity: Not on file  Stress: Not on file  Social Connections: Not on file  Intimate Partner Violence: Not on file   Current Outpatient Medications on File Prior to Visit  Medication Sig Dispense Refill  . aspirin-acetaminophen-caffeine (EXCEDRIN MIGRAINE) 250-250-65 MG tablet Take 1 tablet by mouth every 6 (six) hours as needed for headache.    . Blood Glucose Monitoring Suppl (FIFTY50 GLUCOSE METER 2.0) w/Device KIT OneTouch Ultra2 Meter kit    . cetirizine (ZYRTEC) 10 MG tablet Take 10 mg by mouth daily.     . clobetasol (TEMOVATE) 0.05 % external solution Apply 1  application topically as directed. 1-2 gtts qd/bid aa scalp prn flares, avoid face, groin, axilla 50 mL  0  . Continuous Blood Gluc Receiver ([place]) DEVI by Does not apply route.    . Continuous Blood Gluc Transmit (DEXCOM G6 TRANSMITTER) MISC 1 Device by Does not apply route every 3 (three) months. 1 each 3  . docusate sodium (COLACE) 100 MG capsule Take 300 mg by mouth at bedtime.     . fluticasone (FLONASE) 50 MCG/ACT nasal spray Place 1 spray into both nostrils daily.    . furosemide (LASIX) 20 MG tablet Take 1 tablet (20 mg total) by mouth daily. 30 tablet 3  . Insulin Disposable Pump (OMNIPOD DASH 5 PACK PODS) MISC Use 1 every day 18 each 3  . Insulin Disposable Pump (OMNIPOD DASH SYSTEM) KIT Use as instructed 1 kit 0  . losartan (COZAAR) 25 MG tablet Take 1 tablet (25 mg total) by mouth daily. 90 tablet 3  . LUMIGAN 0.01 % SOLN Place 1 drop into both eyes at bedtime.     . metoprolol succinate (TOPROL-XL) 25 MG 24 hr tablet TAKE 1 AND 1/2 TABLETS BY  MOUTH DAILY 135 tablet 3  . mometasone (ELOCON) 0.1 % cream Apply 1 application topically as directed. Qd to bid aa hands prn flares 45 g 1  . NOVOLOG 100 UNIT/ML injection INJECT 175 UNITS INTO THE SKIN DAILY VIA PUMP 150 mL 1  . ondansetron (ZOFRAN-ODT) 4 MG disintegrating tablet Take 1 tablet (4 mg total) by mouth every 8 (eight) hours as needed. 20 tablet 5  . ONETOUCH DELICA LANCETS FINE MISC Check blood sugar once daily and as needed. 250.00 100 each 1  . ONETOUCH ULTRA test strip USE TO CHECK BLOOD SUGAR 2  TIMES DAILY 200 strip 3  . pantoprazole (PROTONIX) 40 MG tablet TAKE 1 TABLET BY MOUTH  TWICE DAILY ON AN EMPTY  STOMACH 180 tablet 3  . rosuvastatin (CRESTOR) 5 MG tablet TAKE 1 TABLET BY MOUTH 3  TIMES WEEKLY 39 tablet 3  . sucralfate (CARAFATE) 1 g tablet Take 1 tablet (1 g total) by mouth in the morning, at noon, and at bedtime. 270 tablet 3  . traZODone (DESYREL) 100 MG tablet TAKE 2 TABLETS BY MOUTH  EVERY NIGHT AT  BEDTIME 180 tablet 3  . triamterene-hydrochlorothiazide (MAXZIDE-25) 37.5-25 MG tablet TAKE 1 TABLET BY MOUTH ONCE DAILY 90 tablet 3  . valACYclovir (VALTREX) 1000 MG tablet TAKE 2 TABLETS BY MOUTH  AT ONSET OF SYMPTOMS AS NEEDED FOR FEVER BLISTERS AND TAKE 2 TABLETS 12 HOURS LATER. 90 tablet 3   No current facility-administered medications on file prior to visit.   Allergies  Allergen Reactions  . Humalog [Insulin Lispro] Swelling  . Lisinopril Cough  . Nitrofurantoin Other (See Comments)    Pt unsure  . Oxycodone-Acetaminophen Itching  . Atorvastatin Diarrhea    May have caused colitis--but not clear cut  . Clarithromycin Palpitations  . Glipizide Other (See Comments)    Sugars too variable  . Invokana [Canagliflozin]     Nausea, constipation   Family History  Problem Relation Age of Onset  . Diabetes Mother   . Heart disease Mother   . Hypertension Mother   . Ovarian cancer Mother   . Melanoma Mother   . Cancer Father   . Coronary artery disease Father   . Heart disease Father   . Diabetes Sister   . Depression Sister   . Coronary artery disease Sister   . Heart disease Sister   . Rheum arthritis Maternal Aunt   . Breast cancer Maternal  Grandmother   . Diabetes Maternal Grandmother   . Heart attack Paternal Uncle   . Heart attack Cousin   . Heart attack Paternal Uncle   . Diabetes Maternal Grandfather   . Colon cancer Neg Hx     PE: BP 128/78 (BP Location: Right Arm, Patient Position: Sitting, Cuff Size: Normal)   Pulse (!) 108   Ht '5\' 4"'  (1.626 m)   Wt 166 lb 6.4 oz (75.5 kg)   SpO2 99%   BMI 28.56 kg/m  Wt Readings from Last 3 Encounters:  04/07/21 166 lb 6.4 oz (75.5 kg)  03/23/21 161 lb 6.4 oz (73.2 kg)  03/16/21 161 lb (73 kg)   Constitutional: normal weight, in NAD Eyes: PERRLA, EOMI, no exophthalmos ENT: moist mucous membranes, no thyromegaly, no cervical lymphadenopathy Cardiovascular: Tachycardia, RR, No MRG Respiratory: CTA  B Gastrointestinal: abdomen soft, NT, ND, BS+ Musculoskeletal: no deformities, strength intact in all 4 Skin: moist, warm, no rashes Neurological: no tremor with outstretched hands, DTR normal in all 4  ASSESSMENT: 1. DM2, insulin-dependent, uncontrolled, without long-term complications, but with significant hyperglycemia  2. HL  PLAN:  1. Patient with longstanding, uncontrolled, type 2 diabetes, on basal/bolus insulin regimen, with very high insulin resistance and persistently very elevated blood sugars despite switching to an insulin pump after last visit.  She had problems tolerating Humalog and Lyumjev, which are covered by her insurance but unfortunately her insurance does not cover NovoLog which she cannot tolerate.  Also she has problems with using too many pods since she has to change her OmniPod daily rather than every 3 days due to the high insulin requirement so we had to give her samples as she did not get enough pods from the pharmacy. -We tried to use Ozempic but she was constantly nauseated so we had to stop it.  Sugars increased afterwards. -We continued to adjust her pump settings since last visit, with the last message regarding pump settings change 2 days ago (however, she did not see this) CGM interpretation: -At today's visit, we reviewed her CGM downloads: It appears that 17.4% of values are in target range (goal >70%), while 82.6% are higher than 180 (goal <25%), and 0% are lower than 70 (goal <4%).  The calculated average blood sugar is 232.  The projected HbA1c for the next 3 months (GMI) is 8.9%. -Reviewing the CGM trends, sugars remain very high during the night, up to 300s they improved around 7 AM and stayed controlled until 12 PM, when they increase after lunch.  They are not as high after dinner, but they increase significantly after 11 PM. -At this visit discussed about increasing the insulin dose with dinner (will strengthen her insulin to carb ratio with this  meal), and will also increase all of her basal rates, mostly the ones at night. -She would be seeing nephrology and having an ultrasound but her nephrologist suggested adding [place].  We discussed about benefits and possible side effects, and I advised her to stay very well-hydrated, but we can definitely try to use it.  I would have preferred to add this after her diabetes were more controlled, but we can definitely try to it now.  Discussed about the potential for euglycemic DKA. -At this visit, however, it appears that we will need to change to U500 insulin.  She is already requiring a very high dose insulin but at this visit we had to increase the dose since she was end up with approximately 200 units of  insulin daily, meeting the requirements U500 use.  I am hoping that this will be covered by her insurance, if not, will need to apply to patient assistance to [place]. If we do end up switching to U500 insulin, I would suggest the following pump settings: - basal rates: 12 am: 1 units/h 6 AM: 0.7 9 PM: 0.9 - ICR:   12 AM: 1: 25 8 PM: 1: 18 - target: 120-120 - ISF: 1:70 - Insulin on Board: 6 hours -For now, while she is still on NovoLog, I suggested to:  Patient Instructions  Please change to pump settings as follows: - basal rates: 12 am: 4 >> 5 units/h 6 AM: 3 units/h >> 3.5 9 PM: 4 units/h >> 4.5 - ICR:   12 AM: 1: 5 8 PM: 1: 4 >> 3.5 - target: 120-120 - ISF: 1:25 - Insulin on Board: 4 hours  Please add: - Farxiga 10 mg before b'fast  Please return in 1.5 months.  - advised to check sugars at different times of the day - 4x a day, rotating check times - advised for yearly eye exams >> she is UTD - return to clinic in 1.5 months  2. HL -Reviewed latest lipid panel from 12/2019: LDL above goal, triglycerides high: Lab Results  Component Value Date   CHOL 180 12/22/2019   HDL 48.70 12/22/2019   LDLCALC 126 (H) 12/14/2016   LDLDIRECT 105.0 12/22/2019   TRIG 335.0 (H)  12/22/2019   CHOLHDL 4 12/22/2019  -Continues Crestor 5 mg 3 times a week without side effects -She is due for another lipid panel  Total time spent for the visit: 40 min, in reviewing her pump downloads, discussing her  hyper-glycemic episodes, reviewing previous labs and pump settings and developing a plan to avoid hypo- and hyper-glycemia.   Philemon Kingdom, MD PhD [facility] Endocrinology

## 2021-04-07 NOTE — Patient Instructions (Addendum)
Please change to pump settings as follows: - basal rates: 12 am: 4 >> 5 units/h 6 AM: 3 units/h >> 3.5 9 PM: 4 units/h >> 4.5 - ICR:   12 AM: 1: 5 8 PM: 1: 4 >> 3.5 - target: 120-120 - ISF: 1:25 - Insulin on Board: 4 hours  Please add: - Farxiga 10 mg before b'fast  Please return in 1.5 months.

## 2021-04-07 NOTE — Telephone Encounter (Signed)
Dr Juleen China makes sense but I am not sure about the rheumatologist (and they are hard to get in with). You can push up my appt and I can do a thorough joint exam and some blood work to see if there seems to be a rheumatologic problem

## 2021-04-11 ENCOUNTER — Encounter: Payer: Self-pay | Admitting: Internal Medicine

## 2021-04-12 ENCOUNTER — Other Ambulatory Visit: Payer: Self-pay

## 2021-04-12 ENCOUNTER — Ambulatory Visit: Payer: 59 | Admitting: Internal Medicine

## 2021-04-12 ENCOUNTER — Encounter: Payer: Self-pay | Admitting: Internal Medicine

## 2021-04-12 VITALS — BP 130/78 | HR 97 | Temp 97.8°F | Ht 64.0 in | Wt 164.8 lb

## 2021-04-12 DIAGNOSIS — M659 Unspecified synovitis and tenosynovitis, unspecified site: Secondary | ICD-10-CM

## 2021-04-12 LAB — CBC
HCT: 35.8 % — ABNORMAL LOW (ref 36.0–46.0)
Hemoglobin: 11.8 g/dL — ABNORMAL LOW (ref 12.0–15.0)
MCHC: 33 g/dL (ref 30.0–36.0)
MCV: 78.3 fl (ref 78.0–100.0)
Platelets: 328 10*3/uL (ref 150.0–400.0)
RBC: 4.58 Mil/uL (ref 3.87–5.11)
RDW: 14.9 % (ref 11.5–15.5)
WBC: 7.2 10*3/uL (ref 4.0–10.5)

## 2021-04-12 LAB — C-REACTIVE PROTEIN: CRP: <1 mg/dL (ref 0.5–20.0)

## 2021-04-12 LAB — SEDIMENTATION RATE: Sed Rate: 32 mm/hr — ABNORMAL HIGH (ref 0–30)

## 2021-04-12 MED ORDER — CYCLOBENZAPRINE HCL 5 MG PO TABS: 5.0000 mg | ORAL_TABLET | Freq: Every evening | ORAL | 1 refills | Status: DC | PRN

## 2021-04-12 NOTE — Progress Notes (Signed)
Subjective:    Patient ID: Shannon [name], female    DOB: 1956/04/04, 65 y.o.   MRN: [id number]  HPI Here due to worsening joint pains This visit occurred during the SARS-CoV-2 public health emergency.  Safety protocols were in place, including screening questions prior to the visit, additional usage of staff PPE, and extensive cleaning of exam room while observing appropriate contact time as indicated for disinfecting solutions.   Still has some SOB Now more trouble with pain issue Hands are very painful--hard to make fist and even hold things Pain especially bad in left thumb Pain in feet, shoulders, hips and even muscles Some back pain--but not as bad as other places  Has to lie down some of the day Only one to care for mother (since her sister died)  Doesn't tolerate ibuprofen Tried OTC roll on and tylenol--no clear help   Current Outpatient Medications on File Prior to Visit  Medication Sig Dispense Refill  . aspirin-acetaminophen-caffeine (EXCEDRIN MIGRAINE) 250-250-65 MG tablet Take 1 tablet by mouth every 6 (six) hours as needed for headache.    . Blood Glucose Monitoring Suppl (FIFTY50 GLUCOSE METER 2.0) w/Device KIT OneTouch Ultra2 Meter kit    . cetirizine (ZYRTEC) 10 MG tablet Take 10 mg by mouth daily.     . clobetasol (TEMOVATE) 0.05 % external solution Apply 1 application topically as directed. 1-2 gtts qd/bid aa scalp prn flares, avoid face, groin, axilla 50 mL 0  . Continuous Blood Gluc Receiver ([place]) DEVI by Does not apply route.    . Continuous Blood Gluc Sensor (DEXCOM G6 SENSOR) MISC Use one sensor every 10 days 9 each 1  . Continuous Blood Gluc Transmit (DEXCOM G6 TRANSMITTER) MISC 1 Device by Does not apply route every 3 (three) months. 1 each 3  . docusate sodium (COLACE) 100 MG capsule Take 300 mg by mouth at bedtime.     . fluticasone (FLONASE) 50 MCG/ACT nasal spray Place 1 spray into both nostrils daily.    . furosemide (LASIX) 20 MG  tablet Take 1 tablet (20 mg total) by mouth daily. 30 tablet 3  . Insulin Disposable Pump (OMNIPOD DASH PODS, GEN 4,) MISC Use 1 every day 18 each 3  . insulin regular human CONCENTRATED (HUMULIN R) 500 UNIT/ML injection Use up to 200 units of concentrated insulin in the insulin pump daily 20 mL 3  . LUMIGAN 0.01 % SOLN Place 1 drop into both eyes at bedtime.     . metoprolol succinate (TOPROL-XL) 25 MG 24 hr tablet TAKE 1 AND 1/2 TABLETS BY  MOUTH DAILY 135 tablet 3  . mometasone (ELOCON) 0.1 % cream Apply 1 application topically as directed. Qd to bid aa hands prn flares 45 g 1  . ondansetron (ZOFRAN-ODT) 4 MG disintegrating tablet Take 1 tablet (4 mg total) by mouth every 8 (eight) hours as needed. 20 tablet 5  . ONETOUCH DELICA LANCETS FINE MISC Check blood sugar once daily and as needed. 250.00 100 each 1  . ONETOUCH ULTRA test strip USE TO CHECK BLOOD SUGAR 2  TIMES DAILY 200 strip 3  . pantoprazole (PROTONIX) 40 MG tablet TAKE 1 TABLET BY MOUTH  TWICE DAILY ON AN EMPTY  STOMACH 180 tablet 3  . rosuvastatin (CRESTOR) 5 MG tablet TAKE 1 TABLET BY MOUTH 3  TIMES WEEKLY 39 tablet 3  . sucralfate (CARAFATE) 1 g tablet Take 1 tablet (1 g total) by mouth in the morning, at noon, and at bedtime. [place]  tablet 3  . traZODone (DESYREL) 100 MG tablet TAKE 2 TABLETS BY MOUTH  EVERY NIGHT AT BEDTIME 180 tablet 3  . triamterene-hydrochlorothiazide (MAXZIDE-25) 37.5-25 MG tablet TAKE 1 TABLET BY MOUTH ONCE DAILY 90 tablet 3  . valACYclovir (VALTREX) 1000 MG tablet TAKE 2 TABLETS BY MOUTH  AT ONSET OF SYMPTOMS AS NEEDED FOR FEVER BLISTERS AND TAKE 2 TABLETS 12 HOURS LATER. 90 tablet 3  . dapagliflozin propanediol (FARXIGA) 10 MG TABS tablet Take 1 tablet (10 mg total) by mouth daily before breakfast. (Patient not taking: Reported on 04/12/2021) 30 tablet 5  . losartan (COZAAR) 25 MG tablet Take 1 tablet (25 mg total) by mouth daily. (Patient not taking: Reported on 04/12/2021) 90 tablet 3  . NOVOLOG 100 UNIT/ML  injection INJECT 175 UNITS INTO THE SKIN DAILY VIA PUMP 150 mL 1   No current facility-administered medications on file prior to visit.    Allergies  Allergen Reactions  . Humalog [Insulin Lispro] Swelling  . Lisinopril Cough  . Nitrofurantoin Other (See Comments)    Pt unsure  . Oxycodone-Acetaminophen Itching  . Atorvastatin Diarrhea    May have caused colitis--but not clear cut  . Clarithromycin Palpitations  . Glipizide Other (See Comments)    Sugars too variable  . Invokana [Canagliflozin]     Nausea, constipation    Past Medical History:  Diagnosis Date  . Allergy   . Arthritis    hip, back, shoulders, joints  . Asthma   . Complication of anesthesia    problems with block during breast augmentation and epidural during childbirth  . Depression   . Diabetes mellitus 11/12   type 2  . Family history of adverse reaction to anesthesia    PROBLEMS WITH BLOCKS  . Fatty liver   . GERD (gastroesophageal reflux disease)   . Glaucoma suspect   . Headache    migraines-4x a week headache-1x week migraine  . History of hiatal hernia   . Hyperlipidemia   . Hypertension    Controlled on meds  . Mitral valve prolapse   . 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 Osteopenia   . Pneumonia   . PONV (postoperative nausea and vomiting)   . Shingles 11/2006  . Sleep disturbance   . Tachycardia   . Vertigo    Hx of    Past Surgical History:  Procedure Laterality Date  . ABDOMINAL HYSTERECTOMY  2000   /BSO - abn. [facility]  . APPENDECTOMY  1978  . AUGMENTATION MAMMAPLASTY Bilateral 1981  . BLADDER REPAIR     x2  . BREAST ENHANCEMENT SURGERY  1987  . [place] Left 12/18/2016   Procedure: Left Heart Cath and Coronary Angiography;  Surgeon: Wellington Hampshire, MD;  Location: [place] [facility];  Service: Cardiovascular;  Laterality: Left;  . CARPAL TUNNEL RELEASE Right   . CERVICAL DISCECTOMY  11/2009   with synthetic discs  inserted--  Dr Arnoldo Morale  . COLONOSCOPY    . JOINT REPLACEMENT    . NM MYOVIEW LTD  03/2005   EF 56%, no ischemia  . RECTAL PROLAPSE REPAIR     x1  . SHOULDER ARTHROSCOPY WITH OPEN ROTATOR CUFF REPAIR Left 12/29/2019   Procedure: LEFT SHOULDER ARTHROSCOPIC DISTAL CLAVICLE EXCISISON, SUBACROMIAL DECOMPRESSION,BICEPS [place] ;  Surgeon: Lovell Sheehan, MD;  Location: [facility];  Service: Orthopedics;  Laterality: Left;  . SHOULDER CLOSED REDUCTION Left 05/03/2020   Procedure: Left  shoulder arthroscopic lysis of adhesions and manipulation under anesthesia;  Surgeon: Lovell Sheehan, MD;  Location: [facility];  Service: Orthopedics;  Laterality: Left;  . SHOULDER SURGERY  09/2008, 01/2009   Right shoulder surgery, then redone with lysis of adhesions  . TOTAL HIP ARTHROPLASTY Right 05/29/2018   Procedure: TOTAL HIP ARTHROPLASTY ANTERIOR APPROACH;  Surgeon: Lovell Sheehan, MD;  Location: [facility];  Service: Orthopedics;  Laterality: Right;  . UPPER GI ENDOSCOPY      Family History  Problem Relation Age of Onset  . Diabetes Mother   . Heart disease Mother   . Hypertension Mother   . Ovarian cancer Mother   . Melanoma Mother   . Cancer Father   . Coronary artery disease Father   . Heart disease Father   . Diabetes Sister   . Depression Sister   . Coronary artery disease Sister   . Heart disease Sister   . Rheum arthritis Maternal Aunt   . Breast cancer Maternal Grandmother   . Diabetes Maternal Grandmother   . Heart attack Paternal Uncle   . Heart attack Cousin   . Heart attack Paternal Uncle   . Diabetes Maternal Grandfather   . Colon cancer Neg Hx     Social History   Socioeconomic History  . Marital status: Married    Spouse name: Not on file  . Number of children: 5  . Years of education: Not on file  . Highest education level: Not on file  Occupational History  . Occupation: homemaker  Tobacco Use  . Smoking status: Never Smoker  . Smokeless tobacco:  Never Used  Vaping Use  . Vaping Use: Never used  Substance and Sexual Activity  . Alcohol use: No  . Drug use: No  . Sexual activity: Not on file  Other Topics Concern  . Not on file  Social History Narrative   Home maker   Doesn't exercise      [name] sister         Social Determinants of Health   Financial Resource Strain: Not on file  Food Insecurity: Not on file  Transportation Needs: Not on file  Physical Activity: Not on file  Stress: Not on file  Social Connections: Not on file  Intimate Partner Violence: Not on file   Review of Systems No fever Weight is going up No night sweats or chills No trouble swallowing No rash No photosensitiivity Off losartan due to low blood pressure    Objective:   Physical Exam Constitutional:      General: She is not in acute distress. Cardiovascular:     Rate and Rhythm: Normal rate and regular rhythm.     Heart sounds: No murmur heard. No gallop.   Pulmonary:     Effort: Pulmonary effort is normal.     Breath sounds: Normal breath sounds. No wheezing or rales.  Musculoskeletal:     Cervical back: Neck supple.     Comments: Some slight synovitis in right 3rd MCP, 3rd and 4th PIP on both hands Left thumb is tender No other clear synovitis  Lymphadenopathy:     Cervical: No cervical adenopathy.  Neurological:     Mental Status: She is alert.            Assessment & Plan:

## 2021-04-12 NOTE — Assessment & Plan Note (Addendum)
Scattered joint and muscle pain No real systemic signs of rheumatologic disease Will check labs---if all negative will seek physiatry consultation [facility])  Total body pain more consistent with fibromyalgia Will try cyclobenzaprine for bedtime

## 2021-04-12 NOTE — Patient Instructions (Signed)
Please try over the counter diclofenac gel on your painful hand joints (3-4 times a day if it helps).

## 2021-04-13 ENCOUNTER — Other Ambulatory Visit: Payer: Self-pay | Admitting: Internal Medicine

## 2021-04-13 DIAGNOSIS — G894 Chronic pain syndrome: Secondary | ICD-10-CM

## 2021-04-13 LAB — RHEUMATOID FACTOR: Rheumatoid fact SerPl-aCnc: <14 IU/mL (ref <14)

## 2021-04-14 ENCOUNTER — Telehealth: Payer: Self-pay | Admitting: Internal Medicine

## 2021-04-14 NOTE — Telephone Encounter (Signed)
Since she has changed to the U 500 there is a lot of insulin being used and when the pod runs out there is still insulin in there. and patient was wondering what do to about that pt would like a call back

## 2021-04-15 LAB — FANA STAINING PATTERNS: Homogeneous Pattern: 1:80 {titer}

## 2021-04-15 LAB — ANTINUCLEAR ANTIBODIES, IFA: ANA Titer 1: POSITIVE — AB

## 2021-04-15 MED ORDER — DULOXETINE HCL 30 MG PO CPEP: 30.0000 mg | ORAL_CAPSULE | Freq: Every day | ORAL | 3 refills | Status: DC

## 2021-04-15 NOTE — Telephone Encounter (Signed)
Patient called and left a message RE:  Concern that she is wasting a lot of insulin since changing to the U500 insulin.  Will message Vaughan Basta to return call.  Antonieta Iba, RD, LDN, CDCES

## 2021-04-19 ENCOUNTER — Ambulatory Visit: Payer: 59 | Admitting: Internal Medicine

## 2021-04-19 NOTE — Telephone Encounter (Signed)
Shannon [name], Would you mind printing the report?

## 2021-04-19 NOTE — Telephone Encounter (Signed)
Patient said that she is wasting about 80-100u every 3 days.  She was told to fill the cartridge with only 120u every 3 days.  Also reports that she is dropping low every morning between 4:30-6:30 with blood sugars in the low 80s, needing 6-8 glucose tablets before she feels better.  Also says is dropping low in the afternoon at various times.  Please view her Dexcom readings, and let her know if she needs to make changes to the settings.

## 2021-04-19 NOTE — Telephone Encounter (Signed)
Patient was told to change her basal rate from MN to 6AM to 0.90.  She says that her daughter will change this.  She repeated the correct time and basal adjustments.  Said she wrote them down.

## 2021-04-20 ENCOUNTER — Telehealth: Payer: Self-pay | Admitting: Internal Medicine

## 2021-04-20 DIAGNOSIS — M659 Synovitis and tenosynovitis, unspecified: Secondary | ICD-10-CM

## 2021-04-20 NOTE — Telephone Encounter (Signed)
Spoke to pt. Made lab appt for tomorrow. Will forward message to referrals to find out where she is with that.

## 2021-04-20 NOTE — Telephone Encounter (Signed)
Referral did not get sent to [facility] until Friday last week 04/15/21-still in review with their office. I did change it to urgent request.

## 2021-04-20 NOTE — Telephone Encounter (Signed)
Did virtual consult with rheumatologist on [place] MD----agreed that the generalized pain was more consistent with fibromyalgia, but that the hand symptoms warranted a CCP to make sure we weren't missing RA.  Larene Beach, Please set her up for blood draw (not fasting). Also, find out if we are close to getting her physiatry appt set up.

## 2021-04-20 NOTE — Telephone Encounter (Signed)
Okay; thanks.

## 2021-04-21 ENCOUNTER — Other Ambulatory Visit (INDEPENDENT_AMBULATORY_CARE_PROVIDER_SITE_OTHER): Payer: 59

## 2021-04-21 ENCOUNTER — Other Ambulatory Visit: Payer: Self-pay

## 2021-04-21 DIAGNOSIS — G894 Chronic pain syndrome: Secondary | ICD-10-CM

## 2021-04-22 LAB — CYCLIC CITRUL PEPTIDE ANTIBODY, IGG: Cyclic Citrullin Peptide Ab: <16 UNITS

## 2021-05-11 ENCOUNTER — Ambulatory Visit: Payer: 59 | Admitting: Internal Medicine

## 2021-05-12 ENCOUNTER — Ambulatory Visit: Payer: 59 | Admitting: Internal Medicine

## 2021-05-24 ENCOUNTER — Telehealth: Payer: Self-pay | Admitting: Internal Medicine

## 2021-05-24 NOTE — Telephone Encounter (Signed)
Spoke to pt. She was advised she needed a pneumovax 23. She would qualify for Prevnar 20 with Daibetes as the reason. Will forward to Dr Silvio Pate to make sure he saw her [facility] notes and labs and to verify which pneumonia vaccine to give per her labs.

## 2021-05-24 NOTE — Telephone Encounter (Signed)
Since she is planning to check antibody levels, you better check with Dr McDougall's office to be sure which vaccine she wants her to get

## 2021-05-24 NOTE — Telephone Encounter (Signed)
Shannon [name] called in some information was suppose to be faxed over from [facility] Dr. Revonda Humphrey needing the [facility] Booster due to its not showing up in her bloodwork she had done while there. And her IGE and IGG levels are low and wanted to know about what to do. And Dr. Revonda Humphrey wants to recheck a few weeks after getting the booster.

## 2021-05-26 NOTE — Telephone Encounter (Signed)
[facility] called they want patient to get Pneumovax 23.

## 2021-05-26 NOTE — Telephone Encounter (Signed)
Called [facility] and left a message on the nurse line to find out which pneumonia vaccine they would like her to get. We have Prevnar 13, Prevnar 20, and Pneumovax 23.

## 2021-05-27 NOTE — Telephone Encounter (Signed)
Thank you. I have sent her a MyChart message advising her to call and schedule a nurse visit to get the Pneumovax 23.

## 2021-05-31 ENCOUNTER — Ambulatory Visit: Payer: 59

## 2021-06-07 ENCOUNTER — Other Ambulatory Visit: Payer: Self-pay

## 2021-06-07 ENCOUNTER — Ambulatory Visit (INDEPENDENT_AMBULATORY_CARE_PROVIDER_SITE_OTHER): Payer: 59

## 2021-06-07 DIAGNOSIS — Z23 Encounter for immunization: Secondary | ICD-10-CM | POA: Diagnosis not present

## 2021-06-15 ENCOUNTER — Encounter: Payer: 59 | Attending: Internal Medicine | Admitting: Nutrition

## 2021-06-15 ENCOUNTER — Telehealth: Payer: Self-pay | Admitting: Nutrition

## 2021-06-15 ENCOUNTER — Telehealth: Payer: Self-pay | Admitting: Internal Medicine

## 2021-06-15 ENCOUNTER — Other Ambulatory Visit: Payer: Self-pay

## 2021-06-15 DIAGNOSIS — E1165 Type 2 diabetes mellitus with hyperglycemia: Secondary | ICD-10-CM

## 2021-06-15 NOTE — Telephone Encounter (Signed)
Patient called to advise that PDM received - was advised to come to office 06/15/2021 @ 400-415 PM per Leonia Reader

## 2021-06-15 NOTE — Progress Notes (Signed)
Patient reports that blood sugars have been going high for the last 4-5 day. Yesterday it was 400 pcS.  She had done nothing different, and no new medications.  Also new PDM was sent to her today because it was not holding a charge for more than 12 hours. Pump download given to Dr. Kelton Pillar and her new pdm was set with the new pump settings that Dr. Kelton Pillar gave to me: Basal rate-no change in settings:  MN: 0.9, 6AM: 0.7, 9PM: 0.9. I/C:  MN: 25, 4:30 PM: (her supper time): changed from 18 to 16. ISF changed from 70 to 60.  Duration is still 6 hours and target still at 120 with correction over 120.   Changes made to the old pdm as well, as she will use this until her pod expires tomorrow night.   She was told to call if blood sugars remain high.

## 2021-06-15 NOTE — Telephone Encounter (Signed)
Patient received her new pump and will be coming in now for restart and download of current pump blood sugars.

## 2021-06-15 NOTE — Patient Instructions (Signed)
Call if blood sugar readings remain high

## 2021-06-15 NOTE — Telephone Encounter (Signed)
Pt came into office today to speak with diabetic education Christean Grief

## 2021-06-15 NOTE — Telephone Encounter (Signed)
Patient reports that blood sugars have been steadily rising for the last 2 weeks.  FBS today was 256, acL: low to mid 200s after corrections, acS: 300-480 at 4PM.   Not able to get her download, because they have sent her 2 new PDMs in the mail over the last month.  Waiting on another one to come today.   She is not able to download her PDM from home.   Told her I would see her today if PDM comes in.  She will call me when it does and we can set up new pdm, and review blood sugar readings.  If I do not hear from her by 3, I will forward this to the doctor.

## 2021-06-15 NOTE — Telephone Encounter (Signed)
Pt called with concerns of high blood sugars the past couples days. Pt is leaving for vacation soon so she would like this figured out before then. She states she did send a message to [place], but she would also like to see what the Dr says. I did inform pt that Dr.Gherghe is on vacation for a little bit so we will have to route to on call provider.  480 last night 256 fasting this morning   [phone]

## 2021-06-16 ENCOUNTER — Ambulatory Visit: Payer: 59 | Admitting: Internal Medicine

## 2021-06-20 ENCOUNTER — Other Ambulatory Visit: Payer: Self-pay | Admitting: Internal Medicine

## 2021-07-07 ENCOUNTER — Other Ambulatory Visit: Payer: Self-pay | Admitting: Internal Medicine

## 2021-07-20 ENCOUNTER — Encounter: Payer: Self-pay | Admitting: Internal Medicine

## 2021-07-22 ENCOUNTER — Encounter: Payer: Self-pay | Admitting: Internal Medicine

## 2021-07-22 ENCOUNTER — Other Ambulatory Visit: Payer: Self-pay

## 2021-07-22 ENCOUNTER — Ambulatory Visit: Payer: 59 | Admitting: Internal Medicine

## 2021-07-22 VITALS — BP 120/72 | HR 92 | Ht 64.0 in | Wt 163.0 lb

## 2021-07-22 DIAGNOSIS — E1142 Type 2 diabetes mellitus with diabetic polyneuropathy: Secondary | ICD-10-CM | POA: Diagnosis not present

## 2021-07-22 DIAGNOSIS — E1165 Type 2 diabetes mellitus with hyperglycemia: Secondary | ICD-10-CM | POA: Diagnosis not present

## 2021-07-22 DIAGNOSIS — E782 Mixed hyperlipidemia: Secondary | ICD-10-CM

## 2021-07-22 LAB — LIPID PANEL
Cholesterol: 153 mg/dL (ref 0–200)
HDL: 46.1 mg/dL (ref >39.00)
LDL Cholesterol: 72 mg/dL (ref 0–99)
NonHDL: 107.25
Total CHOL/HDL Ratio: 3
Triglycerides: 175 mg/dL — ABNORMAL HIGH (ref 0.0–149.0)
VLDL: 35 mg/dL (ref 0.0–40.0)

## 2021-07-22 LAB — POCT GLYCOSYLATED HEMOGLOBIN (HGB A1C): Hemoglobin A1C: 8 % — AB (ref 4.0–5.6)

## 2021-07-22 MED ORDER — GLUCAGON 3 MG/DOSE NA POWD: 3.0000 mg | Freq: Once | NASAL | 11 refills | Status: DC | PRN

## 2021-07-22 MED ORDER — EMPAGLIFLOZIN 10 MG PO TABS: 10.0000 mg | ORAL_TABLET | Freq: Every day | ORAL | 3 refills | Status: DC

## 2021-07-22 NOTE — Addendum Note (Signed)
Addended by: Lauralyn Primes on: 07/22/2021 03:32 PM   Modules accepted: Orders

## 2021-07-22 NOTE — Progress Notes (Addendum)
Patient ID: Shannon [name], female   DOB: 1956-04-03, 65 y.o.   MRN: [id number]   This visit occurred during the SARS-CoV-2 public health emergency.  Safety protocols were in place, including screening questions prior to the visit, additional usage of staff PPE, and extensive cleaning of exam room while observing appropriate contact time as indicated for disinfecting solutions.   HPI: Shannon [name] is a 65 y.o.-year-old female, initially referred by her PCP, Dr. Silvio Pate, returning for follow-up for DM2, dx in 2012, insulin-dependent since ~2019, uncontrolled, with complications (peripheral neuropathy). Last visit he was continued on 3.5 months ago.  She is here with her daughter.  Interim history: No increased urination, no CP or weight changes but she has occasional nausea, and also has blurry vision. She had laser surgery for glaucoma 05/2021. She again describes fatigue as she is now taking care of her mother who recently fell. Leg swelling resolved after starting Lasix.  Reviewed history: Patient described that her sugars worsened after she had to stop Metformin due to GI symptoms and was changed to sulfonylurea. They increased even further after steroid injection for Achilles tendinitis .  Her sugars were initially in the 300-400 range, but then increased to 500-HI (>600) >> she presented to the emergency room (11/12/2020).  Afterwards, she started to work on her diet >> she tells me that she was barely eating anything throughout the day. Sugars improved some, but they were still elevated.  Insulin pump: -OmniPod Dash  Insulin: -She has allergy to Humalog/Lyumjev >> significant irritation and inflammation at the infusion site -Switched to NovoLog samples since NovoLog was not covered by her insurance despite 2 preauthorization requests  CGM: -Dexcom G6  Supplies: [facility] [employer]  Reviewed HbA1c levels: Lab Results  Component Value Date   HGBA1C 10.4 (A) 02/15/2021    HGBA1C 9.0 (A) 11/16/2020   HGBA1C 7.8 (A) 07/13/2020   HGBA1C 8.1 (H) 12/22/2019   HGBA1C 7.3 (A) 07/04/2019   HGBA1C 8.9 (H) 01/22/2019   HGBA1C 8.8 (H) 10/15/2018   HGBA1C 7.9 (H) 02/13/2018   HGBA1C 7.0 (H) 09/19/2017   HGBA1C 8.6 (H) 04/13/2017   HGBA1C 8.2 (H) 11/16/2016   HGBA1C 7.4 (H) 05/17/2016   HGBA1C 7.8 (H) 11/19/2015   HGBA1C 7.2 (H) 04/23/2015   HGBA1C 8.4 (H) 01/14/2015   HGBA1C 8.6 (H) 09/22/2014   HGBA1C 8.0 (H) 03/31/2014   HGBA1C 6.9 (H) 09/23/2013   HGBA1C 7.0 (H) 03/17/2013   HGBA1C 6.5 09/16/2012   HGBA1C 6.6 (H) 02/20/2012   HGBA1C 9.0 (H) 10/19/2011   Previously on: - Glimepiride 2 >> 4 mg before breakfast   - Onglyza 5 mg before breakfast - Lantus 30-35 units at bedtime She is intolerant to Metformin >> N/V/D >> had to stop and changed to Glimepiride.  Previously on: - Ozempic 0.5 mg weekly >> stopped 2/2 nausea - Lantus 26 >> 34 >> Toujeo 46 units at bedtime - Lyumjev 6-10 >> 12-18 >> 15 >> 20 units before each meal  Now on: - - tried this in 03/2021 >> not covered - Pump settings with U500 - changes since last OV are shown in bold: - basal rates: 12 am: 1 units/h >> 0.9 6 AM: 0.7 9 PM: 0.9 - ICR:   12 AM: 1: 25 8 PM: 1: 18 >> 16 - target: 120-120 - ISF: 1:70 >> 1:60 - Insulin on Board: 6 hours TDD from basal insulin: 70.9 units (57%) >> 64% TDD from bolus insulin: 54.1 units (43%) >>  36% Total daily dose: 150-170 units/day - extended bolusing: not using - changes infusion site: q1-2 days  Coffee + creamer  - enters 40 g carbs  She checks her sugars more than 4 times a day with her  Dexcom CGM:   Previously:   Previously:   Previously:   Lowest sugar was 70s >> 120 >> 145 >> 139 >> 57 x1; she has hypoglycemia awareness at 110.  Highest sugar was HI ...>> 300s >> 300s.  Glucometer: One Touch Verio  Pt's meals are: - Breakfast: usually skips, when eats b'fast: sausage bisquit - Lunch: salad or sandwich - Dinner:  meat and 2 veggies - Snacks: 1-2: apples, oranges, occasionally chips  -+ CKD, last BUN/creatinine:  Lab Results  Component Value Date   BUN 17 03/16/2021   BUN 16 03/03/2021   CREATININE 0.90 03/16/2021   CREATININE 1.08 (H) 03/03/2021  She is not on ACE inhibitor/ARB. She had cough with lisinopril..  -+ HL; last set of lipids: Lab Results  Component Value Date   CHOL 180 12/22/2019   HDL 48.70 12/22/2019   LDLCALC 126 (H) 12/14/2016   LDLDIRECT 105.0 12/22/2019   TRIG 335.0 (H) 12/22/2019   CHOLHDL 4 12/22/2019  On Crestor 5 mg 3 times a week.  - last eye exam was in 01/20/2021: No DR. She has glaucoma - exams q6 mo.  -She has numbness and tingling in her toes. She also has muscle cramps in her feet.  Pt has FH of DM in mother, son, daughter, sister, MGM, MGF, M aunt and uncles.  She also has a history of HTN, fatty liver, GERD. She presented to the ED for nausea on 12/24/2019.  Per review of the labs, she appeared dehydrated, but no distinct pathology was found.  Of note, lipase was normal. She started Lopressor for HTN - but had dizziness >> stopped.  TSH was normal: Lab Results  Component Value Date   TSH 0.99 03/16/2021   ROS: + see HPI  I reviewed pt's medications, allergies, PMH, social hx, family hx, and changes were documented in the history of present illness. Otherwise, unchanged from my initial visit note.  Past Medical History:  Diagnosis Date   Allergy    Arthritis    hip, back, shoulders, joints   Asthma    Complication of anesthesia    problems with block during breast augmentation and epidural during childbirth   Depression    Diabetes mellitus 11/12   type 2   Family history of adverse reaction to anesthesia    PROBLEMS WITH BLOCKS   Fatty liver    GERD (gastroesophageal reflux disease)    Glaucoma suspect    Headache    migraines-4x a week headache-1x week migraine   History of hiatal hernia    Hyperlipidemia    Hypertension     Controlled on meds   Mitral valve prolapse    Nose colonized with MRSA 05/15/2018   Osteopenia    Pneumonia    PONV (postoperative nausea and vomiting)    Shingles 11/2006   Sleep disturbance    Tachycardia    Vertigo    Hx of   Past Surgical History:  Procedure Laterality Date   ABDOMINAL HYSTERECTOMY  2000   /BSO - abn. Paps   APPENDECTOMY  1978   AUGMENTATION MAMMAPLASTY Bilateral 1981   BLADDER REPAIR     x2   BREAST ENHANCEMENT SURGERY  1987   CARDIAC CATHETERIZATION     [facility]  CARDIAC CATHETERIZATION Left 12/18/2016   Procedure: Left Heart Cath and Coronary Angiography;  Surgeon: Wellington Hampshire, MD;  Location: [facility];  Service: Cardiovascular;  Laterality: Left;   CARPAL TUNNEL RELEASE Right    CERVICAL DISCECTOMY  11/2009   with synthetic discs inserted--  Dr Arnoldo Morale   COLONOSCOPY     JOINT REPLACEMENT     NM MYOVIEW LTD  03/2005   EF 56%, no ischemia   RECTAL PROLAPSE REPAIR     x1   SHOULDER ARTHROSCOPY WITH OPEN ROTATOR CUFF REPAIR Left 12/29/2019   Procedure: LEFT SHOULDER ARTHROSCOPIC DISTAL CLAVICLE EXCISISON, SUBACROMIAL DECOMPRESSION,BICEPS [place] ;  Surgeon: Lovell Sheehan, MD;  Location: [facility];  Service: Orthopedics;  Laterality: Left;   SHOULDER CLOSED REDUCTION Left 05/03/2020   Procedure: Left shoulder arthroscopic lysis of adhesions and manipulation under anesthesia;  Surgeon: Lovell Sheehan, MD;  Location: [facility];  Service: Orthopedics;  Laterality: Left;   SHOULDER SURGERY  09/2008, 01/2009   Right shoulder surgery, then redone with lysis of adhesions   TOTAL HIP ARTHROPLASTY Right 05/29/2018   Procedure: TOTAL HIP ARTHROPLASTY ANTERIOR APPROACH;  Surgeon: Lovell Sheehan, MD;  Location: [facility];  Service: Orthopedics;  Laterality: Right;   UPPER GI ENDOSCOPY     Social History   Socioeconomic History   Marital status: Married    Spouse name: Not on file   Number of children: 5   Years of education: Not on  file   Highest education level: Not on file  Occupational History   Occupation: homemaker  Tobacco Use   Smoking status: Never   Smokeless tobacco: Never  Vaping Use   Vaping Use: Never used  Substance and Sexual Activity   Alcohol use: No   Drug use: No   Sexual activity: Not on file  Other Topics Concern   Not on file  Social History Narrative   Home maker   Doesn't exercise      [name] sister         Social Determinants of Health   Financial Resource Strain: Not on file  Food Insecurity: Not on file  Transportation Needs: Not on file  Physical Activity: Not on file  Stress: Not on file  Social Connections: Not on file  Intimate Partner Violence: Not on file   Current Outpatient Medications on File Prior to Visit  Medication Sig Dispense Refill   aspirin-acetaminophen-caffeine (EXCEDRIN MIGRAINE) 250-250-65 MG tablet Take 1 tablet by mouth every 6 (six) hours as needed for headache.     Blood Glucose Monitoring Suppl (FIFTY50 GLUCOSE METER 2.0) w/Device KIT OneTouch Ultra2 Meter kit     cetirizine (ZYRTEC) 10 MG tablet Take 10 mg by mouth daily.      clobetasol (TEMOVATE) 0.05 % external solution Apply 1 application topically as directed. 1-2 gtts qd/bid aa scalp prn flares, avoid face, groin, axilla 50 mL 0   Continuous Blood Gluc Receiver (DEXCOM G6 RECEIVER) DEVI by Does not apply route.     Continuous Blood Gluc Sensor (DEXCOM G6 SENSOR) MISC Use one sensor every 10 days 9 each 1   Continuous Blood Gluc Transmit (DEXCOM G6 TRANSMITTER) MISC 1 Device by Does not apply route every 3 (three) months. 1 each 3   cyclobenzaprine (FLEXERIL) 5 MG tablet Take 1-2 tablets (5-10 mg total) by mouth at bedtime as needed for muscle spasms. 60 tablet 1   dapagliflozin propanediol (FARXIGA) 10 MG TABS tablet Take 1 tablet (10 mg  total) by mouth daily before breakfast. (Patient not taking: Reported on 04/12/2021) 30 tablet 5   docusate sodium (COLACE) 100 MG capsule Take 300  mg by mouth at bedtime.      DULoxetine (CYMBALTA) 30 MG capsule Take 1 capsule (30 mg total) by mouth daily. 30 capsule 3   fluticasone (FLONASE) 50 MCG/ACT nasal spray Place 1 spray into both nostrils daily.     furosemide (LASIX) 20 MG tablet Take 1 tablet by mouth once daily 30 tablet 11   Insulin Disposable Pump (OMNIPOD DASH PODS, GEN 4,) MISC Use 1 every day 18 each 3   insulin regular human CONCENTRATED (HUMULIN R) 500 UNIT/ML injection Use up to 200 units of concentrated insulin in the insulin pump daily 20 mL 3   LUMIGAN 0.01 % SOLN Place 1 drop into both eyes at bedtime.      metoprolol succinate (TOPROL-XL) 25 MG 24 hr tablet TAKE 1 AND 1/2 TABLETS BY  MOUTH DAILY 135 tablet 3   mometasone (ELOCON) 0.1 % cream Apply 1 application topically as directed. Qd to bid aa hands prn flares 45 g 1   ondansetron (ZOFRAN-ODT) 4 MG disintegrating tablet Take 1 tablet (4 mg total) by mouth every 8 (eight) hours as needed. 20 tablet 5   ONETOUCH DELICA LANCETS FINE MISC Check blood sugar once daily and as needed. 250.00 100 each 1   ONETOUCH ULTRA test strip USE TO CHECK BLOOD SUGAR 2  TIMES DAILY 200 strip 3   pantoprazole (PROTONIX) 40 MG tablet TAKE 1 TABLET BY MOUTH  TWICE DAILY ON AN EMPTY  STOMACH 180 tablet 3   rosuvastatin (CRESTOR) 5 MG tablet TAKE 1 TABLET BY MOUTH 3  TIMES WEEKLY 39 tablet 3   sucralfate (CARAFATE) 1 g tablet Take 1 tablet (1 g total) by mouth in the morning, at noon, and at bedtime. 270 tablet 3   traZODone (DESYREL) 100 MG tablet TAKE 2 TABLETS BY MOUTH  EVERY NIGHT AT BEDTIME 180 tablet 3   triamterene-hydrochlorothiazide (MAXZIDE-25) 37.5-25 MG tablet TAKE 1 TABLET BY MOUTH ONCE DAILY 90 tablet 3   valACYclovir (VALTREX) 1000 MG tablet TAKE 2 TABLETS BY MOUTH  AT ONSET OF SYMPTOMS AS NEEDED FOR FEVER BLISTERS AND TAKE 2 TABLETS 12 HOURS LATER. 90 tablet 0   No current facility-administered medications on file prior to visit.   Allergies  Allergen Reactions    Humalog [Insulin Lispro] Swelling   Lisinopril Cough   Nitrofurantoin Other (See Comments)    Pt unsure   Oxycodone-Acetaminophen Itching   Atorvastatin Diarrhea    May have caused colitis--but not clear cut   Clarithromycin Palpitations   Glipizide Other (See Comments)    Sugars too variable   Invokana [Canagliflozin]     Nausea, constipation   Family History  Problem Relation Age of Onset   Diabetes Mother    Heart disease Mother    Hypertension Mother    Ovarian cancer Mother    Melanoma Mother    Cancer Father    Coronary artery disease Father    Heart disease Father    Diabetes Sister    Depression Sister    Coronary artery disease Sister    Heart disease Sister    Rheum arthritis Maternal Aunt    Breast cancer Maternal Grandmother    Diabetes Maternal Grandmother    Heart attack Paternal Uncle    Heart attack Cousin    Heart attack Paternal Uncle    Diabetes Maternal Grandfather  Colon cancer Neg Hx     PE: BP 120/72 (BP Location: Right Arm, Patient Position: Sitting, Cuff Size: Normal)   Pulse 92   Ht '5\' 4"'  (1.626 m)   Wt 163 lb (73.9 kg)   SpO2 95%   BMI 27.98 kg/m  Wt Readings from Last 3 Encounters:  07/22/21 163 lb (73.9 kg)  04/12/21 164 lb 12 oz (74.7 kg)  04/07/21 166 lb 6.4 oz (75.5 kg)   Constitutional: normal weight, in NAD Eyes: PERRLA, EOMI, no exophthalmos ENT: moist mucous membranes, no thyromegaly, no cervical lymphadenopathy Cardiovascular: Tachycardia, RR, No MRG Respiratory: CTA B Gastrointestinal: abdomen soft, NT, ND, BS+ Musculoskeletal: no deformities, strength intact in all 4 Skin: moist, warm, no rashes Neurological: no tremor with outstretched hands, DTR normal in all 4  ASSESSMENT: 1. DM2, insulin-dependent, uncontrolled, without long-term complications, but with significant hyperglycemia  2. HL  PLAN:  1. Patient with longstanding, uncontrolled, type 2 diabetes, on basal-bolus insulin regimen with very high insulin  resistance and persistently high blood sugars despite switching to an insulin pump earlier in the year.  She had problems tolerating her Humalog and Lyumjev which were covered by her insurance and we gave her samples of NovoLog, which she could not tolerate.  Also, she has been on Ozempic, which we had to stop due to constant nausea.  She still has occasional nausea now, but much improved.  However,  her sugars remained high while on NovoLog so at last visit I recommended U500 insulin.  She was able to start this and sugars started to improve.  -At last visit, since her nephrologist suggested Farxiga, we tried to add this, but this was apparently not covered by her insurance.  We did discuss at that time about the potential for euglycemic DKA. CGM interpretation: -At today's visit, we reviewed her CGM downloads: It appears that 44.2% of values are in target range (goal >70%), while 55.7% are higher than 180 (goal <25%), and 0.1% are lower than 70 (goal <4%).  The calculated average blood sugar is 192.  The projected HbA1c for the next 3 months (GMI) is 7.9%. -Reviewing the CGM trends, her sugars improved significantly since last visit, with more values in range, however, she has been more stressed lately taking care of her mother and the sugars appear to be higher.  They are almost constantly higher during the night and upon review of the pump settings, she has a lower setting from 12 AM to 6 AM the recommended at last visit so we willincrease this back to 1.0 units an hour.  I will also increase her 9 PM to 12 AM basal rate, since her sugars are usually above target at that time.  Sugars also improve after each meal and more so after lunch and dinner despite entering carbs and bolusing for them.  That is why I suggested to strengthen her ICR's with her meals.  She is not eating breakfast, only drinks coffee and enters 40 g of carbs in the pump for the coffee.  Since sugars increase after this, she would benefit  from a higher insulin to carb ratio with coffee, also.  Since we are also adding Jardiance, I advised her to start with changing the basal rate and adding Jardiance and if the sugars are still high after 3 to 4 days, to make the ICR changes. -She did have a low blood sugar in the middle of the night, at 58, but this only happened once and may have  been when she skips dinner.  For now, I would just watch for more hypoglycemic episodes, but not change the regimen.  However, we did discuss about correction of hypoglycemia.  Upon questioning, they do not have a glucagon kit at home.  I discussed with her and her daughter why we would use this and how to use it.  I sent a prescription for Baqsimi to her pharmacy but I advised her to let me know if this is not covered so I can call in [place]. -I suggested: Patient Instructions  Please use the following pump settings - with U500: - basal rates: 12 am: 0.9 >> 1.0 6 AM: 0.7  9 PM: 0.9 >> 1.0 - ICR:   12 AM: 1: 25 >> 1:22 8 PM: 1: 16 >> 1:13 -- target: 120-120  - ISF: 1:60 - Insulin on Board: 6 hours  Please start Jardiance 10 mg before b'fast.  Please return in 3 months.  - we checked her HbA1c: 8% (much better) - advised to check sugars at different times of the day - 4x a day, rotating check times - advised for yearly eye exams >> she is UTD - return to clinic in 3 months  2. HL -Reviewed latest lipid panel from 12/2019: LDL above goal: Triglycerides also high:: Lab Results  Component Value Date   CHOL 180 12/22/2019   HDL 48.70 12/22/2019   LDLCALC 126 (H) 12/14/2016   LDLDIRECT 105.0 12/22/2019   TRIG 335.0 (H) 12/22/2019   CHOLHDL 4 12/22/2019  -She continues Crestor 5 mg 3 times a week-no side effects -She is due for another lipid panel -she had coffee with creamer this morning  Total time spent for the visit: 40 min, in reviewing her pump downloads, discussing her hypo- and hyper-glycemic episodes, reviewing previous labs and pump  settings and developing a plan to avoid and correct hypo- and hyper-glycemia.   Component     Latest Ref Rng & Units 07/22/2021  Cholesterol     0 - 200 mg/dL 153  Triglycerides     0.0 - 149.0 mg/dL 175.0 (H)  HDL Cholesterol     >39.00 mg/dL 46.10  VLDL     0.0 - 40.0 mg/dL 35.0  LDL (calc)     0 - 99 mg/dL 72  Total CHOL/HDL Ratio      3  NonHDL      107.25  Much improved LDL and triglycerides!  Philemon Kingdom, MD PhD [facility] Endocrinology

## 2021-07-22 NOTE — Patient Instructions (Addendum)
Please use the following pump settings - with U500: - basal rates: 12 am: 0.9 >> 1.0 6 AM: 0.7  9 PM: 0.9 >> 1.0 - ICR:   12 AM: 1: 25 >> 1:22 8 PM: 1: 16 >> 1:13 -- target: 120-120  - ISF: 1:60 - Insulin on Board: 6 hours  Please start Jardiance 10 mg before b'fast.  Please return in 3 months.

## 2021-07-26 ENCOUNTER — Encounter: Payer: Self-pay | Admitting: Internal Medicine

## 2021-08-02 ENCOUNTER — Other Ambulatory Visit: Payer: Self-pay

## 2021-08-02 ENCOUNTER — Ambulatory Visit: Payer: 59 | Admitting: Dermatology

## 2021-08-02 DIAGNOSIS — B079 Viral wart, unspecified: Secondary | ICD-10-CM

## 2021-08-02 DIAGNOSIS — L720 Epidermal cyst: Secondary | ICD-10-CM | POA: Diagnosis not present

## 2021-08-02 DIAGNOSIS — L820 Inflamed seborrheic keratosis: Secondary | ICD-10-CM | POA: Diagnosis not present

## 2021-08-02 DIAGNOSIS — L578 Other skin changes due to chronic exposure to nonionizing radiation: Secondary | ICD-10-CM | POA: Diagnosis not present

## 2021-08-02 NOTE — Patient Instructions (Addendum)
Recommend OTC [place] Rapid Relief Anti-Itch cream (pramoxine + menthol) up to 3 times per day to areas that are itchy.  Cryotherapy Aftercare  Wash gently with soap and water everyday.   Apply Vaseline and Band-Aid daily until healed.    Seborrheic Keratosis  What causes seborrheic keratoses? Seborrheic keratoses are harmless, common skin growths that first appear during adult life.  As time goes by, more growths appear.  Some people may develop a large number of them.  Seborrheic keratoses appear on both covered and uncovered body parts.  They are not caused by sunlight.  The tendency to develop seborrheic keratoses can be inherited.  They vary in color from skin-colored to gray, brown, or even black.  They can be either smooth or have a rough, warty surface.   Seborrheic keratoses are superficial and look as if they were stuck on the skin.  Under the microscope this type of keratosis looks like layers upon layers of skin.  That is why at times the top layer may seem to fall off, but the rest of the growth remains and re-grows.    Treatment Seborrheic keratoses do not need to be treated, but can easily be removed in the office.  Seborrheic keratoses often cause symptoms when they rub on clothing or jewelry.  Lesions can be in the way of shaving.  If they become inflamed, they can cause itching, soreness, or burning.  Removal of a seborrheic keratosis can be accomplished by freezing, burning, or surgery. If any spot bleeds, scabs, or grows rapidly, please return to have it checked, as these can be an indication of a skin cancer.   Viral Warts & Molluscum Contagiosum  Viral warts and molluscum contagiosum are growths of the skin caused by viral infection of the skin. If you have been given the diagnosis of viral warts or molluscum contagiosum there are a few things that you must understand about your condition:  There is no guaranteed treatment method available for this condition. Multiple  treatments may be required, The treatments may be time consuming and require multiple visits to the dermatology office. The treatment may be expensive. You will be charged each time you come into the office to have the spots treated. The treated areas may develop new lesions further complicating treatment. The treated areas may leave a scar. There is no guarantee that even after multiple treatments that the spots will be successfully treated. These are caused by a viral infection and can be spread to other areas of the skin and to other people by direct contact. Therefore, new spots may occur.   If you have any questions or concerns for your doctor, please call our main line at [phone] and press option 4 to reach your doctor's medical assistant. If no one answers, please leave a voicemail as directed and we will return your call as soon as possible. Messages left after 4 pm will be answered the following business day.   You may also send [place] a message via [place]. We typically respond to MyChart messages within 1-2 business days.  For prescription refills, please ask your pharmacy to contact our office. Our fax number is [phone].  If you have an urgent issue when the clinic is closed that cannot wait until the next business day, you can page your doctor at the number below.    Please note that while we do our best to be available for urgent issues outside of office hours, we are not available 24/7.  If you have an urgent issue and are unable to reach [place], you may choose to seek medical care at your doctor's office, retail clinic, urgent care center, or emergency room.  If you have a medical emergency, please immediately call 911 or go to the emergency department.  Pager Numbers  - Dr. Nehemiah Massed: [phone]  - Dr. Laurence Ferrari: [phone]  - Dr. Nicole Kindred: [phone]  In the event of inclement weather, please call our main line at [phone] for an update on the status of any  delays or closures.  Dermatology Medication Tips: Please keep the boxes that topical medications come in in order to help keep track of the instructions about where and how to use these. Pharmacies typically print the medication instructions only on the boxes and not directly on the medication tubes.   If your medication is too expensive, please contact our office at [phone] option 4 or send [place] a message through [place].   We are unable to tell what your co-pay for medications will be in advance as this is different depending on your insurance coverage. However, we may be able to find a substitute medication at lower cost or fill out paperwork to get insurance to cover a needed medication.   If a prior authorization is required to get your medication covered by your insurance company, please allow [place] 1-2 business days to complete this process.  Drug prices often vary depending on where the prescription is filled and some pharmacies may offer cheaper prices.  The website www.goodrx.com contains coupons for medications through different pharmacies. The prices here do not account for what the cost may be with help from insurance (it may be cheaper with your insurance), but the website can give you the price if you did not use any insurance.  - You can print the associated coupon and take it with your prescription to the pharmacy.  - You may also stop by our office during regular business hours and pick up a GoodRx coupon card.  - If you need your prescription sent electronically to a different pharmacy, notify our office through [facility] or by phone at [phone] option 4.

## 2021-08-02 NOTE — Progress Notes (Signed)
Follow-Up Visit   Subjective  Shannon [name] is a 65 y.o. female who presents for the following: Warts (Right 2nd finger, needs treatment) and Itchy spots (Legs and back, getting bigger on back). Also arms.   The following portions of the chart were reviewed this encounter and updated as appropriate:       Review of Systems:  No other skin or systemic complaints except as noted in HPI or Assessment and Plan.  Objective  Well appearing patient in no apparent distress; mood and affect are within normal limits.  A focused examination was performed including face, fingers, back, legs. Relevant physical exam findings are noted in the Assessment and Plan.  L upper arm x 2, R med thigh x 2, R lower hip x 1, mid back x 7 (12) Erythematous keratotic or waxy stuck-on papule    Right index PIP 3.0 mm Verrucous papule -- Discussed viral etiology and contagion.   Right Lower Back Subcutaneous nodule.    Assessment & Plan  Inflamed seborrheic keratosis L upper arm x 2, R med thigh x 2, R lower hip x 1, mid back x 7  Start AmLactin Cream - apply to legs/ankles twice a day to soften areas, info given.  Recommend OTC Gold Bond Rapid Relief Anti-Itch cream (pramoxine + menthol) up to 3 times per day to areas that are itchy. Or CeraVe anti-itch lotion   Destruction of lesion - L upper arm x 2, R med thigh x 2, R lower hip x 1, mid back x 7  Destruction method: cryotherapy   Informed consent: discussed and consent obtained   Lesion destroyed using liquid nitrogen: Yes   Region frozen until ice ball extended beyond lesion: Yes   Outcome: patient tolerated procedure well with no complications   Post-procedure details: wound care instructions given   Additional details:  Prior to procedure, discussed risks of blister formation, small wound, skin dyspigmentation, or rare scar following cryotherapy. Recommend Vaseline ointment to treated areas while healing.   Viral warts, unspecified  type Right index PIP  Discussed viral etiology and risk of spread.  Discussed multiple treatments may be required to clear warts.  Discussed possible post-treatment dyspigmentation and risk of recurrence.  Destruction of lesion - Right index PIP  Destruction method: cryotherapy   Informed consent: discussed and consent obtained   Lesion destroyed using liquid nitrogen: Yes   Region frozen until ice ball extended beyond lesion: Yes   Outcome: patient tolerated procedure well with no complications   Post-procedure details: wound care instructions given   Additional details:  Prior to procedure, discussed risks of blister formation, small wound, skin dyspigmentation, or rare scar following cryotherapy. Recommend Vaseline ointment to treated areas while healing.   Epidermal inclusion cyst Right Lower Back  Benign-appearing. Exam most consistent with an epidermal inclusion cyst. Discussed that a cyst is a benign growth that can grow over time and sometimes get irritated or inflamed. Recommend observation if it is not bothersome. Discussed option of surgical excision to remove it if it is growing, symptomatic, or other changes noted. Please call for new or changing lesions so they can be evaluated.    Actinic Damage - chronic, secondary to cumulative UV radiation exposure/sun exposure over time - diffuse scaly erythematous macules with underlying dyspigmentation - Recommend daily broad spectrum sunscreen SPF 30+ to sun-exposed areas, reapply every 2 hours as needed.  - Recommend staying in the shade or wearing long sleeves, sun glasses (UVA+UVB protection) and wide brim  hats (4-inch brim around the entire circumference of the hat). - Call for new or changing lesions.   Return in about 2 months (around 10/02/2021) for f/u ISKs/wart and check spot on upper abdomen.  IJamesetta Orleans, CMA, am acting as scribe for Brendolyn Patty, MD .  Documentation: I have reviewed the above documentation for  accuracy and completeness, and I agree with the above.  Brendolyn Patty MD

## 2021-08-09 ENCOUNTER — Encounter: Payer: Self-pay | Admitting: Internal Medicine

## 2021-08-15 ENCOUNTER — Other Ambulatory Visit: Payer: Self-pay | Admitting: Internal Medicine

## 2021-08-16 ENCOUNTER — Ambulatory Visit: Payer: 59 | Admitting: Dermatology

## 2021-09-14 ENCOUNTER — Emergency Department: Admission: EM | Admit: 2021-09-14 | Discharge: 2021-09-14 | Discharge status: Against Medical Advice | Payer: 59

## 2021-09-15 ENCOUNTER — Other Ambulatory Visit: Payer: Self-pay

## 2021-09-15 ENCOUNTER — Encounter: Payer: Self-pay | Admitting: Intensive Care

## 2021-09-15 ENCOUNTER — Emergency Department: Payer: 59

## 2021-09-15 DIAGNOSIS — Z7984 Long term (current) use of oral hypoglycemic drugs: Secondary | ICD-10-CM | POA: Insufficient documentation

## 2021-09-15 DIAGNOSIS — R1030 Lower abdominal pain, unspecified: Secondary | ICD-10-CM | POA: Diagnosis present

## 2021-09-15 DIAGNOSIS — J45909 Unspecified asthma, uncomplicated: Secondary | ICD-10-CM | POA: Insufficient documentation

## 2021-09-15 DIAGNOSIS — Z96641 Presence of right artificial hip joint: Secondary | ICD-10-CM | POA: Insufficient documentation

## 2021-09-15 DIAGNOSIS — N1831 Chronic kidney disease, stage 3a: Secondary | ICD-10-CM | POA: Diagnosis not present

## 2021-09-15 DIAGNOSIS — Z79899 Other long term (current) drug therapy: Secondary | ICD-10-CM | POA: Diagnosis not present

## 2021-09-15 DIAGNOSIS — I129 Hypertensive chronic kidney disease with stage 1 through stage 4 chronic kidney disease, or unspecified chronic kidney disease: Secondary | ICD-10-CM | POA: Diagnosis not present

## 2021-09-15 DIAGNOSIS — Z794 Long term (current) use of insulin: Secondary | ICD-10-CM | POA: Diagnosis not present

## 2021-09-15 DIAGNOSIS — R509 Fever, unspecified: Secondary | ICD-10-CM | POA: Insufficient documentation

## 2021-09-15 DIAGNOSIS — K219 Gastro-esophageal reflux disease without esophagitis: Secondary | ICD-10-CM | POA: Insufficient documentation

## 2021-09-15 DIAGNOSIS — E1149 Type 2 diabetes mellitus with other diabetic neurological complication: Secondary | ICD-10-CM | POA: Insufficient documentation

## 2021-09-15 LAB — CBC WITH DIFFERENTIAL/PLATELET
Abs Immature Granulocytes: 0.05 10*3/uL (ref 0.00–0.07)
Basophils Absolute: 0 10*3/uL (ref 0.0–0.1)
Basophils Relative: 0 %
Eosinophils Absolute: 0.2 10*3/uL (ref 0.0–0.5)
Eosinophils Relative: 2 %
HCT: 36.6 % (ref 36.0–46.0)
Hemoglobin: 11.7 g/dL — ABNORMAL LOW (ref 12.0–15.0)
Immature Granulocytes: 0 %
Lymphocytes Relative: 20 %
Lymphs Abs: 2.5 10*3/uL (ref 0.7–4.0)
MCH: 23.8 pg — ABNORMAL LOW (ref 26.0–34.0)
MCHC: 32 g/dL (ref 30.0–36.0)
MCV: 74.4 fL — ABNORMAL LOW (ref 80.0–100.0)
Monocytes Absolute: 1.1 10*3/uL — ABNORMAL HIGH (ref 0.1–1.0)
Monocytes Relative: 9 %
Neutro Abs: 8.5 10*3/uL — ABNORMAL HIGH (ref 1.7–7.7)
Neutrophils Relative %: 69 %
Platelets: 408 10*3/uL — ABNORMAL HIGH (ref 150–400)
RBC: 4.92 MIL/uL (ref 3.87–5.11)
RDW: 15.7 % — ABNORMAL HIGH (ref 11.5–15.5)
WBC: 12.3 10*3/uL — ABNORMAL HIGH (ref 4.0–10.5)
nRBC: 0 % (ref 0.0–0.2)

## 2021-09-15 LAB — URINALYSIS, ROUTINE W REFLEX MICROSCOPIC
Bacteria, UA: NONE SEEN
Bilirubin Urine: NEGATIVE
Glucose, UA: >=500 mg/dL — AB
Hgb urine dipstick: NEGATIVE
Ketones, ur: NEGATIVE mg/dL
Leukocytes,Ua: NEGATIVE
Nitrite: NEGATIVE
Protein, ur: NEGATIVE mg/dL
Specific Gravity, Urine: 1.012 (ref 1.005–1.030)
Squamous Epithelial / HPF: NONE SEEN (ref 0–5)
pH: 7 (ref 5.0–8.0)

## 2021-09-15 LAB — BASIC METABOLIC PANEL
Anion gap: 11 (ref 5–15)
BUN: 14 mg/dL (ref 8–23)
CO2: 27 mmol/L (ref 22–32)
Calcium: 9.6 mg/dL (ref 8.9–10.3)
Chloride: 101 mmol/L (ref 98–111)
Creatinine, Ser: 0.93 mg/dL (ref 0.44–1.00)
GFR, Estimated: >60 mL/min (ref >60)
Glucose, Bld: 251 mg/dL — ABNORMAL HIGH (ref 70–99)
Potassium: 3.6 mmol/L (ref 3.5–5.1)
Sodium: 139 mmol/L (ref 135–145)

## 2021-09-15 MED ORDER — OXYCODONE-ACETAMINOPHEN 5-325 MG PO TABS
1.0000 | ORAL_TABLET | Freq: Once | ORAL | Status: AC
  Administered 2021-09-15: 1 via ORAL
  Filled 2021-09-15: qty 1

## 2021-09-15 MED ORDER — ONDANSETRON 4 MG PO TBDP
4.0000 mg | ORAL_TABLET | Freq: Once | ORAL | Status: AC
  Administered 2021-09-15: 4 mg via ORAL
  Filled 2021-09-15: qty 1

## 2021-09-15 MED ORDER — DIPHENHYDRAMINE HCL 25 MG PO CAPS
25.0000 mg | ORAL_CAPSULE | Freq: Once | ORAL | Status: AC
  Administered 2021-09-15: 25 mg via ORAL
  Filled 2021-09-15: qty 1

## 2021-09-15 NOTE — ED Provider Notes (Signed)
Emergency Medicine Provider Triage Evaluation Note  RIDLEY DILEO, a 65 y.o. female  was evaluated in triage.  Pt complains of presents to the ED with complaints of left lower quadrant pain for the last night.  She describes onset about 5 days ago with worsening symptoms yesterday.  She presented to the ED but left secondary to the protracted wait.  She returned to the ED today via EMS for evaluation of severe left lower quadrant abdominal pain.  She denies nausea, vomiting, or dysuria.  Review of Systems  Positive: LLQ abdominal pain as above Negative: NV, dysuria  Physical Exam  BP 125/86 (BP Location: Right Arm)   Pulse (!) 107   Temp 98.9 F (37.2 C) (Oral)   Resp (!) 22   Ht 5\' 4"  (1.626 m)   Wt 72.1 kg   SpO2 98%   BMI 27.29 kg/m  Gen:   Awake, no distress, uncomfortable Resp:  Normal effort CTA MSK:   Moves extremities without difficulty Other:  ABD: LLQ pain  Medical Decision Making  Medically screening exam initiated at 6:05 PM.  Appropriate orders placed.  ROSELIE CIRIGLIANO was informed that the remainder of the evaluation will be completed by another provider, this initial triage assessment does not replace that evaluation, and the importance of remaining in the ED until their evaluation is complete.  Patient ED evaluation of left lower quad abdominal pain for the last 5 days with significantly worsening yesterday.   Melvenia Needles, PA-C 09/15/21 1807    Vanessa Dickinson, MD 09/15/21 [phone]

## 2021-09-15 NOTE — ED Triage Notes (Addendum)
Pt in via EMS from home with c/o severe abd pain for 5 days worsening today. Pt was seen at [facility], was evaluated and was sent to the ED but the wait was too long so she left. Pt also with NV. FSBS 241, has insulin pump, 115 HR, 99% RA, 134/70

## 2021-09-15 NOTE — ED Triage Notes (Signed)
Patient c/o left flank pain with bloating, cramping that feels like labor pains, and pressure when trying to urinate. Patient appears uncomfortable. HX colitis. Reports fever

## 2021-09-16 ENCOUNTER — Telehealth: Payer: Self-pay

## 2021-09-16 ENCOUNTER — Ambulatory Visit (INDEPENDENT_AMBULATORY_CARE_PROVIDER_SITE_OTHER): Payer: 59 | Admitting: Internal Medicine

## 2021-09-16 ENCOUNTER — Other Ambulatory Visit: Payer: Self-pay

## 2021-09-16 ENCOUNTER — Encounter: Payer: Self-pay | Admitting: Internal Medicine

## 2021-09-16 ENCOUNTER — Other Ambulatory Visit: Payer: Self-pay | Admitting: Internal Medicine

## 2021-09-16 ENCOUNTER — Emergency Department
Admission: EM | Admit: 2021-09-16 | Discharge: 2021-09-16 | Disposition: A | Discharge status: Home / Self Care | Payer: 59 | Attending: Emergency Medicine | Admitting: Emergency Medicine

## 2021-09-16 DIAGNOSIS — R1032 Left lower quadrant pain: Secondary | ICD-10-CM | POA: Insufficient documentation

## 2021-09-16 DIAGNOSIS — R109 Unspecified abdominal pain: Secondary | ICD-10-CM

## 2021-09-16 DIAGNOSIS — L4050 Arthropathic psoriasis, unspecified: Secondary | ICD-10-CM | POA: Insufficient documentation

## 2021-09-16 LAB — HEPATIC FUNCTION PANEL
ALT: 28 U/L (ref 0–44)
AST: 31 U/L (ref 15–41)
Albumin: 3.7 g/dL (ref 3.5–5.0)
Alkaline Phosphatase: 102 U/L (ref 38–126)
Bilirubin, Direct: <0.1 mg/dL (ref 0.0–0.2)
Total Bilirubin: 0.6 mg/dL (ref 0.3–1.2)
Total Protein: 7.4 g/dL (ref 6.5–8.1)

## 2021-09-16 LAB — URINE CULTURE: Culture: NO GROWTH

## 2021-09-16 LAB — LIPASE, BLOOD: Lipase: 44 U/L (ref 11–51)

## 2021-09-16 MED ORDER — DIPHENHYDRAMINE HCL 25 MG PO CAPS
ORAL_CAPSULE | ORAL | Status: AC
  Administered 2021-09-16: 25 mg via ORAL
  Filled 2021-09-16: qty 1

## 2021-09-16 MED ORDER — OXYCODONE-ACETAMINOPHEN 5-325 MG PO TABS
1.0000 | ORAL_TABLET | Freq: Once | ORAL | Status: AC
  Administered 2021-09-16: 1 via ORAL
  Filled 2021-09-16: qty 1

## 2021-09-16 MED ORDER — DIPHENHYDRAMINE HCL 25 MG PO CAPS: 25.0000 mg | ORAL_CAPSULE | Freq: Once | ORAL | Status: AC

## 2021-09-16 MED ORDER — ONDANSETRON 4 MG PO TBDP: 4.0000 mg | ORAL_TABLET | Freq: Four times a day (QID) | ORAL | 0 refills | Status: DC | PRN

## 2021-09-16 MED ORDER — OXYCODONE-ACETAMINOPHEN 5-325 MG PO TABS: 1.0000 | ORAL_TABLET | ORAL | 0 refills | Status: DC | PRN

## 2021-09-16 NOTE — Assessment & Plan Note (Signed)
Given her ongoing pain, I recommended that she try a trial of the immunosuppressive Rx

## 2021-09-16 NOTE — ED Provider Notes (Signed)
[facility] Emergency Department Provider Note  ____________________________________________   Event Date/Time   First MD Initiated Contact with Patient 09/16/21 [phone]     (approximate)  I have reviewed the triage vital signs and the nursing notes.   HISTORY  Chief Complaint Flank Pain    HPI [name] is a 65 y.o. female with history of hypertension, diabetes, hyperlipidemia, fatty liver, GERD who presents to the emergency department with complaints of 4 to 5 days of sharp, severe left-sided abdominal pain mostly in the lower abdomen.  She reports she has had subjective fevers.  States her highest temperature has been 99.8.  Denies any nausea or vomiting.  No diarrhea.  States she has had less bowel movements than normal but does not feel constipated.  No dysuria, hematuria, vaginal bleeding or discharge.  She has had previous total hysterectomy, lysis of adhesions, appendectomy, previous bladder surgery as well as rectal surgery.        Past Medical History:  Diagnosis Date   Allergy    Arthritis    hip, back, shoulders, joints   Asthma    Complication of anesthesia    problems with block during breast augmentation and epidural during childbirth   Depression    Diabetes mellitus 11/12   type 2   Family history of adverse reaction to anesthesia    PROBLEMS WITH BLOCKS   Fatty liver    GERD (gastroesophageal reflux disease)    Glaucoma suspect    Headache    migraines-4x a week headache-1x week migraine   History of hiatal hernia    Hyperlipidemia    Hypertension    Controlled on meds   Mitral valve prolapse    Nose colonized with MRSA 05/15/2018   Osteopenia    Pneumonia    PONV (postoperative nausea and vomiting)    Shingles 11/2006   Sleep disturbance    Tachycardia    Vertigo    Hx of    Patient Active Problem List   Diagnosis Date Noted   Synovitis 04/12/2021   DOE (dyspnea on exertion) 03/16/2021   Right flank pain  03/16/2021   Stage 3a chronic kidney disease ([place]) 01/14/2021   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/17/2020   History of arthroscopic procedure on shoulder 01/09/2020   Gouty arthritis of right great toe 07/04/2019   Contracture of finger joint, left 07/04/2019   Status post total hip replacement, right 05/29/2018   Low back pain 06/18/2017   Varicella zoster 07/10/2016   Recurrent cold sores 03/07/2016   Type 2 diabetes mellitus with hyperglycemia ([place]) 11/04/2014   Colitis 03/17/2014   Noninfective gastroenteritis and colitis 03/17/2014   Routine general medical examination at a health care facility 09/16/2012   Mixed hyperlipidemia    Type 2 diabetes mellitus with neurological manifestations, controlled ([place])    FIBROCYSTIC BREAST DISEASE 06/12/2007   Essential hypertension, benign 05/30/2007   MITRAL VALVE PROLAPSE 05/30/2007   ASTHMA 05/30/2007   OSTEOPENIA 05/30/2007   Glaucoma 05/30/2007   Mitral valve disorder 05/30/2007   ALLERGIC RHINITIS 03/20/2007   GERD 03/20/2007    Past Surgical History:  Procedure Laterality Date   ABDOMINAL HYSTERECTOMY  2000   /BSO - abn. [facility]   APPENDECTOMY  1978   AUGMENTATION MAMMAPLASTY Bilateral 1981   BLADDER REPAIR     x2   BREAST ENHANCEMENT SURGERY  1987   CARDIAC CATHETERIZATION     [facility]   CARDIAC CATHETERIZATION Left 12/18/2016   Procedure: Left  Heart Cath and Coronary Angiography;  Surgeon: Wellington Hampshire, MD;  Location: [facility] [place] [facility];  Service: Cardiovascular;  Laterality: Left;   CARPAL TUNNEL RELEASE Right    CERVICAL DISCECTOMY  11/2009   with synthetic discs inserted--  Dr Arnoldo Morale   COLONOSCOPY     JOINT REPLACEMENT     NM MYOVIEW LTD  03/2005   EF 56%, no ischemia   RECTAL PROLAPSE REPAIR     x1   SHOULDER ARTHROSCOPY WITH OPEN ROTATOR CUFF REPAIR Left 12/29/2019   Procedure: LEFT SHOULDER ARTHROSCOPIC DISTAL CLAVICLE EXCISISON, SUBACROMIAL DECOMPRESSION,BICEPS [place] ;  Surgeon:  Lovell Sheehan, MD;  Location: [facility];  Service: Orthopedics;  Laterality: Left;   SHOULDER CLOSED REDUCTION Left 05/03/2020   Procedure: Left shoulder arthroscopic lysis of adhesions and manipulation under anesthesia;  Surgeon: Lovell Sheehan, MD;  Location: [facility];  Service: Orthopedics;  Laterality: Left;   SHOULDER SURGERY  09/2008, 01/2009   Right shoulder surgery, then redone with lysis of adhesions   TOTAL HIP ARTHROPLASTY Right 05/29/2018   Procedure: TOTAL HIP ARTHROPLASTY ANTERIOR APPROACH;  Surgeon: Lovell Sheehan, MD;  Location: [facility];  Service: Orthopedics;  Laterality: Right;   UPPER GI ENDOSCOPY      Prior to Admission medications   Medication Sig Start Date End Date Taking? Authorizing Provider  ondansetron (ZOFRAN ODT) 4 MG disintegrating tablet Take 1 tablet (4 mg total) by mouth every 6 (six) hours as needed for nausea or vomiting. 09/16/21  Yes Dakia Schifano, Delice Bison, DO  oxyCODONE-acetaminophen (PERCOCET) 5-325 MG tablet Take 1 tablet by mouth every 4 (four) hours as needed for severe pain. 09/16/21 09/16/22 Yes Genieve Ramaswamy, Delice Bison, DO  aspirin-acetaminophen-caffeine (EXCEDRIN MIGRAINE) [phone] MG tablet Take 1 tablet by mouth every 6 (six) hours as needed for headache.    [provider]  Blood Glucose Monitoring Suppl (FIFTY50 GLUCOSE METER 2.0) w/Device KIT OneTouch Ultra2 Meter kit    [provider]  cetirizine (ZYRTEC) 10 MG tablet Take 10 mg by mouth daily.     [provider]  clobetasol (TEMOVATE) 0.05 % external solution Apply 1 application topically as directed. 1-2 gtts qd/bid aa scalp prn flares, avoid face, groin, axilla 11/08/20   Brendolyn Patty, MD  Continuous Blood Gluc Receiver ([place]) [place] by Does not apply route.    [provider]  Continuous Blood Gluc Sensor (DEXCOM G6 SENSOR) MISC Use one sensor every 10 days 04/07/21   Philemon Kingdom, MD  Continuous Blood Gluc Transmit (DEXCOM G6 TRANSMITTER) MISC 1  Device by Does not apply route every 3 (three) months. 02/15/21   Philemon Kingdom, MD  cyclobenzaprine (FLEXERIL) 5 MG tablet Take 1-2 tablets (5-10 mg total) by mouth at bedtime as needed for muscle spasms. 04/12/21   Venia Carbon, MD  docusate sodium (COLACE) 100 MG capsule Take 300 mg by mouth at bedtime.     [provider]  DULoxetine (CYMBALTA) 30 MG capsule Take 1 capsule by mouth once daily 08/15/21   Venia Carbon, MD  empagliflozin (JARDIANCE) 10 MG TABS tablet Take 1 tablet (10 mg total) by mouth daily before breakfast. 07/22/21   Philemon Kingdom, MD  fluticasone (FLONASE) 50 MCG/ACT nasal spray Place 1 spray into both nostrils daily.    [provider]  furosemide (LASIX) 20 MG tablet Take 1 tablet by mouth once daily 07/07/21   Venia Carbon, MD  Glucagon 3 MG/DOSE POWD Place 3 mg into the nose  once as needed for up to 1 dose. 07/22/21   Philemon Kingdom, MD  Insulin Disposable Pump (OMNIPOD DASH PODS, GEN 4,) MISC Use 1 every day 04/07/21   Philemon Kingdom, MD  insulin regular human CONCENTRATED (HUMULIN R) 500 UNIT/ML injection Use up to 200 units of concentrated insulin in the insulin pump daily 04/07/21   Philemon Kingdom, MD  LUMIGAN 0.01 % SOLN Place 1 drop into both eyes at bedtime.  10/13/15   [provider]  metoprolol succinate (TOPROL-XL) 25 MG 24 hr tablet TAKE 1 AND 1/2 TABLETS BY  MOUTH DAILY 11/30/20   Viviana Simpler I, MD  mometasone (ELOCON) 0.1 % cream Apply 1 application topically as directed. Qd to bid aa hands prn flares 11/08/20   Brendolyn Patty, MD  [facility] DELICA LANCETS FINE MISC Check blood sugar once daily and as needed. 250.00 05/26/14   Venia Carbon, MD  ONETOUCH ULTRA test strip USE TO CHECK BLOOD SUGAR 2  TIMES DAILY 12/06/20   Viviana Simpler I, MD  pantoprazole (PROTONIX) 40 MG tablet TAKE 1 TABLET BY MOUTH  TWICE DAILY ON AN EMPTY  STOMACH 02/21/21   Viviana Simpler I, MD  rosuvastatin (CRESTOR) 5 MG tablet TAKE 1  TABLET BY MOUTH 3  TIMES WEEKLY 12/06/20   Venia Carbon, MD  sucralfate (CARAFATE) 1 g tablet Take 1 tablet (1 g total) by mouth in the morning, at noon, and at bedtime. 10/11/20   Venia Carbon, MD  traZODone (DESYREL) 100 MG tablet TAKE 2 TABLETS BY MOUTH  EVERY NIGHT AT BEDTIME 06/20/21   Venia Carbon, MD  triamterene-hydrochlorothiazide (LYYTKPT-46) 37.5-25 MG tablet TAKE 1 TABLET BY MOUTH ONCE DAILY 12/06/20   Venia Carbon, MD  valACYclovir (VALTREX) 1000 MG tablet TAKE 2 TABLETS BY MOUTH  AT ONSET OF SYMPTOMS AS NEEDED FOR FEVER BLISTERS AND TAKE 2 TABLETS 12 HOURS LATER. 06/20/21   Venia Carbon, MD    Allergies Humalog [insulin lispro], Lisinopril, Nitrofurantoin, Oxycodone-acetaminophen, Percocet [oxycodone-acetaminophen], Atorvastatin, Clarithromycin, Glipizide, and Invokana [canagliflozin]  Family History  Problem Relation Age of Onset   Diabetes Mother    Heart disease Mother    Hypertension Mother    Ovarian cancer Mother    Melanoma Mother    Cancer Father    Coronary artery disease Father    Heart disease Father    Diabetes Sister    Depression Sister    Coronary artery disease Sister    Heart disease Sister    Rheum arthritis Maternal Aunt    Breast cancer Maternal Grandmother    Diabetes Maternal Grandmother    Heart attack Paternal Uncle    Heart attack Cousin    Heart attack Paternal Uncle    Diabetes Maternal Grandfather    Colon cancer Neg Hx     Social History Social History   Tobacco Use   Smoking status: Never   Smokeless tobacco: Never  Vaping Use   Vaping Use: Never used  Substance Use Topics   Alcohol use: No   Drug use: No    Review of Systems Constitutional: No fever. Eyes: No visual changes. ENT: No sore throat. Cardiovascular: Denies chest pain. Respiratory: Denies shortness of breath. Gastrointestinal: No nausea, vomiting, diarrhea. Genitourinary: Negative for dysuria. Musculoskeletal: Negative for back  pain. Skin: Negative for rash. Neurological: Negative for focal weakness or numbness.  ____________________________________________   PHYSICAL EXAM:  VITAL SIGNS: ED Triage Vitals [09/15/21 1753]  Enc Vitals Group     BP 125/86  Pulse Rate (!) 107     Resp (!) 22     Temp 98.9 F (37.2 C)     Temp Source Oral     SpO2 98 %     Weight 159 lb (72.1 kg)     Height '5\' 4"'  (1.626 m)     Head Circumference      Peak Flow      Pain Score 8     Pain Loc      Pain Edu?      Excl. in [place]?    CONSTITUTIONAL: Alert and oriented and responds appropriately to questions. Well-appearing; well-nourished HEAD: Normocephalic EYES: Conjunctivae clear, pupils appear equal, EOM appear intact ENT: normal nose; moist mucous membranes NECK: Supple, normal ROM CARD: RRR; S1 and S2 appreciated; no murmurs, no clicks, no rubs, no gallops RESP: Normal chest excursion without splinting or tachypnea; breath sounds clear and equal bilaterally; no wheezes, no rhonchi, no rales, no hypoxia or respiratory distress, speaking full sentences ABD/GI: Normal bowel sounds; non-distended; soft, tender to palpation in the left pelvic and left lower quadrant, no rebound, no guarding, no peritoneal signs, no hepatosplenomegaly BACK: The back appears normal EXT: Normal ROM in all joints; no deformity noted, no edema; no cyanosis SKIN: Normal color for age and race; warm; no rash on exposed skin NEURO: Moves all extremities equally PSYCH: The patient's mood and manner are appropriate.  ____________________________________________   LABS (all labs ordered are listed, but only abnormal results are displayed)  Labs Reviewed  BASIC METABOLIC PANEL - Abnormal; Notable for the following components:      Result Value   Glucose, Bld 251 (*)    All other components within normal limits  CBC WITH DIFFERENTIAL/PLATELET - Abnormal; Notable for the following components:   WBC 12.3 (*)    Hemoglobin 11.7 (*)    MCV 74.4  (*)    MCH 23.8 (*)    RDW 15.7 (*)    Platelets 408 (*)    Neutro Abs 8.5 (*)    Monocytes Absolute 1.1 (*)    All other components within normal limits  URINALYSIS, ROUTINE W REFLEX MICROSCOPIC - Abnormal; Notable for the following components:   Color, Urine YELLOW (*)    APPearance CLEAR (*)    Glucose, UA >=500 (*)    All other components within normal limits  URINE CULTURE  HEPATIC FUNCTION PANEL  LIPASE, BLOOD   ____________________________________________  EKG   ____________________________________________  RADIOLOGY I, Lodie Waheed, personally viewed and evaluated these images (plain radiographs) as part of my medical decision making, as well as reviewing the written report by the radiologist.  ED MD interpretation: CT scan shows no acute abnormality.  Official radiology report(s): CT ABDOMEN PELVIS WO CONTRAST  Result Date: 09/15/2021 CLINICAL DATA:  Left flank pain, suspected diverticulitis EXAM: CT ABDOMEN AND PELVIS WITHOUT CONTRAST TECHNIQUE: Multidetector CT imaging of the abdomen and pelvis was performed following the standard protocol without IV contrast. COMPARISON:  08/17/2015 FINDINGS: Lower chest: No pleural or pericardial effusion. Linear scarring or atelectasis in the lung bases. Hepatobiliary: No focal liver abnormality is seen. No gallstones, gallbladder wall thickening, or biliary dilatation. Pancreas: Unremarkable. No pancreatic ductal dilatation or surrounding inflammatory changes. Spleen: Normal in size without focal abnormality. Adrenals/Urinary Tract: Adrenal glands are unremarkable. Kidneys are normal, without renal calculi, focal lesion, or hydronephrosis. Bladder is unremarkable. Stomach/Bowel: Stomach is incompletely distended, unremarkable. The small bowel is nondilated. Appendix not identified. The colon is nondilated with scattered diverticula. There  is asymmetric wall thickening suspected in the mid sigmoid colon, evaluation limited from  incomplete distension; no significant adjacent inflammatory change. Vascular/Lymphatic: No mesenteric adenopathy. No abdominal or pelvic adenopathy. Patchy aortic calcifications without aneurysm. Reproductive: Status post hysterectomy. No adnexal masses. Other: Bilateral pelvic phleboliths.  No ascites.  No free air. Musculoskeletal: Degenerative disc disease L5-S1. Right hip arthroplasty components, resulting some streak artifact. No fracture or worrisome bone lesion. IMPRESSION: 1. No acute findings. 2. Asymmetric wall thickening suspected in the mid sigmoid colon, incompletely evaluated. Correlate with outpatient elective colonoscopy or CT colonography to rule out mass. 3.  Colonic diverticulosis 4.  Aortic Atherosclerosis (ICD10-170.0). Electronically Signed   By: Lucrezia Europe M.D.   On: 09/15/2021 19:01    ____________________________________________   PROCEDURES  Procedure(s) performed (including Critical Care):  Procedures   ____________________________________________   INITIAL IMPRESSION / ASSESSMENT AND PLAN / ED COURSE  As part of my medical decision making, I reviewed the following data within the [place] notes reviewed and incorporated, Labs reviewed , Old chart reviewed, CT reviewed, Notes from prior ED visits, and [place] Controlled Substance Database         Patient here with complaints of left-sided abdominal pain.  Differential includes diverticulitis, colitis, bowel obstruction, constipation, UTI, kidney stone, pyelonephritis.  Labs, urine, CT imaging were obtained in triage.  Labs show mild leukocytosis with left shift.  Glucose is also slightly elevated but no sign of DKA.  LFTs, lipase normal.  Urine shows no sign of infection.  CT scan shows no acute findings.  There is some asymmetric wall thickening in the mid sigmoid colon which may be due to incomplete distention but radiologist recommends correlating with outpatient elective colonoscopy to rule  out mass.  There is no sign of inflammation around this area of the sigmoid colon to suggest colitis, ischemia.  Discussed these findings with patient.  She does have a GI doctor for follow-up.  No previous history of colon cancer.  CT shows no sign of bowel obstruction today.  Given she is having lower abdominal pain we will add on a urine culture although she states this feels different than previous UTIs.  She has had a total hysterectomy and states that her ovaries have both been removed.  She has also had an appendectomy.  We also discussed this could be constipation.  She takes a stool softener every day.  Recommended that she add MiraLAX to this regimen especially since we are discharging her home with pain medication.  I recommended close follow-up with her primary care doctor as well as her gastroenterologist especially if symptoms or not improving.  We discussed at length return precautions.  She verbalized understanding and is comfortable with this plan.  At this time, I do not feel there is any life-threatening condition present. I have reviewed, interpreted and discussed all results (EKG, imaging, lab, urine as appropriate) and exam findings with patient/family. I have reviewed nursing notes and appropriate previous records.  I feel the patient is safe to be discharged home without further emergent workup and can continue workup as an outpatient as needed. Discussed usual and customary return precautions. Patient/family verbalize understanding and are comfortable with this plan.  Outpatient follow-up has been provided as needed. All questions have been answered.  ____________________________________________   FINAL CLINICAL IMPRESSION(S) / ED DIAGNOSES  Final diagnoses:  Left sided abdominal pain     ED Discharge Orders          Ordered  oxyCODONE-acetaminophen (PERCOCET) 5-325 MG tablet  Every 4 hours PRN        09/16/21 0132    ondansetron (ZOFRAN ODT) 4 MG disintegrating tablet   Every 6 hours PRN        09/16/21 0132            *Please note:  Shannon [name] was evaluated in Emergency Department on 09/16/2021 for the symptoms described in the history of present illness. She was evaluated in the context of the global COVID-19 pandemic, which necessitated consideration that the patient might be at risk for infection with the SARS-CoV-2 virus that causes COVID-19. Institutional protocols and algorithms that pertain to the evaluation of patients at risk for COVID-19 are in a state of rapid change based on information released by regulatory bodies including the CDC and federal and state organizations. These policies and algorithms were followed during the patient's care in the ED.  Some ED evaluations and interventions may be delayed as a result of limited staffing during and the pandemic.*   Note:  This document was prepared using Dragon voice recognition software and may include unintentional dictation errors.    Laurian Edrington, Delice Bison, DO 09/16/21 [phone]

## 2021-09-16 NOTE — Assessment & Plan Note (Addendum)
Severe and ?colicky It certainly could be some colitis related to the recent antibiotics The CT finding in the sigmoid is likely not a true issue (with unremarkable colon 1 year ago)--but she should go back to the GI doctor just in case Could be inguinal pain as well--but the tenderness seems more abdominal and no clear hernia (could have had strain from coughing recently)  Recommended that she continue the probiotic for now

## 2021-09-16 NOTE — Progress Notes (Signed)
Subjective:    Patient ID: Shannon [name], female    DOB: 1956/03/29, 65 y.o.   MRN: [id number]  HPI Here for ER follow up This visit occurred during the SARS-CoV-2 public health emergency.  Safety protocols were in place, including screening questions prior to the visit, additional usage of staff PPE, and extensive cleaning of exam room while observing appropriate contact time as indicated for disinfecting solutions.   Has been having left groin/LLQ pain for a while (1 week or so) Intermittent severe pain---cramping, hot feeling Bowels not going well Only small liquidy stools--senna s---3 each night. Tried dulcolax nightly x 3  No change in appetite Eating okay Weight up some Nauseated and vomiting once yesterday No blood in stool  Current Outpatient Medications on File Prior to Visit  Medication Sig Dispense Refill   aspirin-acetaminophen-caffeine (EXCEDRIN MIGRAINE) 250-250-65 MG tablet Take 1 tablet by mouth every 6 (six) hours as needed for headache.     Blood Glucose Monitoring Suppl (FIFTY50 GLUCOSE METER 2.0) w/Device KIT OneTouch Ultra2 Meter kit     cetirizine (ZYRTEC) 10 MG tablet Take 10 mg by mouth daily.      Continuous Blood Gluc Receiver ([place]) DEVI by Does not apply route.     Continuous Blood Gluc Sensor (DEXCOM G6 SENSOR) MISC Use one sensor every 10 days 9 each 1   Continuous Blood Gluc Transmit (DEXCOM G6 TRANSMITTER) MISC 1 Device by Does not apply route every 3 (three) months. 1 each 3   docusate sodium (COLACE) 100 MG capsule Take 300 mg by mouth at bedtime.      DULoxetine (CYMBALTA) 30 MG capsule Take 1 capsule by mouth once daily 90 capsule 3   empagliflozin (JARDIANCE) 10 MG TABS tablet Take 1 tablet (10 mg total) by mouth daily before breakfast. 90 tablet 3   fluticasone (FLONASE) 50 MCG/ACT nasal spray Place 1 spray into both nostrils daily.     furosemide (LASIX) 20 MG tablet Take 1 tablet by mouth once daily 30 tablet 11   Glucagon 3  MG/DOSE POWD Place 3 mg into the nose once as needed for up to 1 dose. 1 each 11   Insulin Disposable Pump (OMNIPOD DASH PODS, GEN 4,) MISC Use 1 every day 18 each 3   insulin regular human CONCENTRATED (HUMULIN R) 500 UNIT/ML injection Use up to 200 units of concentrated insulin in the insulin pump daily 20 mL 3   LUMIGAN 0.01 % SOLN Place 1 drop into both eyes at bedtime.      metoprolol succinate (TOPROL-XL) 25 MG 24 hr tablet TAKE 1 AND 1/2 TABLETS BY  MOUTH DAILY 135 tablet 3   ondansetron (ZOFRAN ODT) 4 MG disintegrating tablet Take 1 tablet (4 mg total) by mouth every 6 (six) hours as needed for nausea or vomiting. 20 tablet 0   ONETOUCH DELICA LANCETS FINE MISC Check blood sugar once daily and as needed. 250.00 100 each 1   ONETOUCH ULTRA test strip USE TO CHECK BLOOD SUGAR 2  TIMES DAILY 200 strip 3   oxyCODONE-acetaminophen (PERCOCET) 5-325 MG tablet Take 1 tablet by mouth every 4 (four) hours as needed for severe pain. 14 tablet 0   pantoprazole (PROTONIX) 40 MG tablet TAKE 1 TABLET BY MOUTH  TWICE DAILY ON AN EMPTY  STOMACH 180 tablet 3   rosuvastatin (CRESTOR) 5 MG tablet TAKE 1 TABLET BY MOUTH 3  TIMES WEEKLY 39 tablet 3   sucralfate (CARAFATE) 1 g tablet Take 1 tablet (  1 g total) by mouth in the morning, at noon, and at bedtime. 270 tablet 3   traZODone (DESYREL) 100 MG tablet TAKE 2 TABLETS BY MOUTH  EVERY NIGHT AT BEDTIME 180 tablet 3   triamterene-hydrochlorothiazide (MAXZIDE-25) 37.5-25 MG tablet TAKE 1 TABLET BY MOUTH ONCE DAILY 90 tablet 3   valACYclovir (VALTREX) 1000 MG tablet TAKE 2 TABLETS BY MOUTH  AT ONSET OF SYMPTOMS AS NEEDED FOR FEVER BLISTERS AND TAKE 2 TABLETS 12 HOURS LATER. 90 tablet 0   clobetasol (TEMOVATE) 0.05 % external solution Apply 1 application topically as directed. 1-2 gtts qd/bid aa scalp prn flares, avoid face, groin, axilla (Patient not taking: Reported on 09/16/2021) 50 mL 0   No current facility-administered medications on file prior to visit.     Allergies  Allergen Reactions   Humalog [Insulin Lispro] Swelling   Lisinopril Cough   Nitrofurantoin Other (See Comments)    Pt unsure   Oxycodone-Acetaminophen Itching   Percocet [Oxycodone-Acetaminophen]    Atorvastatin Diarrhea    May have caused colitis--but not clear cut   Clarithromycin Palpitations   Glipizide Other (See Comments)    Sugars too variable   Invokana [Canagliflozin]     Nausea, constipation    Past Medical History:  Diagnosis Date   Allergy    Arthritis    hip, back, shoulders, joints   Asthma    Complication of anesthesia    problems with block during breast augmentation and epidural during childbirth   Depression    Diabetes mellitus 11/12   type 2   Family history of adverse reaction to anesthesia    PROBLEMS WITH BLOCKS   Fatty liver    GERD (gastroesophageal reflux disease)    Glaucoma suspect    Headache    migraines-4x a week headache-1x week migraine   History of hiatal hernia    Hyperlipidemia    Hypertension    Controlled on meds   Mitral valve prolapse    Nose colonized with MRSA 05/15/2018   Osteopenia    Pneumonia    PONV (postoperative nausea and vomiting)    Shingles 11/2006   Sleep disturbance    Tachycardia    Vertigo    Hx of    Past Surgical History:  Procedure Laterality Date   ABDOMINAL HYSTERECTOMY  2000   /BSO - abn. Paps   APPENDECTOMY  1978   AUGMENTATION MAMMAPLASTY Bilateral 1981   BLADDER REPAIR     x2   BREAST ENHANCEMENT SURGERY  1987   CARDIAC CATHETERIZATION     [facility]   CARDIAC CATHETERIZATION Left 12/18/2016   Procedure: Left Heart Cath and Coronary Angiography;  Surgeon: Wellington Hampshire, MD;  Location: [facility];  Service: Cardiovascular;  Laterality: Left;   CARPAL TUNNEL RELEASE Right    CERVICAL DISCECTOMY  11/2009   with synthetic discs inserted--  Dr Arnoldo Morale   COLONOSCOPY     JOINT REPLACEMENT     NM MYOVIEW LTD  03/2005   EF 56%, no ischemia   RECTAL PROLAPSE REPAIR      x1   SHOULDER ARTHROSCOPY WITH OPEN ROTATOR CUFF REPAIR Left 12/29/2019   Procedure: LEFT SHOULDER ARTHROSCOPIC DISTAL CLAVICLE EXCISISON, SUBACROMIAL DECOMPRESSION,BICEPS [place] ;  Surgeon: Lovell Sheehan, MD;  Location: [facility];  Service: Orthopedics;  Laterality: Left;   SHOULDER CLOSED REDUCTION Left 05/03/2020   Procedure: Left shoulder arthroscopic lysis of adhesions and manipulation under anesthesia;  Surgeon: Lovell Sheehan, MD;  Location: [facility];  Service: Orthopedics;  Laterality: Left;   SHOULDER SURGERY  09/2008, 01/2009   Right shoulder surgery, then redone with lysis of adhesions   TOTAL HIP ARTHROPLASTY Right 05/29/2018   Procedure: TOTAL HIP ARTHROPLASTY ANTERIOR APPROACH;  Surgeon: Lovell Sheehan, MD;  Location: [facility];  Service: Orthopedics;  Laterality: Right;   UPPER GI ENDOSCOPY      Family History  Problem Relation Age of Onset   Diabetes Mother    Heart disease Mother    Hypertension Mother    Ovarian cancer Mother    Melanoma Mother    Cancer Father    Coronary artery disease Father    Heart disease Father    Diabetes Sister    Depression Sister    Coronary artery disease Sister    Heart disease Sister    Rheum arthritis Maternal Aunt    Breast cancer Maternal Grandmother    Diabetes Maternal Grandmother    Heart attack Paternal Uncle    Heart attack Cousin    Heart attack Paternal Uncle    Diabetes Maternal Grandfather    Colon cancer Neg Hx     Social History   Socioeconomic History   Marital status: Married    Spouse name: Not on file   Number of children: 5   Years of education: Not on file   Highest education level: Not on file  Occupational History   Occupation: homemaker  Tobacco Use   Smoking status: Never   Smokeless tobacco: Never  Vaping Use   Vaping Use: Never used  Substance and Sexual Activity   Alcohol use: No   Drug use: No   Sexual activity: Not on file  Other Topics Concern   Not on file   Social History Narrative   Home maker   Doesn't exercise      Judeen Hammans Talley's sister         Social Determinants of Health   Financial Resource Strain: Not on file  Food Insecurity: Not on file  Transportation Needs: Not on file  Physical Activity: Not on file  Stress: Not on file  Social Connections: Not on file  Intimate Partner Violence: Not on file   Review of Systems Just got over pneumonia--diagnosed at walk in clinic (CXR) Done with antibiotics--doxy/augmentin (finished ~10/21) Breathing is better now Diagnosed with psoriatic arthritis/?ankylosing spondilitis (reluctant to take injectables)    Objective:   Physical Exam Constitutional:      Appearance: She is well-developed.  Cardiovascular:     Rate and Rhythm: Normal rate and regular rhythm.     Heart sounds: No murmur heard.   No gallop.  Pulmonary:     Effort: Pulmonary effort is normal.     Breath sounds: Normal breath sounds. No wheezing or rales.  Abdominal:     General: There is no distension.     Palpations: Abdomen is soft. There is no mass.     Comments: Significant LLL tenderness--but no rebound Has inguinal pain and tenderness which is less striking----but no clear hernia  Musculoskeletal:     Cervical back: Neck supple.     Right lower leg: No edema.     Left lower leg: No edema.  Lymphadenopathy:     Cervical: No cervical adenopathy.  Neurological:     Mental Status: She is alert.           Assessment & Plan:

## 2021-09-16 NOTE — Telephone Encounter (Signed)
Actually better for her to be coming in person

## 2021-09-16 NOTE — Discharge Instructions (Addendum)
You are being provided a prescription for opiates (also known as narcotics) for pain control.  Opiates can be addictive and should only be used when absolutely necessary for pain control when other alternatives do not work.  We recommend you only use them for the recommended amount of time and only as prescribed.  Please do not take with other sedative medications or alcohol.  Please do not drive, operate machinery, make important decisions while taking opiates.  Please note that these medications can be addictive and have high abuse potential.  Patients can become addicted to narcotics after only taking them for a few days.  Please keep these medications locked away from children, teenagers or any family members with history of substance abuse.  Narcotic pain medicine may also make you constipated.  You may use over-the-counter medications such as MiraLAX, Colace to prevent constipation.  If you become constipated you may use over-the-counter enemas as needed.  Itching and nausea are common side effects of narcotic pain medication.  If you develop uncontrolled vomiting or a rash, please stop these medications.   I recommend close follow-up with your primary care physician as well as your gastroenterologist.  Your CT scan showed "There is asymmetric wall thickening suspected in the mid sigmoid colon, evaluation limited from incomplete distension; no significant adjacent inflammatory change. "  And the radiologist recommended follow-up with a colonoscopy or CT colonography as an outpatient for further evaluation and to exclude a colonic mass.

## 2021-09-16 NOTE — Patient Instructions (Signed)
Please set up a follow up with your GI doctor. I think you should try the medication suggested for the psoriatic arthritis.

## 2021-09-16 NOTE — Telephone Encounter (Signed)
[facility] [facility] Night - Client Nonclinical Telephone Record  [facility] Client [place] Night - Client Client Site [place] Physician Viviana Simpler- MD Contact Type Call Who Is Calling Patient / Member / Family / Caregiver Caller Name Shannon [name] Caller Phone Number [phone] Patient Name Shannon [name] Patient DOB 08/14/1956 Call Type Message Only Information Provided Reason for Call Request to Reschedule Office Appointment Initial Comment Caller states she have a 1015 appointment this morning. She has been in the ER last night so she want to change the appointment to a virtual visit Patient request to speak to RN No Additional Comment Please call her to offer a virtual appointment. Disp. Time Disposition Final User 09/16/2021 8:11:25 AM General Information Provided Yes Renne Crigler Call Closed By: Renne Crigler Transaction Date/Time: 09/16/2021 8:07:42 AM (ET

## 2021-09-16 NOTE — Telephone Encounter (Signed)
Pt being seen in office

## 2021-09-21 ENCOUNTER — Telehealth: Payer: Self-pay | Admitting: Internal Medicine

## 2021-09-21 DIAGNOSIS — E1142 Type 2 diabetes mellitus with diabetic polyneuropathy: Secondary | ICD-10-CM

## 2021-09-21 NOTE — Telephone Encounter (Signed)
MEDICATION: empagliflozin (JARDIANCE) 10 MG TABS tablet  PHARMACY:   [facility]  ([place]) [place], [place] Phone:  [phone]  Fax:  [phone]      HAS THE PATIENT CONTACTED [place]?  Yes-changed PHARM for this medication to [place] A 90 DAY SUPPLY : Yes  IS PATIENT OUT OF MEDICATION: No  IF NOT; HOW MUCH IS LEFT: Approx. 3 weeks  LAST APPOINTMENT DATE: @10 /28/2022  NEXT APPOINTMENT DATE:@12 /12/2020  DO WE HAVE YOUR PERMISSION TO LEAVE A DETAILED MESSAGE?: Yes  OTHER COMMENTS:    **Let patient know to contact pharmacy at the end of the day to make sure medication is ready. **  ** Please notify patient to allow 48-72 hours to process**  **Encourage patient to contact the pharmacy for refills or they can request refills through [facility]**

## 2021-09-22 MED ORDER — DULOXETINE HCL 30 MG PO CPEP: 30.0000 mg | ORAL_CAPSULE | Freq: Every day | ORAL | 3 refills | Status: DC

## 2021-09-22 MED ORDER — EMPAGLIFLOZIN 10 MG PO TABS: 10.0000 mg | ORAL_TABLET | Freq: Every day | ORAL | 2 refills | Status: DC

## 2021-09-22 MED ORDER — FUROSEMIDE 20 MG PO TABS: 20.0000 mg | ORAL_TABLET | Freq: Every day | ORAL | 3 refills | Status: DC

## 2021-09-22 NOTE — Addendum Note (Signed)
Addended by: Lauralyn Primes on: 09/22/2021 08:14 AM   Modules accepted: Orders

## 2021-09-22 NOTE — Telephone Encounter (Signed)
Rx sent to preferred pharmacy.

## 2021-09-28 ENCOUNTER — Ambulatory Visit: Payer: 59 | Admitting: Internal Medicine

## 2021-10-07 ENCOUNTER — Other Ambulatory Visit: Payer: Self-pay | Admitting: Internal Medicine

## 2021-10-17 ENCOUNTER — Ambulatory Visit: Payer: 59 | Admitting: Dermatology

## 2021-10-21 ENCOUNTER — Ambulatory Visit: Payer: 59 | Admitting: Internal Medicine

## 2021-11-15 ENCOUNTER — Other Ambulatory Visit: Payer: Self-pay

## 2021-11-15 ENCOUNTER — Encounter: Payer: Self-pay | Admitting: Internal Medicine

## 2021-11-15 ENCOUNTER — Ambulatory Visit: Payer: 59 | Admitting: Internal Medicine

## 2021-11-15 VITALS — BP 122/72 | HR 103 | Ht 64.0 in | Wt 164.2 lb

## 2021-11-15 DIAGNOSIS — E1165 Type 2 diabetes mellitus with hyperglycemia: Secondary | ICD-10-CM | POA: Diagnosis not present

## 2021-11-15 DIAGNOSIS — E782 Mixed hyperlipidemia: Secondary | ICD-10-CM

## 2021-11-15 DIAGNOSIS — E1142 Type 2 diabetes mellitus with diabetic polyneuropathy: Secondary | ICD-10-CM | POA: Diagnosis not present

## 2021-11-15 LAB — POCT GLYCOSYLATED HEMOGLOBIN (HGB A1C): Hemoglobin A1C: 8.2 % — AB (ref 4.0–5.6)

## 2021-11-15 NOTE — Patient Instructions (Addendum)
Please use the following pump settings: - basal rates: 12 am: 0.9 >> 1.0 3 am: 0.9  6 am: 0.7  9 am: 0.7 >> 0.8 9 PM: 0.9 >> 1.1 - ICR:   12 AM: 1: 25 >> 1:22 8 PM: 1: 16 >> 1:13 - target: 120-120  - ISF: 1:60 - Insulin on Board: 6 hours  Please continue: - Jardiance 10 mg before b'fast.  Please return in 3-4 months.

## 2021-11-15 NOTE — Progress Notes (Signed)
Patient ID: ZOIEE WIMMER, female   DOB: [birth year], 65 y.o.   MRN: [id number]   This visit occurred during the SARS-CoV-2 public health emergency.  Safety protocols were in place, including screening questions prior to the visit, additional usage of staff PPE, and extensive cleaning of exam room while observing appropriate contact time as indicated for disinfecting solutions.   HPI: ICEIS KNAB is a 65 y.o.-year-old female, initially referred by her PCP, Dr. Silvio Pate, returning for follow-up for DM2, dx in 2012, insulin-dependent since ~2019, uncontrolled, with complications (peripheral neuropathy). Last visit he was continued on 4 months ago.    Interim history: At last visit, she had more fatigue and she was taking care of her mother who had a fall.  At this visit, she mentions that she is still taking care of her mother and is now living with her.  She is not working.  She is not sleeping well and she is very stressed. Her blurry vision improved (had laser surgery for glaucoma 05/2021).  She has occasional nausea.   She has psoriatic arthritis and ankylosing spondylitis. She has significant joint pain - hands, back, hips.  She is now on a biologic infusion. No increased urination.  Reviewed history: Patient described that her sugars worsened after she had to stop Metformin due to GI symptoms and was changed to sulfonylurea. They increased even further after steroid injection for Achilles tendinitis.  Her sugars were initially in the 300-400 range, but then increased to 500-HI (>600) >> she presented to the emergency room (11/12/2020).  Afterwards, she started to work on her diet >> she tells me that she was barely eating anything throughout the day. Sugars improved some, but they were still elevated.  Insulin pump: -OmniPod Dash  Insulin: -She has allergy to Humalog/Lyumjev >> significant irritation and inflammation at the infusion site -Switched to NovoLog samples since NovoLog was not  covered by her insurance despite 2 preauthorization requests - U500 now  CGM: -Dexcom G6  Supplies: -Mail-order pharmacy for Dexcom  Reviewed HbA1c levels: Lab Results  Component Value Date   HGBA1C 8.0 (A) 07/22/2021   HGBA1C 10.4 (A) 02/15/2021   HGBA1C 9.0 (A) 11/16/2020   HGBA1C 7.8 (A) 07/13/2020   HGBA1C 8.1 (H) 12/22/2019   HGBA1C 7.3 (A) 07/04/2019   HGBA1C 8.9 (H) 01/22/2019   HGBA1C 8.8 (H) 10/15/2018   HGBA1C 7.9 (H) 02/13/2018   HGBA1C 7.0 (H) 09/19/2017   HGBA1C 8.6 (H) 04/13/2017   HGBA1C 8.2 (H) 11/16/2016   HGBA1C 7.4 (H) 05/17/2016   HGBA1C 7.8 (H) 11/19/2015   HGBA1C 7.2 (H) 04/23/2015   HGBA1C 8.4 (H) 01/14/2015   HGBA1C 8.6 (H) 09/22/2014   HGBA1C 8.0 (H) 03/31/2014   HGBA1C 6.9 (H) 09/23/2013   HGBA1C 7.0 (H) 03/17/2013   HGBA1C 6.5 09/16/2012   HGBA1C 6.6 (H) 02/20/2012   HGBA1C 9.0 (H) 10/19/2011   Previously on: - Glimepiride 2 >> 4 mg before breakfast   - Onglyza 5 mg before breakfast - Lantus 30-35 units at bedtime She is intolerant to Metformin >> N/V/D >> had to stop and changed to Glimepiride.  Previously on: - Ozempic 0.5 mg weekly >> stopped 2/2 nausea - Lantus 26 >> 34 >> Toujeo 46 units at bedtime - Lyumjev 6-10 >> 12-18 >> 15 >> 20 units before each meal  Now on: - Farxiga 10 mg before breakfast- tried this in 03/2021 (not covered) >> retried 07/2021 - Pump settings with U500 - changes suggested at last OV  are shown in bold (But she did not enter any of them): - basal rates: 12 am: 0.9 6 AM: 0.7  9 PM: 0.9 - ICR:   12 AM: 1: 258 PM: 1: 16 - target: 120-120  - ISF: 1:60 - Insulin on Board: 6 hours TDD from basal insulin: 70.9 units (57%) >> 64% >> 63% (17.2 x 5 units) TDD from bolus insulin: 54.1 units (43%) >> 36% >> 37% (10.3 x 5 units) Total daily dose: 150-170 units/day - extended bolusing: not using - changes infusion site: q1-2 days  She checks her sugars more than 4 times a day with her  Dexcom  CGM:   Previously:   Previously:   Lowest sugar was 139 >> 57 x1 >> LO; she has hypoglycemia awareness at 110.  Highest sugar was HI ...>> 300s >> 300s >> 400.  Glucometer: One Touch Verio  Pt's meals are: - Breakfast: usually skips, when eats b'fast: sausage bisquit - Lunch: salad or sandwich - Dinner: meat and 2 veggies - Snacks: 1-2: apples, oranges, occasionally chips  -+ CKD, last BUN/creatinine:  Lab Results  Component Value Date   BUN 14 09/15/2021   BUN 17 03/16/2021   CREATININE 0.93 09/15/2021   CREATININE 0.90 03/16/2021  She is not on ACE inhibitor/ARB. She had cough with lisinopril..  -+ HL; last set of lipids: Lab Results  Component Value Date   CHOL 153 07/22/2021   HDL 46.10 07/22/2021   LDLCALC 72 07/22/2021   LDLDIRECT 105.0 12/22/2019   TRIG 175.0 (H) 07/22/2021   CHOLHDL 3 07/22/2021  On Crestor 5 mg 3 times a week.  - last eye exam was in 01/20/2021: No DR. She has glaucoma - exams q6 mo.  -She has numbness and tingling in her toes. She also has muscle cramps in her feet.  Pt has FH of DM in mother, son, daughter, sister, MGM, MGF, M aunt and uncles.  She also has a history of HTN, fatty liver, GERD. She presented to the ED for nausea on 12/24/2019.  Per review of the labs, she appeared dehydrated, but no distinct pathology was found.  Of note, lipase was normal. She started Lopressor for HTN - but had dizziness >> stopped.  TSH was normal: Lab Results  Component Value Date   TSH 0.99 03/16/2021   ROS: + see HPI  I reviewed pt's medications, allergies, PMH, social hx, family hx, and changes were documented in the history of present illness. Otherwise, unchanged from my initial visit note.  Past Medical History:  Diagnosis Date   Allergy    Arthritis    hip, back, shoulders, joints   Asthma    Complication of anesthesia    problems with block during breast augmentation and epidural during childbirth   Depression    Diabetes mellitus  11/12   type 2   Family history of adverse reaction to anesthesia    PROBLEMS WITH BLOCKS   Fatty liver    GERD (gastroesophageal reflux disease)    Glaucoma suspect    Headache    migraines-4x a week headache-1x week migraine   History of hiatal hernia    Hyperlipidemia    Hypertension    Controlled on meds   Mitral valve prolapse    Nose colonized with MRSA 05/15/2018   Osteopenia    Pneumonia    PONV (postoperative nausea and vomiting)    Shingles 11/2006   Sleep disturbance    Tachycardia    Vertigo  Hx of   Past Surgical History:  Procedure Laterality Date   ABDOMINAL HYSTERECTOMY  2000   /BSO - abn. Paps   APPENDECTOMY  1978   AUGMENTATION MAMMAPLASTY Bilateral 1981   BLADDER REPAIR     x2   BREAST ENHANCEMENT SURGERY  1987   CARDIAC CATHETERIZATION     [facility]   CARDIAC CATHETERIZATION Left 12/18/2016   Procedure: Left Heart Cath and Coronary Angiography;  Surgeon: Wellington Hampshire, MD;  Location: [facility];  Service: Cardiovascular;  Laterality: Left;   CARPAL TUNNEL RELEASE Right    CERVICAL DISCECTOMY  11/2009   with synthetic discs inserted--  Dr Arnoldo Morale   COLONOSCOPY     JOINT REPLACEMENT     NM MYOVIEW LTD  03/2005   EF 56%, no ischemia   RECTAL PROLAPSE REPAIR     x1   SHOULDER ARTHROSCOPY WITH OPEN ROTATOR CUFF REPAIR Left 12/29/2019   Procedure: LEFT SHOULDER ARTHROSCOPIC DISTAL CLAVICLE EXCISISON, SUBACROMIAL DECOMPRESSION,BICEPS Amo ;  Surgeon: Lovell Sheehan, MD;  Location: [facility];  Service: Orthopedics;  Laterality: Left;   SHOULDER CLOSED REDUCTION Left 05/03/2020   Procedure: Left shoulder arthroscopic lysis of adhesions and manipulation under anesthesia;  Surgeon: Lovell Sheehan, MD;  Location: [facility];  Service: Orthopedics;  Laterality: Left;   SHOULDER SURGERY  09/2008, 01/2009   Right shoulder surgery, then redone with lysis of adhesions   TOTAL HIP ARTHROPLASTY Right 05/29/2018   Procedure: TOTAL HIP  ARTHROPLASTY ANTERIOR APPROACH;  Surgeon: Lovell Sheehan, MD;  Location: [facility];  Service: Orthopedics;  Laterality: Right;   UPPER GI ENDOSCOPY     Social History   Socioeconomic History   Marital status: Married    Spouse name: Not on file   Number of children: 5   Years of education: Not on file   Highest education level: Not on file  Occupational History   Occupation: homemaker  Tobacco Use   Smoking status: Never   Smokeless tobacco: Never  Vaping Use   Vaping Use: Never used  Substance and Sexual Activity   Alcohol use: No   Drug use: No   Sexual activity: Not on file  Other Topics Concern   Not on file  Social History Narrative   Home maker   Doesn't exercise      [name] sister         Social Determinants of Health   Financial Resource Strain: Not on file  Food Insecurity: Not on file  Transportation Needs: Not on file  Physical Activity: Not on file  Stress: Not on file  Social Connections: Not on file  Intimate Partner Violence: Not on file   Current Outpatient Medications on File Prior to Visit  Medication Sig Dispense Refill   aspirin-acetaminophen-caffeine (EXCEDRIN MIGRAINE) 250-250-65 MG tablet Take 1 tablet by mouth every 6 (six) hours as needed for headache.     Blood Glucose Monitoring Suppl (FIFTY50 GLUCOSE METER 2.0) w/Device KIT OneTouch Ultra2 Meter kit     cetirizine (ZYRTEC) 10 MG tablet Take 10 mg by mouth daily.      clobetasol (TEMOVATE) 0.05 % external solution Apply 1 application topically as directed. 1-2 gtts qd/bid aa scalp prn flares, avoid face, groin, axilla (Patient not taking: Reported on 09/16/2021) 50 mL 0   Continuous Blood Gluc Receiver ([place]) DEVI by Does not apply route.     Continuous Blood Gluc Sensor (DEXCOM G6 SENSOR) MISC CHANGE 1 SENSOR EVERY 10  DAYS AS DIRECTED 9 each 3   Continuous Blood Gluc Transmit (DEXCOM G6 TRANSMITTER) MISC 1 Device by Does not apply route every 3 (three) months. 1  each 3   docusate sodium (COLACE) 100 MG capsule Take 300 mg by mouth at bedtime.      DULoxetine (CYMBALTA) 30 MG capsule Take 1 capsule (30 mg total) by mouth daily. 90 capsule 3   empagliflozin (JARDIANCE) 10 MG TABS tablet Take 1 tablet (10 mg total) by mouth daily before breakfast. 90 tablet 2   fluticasone (FLONASE) 50 MCG/ACT nasal spray Place 1 spray into both nostrils daily.     furosemide (LASIX) 20 MG tablet Take 1 tablet (20 mg total) by mouth daily. 90 tablet 3   Glucagon 3 MG/DOSE POWD Place 3 mg into the nose once as needed for up to 1 dose. 1 each 11   HUMULIN R 500 UNIT/ML injection USE UP TO 200 UNITS OF CONCENTRATED INSULIN IN THE INSULIN PUMP DAILY 20 mL 11   Insulin Disposable Pump (OMNIPOD DASH PODS, GEN 4,) MISC Use 1 every day 18 each 3   LUMIGAN 0.01 % SOLN Place 1 drop into both eyes at bedtime.      metoprolol succinate (TOPROL-XL) 25 MG 24 hr tablet TAKE 1 AND 1/2 TABLETS BY  MOUTH DAILY 135 tablet 3   ondansetron (ZOFRAN ODT) 4 MG disintegrating tablet Take 1 tablet (4 mg total) by mouth every 6 (six) hours as needed for nausea or vomiting. 20 tablet 0   ONETOUCH DELICA LANCETS FINE MISC Check blood sugar once daily and as needed. 250.00 100 each 1   ONETOUCH ULTRA test strip USE TO CHECK BLOOD SUGAR 2  TIMES DAILY 200 strip 3   oxyCODONE-acetaminophen (PERCOCET) 5-325 MG tablet Take 1 tablet by mouth every 4 (four) hours as needed for severe pain. 14 tablet 0   pantoprazole (PROTONIX) 40 MG tablet TAKE 1 TABLET BY MOUTH  TWICE DAILY ON AN EMPTY  STOMACH 180 tablet 3   rosuvastatin (CRESTOR) 5 MG tablet TAKE 1 TABLET BY MOUTH 3  TIMES WEEKLY 39 tablet 3   sucralfate (CARAFATE) 1 g tablet Take 1 tablet (1 g total) by mouth in the morning, at noon, and at bedtime. 270 tablet 3   traZODone (DESYREL) 100 MG tablet TAKE 2 TABLETS BY MOUTH  EVERY NIGHT AT BEDTIME 180 tablet 3   triamterene-hydrochlorothiazide (MAXZIDE-25) 37.5-25 MG tablet TAKE 1 TABLET BY MOUTH ONCE DAILY  90 tablet 3   valACYclovir (VALTREX) 1000 MG tablet TAKE 2 TABLETS BY MOUTH  AT ONSET OF SYMPTOMS AS NEEDED FOR FEVER BLISTERS AND TAKE 2 TABLETS 12 HOURS LATER. 90 tablet 0   No current facility-administered medications on file prior to visit.   Allergies  Allergen Reactions   Humalog [Insulin Lispro] Swelling   Lisinopril Cough   Nitrofurantoin Other (See Comments)    Pt unsure   Oxycodone-Acetaminophen Itching   Percocet [Oxycodone-Acetaminophen]    Atorvastatin Diarrhea    May have caused colitis--but not clear cut   Clarithromycin Palpitations   Glipizide Other (See Comments)    Sugars too variable   Invokana [Canagliflozin]     Nausea, constipation   Family History  Problem Relation Age of Onset   Diabetes Mother    Heart disease Mother    Hypertension Mother    Ovarian cancer Mother    Melanoma Mother    Cancer Father    Coronary artery disease Father    Heart disease Father  Diabetes Sister    Depression Sister    Coronary artery disease Sister    Heart disease Sister    Rheum arthritis Maternal Aunt    Breast cancer Maternal Grandmother    Diabetes Maternal Grandmother    Heart attack Paternal Uncle    Heart attack Cousin    Heart attack Paternal Uncle    Diabetes Maternal Grandfather    Colon cancer Neg Hx     PE: BP 122/72 (BP Location: Right Arm, Patient Position: Sitting, Cuff Size: Normal)    Pulse (!) 103    Ht '5\' 4"'  (1.626 m)    Wt 164 lb 3.2 oz (74.5 kg)    SpO2 93%    BMI 28.18 kg/m  Wt Readings from Last 3 Encounters:  11/15/21 164 lb 3.2 oz (74.5 kg)  09/16/21 160 lb (72.6 kg)  09/15/21 159 lb (72.1 kg)   Constitutional: normal weight, in NAD Eyes: PERRLA, EOMI, no exophthalmos ENT: moist mucous membranes, no thyromegaly, no cervical lymphadenopathy, + palpable R>L parotid glands Cardiovascular: RRR, No MRG Respiratory: CTA B Musculoskeletal: no deformities, strength intact in all 4 Skin: moist, warm, no rashes Neurological: no tremor  with outstretched hands, DTR normal in all 4  ASSESSMENT: 1. DM2, insulin-dependent, uncontrolled, without long-term complications, but with significant hyperglycemia  2. HL  PLAN:  1. Patient with longstanding, uncontrolled, type 2 diabetes, prev. On basal-bolus insulin regimen with very high blood sugars, with slightly improved sugars after switching to an insulin pump earlier this year.  She had problems tolerating her Humalog and Lyumjev which were covered by her insurance and we gave her samples of NovoLog, which she could not tolerate.  Also, she has been on Ozempic, which we had to stop due to constant nausea.  Her sugars remain high while on NovoLog so we will switch to U500 insulin as sugars started to improve before last visit.  At that time, we added Jardiance low-dose.   We previously tried to add [place] at the suggestion of her nephrologist, but this was not covered by her insurance.  At this visit, she is on Jardiance, tolerated well.  She had a BMP after last OV and her GFR was stable, potassium level was normal. CGM interpretation: -At today's visit, we reviewed her CGM downloads: It appears that 38.3% of values are in target range (goal >70%), while 61.7% are higher than 180 (goal <25%), and 0% are lower than 70 (goal <4%).  The calculated average blood sugar is 201.  The projected HbA1c for the next 3 months (GMI) is 8.1%.. -Reviewing the CGM trends, it appears that her sugars are still high during the night and decreased with a nadir around 8-10 AM.  Upon questioning, she is eating a snack late at night which she is covering with insulin, but this appears to not be enough. Sugars increase precipitously after breakfast and they are more variable after lunch, some in the normal range, but some higher than 250.  After dinner and late night snacking, sugars are uniformly high. -At last visit, I suggested an increase in basal rate from 9 PM throughout the night until 6 AM, but this  changes were not entered into the pump.  Also, I suggested a strengthening of her insulin to carb ratios, but these changes were also not entered.  Upon questioning, patient tells me that her daughter-in-law entered the pump settings but it looks like the new settings were not saved.  At today's visit I had the patient  educator to be shown how to enter the settings into the pump.  Patient also mentioned that her Dexcom is not tolerating her at higher blood sugars and it appears that this was due to setting the high alarm to high, at 400.  I recommended to lower the alert threshold.  Her low alarm is set to 90 and we discussed that she may need to set this lower to avoid frequent alarms. -At this visit, I again suggested to strengthen insulin to carb ratios.  Also, reviewing the pump downloads, she introduces lower amount of carbs into the pump and I do not believe that these are completely accurate.  I advised her to try to enter more carbs whenever she is eating more. - We also discussed about increasing her basal rates at night but only until 3 AM and we also increase the basal rate during the day.  Her sugars are highest between 9 PM to 12 AM so I advised her to increase the basal rate even more around these times. -She did have a very low blood sugar (LO) after lunch x1.  It is unclear what happened at that time.  Since this was an isolated episode and she had 0% low blood sugars, I would not suggest a change in regimen based on this.  She has G-voke injectable glucagon at home.  Of note, Baqsimi was not approved for her. -I suggested: Patient Instructions  Please use the following pump settings: - basal rates: 12 am: 0.9 >> 1.0 3 am: 0.9  6 am: 0.7  9 am: 0.7 >> 0.8 9 PM: 0.9 >> 1.1 - ICR:   12 AM: 1: 25 >> 1:22 8 PM: 1: 16 >> 1:13 - target: 120-120  - ISF: 1:60 - Insulin on Board: 6 hours  Please continue: - Jardiance 10 mg before b'fast.  Please return in 3-4 months.  - we checked her  HbA1c: 8.2% (slightly higher) - advised to check sugars at different times of the day - 4x a day, rotating check times - advised for yearly eye exams >> she is UTD - return to clinic in 3-4 months  2. HL -Reviewed latest lipid panel from last visit: Much improved -LDL at goal, triglycerides only slightly high (lipids checked after coffee and creamer): Lab Results  Component Value Date   CHOL 153 07/22/2021   HDL 46.10 07/22/2021   LDLCALC 72 07/22/2021   LDLDIRECT 105.0 12/22/2019   TRIG 175.0 (H) 07/22/2021   CHOLHDL 3 07/22/2021  -She continues on Crestor 5 mg 3 times a week without side effects  Total time spent for the visit: 40 min, in reviewing her pump downloads, discussing her hypo- and hyper-glycemic episodes, reviewing previous labs and pump settings and developing a plan to avoid hypo- and hyper-glycemia.    Philemon Kingdom, MD PhD [facility] Endocrinology

## 2021-12-03 ENCOUNTER — Other Ambulatory Visit: Payer: Self-pay | Admitting: Internal Medicine

## 2021-12-03 DIAGNOSIS — R000 Tachycardia, unspecified: Secondary | ICD-10-CM

## 2021-12-26 ENCOUNTER — Telehealth: Payer: Self-pay | Admitting: Internal Medicine

## 2021-12-26 DIAGNOSIS — E1142 Type 2 diabetes mellitus with diabetic polyneuropathy: Secondary | ICD-10-CM

## 2021-12-26 DIAGNOSIS — E1165 Type 2 diabetes mellitus with hyperglycemia: Secondary | ICD-10-CM

## 2021-12-26 NOTE — Telephone Encounter (Signed)
Patient called concerning app for Dexcom not working. Patient spoke with Dexcom and found out that [employer] had changed the program and phone unable to provider BG readings. Dexcom will be sent a transmitter to the patient but this will not fix the issue with receiving the reading on her phone. Please advise as soon as possible to patient [phone]. Patient also mention that sensor has been bleeding and has left a bruise.

## 2021-12-28 MED ORDER — DEXCOM G6 RECEIVER DEVI: 0 refills | Status: DC

## 2021-12-28 NOTE — Telephone Encounter (Signed)
Rx sent to preferred pharmacy.

## 2021-12-28 NOTE — Telephone Encounter (Signed)
You will need to order her a Dexcom reader if her phone is now incompatable

## 2022-01-05 ENCOUNTER — Other Ambulatory Visit: Payer: Self-pay

## 2022-01-05 ENCOUNTER — Ambulatory Visit: Payer: 59 | Admitting: Internal Medicine

## 2022-01-05 ENCOUNTER — Encounter: Payer: Self-pay | Admitting: Internal Medicine

## 2022-01-05 DIAGNOSIS — L4050 Arthropathic psoriasis, unspecified: Secondary | ICD-10-CM | POA: Diagnosis not present

## 2022-01-05 MED ORDER — HYDROCODONE-ACETAMINOPHEN 5-325 MG PO TABS: 1.0000 | ORAL_TABLET | ORAL | 0 refills | Status: DC | PRN

## 2022-01-05 NOTE — Progress Notes (Signed)
Subjective:    Patient ID: Shannon [name], female    DOB: [birth year], 66 y.o.   MRN: [id number]  HPI Here due to problems with pain  Has pain in hands and back Left hip is hurting--going back to Dr Harlow Mares for that Right posterior heel hurting for 8 months  Has to stay with mom ---needs just about total care  Getting cimzia from Dr Posey Pronto for the psoriatic arthritis and ankylosing spondylitis Can't tolerate prednisone--for elevated sugars  Uses heat, diclofenac cream, other OTC Can't tolerate ibuprofen (allergic reaction) Tylenol no help Slight help meloxicam---so not taking Tramadol didn't work for her either  Current Outpatient Medications on File Prior to Visit  Medication Sig Dispense Refill   aspirin-acetaminophen-caffeine (EXCEDRIN MIGRAINE) 250-250-65 MG tablet Take 1 tablet by mouth every 6 (six) hours as needed for headache.     Blood Glucose Monitoring Suppl (FIFTY50 GLUCOSE METER 2.0) w/Device KIT OneTouch Ultra2 Meter kit     certolizumab pegol (CIMZIA) 2 X 200 MG/ML PSKT Inject into the skin.     cetirizine (ZYRTEC) 10 MG tablet Take 10 mg by mouth daily.      clobetasol (TEMOVATE) 0.05 % external solution Apply 1 application topically as directed. 1-2 gtts qd/bid aa scalp prn flares, avoid face, groin, axilla 50 mL 0   Continuous Blood Gluc Receiver (DEXCOM G6 RECEIVER) DEVI Use as instructed to check blood sugar 1 each 0   Continuous Blood Gluc Sensor (DEXCOM G6 SENSOR) MISC CHANGE 1 SENSOR EVERY 10  DAYS AS DIRECTED 9 each 3   Continuous Blood Gluc Transmit (DEXCOM G6 TRANSMITTER) MISC 1 Device by Does not apply route every 3 (three) months. 1 each 3   docusate sodium (COLACE) 100 MG capsule Take 300 mg by mouth at bedtime.      DULoxetine (CYMBALTA) 30 MG capsule Take 1 capsule (30 mg total) by mouth daily. 90 capsule 3   empagliflozin (JARDIANCE) 10 MG TABS tablet Take 1 tablet (10 mg total) by mouth daily before breakfast. 90 tablet 2   fluticasone (FLONASE) 50  MCG/ACT nasal spray Place 1 spray into both nostrils daily.     furosemide (LASIX) 20 MG tablet Take 1 tablet (20 mg total) by mouth daily. 90 tablet 3   Glucagon 3 MG/DOSE POWD Place 3 mg into the nose once as needed for up to 1 dose. 1 each 11   HUMULIN R 500 UNIT/ML injection USE UP TO 200 UNITS OF CONCENTRATED INSULIN IN THE INSULIN PUMP DAILY 20 mL 11   Insulin Disposable Pump (OMNIPOD DASH PODS, GEN 4,) MISC Use 1 every day 18 each 3   LUMIGAN 0.01 % SOLN Place 1 drop into both eyes at bedtime.      metoprolol succinate (TOPROL-XL) 25 MG 24 hr tablet TAKE 1 AND 1/2 TABLETS BY  MOUTH DAILY 135 tablet 3   ondansetron (ZOFRAN ODT) 4 MG disintegrating tablet Take 1 tablet (4 mg total) by mouth every 6 (six) hours as needed for nausea or vomiting. 20 tablet 0   ONETOUCH DELICA LANCETS FINE MISC Check blood sugar once daily and as needed. 250.00 100 each 1   ONETOUCH ULTRA test strip USE TO CHECK BLOOD SUGAR 2  TIMES DAILY 200 strip 3   pantoprazole (PROTONIX) 40 MG tablet TAKE 1 TABLET BY MOUTH  TWICE DAILY ON AN EMPTY  STOMACH 180 tablet 3   rosuvastatin (CRESTOR) 5 MG tablet TAKE 1 TABLET BY MOUTH 3  TIMES WEEKLY 39 tablet 3  sucralfate (CARAFATE) 1 g tablet Take 1 tablet (1 g total) by mouth in the morning, at noon, and at bedtime. 270 tablet 3   traZODone (DESYREL) 100 MG tablet TAKE 2 TABLETS BY MOUTH  EVERY NIGHT AT BEDTIME 180 tablet 3   triamterene-hydrochlorothiazide (MAXZIDE-25) 37.5-25 MG tablet TAKE 1 TABLET BY MOUTH ONCE DAILY 90 tablet 3   valACYclovir (VALTREX) 1000 MG tablet TAKE 2 TABLETS BY MOUTH  AT ONSET OF SYMPTOMS AS NEEDED FOR FEVER BLISTERS AND TAKE 2 TABLETS 12 HOURS LATER. 90 tablet 0   No current facility-administered medications on file prior to visit.    Allergies  Allergen Reactions   Humalog [Insulin Lispro] Swelling   Lisinopril Cough   Nitrofurantoin Other (See Comments)    Pt unsure   Oxycodone-Acetaminophen Itching   Percocet [Oxycodone-Acetaminophen]     Atorvastatin Diarrhea    May have caused colitis--but not clear cut   Clarithromycin Palpitations   Glipizide Other (See Comments)    Sugars too variable   Invokana [Canagliflozin]     Nausea, constipation    Past Medical History:  Diagnosis Date   Allergy    Arthritis    hip, back, shoulders, joints   Asthma    Complication of anesthesia    problems with block during breast augmentation and epidural during childbirth   Depression    Diabetes mellitus 11/12   type 2   Family history of adverse reaction to anesthesia    PROBLEMS WITH BLOCKS   Fatty liver    GERD (gastroesophageal reflux disease)    Glaucoma suspect    Headache    migraines-4x a week headache-1x week migraine   History of hiatal hernia    Hyperlipidemia    Hypertension    Controlled on meds   Mitral valve prolapse    Nose colonized with MRSA 05/15/2018   Osteopenia    Pneumonia    PONV (postoperative nausea and vomiting)    Shingles 11/2006   Sleep disturbance    Tachycardia    Vertigo    Hx of    Past Surgical History:  Procedure Laterality Date   ABDOMINAL HYSTERECTOMY  2000   /BSO - abn. Paps   APPENDECTOMY  1978   AUGMENTATION MAMMAPLASTY Bilateral 1981   BLADDER REPAIR     x2   BREAST ENHANCEMENT SURGERY  1987   CARDIAC CATHETERIZATION     [facility]   CARDIAC CATHETERIZATION Left 12/18/2016   Procedure: Left Heart Cath and Coronary Angiography;  Surgeon: Wellington Hampshire, MD;  Location: [facility];  Service: Cardiovascular;  Laterality: Left;   CARPAL TUNNEL RELEASE Right    CERVICAL DISCECTOMY  11/2009   with synthetic discs inserted--  Dr Arnoldo Morale   COLONOSCOPY     JOINT REPLACEMENT     NM MYOVIEW LTD  03/2005   EF 56%, no ischemia   RECTAL PROLAPSE REPAIR     x1   SHOULDER ARTHROSCOPY WITH OPEN ROTATOR CUFF REPAIR Left 12/29/2019   Procedure: LEFT SHOULDER ARTHROSCOPIC DISTAL CLAVICLE EXCISISON, SUBACROMIAL DECOMPRESSION,BICEPS [place] ;  Surgeon: Lovell Sheehan, MD;  Location: [facility];  Service: Orthopedics;  Laterality: Left;   SHOULDER CLOSED REDUCTION Left 05/03/2020   Procedure: Left shoulder arthroscopic lysis of adhesions and manipulation under anesthesia;  Surgeon: Lovell Sheehan, MD;  Location: [facility];  Service: Orthopedics;  Laterality: Left;   SHOULDER SURGERY  09/2008, 01/2009   Right shoulder surgery, then redone with lysis of adhesions  TOTAL HIP ARTHROPLASTY Right 05/29/2018   Procedure: TOTAL HIP ARTHROPLASTY ANTERIOR APPROACH;  Surgeon: Lovell Sheehan, MD;  Location: [facility];  Service: Orthopedics;  Laterality: Right;   UPPER GI ENDOSCOPY      Family History  Problem Relation Age of Onset   Diabetes Mother    Heart disease Mother    Hypertension Mother    Ovarian cancer Mother    Melanoma Mother    Cancer Father    Coronary artery disease Father    Heart disease Father    Diabetes Sister    Depression Sister    Coronary artery disease Sister    Heart disease Sister    Rheum arthritis Maternal Aunt    Breast cancer Maternal Grandmother    Diabetes Maternal Grandmother    Heart attack Paternal Uncle    Heart attack Cousin    Heart attack Paternal Uncle    Diabetes Maternal Grandfather    Colon cancer Neg Hx     Social History   Socioeconomic History   Marital status: Married    Spouse name: Not on file   Number of children: 5   Years of education: Not on file   Highest education level: Not on file  Occupational History   Occupation: homemaker  Tobacco Use   Smoking status: Never    Passive exposure: Past   Smokeless tobacco: Never  Vaping Use   Vaping Use: Never used  Substance and Sexual Activity   Alcohol use: No   Drug use: No   Sexual activity: Not on file  Other Topics Concern   Not on file  Social History Narrative   Home maker   Doesn't exercise      Judeen Hammans Talley's sister         Social Determinants of Health   Financial Resource Strain: Not on file  Food Insecurity: Not  on file  Transportation Needs: Not on file  Physical Activity: Not on file  Stress: Not on file  Social Connections: Not on file  Intimate Partner Violence: Not on file   Review of Systems Trouble sleeping---some help with melatonin Some depression -- "its a lot"---being away from husband taking care of mom     Objective:   Physical Exam Constitutional:      Appearance: Normal appearance.  Neurological:     Mental Status: She is alert.  Psychiatric:     Comments: Distressed, tearful           Assessment & Plan:

## 2022-01-05 NOTE — Assessment & Plan Note (Signed)
Doesn't seem to have responded to [employer] to work with DR Posey Pronto  A lot of this is caregiving stress Gave her "Caregivers Bill of Rights" and counseled Will try norco 5/325 for rare prn use

## 2022-01-09 ENCOUNTER — Other Ambulatory Visit: Payer: Self-pay

## 2022-01-09 ENCOUNTER — Ambulatory Visit: Payer: 59 | Admitting: Dermatology

## 2022-01-09 ENCOUNTER — Encounter: Payer: Self-pay | Admitting: Dermatology

## 2022-01-09 DIAGNOSIS — L820 Inflamed seborrheic keratosis: Secondary | ICD-10-CM

## 2022-01-09 DIAGNOSIS — D485 Neoplasm of uncertain behavior of skin: Secondary | ICD-10-CM

## 2022-01-09 DIAGNOSIS — L821 Other seborrheic keratosis: Secondary | ICD-10-CM

## 2022-01-09 DIAGNOSIS — L720 Epidermal cyst: Secondary | ICD-10-CM

## 2022-01-09 DIAGNOSIS — L4050 Arthropathic psoriasis, unspecified: Secondary | ICD-10-CM

## 2022-01-09 DIAGNOSIS — L409 Psoriasis, unspecified: Secondary | ICD-10-CM

## 2022-01-09 MED ORDER — CLOBETASOL PROPIONATE 0.05 % EX SOLN: 1.0000 "application " | CUTANEOUS | 2 refills | Status: DC

## 2022-01-09 MED ORDER — FLUOCINOLONE ACETONIDE 0.01 % OT OIL
TOPICAL_OIL | OTIC | 2 refills | Status: AC
Start: 2022-01-09 — End: ?

## 2022-01-09 NOTE — Progress Notes (Signed)
Follow-Up Visit   Subjective  Shannon [name] is a 66 y.o. female who presents for the following: Follow-up.  Patient presents for follow-up ISKs. Areas cleared after cryotherapy treatment, but she has new spots on her back and sides that are itchy and irritated. She also has a new growth on her right lower leg that has come up in the past couple of months, growing. She has psoriatic arthritis and uses clobetasol solution to the scalp. She also has new involvement of her elbows. She sees Dr Posey Pronto, rheumatologist, for PsA and AS and has started on Cimzia injections x 5-6 months, without much improvement.   The following portions of the chart were reviewed this encounter and updated as appropriate:       Review of Systems:  No other skin or systemic complaints except as noted in HPI or Assessment and Plan.  Objective  Well appearing patient in no apparent distress; mood and affect are within normal limits.  A focused examination was performed including trunk, legs, arms. Relevant physical exam findings are noted in the Assessment and Plan.  Scalp,ears, elbow Erythematous scaly plaque of the occipital scalp with crusted excoriations; pink scaly patch of the left elbow; pink scaliness of the ear canal; fingernails with pitts  R lower pretibia 1.0 cm pink scaly papule       Right Lower Back, Central Upper Abdomen Firm subcutaneous nodules, 1.0 cm.   R lower back x 2, L flank at bra line x 5, R lat breast x 2 (9) Erythematous stuck-on, waxy papule    Assessment & Plan  Psoriasis Scalp,ears, elbow  With associated arthritis. Patient sees rheumatologist, Dr Posey Pronto, and is currently on Cimzia injections, without much improvement. Recommend patient discuss Cosentyx or Taltz with rheumatologist.   Psoriasis is a chronic non-curable, but treatable genetic/hereditary disease that may have other systemic features affecting other organ systems such as joints (Psoriatic Arthritis). It  is associated with an increased risk of inflammatory bowel disease, heart disease, non-alcoholic fatty liver disease, and depression.    Continue clobetasol solution Apply qd/bid to AA scalp Start dermotic oil qd/bid to ear canals prn itchy rash  Fluocinolone Acetonide 0.01 % OIL - Scalp,ears, elbow Apply 1-2 gtts to ears once to twice daily as needed until improved.  clobetasol (TEMOVATE) 0.05 % external solution - Scalp,ears, elbow Apply 1 application topically as directed. 1-2 gtts qd/bid aa scalp prn flares, avoid face, groin, axilla  Neoplasm of uncertain behavior of skin R lower pretibia  Skin / nail biopsy Type of biopsy: tangential   Informed consent: discussed and consent obtained   Patient was prepped and draped in usual sterile fashion: Area prepped with alcohol. Anesthesia: the lesion was anesthetized in a standard fashion   Anesthetic:  1% lidocaine w/ epinephrine 1-100,000 buffered w/ 8.4% NaHCO3 Instrument used: flexible razor blade   Hemostasis achieved with: pressure, aluminum chloride and electrodesiccation   Outcome: patient tolerated procedure well    Destruction of lesion  Destruction method: electrodesiccation and curettage   Informed consent: discussed and consent obtained   Timeout:  patient name, date of birth, surgical site, and procedure verified Curettage performed in three different directions: Yes   Electrodesiccation performed over the curetted area: Yes   Final wound size (cm):  1 Hemostasis achieved with:  pressure, aluminum chloride and electrodesiccation Outcome: patient tolerated procedure well with no complications   Post-procedure details: wound care instructions given   Post-procedure details comment:  Ointment and bandage applied.  Specimen  1 - Surgical pathology Differential Diagnosis: Inflamed SK r/o SCC Check Margins: No 1.0 cm pink scaly papule EDC today  Epidermal inclusion cyst Right Lower Back, Central Upper  Abdomen  Benign-appearing. Exam most consistent with an epidermal inclusion cyst. Discussed that a cyst is a benign growth that can grow over time and sometimes get irritated or inflamed. Recommend observation if it is not bothersome. Discussed option of surgical excision to remove it if it is growing, symptomatic, or other changes noted. Please call for new or changing lesions so they can be evaluated.  Cyst with symptoms and/or recent change of the central upper abdomen.  Discussed surgical excision to remove, including resulting scar and possible recurrence.  Patient will schedule for surgery. Pre-op information given.    Inflamed seborrheic keratosis (9) R lower back x 2, L flank at bra line x 5, R lat breast x 2  Destruction of lesion - R lower back x 2, L flank at bra line x 5, R lat breast x 2  Destruction method: cryotherapy   Informed consent: discussed and consent obtained   Lesion destroyed using liquid nitrogen: Yes   Region frozen until ice ball extended beyond lesion: Yes   Outcome: patient tolerated procedure well with no complications   Post-procedure details: wound care instructions given   Additional details:  Prior to procedure, discussed risks of blister formation, small wound, skin dyspigmentation, or rare scar following cryotherapy. Recommend Vaseline ointment to treated areas while healing.   Seborrheic Keratoses - Stuck-on, waxy, tan-brown papules and/or plaques  - Benign-appearing - Discussed benign etiology and prognosis. - Observe - Call for any changes  Return in 6 months (on 07/09/2022) for f/u biopsy, also surgery for cyst excision central upper abdomen.  IJamesetta Orleans, CMA, am acting as scribe for Brendolyn Patty, MD .  Documentation: I have reviewed the above documentation for accuracy and completeness, and I agree with the above.  Brendolyn Patty MD

## 2022-01-09 NOTE — Patient Instructions (Addendum)
Wound Care Instructions  Cleanse wound gently with soap and water once a day then pat dry with clean gauze. Apply a thing coat of Petrolatum (petroleum jelly, "Vaseline") over the wound (unless you have an allergy to this). We recommend that you use a new, sterile tube of Vaseline. Do not pick or remove scabs. Do not remove the yellow or white "healing tissue" from the base of the wound.  Cover the wound with fresh, clean, nonstick gauze and secure with paper tape. You may use Band-Aids in place of gauze and tape if the would is small enough, but would recommend trimming much of the tape off as there is often too much. Sometimes Band-Aids can irritate the skin.  You should call the office for your biopsy report after 1 week if you have not already been contacted.  If you experience any problems, such as abnormal amounts of bleeding, swelling, significant bruising, significant pain, or evidence of infection, please call the office immediately.  FOR ADULT SURGERY PATIENTS: If you need something for pain relief you may take 1 extra strength Tylenol (acetaminophen) AND 2 Ibuprofen ('200mg'$  each) together every 4 hours as needed for pain. (do not take these if you are allergic to them or if you have a reason you should not take them.) Typically, you may only need pain medication for 1 to 3 days.      Pre-Operative Instructions  You are scheduled for a surgical procedure at [facility]. We recommend you read the following instructions. If you have any questions or concerns, please call the office at [phone].  Shower and wash the entire body with soap and water the day of your surgery paying special attention to cleansing at and around the planned surgery site.  Avoid aspirin or aspirin containing products at least fourteen (14) days prior to your surgical procedure and for at least one week (7 Days) after your surgical procedure. If you take aspirin on a regular basis for heart disease or  history of stroke or for any other reason, we may recommend you continue taking aspirin but please notify us if you take this on a regular basis. Aspirin can cause more bleeding to occur during surgery as well as prolonged bleeding and bruising after surgery.   Avoid other nonsteroidal pain medications at least one week prior to surgery and at least one week prior to your surgery. These include medications such as Ibuprofen (Motrin, Advil and Nuprin), Naprosyn, Voltaren, Relafen, etc. If medications are used for therapeutic reasons, please inform us as they can cause increased bleeding or prolonged bleeding during and bruising after surgical procedures.   Please advise [place] if you are taking any "blood thinner" medications such as Coumadin or Dipyridamole or Plavix or similar medications. These cause increased bleeding and prolonged bleeding during procedures and bruising after surgical procedures. We may have to consider discontinuing these medications briefly prior to and shortly after your surgery if safe to do so.   Please inform us of all medications you are currently taking. All medications that are taken regularly should be taken the day of surgery as you always do. Nevertheless, we need to be informed of what medications you are taking prior to surgery to know whether they will affect the procedure or cause any complications.   Please inform us of any medication allergies. Also inform us of whether you have allergies to Latex or rubber products or whether you have had any adverse reaction to Lidocaine or Epinephrine.  Please  inform us of any prosthetic or artificial body parts such as artificial heart valve, joint replacements, etc., or similar condition that might require preoperative antibiotics.   We recommend avoidance of alcohol at least two weeks prior to surgery and continued avoidance for at least two weeks after surgery.   We recommend discontinuation of tobacco smoking at least two  weeks prior to surgery and continued abstinence for at least two weeks after surgery.  Do not plan strenuous exercise, strenuous work or strenuous lifting for approximately four weeks after your surgery.   We request if you are unable to make your scheduled surgical appointment, please call us at least a week in advance or as soon as you are aware of a problem so that we can cancel or reschedule the appointment.   You MAY TAKE TYLENOL (acetaminophen) for pain as it is not a blood thinner.   PLEASE PLAN TO BE IN TOWN FOR TWO WEEKS FOLLOWING SURGERY, THIS IS IMPORTANT SO YOU CAN BE CHECKED FOR DRESSING CHANGES, SUTURE REMOVAL AND TO MONITOR FOR POSSIBLE COMPLICATIONS.   If You Need Anything After Your Visit  If you have any questions or concerns for your doctor, please call our main line at [phone] and press option 4 to reach your doctor's medical assistant. If no one answers, please leave a voicemail as directed and we will return your call as soon as possible. Messages left after 4 pm will be answered the following business day.   You may also send [place] a message via [place]. We typically respond to MyChart messages within 1-2 business days.  For prescription refills, please ask your pharmacy to contact our office. Our fax number is [phone].  If you have an urgent issue when the clinic is closed that cannot wait until the next business day, you can page your doctor at the number below.    Please note that while we do our best to be available for urgent issues outside of office hours, we are not available 24/7.   If you have an urgent issue and are unable to reach [place], you may choose to seek medical care at your doctor's office, retail clinic, urgent care center, or emergency room.  If you have a medical emergency, please immediately call 911 or go to the emergency department.  Pager Numbers  - Dr. Nehemiah Massed: [phone]  - Dr. Laurence Ferrari: [phone]  - Dr. Nicole Kindred:  [phone]  In the event of inclement weather, please call our main line at [phone] for an update on the status of any delays or closures.  Dermatology Medication Tips: Please keep the boxes that topical medications come in in order to help keep track of the instructions about where and how to use these. Pharmacies typically print the medication instructions only on the boxes and not directly on the medication tubes.   If your medication is too expensive, please contact our office at [phone] option 4 or send [place] a message through [place].   We are unable to tell what your co-pay for medications will be in advance as this is different depending on your insurance coverage. However, we may be able to find a substitute medication at lower cost or fill out paperwork to get insurance to cover a needed medication.   If a prior authorization is required to get your medication covered by your insurance company, please allow [place] 1-2 business days to complete this process.  Drug prices often vary depending on where the prescription is filled and some pharmacies  may offer cheaper prices.  The website www.goodrx.com contains coupons for medications through different pharmacies. The prices here do not account for what the cost may be with help from insurance (it may be cheaper with your insurance), but the website can give you the price if you did not use any insurance.  - You can print the associated coupon and take it with your prescription to the pharmacy.  - You may also stop by our office during regular business hours and pick up a GoodRx coupon card.  - If you need your prescription sent electronically to a different pharmacy, notify our office through [facility] or by phone at [phone] option 4.     Si Usted Necesita Algo Despus de Su Visita  Tambin puede enviarnos un mensaje a travs de Pharmacist, community. Por lo general respondemos a los mensajes de MyChart en el transcurso de 1 a 2  das hbiles.  Para renovar recetas, por favor pida a su farmacia que se ponga en contacto con nuestra oficina. Harland Dingwall de fax es [place] [phone].  Si tiene un asunto urgente cuando la clnica est cerrada y que no puede esperar hasta el siguiente da hbil, puede llamar/localizar a su doctor(a) al nmero que aparece a continuacin.   Por favor, tenga en cuenta que aunque hacemos todo lo posible para estar disponibles para asuntos urgentes fuera del horario de [place], no estamos disponibles las 24 horas del da, los 7 das de la [place].   Si tiene un problema urgente y no puede comunicarse con nosotros, puede optar por buscar atencin mdica  en el consultorio de su doctor(a), en una clnica privada, en un centro de atencin urgente o en una sala de emergencias.  Si tiene Engineering geologist, por favor llame inmediatamente al 911 o vaya a la sala de emergencias.  Nmeros de bper  - Dr. Nehemiah Massed: [phone]  - Dra. Moye: [phone]  - Dra. Nicole Kindred: [phone]  En caso de inclemencias del [place], por favor llame a Johnsie Kindred principal al [phone] para una actualizacin sobre el [place] de cualquier retraso o cierre.  Consejos para la medicacin en dermatologa: Por favor, guarde las cajas en las que vienen los medicamentos de uso tpico para ayudarle a seguir las instrucciones sobre dnde y cmo usarlos. Las farmacias generalmente imprimen las instrucciones del medicamento slo en las cajas y no directamente en los tubos del [place].   Si su medicamento es muy caro, por favor, pngase en contacto con Zigmund Daniel llamando al [phone] y presione la opcin 4 o envenos un mensaje a travs de Pharmacist, community.   No podemos decirle cul ser su copago por los medicamentos por adelantado ya que esto es diferente dependiendo de la cobertura de su seguro. Sin embargo, es posible que podamos encontrar un medicamento sustituto a Electrical engineer un formulario para que el  seguro cubra el medicamento que se considera necesario.   Si se requiere una autorizacin previa para que su compaa de seguros [place] su medicamento, por favor permtanos de 1 a 2 das hbiles para completar este proceso.  Los precios de los medicamentos varan con frecuencia dependiendo del Environmental consultant de dnde se surte la receta y alguna farmacias pueden ofrecer precios ms baratos.  El sitio web www.goodrx.com tiene cupones para medicamentos de Airline pilot. Los precios aqu no tienen en cuenta lo que podra costar con la ayuda del seguro (puede ser ms barato con su seguro), pero el sitio web puede darle el precio si no  utiliz ningn seguro.  - Puede imprimir el cupn correspondiente y llevarlo con su receta a la farmacia.  - Tambin puede pasar por nuestra oficina durante el horario de atencin regular y Charity fundraiser una tarjeta de cupones de GoodRx.  - Si necesita que su receta se enve electrnicamente a una farmacia diferente, informe a nuestra oficina a travs de MyChart de [place] o por telfono llamando al [phone] y presione la opcin 4.

## 2022-01-10 ENCOUNTER — Telehealth: Payer: Self-pay

## 2022-01-10 ENCOUNTER — Telehealth: Payer: Self-pay | Admitting: Pharmacy Technician

## 2022-01-10 ENCOUNTER — Other Ambulatory Visit (HOSPITAL_COMMUNITY): Payer: Self-pay

## 2022-01-10 NOTE — Telephone Encounter (Signed)
Advised pt of bx results/sh ?

## 2022-01-10 NOTE — Telephone Encounter (Signed)
-----   Message from Brendolyn Patty, MD sent at 01/10/2022  5:27 PM EST ----- Skin , right lower pretibia SEBORRHEIC KERATOSIS, IRRITATED, BASE INVOLVED  ISK, benign, already treated with [facility] - please call patient

## 2022-01-10 NOTE — Telephone Encounter (Signed)
Patient Advocate Encounter  Received notification from [employer] ([employer]) that prior authorization for [facility] G6 RECEIVER is required.   PA submitted on 2.21.23 RECEIVER Key [id number] Status is pending   [facility] will continue to follow  Luciano Cutter, CPhT Patient [facility] Phone: [phone] Fax:  [phone]

## 2022-01-13 NOTE — Telephone Encounter (Signed)
Received notification from [place] ([employer]) regarding a prior authorization for DEXCOM G6 RECEIVER. Authorization has been APPROVED from 2.21.23 to 221.24.    Authorization # [id number]

## 2022-01-16 ENCOUNTER — Encounter: Payer: 59 | Admitting: Internal Medicine

## 2022-01-30 ENCOUNTER — Encounter: Payer: 59 | Admitting: Dermatology

## 2022-02-16 ENCOUNTER — Other Ambulatory Visit: Payer: Self-pay | Admitting: Internal Medicine

## 2022-02-16 MED ORDER — HYDROCODONE-ACETAMINOPHEN 5-325 MG PO TABS: 1.0000 | ORAL_TABLET | ORAL | 0 refills | Status: DC | PRN

## 2022-02-16 NOTE — Telephone Encounter (Signed)
Is this okay to refill ? ?Last OV 01/05/22 ?Last Filled 01/05/22 qty # 30 ?Next OV 03/07/22 ?

## 2022-03-07 ENCOUNTER — Encounter: Payer: Self-pay | Admitting: Internal Medicine

## 2022-03-07 ENCOUNTER — Telehealth: Payer: Self-pay | Admitting: Internal Medicine

## 2022-03-07 ENCOUNTER — Ambulatory Visit (INDEPENDENT_AMBULATORY_CARE_PROVIDER_SITE_OTHER): Payer: 59 | Admitting: Internal Medicine

## 2022-03-07 DIAGNOSIS — Z794 Long term (current) use of insulin: Secondary | ICD-10-CM

## 2022-03-07 DIAGNOSIS — L723 Sebaceous cyst: Secondary | ICD-10-CM

## 2022-03-07 DIAGNOSIS — E1165 Type 2 diabetes mellitus with hyperglycemia: Secondary | ICD-10-CM

## 2022-03-07 DIAGNOSIS — N1831 Chronic kidney disease, stage 3a: Secondary | ICD-10-CM

## 2022-03-07 DIAGNOSIS — Z0000 Encounter for general adult medical examination without abnormal findings: Secondary | ICD-10-CM

## 2022-03-07 DIAGNOSIS — L089 Local infection of the skin and subcutaneous tissue, unspecified: Secondary | ICD-10-CM

## 2022-03-07 DIAGNOSIS — F39 Unspecified mood [affective] disorder: Secondary | ICD-10-CM

## 2022-03-07 DIAGNOSIS — L4050 Arthropathic psoriasis, unspecified: Secondary | ICD-10-CM

## 2022-03-07 LAB — HM DIABETES FOOT EXAM

## 2022-03-07 MED ORDER — CEPHALEXIN 500 MG PO CAPS: 500.0000 mg | ORAL_CAPSULE | Freq: Three times a day (TID) | ORAL | 1 refills | Status: DC

## 2022-03-07 NOTE — Assessment & Plan Note (Signed)
Depression related to stress/pain, etc ?Anxiety as well ?Doing better on the duloxetine '30mg'$ ---can increase if any worsening ?

## 2022-03-07 NOTE — Assessment & Plan Note (Signed)
Last GFR 50 ?Is on jardiance ?

## 2022-03-07 NOTE — Assessment & Plan Note (Signed)
Still trouble controlling but last A1c 8.2% ?Working with Dr Cruzita Lederer ?

## 2022-03-07 NOTE — Assessment & Plan Note (Signed)
Last colon 2021---not due for at least 7 years ?No pap due to hyster ?Mammogram due by September ?Hopefully can increase exercise with improvements in pain ?Bivalent COVID and flu vaccines in the fall ? ?

## 2022-03-07 NOTE — Progress Notes (Signed)
? ?Subjective:  ? ? Patient ID: Shannon [name], female    DOB: 19-Apr-1956, 66 y.o.   MRN: [id number] ? ?HPI ?Here for physical ? ?Doing some better ?Changed to cosentyx and doing ramp up---seems to be helping her pain ?Off cimzia ?Pain is down to 50% already ?Did use some hydrocodone--but hasn't needed laterly ? ?Now having some family help with your mom ?Considering hiring help ? ?Still struggling with diabetes ?Running high still ?Will follow up with Dr Cruzita Lederer later this month ? ?Mood is some better---as pain control improves ?"I'm not me yet--but better" ? ?Has inflamed cyst on chest ?Was able to extrude pus--but now has bust again ?Thinks she needs an antibiotic ? ?Current Outpatient Medications on File Prior to Visit  ?Medication Sig Dispense Refill  ? aspirin-acetaminophen-caffeine (EXCEDRIN MIGRAINE) 250-250-65 MG tablet Take 1 tablet by mouth every 6 (six) hours as needed for headache.    ? Blood Glucose Monitoring Suppl (FIFTY50 GLUCOSE METER 2.0) w/Device KIT OneTouch Ultra2 Meter kit    ? cetirizine (ZYRTEC) 10 MG tablet Take 10 mg by mouth daily.     ? clobetasol (TEMOVATE) 0.05 % external solution Apply 1 application topically as directed. 1-2 gtts qd/bid aa scalp prn flares, avoid face, groin, axilla 50 mL 2  ? Continuous Blood Gluc Receiver (DEXCOM G6 RECEIVER) DEVI Use as instructed to check blood sugar 1 each 0  ? Continuous Blood Gluc Sensor (DEXCOM G6 SENSOR) MISC CHANGE 1 SENSOR EVERY 10  DAYS AS DIRECTED 9 each 3  ? Continuous Blood Gluc Transmit (DEXCOM G6 TRANSMITTER) MISC 1 Device by Does not apply route every 3 (three) months. 1 each 3  ? docusate sodium (COLACE) 100 MG capsule Take 300 mg by mouth at bedtime.     ? DULoxetine (CYMBALTA) 30 MG capsule Take 1 capsule (30 mg total) by mouth daily. 90 capsule 3  ? empagliflozin (JARDIANCE) 10 MG TABS tablet Take 1 tablet (10 mg total) by mouth daily before breakfast. 90 tablet 2  ? Fluocinolone Acetonide 0.01 % OIL Apply 1-2 gtts to ears once  to twice daily as needed until improved. 20 mL 2  ? fluticasone (FLONASE) 50 MCG/ACT nasal spray Place 1 spray into both nostrils daily.    ? furosemide (LASIX) 20 MG tablet Take 1 tablet (20 mg total) by mouth daily. 90 tablet 3  ? Glucagon 3 MG/DOSE POWD Place 3 mg into the nose once as needed for up to 1 dose. 1 each 11  ? HUMULIN R 500 UNIT/ML injection USE UP TO 200 UNITS OF CONCENTRATED INSULIN IN THE INSULIN PUMP DAILY 20 mL 11  ? HYDROcodone-acetaminophen (NORCO/VICODIN) 5-325 MG tablet Take 1 tablet by mouth every 4 (four) hours as needed for moderate pain. 60 tablet 0  ? Insulin Disposable Pump (OMNIPOD DASH PODS, GEN 4,) MISC Use 1 every day 18 each 3  ? LUMIGAN 0.01 % SOLN Place 1 drop into both eyes at bedtime.     ? metoprolol succinate (TOPROL-XL) 25 MG 24 hr tablet TAKE 1 AND 1/2 TABLETS BY  MOUTH DAILY 135 tablet 3  ? ondansetron (ZOFRAN ODT) 4 MG disintegrating tablet Take 1 tablet (4 mg total) by mouth every 6 (six) hours as needed for nausea or vomiting. 20 tablet 0  ? ONETOUCH DELICA LANCETS FINE MISC Check blood sugar once daily and as needed. 250.00 100 each 1  ? ONETOUCH ULTRA test strip USE TO CHECK BLOOD SUGAR 2  TIMES DAILY 200 strip 3  ?  pantoprazole (PROTONIX) 40 MG tablet TAKE 1 TABLET BY MOUTH  TWICE DAILY ON AN EMPTY  STOMACH 180 tablet 3  ? rosuvastatin (CRESTOR) 5 MG tablet TAKE 1 TABLET BY MOUTH 3  TIMES WEEKLY 39 tablet 3  ? Secukinumab (COSENTYX ) Inject into the skin.    ? sucralfate (CARAFATE) 1 g tablet Take 1 tablet (1 g total) by mouth in the morning, at noon, and at bedtime. 270 tablet 3  ? traZODone (DESYREL) 100 MG tablet TAKE 2 TABLETS BY MOUTH  EVERY NIGHT AT BEDTIME 180 tablet 3  ? triamterene-hydrochlorothiazide (MAXZIDE-25) 37.5-25 MG tablet TAKE 1 TABLET BY MOUTH ONCE DAILY 90 tablet 3  ? valACYclovir (VALTREX) 1000 MG tablet TAKE 2 TABLETS BY MOUTH  AT ONSET OF SYMPTOMS AS NEEDED FOR FEVER BLISTERS AND TAKE 2 TABLETS 12 HOURS LATER. 90 tablet 0  ? ?No current  facility-administered medications on file prior to visit.  ? ? ?Allergies  ?Allergen Reactions  ? Humalog [Insulin Lispro] Swelling  ? Lisinopril Cough  ? Nitrofurantoin Other (See Comments)  ?  Pt unsure  ? Oxycodone-Acetaminophen Itching  ? Percocet [Oxycodone-Acetaminophen]   ? Atorvastatin Diarrhea  ?  May have caused colitis--but not clear cut  ? Clarithromycin Palpitations  ? Glipizide Other (See Comments)  ?  Sugars too variable  ? Invokana [Canagliflozin]   ?  Nausea, constipation  ? ? ?Past Medical History:  ?Diagnosis Date  ? Actinic keratosis   ? Allergy   ? Arthritis   ? hip, back, shoulders, joints  ? Asthma   ? Complication of anesthesia   ? problems with block during breast augmentation and epidural during childbirth  ? Depression   ? Diabetes mellitus 09/2011  ? type 2  ? Family history of adverse reaction to anesthesia   ? PROBLEMS WITH BLOCKS  ? Fatty liver   ? GERD (gastroesophageal reflux disease)   ? Glaucoma suspect   ? Headache   ? migraines-4x a week headache-1x week migraine  ? History of hiatal hernia   ? Hyperlipidemia   ? Hypertension   ? Controlled on meds  ? Mitral valve prolapse   ? 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 Osteopenia   ? Pneumonia   ? PONV (postoperative nausea and vomiting)   ? Psoriasis   ? Shingles 11/2006  ? Sleep disturbance   ? Tachycardia   ? Vertigo   ? Hx of  ? ? ?Past Surgical History:  ?Procedure Laterality Date  ? ABDOMINAL HYSTERECTOMY  2000  ? /BSO - abn. Paps  ? APPENDECTOMY  1978  ? AUGMENTATION MAMMAPLASTY Bilateral 1981  ? BLADDER REPAIR    ? x2  ? BREAST ENHANCEMENT SURGERY  1987  ? CARDIAC CATHETERIZATION    ? [place]  ? CARDIAC CATHETERIZATION Left 12/18/2016  ? Procedure: Left Heart Cath and Coronary Angiography;  Surgeon: Wellington Hampshire, MD;  Location: [facility];  Service: Cardiovascular;  Laterality: Left;  ? CARPAL TUNNEL RELEASE Right   ? CERVICAL DISCECTOMY  11/2009  ? with synthetic discs inserted--  Dr Arnoldo Morale  ? COLONOSCOPY    ? JOINT  REPLACEMENT    ? NM MYOVIEW LTD  03/2005  ? EF 56%, no ischemia  ? RECTAL PROLAPSE REPAIR    ? x1  ? SHOULDER ARTHROSCOPY WITH OPEN ROTATOR CUFF REPAIR Left 12/29/2019  ? Procedure: LEFT SHOULDER ARTHROSCOPIC DISTAL CLAVICLE EXCISISON, SUBACROMIAL DECOMPRESSION,BICEPS [place] ;  Surgeon: Lovell Sheehan, MD;  Location: [facility];  Service: Orthopedics;  Laterality: Left;  ? SHOULDER CLOSED REDUCTION Left 05/03/2020  ? Procedure: Left shoulder arthroscopic lysis of adhesions and manipulation under anesthesia;  Surgeon: Lovell Sheehan, MD;  Location: [facility];  Service: Orthopedics;  Laterality: Left;  ? SHOULDER SURGERY  09/2008, 01/2009  ? Right shoulder surgery, then redone with lysis of adhesions  ? TOTAL HIP ARTHROPLASTY Right 05/29/2018  ? Procedure: TOTAL HIP ARTHROPLASTY ANTERIOR APPROACH;  Surgeon: Lovell Sheehan, MD;  Location: [facility];  Service: Orthopedics;  Laterality: Right;  ? UPPER GI ENDOSCOPY    ? ? ?Family History  ?Problem Relation Age of Onset  ? Diabetes Mother   ? Heart disease Mother   ? Hypertension Mother   ? Ovarian cancer Mother   ? Melanoma Mother   ? Cancer Father   ? Coronary artery disease Father   ? Heart disease Father   ? Diabetes Sister   ? Depression Sister   ? Coronary artery disease Sister   ? Heart disease Sister   ? Pulmonary Hypertension Sister   ? Rheum arthritis Maternal Aunt   ? Heart attack Paternal Uncle   ? Heart attack Paternal Uncle   ? Breast cancer Maternal Grandmother   ? Diabetes Maternal Grandmother   ? Diabetes Maternal Grandfather   ? Heart attack Cousin   ? Colon cancer Neg Hx   ? ? ?Social History  ? ?Socioeconomic History  ? Marital status: Married  ?  Spouse name: Not on file  ? Number of children: 5  ? Years of education: Not on file  ? Highest education level: Not on file  ?Occupational History  ? Occupation: homemaker  ?Tobacco Use  ? Smoking status: Never  ?  Passive exposure: Past  ? Smokeless tobacco: Never  ?Vaping Use  ? Vaping  Use: Never used  ?Substance and Sexual Activity  ? Alcohol use: No  ? Drug use: No  ? Sexual activity: Not on file  ?Other Topics Concern  ? Not on file  ?Social History Narrative  ? Home maker  ? ?Social

## 2022-03-07 NOTE — Telephone Encounter (Signed)
?  Encourage patient to contact the pharmacy for refills or they can request refills through [facility] ? ?LAST APPOINTMENT DATE:  Please schedule appointment if longer than 1 year ? ?NEXT APPOINTMENT DATE: ? ?MEDICATION:ondansetron (ZOFRAN ODT) 4 MG disintegrating tablet ? ?Is the patient out of medication?  ? ?[place], [place] ? ?Let patient know to contact pharmacy at the end of the day to make sure medication is ready. ? ?Please notify patient to allow 48-72 hours to process ? ?CLINICAL FILLS OUT ALL BELOW:  ? ?LAST REFILL: ? ?QTY: ? ?REFILL DATE: ? ? ? ?OTHER COMMENTS:  ? ? ?Okay for refill? ? ?Please advise ? ? ?  ?

## 2022-03-07 NOTE — Assessment & Plan Note (Signed)
Doing better with the cosentyx ?

## 2022-03-07 NOTE — Assessment & Plan Note (Signed)
Will treat with cephalexin 500 tid for 5 days ?Would change to doxy if doesn't respond ?

## 2022-03-07 NOTE — Telephone Encounter (Signed)
Is this okay to refill? 

## 2022-03-08 MED ORDER — ONDANSETRON 4 MG PO TBDP: 4.0000 mg | ORAL_TABLET | Freq: Four times a day (QID) | ORAL | 3 refills | Status: DC | PRN

## 2022-03-16 ENCOUNTER — Ambulatory Visit: Payer: 59 | Admitting: Internal Medicine

## 2022-03-16 ENCOUNTER — Encounter: Payer: Self-pay | Admitting: Internal Medicine

## 2022-03-16 VITALS — BP 128/80 | HR 98 | Ht 64.0 in | Wt 162.0 lb

## 2022-03-16 DIAGNOSIS — E1165 Type 2 diabetes mellitus with hyperglycemia: Secondary | ICD-10-CM | POA: Diagnosis not present

## 2022-03-16 DIAGNOSIS — E1142 Type 2 diabetes mellitus with diabetic polyneuropathy: Secondary | ICD-10-CM

## 2022-03-16 DIAGNOSIS — E782 Mixed hyperlipidemia: Secondary | ICD-10-CM

## 2022-03-16 LAB — POCT GLYCOSYLATED HEMOGLOBIN (HGB A1C): Hemoglobin A1C: 8.5 % — AB (ref 4.0–5.6)

## 2022-03-16 NOTE — Patient Instructions (Addendum)
Please use the following pump settings: ?- basal rates: ?12 am: 1.0 ?3 am: 0.9  ?6 am: 0.7  ?9 am: 0.8 ?9 PM: 1.1 ?- ICR:  ? 12 AM: 1: 22 >> 1:17 ?8 PM: 1: 13 >> 1:9 ?- target: 120-120  ?- ISF: 1:60 ?- Active insulin time: 6 hours ? ?Please continue: ?- Jardiance 10 mg before b'fast. ? ?Please return in 3-4 months. ?

## 2022-03-16 NOTE — Progress Notes (Signed)
Patient ID: Shannon [name], female   DOB: 03/08/1956, 66 y.o.   MRN: [id number]  ? ?This visit occurred during the SARS-CoV-2 public health emergency.  Safety protocols were in place, including screening questions prior to the visit, additional usage of staff PPE, and extensive cleaning of exam room while observing appropriate contact time as indicated for disinfecting solutions.  ? ?HPI: ?[name] is a 66 y.o.-year-old female, initially referred by her PCP, Dr. Silvio Pate, returning for follow-up for DM2, dx in 2012, insulin-dependent since ~2019, uncontrolled, with complications (peripheral neuropathy). Last visit with me 4 months ago.   ? 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  She is not working.  She is not sleeping well and feels very stressed.   ?She has occasional nausea but no increased urination, nausea, chest pain. ?She has psoriatic arthritis and ankylosing spondylitis. She has significant joint pain - hands, back, hips.  She is on a biologic infusion (Cosentyx). ? ?Reviewed history: ?Patient described that her sugars worsened after she had to stop Metformin due to GI symptoms and was changed to sulfonylurea. They increased even further after steroid injection for Achilles tendinitis.  Her sugars were initially in the 300-400 range, but then increased to 500-HI (>600) >> she presented to the emergency room (11/12/2020).  Afterwards, she started to work on her diet >> she tells me that she was barely eating anything throughout the day. Sugars improved some, but they were still elevated. ? ?Insulin pump: ?-OmniPod Dash ? ?Insulin: ?-She has allergy to Humalog/Lyumjev >> significant irritation and inflammation at the infusion site ?-Switched to NovoLog samples since NovoLog was not covered by her insurance despite 2 preauthorization requests ?- U500 now ? ?CGM: ?-Dexcom G6 ? ?Supplies: ?-Investment banker, corporate for [employer] ? ?Reviewed HbA1c  levels: ?Lab Results  ?Component Value Date  ? HGBA1C 8.2 (A) 11/15/2021  ? HGBA1C 8.0 (A) 07/22/2021  ? HGBA1C 10.4 (A) 02/15/2021  ? HGBA1C 9.0 (A) 11/16/2020  ? HGBA1C 7.8 (A) 07/13/2020  ? HGBA1C 8.1 (H) 12/22/2019  ? HGBA1C 7.3 (A) 07/04/2019  ? HGBA1C 8.9 (H) 01/22/2019  ? HGBA1C 8.8 (H) 10/15/2018  ? HGBA1C 7.9 (H) 02/13/2018  ? HGBA1C 7.0 (H) 09/19/2017  ? HGBA1C 8.6 (H) 04/13/2017  ? HGBA1C 8.2 (H) 11/16/2016  ? HGBA1C 7.4 (H) 05/17/2016  ? HGBA1C 7.8 (H) 11/19/2015  ? HGBA1C 7.2 (H) 04/23/2015  ? HGBA1C 8.4 (H) 01/14/2015  ? HGBA1C 8.6 (H) 09/22/2014  ? HGBA1C 8.0 (H) 03/31/2014  ? HGBA1C 6.9 (H) 09/23/2013  ? HGBA1C 7.0 (H) 03/17/2013  ? HGBA1C 6.5 09/16/2012  ? HGBA1C 6.6 (H) 02/20/2012  ? HGBA1C 9.0 (H) 10/19/2011  ? ?Previously on: ?- Glimepiride 2 >> 4 mg before breakfast   ?- Onglyza 5 mg before breakfast ?- Lantus 30-35 units at bedtime ?She is intolerant to Metformin >> N/V/D >> had to stop and changed to Glimepiride. ? ?Previously on: ?- Ozempic 0.5 mg weekly >> stopped 2/2 nausea ?- Lantus 26 >> 34 >> Toujeo 46 units at bedtime ?- Lyumjev 6-10 >> 12-18 >> 15 >> 20 units before each meal ? ?Now on: ?- Farxiga 10 mg before breakfast- tried this in 03/2021 (not covered) >> restarted 07/2021 ?- Pump settings with U500 - changes suggested at last OV are shown in bold ?- basal rates: ?12 am: 0.9 >> 1.0 ?3 am: 0.9  ?6 am: 0.7  ?9 am: 0.7 >> 0.8 ?9  PM: 0.9 >> 1.1 ?- ICR:  ? 12 AM: 1: 25 >> 1:22 ?8 PM: 1: 16 >> 1:13 ?- target: 120-120  ?- ISF: 1:60 ?- Active insulin time: 6 hours ?TDD from basal insulin: 70.9 units (57%) >> 64% >> 63% >> 61% ?TDD from bolus insulin: 54.1 units (43%) >> 36% >> 37% >> 39% ?Total daily dose: 150-170 units/day ?- extended bolusing: not using ?- changes infusion site: q1-2 days ? ?She checks her sugars more than 4 times a day with her  Dexcom CGM: ? ? ?Previously: ? ? ?Previously: ? ? ?Lowest sugar was 57 x1 >> LO >> 50s; she has hypoglycemia awareness at 110.  ?Highest sugar  was HI ... >> 400 >> 400. ? ?Glucometer: One Touch Verio ? ?Pt's meals are: ?- Breakfast: usually skips, when eats b'fast: sausage bisquit ?- Lunch: salad or sandwich ?- Dinner: meat and 2 veggies ?- Snacks: 1-2: apples, oranges, occasionally chips ? ?-+ CKD, last BUN/creatinine:  ?Lab Results  ?Component Value Date  ? BUN 14 09/15/2021  ? BUN 17 03/16/2021  ? CREATININE 0.93 09/15/2021  ? CREATININE 0.90 03/16/2021  ?She is not on ACE inhibitor/ARB. She had cough with lisinopril. ? ?-+ HL; last set of lipids: ?Lab Results  ?Component Value Date  ? CHOL 153 07/22/2021  ? HDL 46.10 07/22/2021  ? [place] 72 07/22/2021  ? LDLDIRECT 105.0 12/22/2019  ? TRIG 175.0 (H) 07/22/2021  ? CHOLHDL 3 07/22/2021  ?On Crestor 5 mg 3 times a week. ? ?- last eye exam was in 2023: No DR reportedly. She has glaucoma - exams q6 mo. Her blurry vision improved (had laser surgery for glaucoma 05/2021).  ? ?-She has numbness and tingling in her toes. She also has muscle cramps in her feet.  Last foot exam 03/07/2022. ? ?Pt has FH of DM in mother, son, daughter, sister, MGM, MGF, M aunt and uncles. ? ?She also has a history of HTN, fatty liver, GERD. ?She presented to the ED for nausea on 12/24/2019.  Per review of the labs, she appeared dehydrated, but no distinct pathology was found.  Of note, lipase was normal. ?She started Lopressor for HTN - but had dizziness >> stopped. ? ?TSH was normal: ?Lab Results  ?Component Value Date  ? TSH 0.99 03/16/2021  ? ?ROS: ?+ see HPI ? ?I reviewed pt's medications, allergies, PMH, social hx, family hx, and changes were documented in the history of present illness. Otherwise, unchanged from my initial visit note. ? ?Past Medical History:  ?Diagnosis Date  ? Actinic keratosis   ? Allergy   ? Arthritis   ? hip, back, shoulders, joints  ? Asthma   ? Complication of anesthesia   ? problems with block during breast augmentation and epidural during childbirth  ? Depression   ? Diabetes mellitus 09/2011  ? type 2   ? Family history of adverse reaction to anesthesia   ? PROBLEMS WITH BLOCKS  ? Fatty liver   ? GERD (gastroesophageal reflux disease)   ? Glaucoma suspect   ? Headache   ? migraines-4x a week headache-1x week migraine  ? History of hiatal hernia   ? Hyperlipidemia   ? Hypertension   ? Controlled on meds  ? Mitral valve prolapse   ? 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 Osteopenia   ? Pneumonia   ? PONV (postoperative nausea and vomiting)   ? Psoriasis   ? Shingles 11/2006  ? Sleep disturbance   ? Tachycardia   ?  Vertigo   ? Hx of  ? ?Past Surgical History:  ?Procedure Laterality Date  ? ABDOMINAL HYSTERECTOMY  2000  ? /BSO - abn. Paps  ? APPENDECTOMY  1978  ? AUGMENTATION MAMMAPLASTY Bilateral 1981  ? BLADDER REPAIR    ? x2  ? BREAST ENHANCEMENT SURGERY  1987  ? CARDIAC CATHETERIZATION    ? [place]  ? CARDIAC CATHETERIZATION Left 12/18/2016  ? Procedure: Left Heart Cath and Coronary Angiography;  Surgeon: Wellington Hampshire, MD;  Location: [place] [place] [facility];  Service: Cardiovascular;  Laterality: Left;  ? CARPAL TUNNEL RELEASE Right   ? CERVICAL DISCECTOMY  11/2009  ? with synthetic discs inserted--  Dr Arnoldo Morale  ? COLONOSCOPY    ? JOINT REPLACEMENT    ? NM MYOVIEW LTD  03/2005  ? EF 56%, no ischemia  ? RECTAL PROLAPSE REPAIR    ? x1  ? SHOULDER ARTHROSCOPY WITH OPEN ROTATOR CUFF REPAIR Left 12/29/2019  ? Procedure: LEFT SHOULDER ARTHROSCOPIC DISTAL CLAVICLE EXCISISON, SUBACROMIAL DECOMPRESSION,BICEPS [place] ;  Surgeon: Lovell Sheehan, MD;  Location: [facility];  Service: Orthopedics;  Laterality: Left;  ? SHOULDER CLOSED REDUCTION Left 05/03/2020  ? Procedure: Left shoulder arthroscopic lysis of adhesions and manipulation under anesthesia;  Surgeon: Lovell Sheehan, MD;  Location: [facility];  Service: Orthopedics;  Laterality: Left;  ? SHOULDER SURGERY  09/2008, 01/2009  ? Right shoulder surgery, then redone with lysis of adhesions  ? TOTAL HIP ARTHROPLASTY Right 05/29/2018  ? Procedure: TOTAL HIP  ARTHROPLASTY ANTERIOR APPROACH;  Surgeon: Lovell Sheehan, MD;  Location: [facility];  Service: Orthopedics;  Laterality: Right;  ? UPPER GI ENDOSCOPY    ? ?Social History  ? ?Socioeconomic History  ? Marital status:

## 2022-03-29 ENCOUNTER — Encounter: Payer: Self-pay | Admitting: Internal Medicine

## 2022-03-29 ENCOUNTER — Ambulatory Visit: Payer: 59 | Admitting: Internal Medicine

## 2022-03-29 VITALS — BP 130/84 | HR 89 | Ht 64.0 in | Wt 167.0 lb

## 2022-03-29 DIAGNOSIS — I10 Essential (primary) hypertension: Secondary | ICD-10-CM | POA: Diagnosis not present

## 2022-03-29 DIAGNOSIS — Z79899 Other long term (current) drug therapy: Secondary | ICD-10-CM

## 2022-03-29 DIAGNOSIS — R079 Chest pain, unspecified: Secondary | ICD-10-CM | POA: Diagnosis not present

## 2022-03-29 DIAGNOSIS — I700 Atherosclerosis of aorta: Secondary | ICD-10-CM

## 2022-03-29 DIAGNOSIS — E785 Hyperlipidemia, unspecified: Secondary | ICD-10-CM

## 2022-03-29 DIAGNOSIS — E1169 Type 2 diabetes mellitus with other specified complication: Secondary | ICD-10-CM | POA: Diagnosis not present

## 2022-03-29 MED ORDER — ROSUVASTATIN CALCIUM 5 MG PO TABS: 5.0000 mg | ORAL_TABLET | Freq: Every day | ORAL | 3 refills | Status: DC

## 2022-03-29 NOTE — Progress Notes (Signed)
? ?Follow-up Outpatient Visit ?Date: 03/29/2022 ? ?Primary Care Provider: ?Venia Carbon, MD ?[place] ?[place] [address] ? ?Chief Complaint: Follow-up hypertension and chest pain ? ?HPI:  Shannon [name] is a 66 y.o. female with history of hypertension, hyperlipidemia, diabetes mellitus, mitral valve prolapse, and GERD, who presents for follow-up of hypertension and chest pain.  She was last seen in our office in 03/2020 by Laurann Montana, NP, at which time she was feeling well.  She was scheduled to undergo shoulder surgery.  Blood pressure was well controlled. ? ?Today, Shannon [name] reports that she is feeling fairly well.  She still notes rare episodes of chest tightness when she is particularly active, similar to what she has experienced for several years.  Overall, the frequency has decreased.  She denies shortness of breath, palpitations, and lightheadedness.  Leg edema is well controlled with low-dose furosemide.  She has gradually been increasing frequency of rosuvastatin dosing, which caused GI upset in the past.  She is tolerating rosuvastatin 5 mg daily, 6 days/week well.  She attributes her elevated blood pressure today to not yet having taken metoprolol today.  She notes that her blood pressures are typically better when she has seen other providers over the last year. ? ?-------------------------------------------------------------------------------------------------- ? ?Past Medical History:  ?Diagnosis Date  ? Actinic keratosis   ? Allergy   ? Arthritis   ? hip, back, shoulders, joints  ? Asthma   ? Complication of anesthesia   ? problems with block during breast augmentation and epidural during childbirth  ? Depression   ? Diabetes mellitus 09/2011  ? type 2  ? Family history of adverse reaction to anesthesia   ? PROBLEMS WITH BLOCKS  ? Fatty liver   ? GERD (gastroesophageal reflux disease)   ? Glaucoma suspect   ? Headache   ? migraines-4x a week headache-1x week migraine  ? History  of hiatal hernia   ? Hyperlipidemia   ? Hypertension   ? Controlled on meds  ? Mitral valve prolapse   ? 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 Osteopenia   ? Pneumonia   ? PONV (postoperative nausea and vomiting)   ? Psoriasis   ? Shingles 11/2006  ? Sleep disturbance   ? Tachycardia   ? Vertigo   ? Hx of  ? ?Past Surgical History:  ?Procedure Laterality Date  ? ABDOMINAL HYSTERECTOMY  2000  ? /BSO - abn. [place]aps  ? APPENDECTOMY  1978  ? AUGMENTATION MAMMAPLASTY Bilateral 1981  ? BLADDER REPAIR    ? x2  ? BREAST ENHANCEMENT SURGERY  1987  ? CARDIAC CATHETERIZATION    ? [place]  ? CARDIAC CATHETERIZATION Left 12/18/2016  ? Procedure: Left Heart Cath and Coronary Angiography;  Surgeon: Wellington Hampshire, MD;  Location: [place] [facility];  Service: Cardiovascular;  Laterality: Left;  ? CARPAL TUNNEL RELEASE Right   ? CERVICAL DISCECTOMY  11/2009  ? with synthetic discs inserted--  Dr Arnoldo Morale  ? COLONOSCOPY    ? JOINT REPLACEMENT    ? NM MYOVIEW LTD  03/2005  ? EF 56%, no ischemia  ? RECTAL PROLAPSE REPAIR    ? x1  ? SHOULDER ARTHROSCOPY WITH OPEN ROTATOR CUFF REPAIR Left 12/29/2019  ? Procedure: LEFT SHOULDER ARTHROSCOPIC DISTAL CLAVICLE EXCISISON, SUBACROMIAL DECOMPRESSION,BICEPS [place] ;  Surgeon: Lovell Sheehan, MD;  Location: [facility];  Service: Orthopedics;  Laterality: Left;  ? SHOULDER CLOSED REDUCTION Left 05/03/2020  ? Procedure: Left shoulder arthroscopic  lysis of adhesions and manipulation under anesthesia;  Surgeon: Lovell Sheehan, MD;  Location: [facility];  Service: Orthopedics;  Laterality: Left;  ? SHOULDER SURGERY  09/2008, 01/2009  ? Right shoulder surgery, then redone with lysis of adhesions  ? TOTAL HIP ARTHROPLASTY Right 05/29/2018  ? Procedure: TOTAL HIP ARTHROPLASTY ANTERIOR APPROACH;  Surgeon: Lovell Sheehan, MD;  Location: [facility];  Service: Orthopedics;  Laterality: Right;  ? UPPER GI ENDOSCOPY    ? ? ?Current Meds  ?Medication Sig  ? aspirin-acetaminophen-caffeine (EXCEDRIN  MIGRAINE) 250-250-65 MG tablet Take 1 tablet by mouth every 6 (six) hours as needed for headache.  ? Blood Glucose Monitoring Suppl (FIFTY50 GLUCOSE METER 2.0) w/Device KIT OneTouch Ultra2 Meter kit  ? cetirizine (ZYRTEC) 10 MG tablet Take 10 mg by mouth daily.   ? clobetasol (TEMOVATE) 0.05 % external solution Apply 1 application topically as directed. 1-2 gtts qd/bid aa scalp prn flares, avoid face, groin, axilla  ? Continuous Blood Gluc Receiver (DEXCOM G6 RECEIVER) DEVI Use as instructed to check blood sugar  ? Continuous Blood Gluc Sensor (DEXCOM G6 SENSOR) MISC CHANGE 1 SENSOR EVERY 10  DAYS AS DIRECTED  ? Continuous Blood Gluc Transmit (DEXCOM G6 TRANSMITTER) MISC 1 Device by Does not apply route every 3 (three) months.  ? COSENTYX SENSOREADY, 300 MG, 150 MG/ML SOAJ Inject 300 mg into the skin every 30 (thirty) days.  ? docusate sodium (COLACE) 100 MG capsule Take 300 mg by mouth at bedtime.   ? DULoxetine (CYMBALTA) 30 MG capsule Take 1 capsule (30 mg total) by mouth daily.  ? empagliflozin (JARDIANCE) 10 MG TABS tablet Take 1 tablet (10 mg total) by mouth daily before breakfast.  ? Fluocinolone Acetonide 0.01 % OIL Apply 1-2 gtts to ears once to twice daily as needed until improved.  ? fluticasone (FLONASE) 50 MCG/ACT nasal spray Place 1 spray into both nostrils daily.  ? furosemide (LASIX) 20 MG tablet Take 1 tablet (20 mg total) by mouth daily.  ? Glucagon 3 MG/DOSE POWD Place 3 mg into the nose once as needed for up to 1 dose.  ? HUMULIN R 500 UNIT/ML injection USE UP TO 200 UNITS OF CONCENTRATED INSULIN IN THE INSULIN PUMP DAILY  ? HYDROcodone-acetaminophen (NORCO/VICODIN) 5-325 MG tablet Take 1 tablet by mouth every 4 (four) hours as needed for moderate pain.  ? Insulin Disposable Pump (OMNIPOD DASH PODS, GEN 4,) MISC Use 1 every day  ? LUMIGAN 0.01 % SOLN Place 1 drop into both eyes at bedtime.   ? metoprolol succinate (TOPROL-XL) 25 MG 24 hr tablet TAKE 1 AND 1/2 TABLETS BY  MOUTH DAILY  ?  ondansetron (ZOFRAN ODT) 4 MG disintegrating tablet Take 1 tablet (4 mg total) by mouth every 6 (six) hours as needed for nausea or vomiting.  ? ONETOUCH DELICA LANCETS FINE MISC Check blood sugar once daily and as needed. 250.00  ? ONETOUCH ULTRA test strip USE TO CHECK BLOOD SUGAR 2  TIMES DAILY  ? pantoprazole (PROTONIX) 40 MG tablet TAKE 1 TABLET BY MOUTH  TWICE DAILY ON AN EMPTY  STOMACH  ? Secukinumab (COSENTYX [place]) Inject into the skin.  ? sucralfate (CARAFATE) 1 g tablet Take 1 tablet (1 g total) by mouth in the morning, at noon, and at bedtime.  ? tiZANidine (ZANAFLEX) 4 MG tablet Take 4 mg by mouth at bedtime.  ? traZODone (DESYREL) 100 MG tablet TAKE 2 TABLETS BY MOUTH  EVERY NIGHT AT BEDTIME  ? triamterene-hydrochlorothiazide (MAXZIDE-25) 37.5-25 MG  tablet TAKE 1 TABLET BY MOUTH ONCE DAILY  ? valACYclovir (VALTREX) 1000 MG tablet TAKE 2 TABLETS BY MOUTH  AT ONSET OF SYMPTOMS AS NEEDED FOR FEVER BLISTERS AND TAKE 2 TABLETS 12 HOURS LATER.  ? [DISCONTINUED] rosuvastatin (CRESTOR) 5 MG tablet TAKE 1 TABLET BY MOUTH 3  TIMES WEEKLY  ? ? ?Allergies: Humalog [insulin lispro], Lisinopril, Nitrofurantoin, Oxycodone-acetaminophen, Percocet [oxycodone-acetaminophen], Atorvastatin, Clarithromycin, Glipizide, and Invokana [canagliflozin] ? ?Social History  ? ?Tobacco Use  ? Smoking status: Never  ?  Passive exposure: Past  ? Smokeless tobacco: Never  ?Vaping Use  ? Vaping Use: Never used  ?Substance Use Topics  ? Alcohol use: No  ? Drug use: No  ? ? ?Family History  ?Problem Relation Age of Onset  ? Diabetes Mother   ? Heart disease Mother   ? Hypertension Mother   ? Ovarian cancer Mother   ? Melanoma Mother   ? Cancer Father   ? Coronary artery disease Father   ? Heart disease Father   ? Diabetes Sister   ? Depression Sister   ? Coronary artery disease Sister   ? Heart disease Sister   ? Pulmonary Hypertension Sister   ? Rheum arthritis Maternal Aunt   ? Heart attack Paternal Uncle   ? Heart attack Paternal Uncle    ? Breast cancer Maternal Grandmother   ? Diabetes Maternal Grandmother   ? Diabetes Maternal Grandfather   ? Heart attack Cousin   ? Colon cancer Neg Hx   ? ? ?Review of Systems: ?A 12-system review of systems was

## 2022-03-29 NOTE — Patient Instructions (Signed)
Medication Instructions:  ? ?Your physician has recommended you make the following change in your medication:  ? ?INCREASE Rosuvastatin 5 mg daily  ? ?*If you need a refill on your cardiac medications before your next appointment, please call your pharmacy* ? ? ?Lab Work: ? ?Your physician recommends that you return for FASTING lab work in [place] (at the medical mall) ? ?Lipid panel / ALT ? ?We will contact you to remind you of lab work and location in 3 months ? ?Testing/Procedures: ? ?None ordered ? ? ?Follow-Up: ?At [facility], you and your health needs are our priority.  As part of our continuing mission to provide you with exceptional heart care, we have created designated Provider Care Teams.  These Care Teams include your primary Cardiologist (physician) and Advanced Practice Providers (APPs -  Physician Assistants and Nurse Practitioners) who all work together to provide you with the care you need, when you need it. ? ?We recommend signing up for the patient portal called "MyChart".  Sign up information is provided on this After Visit Summary.  MyChart is used to connect with patients for Virtual Visits (Telemedicine).  Patients are able to view lab/test results, encounter notes, upcoming appointments, etc.  Non-urgent messages can be sent to your provider as well.   ?To learn more about what you can do with MyChart, go to [email or web address].   ? ?Your next appointment:   ?1 year(s) ? ?The format for your next appointment:   ?In Person ? ?Provider:   ?You may see Nelva Bush, MD or one of the following Advanced Practice Providers on your designated Care Team:   ?Murray Hodgkins, NP ?Christell Faith, PA-C ?Cadence Kathlen Mody, PA-C{ ? ? ?Important Information About Sugar ? ? ? ? ? ? ?

## 2022-04-03 ENCOUNTER — Ambulatory Visit: Payer: 59 | Admitting: Dermatology

## 2022-04-03 DIAGNOSIS — L409 Psoriasis, unspecified: Secondary | ICD-10-CM | POA: Diagnosis not present

## 2022-04-03 DIAGNOSIS — D485 Neoplasm of uncertain behavior of skin: Secondary | ICD-10-CM

## 2022-04-03 DIAGNOSIS — L720 Epidermal cyst: Secondary | ICD-10-CM

## 2022-04-03 DIAGNOSIS — L4050 Arthropathic psoriasis, unspecified: Secondary | ICD-10-CM

## 2022-04-03 NOTE — Patient Instructions (Signed)

## 2022-04-03 NOTE — Progress Notes (Signed)
? ?Follow-Up Visit ?  ?Subjective  ?Shannon [name] is a 66 y.o. female who presents for the following: Cyst (Central upper abdomen. Patient presents for excision. Cyst was inflamed about 3-4 weeks ago and her primary dr put her on antibiotics. Cyst has improved and has gone back down to its original size. ). She also has itchy rash around her nose.  She recently switched from Cimzia to Cosentyx for PsA which works better, but doesn't hold her the entire month before joint pain recurs. ? ? ?The following portions of the chart were reviewed this encounter and updated as appropriate:  ?  ?  ? ?Review of Systems:  No other skin or systemic complaints except as noted in HPI or Assessment and Plan. ? ?Objective  ?Well appearing patient in no apparent distress; mood and affect are within normal limits. ? ?A focused examination was performed including face, abdomen. Relevant physical exam findings are noted in the Assessment and Plan. ? ?central upper abdomen ?Firm sub q nodule with surrounding erythema 1.0 cm ? ?Bil Alar Crease ?Scaly erythema ? ? ? ?Assessment & Plan  ?Neoplasm of uncertain behavior of skin ?central upper abdomen ? ?Skin excision ? ?Lesion length (cm):  1 ?Lesion width (cm):  1 ?Margin per side (cm):  0.1 ?Total excision diameter (cm):  1.2 ?Informed consent: discussed and consent obtained   ?Timeout: patient name, date of birth, surgical site, and procedure verified   ?Procedure prep:  Patient was prepped and draped in usual sterile fashion ?Prep type:  Povidone-iodine ?Anesthesia: the lesion was anesthetized in a standard fashion   ?Anesthesia comment:  Total 12cc - 6cc lido w/epi, 6cc bupivicaine ?Anesthetic:  1% lidocaine w/ epinephrine 1-100,000 buffered w/ 8.4% NaHCO3 (0.5% bupivicaine) ?Instrument used comment:  #15c Blade ?Hemostasis achieved with: pressure and electrodesiccation   ?Outcome: patient tolerated procedure well with no complications   ? ?Skin repair ?Complexity:  Intermediate ?Final  length (cm):  2 ?Informed consent: discussed and consent obtained   ?Timeout: patient name, date of birth, surgical site, and procedure verified   ?Reason for type of repair: reduce tension to allow closure, reduce the risk of dehiscence, infection, and necrosis, reduce subcutaneous dead space and avoid a hematoma, preserve normal anatomical and functional relationships and enhance both functionality and cosmetic results   ?Undermining: edges undermined   ?Subcutaneous layers (deep stitches):  ?Suture size:  4-0 ?Suture type: Vicryl (polyglactin 910)   ?Stitches:  Buried vertical mattress ?Fine/surface layer approximation (top stitches):  ?Suture size:  4-0 ?Suture type: nylon   ?Stitches: simple interrupted   ?Suture removal (days):  7 ?Hemostasis achieved with: suture ?Outcome: patient tolerated procedure well with no complications   ?Post-procedure details: sterile dressing applied and wound care instructions given   ?Dressing type: pressure dressing (mupirocin)   ? ?Specimen 1 - Surgical pathology ?Differential Diagnosis: Cyst vs other ?Check Margins: No ?Firm sub q nodule with surrounding erythema 1.0 cm ? ?Psoriasis ?Bil Alar Crease ? ?pt has PsA on Cosentyx per rheumatology ? ?Psoriasis is a chronic non-curable, but treatable genetic/hereditary disease that may have other systemic features affecting other organ systems such as joints (Psoriatic Arthritis). It is associated with an increased risk of inflammatory bowel disease, heart disease, non-alcoholic fatty liver disease, and depression.   ? ?Samples x 2 of Vtama Cream given to patient. Apply qd to AA. ? ?Recheck on f/u. If effective, will send in Rx. ? ?Related Medications ?Fluocinolone Acetonide 0.01 % OIL ?Apply 1-2 gtts to ears  once to twice daily as needed until improved. ? ?clobetasol (TEMOVATE) 0.05 % external solution ?Apply 1 application topically as directed. 1-2 gtts qd/bid aa scalp prn flares, avoid face, groin, axilla ? ? ?Return in about 1  week (around 04/10/2022) for suture removal. ? ?I, Jamesetta Orleans, CMA, am acting as scribe for Brendolyn Patty, MD . ? ?Documentation: I have reviewed the above documentation for accuracy and completeness, and I agree with the above. ? ?Brendolyn Patty MD  ?

## 2022-04-04 ENCOUNTER — Telehealth: Payer: Self-pay

## 2022-04-04 NOTE — Telephone Encounter (Signed)
Talked to patient and she is doing ok from surgery yesterday.  

## 2022-04-10 ENCOUNTER — Ambulatory Visit (INDEPENDENT_AMBULATORY_CARE_PROVIDER_SITE_OTHER): Payer: 59 | Admitting: Dermatology

## 2022-04-10 DIAGNOSIS — L409 Psoriasis, unspecified: Secondary | ICD-10-CM

## 2022-04-10 DIAGNOSIS — L720 Epidermal cyst: Secondary | ICD-10-CM

## 2022-04-10 MED ORDER — VTAMA 1 % EX CREA: TOPICAL_CREAM | CUTANEOUS | 1 refills | Status: DC

## 2022-04-10 NOTE — Patient Instructions (Addendum)
[place], [place]  [address] [place] [address]  Phone:  [phone]  Fax:  [phone]     If You Need Anything After Your Visit  If you have any questions or concerns for your doctor, please call our main line at [phone] and press option 4 to reach your doctor's medical assistant. If no one answers, please leave a voicemail as directed and we will return your call as soon as possible. Messages left after 4 pm will be answered the following business day.   You may also send [place] a message via [place]. We typically respond to MyChart messages within 1-2 business days.  For prescription refills, please ask your pharmacy to contact our office. Our fax number is [phone].  If you have an urgent issue when the clinic is closed that cannot wait until the next business day, you can page your doctor at the number below.    Please note that while we do our best to be available for urgent issues outside of office hours, we are not available 24/7.   If you have an urgent issue and are unable to reach [place], you may choose to seek medical care at your doctor's office, retail clinic, urgent care center, or emergency room.  If you have a medical emergency, please immediately call 911 or go to the emergency department.  Pager Numbers  - Dr. Nehemiah Massed: [phone]  - Dr. Laurence Ferrari: [phone]  - Dr. Nicole Kindred: [phone]  In the event of inclement weather, please call our main line at [phone] for an update on the status of any delays or closures.  Dermatology Medication Tips: Please keep the boxes that topical medications come in in order to help keep track of the instructions about where and how to use these. Pharmacies typically print the medication instructions only on the boxes and not directly on the medication tubes.   If your medication is too expensive, please contact our office at [phone]  option 4 or send [place] a message through [place].   We are unable to tell what your co-pay for medications will be in advance as this is different depending on your insurance coverage. However, we may be able to find a substitute medication at lower cost or fill out paperwork to get insurance to cover a needed medication.   If a prior authorization is required to get your medication covered by your insurance company, please allow [place] 1-2 business days to complete this process.  Drug prices often vary depending on where the prescription is filled and some pharmacies may offer cheaper prices.  The website www.goodrx.com contains coupons for medications through different pharmacies. The prices here do not account for what the cost may be with help from insurance (it may be cheaper with your insurance), but the website can give you the price if you did not use any insurance.  - You can print the associated coupon and take it with your prescription to the pharmacy.  - You may also stop by our office during regular business hours and pick up a GoodRx coupon card.  - If you need your prescription sent electronically to a different pharmacy, notify our office through [facility] or by phone at [phone] option 4.     Si Usted Necesita Algo Despus de Su Visita  Tambin puede enviarnos un mensaje a travs de Pharmacist, community. Por lo general respondemos a los mensajes  de MyChart en el transcurso de 1 a 2 das hbiles.  Para renovar recetas, por favor pida a su farmacia que se ponga en contacto con nuestra oficina. Harland Dingwall de fax es [place] [phone].  Si tiene un asunto urgente cuando la clnica est cerrada y que no puede esperar hasta el siguiente da hbil, puede llamar/localizar a su doctor(a) al nmero que aparece a continuacin.   Por favor, tenga en cuenta que aunque hacemos todo lo posible para estar disponibles para asuntos urgentes fuera del horario de [place], no estamos disponibles las  24 horas del da, los 7 das de la [place].   Si tiene un problema urgente y no puede comunicarse con nosotros, puede optar por buscar atencin mdica  en el consultorio de su doctor(a), en una clnica privada, en un centro de atencin urgente o en una sala de emergencias.  Si tiene Engineering geologist, por favor llame inmediatamente al 911 o vaya a la sala de emergencias.  Nmeros de bper  - Dr. Nehemiah Massed: [phone]  - Dra. Moye: [phone]  - Dra. Nicole Kindred: [phone]  En caso de inclemencias del [place], por favor llame a Johnsie Kindred principal al [phone] para una actualizacin sobre el [place] de cualquier retraso o cierre.  Consejos para la medicacin en dermatologa: Por favor, guarde las cajas en las que vienen los medicamentos de uso tpico para ayudarle a seguir las instrucciones sobre dnde y cmo usarlos. Las farmacias generalmente imprimen las instrucciones del medicamento slo en las cajas y no directamente en los tubos del [place].   Si su medicamento es muy caro, por favor, pngase en contacto con Zigmund Daniel llamando al [phone] y presione la opcin 4 o envenos un mensaje a travs de Pharmacist, community.   No podemos decirle cul ser su copago por los medicamentos por adelantado ya que esto es diferente dependiendo de la cobertura de su seguro. Sin embargo, es posible que podamos encontrar un medicamento sustituto a Electrical engineer un formulario para que el seguro cubra el medicamento que se considera necesario.   Si se requiere una autorizacin previa para que su compaa de seguros [place] su medicamento, por favor permtanos de 1 a 2 das hbiles para completar este proceso.  Los precios de los medicamentos varan con frecuencia dependiendo del Environmental consultant de dnde se surte la receta y alguna farmacias pueden ofrecer precios ms baratos.  El sitio web www.goodrx.com tiene cupones para medicamentos de Airline pilot. Los precios aqu no tienen en cuenta lo  que podra costar con la ayuda del seguro (puede ser ms barato con su seguro), pero el sitio web puede darle el precio si no utiliz Research scientist (physical sciences).  - Puede imprimir el cupn correspondiente y llevarlo con su receta a la farmacia.  - Tambin puede pasar por nuestra oficina durante el horario de atencin regular y Charity fundraiser una tarjeta de cupones de GoodRx.  - Si necesita que su receta se enve electrnicamente a una farmacia diferente, informe a nuestra oficina a travs de MyChart de [place] o por telfono llamando al [phone] y presione la opcin 4.

## 2022-04-10 NOTE — Progress Notes (Signed)
   Follow-Up Visit   Subjective  Shannon [name] is a 66 y.o. female who presents for the following: Suture / Staple Removal (Patient here today for suture removal at upper central abdomen following excision of proven Epidermal cyst /) and Psoriasis (F/u on psoriasis on her nose, treating with Vtama cream samples with a good response).   The following portions of the chart were reviewed this encounter and updated as appropriate:       Review of Systems:  No other skin or systemic complaints except as noted in HPI or Assessment and Plan.  Objective  Well appearing patient in no apparent distress; mood and affect are within normal limits.  A focused examination was performed including upper central abdomen, face. Relevant physical exam findings are noted in the Assessment and Plan.  bil alar crease Scaly erythema   central upper abdomen Well healed scar   Right Lower Back Subcutaneous nodule.     Assessment & Plan  Psoriasis bil alar crease  pt has PsA on Cosentyx per rheumatology   Psoriasis is a chronic non-curable, but treatable genetic/hereditary disease that may have other systemic features affecting other organ systems such as joints (Psoriatic Arthritis). It is associated with an increased risk of inflammatory bowel disease, heart disease, non-alcoholic fatty liver disease, and depression.    Start Vtama Cream Apply qd to AA. Rx sent to [facility]     Related Medications Fluocinolone Acetonide 0.01 % OIL Apply 1-2 gtts to ears once to twice daily as needed until improved.  clobetasol (TEMOVATE) 0.05 % external solution Apply 1 application topically as directed. 1-2 gtts qd/bid aa scalp prn flares, avoid face, groin, axilla  Epidermal inclusion cyst central upper abdomen  Biopsy results discussed  Encounter for Removal of Sutures - Incision site at the central upper abdomen is clean, dry and intact - Wound cleansed, sutures removed, wound cleansed  and steri strips applied.  - Discussed pathology results showing Epidermal cyst   - Patient advised to keep steri-strips dry until they fall off. - Scars remodel for a full year. - Once steri-strips fall off, patient can apply over-the-counter silicone scar cream each night to help with scar remodeling if desired. - Patient advised to call with any concerns or if they notice any new or changing lesions.   Epidermal cyst Right Lower Back  Benign-appearing. Exam most consistent with an epidermal inclusion cyst. Discussed that a cyst is a benign growth that can grow over time and sometimes get irritated or inflamed. Recommend observation if it is not bothersome. Discussed option of surgical excision to remove it if it is growing, symptomatic, or other changes noted. Please call for new or changing lesions so they can be evaluated.     Return for as schedule in September for Psoriasis.  I, Marye Round, CMA, am acting as scribe for Brendolyn Patty, MD .   Documentation: I have reviewed the above documentation for accuracy and completeness, and I agree with the above.  Brendolyn Patty MD

## 2022-04-13 ENCOUNTER — Other Ambulatory Visit: Payer: Self-pay | Admitting: Internal Medicine

## 2022-04-13 MED ORDER — HYDROCODONE-ACETAMINOPHEN 5-325 MG PO TABS: 1.0000 | ORAL_TABLET | ORAL | 0 refills | Status: DC | PRN

## 2022-04-13 NOTE — Telephone Encounter (Signed)
Name of Medication: Hydrocodone Name of Pharmacy: [facility] [address] Last Fill or Written Date and Quantity: 02-16-22 #60 Last Office Visit and Type: 03-07-22 Next Office Visit and Type: 03-12-23 Last Controlled Substance Agreement Date: N/A Last UDS:N/A

## 2022-04-24 ENCOUNTER — Ambulatory Visit: Payer: 59 | Admitting: Dermatology

## 2022-04-26 ENCOUNTER — Ambulatory Visit: Payer: 59 | Admitting: Dermatology

## 2022-04-26 DIAGNOSIS — L089 Local infection of the skin and subcutaneous tissue, unspecified: Secondary | ICD-10-CM | POA: Diagnosis not present

## 2022-04-26 DIAGNOSIS — T148XXA Other injury of unspecified body region, initial encounter: Secondary | ICD-10-CM | POA: Diagnosis not present

## 2022-04-26 MED ORDER — MUPIROCIN 2 % EX OINT: 1.0000 "application " | TOPICAL_OINTMENT | Freq: Two times a day (BID) | CUTANEOUS | 1 refills | Status: DC

## 2022-04-26 MED ORDER — DOXYCYCLINE MONOHYDRATE 100 MG PO CAPS: 100.0000 mg | ORAL_CAPSULE | Freq: Two times a day (BID) | ORAL | 0 refills | Status: AC

## 2022-04-26 NOTE — Patient Instructions (Addendum)
Start Doxycycline '100mg'$  1 by mouth 2 times a day for 10 days, take with food and drink Start Mupirocin oint 2 times a day Recommend warm compress daily  Doxycycline should be taken with food to prevent nausea. Do not lay down for 30 minutes after taking. Be cautious with sun exposure and use good sun protection while on this medication. Pregnant women should not take this medication.      Due to recent changes in healthcare laws, you may see results of your pathology and/or laboratory studies on MyChart before the doctors have had a chance to review them. We understand that in some cases there may be results that are confusing or concerning to you. Please understand that not all results are received at the same time and often the doctors may need to interpret multiple results in order to provide you with the best plan of care or course of treatment. Therefore, we ask that you please give [place] 2 business days to thoroughly review all your results before contacting the office for clarification. Should we see a critical lab result, you will be contacted sooner.   If You Need Anything After Your Visit  If you have any questions or concerns for your doctor, please call our main line at [phone] and press option 4 to reach your doctor's medical assistant. If no one answers, please leave a voicemail as directed and we will return your call as soon as possible. Messages left after 4 pm will be answered the following business day.   You may also send [place] a message via [place]. We typically respond to MyChart messages within 1-2 business days.  For prescription refills, please ask your pharmacy to contact our office. Our fax number is [phone].  If you have an urgent issue when the clinic is closed that cannot wait until the next business day, you can page your doctor at the number below.    Please note that while we do our best to be available for urgent issues outside of office hours, we are not  available 24/7.   If you have an urgent issue and are unable to reach [place], you may choose to seek medical care at your doctor's office, retail clinic, urgent care center, or emergency room.  If you have a medical emergency, please immediately call 911 or go to the emergency department.  Pager Numbers  - Dr. Nehemiah Massed: [phone]  - Dr. Laurence Ferrari: [phone]  - Dr. Nicole Kindred: [phone]  In the event of inclement weather, please call our main line at [phone] for an update on the status of any delays or closures.  Dermatology Medication Tips: Please keep the boxes that topical medications come in in order to help keep track of the instructions about where and how to use these. Pharmacies typically print the medication instructions only on the boxes and not directly on the medication tubes.   If your medication is too expensive, please contact our office at [phone] option 4 or send [place] a message through [place].   We are unable to tell what your co-pay for medications will be in advance as this is different depending on your insurance coverage. However, we may be able to find a substitute medication at lower cost or fill out paperwork to get insurance to cover a needed medication.   If a prior authorization is required to get your medication covered by your insurance company, please allow [place] 1-2 business days to complete this process.  Drug prices often vary depending  on where the prescription is filled and some pharmacies may offer cheaper prices.  The website www.goodrx.com contains coupons for medications through different pharmacies. The prices here do not account for what the cost may be with help from insurance (it may be cheaper with your insurance), but the website can give you the price if you did not use any insurance.  - You can print the associated coupon and take it with your prescription to the pharmacy.  - You may also stop by our office during regular business hours  and pick up a GoodRx coupon card.  - If you need your prescription sent electronically to a different pharmacy, notify our office through [facility] or by phone at [phone] option 4.     Si Usted Necesita Algo Despus de Su Visita  Tambin puede enviarnos un mensaje a travs de Pharmacist, community. Por lo general respondemos a los mensajes de MyChart en el transcurso de 1 a 2 das hbiles.  Para renovar recetas, por favor pida a su farmacia que se ponga en contacto con nuestra oficina. Harland Dingwall de fax es [place] [phone].  Si tiene un asunto urgente cuando la clnica est cerrada y que no puede esperar hasta el siguiente da hbil, puede llamar/localizar a su doctor(a) al nmero que aparece a continuacin.   Por favor, tenga en cuenta que aunque hacemos todo lo posible para estar disponibles para asuntos urgentes fuera del horario de [place], no estamos disponibles las 24 horas del da, los 7 das de la [place].   Si tiene un problema urgente y no puede comunicarse con nosotros, puede optar por buscar atencin mdica  en el consultorio de su doctor(a), en una clnica privada, en un centro de atencin urgente o en una sala de emergencias.  Si tiene Engineering geologist, por favor llame inmediatamente al 911 o vaya a la sala de emergencias.  Nmeros de bper  - Dr. Nehemiah Massed: [phone]  - Dra. Moye: [phone]  - Dra. Nicole Kindred: [phone]  En caso de inclemencias del [place], por favor llame a Johnsie Kindred principal al [phone] para una actualizacin sobre el [place] de cualquier retraso o cierre.  Consejos para la medicacin en dermatologa: Por favor, guarde las cajas en las que vienen los medicamentos de uso tpico para ayudarle a seguir las instrucciones sobre dnde y cmo usarlos. Las farmacias generalmente imprimen las instrucciones del medicamento slo en las cajas y no directamente en los tubos del [place].   Si su medicamento es muy caro, por favor, pngase  en contacto con Zigmund Daniel llamando al [phone] y presione la opcin 4 o envenos un mensaje a travs de Pharmacist, community.   No podemos decirle cul ser su copago por los medicamentos por adelantado ya que esto es diferente dependiendo de la cobertura de su seguro. Sin embargo, es posible que podamos encontrar un medicamento sustituto a Electrical engineer un formulario para que el seguro cubra el medicamento que se considera necesario.   Si se requiere una autorizacin previa para que su compaa de seguros [place] su medicamento, por favor permtanos de 1 a 2 das hbiles para completar este proceso.  Los precios de los medicamentos varan con frecuencia dependiendo del Environmental consultant de dnde se surte la receta y alguna farmacias pueden ofrecer precios ms baratos.  El sitio web www.goodrx.com tiene cupones para medicamentos de Airline pilot. Los precios aqu no tienen en cuenta lo que podra costar con la ayuda del seguro (puede ser ms barato con su seguro), [employer]  el sitio web puede darle el precio si no Field seismologist.  - Puede imprimir el cupn correspondiente y llevarlo con su receta a la farmacia.  - Tambin puede pasar por nuestra oficina durante el horario de atencin regular y Charity fundraiser una tarjeta de cupones de GoodRx.  - Si necesita que su receta se enve electrnicamente a una farmacia diferente, informe a nuestra oficina a travs de MyChart de [place] o por telfono llamando al [phone] y presione la opcin 4.

## 2022-04-26 NOTE — Progress Notes (Signed)
   Follow-Up Visit   Subjective  Shannon [name] is a 66 y.o. female who presents for the following: Hx of cyst excision that has flared (Central upper abdomen, Last Friday area got red and Saturday opened and drained, cyst excision 04/03/22).   The following portions of the chart were reviewed this encounter and updated as appropriate:       Review of Systems:  No other skin or systemic complaints except as noted in HPI or Assessment and Plan.  Objective  Well appearing patient in no apparent distress; mood and affect are within normal limits.  A focused examination was performed including abdomen. Relevant physical exam findings are noted in the Assessment and Plan.  Central upper abdomen Erythema at excision site, central scar with small round opening draining pus    Assessment & Plan  Wound infection Central upper abdomen  At recent Cyst excision site  Start Doxycycline '100mg'$  1 po bid for 10 days, take with food and drink Start Mupirocin oint bid Recommend warm compress bid  Bacterial culture today  Doxycycline should be taken with food to prevent nausea. Do not lay down for 30 minutes after taking. Be cautious with sun exposure and use good sun protection while on this medication. Pregnant women should not take this medication.    doxycycline (MONODOX) 100 MG capsule - Central upper abdomen Take 1 capsule (100 mg total) by mouth 2 (two) times daily for 10 days. Take with food and drink  mupirocin ointment (BACTROBAN) 2 % - Central upper abdomen Apply 1 application. topically 2 (two) times daily. Bid to wound  Aerobic culture - Central upper abdomen   Return for as scheduled.  I, Othelia Pulling, RMA, am acting as scribe for Brendolyn Patty, MD .  Documentation: I have reviewed the above documentation for accuracy and completeness, and I agree with the above.  Brendolyn Patty MD

## 2022-05-01 ENCOUNTER — Telehealth: Payer: Self-pay

## 2022-05-01 LAB — AEROBIC CULTURE

## 2022-05-01 NOTE — Telephone Encounter (Signed)
Advised pt of culture results.  Advised pt to cont Doxycycline as prescribed and cont Mupirocin oint bid./sh

## 2022-05-01 NOTE — Telephone Encounter (Signed)
-----   Message from Brendolyn Patty, MD sent at 05/01/2022  1:23 PM EDT ----- Culture positive for staph aureus - it is sensitive to Doxy, so she will continue taking Doxycycline and use topical mupirocin twice daily as prescribed. If not improving, she should let us know.  -please call patient

## 2022-05-09 ENCOUNTER — Telehealth: Payer: Self-pay

## 2022-05-09 NOTE — Telephone Encounter (Signed)
Pt called to be worked in the schedule for recurrent cyst that had gotten infected appears to be flaring up again, okay worked pt in the office tomorrow to see Dr Nicole Kindred

## 2022-05-10 ENCOUNTER — Ambulatory Visit: Payer: 59 | Admitting: Dermatology

## 2022-05-10 DIAGNOSIS — T8130XA Disruption of wound, unspecified, initial encounter: Secondary | ICD-10-CM

## 2022-05-10 MED ORDER — SULFAMETHOXAZOLE-TRIMETHOPRIM 800-160 MG PO TABS: 1.0000 | ORAL_TABLET | Freq: Two times a day (BID) | ORAL | 0 refills | Status: DC

## 2022-05-10 NOTE — Progress Notes (Signed)
   Follow-Up Visit   Subjective  Shannon [name] is a 66 y.o. female who presents for the following: Follow-up (Patient here today for recheck of cyst at central upper abdomen. Cyst was excised 04/03/22 and patient returned to clinic on 04/26/22 because cyst site had opened up and was draining. Patient had culture with + results for staph and took doxycycline 100 mg twice daily for 10 days. ). Really irritated stomach.  Patient advises today that the abdomen is tender and feels that there is a burning sensation. there is something hard sticking out at excision site.   The following portions of the chart were reviewed this encounter and updated as appropriate:       Review of Systems:  No other skin or systemic complaints except as noted in HPI or Assessment and Plan.  Objective  Well appearing patient in no apparent distress; mood and affect are within normal limits.  A focused examination was performed including abdomen. Relevant physical exam findings are noted in the Assessment and Plan.  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   Return for as scheduled.  Graciella Belton, RMA, am acting as scribe for Brendolyn Patty, MD .  Documentation: I have reviewed the above documentation for accuracy and completeness, and I agree with the above.  Brendolyn Patty MD

## 2022-05-10 NOTE — Patient Instructions (Signed)
Due to recent changes in healthcare laws, you may see results of your pathology and/or laboratory studies on MyChart before the doctors have had a chance to review them. We understand that in some cases there may be results that are confusing or concerning to you. Please understand that not all results are received at the same time and often the doctors may need to interpret multiple results in order to provide you with the best plan of care or course of treatment. Therefore, we ask that you please give us 2 business days to thoroughly review all your results before contacting the office for clarification. Should we see a critical lab result, you will be contacted sooner.   If You Need Anything After Your Visit  If you have any questions or concerns for your doctor, please call our main line at [phone] and press option 4 to reach your doctor's medical assistant. If no one answers, please leave a voicemail as directed and we will return your call as soon as possible. Messages left after 4 pm will be answered the following business day.   You may also send us a message via MyChart. We typically respond to MyChart messages within 1-2 business days.  For prescription refills, please ask your pharmacy to contact our office. Our fax number is [phone].  If you have an urgent issue when the clinic is closed that cannot wait until the next business day, you can page your doctor at the number below.    Please note that while we do our best to be available for urgent issues outside of office hours, we are not available 24/7.   If you have an urgent issue and are unable to reach us, you may choose to seek medical care at your doctor's office, retail clinic, urgent care center, or emergency room.  If you have a medical emergency, please immediately call 911 or go to the emergency department.  Pager Numbers  - Dr. Kowalski: [phone]  - Dr. Moye: [phone]  - Dr. Stewart:  [phone]  In the event of inclement weather, please call our main line at [phone] for an update on the status of any delays or closures.  Dermatology Medication Tips: Please keep the boxes that topical medications come in in order to help keep track of the instructions about where and how to use these. Pharmacies typically print the medication instructions only on the boxes and not directly on the medication tubes.   If your medication is too expensive, please contact our office at [phone] option 4 or send us a message through MyChart.   We are unable to tell what your co-pay for medications will be in advance as this is different depending on your insurance coverage. However, we may be able to find a substitute medication at lower cost or fill out paperwork to get insurance to cover a needed medication.   If a prior authorization is required to get your medication covered by your insurance company, please allow us 1-2 business days to complete this process.  Drug prices often vary depending on where the prescription is filled and some pharmacies may offer cheaper prices.  The website www.goodrx.com contains coupons for medications through different pharmacies. The prices here do not account for what the cost may be with help from insurance (it may be cheaper with your insurance), but the website can give you the price if you did not use any insurance.  - You can print the associated coupon and take it with   your prescription to the pharmacy.  - You may also stop by our office during regular business hours and pick up a GoodRx coupon card.  - If you need your prescription sent electronically to a different pharmacy, notify our office through [facility] MyChart or by phone at [phone] option 4.     Si Usted Necesita Algo Despus de Su Visita  Tambin puede enviarnos un mensaje a travs de MyChart. Por lo general respondemos a los mensajes de MyChart en el transcurso de 1 a 2  das hbiles.  Para renovar recetas, por favor pida a su farmacia que se ponga en contacto con nuestra oficina. Nuestro nmero de fax es el [phone].  Si tiene un asunto urgente cuando la clnica est cerrada y que no puede esperar hasta el siguiente da hbil, puede llamar/localizar a su doctor(a) al nmero que aparece a continuacin.   Por favor, tenga en cuenta que aunque hacemos todo lo posible para estar disponibles para asuntos urgentes fuera del horario de oficina, no estamos disponibles las 24 horas del da, los 7 das de la semana.   Si tiene un problema urgente y no puede comunicarse con nosotros, puede optar por buscar atencin mdica  en el consultorio de su doctor(a), en una clnica privada, en un centro de atencin urgente o en una sala de emergencias.  Si tiene una emergencia mdica, por favor llame inmediatamente al 911 o vaya a la sala de emergencias.  Nmeros de bper  - Dr. Kowalski: [phone]  - Dra. Moye: [phone]  - Dra. Stewart: [phone]  En caso de inclemencias del tiempo, por favor llame a nuestra lnea principal al [phone] para una actualizacin sobre el estado de cualquier retraso o cierre.  Consejos para la medicacin en dermatologa: Por favor, guarde las cajas en las que vienen los medicamentos de uso tpico para ayudarle a seguir las instrucciones sobre dnde y cmo usarlos. Las farmacias generalmente imprimen las instrucciones del medicamento slo en las cajas y no directamente en los tubos del medicamento.   Si su medicamento es muy caro, por favor, pngase en contacto con nuestra oficina llamando al [phone] y presione la opcin 4 o envenos un mensaje a travs de MyChart.   No podemos decirle cul ser su copago por los medicamentos por adelantado ya que esto es diferente dependiendo de la cobertura de su seguro. Sin embargo, es posible que podamos encontrar un medicamento sustituto a menor costo o llenar un formulario para que el  seguro cubra el medicamento que se considera necesario.   Si se requiere una autorizacin previa para que su compaa de seguros cubra su medicamento, por favor permtanos de 1 a 2 das hbiles para completar este proceso.  Los precios de los medicamentos varan con frecuencia dependiendo del lugar de dnde se surte la receta y alguna farmacias pueden ofrecer precios ms baratos.  El sitio web www.goodrx.com tiene cupones para medicamentos de diferentes farmacias. Los precios aqu no tienen en cuenta lo que podra costar con la ayuda del seguro (puede ser ms barato con su seguro), pero el sitio web puede darle el precio si no utiliz ningn seguro.  - Puede imprimir el cupn correspondiente y llevarlo con su receta a la farmacia.  - Tambin puede pasar por nuestra oficina durante el horario de atencin regular y recoger una tarjeta de cupones de GoodRx.  - Si necesita que su receta se enve electrnicamente a una farmacia diferente, informe a nuestra oficina a travs de MyChart de [place]   o por telfono llamando al [phone] y presione la opcin 4.  

## 2022-05-11 ENCOUNTER — Telehealth: Payer: Self-pay

## 2022-05-11 NOTE — Telephone Encounter (Signed)
Per appt notes and note below access nurse note pt already has appt scheduled with Dr Silvio Pate on 05/12/22 at 12:45. Sending note to Dr Silvio Pate and [facility] CMA.     [facility] Day - Client TELEPHONE ADVICE RECORD AccessNurse Patient Name: Shannon [name] Gender: Female DOB: 1956/05/14 Age: 66 Y 3 M 1 D Return Phone Number: [phone] (Primary) Address: [place]/ State/ Zip: [place] [address] Client [facility] - Client Client Site [facility] - Day Provider Viviana Simpler- MD Contact Type Call Who Is Calling Patient / Member / Family / Caregiver Call Type Triage / Clinical Relationship To Patient Self Return Phone Number [phone] (Primary) Chief Complaint Dizziness Reason for Call Symptomatic / Request for [place] states she has strange tingling in [name] head that has been going on for 3 days. She is dizzy and has upper abdominal pain. Translation No Nurse Assessment Nurse: Laurance Flatten, RN, Geni Bers Date/Time (Eastern Time): 05/11/2022 4:04:48 PM Confirm and document reason for call. If symptomatic, describe symptoms. ---Caller stated she had a strange feeling in [name] head and dizziness at times, she stated it is not tingling. She is also having pain below an incision site that she discussed with [name] doctor yesterday and was started on antibiotics. She did not discuss the feeling in [name] head and the dizziness. No fever. Does the patient have any new or worsening symptoms? ---Yes Will a triage be completed? ---Yes Related visit to physician within the last 2 weeks? ---No Does the PT have any chronic conditions? (i.e. diabetes, asthma, this includes High risk factors for pregnancy, etc.) ---Yes List chronic conditions. ---Diabetes, HTN, Spondylisis, Glaucoma Is this a behavioral health or substance abuse call? ---No Guidelines Guideline Title Affirmed  Question Affirmed Notes Nurse Date/Time (Eastern Time) Dizziness - Lightheadedness Taking a medicine that could cause dizziness (e.g., blood pressure medications, diuretics) Laurance Flatten, RN, Geni Bers 05/11/2022 4:10:33 PM PLEASE NOTE: All timestamps contained within this report are represented as [place] Standard Time. CONFIDENTIALTY NOTICE: This fax transmission is intended only for the addressee. It contains information that is legally privileged, confidential or otherwise protected from use or disclosure. If you are not the intended recipient, you are strictly prohibited from reviewing, disclosing, copying using or disseminating any of this information or taking any action in reliance on or regarding this information. If you have received this fax in error, please notify us immediately by telephone so that we can arrange for its return to [place]. Phone: [phone], Toll-Free: [phone], Fax: [phone] Page: 2 of 2 Call Id: [id number] [place]. Time Eilene Ghazi Time) Disposition Final User 05/11/2022 4:18:36 PM See PCP within 24 Hours Yes Laurance Flatten, RN, Althea Grimmer Disagree/Comply Comply Caller Understands Yes PreDisposition InappropriateToAsk Care Advice Given Per Guideline SEE PCP WITHIN 24 HOURS: * IF OFFICE WILL BE OPEN: You need to be examined within the next 24 hours. Call your doctor (or NP/PA) when the office opens and make an appointment. MEDICINE AS A CAUSE: * Your medicine may or may not be causing your dizziness. Your doctor can help you decide. CALL BACK IF: * You become worse Referrals REFERRED TO PCP OFFIC

## 2022-05-11 NOTE — Telephone Encounter (Signed)
Pt will be seen tomorrow at 1245.

## 2022-05-11 NOTE — Telephone Encounter (Signed)
Yes---I did add her on

## 2022-05-11 NOTE — Telephone Encounter (Signed)
Patient called in stating she was having some strange symptoms such as upper abdominal pain, tingling in the head (going on for 3 days), and today she noticed she is dizzy. Passed patient on to access nurse.

## 2022-05-12 ENCOUNTER — Encounter: Payer: Self-pay | Admitting: Internal Medicine

## 2022-05-12 ENCOUNTER — Ambulatory Visit: Payer: 59 | Admitting: Internal Medicine

## 2022-05-12 DIAGNOSIS — R4189 Other symptoms and signs involving cognitive functions and awareness: Secondary | ICD-10-CM | POA: Insufficient documentation

## 2022-05-12 MED ORDER — ONDANSETRON 4 MG PO TBDP: 4.0000 mg | ORAL_TABLET | Freq: Four times a day (QID) | ORAL | 1 refills | Status: DC | PRN

## 2022-05-12 MED ORDER — FLUCONAZOLE 150 MG PO TABS
150.0000 mg | ORAL_TABLET | ORAL | 1 refills | Status: DC
Start: 2022-05-12 — End: 2022-10-19

## 2022-06-17 ENCOUNTER — Other Ambulatory Visit: Payer: Self-pay | Admitting: Internal Medicine

## 2022-06-17 DIAGNOSIS — E1142 Type 2 diabetes mellitus with diabetic polyneuropathy: Secondary | ICD-10-CM

## 2022-06-24 ENCOUNTER — Other Ambulatory Visit: Payer: Self-pay | Admitting: Internal Medicine

## 2022-06-26 ENCOUNTER — Other Ambulatory Visit: Payer: Self-pay

## 2022-06-26 ENCOUNTER — Other Ambulatory Visit: Payer: Self-pay | Admitting: Internal Medicine

## 2022-06-26 MED ORDER — DEXCOM G6 SENSOR MISC: 3 refills | Status: DC

## 2022-06-26 MED ORDER — HYDROCODONE-ACETAMINOPHEN 5-325 MG PO TABS
1.0000 | ORAL_TABLET | ORAL | 0 refills | Status: DC | PRN
Start: 2022-06-26 — End: 2022-08-29

## 2022-06-26 NOTE — Telephone Encounter (Signed)
Name of Medication: Hydrocodone Name of Pharmacy: [facility] or Written Date and Quantity: 04-13-22 #60 Last Office Visit and Type: 05-12-22 Next Office Visit and Type: 03-12-23 Last Controlled Substance Agreement Date: N/A Last UDS:N/A  Pantoprazole already sent to [facility] last week.

## 2022-06-27 ENCOUNTER — Other Ambulatory Visit
Admission: RE | Admit: 2022-06-27 | Discharge: 2022-06-27 | Disposition: A | Discharge status: Home / Self Care | Payer: 59 | Attending: Internal Medicine | Admitting: Internal Medicine

## 2022-06-27 DIAGNOSIS — E785 Hyperlipidemia, unspecified: Secondary | ICD-10-CM | POA: Insufficient documentation

## 2022-06-27 DIAGNOSIS — Z79899 Other long term (current) drug therapy: Secondary | ICD-10-CM | POA: Diagnosis present

## 2022-06-27 DIAGNOSIS — E1169 Type 2 diabetes mellitus with other specified complication: Secondary | ICD-10-CM | POA: Diagnosis present

## 2022-06-27 LAB — LIPID PANEL
Cholesterol: 165 mg/dL (ref 0–200)
HDL: 51 mg/dL (ref >40)
LDL Cholesterol: 79 mg/dL (ref 0–99)
Total CHOL/HDL Ratio: 3.2 RATIO
Triglycerides: 176 mg/dL — ABNORMAL HIGH (ref <150)
VLDL: 35 mg/dL (ref 0–40)

## 2022-06-27 LAB — ALT: ALT: 48 U/L — ABNORMAL HIGH (ref 0–44)

## 2022-06-29 ENCOUNTER — Telehealth: Payer: Self-pay | Admitting: *Deleted

## 2022-06-29 DIAGNOSIS — Z79899 Other long term (current) drug therapy: Secondary | ICD-10-CM

## 2022-06-29 DIAGNOSIS — E1169 Type 2 diabetes mellitus with other specified complication: Secondary | ICD-10-CM

## 2022-06-29 NOTE — Telephone Encounter (Signed)
Pt notified of results and Dr. Darnelle Bos recc. Pt voiced understanding.  Pt will continue rosuvastatin 5 mg daily and will recheck lipid panel and ALT in 6 months.  Follow up reminder sent to call pt re 6 month labs.  Orders placed.   Pt has no further questions at this time.

## 2022-06-29 NOTE — Telephone Encounter (Signed)
-----   Message from Nelva Bush, MD sent at 06/29/2022 10:31 AM EDT ----- Please let Shannon [name] know that her cholesterol is essentially unchanged since increasing rosuvastatin to 5 mg daily.  Her ALT is borderline elevated.  I recommend that she continue her current medications.  We should recheck a lipid panel and ALT in about 6 months.  In the meantime, I encourage her to keep working on lifestyle modifications.

## 2022-07-14 ENCOUNTER — Other Ambulatory Visit: Payer: Self-pay | Admitting: Internal Medicine

## 2022-07-20 ENCOUNTER — Ambulatory Visit: Payer: 59 | Admitting: Internal Medicine

## 2022-07-20 ENCOUNTER — Encounter: Payer: Self-pay | Admitting: Internal Medicine

## 2022-07-20 VITALS — BP 130/72 | HR 101 | Ht 64.0 in | Wt 165.4 lb

## 2022-07-20 DIAGNOSIS — E782 Mixed hyperlipidemia: Secondary | ICD-10-CM | POA: Diagnosis not present

## 2022-07-20 DIAGNOSIS — E1165 Type 2 diabetes mellitus with hyperglycemia: Secondary | ICD-10-CM | POA: Diagnosis not present

## 2022-07-20 DIAGNOSIS — E1142 Type 2 diabetes mellitus with diabetic polyneuropathy: Secondary | ICD-10-CM | POA: Diagnosis not present

## 2022-07-20 LAB — POCT GLYCOSYLATED HEMOGLOBIN (HGB A1C): Hemoglobin A1C: 8.5 % — AB (ref 4.0–5.6)

## 2022-07-20 MED ORDER — OMNIPOD 5 DEXG7G6 INTRO GEN 5 KIT: 1.0000 | PACK | Freq: Once | 0 refills | Status: DC

## 2022-07-20 MED ORDER — OMNIPOD 5 DEXG7G6 PODS GEN 5 MISC: 1.0000 | 3 refills | Status: DC

## 2022-07-20 NOTE — Patient Instructions (Addendum)
Patient Instructions  Please use the following pump settings: - basal rates: 12 am: 1.0 >> 1.2 3 am: 0.9  6 am: 0.7  9 am: 0.8 >> 1.1 9 PM: 1.1 >> 1.2 - ICR:   12 AM: 1: 17 >> 1:14 8 PM: 1: 9 >> 1:8 - target: 120-120  - ISF: 1:60 - Active insulin time: 6 hours  Please continue: - Jardiance 10 mg before b'fast.  Please try to change to Omnipod 5.  Schedule an appt with Vaughan Basta for this.  Please return in 3-4 months.

## 2022-07-20 NOTE — Progress Notes (Signed)
Patient ID: Shannon [name], female   DOB: 02-Aug-1956, 66 y.o.   MRN: [id number]   HPI: Shannon [name] is a 66 y.o.-year-old female, initially referred by her PCP, Dr. Silvio Pate, returning for follow-up for DM2, dx in 2012, insulin-dependent since ~2019, uncontrolled, with complications (peripheral neuropathy). Last visit 4 months ago.  She is here with her husband.  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  She was not working, and not sleeping well.  At today's visit she tells me that her mother died 2 days ago.  She was very stressed when taking care of her and was not able to keep up very well with her insulin boluses. She has occasional nausea but no increased urination, nausea, chest pain. She has blurry vision and increased thirst.  Reviewed history: Patient described that her sugars worsened after she had to stop Metformin due to GI symptoms and was changed to sulfonylurea. They increased even further after steroid injection for Achilles tendinitis.  Her sugars were initially in the 300-400 range, but then increased to 500-HI (>600) >> she presented to the emergency room (11/12/2020).  Afterwards, she started to work on her diet >> she tells me that she was barely eating anything throughout the day. Sugars improved some, but they were still elevated.  Insulin pump: -OmniPod Dash  Insulin: -She has allergy to Humalog/Lyumjev >> significant irritation and inflammation at the infusion site -Switched to NovoLog samples since NovoLog was not covered by her insurance despite 2 preauthorization requests - U500 now  CGM: -Dexcom G6  Supplies: -Mail-order pharmacy for Dexcom  Reviewed HbA1c levels: Lab Results  Component Value Date   HGBA1C 8.5 (A) 03/16/2022   HGBA1C 8.2 (A) 11/15/2021   HGBA1C 8.0 (A) 07/22/2021   HGBA1C 10.4 (A) 02/15/2021   HGBA1C 9.0 (A) 11/16/2020   HGBA1C 7.8 (A) 07/13/2020   HGBA1C 8.1 (H) 12/22/2019   HGBA1C  7.3 (A) 07/04/2019   HGBA1C 8.9 (H) 01/22/2019   HGBA1C 8.8 (H) 10/15/2018   HGBA1C 7.9 (H) 02/13/2018   HGBA1C 7.0 (H) 09/19/2017   HGBA1C 8.6 (H) 04/13/2017   HGBA1C 8.2 (H) 11/16/2016   HGBA1C 7.4 (H) 05/17/2016   HGBA1C 7.8 (H) 11/19/2015   HGBA1C 7.2 (H) 04/23/2015   HGBA1C 8.4 (H) 01/14/2015   HGBA1C 8.6 (H) 09/22/2014   HGBA1C 8.0 (H) 03/31/2014   HGBA1C 6.9 (H) 09/23/2013   HGBA1C 7.0 (H) 03/17/2013   HGBA1C 6.5 09/16/2012   HGBA1C 6.6 (H) 02/20/2012   HGBA1C 9.0 (H) 10/19/2011   Previously on: - Glimepiride 2 >> 4 mg before breakfast   - Onglyza 5 mg before breakfast - Lantus 30-35 units at bedtime She is intolerant to Metformin >> N/V/D >> had to stop and changed to Glimepiride.  Previously on: - Ozempic 0.5 mg weekly >> stopped 2/2 nausea - Lantus 26 >> 34 >> Toujeo 46 units at bedtime - Lyumjev 6-10 >> 12-18 >> 15 >> 20 units before each meal  Now on: - Farxiga 10 mg before breakfast- tried this in 03/2021 (not covered) >> restarted 07/2021 - Pump settings with U500 - changes suggested at last OV are shown in bold - basal rates: 12 am: 1.0 3 am: 0.9  6 am: 0.7  9 am: 0.8 9 PM: 1.1 - ICR:   12 AM: 1: 22 >> 1:17 8 PM: 1: 13 >> 1:9 - target: 120-120  - ISF: 1:60 - Active insulin time: 6 hours  TDD from basal insulin: 70.9 units (57%) >> 64% >> 63% >> 61% >> 67-78% TDD from bolus insulin: 54.1 units (43%) >> 36% >> 37% >> 39% >> 22-33% Total daily dose: 150-170 units/day - extended bolusing: not using - changes infusion site: q1-2 days  She checks her sugars more than 4 times a day with her  Dexcom CGM:   Previously:   Previously:   Lowest sugar was 57 x1 >> LO >> 50s >> 55; she has hypoglycemia awareness at 110.  Highest sugar was HI ... >> 400 >> 400 >> 300s.  Glucometer: One Touch Verio  Pt's meals are: - Breakfast: usually skips, when eats b'fast: sausage bisquit - Lunch: salad or sandwich - Dinner: meat and 2 veggies - Snacks: 1-2:  apples, oranges, occasionally chips  -+ CKD, last BUN/creatinine:  Lab Results  Component Value Date   BUN 14 09/15/2021   BUN 17 03/16/2021   CREATININE 0.93 09/15/2021   CREATININE 0.90 03/16/2021  She is not on ACE inhibitor/ARB. She had cough with lisinopril.  -+ HL; last set of lipids: Lab Results  Component Value Date   CHOL 165 06/27/2022   HDL 51 06/27/2022   LDLCALC 79 06/27/2022   LDLDIRECT 105.0 12/22/2019   TRIG 176 (H) 06/27/2022   CHOLHDL 3.2 06/27/2022  On Crestor 5 mg  daily, increased from 3 times a week.  - last eye exam was in 2023: No DR reportedly. She has glaucoma - exams q6 mo. Her blurry vision improved (had laser surgery for glaucoma 05/2021).   -She has numbness and tingling in her toes. She also has muscle cramps in her feet.  Last foot exam 03/07/2022.  Pt has FH of DM in mother, son, daughter, sister, MGM, MGF, M aunt and uncles.  She also has a history of HTN, fatty liver, GERD. She has psoriatic arthritis and ankylosing spondylitis. She has significant joint pain - hands, back, hips.  She is on a biologic infusion (Cosentyx). She presented to the ED for nausea on 12/24/2019.  Per review of the labs, she appeared dehydrated, but no distinct pathology was found.  Of note, lipase was normal. She started Lopressor for HTN - but had dizziness >> stopped.  TSH was normal: Lab Results  Component Value Date   TSH 0.99 03/16/2021   ROS: + see HPI  I reviewed pt's medications, allergies, PMH, social hx, family hx, and changes were documented in the history of present illness. Otherwise, unchanged from my initial visit note.  Past Medical History:  Diagnosis Date   Actinic keratosis    Allergy    Arthritis    hip, back, shoulders, joints   Asthma    Complication of anesthesia    problems with block during breast augmentation and epidural during childbirth   Depression    Diabetes mellitus 09/2011   type 2   Family history of adverse reaction to  anesthesia    PROBLEMS WITH BLOCKS   Fatty liver    GERD (gastroesophageal reflux disease)    Glaucoma suspect    Headache    migraines-4x a week headache-1x week migraine   History of hiatal hernia    Hyperlipidemia    Hypertension    Controlled on meds   Mitral valve prolapse    Nose colonized with MRSA 05/15/2018   Osteopenia    Pneumonia    PONV (postoperative nausea and vomiting)    Psoriasis    Shingles 11/2006   Sleep disturbance  Tachycardia    Vertigo    Hx of   Past Surgical History:  Procedure Laterality Date   ABDOMINAL HYSTERECTOMY  2000   /BSO - abn. Paps   APPENDECTOMY  1978   AUGMENTATION MAMMAPLASTY Bilateral 1981   BLADDER REPAIR     x2   BREAST ENHANCEMENT SURGERY  1987   CARDIAC CATHETERIZATION     [facility]   CARDIAC CATHETERIZATION Left 12/18/2016   Procedure: Left Heart Cath and Coronary Angiography;  Surgeon: Wellington Hampshire, MD;  Location: [place] [facility];  Service: Cardiovascular;  Laterality: Left;   CARPAL TUNNEL RELEASE Right    CERVICAL DISCECTOMY  11/2009   with synthetic discs inserted--  Dr Arnoldo Morale   COLONOSCOPY     JOINT REPLACEMENT     NM MYOVIEW LTD  03/2005   EF 56%, no ischemia   RECTAL PROLAPSE REPAIR     x1   SHOULDER ARTHROSCOPY WITH OPEN ROTATOR CUFF REPAIR Left 12/29/2019   Procedure: LEFT SHOULDER ARTHROSCOPIC DISTAL CLAVICLE EXCISISON, SUBACROMIAL DECOMPRESSION,BICEPS [place] ;  Surgeon: Lovell Sheehan, MD;  Location: [facility];  Service: Orthopedics;  Laterality: Left;   SHOULDER CLOSED REDUCTION Left 05/03/2020   Procedure: Left shoulder arthroscopic lysis of adhesions and manipulation under anesthesia;  Surgeon: Lovell Sheehan, MD;  Location: [facility];  Service: Orthopedics;  Laterality: Left;   SHOULDER SURGERY  09/2008, 01/2009   Right shoulder surgery, then redone with lysis of adhesions   TOTAL HIP ARTHROPLASTY Right 05/29/2018   Procedure: TOTAL HIP ARTHROPLASTY ANTERIOR APPROACH;  Surgeon:  Lovell Sheehan, MD;  Location: [facility];  Service: Orthopedics;  Laterality: Right;   UPPER GI ENDOSCOPY     Social History   Socioeconomic History   Marital status: Married    Spouse name: Not on file   Number of children: 5   Years of education: Not on file   Highest education level: Not on file  Occupational History   Occupation: homemaker  Tobacco Use   Smoking status: Never    Passive exposure: Past   Smokeless tobacco: Never  Vaping Use   Vaping Use: Never used  Substance and Sexual Activity   Alcohol use: No   Drug use: No   Sexual activity: Not on file  Other Topics Concern   Not on file  Social History Narrative   Home maker   Social Determinants of Health   Financial Resource Strain: Not on file  Food Insecurity: Not on file  Transportation Needs: Not on file  Physical Activity: Not on file  Stress: Not on file  Social Connections: Not on file  Intimate Partner Violence: Not on file   Current Outpatient Medications on File Prior to Visit  Medication Sig Dispense Refill   aspirin-acetaminophen-caffeine (EXCEDRIN MIGRAINE) 250-250-65 MG tablet Take 1 tablet by mouth every 6 (six) hours as needed for headache.     Blood Glucose Monitoring Suppl (FIFTY50 GLUCOSE METER 2.0) w/Device KIT OneTouch Ultra2 Meter kit     cetirizine (ZYRTEC) 10 MG tablet Take 10 mg by mouth daily.      clobetasol (TEMOVATE) 0.05 % external solution Apply 1 application topically as directed. 1-2 gtts qd/bid aa scalp prn flares, avoid face, groin, axilla 50 mL 2   Continuous Blood Gluc Receiver (DEXCOM G6 RECEIVER) DEVI Use as instructed to check blood sugar 1 each 0   Continuous Blood Gluc Sensor (DEXCOM G6 SENSOR) MISC CHANGE 1 SENSOR EVERY 10  DAYS AS DIRECTED 9 each 3  Continuous Blood Gluc Transmit (DEXCOM G6 TRANSMITTER) MISC 1 Device by Does not apply route every 3 (three) months. 1 each 3   COSENTYX SENSOREADY, 300 MG, 150 MG/ML SOAJ Inject 300 mg into the skin every 30 (thirty)  days.     docusate sodium (COLACE) 100 MG capsule Take 300 mg by mouth at bedtime.      DULoxetine (CYMBALTA) 30 MG capsule Take 1 capsule (30 mg total) by mouth daily. 90 capsule 3   fluconazole (DIFLUCAN) 150 MG tablet Take 1 tablet (150 mg total) by mouth once a week. 2 tablet 1   Fluocinolone Acetonide 0.01 % OIL Apply 1-2 gtts to ears once to twice daily as needed until improved. 20 mL 2   fluticasone (FLONASE) 50 MCG/ACT nasal spray Place 1 spray into both nostrils daily.     furosemide (LASIX) 20 MG tablet Take 1 tablet (20 mg total) by mouth daily. 90 tablet 3   Glucagon 3 MG/DOSE POWD Place 3 mg into the nose once as needed for up to 1 dose. 1 each 11   HUMULIN R 500 UNIT/ML injection USE UP TO 200 UNITS OF CONCENTRATED INSULIN IN THE INSULIN PUMP DAILY 20 mL 11   HYDROcodone-acetaminophen (NORCO/VICODIN) 5-325 MG tablet Take 1 tablet by mouth every 4 (four) hours as needed for moderate pain. 60 tablet 0   Insulin Disposable Pump (OMNIPOD DASH PODS, GEN 4,) MISC USE AS DIRECTED DAILY 90 each 0   JARDIANCE 10 MG TABS tablet TAKE 1 TABLET BY MOUTH DAILY  BEFORE BREAKFAST 90 tablet 3   LUMIGAN 0.01 % SOLN Place 1 drop into both eyes at bedtime.      metoprolol succinate (TOPROL-XL) 25 MG 24 hr tablet TAKE 1 AND 1/2 TABLETS BY  MOUTH DAILY 135 tablet 3   mupirocin ointment (BACTROBAN) 2 % Apply 1 application. topically 2 (two) times daily. Bid to wound 22 g 1   ondansetron (ZOFRAN ODT) 4 MG disintegrating tablet Take 1 tablet (4 mg total) by mouth every 6 (six) hours as needed for nausea or vomiting. 90 tablet 1   ONETOUCH DELICA LANCETS FINE MISC Check blood sugar once daily and as needed. 250.00 100 each 1   ONETOUCH ULTRA test strip USE TO CHECK BLOOD SUGAR 2  TIMES DAILY 200 strip 3   pantoprazole (PROTONIX) 40 MG tablet TAKE 1 TABLET BY MOUTH  TWICE DAILY ON AN EMPTY  STOMACH 180 tablet 3   rosuvastatin (CRESTOR) 5 MG tablet Take 1 tablet (5 mg total) by mouth daily. 90 tablet 3    Secukinumab (COSENTYX [place]) Inject into the skin.     sucralfate (CARAFATE) 1 g tablet Take 1 tablet (1 g total) by mouth in the morning, at noon, and at bedtime. 270 tablet 3   Tapinarof (VTAMA) 1 % CREA Apply to affected skin daily 60 g 1   traZODone (DESYREL) 100 MG tablet TAKE 2 TABLETS BY MOUTH  EVERY NIGHT AT BEDTIME 180 tablet 3   triamterene-hydrochlorothiazide (MAXZIDE-25) 37.5-25 MG tablet TAKE 1 TABLET BY MOUTH ONCE DAILY 90 tablet 3   valACYclovir (VALTREX) 1000 MG tablet TAKE 2 TABLETS BY MOUTH  AT ONSET OF SYMPTOMS AS NEEDED FOR FEVER BLISTERS AND TAKE 2 TABLETS 12 HOURS LATER. 90 tablet 0   No current facility-administered medications on file prior to visit.   Allergies  Allergen Reactions   Humalog [Insulin Lispro] Swelling   Lisinopril Cough   Nitrofurantoin Other (See Comments)    Pt unsure   Oxycodone-Acetaminophen  Itching   Percocet [Oxycodone-Acetaminophen]    Atorvastatin Diarrhea    May have caused colitis--but not clear cut   Clarithromycin Palpitations   Glipizide Other (See Comments)    Sugars too variable   Invokana [Canagliflozin]     Nausea, constipation   Family History  Problem Relation Age of Onset   Diabetes Mother    Heart disease Mother    Hypertension Mother    Ovarian cancer Mother    Melanoma Mother    Cancer Father    Coronary artery disease Father    Heart disease Father    Diabetes Sister    Depression Sister    Coronary artery disease Sister    Heart disease Sister    Pulmonary Hypertension Sister    Rheum arthritis Maternal Aunt    Heart attack Paternal Uncle    Heart attack Paternal Uncle    Breast cancer Maternal Grandmother    Diabetes Maternal Grandmother    Diabetes Maternal Grandfather    Heart attack Cousin    Colon cancer Neg Hx    PE: BP 130/72 (BP Location: Right Arm, Patient Position: Sitting, Cuff Size: Normal)   Pulse (!) 101   Ht '5\' 4"'  (1.626 m)   Wt 165 lb 6.4 oz (75 kg)   SpO2 94%   BMI 28.39 kg/m  Wt  Readings from Last 3 Encounters:  07/20/22 165 lb 6.4 oz (75 kg)  05/12/22 165 lb (74.8 kg)  03/29/22 167 lb (75.8 kg)   Constitutional: normal weight, in NAD Eyes: EOMI, no exophthalmos ENT: moist mucous membranes, no thyromegaly, no cervical lymphadenopathy Cardiovascular: RRR, No MRG Respiratory: CTA B Musculoskeletal: no deformities Skin: moist, warm, no rashes Neurological: no tremor with outstretched hands  ASSESSMENT: 1. DM2, insulin-dependent, uncontrolled, without long-term complications, but with significant hyperglycemia  2. HL  PLAN:  1. Patient with expanding, uncontrolled, type 2 diabetes, previously on the basal/bolus insulin regimen with very high blood sugars, but now doing better on the U-500 insulin regimen.  We tried to add Ozempic but she had to stop due to constant nausea.  We were able to add [place] afterwards, which she tolerates well. -At last visit, HbA1c was high, at 8.5%, but at that time, she was having very high blood sugars after dinner, in the 300s and 400s, and persistently high blood sugars overnight.  We strengthened her insulin to carb ratios especially for dinner.  We did not change the basal rates.  At that time she had a lot of stress being a caregiver for her mother.  She also had a previous upper respiratory infection. -At today's visit, she tells me that her mother just died 2 days ago.  She is grieving. CGM interpretation: -At today's visit, we reviewed her CGM downloads: It appears that 36% of values are in target range (goal >70%), while 54% are higher than 180 (goal <25%), and 0% are lower than 70 (goal <4%).  The calculated average blood sugar is 211.  The projected HbA1c for the next 3 months (GMI) is 8.4%. -Reviewing the CGM trends, sugars are fluctuating around the upper limit of the target range, with frequent values in the 200-250 interval or even higher.  They are not much change compared to last visit.  They are mostly increasing after  the first and last meal of the day.  Upon questioning and upon review of her individual daily traces and pump downloads, it appears that she does not bolus for meals, only occasionally missing boluses,  but, upon questioning, she is now bolusing 30 minutes before.  Sometimes, she boluses after she already ate.  We discussed that this will lead to immediate postprandial hyperglycemia followed by possible hypoglycemia.  We discussed that it is most important to involve 30 minutes before the meal, but at today's visit, to improve her blood sugars throughout the day and overnight, we will also increase her basal rates.  Since even when she is bolusing correctly her sugars go up after meals, I advised her to strengthen her insulin to carb ratios.  This is also in preparation for switching to OmniPod 5, for which I sent a prescription to her pharmacy.  I advised her to try to start this as soon as possible. -We discussed about the difference between OmniPod 5 and Dashand the fact that we usually need to strengthen insulin to carb ratios on the Omnipod 5.  -I suggested: Patient Instructions  Please use the following pump settings: - basal rates: 12 am: 1.0 >> 1.2 3 am: 0.9  6 am: 0.7  9 am: 0.8 >> 1.1 9 PM: 1.1 >> 1.2 - ICR:   12 AM: 1: 17 >> 1:14 8 PM: 1: 9 >> 1:8 - target: 120-120  - ISF: 1:60 - Active insulin time: 6 hours  Please continue: - Jardiance 10 mg before b'fast.  Please try to change to Omnipod 5.  Schedule an appt with Vaughan Basta for this.  Please return in 3-4 months.  - we checked her HbA1c: 8.5% (stable) - advised to check sugars at different times of the day - 4x a day, rotating check times - advised for yearly eye exams >> she is UTD - return to clinic in 3-4 months  2. HL -Reviewed latest lipid panel from earlier this month: LDL improved, at goal, triglycerides slightly high: Lab Results  Component Value Date   CHOL 165 06/27/2022   HDL 51 06/27/2022   LDLCALC 79  06/27/2022   LDLDIRECT 105.0 12/22/2019   TRIG 176 (H) 06/27/2022   CHOLHDL 3.2 06/27/2022  -She is on Crestor 5 mg daily without side effects  Orders Placed This Encounter  Procedures   Amb Referral to DSME/T   POCT glycosylated hemoglobin (Hb A1C)   Total time spent for the visit: 40 min, in reviewing her pump downloads, discussing her hyper-glycemic episodes, reviewing previous labs and pump settings and developing a plan to avoid hypo- and hyper-glycemia.   Philemon Kingdom, MD PhD [facility] Endocrinology

## 2022-07-21 ENCOUNTER — Telehealth: Payer: Self-pay

## 2022-07-21 NOTE — Telephone Encounter (Addendum)
Patient Advocate Encounter   Received notification from CoverMyMeds that prior authorization is required for Omnipod 5 G6. PA submitted and APPROVED on 07/21/2022.  Key [id number]  Effective: 07/21/2022 - 07/22/2023  Georgia Duff Rx Patient Advocate Specialist Phone: [phone]

## 2022-07-21 NOTE — Telephone Encounter (Signed)
Patient Advocate Encounter   Received notification from CoverMyMeds that prior authorization is required for Omnipod 5 G6 Intro Kit. PA submitted and APPROVED on 07/21/2022.  Key [id number]  Effective: 07/21/2022 - 07/22/2023  Clista Bernhardt, CPhT Rx Patient Advocate Specialist Phone: [phone]

## 2022-07-25 ENCOUNTER — Ambulatory Visit: Payer: 59 | Admitting: Dermatology

## 2022-08-01 ENCOUNTER — Telehealth: Payer: Self-pay | Admitting: Nutrition

## 2022-08-01 DIAGNOSIS — E1165 Type 2 diabetes mellitus with hyperglycemia: Secondary | ICD-10-CM

## 2022-08-01 NOTE — Telephone Encounter (Signed)
Patient wanting to start on the [place] 5. I scheduled her for next Tuesday the 19th.  She is needing a Education officer, community.  Says she is out.

## 2022-08-02 MED ORDER — DEXCOM G6 TRANSMITTER MISC: 1.0000 | 3 refills | Status: DC

## 2022-08-02 NOTE — Telephone Encounter (Signed)
Rx sent to local pharmacy.  

## 2022-08-09 ENCOUNTER — Encounter: Payer: 59 | Attending: Internal Medicine | Admitting: Nutrition

## 2022-08-09 DIAGNOSIS — E1165 Type 2 diabetes mellitus with hyperglycemia: Secondary | ICD-10-CM | POA: Insufficient documentation

## 2022-08-10 NOTE — Progress Notes (Signed)
Patient is her with her daughter was trained on the use of the OmniPod 5 pump.  The dexcom was linked to her PDM and the PDM was linked to [place].  Settings were transferred from her Dash:  Basal :  MN: 1.2, 3 am: 0.9 , 6AM: 0.7, 9AM: 1.1,  9PM: 1.2.   I/C: MN: 14, 8PM: 8,  Target: 120, with corrections over 120,  timing 6 hours,   she was trained on how to give a bolus and respond to alerts and alarms.   She filled a pod with U-500 R insulin .  We discussed proper pod placement with reference to her Dexcom and she reported good understanding of this.  She attached the pod to her right abdomen without difficulty.    All topics were covered on the pump checklist, and she signed this indicating understanding of all of them with no final questions.

## 2022-08-10 NOTE — Patient Instructions (Signed)
Read over starter booklet and manual  Call Dexcom if having trouble with linking to PDM Call omnipod if having questions about pump

## 2022-08-11 ENCOUNTER — Telehealth: Payer: Self-pay | Admitting: Nutrition

## 2022-08-11 NOTE — Telephone Encounter (Signed)
Patient reported no difficulty using the OP 5 PDM.  Says FBS today was 137 today and has had no problems with this unit. She had no questions for me at this time.

## 2022-08-29 ENCOUNTER — Other Ambulatory Visit: Payer: Self-pay | Admitting: Internal Medicine

## 2022-08-29 DIAGNOSIS — E1142 Type 2 diabetes mellitus with diabetic polyneuropathy: Secondary | ICD-10-CM

## 2022-08-29 NOTE — Telephone Encounter (Signed)
Name of Medication: Hydrocodone Name of Pharmacy: [facility] or Written Date and Quantity: 06-26-22 #60 Last Office Visit and Type: 05-12-22 Next Office Visit and Type: 03-12-23 Last Controlled Substance Agreement Date: N/A Last UDS:N/A

## 2022-08-30 MED ORDER — HYDROCODONE-ACETAMINOPHEN 5-325 MG PO TABS: 1.0000 | ORAL_TABLET | ORAL | 0 refills | Status: DC | PRN

## 2022-08-31 ENCOUNTER — Telehealth: Payer: Self-pay | Admitting: Internal Medicine

## 2022-08-31 NOTE — Telephone Encounter (Signed)
  Encourage patient to contact the pharmacy for refills or they can request refills through [facility]  Did the patient contact the pharmacy: No  LAST APPOINTMENT DATE: 05/12/2022  NEXT APPOINTMENT DATE: 03/12/2023  MEDICATION: sucralfate (CARAFATE) 1 g tablet  Is the patient out of medication? Yes, have been out for a few week and stomach pain  Is this a 90 day supply: Would like a 90 day supply sent to: [facility] ([place]: [place], [place] - [address] [place]  Let patient know to contact pharmacy at the end of the day to make sure medication is ready.  Please notify patient to allow 48-72 hours to process

## 2022-09-01 ENCOUNTER — Telehealth: Payer: Self-pay | Admitting: Internal Medicine

## 2022-09-01 DIAGNOSIS — E1142 Type 2 diabetes mellitus with diabetic polyneuropathy: Secondary | ICD-10-CM

## 2022-09-01 MED ORDER — OMNIPOD 5 DEXG7G6 PODS GEN 5 MISC: 1.0000 | 3 refills | Status: DC

## 2022-09-01 NOTE — Telephone Encounter (Signed)
Rx sent to preferred pharmacy.

## 2022-09-01 NOTE — Telephone Encounter (Signed)
MEDICATION: Omni Pod 5 G6 Pod (gap RX)  PHARMACY:  [facility] in [place], [place] CONTACTED THEIR PHARMACY?  Supply company is shipping her pods tomorrow but will need a gap RX before she receives her shipment  IS THIS A 90 DAY SUPPLY : no  IS PATIENT OUT OF MEDICATION:   IF NOT; HOW MUCH IS LEFT: 1   LAST APPOINTMENT DATE: '@10'$ /08/2022  NEXT APPOINTMENT DATE:'@1'$ /02/2023  DO WE HAVE YOUR PERMISSION TO LEAVE A DETAILED MESSAGE?: yes  OTHER COMMENTS:    **Let patient know to contact pharmacy at the end of the day to make sure medication is ready. **  ** Please notify patient to allow 48-72 hours to process**  **Encourage patient to contact the pharmacy for refills or they can request refills through [facility]**

## 2022-09-04 MED ORDER — SUCRALFATE 1 G PO TABS: 1.0000 g | ORAL_TABLET | Freq: Three times a day (TID) | ORAL | 3 refills | Status: DC

## 2022-09-04 NOTE — Telephone Encounter (Signed)
Rx sent electronically.  

## 2022-09-04 NOTE — Addendum Note (Signed)
Addended by: Pilar Grammes on: 09/04/2022 05:36 PM   Modules accepted: Orders

## 2022-09-13 ENCOUNTER — Ambulatory Visit: Payer: 59 | Admitting: Dermatology

## 2022-09-13 DIAGNOSIS — L409 Psoriasis, unspecified: Secondary | ICD-10-CM

## 2022-09-13 DIAGNOSIS — L853 Xerosis cutis: Secondary | ICD-10-CM

## 2022-09-13 DIAGNOSIS — L820 Inflamed seborrheic keratosis: Secondary | ICD-10-CM | POA: Diagnosis not present

## 2022-09-13 DIAGNOSIS — L821 Other seborrheic keratosis: Secondary | ICD-10-CM

## 2022-09-13 DIAGNOSIS — L814 Other melanin hyperpigmentation: Secondary | ICD-10-CM

## 2022-09-13 DIAGNOSIS — L578 Other skin changes due to chronic exposure to nonionizing radiation: Secondary | ICD-10-CM | POA: Diagnosis not present

## 2022-09-13 MED ORDER — CLOBETASOL PROPIONATE 0.05 % EX SOLN: CUTANEOUS | 1 refills | Status: DC

## 2022-09-13 NOTE — Patient Instructions (Addendum)
Recommend OTC Gold Bond Rapid Relief Anti-Itch cream (pramoxine + menthol), CeraVe Anti-itch cream or lotion (pramoxine), Sarna lotion (Original- menthol + camphor or Sensitive- pramoxine) or Eucerin 12 hour Itch Relief lotion (menthol) up to 3 times per day to areas on body that are itchy.   Eczema Skin Care  Buy TWO 16oz jars of CeraVe moisturizing cream  [facility], [facility], [facility] (no prescription needed)  Costs about $15 per jar   Jar #1: Use as a moisturizer as needed. Can be applied to any area of the body. Use twice daily to unaffected areas.  Jar #2: Pour one 36m bottle of clobetasol 0.05% solution into jar, mix well. Label this jar to indicate the medication has been added. Use twice daily to affected areas. Do not apply to face, groin or underarms.  Moisturizer may burn or sting initially. Try for at least 4 weeks.    Gentle Skin Care Guide  1. Bathe no more than once a day.  2. Avoid bathing in hot water  3. Use a mild soap like Dove, Vanicream, Cetaphil, CeraVe. Can use Lever 2000 or Cetaphil antibacterial soap  4. Use soap only where you need it. On most days, use it under your arms, between your legs, and on your feet. Let the water rinse other areas unless visibly dirty.  5. When you get out of the bath/shower, use a towel to gently blot your skin dry, don't rub it.  6. While your skin is still a little damp, apply a moisturizing cream such as Vanicream, CeraVe, Cetaphil, Eucerin, Sarna lotion or plain Vaseline Jelly. For hands apply Neutrogena [place]Hand Cream or Excipial Hand Cream.  7. Reapply moisturizer any time you start to itch or feel dry.  8. Sometimes using free and clear laundry detergents can be helpful. Fabric softener sheets should be avoided. Downy Free & Gentle liquid, or any liquid fabric softener that is free of dyes and perfumes, it acceptable to use  9. If your doctor has given you prescription creams you may apply moisturizers over them     Due to recent changes in healthcare laws, you may see results of your pathology and/or laboratory studies on MyChart before the doctors have had a chance to review them. We understand that in some cases there may be results that are confusing or concerning to you. Please understand that not all results are received at the same time and often the doctors may need to interpret multiple results in order to provide you with the best plan of care or course of treatment. Therefore, we ask that you please give u[place]2 business days to thoroughly review all your results before contacting the office for clarification. Should we see a critical lab result, you will be contacted sooner.   If You Need Anything After Your Visit  If you have any questions or concerns for your doctor, please call our main line at [phone] press option 4 to reach your doctor's medical assistant. If no one answers, please leave a voicemail as directed and we will return your call as soon as possible. Messages left after 4 pm will be answered the following business day.   You may also send [place] message via [place] We typically respond to MyChart messages within 1-2 business days.  For prescription refills, please ask your pharmacy to contact our office. Our fax number is [phone]  If you have an urgent issue when the clinic is closed that cannot wait until the next business  day, you can page your doctor at the number below.    Please note that while we do our best to be available for urgent issues outside of office hours, we are not available 24/7.   If you have an urgent issue and are unable to reach [place], you may choose to seek medical care at your doctor's office, retail clinic, urgent care center, or emergency room.  If you have a medical emergency, please immediately call 911 or go to the emergency department.  Pager Numbers  - Dr. Nehemiah Massed: [phone]  - Dr. Laurence Ferrari: [phone]  - Dr. Nicole Kindred:  [phone]  In the event of inclement weather, please call our main line at [phone] for an update on the status of any delays or closures.  Dermatology Medication Tips: Please keep the boxes that topical medications come in in order to help keep track of the instructions about where and how to use these. Pharmacies typically print the medication instructions only on the boxes and not directly on the medication tubes.   If your medication is too expensive, please contact our office at [phone] option 4 or send [place] a message through [place].   We are unable to tell what your co-pay for medications will be in advance as this is different depending on your insurance coverage. However, we may be able to find a substitute medication at lower cost or fill out paperwork to get insurance to cover a needed medication.   If a prior authorization is required to get your medication covered by your insurance company, please allow [place] 1-2 business days to complete this process.  Drug prices often vary depending on where the prescription is filled and some pharmacies may offer cheaper prices.  The website www.goodrx.com contains coupons for medications through different pharmacies. The prices here do not account for what the cost may be with help from insurance (it may be cheaper with your insurance), but the website can give you the price if you did not use any insurance.  - You can print the associated coupon and take it with your prescription to the pharmacy.  - You may also stop by our office during regular business hours and pick up a GoodRx coupon card.  - If you need your prescription sent electronically to a different pharmacy, notify our office through [facility] or by phone at [phone] option 4.     Si Usted Necesita Algo Despus de Su Visita  Tambin puede enviarnos un mensaje a travs de Pharmacist, community. Por lo general respondemos a los mensajes de MyChart en el transcurso de 1 a 2  das hbiles.  Para renovar recetas, por favor pida a su farmacia que se ponga en contacto con nuestra oficina. Harland Dingwall de fax es [place] [phone].  Si tiene un asunto urgente cuando la clnica est cerrada y que no puede esperar hasta el siguiente da hbil, puede llamar/localizar a su doctor(a) al nmero que aparece a continuacin.   Por favor, tenga en cuenta que aunque hacemos todo lo posible para estar disponibles para asuntos urgentes fuera del horario de [place], no estamos disponibles las 24 horas del da, los 7 das de la [place].   Si tiene un problema urgente y no puede comunicarse con nosotros, puede optar por buscar atencin mdica  en el consultorio de su doctor(a), en una clnica privada, en un centro de atencin urgente o en una sala de emergencias.  Si tiene una emergencia mdica, por favor llame inmediatamente al 911 o vaya  a la sala de Multimedia programmer.  Nmeros de bper  - Dr. Nehemiah Massed: [phone]  - Dra. Moye: [phone]  - Dra. Nicole Kindred: [phone]  En caso de inclemencias del [place], por favor llame a Johnsie Kindred principal al [phone] para una actualizacin sobre el [place] de cualquier retraso o cierre.  Consejos para la medicacin en dermatologa: Por favor, guarde las cajas en las que vienen los medicamentos de uso tpico para ayudarle a seguir las instrucciones sobre dnde y cmo usarlos. Las farmacias generalmente imprimen las instrucciones del medicamento slo en las cajas y no directamente en los tubos del [place].   Si su medicamento es muy caro, por favor, pngase en contacto con Zigmund Daniel llamando al [phone] y presione la opcin 4 o envenos un mensaje a travs de Pharmacist, community.   No podemos decirle cul ser su copago por los medicamentos por adelantado ya que esto es diferente dependiendo de la cobertura de su seguro. Sin embargo, es posible que podamos encontrar un medicamento sustituto a Electrical engineer un formulario para que el  seguro cubra el medicamento que se considera necesario.   Si se requiere una autorizacin previa para que su compaa de seguros [place] su medicamento, por favor permtanos de 1 a 2 das hbiles para completar este proceso.  Los precios de los medicamentos varan con frecuencia dependiendo del Environmental consultant de dnde se surte la receta y alguna farmacias pueden ofrecer precios ms baratos.  El sitio web www.goodrx.com tiene cupones para medicamentos de Airline pilot. Los precios aqu no tienen en cuenta lo que podra costar con la ayuda del seguro (puede ser ms barato con su seguro), pero el sitio web puede darle el precio si no utiliz Research scientist (physical sciences).  - Puede imprimir el cupn correspondiente y llevarlo con su receta a la farmacia.  - Tambin puede pasar por nuestra oficina durante el horario de atencin regular y Charity fundraiser una tarjeta de cupones de GoodRx.  - Si necesita que su receta se enve electrnicamente a una farmacia diferente, informe a nuestra oficina a travs de MyChart de [place] o por telfono llamando al [phone] y presione la opcin 4.

## 2022-09-13 NOTE — Progress Notes (Signed)
Follow-Up Visit   Subjective  Shannon [name] is a 66 y.o. female who presents for the following: Skin tags (Bilateral axilla, hits with shaving.) and rough spots (Bilateral legs and back, itchy).  The following portions of the chart were reviewed this encounter and updated as appropriate:       Review of Systems:  No other skin or systemic complaints except as noted in HPI or Assessment and Plan.  Objective  Well appearing patient in no apparent distress; mood and affect are within normal limits.  A focused examination was performed including face, trunk, arms, legs. Relevant physical exam findings are noted in the Assessment and Plan.  L axilla x 7, R post shoulder x 1, R lat knee x 3, R lat thigh x 1, R inguinal crease x 1, R axilla x 6, upper abdomen x 8 (27) Erythematous stuck-on, waxy papule  back Diffuse xerotic patches with mild erythema     Assessment & Plan  Seborrheic Keratoses - Stuck-on, waxy, tan-brown papules and/or plaques  - Benign-appearing - Discussed benign etiology and prognosis. - Observe - Call for any changes  Actinic Damage - chronic, secondary to cumulative UV radiation exposure/sun exposure over time - diffuse scaly erythematous macules with underlying dyspigmentation - Recommend daily broad spectrum sunscreen SPF 30+ to sun-exposed areas, reapply every 2 hours as needed.  - Recommend staying in the shade or wearing long sleeves, sun glasses (UVA+UVB protection) and wide brim hats (4-inch brim around the entire circumference of the hat). - Call for new or changing lesions.  Lentigines - Scattered tan macules - Due to sun exposure - Benign-appearing, observe - Recommend daily broad spectrum sunscreen SPF 30+ to sun-exposed areas, reapply every 2 hours as needed. - Call for any changes  Inflamed seborrheic keratosis (27) L axilla x 7, R post shoulder x 1, R lat knee x 3, R lat thigh x 1, R inguinal crease x 1, R axilla x 6, upper abdomen x  8  Symptomatic, irritating, patient would like treated.  Destruction of lesion - L axilla x 7, R post shoulder x 1, R lat knee x 3, R lat thigh x 1, R inguinal crease x 1, R axilla x 6, upper abdomen x 8  Destruction method: cryotherapy   Informed consent: discussed and consent obtained   Lesion destroyed using liquid nitrogen: Yes   Region frozen until ice ball extended beyond lesion: Yes   Outcome: patient tolerated procedure well with no complications   Post-procedure details: wound care instructions given   Additional details:  Prior to procedure, discussed risks of blister formation, small wound, skin dyspigmentation, or rare scar following cryotherapy. Recommend Vaseline ointment to treated areas while healing.   Xerosis cutis back  With pruritus  Recommend gentle, hydrating skin care. Gentle skin care handout given  Start Clobetasol solution/CeraVe Cream mix apply to back BID until itch improved up to 4 weeks dsp 48m 1Rf. Avoid f/g/a Topical steroids (such as triamcinolone, fluocinolone, fluocinonide, mometasone, clobetasol, halobetasol, betamethasone, hydrocortisone) can cause thinning and lightening of the skin if they are used for too long in the same area. Your physician has selected the right strength medicine for your problem and area affected on the body. Please use your medication only as directed by your physician to prevent side effects.    Psoriasis  Related Medications Fluocinolone Acetonide 0.01 % OIL Apply 1-2 gtts to ears once to twice daily as needed until improved.  clobetasol (TEMOVATE) 0.05 % external solution Patient  to mix in jar of CeraVe Cream and apply to back twice daily until itch improved. Avoid face, groin, axilla.   Return if symptoms worsen or fail to improve.  IJamesetta Orleans, CMA, am acting as scribe for Brendolyn Patty, MD .  Documentation: I have reviewed the above documentation for accuracy and completeness, and I agree with the  above.  Brendolyn Patty MD

## 2022-09-20 ENCOUNTER — Other Ambulatory Visit: Payer: Self-pay | Admitting: Internal Medicine

## 2022-10-03 ENCOUNTER — Other Ambulatory Visit: Payer: Self-pay | Admitting: Internal Medicine

## 2022-10-04 ENCOUNTER — Other Ambulatory Visit: Payer: Self-pay | Admitting: Internal Medicine

## 2022-10-05 ENCOUNTER — Encounter: Payer: Self-pay | Admitting: Internal Medicine

## 2022-10-05 ENCOUNTER — Encounter: Payer: Self-pay | Admitting: *Deleted

## 2022-10-05 ENCOUNTER — Ambulatory Visit: Payer: 59 | Admitting: Internal Medicine

## 2022-10-05 ENCOUNTER — Other Ambulatory Visit: Payer: Self-pay

## 2022-10-05 VITALS — BP 120/60 | HR 85 | Temp 98.1°F | Ht 64.0 in | Wt 173.0 lb

## 2022-10-05 DIAGNOSIS — R1011 Right upper quadrant pain: Secondary | ICD-10-CM | POA: Diagnosis not present

## 2022-10-05 DIAGNOSIS — L4050 Arthropathic psoriasis, unspecified: Secondary | ICD-10-CM

## 2022-10-05 DIAGNOSIS — R5383 Other fatigue: Secondary | ICD-10-CM | POA: Diagnosis not present

## 2022-10-05 DIAGNOSIS — F39 Unspecified mood [affective] disorder: Secondary | ICD-10-CM

## 2022-10-05 DIAGNOSIS — E1165 Type 2 diabetes mellitus with hyperglycemia: Secondary | ICD-10-CM

## 2022-10-05 DIAGNOSIS — Z23 Encounter for immunization: Secondary | ICD-10-CM | POA: Diagnosis not present

## 2022-10-05 DIAGNOSIS — G479 Sleep disorder, unspecified: Secondary | ICD-10-CM

## 2022-10-05 LAB — COMPREHENSIVE METABOLIC PANEL
ALT: 32 U/L (ref 0–35)
AST: 30 U/L (ref 0–37)
Albumin: 4.1 g/dL (ref 3.5–5.2)
Alkaline Phosphatase: 109 U/L (ref 39–117)
BUN: 22 mg/dL (ref 6–23)
CO2: 32 mEq/L (ref 19–32)
Calcium: 9.4 mg/dL (ref 8.4–10.5)
Chloride: 100 mEq/L (ref 96–112)
Creatinine, Ser: 0.85 mg/dL (ref 0.40–1.20)
GFR: 72.28 mL/min (ref >60.00)
Glucose, Bld: 146 mg/dL — ABNORMAL HIGH (ref 70–99)
Potassium: 4 mEq/L (ref 3.5–5.1)
Sodium: 141 mEq/L (ref 135–145)
Total Bilirubin: 0.3 mg/dL (ref 0.2–1.2)
Total Protein: 6.8 g/dL (ref 6.0–8.3)

## 2022-10-05 LAB — CBC
HCT: 39 % (ref 36.0–46.0)
Hemoglobin: 12.2 g/dL (ref 12.0–15.0)
MCHC: 31.3 g/dL (ref 30.0–36.0)
MCV: 74.2 fl — ABNORMAL LOW (ref 78.0–100.0)
Platelets: 341 10*3/uL (ref 150.0–400.0)
RBC: 5.26 Mil/uL — ABNORMAL HIGH (ref 3.87–5.11)
RDW: 17.9 % — ABNORMAL HIGH (ref 11.5–15.5)
WBC: 7.8 10*3/uL (ref 4.0–10.5)

## 2022-10-05 LAB — VITAMIN B12: Vitamin B-12: 402 pg/mL (ref 211–911)

## 2022-10-05 LAB — TSH: TSH: 0.57 u[IU]/mL (ref 0.35–5.50)

## 2022-10-05 LAB — SEDIMENTATION RATE: Sed Rate: 36 mm/hr — ABNORMAL HIGH (ref 0–30)

## 2022-10-05 MED ORDER — HUMULIN R U-500 (CONCENTRATED) 500 UNIT/ML ~~LOC~~ SOLN: SUBCUTANEOUS | 1 refills | Status: DC

## 2022-10-05 NOTE — Progress Notes (Signed)
Subjective:    Patient ID: Shannon [name], female    DOB: 01-04-1956, 66 y.o.   MRN: [id number]  HPI Here with several concerns  "I am not me" My pain levels are worse Hard to just do everyday activities---has to cut errands short Needs nap during the day Disturbed by weight gain  Tired upon awakening at night Bad snoring--unsure about apnea  Some upper chest pain--not bad Just at rest No heartburn  Taltz now---worked okay every 2 weeks, but not when she has spread it out  Some mood issues---lost sister and other family members Feels the duloxetine has helped  Current Outpatient Medications on File Prior to Visit  Medication Sig Dispense Refill   aspirin-acetaminophen-caffeine (EXCEDRIN MIGRAINE) 250-250-65 MG tablet Take 1 tablet by mouth every 6 (six) hours as needed for headache.     Blood Glucose Monitoring Suppl (FIFTY50 GLUCOSE METER 2.0) w/Device KIT OneTouch Ultra2 Meter kit     cetirizine (ZYRTEC) 10 MG tablet Take 10 mg by mouth daily.      clobetasol (TEMOVATE) 0.05 % external solution Patient to mix in jar of CeraVe Cream and apply to back twice daily until itch improved. Avoid face, groin, axilla. 50 mL 1   Continuous Blood Gluc Receiver (DEXCOM G6 RECEIVER) DEVI USE AS INSTRUCTED TO CHECK BLOOD SUGAR 1 each 0   Continuous Blood Gluc Sensor (DEXCOM G6 SENSOR) MISC CHANGE 1 SENSOR EVERY 10  DAYS AS DIRECTED 9 each 3   Continuous Blood Gluc Transmit (DEXCOM G6 TRANSMITTER) MISC 1 Device by Does not apply route every 3 (three) months. 1 each 3   docusate sodium (COLACE) 100 MG capsule Take 300 mg by mouth at bedtime.      DULoxetine (CYMBALTA) 30 MG capsule Take 1 capsule (30 mg total) by mouth daily. 90 capsule 3   fluconazole (DIFLUCAN) 150 MG tablet Take 1 tablet (150 mg total) by mouth once a week. 2 tablet 1   Fluocinolone Acetonide 0.01 % OIL Apply 1-2 gtts to ears once to twice daily as needed until improved. 20 mL 2   fluticasone (FLONASE) 50 MCG/ACT nasal  spray Place 1 spray into both nostrils daily.     furosemide (LASIX) 20 MG tablet TAKE 1 TABLET BY MOUTH DAILY 100 tablet 3   Glucagon 3 MG/DOSE POWD Place 3 mg into the nose once as needed for up to 1 dose. 1 each 11   HUMULIN R 500 UNIT/ML injection USE UP TO 200 UNITS OF CONCENTRATED INSULIN IN THE INSULIN PUMP DAILY 20 mL 11   HYDROcodone-acetaminophen (NORCO/VICODIN) 5-325 MG tablet Take 1 tablet by mouth every 4 (four) hours as needed for moderate pain. 60 tablet 0   Insulin Disposable Pump (OMNIPOD 5 G6 POD, GEN 5,) MISC 1 each by Does not apply route every 3 (three) days. 5 each 3   JARDIANCE 10 MG TABS tablet TAKE 1 TABLET BY MOUTH DAILY  BEFORE BREAKFAST 90 tablet 3   LUMIGAN 0.01 % SOLN Place 1 drop into both eyes at bedtime.      metoprolol succinate (TOPROL-XL) 25 MG 24 hr tablet TAKE 1 AND 1/2 TABLETS BY  MOUTH DAILY 135 tablet 3   ondansetron (ZOFRAN-ODT) 4 MG disintegrating tablet DISSOLVE 1 TABLET ON THE TONGUE  EVERY 6 HOURS AS NEEDED FOR  NAUSEA OR VOMITING 180 tablet 0   ONETOUCH DELICA LANCETS FINE MISC Check blood sugar once daily and as needed. 250.00 100 each 1   ONETOUCH ULTRA test strip USE TO  CHECK BLOOD SUGAR 2  TIMES DAILY 200 strip 3   pantoprazole (PROTONIX) 40 MG tablet TAKE 1 TABLET BY MOUTH  TWICE DAILY ON AN EMPTY  STOMACH 180 tablet 3   rosuvastatin (CRESTOR) 5 MG tablet Take 1 tablet (5 mg total) by mouth daily. 90 tablet 3   sucralfate (CARAFATE) 1 g tablet Take 1 tablet (1 g total) by mouth in the morning, at noon, and at bedtime. 270 tablet 3   TALTZ 80 MG/ML SOAJ Inject into the skin.     traZODone (DESYREL) 100 MG tablet TAKE 2 TABLETS BY MOUTH  EVERY NIGHT AT BEDTIME 180 tablet 3   triamterene-hydrochlorothiazide (MAXZIDE-25) 37.5-25 MG tablet TAKE 1 TABLET BY MOUTH ONCE DAILY 90 tablet 3   valACYclovir (VALTREX) 1000 MG tablet TAKE 2 TABLETS BY MOUTH  AT ONSET OF SYMPTOMS AS NEEDED FOR FEVER BLISTERS AND TAKE 2 TABLETS 12 HOURS LATER. 90 tablet 0   No  current facility-administered medications on file prior to visit.    Allergies  Allergen Reactions   Humalog [Insulin Lispro] Swelling   Lisinopril Cough   Nitrofurantoin Other (See Comments)    Pt unsure   Oxycodone-Acetaminophen Itching   Percocet [Oxycodone-Acetaminophen]    Atorvastatin Diarrhea    May have caused colitis--but not clear cut   Clarithromycin Palpitations   Glipizide Other (See Comments)    Sugars too variable   Invokana [Canagliflozin]     Nausea, constipation    Past Medical History:  Diagnosis Date   Actinic keratosis    Allergy    Arthritis    hip, back, shoulders, joints   Asthma    Complication of anesthesia    problems with block during breast augmentation and epidural during childbirth   Depression    Diabetes mellitus 09/2011   type 2   Family history of adverse reaction to anesthesia    PROBLEMS WITH BLOCKS   Fatty liver    GERD (gastroesophageal reflux disease)    Glaucoma suspect    Headache    migraines-4x a week headache-1x week migraine   History of hiatal hernia    Hyperlipidemia    Hypertension    Controlled on meds   Mitral valve prolapse    Nose colonized with MRSA 05/15/2018   Osteopenia    Pneumonia    PONV (postoperative nausea and vomiting)    Psoriasis    Shingles 11/2006   Sleep disturbance    Tachycardia    Vertigo    Hx of    Past Surgical History:  Procedure Laterality Date   ABDOMINAL HYSTERECTOMY  2000   /BSO - abn. Paps   APPENDECTOMY  1978   AUGMENTATION MAMMAPLASTY Bilateral 1981   BLADDER REPAIR     x2   BREAST ENHANCEMENT SURGERY  1987   CARDIAC CATHETERIZATION     [facility]   CARDIAC CATHETERIZATION Left 12/18/2016   Procedure: Left Heart Cath and Coronary Angiography;  Surgeon: Wellington Hampshire, MD;  Location: [place] [facility];  Service: Cardiovascular;  Laterality: Left;   CARPAL TUNNEL RELEASE Right    CERVICAL DISCECTOMY  11/2009   with synthetic discs inserted--  Dr Arnoldo Morale   COLONOSCOPY      JOINT REPLACEMENT     NM MYOVIEW LTD  03/2005   EF 56%, no ischemia   RECTAL PROLAPSE REPAIR     x1   SHOULDER ARTHROSCOPY WITH OPEN ROTATOR CUFF REPAIR Left 12/29/2019   Procedure: LEFT SHOULDER ARTHROSCOPIC DISTAL CLAVICLE EXCISISON, SUBACROMIAL DECOMPRESSION,BICEPS TENOTOMY,ROTATOR  CUFF REPAIR ;  Surgeon: Lovell Sheehan, MD;  Location: [facility];  Service: Orthopedics;  Laterality: Left;   SHOULDER CLOSED REDUCTION Left 05/03/2020   Procedure: Left shoulder arthroscopic lysis of adhesions and manipulation under anesthesia;  Surgeon: Lovell Sheehan, MD;  Location: [facility];  Service: Orthopedics;  Laterality: Left;   SHOULDER SURGERY  09/2008, 01/2009   Right shoulder surgery, then redone with lysis of adhesions   TOTAL HIP ARTHROPLASTY Right 05/29/2018   Procedure: TOTAL HIP ARTHROPLASTY ANTERIOR APPROACH;  Surgeon: Lovell Sheehan, MD;  Location: [facility];  Service: Orthopedics;  Laterality: Right;   UPPER GI ENDOSCOPY      Family History  Problem Relation Age of Onset   Diabetes Mother    Heart disease Mother    Hypertension Mother    Ovarian cancer Mother    Melanoma Mother    Cancer Father    Coronary artery disease Father    Heart disease Father    Diabetes Sister    Depression Sister    Coronary artery disease Sister    Heart disease Sister    Pulmonary Hypertension Sister    Rheum arthritis Maternal Aunt    Heart attack Paternal Uncle    Heart attack Paternal Uncle    Breast cancer Maternal Grandmother    Diabetes Maternal Grandmother    Diabetes Maternal Grandfather    Heart attack Cousin    Colon cancer Neg Hx     Social History   Socioeconomic History   Marital status: Married    Spouse name: Not on file   Number of children: 5   Years of education: Not on file   Highest education level: Not on file  Occupational History   Occupation: homemaker  Tobacco Use   Smoking status: Never    Passive exposure: Past   Smokeless tobacco: Never  Vaping Use    Vaping Use: Never used  Substance and Sexual Activity   Alcohol use: No   Drug use: No   Sexual activity: Not on file  Other Topics Concern   Not on file  Social History Narrative   Home maker   Social Determinants of Health   Financial Resource Strain: Not on file  Food Insecurity: Not on file  Transportation Needs: Not on file  Physical Activity: Not on file  Stress: Not on file  Social Connections: Not on file  Intimate Partner Violence: Not on file   Review of Systems No SOB Appetite okay Bowels are light colored Some abdominal pain---carafate has helped that No dysuria Now with leg pains---that is new Has awakened with nausea at times     Objective:   Physical Exam Constitutional:      Appearance: Normal appearance.  Cardiovascular:     Rate and Rhythm: Normal rate and regular rhythm.     Heart sounds: No murmur heard.    No gallop.  Pulmonary:     Effort: Pulmonary effort is normal.     Breath sounds: Normal breath sounds. No wheezing or rales.  Abdominal:     Palpations: Abdomen is soft.     Comments: Mild RUQ tenderness---positive Murphy's  Musculoskeletal:     Cervical back: Neck supple.     Right lower leg: No edema.     Left lower leg: No edema.  Lymphadenopathy:     Cervical: No cervical adenopathy.  Neurological:     Mental Status: She is alert.  Psychiatric:        Mood and  Affect: Mood normal.            Assessment & Plan:

## 2022-10-05 NOTE — Assessment & Plan Note (Signed)
Certainly suspicious for gallbladder disease Will check ultrasound

## 2022-10-05 NOTE — Assessment & Plan Note (Signed)
Taltz helped when every 2 weeks---asked her to call Dr Posey Pronto to review this

## 2022-10-05 NOTE — Assessment & Plan Note (Addendum)
Almost certainly multifactorial History is suggestive of sleep apnea---will set up for sleep eval Doubt the taltz is the problem Will check labs also  Has left temporal headache---will check sed rate also (but not really consistent with GCA)

## 2022-10-05 NOTE — Assessment & Plan Note (Signed)
Doesn't seem to be the primary cause Will not increase the duloxetine for now

## 2022-10-05 NOTE — Addendum Note (Signed)
Addended by: Pilar Grammes on: 10/05/2022 04:24 PM   Modules accepted: Orders

## 2022-10-06 ENCOUNTER — Ambulatory Visit
Admission: RE | Admit: 2022-10-06 | Discharge: 2022-10-06 | Disposition: A | Discharge status: Home / Self Care | Payer: 59 | Source: Ambulatory Visit | Attending: Internal Medicine | Admitting: Internal Medicine

## 2022-10-06 DIAGNOSIS — R1011 Right upper quadrant pain: Secondary | ICD-10-CM | POA: Diagnosis present

## 2022-10-15 ENCOUNTER — Other Ambulatory Visit: Payer: Self-pay | Admitting: Internal Medicine

## 2022-10-16 ENCOUNTER — Other Ambulatory Visit: Payer: Self-pay

## 2022-10-16 DIAGNOSIS — E1165 Type 2 diabetes mellitus with hyperglycemia: Secondary | ICD-10-CM

## 2022-10-16 MED ORDER — OMNIPOD 5 DEXG7G6 PODS GEN 5 MISC: 1.0000 | 3 refills | Status: DC

## 2022-10-16 MED ORDER — DEXCOM G6 TRANSMITTER MISC: 1.0000 | 1 refills | Status: DC

## 2022-10-19 ENCOUNTER — Ambulatory Visit (INDEPENDENT_AMBULATORY_CARE_PROVIDER_SITE_OTHER): Payer: 59 | Admitting: Adult Health

## 2022-10-19 ENCOUNTER — Other Ambulatory Visit: Payer: Self-pay

## 2022-10-19 ENCOUNTER — Telehealth: Payer: Self-pay | Admitting: Pharmacy Technician

## 2022-10-19 ENCOUNTER — Other Ambulatory Visit (HOSPITAL_COMMUNITY): Payer: Self-pay

## 2022-10-19 ENCOUNTER — Encounter: Payer: Self-pay | Admitting: Adult Health

## 2022-10-19 VITALS — BP 130/70 | HR 95 | Temp 98.0°F | Ht 64.0 in | Wt 172.6 lb

## 2022-10-19 DIAGNOSIS — E1142 Type 2 diabetes mellitus with diabetic polyneuropathy: Secondary | ICD-10-CM

## 2022-10-19 DIAGNOSIS — E1165 Type 2 diabetes mellitus with hyperglycemia: Secondary | ICD-10-CM

## 2022-10-19 DIAGNOSIS — R0683 Snoring: Secondary | ICD-10-CM | POA: Diagnosis not present

## 2022-10-19 MED ORDER — DEXCOM G6 TRANSMITTER MISC: 1.0000 | 1 refills | Status: DC

## 2022-10-19 MED ORDER — OMNIPOD 5 DEXG7G6 PODS GEN 5 MISC: 1.0000 | 3 refills | Status: DC

## 2022-10-19 NOTE — Telephone Encounter (Signed)
Pt called to advised she is having to switch her Omnipods every other day and continues to run out of pods early. Pt requested an updated rx be sent to the pharmacy.

## 2022-10-19 NOTE — Telephone Encounter (Addendum)
Pharmacy Patient Advocate Encounter   Received notification from RMA that prior authorization for Omnipod 5 is required/requested. (45 pods per 90 days)   PA submitted on 10/19/22 to [employer] via CoverMyMeds Key  [id number] Status is pending   Received: Today [name], Shannon, RMA  P Rx Prior Auth Team Pt advised a PA was needed for her to receive [place] instead of the standard 30 because she is switching pods every 2 days instead of 3. Can we initiate a prior auth. Thank you.

## 2022-10-19 NOTE — Patient Instructions (Signed)
Set up for home sleep study.  Work on healthy weight loss  Do not drive if sleepy  Healthy sleep regimen  Follow up in 6-8 weeks to discuss results and treatment plan .   

## 2022-10-19 NOTE — Progress Notes (Signed)
Reviewed and agree with assessment/plan.   Chesley Mires, MD [facility] Pulmonary/Critical Care 10/19/2022, 2:12 PM Pager:  [phone]

## 2022-10-19 NOTE — Progress Notes (Signed)
_0  ID: Shannon [name], female    DOB: [birth year], 66 y.o.   MRN: [id number]  Chief Complaint  Patient presents with   sleep consult    Referring provider: Venia Carbon, MD  HPI: 66 year old female seen for sleep consult October 19, 2022 for snoring, daytime sleepiness and restless sleep  Medical history significant for T2DM on Insulin pump , Psoriaatirc Athritis and Spon    TEST/EVENTS :   10/19/2022 Sleep consult  Patient presents for a sleep consult today.  Kindly referred by primary care provider Dr. Silvio Pate.  Patient complains of loud snoring, restless sleep, daytime sleepiness.  Epworth score is 7 out of 24.  Typically gets sleepy if she sits down to watch TV and in the evening hours.  Typically goes to bed about 11 pm -midnight   Takes about 15 to 20 minutes to go to sleep.  Is up multiple times throughout the night.  Gets up at 8 AM.  Weight is up 30 pounds over the last 2 years.  Current weight is at 172 with a BMI at 29.  Patient has never had a sleep study before.  She has no symptoms suspicious for cataplexy or sleep paralysis.  She has no history of congestive heart failure or stroke.  Caffeine intake 1 cup of coffee .  Takes Melatonin and Trazodone each night for insomnia - it helps a lot.  . Does have chronic pain with arthritis . On Cymbalta and Hydrocondone.   Medical history significant for hypertension, diabetes- Insulin dependent , hyperlipidemia, chronic allergies, chronic headaches, psoriatic arthritis, ankylosing spondylitis  Social history patient is married.  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  She has adult children.  She is a never smoker.  No alcohol.  No drug use.  Family history positive for allergies, rheumatoid arthritis, heart disease, cancer  Surgical history positive for neck surgery, breast surgery, hysterectomy, appendectomy.    Allergies  Allergen Reactions   Humalog [Insulin Lispro] Swelling   Lisinopril Cough   Nitrofurantoin  Other (See Comments)    Pt unsure   Oxycodone-Acetaminophen Itching   Percocet [Oxycodone-Acetaminophen]    Atorvastatin Diarrhea    May have caused colitis--but not clear cut   Clarithromycin Palpitations   Glipizide Other (See Comments)    Sugars too variable   Invokana [Canagliflozin]     Nausea, constipation    Immunization History  Administered Date(s) Administered   Influenza Split 10/19/2011, 09/16/2012   Influenza Whole 09/23/2007, 08/28/2008, 09/09/2009, 09/05/2010   Influenza,inj,Quad PF,6+ Mos 09/22/2014, 08/09/2015, 11/16/2016, 09/03/2018, 09/09/2019, 10/05/2022   Influenza-Unspecified 09/04/2017, 09/03/2020, 10/11/2021   Moderna Sars-Covid-2 Vaccination 08/20/2020, 09/17/2020   Pneumococcal Conjugate-13 06/18/2017   Pneumococcal Polysaccharide-23 09/16/2012, 06/07/2021   Td 11/20/2004   Tdap 05/10/2015   Zoster Recombinat (Shingrix) 12/17/2018, 04/15/2019    Past Medical History:  Diagnosis Date   Actinic keratosis    Allergy    Arthritis    hip, back, shoulders, joints   Asthma    Complication of anesthesia    problems with block during breast augmentation and epidural during childbirth   Depression    Diabetes mellitus 09/2011   type 2   Family history of adverse reaction to anesthesia    PROBLEMS WITH BLOCKS   Fatty liver    GERD (gastroesophageal reflux disease)    Glaucoma suspect    Headache    migraines-4x a week headache-1x week migraine   History of hiatal hernia    Hyperlipidemia    Hypertension  Controlled on meds   Mitral valve prolapse    Nose colonized with MRSA 05/15/2018   Osteopenia    Pneumonia    PONV (postoperative nausea and vomiting)    Psoriasis    Shingles 11/2006   Sleep disturbance    Tachycardia    Vertigo    Hx of    Tobacco History: Social History   Tobacco Use  Smoking Status Never   Passive exposure: Past  Smokeless Tobacco Never   Counseling given: Not Answered   Outpatient Medications Prior to  Visit  Medication Sig Dispense Refill   aspirin-acetaminophen-caffeine (EXCEDRIN MIGRAINE) 250-250-65 MG tablet Take 1 tablet by mouth every 6 (six) hours as needed for headache.     Blood Glucose Monitoring Suppl (FIFTY50 GLUCOSE METER 2.0) w/Device KIT OneTouch Ultra2 Meter kit     cetirizine (ZYRTEC) 10 MG tablet Take 10 mg by mouth daily.      clobetasol (TEMOVATE) 0.05 % external solution Patient to mix in jar of CeraVe Cream and apply to back twice daily until itch improved. Avoid face, groin, axilla. 50 mL 1   Continuous Blood Gluc Sensor (DEXCOM G6 SENSOR) MISC CHANGE 1 SENSOR EVERY 10  DAYS AS DIRECTED 9 each 3   Continuous Blood Gluc Transmit (DEXCOM G6 TRANSMITTER) MISC 1 Device by Does not apply route every 3 (three) months. 1 each 1   docusate sodium (COLACE) 100 MG capsule Take 300 mg by mouth at bedtime.      DULoxetine (CYMBALTA) 30 MG capsule TAKE 1 CAPSULE BY MOUTH DAILY 90 capsule 1   Fluocinolone Acetonide 0.01 % OIL Apply 1-2 gtts to ears once to twice daily as needed until improved. 20 mL 2   fluticasone (FLONASE) 50 MCG/ACT nasal spray Place 1 spray into both nostrils daily.     furosemide (LASIX) 20 MG tablet TAKE 1 TABLET BY MOUTH DAILY 100 tablet 3   Glucagon 3 MG/DOSE POWD Place 3 mg into the nose once as needed for up to 1 dose. 1 each 11   HYDROcodone-acetaminophen (NORCO/VICODIN) 5-325 MG tablet Take 1 tablet by mouth every 4 (four) hours as needed for moderate pain. 60 tablet 0   Insulin Disposable Pump (OMNIPOD 5 G6 POD, GEN 5,) MISC 1 each by Does not apply route every other day. 45 each 3   insulin regular human CONCENTRATED (HUMULIN R) 500 UNIT/ML injection USE UP TO 200 UNITS OF CONCENTRATED INSULIN IN THE INSULIN PUMP DAILY 20 mL 1   JARDIANCE 10 MG TABS tablet TAKE 1 TABLET BY MOUTH DAILY  BEFORE BREAKFAST 90 tablet 3   LUMIGAN 0.01 % SOLN Place 1 drop into both eyes at bedtime.      metoprolol succinate (TOPROL-XL) 25 MG 24 hr tablet TAKE 1 AND 1/2 TABLETS BY   MOUTH DAILY 135 tablet 3   ondansetron (ZOFRAN-ODT) 4 MG disintegrating tablet DISSOLVE 1 TABLET ON THE TONGUE  EVERY 6 HOURS AS NEEDED FOR  NAUSEA OR VOMITING 180 tablet 0   ONETOUCH DELICA LANCETS FINE MISC Check blood sugar once daily and as needed. 250.00 100 each 1   ONETOUCH ULTRA test strip USE TO CHECK BLOOD SUGAR 2  TIMES DAILY 200 strip 3   pantoprazole (PROTONIX) 40 MG tablet TAKE 1 TABLET BY MOUTH  TWICE DAILY ON AN EMPTY  STOMACH 180 tablet 3   rosuvastatin (CRESTOR) 5 MG tablet Take 1 tablet (5 mg total) by mouth daily. 90 tablet 3   sucralfate (CARAFATE) 1 g tablet Take 1 tablet (  1 g total) by mouth in the morning, at noon, and at bedtime. 270 tablet 3   TALTZ 80 MG/ML SOAJ Inject into the skin.     traZODone (DESYREL) 100 MG tablet TAKE 2 TABLETS BY MOUTH  EVERY NIGHT AT BEDTIME 180 tablet 3   triamterene-hydrochlorothiazide (MAXZIDE-25) 37.5-25 MG tablet TAKE 1 TABLET BY MOUTH ONCE DAILY 90 tablet 3   valACYclovir (VALTREX) 1000 MG tablet TAKE 2 TABLETS BY MOUTH  AT ONSET OF SYMPTOMS AS NEEDED FOR FEVER BLISTERS AND TAKE 2 TABLETS 12 HOURS LATER. 90 tablet 0   Continuous Blood Gluc Receiver (DEXCOM G6 RECEIVER) DEVI USE AS INSTRUCTED TO CHECK BLOOD SUGAR 1 each 0   fluconazole (DIFLUCAN) 150 MG tablet Take 1 tablet (150 mg total) by mouth once a week. (Patient not taking: Reported on 10/19/2022) 2 tablet 1   No facility-administered medications prior to visit.     Review of Systems:   Constitutional:   No  weight loss, night sweats,  Fevers, chills,  +fatigue, or  lassitude.  HEENT:   No headaches,  Difficulty swallowing,  Tooth/dental problems, or  Sore throat,                No sneezing, itching, ear ache, nasal congestion, post nasal drip,   CV:  No chest pain,  Orthopnea, PND, swelling in lower extremities, anasarca, dizziness, palpitations, syncope.   GI  No heartburn, indigestion, abdominal pain, nausea, vomiting, diarrhea, change in bowel habits, loss of appetite,  bloody stools.   Resp: No shortness of breath with exertion or at rest.  No excess mucus, no productive cough,  No non-productive cough,  No coughing up of blood.  No change in color of mucus.  No wheezing.  No chest wall deformity  Skin: no rash or lesions.  GU: no dysuria, change in color of urine, no urgency or frequency.  No flank pain, no hematuria   MS:  + joint pain    Physical Exam  BP 130/70 (BP Location: Left Arm, Cuff Size: Normal)   Pulse 95   Temp 98 F (36.7 C) (Temporal)   Ht _0  (1.626 m)   Wt 172 lb 9.6 oz (78.3 kg)   SpO2 96%   BMI 29.63 kg/m   GEN: A/Ox3; pleasant , NAD, well nourished    HEENT:  [place]/AT,  NOSE-clear, THROAT-clear, no lesions, no postnasal drip or exudate noted. Class 3 MP airway   NECK:  Supple w/ fair ROM; no JVD; normal carotid impulses w/o bruits; no thyromegaly or nodules palpated; no lymphadenopathy.    RESP  Clear  P & A; w/o, wheezes/ rales/ or rhonchi. no accessory muscle use, no dullness to percussion  CARD:  RRR, no m/r/g, no peripheral edema, pulses intact, no cyanosis or clubbing.  GI:   Soft & nt; nml bowel sounds; no organomegaly or masses detected.   Musco: Warm bil, no deformities or joint swelling noted.   Neuro: alert, no focal deficits noted.    Skin: Warm, no lesions or rashes    Lab Results:  CBC    BNP No results found for: "BNP"  ProBNP No results found for: "PROBNP"  Imaging:         No data to display          No results found for: "NITRICOXIDE"      Assessment & Plan:   Snoring Snoring, daytime sleepiness, restless sleep and daytime fatigue concerning for sleep apnea-set up for home sleep study   -  discussed how weight can impact sleep and risk for sleep disordered breathing - discussed options to assist with weight loss: combination of diet modification, cardiovascular and strength training exercises   - had an extensive discussion regarding the adverse health consequences  related to untreated sleep disordered breathing - specifically discussed the risks for hypertension, coronary artery disease, cardiac dysrhythmias, cerebrovascular disease, and diabetes - lifestyle modification discussed   - discussed how sleep disruption can increase risk of accidents, particularly when driving - safe driving practices were discussed   Plan  Patient Instructions  Set up for home sleep study  Work on healthy weight loss  Do not drive if sleepy  Healthy sleep regimen  Follow up in 6-8 weeks to discuss results and treatment plan.        Rexene Edison, NP 10/19/2022

## 2022-10-19 NOTE — Assessment & Plan Note (Signed)
Snoring, daytime sleepiness, restless sleep and daytime fatigue concerning for sleep apnea-set up for home sleep study   - discussed how weight can impact sleep and risk for sleep disordered breathing - discussed options to assist with weight loss: combination of diet modification, cardiovascular and strength training exercises   - had an extensive discussion regarding the adverse health consequences related to untreated sleep disordered breathing - specifically discussed the risks for hypertension, coronary artery disease, cardiac dysrhythmias, cerebrovascular disease, and diabetes - lifestyle modification discussed   - discussed how sleep disruption can increase risk of accidents, particularly when driving - safe driving practices were discussed   Plan  Patient Instructions  Set up for home sleep study  Work on healthy weight loss  Do not drive if sleepy  Healthy sleep regimen  Follow up in 6-8 weeks to discuss results and treatment plan.

## 2022-10-23 ENCOUNTER — Other Ambulatory Visit (HOSPITAL_COMMUNITY): Payer: Self-pay

## 2022-10-23 MED ORDER — OMNIPOD 5 DEXG7G6 PODS GEN 5 MISC: 1.0000 | 11 refills | Status: DC

## 2022-10-23 NOTE — Telephone Encounter (Signed)
So, they approved it if we call in 1 month supply at a time (15 pods)?

## 2022-10-23 NOTE — Telephone Encounter (Signed)
Patient Advocate Encounter  Prior Authorization for Omnipod 5 G6 Pod (Gen 5) has been approved.    PA# [id number] Key: [id number]  Effective dates: 10/19/2022 through 10/20/23

## 2022-10-23 NOTE — Telephone Encounter (Signed)
Rx sent for 30 day supply with 11 refills.

## 2022-10-25 ENCOUNTER — Other Ambulatory Visit: Payer: Self-pay | Admitting: Internal Medicine

## 2022-10-25 NOTE — Telephone Encounter (Signed)
Name of Medication: Hydrocodone Name of Pharmacy: [facility] or Written Date and Quantity: 08-30-22 #60 Last Office Visit and Type: 10-05-22 Next Office Visit and Type: 03-12-23 Last Controlled Substance Agreement Date: N/A Last UDS:N/A

## 2022-10-26 MED ORDER — HYDROCODONE-ACETAMINOPHEN 5-325 MG PO TABS: 1.0000 | ORAL_TABLET | ORAL | 0 refills | Status: DC | PRN

## 2022-10-27 ENCOUNTER — Encounter: Payer: Self-pay | Admitting: Internal Medicine

## 2022-11-07 ENCOUNTER — Other Ambulatory Visit: Payer: Self-pay | Admitting: Internal Medicine

## 2022-11-16 ENCOUNTER — Encounter: Payer: Self-pay | Admitting: Internal Medicine

## 2022-11-17 ENCOUNTER — Other Ambulatory Visit: Payer: Self-pay | Admitting: Internal Medicine

## 2022-11-23 ENCOUNTER — Encounter: Payer: Self-pay | Admitting: Internal Medicine

## 2022-11-23 ENCOUNTER — Ambulatory Visit (INDEPENDENT_AMBULATORY_CARE_PROVIDER_SITE_OTHER): Payer: 59 | Admitting: Internal Medicine

## 2022-11-23 VITALS — BP 112/68 | HR 100 | Ht 64.0 in | Wt 177.0 lb

## 2022-11-23 DIAGNOSIS — E1142 Type 2 diabetes mellitus with diabetic polyneuropathy: Secondary | ICD-10-CM | POA: Diagnosis not present

## 2022-11-23 DIAGNOSIS — E782 Mixed hyperlipidemia: Secondary | ICD-10-CM | POA: Diagnosis not present

## 2022-11-23 DIAGNOSIS — E1165 Type 2 diabetes mellitus with hyperglycemia: Secondary | ICD-10-CM

## 2022-11-23 LAB — POCT GLYCOSYLATED HEMOGLOBIN (HGB A1C): Hemoglobin A1C: 7.3 % — AB (ref 4.0–5.6)

## 2022-11-23 MED ORDER — EMPAGLIFLOZIN 10 MG PO TABS: 10.0000 mg | ORAL_TABLET | Freq: Every day | ORAL | 3 refills | Status: DC

## 2022-11-23 MED ORDER — RYBELSUS 3 MG PO TABS: 7.0000 mg | ORAL_TABLET | Freq: Every day | ORAL | 2 refills | Status: DC

## 2022-11-23 NOTE — Progress Notes (Signed)
Patient ID: Shannon [name], female   DOB: 12-14-1955, 67 y.o.   MRN: [id number]   HPI: Shannon [name] is a 67 y.o.-year-old female, initially referred by her PCP, Dr. Silvio Pate, returning for follow-up for DM2, dx in 2012, insulin-dependent since ~2019, uncontrolled, with complications (peripheral neuropathy). Last visit 4 months ago.    Interim history: She has occasional nausea but no increased urination, nausea, chest pain. She has blurry vision and increased thirst. Sugars have been higher in the last 2 weeks.  Reviewed history: Patient described that her sugars worsened after she had to stop Metformin due to GI symptoms and was changed to sulfonylurea. They increased even further after steroid injection for Achilles tendinitis.  Her sugars were initially in the 300-400 range, but then increased to 500-HI (>600) >> she presented to the emergency room (11/12/2020).  Afterwards, she started to work on her diet.  Insulin pump: -OmniPod Dash  Insulin: -She has allergy to Humalog/Lyumjev >> significant irritation and inflammation at the infusion site -Switched to NovoLog samples since NovoLog was not covered by her insurance despite 2 preauthorization requests - U500 now  CGM: -Dexcom G6  Supplies: -Mail-order pharmacy for Dexcom  Reviewed HbA1c levels: Lab Results  Component Value Date   HGBA1C 8.5 (A) 07/20/2022   HGBA1C 8.5 (A) 03/16/2022   HGBA1C 8.2 (A) 11/15/2021   HGBA1C 8.0 (A) 07/22/2021   HGBA1C 10.4 (A) 02/15/2021   HGBA1C 9.0 (A) 11/16/2020   HGBA1C 7.8 (A) 07/13/2020   HGBA1C 8.1 (H) 12/22/2019   HGBA1C 7.3 (A) 07/04/2019   HGBA1C 8.9 (H) 01/22/2019   HGBA1C 8.8 (H) 10/15/2018   HGBA1C 7.9 (H) 02/13/2018   HGBA1C 7.0 (H) 09/19/2017   HGBA1C 8.6 (H) 04/13/2017   HGBA1C 8.2 (H) 11/16/2016   HGBA1C 7.4 (H) 05/17/2016   HGBA1C 7.8 (H) 11/19/2015   HGBA1C 7.2 (H) 04/23/2015   HGBA1C 8.4 (H) 01/14/2015   HGBA1C 8.6 (H) 09/22/2014   HGBA1C 8.0 (H) 03/31/2014    HGBA1C 6.9 (H) 09/23/2013   HGBA1C 7.0 (H) 03/17/2013   HGBA1C 6.5 09/16/2012   HGBA1C 6.6 (H) 02/20/2012   HGBA1C 9.0 (H) 10/19/2011   Previously on: - Glimepiride 2 >> 4 mg before breakfast   - Onglyza 5 mg before breakfast - Lantus 30-35 units at bedtime She is intolerant to Metformin >> N/V/D >> had to stop and changed to Glimepiride.  Previously on: - Ozempic 0.5 mg weekly >> stopped 2/2 nausea - Lantus 26 >> 34 >> Toujeo 46 units at bedtime - Lyumjev 6-10 >> 12-18 >> 15 >> 20 units before each meal  Now on: - Farxiga 10 mg before breakfast- tried this in 03/2021 (not covered) >> restarted 07/2021 >> now Jardiance 10 mg daily - ran out - Pump settings with U500 - changes suggested at last OV are shown in bold- but she did not make any of them!!! - basal rates: 12 am: 1.0 >> 1.2 3 am: 0.9  6 am: 0.7  9 am: 0.8 >> 1.1 9 PM: 1.1 >> 1.2 - ICR:   12 AM: 1: 17 >> 1:14 8 PM: 1: 9 >> 1:8 - target: 120-120  - ISF: 1:60 - Active insulin time: 6 hours TDD from basal insulin: 70.9 units (57%) >> 64% >> 63% >> 61% >> 67-78 >> 66% TDD from bolus insulin: 54.1 units (43%) >> 36% >> 37% >> 39% >> 22-33% >> 33% Total daily dose: 150-170 units/day - extended bolusing: not using - changes infusion site: q1-2  days  She checks her sugars more than 4 times a day with her  Dexcom CGM:   Previously:   Previously:   Lowest sugar was LO >> 50s >> 55 >> 47; she has hypoglycemia awareness at 110.  Highest sugar was HI ... >> 400 >> 300s >> 400s  Glucometer: One Touch Verio  Pt's meals are: - Breakfast: usually skips, when eats b'fast: sausage bisquit - Lunch: salad or sandwich - Dinner: meat and 2 veggies - Snacks: 1-2: apples, oranges, occasionally chips  -+ CKD, last BUN/creatinine:  Lab Results  Component Value Date   BUN 22 10/05/2022   BUN 14 09/15/2021   CREATININE 0.85 10/05/2022   CREATININE 0.93 09/15/2021  She is not on ACE inhibitor/ARB. She had cough with  lisinopril.  -+ HL; last set of lipids: Lab Results  Component Value Date   CHOL 165 06/27/2022   HDL 51 06/27/2022   LDLCALC 79 06/27/2022   LDLDIRECT 105.0 12/22/2019   TRIG 176 (H) 06/27/2022   CHOLHDL 3.2 06/27/2022  On Crestor 5 mg  daily.  - last eye exam was in 2023: No DR reportedly. She has glaucoma - exams q6 mo. Her blurry vision improved (had laser surgery for glaucoma 05/2021).   - She has numbness and tingling in her toes. She also has muscle cramps in her feet.  Last foot exam 03/07/2022.  Pt has FH of DM in mother, son, daughter, sister, MGM, MGF, M aunt and uncles.  She also has a history of HTN, fatty liver, GERD. She has psoriatic arthritis and ankylosing spondylitis. She has significant joint pain - hands, back, hips.  She is on a biologic infusion (Cosentyx). She presented to the ED for nausea on 12/24/2019.  Per review of the labs, she appeared dehydrated, but no distinct pathology was found.  Of note, lipase was normal. She started Lopressor for HTN - but had dizziness >> stopped.  TSH was normal: Lab Results  Component Value Date   TSH 0.57 10/05/2022   ROS: + see HPI  I reviewed pt's medications, allergies, PMH, social hx, family hx, and changes were documented in the history of present illness. Otherwise, unchanged from my initial visit note.  Past Medical History:  Diagnosis Date   Actinic keratosis    Allergy    Arthritis    hip, back, shoulders, joints   Asthma    Complication of anesthesia    problems with block during breast augmentation and epidural during childbirth   Depression    Diabetes mellitus 09/2011   type 2   Family history of adverse reaction to anesthesia    PROBLEMS WITH BLOCKS   Fatty liver    GERD (gastroesophageal reflux disease)    Glaucoma suspect    Headache    migraines-4x a week headache-1x week migraine   History of hiatal hernia    Hyperlipidemia    Hypertension    Controlled on meds   Mitral valve prolapse     Nose colonized with MRSA 05/15/2018   Osteopenia    Pneumonia    PONV (postoperative nausea and vomiting)    Psoriasis    Shingles 11/2006   Sleep disturbance    Tachycardia    Vertigo    Hx of   Past Surgical History:  Procedure Laterality Date   ABDOMINAL HYSTERECTOMY  2000   /BSO - abn. Paps   APPENDECTOMY  1978   AUGMENTATION MAMMAPLASTY Bilateral 1981   BLADDER REPAIR     x2  BREAST ENHANCEMENT SURGERY  1987   CARDIAC CATHETERIZATION     [facility]   CARDIAC CATHETERIZATION Left 12/18/2016   Procedure: Left Heart Cath and Coronary Angiography;  Surgeon: Wellington Hampshire, MD;  Location: [facility];  Service: Cardiovascular;  Laterality: Left;   CARPAL TUNNEL RELEASE Bilateral    CERVICAL DISCECTOMY  11/2009   with synthetic discs inserted--  Dr Arnoldo Morale   COLONOSCOPY     JOINT REPLACEMENT     NM MYOVIEW LTD  03/2005   EF 56%, no ischemia   RECTAL PROLAPSE REPAIR     x1   SHOULDER ARTHROSCOPY WITH OPEN ROTATOR CUFF REPAIR Left 12/29/2019   Procedure: LEFT SHOULDER ARTHROSCOPIC DISTAL CLAVICLE EXCISISON, SUBACROMIAL DECOMPRESSION,BICEPS [place] ;  Surgeon: Lovell Sheehan, MD;  Location: [facility];  Service: Orthopedics;  Laterality: Left;   SHOULDER CLOSED REDUCTION Left 05/03/2020   Procedure: Left shoulder arthroscopic lysis of adhesions and manipulation under anesthesia;  Surgeon: Lovell Sheehan, MD;  Location: [facility];  Service: Orthopedics;  Laterality: Left;   SHOULDER SURGERY  09/2008, 01/2009   Right shoulder surgery, then redone with lysis of adhesions   TOTAL HIP ARTHROPLASTY Right 05/29/2018   Procedure: TOTAL HIP ARTHROPLASTY ANTERIOR APPROACH;  Surgeon: Lovell Sheehan, MD;  Location: [facility];  Service: Orthopedics;  Laterality: Right;   UPPER GI ENDOSCOPY     Social History   Socioeconomic History   Marital status: Married    Spouse name: Not on file   Number of children: 5   Years of education: Not on file   Highest  education level: Not on file  Occupational History   Occupation: homemaker  Tobacco Use   Smoking status: Never    Passive exposure: Past   Smokeless tobacco: Never  Vaping Use   Vaping Use: Never used  Substance and Sexual Activity   Alcohol use: No   Drug use: No   Sexual activity: Not on file  Other Topics Concern   Not on file  Social History Narrative   Home maker   Social Determinants of Health   Financial Resource Strain: Not on file  Food Insecurity: Not on file  Transportation Needs: Not on file  Physical Activity: Not on file  Stress: Not on file  Social Connections: Not on file  Intimate Partner Violence: Not on file   Current Outpatient Medications on File Prior to Visit  Medication Sig Dispense Refill   aspirin-acetaminophen-caffeine (EXCEDRIN MIGRAINE) 250-250-65 MG tablet Take 1 tablet by mouth every 6 (six) hours as needed for headache.     Blood Glucose Monitoring Suppl (FIFTY50 GLUCOSE METER 2.0) w/Device KIT OneTouch Ultra2 Meter kit     cetirizine (ZYRTEC) 10 MG tablet Take 10 mg by mouth daily.      clobetasol (TEMOVATE) 0.05 % external solution Patient to mix in jar of CeraVe Cream and apply to back twice daily until itch improved. Avoid face, groin, axilla. 50 mL 1   Continuous Blood Gluc Sensor (DEXCOM G6 SENSOR) MISC CHANGE 1 SENSOR EVERY 10  DAYS AS DIRECTED 9 each 3   Continuous Blood Gluc Transmit (DEXCOM G6 TRANSMITTER) MISC 1 Device by Does not apply route every 3 (three) months. 1 each 1   docusate sodium (COLACE) 100 MG capsule Take 300 mg by mouth at bedtime.      DULoxetine (CYMBALTA) 30 MG capsule TAKE 1 CAPSULE BY MOUTH DAILY 90 capsule 1   Fluocinolone Acetonide 0.01 % OIL Apply 1-2 gtts to  ears once to twice daily as needed until improved. 20 mL 2   fluticasone (FLONASE) 50 MCG/ACT nasal spray Place 1 spray into both nostrils daily.     furosemide (LASIX) 20 MG tablet Take 1 tablet by mouth once daily 30 tablet 5   Glucagon 3 MG/DOSE POWD  Place 3 mg into the nose once as needed for up to 1 dose. 1 each 11   HYDROcodone-acetaminophen (NORCO/VICODIN) 5-325 MG tablet Take 1 tablet by mouth every 4 (four) hours as needed for moderate pain. 60 tablet 0   Insulin Disposable Pump (OMNIPOD 5 G6 POD, GEN 5,) MISC 1 each by Does not apply route every other day. 15 each 11   insulin regular human CONCENTRATED (HUMULIN R) 500 UNIT/ML injection USE UP TO 200 UNITS OF CONCENTRATED INSULIN IN THE INSULIN PUMP DAILY 20 mL 1   JARDIANCE 10 MG TABS tablet TAKE 1 TABLET BY MOUTH DAILY  BEFORE BREAKFAST 90 tablet 3   LUMIGAN 0.01 % SOLN Place 1 drop into both eyes at bedtime.      metoprolol succinate (TOPROL-XL) 25 MG 24 hr tablet TAKE 1 AND 1/2 TABLETS BY  MOUTH DAILY 135 tablet 3   ondansetron (ZOFRAN-ODT) 4 MG disintegrating tablet DISSOLVE 1 TABLET ON THE TONGUE  EVERY 6 HOURS AS NEEDED FOR  NAUSEA OR VOMITING 180 tablet 0   ONETOUCH DELICA LANCETS FINE MISC Check blood sugar once daily and as needed. 250.00 100 each 1   ONETOUCH ULTRA test strip USE TO CHECK BLOOD SUGAR 2  TIMES DAILY 200 strip 3   pantoprazole (PROTONIX) 40 MG tablet TAKE 1 TABLET BY MOUTH  TWICE DAILY ON AN EMPTY  STOMACH 180 tablet 3   rosuvastatin (CRESTOR) 5 MG tablet Take 1 tablet (5 mg total) by mouth daily. 90 tablet 3   sucralfate (CARAFATE) 1 g tablet Take 1 tablet (1 g total) by mouth in the morning, at noon, and at bedtime. 270 tablet 3   TALTZ 80 MG/ML SOAJ Inject into the skin.     traZODone (DESYREL) 100 MG tablet TAKE 2 TABLETS BY MOUTH  EVERY NIGHT AT BEDTIME 180 tablet 3   triamterene-hydrochlorothiazide (MAXZIDE-25) 37.5-25 MG tablet TAKE 1 TABLET BY MOUTH ONCE  DAILY 90 tablet 3   valACYclovir (VALTREX) 1000 MG tablet TAKE 2 TABLETS BY MOUTH  AT ONSET OF SYMPTOMS AS NEEDED FOR FEVER BLISTERS AND TAKE 2 TABLETS 12 HOURS LATER. 90 tablet 0   No current facility-administered medications on file prior to visit.   Allergies  Allergen Reactions   Humalog  [Insulin Lispro] Swelling   Lisinopril Cough   Nitrofurantoin Other (See Comments)    Pt unsure   Oxycodone-Acetaminophen Itching   Percocet [Oxycodone-Acetaminophen]    Atorvastatin Diarrhea    May have caused colitis--but not clear cut   Clarithromycin Palpitations   Glipizide Other (See Comments)    Sugars too variable   Invokana [Canagliflozin]     Nausea, constipation   Family History  Problem Relation Age of Onset   Diabetes Mother    Heart disease Mother    Hypertension Mother    Ovarian cancer Mother    Melanoma Mother    Cancer Father    Coronary artery disease Father    Heart disease Father    Diabetes Sister    Depression Sister    Coronary artery disease Sister    Heart disease Sister    Pulmonary Hypertension Sister    Rheum arthritis Maternal Aunt  Heart attack Paternal Uncle    Heart attack Paternal Uncle    Breast cancer Maternal Grandmother    Diabetes Maternal Grandmother    Diabetes Maternal Grandfather    Heart attack Cousin    Colon cancer Neg Hx    PE: BP 112/68 (BP Location: Right Arm, Patient Position: Sitting, Cuff Size: Normal)   Pulse 100   Ht 5' 4" (1.626 m)   Wt 177 lb (80.3 kg)   SpO2 94%   BMI 30.38 kg/m  Wt Readings from Last 3 Encounters:  11/23/22 177 lb (80.3 kg)  10/19/22 172 lb 9.6 oz (78.3 kg)  10/05/22 173 lb (78.5 kg)   Constitutional: normal weight, in NAD Eyes: EOMI, no exophthalmos ENT: no thyromegaly, no cervical lymphadenopathy Cardiovascular: tachycardia, RR, No MRG Respiratory: CTA B Musculoskeletal: no deformities Skin: no rashes Neurological: no tremor with outstretched hands  ASSESSMENT: 1. DM2, insulin-dependent, uncontrolled, without long-term complications, but with significant hyperglycemia  2. HL  PLAN:  1. Patient with poorly controlled type 2 diabetes, previously on the basal/bolus insulin regimen, with very high blood sugars, improved on the U-500 insulin regimen.  We tried to add Ozempic but  she had to stop due to constant nausea.  We were able to add [place] afterwards, which she tolerates well.  After last visit, she switched from OmniPod DASH to OmniPod 5 insulin pump, integrated with the Dexcom CGM. -At last visit, reviewing the CGM trends, sugars are fluctuating around the upper limit of the target range, with blood sugars in the 200s and even higher.  They were highest after the first and last meal of the day.  She was not bolusing consistently before meals and we discussed about how to do so correctly.  I also recommend an adjustment and in her pump settings, strengthening her insulin to carb ratios, which is important on the OmniPod 5 pump. -Latest HbA1c was 8.5%, stable, at last visit. CGM interpretation: -At today's visit, we reviewed her CGM downloads: It appears that 40% of values are in target range (goal >70%), while 50% are higher than 180 (goal <25%), and 0% are lower than 70 (goal <4%).  The calculated average blood sugar is 206.  The projected HbA1c for the next 3 months (GMI) is 8.2%. -Reviewing the CGM trends, sugars are quite fluctuating, with still significant increase in blood sugars after breakfast and persistent high blood sugars after the rest of her meals.  Sugars are occasionally dropping during the night.  Upon questioning in reviewing her pump downloads, she did not make the recommended changes at last visit.  At today's visit, I again advised her to strengthen insulin to carb ratios, but I did not suggest any increase in basal rates during the night due to her history of lows at night.  Also, upon questioning, her sugars may be higher in the last 2 weeks as she ran out of Jardiance.  I refilled this for her. -She is worried about her weight gain and we discussed about possible options.  She had nausea with Ozempic which she would agree to try Rybelsus at a low dose and increase as tolerated.  Another option would be a low-dose Trulicity, if covered by her insurance.   We discussed about benefits and possible side effects. -I suggested: Patient Instructions  Please use the following pump settings: - basal rates: 12 am: 1.0  3 am: 0.9  6 am: 0.7  9 am: 0.8 >> 1 9 PM: 1.1  - ICR:  12 AM: 1: 17 >> 1:14 8 PM: 1: 9 (try to start this bolus 45-60 min before the meal) - target: 120-120  - ISF: 1:60 - Active insulin time: 6 hours  Please restart: - Jardiance 10 mg before b'fast.  Start: - Rybelsus 3.5 mg daily before b'fast - increase to 7 mg in 1-2 weeks if no side effects  Please return in 3-4 months.  - we checked her HbA1c: 7.3% (lower) - advised to check sugars at different times of the day - 4x a day, rotating check times - advised for yearly eye exams >> she is UTD - return to clinic in 3-4 months  2. HL -Reviewed latest lipid panel from 06/2022: LDL above target, triglycerides slightly high: Lab Results  Component Value Date   CHOL 165 06/27/2022   HDL 51 06/27/2022   LDLCALC 79 06/27/2022   LDLDIRECT 105.0 12/22/2019   TRIG 176 (H) 06/27/2022   CHOLHDL 3.2 06/27/2022  -She continues on Crestor 5 mg daily without side effects  Philemon Kingdom, MD PhD [facility] Endocrinology

## 2022-11-23 NOTE — Patient Instructions (Addendum)
Please use the following pump settings: - basal rates: 12 am: 1.0  3 am: 0.9  6 am: 0.7  9 am: 0.8 >> 1 9 PM: 1.1  - ICR:   12 AM: 1: 17 >> 1:14 8 PM: 1: 9 (try to start this bolus 45-60 min before the meal) - target: 120-120  - ISF: 1:60 - Active insulin time: 6 hours  Please restart: - Jardiance 10 mg before b'fast.  Start: - Rybelsus 3.5 mg daily before b'fast - increase to 7 mg in 1-2 weeks if no side effects  Please return in 3-4 months.

## 2022-12-04 ENCOUNTER — Other Ambulatory Visit: Payer: Self-pay | Admitting: Internal Medicine

## 2022-12-12 ENCOUNTER — Encounter: Payer: Self-pay | Admitting: Internal Medicine

## 2022-12-12 DIAGNOSIS — E1142 Type 2 diabetes mellitus with diabetic polyneuropathy: Secondary | ICD-10-CM

## 2022-12-12 MED ORDER — RYBELSUS 7 MG PO TABS: 7.0000 mg | ORAL_TABLET | Freq: Every day | ORAL | 0 refills | Status: DC

## 2022-12-13 MED ORDER — HYDROCODONE-ACETAMINOPHEN 5-325 MG PO TABS: 1.0000 | ORAL_TABLET | ORAL | 0 refills | Status: DC | PRN

## 2022-12-13 NOTE — Telephone Encounter (Signed)
[facility]

## 2022-12-25 ENCOUNTER — Other Ambulatory Visit: Payer: Self-pay | Admitting: Internal Medicine

## 2022-12-25 DIAGNOSIS — E1142 Type 2 diabetes mellitus with diabetic polyneuropathy: Secondary | ICD-10-CM

## 2023-01-01 ENCOUNTER — Other Ambulatory Visit: Payer: Self-pay

## 2023-01-01 ENCOUNTER — Other Ambulatory Visit: Payer: Self-pay | Admitting: Internal Medicine

## 2023-01-01 DIAGNOSIS — E1142 Type 2 diabetes mellitus with diabetic polyneuropathy: Secondary | ICD-10-CM

## 2023-01-01 MED ORDER — HUMULIN R U-500 (CONCENTRATED) 500 UNIT/ML ~~LOC~~ SOLN: SUBCUTANEOUS | 3 refills | Status: DC

## 2023-01-19 HISTORY — PX: TOTAL HIP ARTHROPLASTY: SHX124

## 2023-01-30 ENCOUNTER — Telehealth (INDEPENDENT_AMBULATORY_CARE_PROVIDER_SITE_OTHER): Payer: 59 | Admitting: Pulmonary Disease

## 2023-01-30 DIAGNOSIS — G4733 Obstructive sleep apnea (adult) (pediatric): Secondary | ICD-10-CM

## 2023-01-30 NOTE — Telephone Encounter (Signed)
HST showed severe OSA with AHI 56/ hr , low sat 77% Suggest autoCPAP  5-15 cm, mask of choice vs formal titration

## 2023-01-31 NOTE — Telephone Encounter (Signed)
Patient is aware of below message/recommendations and voiced her understanding.  She will inform surgeon on this tomorrow.  I have forwarded recommendations to surgeon via epic. Patient will make provider aware of these as well.  Nothing further needed.

## 2023-01-31 NOTE — Telephone Encounter (Signed)
Patient in office. She is scheduled for hip surgery tomorrow at 5:45a. She is questioning if it is okay to have surgery with new dx of severe OSA.  Sarah, please advise. Tammy is unavailable. Thanks

## 2023-01-31 NOTE — Telephone Encounter (Signed)
Sleep study shows severe sleep apnea please set up office visit to discuss sleep study results and treatment plan

## 2023-01-31 NOTE — Telephone Encounter (Signed)
Called and spoke with patient, advised of results/recommendations per Rexene Edison NP.  She verbalized understanding.  She stated that she was having hip surgery tomorrow and would be at least staying overnight.  She said she was hoping to have the results before her surgery.  I advised her that we just received her results today.  I let her know that I would make Tammy aware to see what needed to be done post op.  I also let her know that we could do a virtual visit once she was settled at home so we can discuss her sleep study results and recommendations.  She verbalized understanding.  Tammy, Please advise on what patient will need during her hospital stay?

## 2023-02-01 HISTORY — PX: OTHER SURGICAL HISTORY: SHX169

## 2023-02-01 NOTE — Telephone Encounter (Signed)
She was seen for sleep consult in Nov . Found to have severe OSA , waiting on office visit to begin CPAP .  Please set up OV .  Also from preop clearance, surgeon/Anesthesia needs to be aware of OSA . May need CPAP in post op setting if indicated,

## 2023-02-01 NOTE — Telephone Encounter (Signed)
Called patient but she did not answer. Left message for her to call back.  

## 2023-02-02 NOTE — Telephone Encounter (Signed)
Pt. Calling back he's is still in [place]. From hip surgery and I mad  amy chart visit with tammy next week please call pt. Back to advise if this was all they needs to discuss

## 2023-02-05 ENCOUNTER — Telehealth: Payer: Self-pay | Admitting: Adult Health

## 2023-02-05 ENCOUNTER — Telehealth: Payer: Self-pay | Admitting: *Deleted

## 2023-02-05 NOTE — Telephone Encounter (Signed)
Seems like encounter was open in error so closing encounter.  

## 2023-02-05 NOTE — Telephone Encounter (Signed)
Called and spoke with patient, advised that I put her back on the schedule for tomorrow as I was under the impression when she was initially on the schedule that she was still hospitalized.  I advised that she will be sent a link and she just clicks on the link and it will take her to the visit.  I let her know that she will need to log in at 9:15 am.  She verbalized understanding.

## 2023-02-05 NOTE — Telephone Encounter (Signed)
Please see telephone note from 3/13.  Discussed need for f/u after d/c from hospital and settled at home.

## 2023-02-06 ENCOUNTER — Telehealth: Payer: Self-pay | Admitting: *Deleted

## 2023-02-06 ENCOUNTER — Telehealth: Payer: 59 | Admitting: Adult Health

## 2023-02-06 ENCOUNTER — Telehealth (INDEPENDENT_AMBULATORY_CARE_PROVIDER_SITE_OTHER): Payer: 59 | Admitting: Adult Health

## 2023-02-06 DIAGNOSIS — G4733 Obstructive sleep apnea (adult) (pediatric): Secondary | ICD-10-CM | POA: Diagnosis not present

## 2023-02-06 NOTE — Progress Notes (Signed)
Reviewed and agree with assessment/plan.   Chesley Mires, MD [facility] Pulmonary/Critical Care 02/06/2023, 10:16 AM Pager:  [phone]

## 2023-02-06 NOTE — Patient Instructions (Addendum)
Begin CPAP At bedtime, wear all night long for at least 6hr or more.  Work on healthy weight loss  Do not drive if sleepy  Healthy sleep regimen  Follow up in 3 months in [place] office and As needed

## 2023-02-06 NOTE — Telephone Encounter (Signed)
Holly, Can you get [place] a copy of this patient's sleep study?  I need to put in an order for a CPAP machine.  Thank you.

## 2023-02-06 NOTE — Progress Notes (Signed)
Virtual Visit via Video Note  I connected with Shannon [name] on 02/06/23 at  9:30 AM EDT by a video enabled telemedicine application and verified that I am speaking with the correct person using two identifiers.  Location: Patient: Home  Provider: Office    I discussed the limitations of evaluation and management by telemedicine and the availability of in person appointments. The patient expressed understanding and agreed to proceed.  History of Present Illness: 67 year old female seen for sleep consult November 2023 for snoring and daytime sleepiness found to have severe obstructive sleep apnea Medical history significant for type 2 diabetes on insulin pump and psoriatic arthritis  Today's video visit is to discuss sleep study results.  Patient was seen in November for sleep consult.  Patient complained for snoring and daytime sleepiness.  She was set up for home sleep study that showed very severe sleep apnea with AHI 56/hour and SpO2 low at 77%.. WE discussed her test results . Went over treatment options with weight loss and CPAP therapy . Patient agrees to begin CPAP . Patient education given.   Past Medical History:  Diagnosis Date   Actinic keratosis    Allergy    Arthritis    hip, back, shoulders, joints   Asthma    Complication of anesthesia    problems with block during breast augmentation and epidural during childbirth   Depression    Diabetes mellitus 09/2011   type 2   Family history of adverse reaction to anesthesia    PROBLEMS WITH BLOCKS   Fatty liver    GERD (gastroesophageal reflux disease)    Glaucoma suspect    Headache    migraines-4x a week headache-1x week migraine   History of hiatal hernia    Hyperlipidemia    Hypertension    Controlled on meds   Mitral valve prolapse    Nose colonized with MRSA 05/15/2018   Osteopenia    Pneumonia    PONV (postoperative nausea and vomiting)    Psoriasis    Shingles 11/2006   Sleep disturbance    Tachycardia     Vertigo    Hx of    Current Outpatient Medications on File Prior to Visit  Medication Sig Dispense Refill   aspirin EC 81 MG tablet Take 81 mg by mouth 2 (two) times daily.     Blood Glucose Monitoring Suppl (FIFTY50 GLUCOSE METER 2.0) w/Device KIT OneTouch Ultra2 Meter kit     cetirizine (ZYRTEC) 10 MG tablet Take 10 mg by mouth daily.      clobetasol (TEMOVATE) 0.05 % external solution Patient to mix in jar of CeraVe Cream and apply to back twice daily until itch improved. Avoid face, groin, axilla. 50 mL 1   Continuous Blood Gluc Sensor (DEXCOM G6 SENSOR) MISC CHANGE 1 SENSOR EVERY 10  DAYS AS DIRECTED 9 each 3   Continuous Blood Gluc Transmit (DEXCOM G6 TRANSMITTER) MISC 1 Device by Does not apply route every 3 (three) months. 1 each 1   docusate sodium (COLACE) 100 MG capsule Take 300 mg by mouth at bedtime.      DULoxetine (CYMBALTA) 30 MG capsule TAKE 1 CAPSULE BY MOUTH DAILY 90 capsule 1   empagliflozin (JARDIANCE) 10 MG TABS tablet Take 1 tablet (10 mg total) by mouth daily before breakfast. 90 tablet 3   Fluocinolone Acetonide 0.01 % OIL Apply 1-2 gtts to ears once to twice daily as needed until improved. 20 mL 2   fluticasone (FLONASE) 50 MCG/ACT nasal  spray Place 1 spray into both nostrils daily.     furosemide (LASIX) 20 MG tablet Take 1 tablet by mouth once daily 30 tablet 5   HYDROcodone-acetaminophen (NORCO/VICODIN) 5-325 MG tablet Take 1 tablet by mouth every 4 (four) hours as needed for moderate pain. 60 tablet 0   Insulin Disposable Pump (OMNIPOD 5 G6 POD, GEN 5,) MISC 1 each by Does not apply route every other day. 15 each 11   insulin regular human CONCENTRATED (HUMULIN R) 500 UNIT/ML injection USE UP TO 200 UNITS OF CONCENTRATED INSULIN IN THE INSULIN PUMP DAILY 20 mL 3   LUMIGAN 0.01 % SOLN Place 1 drop into both eyes at bedtime.      methocarbamol (ROBAXIN) 500 MG tablet Take 500 mg by mouth every 8 (eight) hours as needed.     metoprolol succinate (TOPROL-XL) 25 MG  24 hr tablet TAKE 1 AND 1/2 TABLETS BY  MOUTH DAILY 135 tablet 3   ondansetron (ZOFRAN-ODT) 4 MG disintegrating tablet DISSOLVE 1 TABLET ON THE TONGUE  EVERY 6 HOURS AS NEEDED FOR  NAUSEA AND VOMITING 180 tablet 0   ONETOUCH DELICA LANCETS FINE MISC Check blood sugar once daily and as needed. 250.00 100 each 1   ONETOUCH ULTRA test strip USE TO CHECK BLOOD SUGAR 2  TIMES DAILY 200 strip 3   pantoprazole (PROTONIX) 40 MG tablet TAKE 1 TABLET BY MOUTH  TWICE DAILY ON AN EMPTY  STOMACH 180 tablet 3   rosuvastatin (CRESTOR) 5 MG tablet Take 1 tablet (5 mg total) by mouth daily. 90 tablet 3   sucralfate (CARAFATE) 1 g tablet Take 1 tablet (1 g total) by mouth in the morning, at noon, and at bedtime. 270 tablet 3   TALTZ 80 MG/ML SOAJ Inject into the skin.     traZODone (DESYREL) 100 MG tablet TAKE 2 TABLETS BY MOUTH  EVERY NIGHT AT BEDTIME 180 tablet 3   triamterene-hydrochlorothiazide (MAXZIDE-25) 37.5-25 MG tablet TAKE 1 TABLET BY MOUTH ONCE  DAILY 90 tablet 3   valACYclovir (VALTREX) 1000 MG tablet TAKE 2 TABLETS BY MOUTH  AT ONSET OF SYMPTOMS AS NEEDED FOR FEVER BLISTERS AND TAKE 2 TABLETS 12 HOURS LATER. 90 tablet 0   aspirin-acetaminophen-caffeine (EXCEDRIN MIGRAINE) 250-250-65 MG tablet Take 1 tablet by mouth every 6 (six) hours as needed for headache. (Patient not taking: Reported on 02/06/2023)     gabapentin (NEURONTIN) 100 MG capsule Take 100 mg by mouth 2 (two) times daily.     Glucagon 3 MG/DOSE POWD Place 3 mg into the nose once as needed for up to 1 dose. (Patient not taking: Reported on 02/06/2023) 1 each 11   No current facility-administered medications on file prior to visit.      Observations/Objective: HST showed severe OSA with AHI 56/ hr , low sat 77%   Assessment and Plan: Severe OSA - reviewed sleep study results with patient education on OSA and treatment options .  Begin Auto CPAP 5-58mH2O.   Plan  Patient Instructions  Begin CPAP At bedtime, wear all night long for at  least 6hr or more.  Work on healthy weight loss  Do not drive if sleepy  Healthy sleep regimen  Follow up in 3 months in [place] office and As needed       Follow Up Instructions:    I discussed the assessment and treatment plan with the patient. The patient was provided an opportunity to ask questions and all were answered. The patient agreed with the plan  and demonstrated an understanding of the instructions.   The patient was advised to call back or seek an in-person evaluation if the symptoms worsen or if the condition fails to improve as anticipated.  I provided 22 minutes of non-face-to-face time during this encounter.   Rexene Edison, NP

## 2023-02-06 NOTE — Addendum Note (Signed)
Addended by: Vanessa Barbara on: 02/06/2023 10:19 AM   Modules accepted: Orders

## 2023-02-07 NOTE — Telephone Encounter (Signed)
I see there is a home study under media tab scanned into Epic on 01/12/23

## 2023-02-09 ENCOUNTER — Other Ambulatory Visit: Payer: Self-pay | Admitting: Internal Medicine

## 2023-02-09 DIAGNOSIS — R000 Tachycardia, unspecified: Secondary | ICD-10-CM

## 2023-02-13 ENCOUNTER — Ambulatory Visit: Payer: 59 | Admitting: Adult Health

## 2023-02-13 NOTE — Telephone Encounter (Signed)
Patient checking on order for CPAP machine. Patient phone number is [phone].

## 2023-02-14 NOTE — Telephone Encounter (Signed)
Called and spoke with patient. I advised her that we were able to find the sleep study in her chart and I will place the order today. She verbalized understanding.   Dr. Lamonte Sakai, would you will be willing to sign for the order since both Tammy and Dr. Elsworth Soho are out of office for the rest of the week?

## 2023-02-14 NOTE — Telephone Encounter (Signed)
Yes will do .

## 2023-02-14 NOTE — Telephone Encounter (Signed)
Order has been placed.

## 2023-03-06 ENCOUNTER — Ambulatory Visit: Payer: 59 | Admitting: Internal Medicine

## 2023-03-06 ENCOUNTER — Encounter: Payer: Self-pay | Admitting: Internal Medicine

## 2023-03-06 VITALS — BP 124/72 | HR 72 | Ht 64.0 in | Wt 173.4 lb

## 2023-03-06 DIAGNOSIS — E1142 Type 2 diabetes mellitus with diabetic polyneuropathy: Secondary | ICD-10-CM | POA: Diagnosis not present

## 2023-03-06 DIAGNOSIS — E1165 Type 2 diabetes mellitus with hyperglycemia: Secondary | ICD-10-CM | POA: Diagnosis not present

## 2023-03-06 DIAGNOSIS — E782 Mixed hyperlipidemia: Secondary | ICD-10-CM

## 2023-03-06 LAB — POCT GLYCOSYLATED HEMOGLOBIN (HGB A1C): Hemoglobin A1C: 6.9 % — AB (ref 4.0–5.6)

## 2023-03-06 NOTE — Patient Instructions (Addendum)
Please use the following pump settings: - basal rates: 12 am: 1.0  3 am: 0.9  6 am: 0.7  9 am: 1 9 PM: 1.1  - ICR:  (try to start the bolus 45-60 min before the meal)  12 AM: 1: 14 >> 1:11  4 PM: 1:14 8 PM: 1: 9 >> 1:8 - target: 120-120  - ISF: 1:60 - Active insulin time: 6 hours  Also, continue: - Jardiance 10 mg before b'fast.  Please return in 3-4 months.

## 2023-03-06 NOTE — Progress Notes (Signed)
Patient ID: Shannon [name], female   DOB: 07-17-1956, 67 y.o.   MRN: [id number]   HPI: Shannon [name] is a 67 y.o.-year-old female, initially referred by her PCP, Dr. Alphonsus Sias, returning for follow-up for DM2, dx in 2012, insulin-dependent since ~2019, uncontrolled, with complications (peripheral neuropathy). Last visit 3.5 months ago.    Interim history: She has occasional nausea, no increased urination, nausea, chest pain. Since last visit, she was diagnosed with severe OSA - now has a CPAP. She had hip replacement surgery - she is very tender. She and her daughter are cooking better foods -sugars are better.  Reviewed history: Patient described that her sugars worsened after she had to stop Metformin due to GI symptoms and was changed to sulfonylurea. They increased even further after steroid injection for Achilles tendinitis.  Her sugars were initially in the 300-400 range, but then increased to 500-HI (>600) >> she presented to the emergency room (11/12/2020).  Afterwards, she started to work on her diet.  Insulin pump: -OmniPod Dash  Insulin: -She has allergy to Humalog/Lyumjev >> significant irritation and inflammation at the infusion site -Switched to NovoLog samples since NovoLog was not covered by her insurance despite 2 preauthorization requests - U500 now  CGM: -Dexcom G6  Supplies: -Mail-order pharmacy for Dexcom  Reviewed HbA1c levels: Lab Results  Component Value Date   HGBA1C 7.3 (A) 11/23/2022   HGBA1C 8.5 (A) 07/20/2022   HGBA1C 8.5 (A) 03/16/2022   HGBA1C 8.2 (A) 11/15/2021   HGBA1C 8.0 (A) 07/22/2021   HGBA1C 10.4 (A) 02/15/2021   HGBA1C 9.0 (A) 11/16/2020   HGBA1C 7.8 (A) 07/13/2020   HGBA1C 8.1 (H) 12/22/2019   HGBA1C 7.3 (A) 07/04/2019   HGBA1C 8.9 (H) 01/22/2019   HGBA1C 8.8 (H) 10/15/2018   HGBA1C 7.9 (H) 02/13/2018   HGBA1C 7.0 (H) 09/19/2017   HGBA1C 8.6 (H) 04/13/2017   HGBA1C 8.2 (H) 11/16/2016   HGBA1C 7.4 (H) 05/17/2016   HGBA1C 7.8 (H)  11/19/2015   HGBA1C 7.2 (H) 04/23/2015   HGBA1C 8.4 (H) 01/14/2015   HGBA1C 8.6 (H) 09/22/2014   HGBA1C 8.0 (H) 03/31/2014   HGBA1C 6.9 (H) 09/23/2013   HGBA1C 7.0 (H) 03/17/2013   HGBA1C 6.5 09/16/2012   HGBA1C 6.6 (H) 02/20/2012   HGBA1C 9.0 (H) 10/19/2011   Previously on: - Glimepiride 2 >> 4 mg before breakfast   - Onglyza 5 mg before breakfast - Lantus 30-35 units at bedtime She is intolerant to Metformin >> N/V/D >> had to stop and changed to Glimepiride.  Previously on: - Ozempic 0.5 mg weekly >> stopped 2/2 nausea - Lantus 26 >> 34 >> Toujeo 46 units at bedtime - Lyumjev 6-10 >> 12-18 >> 15 >> 20 units before each meal  Now on: - Pump settings with U500 - changes suggested at last OV are shown in bold - basal rates: 12 am: 1.0  3 am: 0.9  6 am: 0.7  9 am: 0.8 >> 1 9 PM: 1.1  - ICR:   12 AM: 1: 17 >> 1:14 8 PM: 1: 9 (try to start this bolus 45-60 min before the meal) - target: 120-120  - ISF: 1:60 - Active insulin time: 6 hours  Also, continue: - Jardiance 10 mg before b'fast -restarted 11/2022 -  -started 11/2022 >> stopped 2/2 severe constipation  TDD from basal insulin: 61% >> 67-78 %>> 66% >> 63% TDD from bolus insulin: 39% >> 22-33% >> 33% >> 38% Total daily dose: 150-170 units/day - extended bolusing:  not using - changes infusion site: q1-2 days  She checks her sugars more than 4 times a day with her  Dexcom CGM:  Previously:  Previously:  Lowest sugar was LO >> ... 55 >> 47; she has hypoglycemia awareness at 110.  Highest sugar was HI ... >> 300s >> 400s  Glucometer: One Touch Verio  Pt's meals are: - Breakfast: usually skips, when eats b'fast: sausage bisquit - Lunch: salad or sandwich - Dinner: meat and 2 veggies - Snacks: 1-2: apples, oranges, occasionally chips  -+ CKD, last BUN/creatinine:  Lab Results  Component Value Date   BUN 22 10/05/2022   BUN 14 09/15/2021   CREATININE 0.85 10/05/2022   CREATININE 0.93 09/15/2021  She is  not on ACE inhibitor/ARB. She had cough with lisinopril.  -+ HL; last set of lipids: Lab Results  Component Value Date   CHOL 165 06/27/2022   HDL 51 06/27/2022   LDLCALC 79 06/27/2022   LDLDIRECT 105.0 12/22/2019   TRIG 176 (H) 06/27/2022   CHOLHDL 3.2 06/27/2022  On Crestor 5 mg  daily.  - last eye exam was in 2023: No DR reportedly. She has glaucoma - exams q6 mo. Her blurry vision improved (had laser surgery for glaucoma 05/2021).   - She has numbness and tingling in her toes. She also has muscle cramps in her feet.  Last foot exam 03/07/2022.  Pt has FH of DM in mother, son, daughter, sister, MGM, MGF, M aunt and uncles.  She also has a history of HTN, fatty liver, GERD. She has psoriatic arthritis and ankylosing spondylitis. She has significant joint pain - hands, back, hips.  She is on a biologic infusion (Cosentyx). She presented to the ED for nausea on 12/24/2019.  Per review of the labs, she appeared dehydrated, but no distinct pathology was found.  Of note, lipase was normal. She started Lopressor for HTN - but had dizziness >> stopped.  TSH was normal: Lab Results  Component Value Date   TSH 0.57 10/05/2022   ROS: + see HPI  I reviewed pt's medications, allergies, PMH, social hx, family hx, and changes were documented in the history of present illness. Otherwise, unchanged from my initial visit note.  Past Medical History:  Diagnosis Date   Actinic keratosis    Allergy    Arthritis    hip, back, shoulders, joints   Asthma    Complication of anesthesia    problems with block during breast augmentation and epidural during childbirth   Depression    Diabetes mellitus 09/2011   type 2   Family history of adverse reaction to anesthesia    PROBLEMS WITH BLOCKS   Fatty liver    GERD (gastroesophageal reflux disease)    Glaucoma suspect    Headache    migraines-4x a week headache-1x week migraine   History of hiatal hernia    Hyperlipidemia    Hypertension     Controlled on meds   Mitral valve prolapse    Nose colonized with MRSA 05/15/2018   Osteopenia    Pneumonia    PONV (postoperative nausea and vomiting)    Psoriasis    Shingles 11/2006   Sleep disturbance    Tachycardia    Vertigo    Hx of   Past Surgical History:  Procedure Laterality Date   ABDOMINAL HYSTERECTOMY  2000   /BSO - abn. Paps   APPENDECTOMY  1978   AUGMENTATION MAMMAPLASTY Bilateral 1981   BLADDER REPAIR  x2   BREAST ENHANCEMENT SURGERY  1987   CARDIAC CATHETERIZATION     [facility]   CARDIAC CATHETERIZATION Left 12/18/2016   Procedure: Left Heart Cath and Coronary Angiography;  Surgeon: Iran Ouch, MD;  Location: [facility] IN[facility];  Service: Cardiovascular;  Laterality: Left;   CARPAL TUNNEL RELEASE Bilateral    CERVICAL DISCECTOMY  11/2009   with synthetic discs inserted--  Dr Lovell Sheehan   COLONOSCOPY     JOINT REPLACEMENT     NM MYOVIEW LTD  03/2005   EF 56%, no ischemia   RECTAL PROLAPSE REPAIR     x1   SHOULDER ARTHROSCOPY WITH OPEN ROTATOR CUFF REPAIR Left 12/29/2019   Procedure: LEFT SHOULDER ARTHROSCOPIC DISTAL CLAVICLE EXCISISON, SUBACROMIAL DECOMPRESSION,BICEPS TENOTOMY,ROTATOR CUFF REPAIR ;  Surgeon: Lyndle Herrlich, MD;  Location: [facility] OR[facility];  Service: Orthopedics;  Laterality: Left;   SHOULDER CLOSED REDUCTION Left 05/03/2020   Procedure: Left shoulder arthroscopic lysis of adhesions and manipulation under anesthesia;  Surgeon: Lyndle Herrlich, MD;  Location: [facility] OR[facility];  Service: Orthopedics;  Laterality: Left;   SHOULDER SURGERY  09/2008, 01/2009   Right shoulder surgery, then redone with lysis of adhesions   TOTAL HIP ARTHROPLASTY Right 05/29/2018   Procedure: TOTAL HIP ARTHROPLASTY ANTERIOR APPROACH;  Surgeon: Lyndle Herrlich, MD;  Location: [facility] OR[facility];  Service: Orthopedics;  Laterality: Right;   UPPER GI ENDOSCOPY     Social History   Socioeconomic History   Marital status: Married    Spouse name: Not on file   Number of children: 5    Years of education: Not on file   Highest education level: Not on file  Occupational History   Occupation: homemaker  Tobacco Use   Smoking status: Never    Passive exposure: Past   Smokeless tobacco: Never  Vaping Use   Vaping Use: Never used  Substance and Sexual Activity   Alcohol use: No   Drug use: No   Sexual activity: Not on file  Other Topics Concern   Not on file  Social History Narrative   Home maker   Social Determinants of Health   Financial Resource Strain: Not on file  Food Insecurity: Not on file  Transportation Needs: Not on file  Physical Activity: Not on file  Stress: Not on file  Social Connections: Not on file  Intimate Partner Violence: Not on file   Current Outpatient Medications on File Prior to Visit  Medication Sig Dispense Refill   aspirin EC 81 MG tablet Take 81 mg by mouth 2 (two) times daily.     aspirin-acetaminophen-caffeine (EXCEDRIN MIGRAINE) 250-250-65 MG tablet Take 1 tablet by mouth every 6 (six) hours as needed for headache. (Patient not taking: Reported on 02/06/2023)     Blood Glucose Monitoring Suppl (FIFTY50 GLUCOSE METER 2.0) w/Device KIT OneTouch Ultra2 Meter kit     cetirizine (ZYRTEC) 10 MG tablet Take 10 mg by mouth daily.      clobetasol (TEMOVATE) 0.05 % external solution Patient to mix in jar of CeraVe Cream and apply to back twice daily until itch improved. Avoid face, groin, axilla. 50 mL 1   Continuous Blood Gluc Sensor (DEXCOM G6 SENSOR) MISC CHANGE 1 SENSOR EVERY 10  DAYS AS DIRECTED 9 each 3   Continuous Blood Gluc Transmit (DEXCOM G6 TRANSMITTER) MISC 1 Device by Does not apply route every 3 (three) months. 1 each 1   docusate sodium (COLACE) 100 MG capsule Take 300 mg by mouth at bedtime.  DULoxetine (CYMBALTA) 30 MG capsule TAKE 1 CAPSULE BY MOUTH DAILY 90 capsule 1   empagliflozin (JARDIANCE) 10 MG TABS tablet Take 1 tablet (10 mg total) by mouth daily before breakfast. 90 tablet 3   Fluocinolone Acetonide 0.01 %  OIL Apply 1-2 gtts to ears once to twice daily as needed until improved. 20 mL 2   fluticasone (FLONASE) 50 MCG/ACT nasal spray Place 1 spray into both nostrils daily.     furosemide (LASIX) 20 MG tablet Take 1 tablet by mouth once daily 30 tablet 5   gabapentin (NEURONTIN) 100 MG capsule Take 100 mg by mouth 2 (two) times daily.     Glucagon 3 MG/DOSE POWD Place 3 mg into the nose once as needed for up to 1 dose. (Patient not taking: Reported on 02/06/2023) 1 each 11   HYDROcodone-acetaminophen (NORCO/VICODIN) 5-325 MG tablet Take 1 tablet by mouth every 4 (four) hours as needed for moderate pain. 60 tablet 0   Insulin Disposable Pump (OMNIPOD 5 G6 POD, GEN 5,) MISC 1 each by Does not apply route every other day. 15 each 11   insulin regular human CONCENTRATED (HUMULIN R) 500 UNIT/ML injection USE UP TO 200 UNITS OF CONCENTRATED INSULIN IN THE INSULIN PUMP DAILY 20 mL 3   LUMIGAN 0.01 % SOLN Place 1 drop into both eyes at bedtime.      methocarbamol (ROBAXIN) 500 MG tablet Take 500 mg by mouth every 8 (eight) hours as needed.     metoprolol succinate (TOPROL-XL) 25 MG 24 hr tablet TAKE 1 AND 1/2 TABLETS BY MOUTH  DAILY 135 tablet 3   ondansetron (ZOFRAN-ODT) 4 MG disintegrating tablet DISSOLVE 1 TABLET ON THE TONGUE  EVERY 6 HOURS AS NEEDED FOR  NAUSEA AND VOMITING 180 tablet 0   ONETOUCH DELICA LANCETS FINE MISC Check blood sugar once daily and as needed. 250.00 100 each 1   ONETOUCH ULTRA test strip USE TO CHECK BLOOD SUGAR 2  TIMES DAILY 200 strip 3   pantoprazole (PROTONIX) 40 MG tablet TAKE 1 TABLET BY MOUTH  TWICE DAILY ON AN EMPTY  STOMACH 180 tablet 3   rosuvastatin (CRESTOR) 5 MG tablet Take 1 tablet (5 mg total) by mouth daily. Please call [phone] to schedule yearly follow up visit. Thank you. 90 tablet 0   sucralfate (CARAFATE) 1 g tablet Take 1 tablet (1 g total) by mouth in the morning, at noon, and at bedtime. 270 tablet 3   TALTZ 80 MG/ML SOAJ Inject into the skin.     traZODone  (DESYREL) 100 MG tablet TAKE 2 TABLETS BY MOUTH  EVERY NIGHT AT BEDTIME 180 tablet 3   triamterene-hydrochlorothiazide (MAXZIDE-25) 37.5-25 MG tablet TAKE 1 TABLET BY MOUTH ONCE  DAILY 90 tablet 3   valACYclovir (VALTREX) 1000 MG tablet TAKE 2 TABLETS BY MOUTH  AT ONSET OF SYMPTOMS AS NEEDED FOR FEVER BLISTERS AND TAKE 2 TABLETS 12 HOURS LATER. 90 tablet 0   No current facility-administered medications on file prior to visit.   Allergies  Allergen Reactions   Humalog [Insulin Lispro] Swelling   Lisinopril Cough   Nitrofurantoin Other (See Comments)    Pt unsure   Oxycodone-Acetaminophen Itching   Percocet [Oxycodone-Acetaminophen]    Atorvastatin Diarrhea    May have caused colitis--but not clear cut   Clarithromycin Palpitations   Glipizide Other (See Comments)    Sugars too variable   Invokana [Canagliflozin]     Nausea, constipation   Family History  Problem Relation Age of Onset  Diabetes Mother    Heart disease Mother    Hypertension Mother    Ovarian cancer Mother    Melanoma Mother    Cancer Father    Coronary artery disease Father    Heart disease Father    Diabetes Sister    Depression Sister    Coronary artery disease Sister    Heart disease Sister    Pulmonary Hypertension Sister    Rheum arthritis Maternal Aunt    Heart attack Paternal Uncle    Heart attack Paternal Uncle    Breast cancer Maternal Grandmother    Diabetes Maternal Grandmother    Diabetes Maternal Grandfather    Heart attack Cousin    Colon cancer Neg Hx    PE: BP 124/72 (BP Location: Left Arm, Patient Position: Sitting, Cuff Size: Normal)   Pulse 72   Ht 5\' 4"  (1.626 m)   Wt 173 lb 6.4 oz (78.7 kg)   SpO2 94%   BMI 29.76 kg/m  Wt Readings from Last 3 Encounters:  03/06/23 173 lb 6.4 oz (78.7 kg)  11/23/22 177 lb (80.3 kg)  10/19/22 172 lb 9.6 oz (78.3 kg)   Constitutional: normal weight, in NAD Eyes: EOMI, no exophthalmos ENT: no thyromegaly, no cervical  lymphadenopathy Cardiovascular: RRR, No MRG Respiratory: CTA B Musculoskeletal: no deformities Skin: no rashes Neurological: no tremor with outstretched hands Diabetic Foot Exam - Simple   Simple Foot Form Diabetic Foot exam was performed with the following findings: Yes 03/06/2023 11:41 AM  Visual Inspection No deformities, no ulcerations, no other skin breakdown bilaterally: Yes Sensation Testing Intact to touch and monofilament testing bilaterally: Yes Pulse Check Posterior Tibialis and Dorsalis pulse intact bilaterally: Yes Comments + dry skin B     ASSESSMENT: 1. DM2, insulin-dependent, uncontrolled, without long-term complications, but with significant hyperglycemia  2. HL  PLAN:  1. Patient with poorly controlled type 2 diabetes, insulin resistant, previously on a basal/bolus insulin regimen with very high blood sugars, improved on the U-500 insulin regimen.  We tried to add Ozempic in the past but had to stop due to constant nausea.  At last visit, I suggested to try Rybelsus.  She tried this but she developed severe constipation and had to stop.  She was tolerating Farxiga/Jardiance well but she was off at last visit, after running out.  I refilled this for her.  She is on already has now. -At last visit, sugars were quite fluctuating, with significant increases in blood sugars after breakfast and persistent higher blood sugars after the rest of the meals.  Sugars were occasionally dropping during the night.  Upon reviewing her pump settings, she did not appear to have made any of the insulin pump setting changes recommended at the previous visit.  I recommended no changes in her pump settings, but as mentioned above, we started the SGLT2 inhibitor.  HbA1c was better at last visit, and 7.3%, but this was not correlating with the blood sugars at home. CGM interpretation: -At today's visit, we reviewed her CGM downloads: It appears that 58% of values are in target range (goal  >70%), while 42% are higher than 180 (goal <25%), and 0% are lower than 70 (goal <4%).  The calculated average blood sugar is 177.  The projected HbA1c for the next 3 months (GMI) is 7.6%. -Reviewing the CGM trends, sugars appear to be increasing after breakfast, then more significantly after lunch, and again after dinner, with a hyperglycemic peak between 12 AM and 1 AM. -At  today's visit, she tells me that she is trying to inject dose U-500 insulin approximately 30 minutes before the meal, but lately she has not been able to do this before dinner.  She is currently injecting it approximately 10 minutes from the meal.  This may be the reason why sugars are higher after breakfast and a snack at night and we discussed about strengthening the insulin to carb ratios for these meals and trying her best to take it longer before the meal.  Since sugars after dinner are actually slightly lower, will keep the same insulin to carb ratio for this meal.  I do not think we need to change any other pump settings.  Overall, the sugars look much better and I believe that this is also due to the fact that she is eating better.  I strongly advised her to continue. -I suggested: Patient Instructions  Please use the following pump settings: - basal rates: 12 am: 1.0  3 am: 0.9  6 am: 0.7  9 am: 1 9 PM: 1.1  - ICR:  (try to start the bolus 45-60 min before the meal)  12 AM: 1: 14 >> 1:11  4 PM: 1:14 8 PM: 1: 9 >> 1:8 - target: 120-120  - ISF: 1:60 - Active insulin time: 6 hours  Also, continue: - Jardiance 10 mg before b'fast.  Please return in 3-4 months.  - we checked her HbA1c: 6.9% (lower in 10 years!!) - advised to check sugars at different times of the day - 4x a day, rotating check times - advised for yearly eye exams >> she is UTD - return to clinic in 3-4 months  2. HL -Reviewed latest lipid panel from 06/2022: LDL above target, triglycerides slightly high: Lab Results  Component Value Date    CHOL 165 06/27/2022   HDL 51 06/27/2022   LDLCALC 79 06/27/2022   LDLDIRECT 105.0 12/22/2019   TRIG 176 (H) 06/27/2022   CHOLHDL 3.2 06/27/2022  -She continues on Crestor 5 mg daily without side effects  Carlus Pavlov, MD PhD [facility] Endocrinology

## 2023-03-07 ENCOUNTER — Other Ambulatory Visit: Payer: Self-pay | Admitting: Internal Medicine

## 2023-03-07 ENCOUNTER — Ambulatory Visit: Payer: 59 | Admitting: Dermatology

## 2023-03-12 ENCOUNTER — Encounter: Payer: Self-pay | Admitting: Internal Medicine

## 2023-03-12 ENCOUNTER — Ambulatory Visit (INDEPENDENT_AMBULATORY_CARE_PROVIDER_SITE_OTHER): Payer: 59 | Admitting: Internal Medicine

## 2023-03-12 VITALS — BP 122/78 | HR 100 | Temp 98.0°F | Ht 64.5 in | Wt 172.0 lb

## 2023-03-12 DIAGNOSIS — R5383 Other fatigue: Secondary | ICD-10-CM

## 2023-03-12 DIAGNOSIS — E1149 Type 2 diabetes mellitus with other diabetic neurological complication: Secondary | ICD-10-CM | POA: Diagnosis not present

## 2023-03-12 DIAGNOSIS — L4050 Arthropathic psoriasis, unspecified: Secondary | ICD-10-CM | POA: Diagnosis not present

## 2023-03-12 DIAGNOSIS — Z0000 Encounter for general adult medical examination without abnormal findings: Secondary | ICD-10-CM

## 2023-03-12 DIAGNOSIS — Z794 Long term (current) use of insulin: Secondary | ICD-10-CM

## 2023-03-12 DIAGNOSIS — F39 Unspecified mood [affective] disorder: Secondary | ICD-10-CM

## 2023-03-12 DIAGNOSIS — Z7984 Long term (current) use of oral hypoglycemic drugs: Secondary | ICD-10-CM

## 2023-03-12 LAB — HM DIABETES FOOT EXAM

## 2023-03-12 NOTE — Assessment & Plan Note (Signed)
Lab Results  Component Value Date   HGBA1C 6.9 (A) 03/06/2023   Now with good control Works with Dr Elvera Lennox On duloxetine 

## 2023-03-12 NOTE — Assessment & Plan Note (Signed)
Grieving Reactive depression with pain On the duloxetine

## 2023-03-12 NOTE — Progress Notes (Signed)
Subjective:    Patient ID: Shannon [name], female    DOB: 06/28/1956, 67 y.o.   MRN: [id number]  HPI Here for physical  Finally has diabetes under control Very happy about this Now may try to get off U500--could be causing weight gain  Still is fatgued Has gained 10# since last year--and up 25# overall Mom died last 2023/07/26 and has multiple other losses Just had left THR last month---recovering from that  Has chronic pain--despite the taltz That is certainly very draining Rheum had planned starting methotrexate---but hadn't started that  Did have OSA diagnosed Now using CPAP for past 11 nights Felt she had slept before but seems to be better now---AHI 56!  Current Outpatient Medications on File Prior to Visit  Medication Sig Dispense Refill   aspirin-acetaminophen-caffeine (EXCEDRIN MIGRAINE) 250-250-65 MG tablet Take 1 tablet by mouth every 6 (six) hours as needed for headache.     Blood Glucose Monitoring Suppl (FIFTY50 GLUCOSE METER 2.0) w/Device KIT OneTouch Ultra2 Meter kit     cetirizine (ZYRTEC) 10 MG tablet Take 10 mg by mouth daily.      clobetasol (TEMOVATE) 0.05 % external solution Patient to mix in jar of CeraVe Cream and apply to back twice daily until itch improved. Avoid face, groin, axilla. 50 mL 1   Continuous Blood Gluc Sensor (DEXCOM G6 SENSOR) MISC CHANGE 1 SENSOR EVERY 10  DAYS AS DIRECTED 9 each 3   Continuous Blood Gluc Transmit (DEXCOM G6 TRANSMITTER) MISC 1 Device by Does not apply route every 3 (three) months. 1 each 1   docusate sodium (COLACE) 100 MG capsule Take 300 mg by mouth at bedtime.      DULoxetine (CYMBALTA) 30 MG capsule TAKE 1 CAPSULE BY MOUTH DAILY 90 capsule 1   empagliflozin (JARDIANCE) 10 MG TABS tablet Take 1 tablet (10 mg total) by mouth daily before breakfast. 90 tablet 3   Fluocinolone Acetonide 0.01 % OIL Apply 1-2 gtts to ears once to twice daily as needed until improved. 20 mL 2   fluticasone (FLONASE) 50 MCG/ACT nasal spray  Place 1 spray into both nostrils daily.     furosemide (LASIX) 20 MG tablet Take 1 tablet by mouth once daily 30 tablet 5   gabapentin (NEURONTIN) 100 MG capsule Take 100 mg by mouth 2 (two) times daily.     Glucagon 3 MG/DOSE POWD Place 3 mg into the nose once as needed for up to 1 dose. 1 each 11   HYDROcodone-acetaminophen (NORCO/VICODIN) 5-325 MG tablet Take 1 tablet by mouth every 4 (four) hours as needed for moderate pain. 60 tablet 0   Insulin Disposable Pump (OMNIPOD 5 G6 INTRO, GEN 5,) KIT USE AS DIRECTED 1 kit 0   Insulin Disposable Pump (OMNIPOD 5 G6 POD, GEN 5,) MISC 1 each by Does not apply route every other day. 15 each 11   insulin regular human CONCENTRATED (HUMULIN R) 500 UNIT/ML injection USE UP TO 200 UNITS OF CONCENTRATED INSULIN IN THE INSULIN PUMP DAILY 20 mL 3   LUMIGAN 0.01 % SOLN Place 1 drop into both eyes at bedtime.      methocarbamol (ROBAXIN) 500 MG tablet Take 500 mg by mouth every 8 (eight) hours as needed.     metoprolol succinate (TOPROL-XL) 25 MG 24 hr tablet TAKE 1 AND 1/2 TABLETS BY MOUTH  DAILY 135 tablet 3   ondansetron (ZOFRAN-ODT) 4 MG disintegrating tablet DISSOLVE 1 TABLET ON THE TONGUE  EVERY 6 HOURS AS NEEDED FOR  NAUSEA AND VOMITING 180 tablet 0   ONETOUCH DELICA LANCETS FINE MISC Check blood sugar once daily and as needed. 250.00 100 each 1   ONETOUCH ULTRA test strip USE TO CHECK BLOOD SUGAR 2  TIMES DAILY 200 strip 3   pantoprazole (PROTONIX) 40 MG tablet TAKE 1 TABLET BY MOUTH  TWICE DAILY ON AN EMPTY  STOMACH 180 tablet 3   rosuvastatin (CRESTOR) 5 MG tablet Take 1 tablet (5 mg total) by mouth daily. Please call [phone] to schedule yearly follow up visit. Thank you. 90 tablet 0   sucralfate (CARAFATE) 1 g tablet Take 1 tablet (1 g total) by mouth in the morning, at noon, and at bedtime. 270 tablet 3   TALTZ 80 MG/ML SOAJ Inject into the skin.     traZODone (DESYREL) 100 MG tablet TAKE 2 TABLETS BY MOUTH  EVERY NIGHT AT BEDTIME 180 tablet 3    triamterene-hydrochlorothiazide (MAXZIDE-25) 37.5-25 MG tablet TAKE 1 TABLET BY MOUTH ONCE  DAILY 90 tablet 3   valACYclovir (VALTREX) 1000 MG tablet TAKE 2 TABLETS BY MOUTH  AT ONSET OF SYMPTOMS AS NEEDED FOR FEVER BLISTERS AND TAKE 2 TABLETS 12 HOURS LATER. 90 tablet 0   No current facility-administered medications on file prior to visit.    Allergies  Allergen Reactions   Humalog [Insulin Lispro] Swelling   Lisinopril Cough   Nitrofurantoin Other (See Comments)    Pt unsure   Oxycodone-Acetaminophen Itching   Percocet [Oxycodone-Acetaminophen]    Atorvastatin Diarrhea    May have caused colitis--but not clear cut   Clarithromycin Palpitations   Glipizide Other (See Comments)    Sugars too variable   Invokana [Canagliflozin]     Nausea, constipation    Past Medical History:  Diagnosis Date   Actinic keratosis    Allergy    Arthritis    hip, back, shoulders, joints   Asthma    Complication of anesthesia    problems with block during breast augmentation and epidural during childbirth   Depression    Diabetes mellitus 09/2011   type 2   Family history of adverse reaction to anesthesia    PROBLEMS WITH BLOCKS   Fatty liver    GERD (gastroesophageal reflux disease)    Glaucoma suspect    Headache    migraines-4x a week headache-1x week migraine   History of hiatal hernia    Hyperlipidemia    Hypertension    Controlled on meds   Mitral valve prolapse    Nose colonized with MRSA 05/15/2018   Osteopenia    Pneumonia    PONV (postoperative nausea and vomiting)    Psoriasis    Shingles 11/2006   Sleep disturbance    Tachycardia    Vertigo    Hx of    Past Surgical History:  Procedure Laterality Date   ABDOMINAL HYSTERECTOMY  2000   /BSO - abn. Paps   APPENDECTOMY  1978   AUGMENTATION MAMMAPLASTY Bilateral 1981   BLADDER REPAIR     x2   BREAST ENHANCEMENT SURGERY  1987   CARDIAC CATHETERIZATION     [facility]   CARDIAC CATHETERIZATION Left 12/18/2016   Procedure:  Left Heart Cath and Coronary Angiography;  Surgeon: Iran Ouch, MD;  Location: [facility] INVASIVE CV [facility];  Service: Cardiovascular;  Laterality: Left;   CARPAL TUNNEL RELEASE Bilateral    CERVICAL DISCECTOMY  11/2009   with synthetic discs inserted--  Dr Lovell Sheehan   COLONOSCOPY     JOINT REPLACEMENT  Left Hip Replacement Left 02/01/2023   NM MYOVIEW LTD  03/2005   EF 56%, no ischemia   RECTAL PROLAPSE REPAIR     x1   SHOULDER ARTHROSCOPY WITH OPEN ROTATOR CUFF REPAIR Left 12/29/2019   Procedure: LEFT SHOULDER ARTHROSCOPIC DISTAL CLAVICLE EXCISISON, SUBACROMIAL DECOMPRESSION,BICEPS TENOTOMY,ROTATOR CUFF REPAIR ;  Surgeon: Lyndle Herrlich, MD;  Location: [facility] ORS;  Service: Orthopedics;  Laterality: Left;   SHOULDER CLOSED REDUCTION Left 05/03/2020   Procedure: Left shoulder arthroscopic lysis of adhesions and manipulation under anesthesia;  Surgeon: Lyndle Herrlich, MD;  Location: [facility] ORS;  Service: Orthopedics;  Laterality: Left;   SHOULDER SURGERY  09/2008, 01/2009   Right shoulder surgery, then redone with lysis of adhesions   TOTAL HIP ARTHROPLASTY Right 05/29/2018   Procedure: TOTAL HIP ARTHROPLASTY ANTERIOR APPROACH;  Surgeon: Lyndle Herrlich, MD;  Location: [facility] ORS;  Service: Orthopedics;  Laterality: Right;   TOTAL HIP ARTHROPLASTY Left 01/2023   UPPER GI ENDOSCOPY      Family History  Problem Relation Age of Onset   Diabetes Mother    Heart disease Mother    Hypertension Mother    Ovarian cancer Mother    Melanoma Mother    Cancer Father    Coronary artery disease Father    Heart disease Father    Diabetes Sister    Depression Sister    Coronary artery disease Sister    Heart disease Sister    Pulmonary Hypertension Sister    Rheum arthritis Maternal Aunt    Heart attack Paternal Uncle    Heart attack Paternal Uncle    Breast cancer Maternal Grandmother    Diabetes Maternal Grandmother    Diabetes Maternal Grandfather    Heart attack Cousin    Colon cancer Neg  Hx     Social History   Socioeconomic History   Marital status: Married    Spouse name: Not on file   Number of children: 5   Years of education: Not on file   Highest education level: Not on file  Occupational History   Occupation: homemaker  Tobacco Use   Smoking status: Never    Passive exposure: Past   Smokeless tobacco: Never  Vaping Use   Vaping Use: Never used  Substance and Sexual Activity   Alcohol use: No   Drug use: No   Sexual activity: Not on file  Other Topics Concern   Not on file  Social History Narrative   Home maker   Social Determinants of Health   Financial Resource Strain: Not on file  Food Insecurity: Not on file  Transportation Needs: Not on file  Physical Activity: Not on file  Stress: Not on file  Social Connections: Not on file  Intimate Partner Violence: Not on file   Review of Systems  Constitutional:  Positive for fatigue and unexpected weight change.       Wears seat belt  HENT:  Positive for tinnitus.        Some hearing loss Teeth are okay--keeps up with dentist  Eyes:  Negative for visual disturbance.       Sees eye doctor every 6 months---asked for note (had laser glaucoma procedures)  Respiratory:  Negative for cough, chest tightness and shortness of breath.   Cardiovascular:  Positive for leg swelling. Negative for chest pain and palpitations.  Gastrointestinal:  Negative for blood in stool and constipation.       No heartburn on bid protonix  Endocrine: Positive for polydipsia. Negative  for polyuria.  Genitourinary:  Negative for dysuria and hematuria.       Dyspareunia is better---still using lubricant  Musculoskeletal:  Positive for arthralgias and back pain.  Skin:  Negative for rash.  Allergic/Immunologic: Positive for environmental allergies. Negative for immunocompromised state.       Zyrtec and flonase and another nasal spray  Neurological:  Positive for headaches. Negative for dizziness, syncope and  light-headedness.  Hematological:  Negative for adenopathy. Does not bruise/bleed easily.  Psychiatric/Behavioral:  Positive for sleep disturbance. The patient is not nervous/anxious.        Still grieving has feeling of being "down" other than that       Objective:   Physical Exam Constitutional:      Appearance: Normal appearance.  HENT:     Mouth/Throat:     Pharynx: No oropharyngeal exudate or posterior oropharyngeal erythema.  Eyes:     Conjunctiva/sclera: Conjunctivae normal.     Pupils: Pupils are equal, round, and reactive to light.  Cardiovascular:     Rate and Rhythm: Normal rate and regular rhythm.     Pulses: Normal pulses.     Heart sounds: No murmur heard.    No gallop.  Pulmonary:     Effort: Pulmonary effort is normal.     Breath sounds: Normal breath sounds. No wheezing or rales.  Abdominal:     Palpations: Abdomen is soft.     Tenderness: There is no abdominal tenderness.  Musculoskeletal:     Cervical back: Neck supple.     Right lower leg: No edema.     Left lower leg: No edema.  Lymphadenopathy:     Cervical: No cervical adenopathy.  Skin:    Findings: No rash.     Comments: No foot lesions  Neurological:     General: No focal deficit present.     Mental Status: She is alert and oriented to person, place, and time.     Comments: Normal sensation in feet  Psychiatric:        Mood and Affect: Mood normal.        Behavior: Behavior normal.            Assessment & Plan:

## 2023-03-12 NOTE — Assessment & Plan Note (Signed)
Multiple health concerns Overdue for mammogram--she will set this up Colon normal in 2016--but has FH. She will check with [facility] to see when she is due No pap due to hyster Flu vaccine in the fall Will consider updated COVID vaccine

## 2023-03-12 NOTE — Assessment & Plan Note (Signed)
On taltz but still with chronic pain Getting hydrocodone from surgeon--I will probably take this back over soon

## 2023-03-12 NOTE — Assessment & Plan Note (Signed)
Multifactorial If not feeling better with the CPAP after 1-2 months, will consider increasing duloxetine to  for the mood portion of it

## 2023-03-13 LAB — LDL CHOLESTEROL, DIRECT: Direct LDL: 116 mg/dL

## 2023-03-13 LAB — CBC
HCT: 43 % (ref 36.0–46.0)
Hemoglobin: 13.9 g/dL (ref 12.0–15.0)
MCHC: 32.2 g/dL (ref 30.0–36.0)
MCV: 82.3 fl (ref 78.0–100.0)
Platelets: 364 10*3/uL (ref 150.0–400.0)
RBC: 5.22 Mil/uL — ABNORMAL HIGH (ref 3.87–5.11)
RDW: 15 % (ref 11.5–15.5)
WBC: 11.1 10*3/uL — ABNORMAL HIGH (ref 4.0–10.5)

## 2023-03-13 LAB — RENAL FUNCTION PANEL
Albumin: 4.7 g/dL (ref 3.5–5.2)
BUN: 25 mg/dL — ABNORMAL HIGH (ref 6–23)
CO2: 28 mEq/L (ref 19–32)
Calcium: 10.4 mg/dL (ref 8.4–10.5)
Chloride: 97 mEq/L (ref 96–112)
Creatinine, Ser: 0.99 mg/dL (ref 0.40–1.20)
GFR: 60.01 mL/min (ref >60.00)
Glucose, Bld: 128 mg/dL — ABNORMAL HIGH (ref 70–99)
Phosphorus: 3.6 mg/dL (ref 2.3–4.6)
Potassium: 3.2 mEq/L — ABNORMAL LOW (ref 3.5–5.1)
Sodium: 139 mEq/L (ref 135–145)

## 2023-03-13 LAB — HEPATIC FUNCTION PANEL
ALT: 31 U/L (ref 0–35)
AST: 29 U/L (ref 0–37)
Albumin: 4.7 g/dL (ref 3.5–5.2)
Alkaline Phosphatase: 116 U/L (ref 39–117)
Bilirubin, Direct: 0.1 mg/dL (ref 0.0–0.3)
Total Bilirubin: 0.4 mg/dL (ref 0.2–1.2)
Total Protein: 7.8 g/dL (ref 6.0–8.3)

## 2023-03-13 LAB — MICROALBUMIN / CREATININE URINE RATIO
Creatinine,U: 19.1 mg/dL
Microalb Creat Ratio: 3.7 mg/g (ref 0.0–30.0)
Microalb, Ur: <0.7 mg/dL (ref 0.0–1.9)

## 2023-03-13 LAB — LIPID PANEL
Cholesterol: 187 mg/dL (ref 0–200)
HDL: 50.6 mg/dL (ref >39.00)
NonHDL: 136.11
Total CHOL/HDL Ratio: 4
Triglycerides: 229 mg/dL — ABNORMAL HIGH (ref 0.0–149.0)
VLDL: 45.8 mg/dL — ABNORMAL HIGH (ref 0.0–40.0)

## 2023-03-13 MED ORDER — AZELASTINE HCL 0.1 % NA SOLN: 1.0000 | Freq: Two times a day (BID) | NASAL | 12 refills | Status: DC

## 2023-03-15 ENCOUNTER — Other Ambulatory Visit: Payer: Self-pay

## 2023-03-15 MED ORDER — POTASSIUM CHLORIDE CRYS ER 20 MEQ PO TBCR: 20.0000 meq | EXTENDED_RELEASE_TABLET | Freq: Every day | ORAL | 11 refills | Status: DC

## 2023-03-16 ENCOUNTER — Other Ambulatory Visit: Payer: Self-pay | Admitting: Internal Medicine

## 2023-03-16 DIAGNOSIS — Z1231 Encounter for screening mammogram for malignant neoplasm of breast: Secondary | ICD-10-CM

## 2023-03-25 ENCOUNTER — Other Ambulatory Visit: Payer: Self-pay | Admitting: Internal Medicine

## 2023-03-25 DIAGNOSIS — E1142 Type 2 diabetes mellitus with diabetic polyneuropathy: Secondary | ICD-10-CM

## 2023-03-26 ENCOUNTER — Other Ambulatory Visit: Payer: Self-pay | Admitting: Internal Medicine

## 2023-03-26 DIAGNOSIS — E876 Hypokalemia: Secondary | ICD-10-CM

## 2023-03-28 ENCOUNTER — Ambulatory Visit: Payer: 59 | Admitting: Dermatology

## 2023-03-28 ENCOUNTER — Encounter: Payer: Self-pay | Admitting: Dermatology

## 2023-03-28 VITALS — BP 137/78 | HR 94

## 2023-03-28 DIAGNOSIS — L821 Other seborrheic keratosis: Secondary | ICD-10-CM

## 2023-03-28 DIAGNOSIS — L820 Inflamed seborrheic keratosis: Secondary | ICD-10-CM

## 2023-03-28 DIAGNOSIS — L578 Other skin changes due to chronic exposure to nonionizing radiation: Secondary | ICD-10-CM | POA: Diagnosis not present

## 2023-03-28 NOTE — Progress Notes (Signed)
   Follow-Up Visit   Subjective  Shannon [name] is a 67 y.o. female who presents for the following: Spots. Right arm, back, left arm, leg, neck. They itch and get irritated.  The patient has spots, moles and lesions to be evaluated, some may be new or changing and the patient has concerns that these could be cancer.    The following portions of the chart were reviewed this encounter and updated as appropriate: medications, allergies, medical history  Review of Systems:  No other skin or systemic complaints except as noted in HPI or Assessment and Plan.  Objective  Well appearing patient in no apparent distress; mood and affect are within normal limits.  A focused examination was performed of the following areas: Arms, back, legs   Relevant physical exam findings are noted in the Assessment and Plan.    Assessment & Plan   INFLAMED SEBORRHEIC KERATOSIS Exam: Erythematous keratotic or waxy stuck-on papule or plaque.  Symptomatic, irritating, patient would like treated.  Benign-appearing.  Call clinic for new or changing lesions.   Prior to procedure, discussed risks of blister formation, small wound, skin dyspigmentation, or rare scar following treatment. Recommend Vaseline ointment to treated areas while healing.  Destruction Procedure Note Destruction method: cryotherapy   Informed consent: discussed and consent obtained   Lesion destroyed using liquid nitrogen: Yes   Outcome: patient tolerated procedure well with no complications   Post-procedure details: wound care instructions given   Locations: left axilla x2, left forearm x2, left upper popliteal x1, right medial knee x1, right medial upper thigh x1, right forearm x2, right upper elbow x1, left lower neck x2, right neck x1, right upper chest x1, left mid lower back x2, left medial buttock x1, gluteal cleft x1, left chin x1 # of Lesions Treated: 19  SEBORRHEIC KERATOSIS - Stuck-on, waxy, tan-brown papules and/or  plaques  - Benign-appearing - Discussed benign etiology and prognosis. - Observe - Call for any changes  ACTINIC DAMAGE - chronic, secondary to cumulative UV radiation exposure/sun exposure over time - diffuse scaly erythematous macules with underlying dyspigmentation - Recommend daily broad spectrum sunscreen SPF 30+ to sun-exposed areas, reapply every 2 hours as needed.  - Recommend staying in the shade or wearing long sleeves, sun glasses (UVA+UVB protection) and wide brim hats (4-inch brim around the entire circumference of the hat). - Call for new or changing lesions.   Return if symptoms worsen or fail to improve.  I, Lawson Radar, CMA, am acting as scribe for Willeen Niece, MD.  Documentation: I have reviewed the above documentation for accuracy and completeness, and I agree with the above.  Willeen Niece, MD

## 2023-03-28 NOTE — Patient Instructions (Signed)
Cryotherapy Aftercare  Wash gently with soap and water everyday.   Apply Vaseline Jelly or Aquaphor daily until healed.    Recommend daily broad spectrum sunscreen SPF 30+ to sun-exposed areas, reapply every 2 hours as needed. Call for new or changing lesions.  Staying in the shade or wearing long sleeves, sun glasses (UVA+UVB protection) and wide brim hats (4-inch brim around the entire circumference of the hat) are also recommended for sun protection.    Due to recent changes in healthcare laws, you may see results of your pathology and/or laboratory studies on MyChart before the doctors have had a chance to review them. We understand that in some cases there may be results that are confusing or concerning to you. Please understand that not all results are received at the same time and often the doctors may need to interpret multiple results in order to provide you with the best plan of care or course of treatment. Therefore, we ask that you please give us 2 business days to thoroughly review all your results before contacting the office for clarification. Should we see a critical lab result, you will be contacted sooner.   If You Need Anything After Your Visit  If you have any questions or concerns for your doctor, please call our main line at [phone] and press option 4 to reach your doctor's medical assistant. If no one answers, please leave a voicemail as directed and we will return your call as soon as possible. Messages left after 4 pm will be answered the following business day.   You may also send us a message via MyChart. We typically respond to MyChart messages within 1-2 business days.  For prescription refills, please ask your pharmacy to contact our office. Our fax number is [phone].  If you have an urgent issue when the clinic is closed that cannot wait until the next business day, you can page your doctor at the number below.    Please note that while we do our best  to be available for urgent issues outside of office hours, we are not available 24/7.   If you have an urgent issue and are unable to reach us, you may choose to seek medical care at your doctor's office, retail clinic, urgent care center, or emergency room.  If you have a medical emergency, please immediately call 911 or go to the emergency department.  Pager Numbers  - Dr. Kowalski: [phone]  - Dr. Moye: [phone]  - Dr. Stewart: [phone]  In the event of inclement weather, please call our main line at [phone] for an update on the status of any delays or closures.  Dermatology Medication Tips: Please keep the boxes that topical medications come in in order to help keep track of the instructions about where and how to use these. Pharmacies typically print the medication instructions only on the boxes and not directly on the medication tubes.   If your medication is too expensive, please contact our office at [phone] option 4 or send us a message through MyChart.   We are unable to tell what your co-pay for medications will be in advance as this is different depending on your insurance coverage. However, we may be able to find a substitute medication at lower cost or fill out paperwork to get insurance to cover a needed medication.   If a prior authorization is required to get your medication covered by your insurance company, please allow us 1-2 business days to complete this process.    Drug prices often vary depending on where the prescription is filled and some pharmacies may offer cheaper prices.  The website www.goodrx.com contains coupons for medications through different pharmacies. The prices here do not account for what the cost may be with help from insurance (it may be cheaper with your insurance), but the website can give you the price if you did not use any insurance.  - You can print the associated coupon and take it with your prescription to the  pharmacy.  - You may also stop by our office during regular business hours and pick up a GoodRx coupon card.  - If you need your prescription sent electronically to a different pharmacy, notify our office through [facility] MyChart or by phone at [phone] option 4.     Si Usted Necesita Algo Despus de Su Visita  Tambin puede enviarnos un mensaje a travs de MyChart. Por lo general respondemos a los mensajes de MyChart en el transcurso de 1 a 2 das hbiles.  Para renovar recetas, por favor pida a su farmacia que se ponga en contacto con nuestra oficina. Nuestro nmero de fax es el [phone].  Si tiene un asunto urgente cuando la clnica est cerrada y que no puede esperar hasta el siguiente da hbil, puede llamar/localizar a su doctor(a) al nmero que aparece a continuacin.   Por favor, tenga en cuenta que aunque hacemos todo lo posible para estar disponibles para asuntos urgentes fuera del horario de oficina, no estamos disponibles las 24 horas del da, los 7 das de la semana.   Si tiene un problema urgente y no puede comunicarse con nosotros, puede optar por buscar atencin mdica  en el consultorio de su doctor(a), en una clnica privada, en un centro de atencin urgente o en una sala de emergencias.  Si tiene una emergencia mdica, por favor llame inmediatamente al 911 o vaya a la sala de emergencias.  Nmeros de bper  - Dr. Kowalski: [phone]  - Dra. Moye: [phone]  - Dra. Stewart: [phone]  En caso de inclemencias del tiempo, por favor llame a nuestra lnea principal al [phone] para una actualizacin sobre el estado de cualquier retraso o cierre.  Consejos para la medicacin en dermatologa: Por favor, guarde las cajas en las que vienen los medicamentos de uso tpico para ayudarle a seguir las instrucciones sobre dnde y cmo usarlos. Las farmacias generalmente imprimen las instrucciones del medicamento slo en las cajas y no directamente en los  tubos del medicamento.   Si su medicamento es muy caro, por favor, pngase en contacto con nuestra oficina llamando al [phone] y presione la opcin 4 o envenos un mensaje a travs de MyChart.   No podemos decirle cul ser su copago por los medicamentos por adelantado ya que esto es diferente dependiendo de la cobertura de su seguro. Sin embargo, es posible que podamos encontrar un medicamento sustituto a menor costo o llenar un formulario para que el seguro cubra el medicamento que se considera necesario.   Si se requiere una autorizacin previa para que su compaa de seguros cubra su medicamento, por favor permtanos de 1 a 2 das hbiles para completar este proceso.  Los precios de los medicamentos varan con frecuencia dependiendo del lugar de dnde se surte la receta y alguna farmacias pueden ofrecer precios ms baratos.  El sitio web www.goodrx.com tiene cupones para medicamentos de diferentes farmacias. Los precios aqu no tienen en cuenta lo que podra costar con la ayuda del seguro (puede ser ms   barato con su seguro), pero el sitio web puede darle el precio si no utiliz ningn seguro.  - Puede imprimir el cupn correspondiente y llevarlo con su receta a la farmacia.  - Tambin puede pasar por nuestra oficina durante el horario de atencin regular y recoger una tarjeta de cupones de GoodRx.  - Si necesita que su receta se enve electrnicamente a una farmacia diferente, informe a nuestra oficina a travs de MyChart de [facility] o por telfono llamando al [phone] y presione la opcin 4.  

## 2023-04-03 ENCOUNTER — Other Ambulatory Visit (INDEPENDENT_AMBULATORY_CARE_PROVIDER_SITE_OTHER): Payer: 59

## 2023-04-03 ENCOUNTER — Encounter: Payer: Self-pay | Admitting: Internal Medicine

## 2023-04-03 DIAGNOSIS — E876 Hypokalemia: Secondary | ICD-10-CM | POA: Diagnosis not present

## 2023-04-03 LAB — POTASSIUM: Potassium: 3.6 mEq/L (ref 3.5–5.1)

## 2023-04-04 MED ORDER — HYDROCODONE-ACETAMINOPHEN 5-325 MG PO TABS: 1.0000 | ORAL_TABLET | ORAL | 0 refills | Status: DC | PRN

## 2023-04-04 MED ORDER — DULOXETINE HCL 60 MG PO CPEP: 60.0000 mg | ORAL_CAPSULE | Freq: Every day | ORAL | 3 refills | Status: DC

## 2023-04-11 ENCOUNTER — Encounter: Payer: Self-pay | Admitting: Internal Medicine

## 2023-05-13 ENCOUNTER — Other Ambulatory Visit: Payer: Self-pay | Admitting: Internal Medicine

## 2023-05-14 NOTE — Telephone Encounter (Signed)
Please schedule overdue F/U appointment for 90 day refills. Thank you! 

## 2023-05-17 ENCOUNTER — Ambulatory Visit
Admission: RE | Admit: 2023-05-17 | Discharge: 2023-05-17 | Disposition: A | Discharge status: Home / Self Care | Payer: 59 | Source: Ambulatory Visit | Attending: Internal Medicine | Admitting: Internal Medicine

## 2023-05-17 ENCOUNTER — Other Ambulatory Visit: Payer: Self-pay | Admitting: Internal Medicine

## 2023-05-17 DIAGNOSIS — Z1231 Encounter for screening mammogram for malignant neoplasm of breast: Secondary | ICD-10-CM | POA: Diagnosis present

## 2023-05-17 MED ORDER — HYDROCODONE-ACETAMINOPHEN 5-325 MG PO TABS: 1.0000 | ORAL_TABLET | ORAL | 0 refills | Status: DC | PRN

## 2023-05-17 NOTE — Telephone Encounter (Signed)
Last written 04-04-23 #60 Last [id number] Next OV 09-13-23 [address]  No UDS or CSA

## 2023-05-23 ENCOUNTER — Other Ambulatory Visit: Payer: Self-pay | Admitting: Nutrition

## 2023-05-23 ENCOUNTER — Telehealth: Payer: Self-pay | Admitting: Nutrition

## 2023-05-23 NOTE — Telephone Encounter (Signed)
Called patient to set up appointment for Omni[employer] 5 training, because [employer] sent me a message that she received a new pump.  Pt. Says she has a new starter kit, but has been using the pump for quite some time.  Told her that it must be an upgraded one and that I can transfer the data from her old PDM to this new one, whenever she has time to stop by here.  To call first.

## 2023-05-28 ENCOUNTER — Ambulatory Visit: Payer: 59 | Admitting: Medical

## 2023-05-28 ENCOUNTER — Other Ambulatory Visit: Payer: Self-pay

## 2023-05-28 MED ORDER — VALACYCLOVIR HCL 1 G PO TABS: ORAL_TABLET | ORAL | 0 refills | Status: DC

## 2023-05-28 NOTE — Progress Notes (Deleted)
Cardiology Office Note:    Date:  05/28/2023   ID:  Shannon [name], DOB 1956/02/02, MRN [id number]  PCP:  Karie Schwalbe, MD  [facility] Cardiologist:  Yvonne Kendall, MD  [facility] Electrophysiologist:  None   Referring MD: Karie Schwalbe, MD   Chief Complaint: ***  History of Present Illness:    Shannon [name] is a 67 y.o. female with a hx of hypertension, hyperlipidemia, diabetes, mitral valve prolapse, and GERD who presents for follow-up.  Patient had a cath in 2018 for nonexertional chest pain.  This showed no significant CAD.  Echo in 2022 showed LVEF of 50 to 55%, no wall motion abnormalities, grade 1 diastolic dysfunction, mild MR, no mitral valve prolapse.  Patient was last seen in May 2023 and is overall feeling fairly well.  Crestor was increased.  Today, Recheck lipid panel and liver function  Past Medical History:  Diagnosis Date   Actinic keratosis    Allergy    Arthritis    hip, back, shoulders, joints   Asthma    Complication of anesthesia    problems with block during breast augmentation and epidural during childbirth   Depression    Diabetes mellitus 09/2011   type 2   Family history of adverse reaction to anesthesia    PROBLEMS WITH BLOCKS   Fatty liver    GERD (gastroesophageal reflux disease)    Glaucoma suspect    Headache    migraines-4x a week headache-1x week migraine   History of hiatal hernia    Hyperlipidemia    Hypertension    Controlled on meds   Mitral valve prolapse    Nose colonized with MRSA 05/15/2018   Osteopenia    Pneumonia    PONV (postoperative nausea and vomiting)    Psoriasis    Shingles 11/2006   Sleep disturbance    Tachycardia    Vertigo    Hx of    Past Surgical History:  Procedure Laterality Date   ABDOMINAL HYSTERECTOMY  2000   /BSO - abn. [facility]   APPENDECTOMY  1978   AUGMENTATION MAMMAPLASTY Bilateral 1981   BLADDER REPAIR     x2   BREAST ENHANCEMENT SURGERY  1987   CARDIAC  CATHETERIZATION     [facility]   CARDIAC CATHETERIZATION Left 12/18/2016   Procedure: Left Heart Cath and Coronary Angiography;  Surgeon: Iran Ouch, MD;  Location: [facility] INVASIVE CV LAB;  Service: Cardiovascular;  Laterality: Left;   CARPAL TUNNEL RELEASE Bilateral    CERVICAL DISCECTOMY  11/2009   with synthetic discs inserted--  Dr Lovell Sheehan   COLONOSCOPY     JOINT REPLACEMENT     Left Hip Replacement Left 02/01/2023   NM MYOVIEW LTD  03/2005   EF 56%, no ischemia   RECTAL PROLAPSE REPAIR     x1   SHOULDER ARTHROSCOPY WITH OPEN ROTATOR CUFF REPAIR Left 12/29/2019   Procedure: LEFT SHOULDER ARTHROSCOPIC DISTAL CLAVICLE EXCISISON, SUBACROMIAL DECOMPRESSION,BICEPS TENOTOMY,ROTATOR CUFF REPAIR ;  Surgeon: Lyndle Herrlich, MD;  Location: [facility] ORS;  Service: Orthopedics;  Laterality: Left;   SHOULDER CLOSED REDUCTION Left 05/03/2020   Procedure: Left shoulder arthroscopic lysis of adhesions and manipulation under anesthesia;  Surgeon: Lyndle Herrlich, MD;  Location: [facility] ORS;  Service: Orthopedics;  Laterality: Left;   SHOULDER SURGERY  09/2008, 01/2009   Right shoulder surgery, then redone with lysis of adhesions   TOTAL HIP ARTHROPLASTY Right 05/29/2018   Procedure: TOTAL HIP ARTHROPLASTY ANTERIOR APPROACH;  Surgeon:  Lyndle Herrlich, MD;  Location: [facility] ORS;  Service: Orthopedics;  Laterality: Right;   TOTAL HIP ARTHROPLASTY Left 01/2023   UPPER GI ENDOSCOPY      Current Medications: No outpatient medications have been marked as taking for the 05/28/23 encounter (Appointment) with Fransico Michael,  H, PA-C.     Allergies:   Humalog [insulin lispro], Lisinopril, Nitrofurantoin, Oxycodone-acetaminophen, Percocet [oxycodone-acetaminophen], Atorvastatin, Clarithromycin, Glipizide, and Invokana [canagliflozin]   Social History   Socioeconomic History   Marital status: Married    Spouse name: Not on file   Number of children: 5   Years of education: Not on file   Highest education level: Not on  file  Occupational History   Occupation: homemaker  Tobacco Use   Smoking status: Never    Passive exposure: Past   Smokeless tobacco: Never  Vaping Use   Vaping Use: Never used  Substance and Sexual Activity   Alcohol use: No   Drug use: No   Sexual activity: Not on file  Other Topics Concern   Not on file  Social History Narrative   Home maker   Social Determinants of Health   Financial Resource Strain: Not on file  Food Insecurity: Not on file  Transportation Needs: Not on file  Physical Activity: Not on file  Stress: Not on file  Social Connections: Not on file     Family History: The patient's ***family history includes Breast cancer in her maternal grandmother; Cancer in her father; Coronary artery disease in her father and sister; Depression in her sister; Diabetes in her maternal grandfather, maternal grandmother, mother, and sister; Heart attack in her cousin, paternal uncle, and paternal uncle; Heart disease in her father, mother, and sister; Hypertension in her mother; Melanoma in her mother; Ovarian cancer in her mother; Pulmonary Hypertension in her sister; Rheum arthritis in her maternal aunt. There is no history of Colon cancer.  ROS:   Please see the history of present illness.    *** All other systems reviewed and are negative.  EKGs/Labs/Other Studies Reviewed:    The following studies were reviewed today: ***  EKG:  EKG is *** ordered today.  The ekg ordered today demonstrates ***  Recent Labs: 10/05/2022: TSH 0.57 03/12/2023: ALT 31; BUN 25; Creatinine, Ser 0.99; Hemoglobin 13.9; Platelets 364.0; Sodium 139 04/03/2023: Potassium 3.6  Recent Lipid Panel    Component Value Date/Time   CHOL 187 03/12/2023 1456   CHOL 231 (H) 12/14/2016 1131   TRIG 229.0 (H) 03/12/2023 1456   HDL 50.60 03/12/2023 1456   HDL 59 12/14/2016 1131   CHOLHDL 4 03/12/2023 1456   VLDL 45.8 (H) 03/12/2023 1456   LDLCALC 79 06/27/2022 1057   LDLCALC 126 (H) 12/14/2016 1131    LDLDIRECT 116.0 03/12/2023 1456     Risk Assessment/Calculations:   {Does this patient have ATRIAL FIBRILLATION?:[phone]}   Physical Exam:    VS:  There were no vitals taken for this visit.    Wt Readings from Last 3 Encounters:  03/12/23 172 lb (78 kg)  03/06/23 173 lb 6.4 oz (78.7 kg)  11/23/22 177 lb (80.3 kg)     GEN: *** Well nourished, well developed in no acute distress HEENT: Normal NECK: No JVD; No carotid bruits LYMPHATICS: No lymphadenopathy CARDIAC: ***RRR, no murmurs, rubs, gallops RESPIRATORY:  Clear to auscultation without rales, wheezing or rhonchi  ABDOMEN: Soft, non-tender, non-distended MUSCULOSKELETAL:  No edema; No deformity  SKIN: Warm and dry NEUROLOGIC:  Alert and oriented x 3 PSYCHIATRIC:  Normal affect   ASSESSMENT:    No diagnosis found. PLAN:    In order of problems listed above:  ***  Disposition: Follow up {follow up:15908} with ***   Shared Decision Making/Informed Consent   {Are you ordering a CV Procedure (e.g. stress test, cath, DCCV, TEE, etc)?   Press F2        :[id number]}    Signed,  David Stall, PA-C  05/28/2023 7:50 AM    [facility]

## 2023-05-29 ENCOUNTER — Other Ambulatory Visit: Payer: Self-pay | Admitting: Internal Medicine

## 2023-06-01 ENCOUNTER — Other Ambulatory Visit: Payer: Self-pay | Admitting: Internal Medicine

## 2023-06-07 ENCOUNTER — Other Ambulatory Visit: Payer: Self-pay | Admitting: Internal Medicine

## 2023-06-07 DIAGNOSIS — E1142 Type 2 diabetes mellitus with diabetic polyneuropathy: Secondary | ICD-10-CM

## 2023-06-13 ENCOUNTER — Other Ambulatory Visit: Payer: Self-pay | Admitting: Internal Medicine

## 2023-06-14 NOTE — Telephone Encounter (Signed)
Please schedule office visit for further refills. Thank you! 

## 2023-06-22 ENCOUNTER — Telehealth: Payer: Self-pay | Admitting: Internal Medicine

## 2023-06-22 NOTE — Telephone Encounter (Signed)
Patient came to drop off paperwork for the provider to fill out and would like a call when it is ready for pickup.  Labeled and placed in provider's box

## 2023-06-22 NOTE — Telephone Encounter (Signed)
Form filled out and placed in Dr Karle Starch inbox on his desk.

## 2023-06-25 NOTE — Telephone Encounter (Signed)
Spoke to pt. She said she will come get the forms and send them in. Forms up front in yellow folder.

## 2023-07-01 ENCOUNTER — Encounter: Payer: Self-pay | Admitting: Emergency Medicine

## 2023-07-01 ENCOUNTER — Emergency Department
Admission: EM | Admit: 2023-07-01 | Discharge: 2023-07-01 | Disposition: A | Discharge status: Home / Self Care | Payer: 59 | Attending: Emergency Medicine | Admitting: Emergency Medicine

## 2023-07-01 ENCOUNTER — Emergency Department: Payer: 59

## 2023-07-01 ENCOUNTER — Other Ambulatory Visit: Payer: Self-pay

## 2023-07-01 DIAGNOSIS — J45909 Unspecified asthma, uncomplicated: Secondary | ICD-10-CM | POA: Insufficient documentation

## 2023-07-01 DIAGNOSIS — I10 Essential (primary) hypertension: Secondary | ICD-10-CM | POA: Diagnosis not present

## 2023-07-01 DIAGNOSIS — J069 Acute upper respiratory infection, unspecified: Secondary | ICD-10-CM | POA: Diagnosis not present

## 2023-07-01 DIAGNOSIS — J029 Acute pharyngitis, unspecified: Secondary | ICD-10-CM | POA: Diagnosis present

## 2023-07-01 DIAGNOSIS — K209 Esophagitis, unspecified without bleeding: Secondary | ICD-10-CM | POA: Diagnosis not present

## 2023-07-01 DIAGNOSIS — Z20822 Contact with and (suspected) exposure to covid-19: Secondary | ICD-10-CM | POA: Diagnosis not present

## 2023-07-01 DIAGNOSIS — E119 Type 2 diabetes mellitus without complications: Secondary | ICD-10-CM | POA: Diagnosis not present

## 2023-07-01 LAB — SARS CORONAVIRUS 2 BY RT PCR: SARS Coronavirus 2 by RT PCR: NEGATIVE

## 2023-07-01 LAB — GROUP A STREP BY PCR: Group A Strep by PCR: NOT DETECTED

## 2023-07-01 MED ORDER — HYDROCOD POLI-CHLORPHE POLI ER 10-8 MG/5ML PO SUER: 5.0000 mL | Freq: Two times a day (BID) | ORAL | 0 refills | Status: DC | PRN

## 2023-07-01 MED ORDER — MAGIC MOUTHWASH: ORAL | 0 refills | Status: DC

## 2023-07-01 MED ORDER — ALUM & MAG HYDROXIDE-SIMETH 200-200-20 MG/5ML PO SUSP
30.0000 mL | Freq: Once | ORAL | Status: AC
  Administered 2023-07-01: 30 mL via ORAL
  Filled 2023-07-01: qty 30

## 2023-07-01 MED ORDER — LIDOCAINE VISCOUS HCL 2 % MT SOLN
15.0000 mL | Freq: Once | OROMUCOSAL | Status: AC
  Administered 2023-07-01: 15 mL via ORAL
  Filled 2023-07-01: qty 15

## 2023-07-01 NOTE — Discharge Instructions (Signed)
Continue taking your regular medication as prescribed by your doctor.  A prescription for cough medication was sent to the pharmacy which does contain a narcotic.  Do not drive or operate machinery while taking this medication.  Also a prescription was written for the medication that she took in the emergency department.  This is to be taken before meals and before bedtime.  I also call and make an appointment with your gastroenterologist to have your routine colonoscopy and endoscopy performed which may also give more information about your throat pain.

## 2023-07-01 NOTE — ED Triage Notes (Signed)
Patient to ED via POV for sore throat x3 days. States hurts worse when swallowing. Speaking in full sentences at this time. NAD noted.

## 2023-07-01 NOTE — ED Provider Notes (Signed)
[facility] Provider Note    Event Date/Time   First MD Initiated Contact with Patient 07/01/23 1044     (approximate)   History   Sore Throat   HPI  Shannon [name] is a 67 y.o. female   presents to the ED with complaint of coughing for the last month.  Patient states that this is caused her throat to feel raw and a "burning sensation" for the last 3 days.  Patient denies any fever or chills and reports that initially when she began coughing without it was productive.  She is unaware of any known COVID exposure.  Patient denies any shortness of breath, chest pain, diaphoresis or difficulty breathing.  Coughing is generally worse at night than during the day.  She is taking Tessalon Perles without any relief of her cough.  Patient has a history of hypertension, mitral valve prolapse, GERD, asthma, diabetes type 2, gastroenteritis and colitis.      Physical Exam   Triage Vital Signs: ED Triage Vitals  Encounter Vitals Group     BP 07/01/23 1024 131/66     Systolic BP Percentile --      Diastolic BP Percentile --      Pulse Rate 07/01/23 1024 98     Resp 07/01/23 1024 18     Temp 07/01/23 1024 98.4 F (36.9 C)     Temp Source 07/01/23 1024 Oral     SpO2 07/01/23 1024 96 %     Weight 07/01/23 1022 168 lb (76.2 kg)     Height 07/01/23 1022 5\' 4"  (1.626 m)     Head Circumference --      Peak Flow --      Pain Score 07/01/23 1021 6     Pain Loc --      Pain Education --      Exclude from Growth Chart --     Most recent vital signs: Vitals:   07/01/23 1024  BP: 131/66  Pulse: 98  Resp: 18  Temp: 98.4 F (36.9 C)  SpO2: 96%     General: Awake, no distress.  CV:  Good peripheral perfusion.  Heart regular rate and rhythm. Resp:  Normal effort.  Lungs are clear bilaterally. Abd:  No distention.  Soft, with minimal epigastric tenderness.  Bowel sounds normoactive x 4 quadrants. Other:     ED Results / Procedures / Treatments   Labs (all  labs ordered are listed, but only abnormal results are displayed) Labs Reviewed  GROUP A STREP BY PCR  SARS CORONAVIRUS 2 BY RT PCR     RADIOLOGY Chest x-ray images reviewed by myself independent of the radiologist and no infiltrate or masses noted.  Official radiology report is no active cardiopulmonary disease.    PROCEDURES:  Critical Care performed:   Procedures   MEDICATIONS ORDERED IN ED: Medications  alum & mag hydroxide-simeth (MAALOX/MYLANTA) 200-200-20 MG/5ML suspension 30 mL (30 mLs Oral Given 07/01/23 1249)    And  lidocaine (XYLOCAINE) 2 % viscous mouth solution 15 mL (15 mLs Oral Given 07/01/23 1249)     IMPRESSION / MDM / ASSESSMENT AND PLAN / ED COURSE  I reviewed the triage vital signs and the nursing notes.   Differential diagnosis includes, but is not limited to, viral URI with cough, COVID, pneumonia, bronchitis, esophagitis, exacerbation of reflux disease secondary to coughing.  67 year old female presents to the ED with complaint of coughing for 1 month which is worse at night.  She  is currently taking Tessalon Perles without any relief of her cough.  She has a history of hiatal hernia and reflux disease.  She states that initially the cough was productive and now is a dry cough.  COVID and strep were ordered in triage and reported as negative to the lab.  Chest x-ray was negative for acute cardiopulmonary disease.  Patient was given a trial of viscous lidocaine and Maalox which gave her temporary relief.  We discussed following up with her gastroenterologist which she states she is behind in getting both her endoscopy and colonoscopy.  She is strongly urged to call this week to make an appointment.  A prescription for Tussionex was sent to the pharmacy for her to begin taking.  She is aware that this is a narcotic and cannot be taken while driving or operating machinery.  She will discontinue taking the Tessalon at night.  A prescription for the Maalox and  viscous lidocaine was given to her.  She is to continue taking her Protonix and Carafate as prescribed by her doctor.      Patient's presentation is most consistent with acute illness / injury with system symptoms.  FINAL CLINICAL IMPRESSION(S) / ED DIAGNOSES   Final diagnoses:  Esophagitis  Viral URI with cough     Rx / DC Orders   ED Discharge Orders          Ordered    chlorpheniramine-HYDROcodone (TUSSIONEX) 10-8 MG/5ML  Every 12 hours PRN        07/01/23 1319    magic mouthwash SOLN       Note to Pharmacy: Alum mag hydroxide 60 ml and viscous lidocaine 30 mL   07/01/23 1319             Note:  This document was prepared using Dragon voice recognition software and may include unintentional dictation errors.   Tommi Rumps, PA-C 07/01/23 1409    Minna Antis, MD 07/01/23 1507

## 2023-07-06 ENCOUNTER — Ambulatory Visit: Payer: 59 | Admitting: Internal Medicine

## 2023-07-06 ENCOUNTER — Encounter: Payer: Self-pay | Admitting: Internal Medicine

## 2023-07-06 ENCOUNTER — Telehealth: Payer: Self-pay

## 2023-07-06 VITALS — BP 124/80 | HR 92 | Ht 64.0 in | Wt 170.6 lb

## 2023-07-06 DIAGNOSIS — Z794 Long term (current) use of insulin: Secondary | ICD-10-CM

## 2023-07-06 DIAGNOSIS — Z7984 Long term (current) use of oral hypoglycemic drugs: Secondary | ICD-10-CM

## 2023-07-06 DIAGNOSIS — E782 Mixed hyperlipidemia: Secondary | ICD-10-CM | POA: Diagnosis not present

## 2023-07-06 DIAGNOSIS — E119 Type 2 diabetes mellitus without complications: Secondary | ICD-10-CM

## 2023-07-06 DIAGNOSIS — E1165 Type 2 diabetes mellitus with hyperglycemia: Secondary | ICD-10-CM | POA: Diagnosis not present

## 2023-07-06 DIAGNOSIS — E1142 Type 2 diabetes mellitus with diabetic polyneuropathy: Secondary | ICD-10-CM

## 2023-07-06 MED ORDER — ONETOUCH VERIO W/DEVICE KIT: PACK | 1 refills | Status: AC

## 2023-07-06 MED ORDER — ONETOUCH ULTRA VI STRP: ORAL_STRIP | 11 refills | Status: AC

## 2023-07-06 MED ORDER — ONETOUCH DELICA LANCETS 33G MISC: 11 refills | Status: AC

## 2023-07-06 NOTE — Progress Notes (Signed)
Patient ID: Shannon [name], female   DOB: 11-18-1956, 67 y.o.   MRN: [id number]   HPI: Shannon [name] is a 67 y.o.-year-old female, initially referred by her PCP, Dr. Alphonsus Sias, returning for follow-up for DM2, dx in 2012, insulin-dependent since ~2019, uncontrolled, with complications (peripheral neuropathy). Last visit 4 months ago.    Interim history: She has occasional nausea, no increased urination, nausea. Before last visit, she and her daughter started cooking better foods -sugars improved. She had a cough x 1 mo (Covid negative), then developed odynophagia >> will have an EGD/colonoscopy soon. She is not eating well and sugars improved due to this.  Reviewed history: Patient described that her sugars worsened after she had to stop Metformin due to GI symptoms and was changed to sulfonylurea. They increased even further after steroid injection for Achilles tendinitis.  Her sugars were initially in the 300-400 range, but then increased to 500-HI (>600) >> she presented to the emergency room (11/12/2020).  Afterwards, she started to work on her diet.  Insulin pump: -OmniPod Dash >> Omnipod 5  Insulin: -She has allergy to Humalog/Lyumjev >> significant irritation and inflammation at the infusion site -Switched to NovoLog samples since NovoLog was not covered by her insurance despite 2 preauthorization requests - U500 now  CGM: -Dexcom G6  Supplies: -Mail-order pharmacy for Dexcom  Reviewed HbA1c levels: Lab Results  Component Value Date   HGBA1C 6.9 (A) 03/06/2023   HGBA1C 7.3 (A) 11/23/2022   HGBA1C 8.5 (A) 07/20/2022   HGBA1C 8.5 (A) 03/16/2022   HGBA1C 8.2 (A) 11/15/2021   HGBA1C 8.0 (A) 07/22/2021   HGBA1C 10.4 (A) 02/15/2021   HGBA1C 9.0 (A) 11/16/2020   HGBA1C 7.8 (A) 07/13/2020   HGBA1C 8.1 (H) 12/22/2019   HGBA1C 7.3 (A) 07/04/2019   HGBA1C 8.9 (H) 01/22/2019   HGBA1C 8.8 (H) 10/15/2018   HGBA1C 7.9 (H) 02/13/2018   HGBA1C 7.0 (H) 09/19/2017   HGBA1C 8.6 (H)  04/13/2017   HGBA1C 8.2 (H) 11/16/2016   HGBA1C 7.4 (H) 05/17/2016   HGBA1C 7.8 (H) 11/19/2015   HGBA1C 7.2 (H) 04/23/2015   HGBA1C 8.4 (H) 01/14/2015   HGBA1C 8.6 (H) 09/22/2014   HGBA1C 8.0 (H) 03/31/2014   HGBA1C 6.9 (H) 09/23/2013   HGBA1C 7.0 (H) 03/17/2013   HGBA1C 6.5 09/16/2012   HGBA1C 6.6 (H) 02/20/2012   HGBA1C 9.0 (H) 10/19/2011   Previously on: - Glimepiride 2 >> 4 mg before breakfast   - Onglyza 5 mg before breakfast - Lantus 30-35 units at bedtime She is intolerant to Metformin >> N/V/D >> had to stop and changed to Glimepiride.  Previously on: - Ozempic 0.5 mg weekly >> stopped 2/2 nausea - Lantus 26 >> 34 >> Toujeo 46 units at bedtime - Lyumjev 6-10 >> 12-18 >> 15 >> 20 units before each meal  Now on: - Pump settings with U500 - changes suggested at last OV are shown in bold - basal rates: 12 am: 1.0  3 am: 0.9  6 am: 0.7  9 am: 1 9 PM: 1.1  - ICR:  (try to start the bolus 45-60 min before the meal)  12 AM: 1: 14 >> 1:11  4 PM: actually 1:11 8 PM: 1: 9 >> 1:8 - target: 120-120  - ISF: 1:60 - Active insulin time: 6 hours  Also, continue: - Jardiance 10 mg before b'fast -restarted 11/2022 -  -started 11/2022 >> stopped 2/2 severe constipation  TDD from basal insulin: 61% >> 67-78 %>> 66% >> 63% >>  62% TDD from bolus insulin: 39% >> 22-33% >> 33% >> 38% >> 20% Total daily dose: 170-180 units/day - extended bolusing: not using - changes infusion site: q1-2 days  She checks her sugars more than 4 times a day with her  Dexcom CGM:  Prev.:  Previously:   Lowest sugar was LO >> ... 55 >> 47 >> 40; she has hypoglycemia awareness at 110.  Highest sugar was HI ... >> 300s >> 400s >> 300s.  Glucometer: One Touch Verio  Pt's meals are: - Breakfast: usually skips, when eats b'fast: sausage bisquit - Lunch: salad or sandwich - Dinner: meat and 2 veggies - Snacks: 1-2: apples, oranges, occasionally chips  -+ CKD, last BUN/creatinine:  Lab Results   Component Value Date   BUN 25 (H) 03/12/2023   BUN 22 10/05/2022   CREATININE 0.99 03/12/2023   CREATININE 0.85 10/05/2022  She is not on ACE inhibitor/ARB. She had cough with lisinopril.  -+ HL; last set of lipids: Lab Results  Component Value Date   CHOL 187 03/12/2023   HDL 50.60 03/12/2023   LDLCALC 79 06/27/2022   LDLDIRECT 116.0 03/12/2023   TRIG 229.0 (H) 03/12/2023   CHOLHDL 4 03/12/2023  On Crestor 5 mg  daily.  - last eye exam was in 2024: No DR reportedly. She has glaucoma - exams q6 mo. Her blurry vision improved (had laser surgery for glaucoma 05/2021).   - She has numbness and tingling in her toes. She also has muscle cramps in her feet.  Last foot exam 03/12/2023.  Pt has FH of DM in mother, son, daughter, sister, MGM, MGF, M aunt and uncles.  She also has a history of HTN, fatty liver, GERD. She has psoriatic arthritis and ankylosing spondylitis. She has significant joint pain - hands, back, hips.  She is on a biologic infusion (Cosentyx). She presented to the ED for nausea on 12/24/2019.  Per review of the labs, she appeared dehydrated, but no distinct pathology was found.  Of note, lipase was normal. She started Lopressor for HTN - but had dizziness >> stopped.  TSH was normal: Lab Results  Component Value Date   TSH 0.57 10/05/2022   ROS: + see HPI  I reviewed pt's medications, allergies, PMH, social hx, family hx, and changes were documented in the history of present illness. Otherwise, unchanged from my initial visit note.  Past Medical History:  Diagnosis Date   Actinic keratosis    Allergy    Arthritis    hip, back, shoulders, joints   Asthma    Complication of anesthesia    problems with block during breast augmentation and epidural during childbirth   Depression    Diabetes mellitus 09/2011   type 2   Family history of adverse reaction to anesthesia    PROBLEMS WITH BLOCKS   Fatty liver    GERD (gastroesophageal reflux disease)     Glaucoma suspect    Headache    migraines-4x a week headache-1x week migraine   History of hiatal hernia    Hyperlipidemia    Hypertension    Controlled on meds   Mitral valve prolapse    Nose colonized with MRSA 05/15/2018   Osteopenia    Pneumonia    PONV (postoperative nausea and vomiting)    Psoriasis    Shingles 11/2006   Sleep disturbance    Tachycardia    Vertigo    Hx of   Past Surgical History:  Procedure Laterality Date   ABDOMINAL  HYSTERECTOMY  2000   /BSO - abn. Paps   APPENDECTOMY  1978   AUGMENTATION MAMMAPLASTY Bilateral 1981   BLADDER REPAIR     x2   BREAST ENHANCEMENT SURGERY  1987   CARDIAC CATHETERIZATION     [facility]   CARDIAC CATHETERIZATION Left 12/18/2016   Procedure: Left Heart Cath and Coronary Angiography;  Surgeon: Iran Ouch, MD;  Location: [facility];  Service: Cardiovascular;  Laterality: Left;   CARPAL TUNNEL RELEASE Bilateral    CERVICAL DISCECTOMY  11/2009   with synthetic discs inserted--  Dr Lovell Sheehan   COLONOSCOPY     JOINT REPLACEMENT     Left Hip Replacement Left 02/01/2023   NM MYOVIEW LTD  03/2005   EF 56%, no ischemia   RECTAL PROLAPSE REPAIR     x1   SHOULDER ARTHROSCOPY WITH OPEN ROTATOR CUFF REPAIR Left 12/29/2019   Procedure: LEFT SHOULDER ARTHROSCOPIC DISTAL CLAVICLE EXCISISON, SUBACROMIAL DECOMPRESSION,BICEPS TENOTOMY,ROTATOR CUFF REPAIR ;  Surgeon: Lyndle Herrlich, MD;  Location: [facility] ORS;  Service: Orthopedics;  Laterality: Left;   SHOULDER CLOSED REDUCTION Left 05/03/2020   Procedure: Left shoulder arthroscopic lysis of adhesions and manipulation under anesthesia;  Surgeon: Lyndle Herrlich, MD;  Location: [facility] OR[facility];  Service: Orthopedics;  Laterality: Left;   SHOULDER SURGERY  09/2008, 01/2009   Right shoulder surgery, then redone with lysis of adhesions   TOTAL HIP ARTHROPLASTY Right 05/29/2018   Procedure: TOTAL HIP ARTHROPLASTY ANTERIOR APPROACH;  Surgeon: Lyndle Herrlich, MD;  Location: [facility] OR[facility];  Service:  Orthopedics;  Laterality: Right;   TOTAL HIP ARTHROPLASTY Left 01/2023   UPPER GI ENDOSCOPY     Social History   Socioeconomic History   Marital status: Married    Spouse name: Not on file   Number of children: 5   Years of education: Not on file   Highest education level: Not on file  Occupational History   Occupation: homemaker  Tobacco Use   Smoking status: Never    Passive exposure: Past   Smokeless tobacco: Never  Vaping Use   Vaping status: Never Used  Substance and Sexual Activity   Alcohol use: No   Drug use: No   Sexual activity: Not on file  Other Topics Concern   Not on file  Social History Narrative   Home maker   Social Determinants of Health   Financial Resource Strain: Not on file  Food Insecurity: Not on file  Transportation Needs: Not on file  Physical Activity: Not on file  Stress: Not on file  Social Connections: Not on file  Intimate Partner Violence: Not on file   Current Outpatient Medications on File Prior to Visit  Medication Sig Dispense Refill   potassium chloride SA (KLOR-CON M) 20 MEQ tablet Take 1 tablet (20 mEq total) by mouth daily. 30 tablet 11   aspirin-acetaminophen-caffeine (EXCEDRIN MIGRAINE) 250-250-65 MG tablet Take 1 tablet by mouth every 6 (six) hours as needed for headache.     azelastine (ASTELIN) 0.1 % nasal spray Place 1 spray into both nostrils 2 (two) times daily. Use in each nostril as directed 30 mL 12   Blood Glucose Monitoring Suppl (FIFTY50 GLUCOSE METER 2.0) w/Device KIT OneTouch Ultra2 Meter kit     cetirizine (ZYRTEC) 10 MG tablet Take 10 mg by mouth daily.      chlorpheniramine-HYDROcodone (TUSSIONEX) 10-8 MG/5ML Take 5 mLs by mouth every 12 (twelve) hours as needed for cough. 120 mL 0   clobetasol (TEMOVATE) 0.05 % external  solution Patient to mix in jar of CeraVe Cream and apply to back twice daily until itch improved. Avoid face, groin, axilla. 50 mL 1   Continuous Blood Gluc Transmit (DEXCOM G6 TRANSMITTER)  MISC 1 Device by Does not apply route every 3 (three) months. 1 each 1   Continuous Glucose Sensor (DEXCOM G6 SENSOR) MISC CHANGE 1 SENSOR EVERY 10 DAYS AS DIRECTED 9 each 3   docusate sodium (COLACE) 100 MG capsule Take 300 mg by mouth at bedtime.      DULoxetine (CYMBALTA) 60 MG capsule Take 1 capsule (60 mg total) by mouth daily. 90 capsule 3   empagliflozin (JARDIANCE) 10 MG TABS tablet Take 1 tablet (10 mg total) by mouth daily before breakfast. 90 tablet 3   Fluocinolone Acetonide 0.01 % OIL Apply 1-2 gtts to ears once to twice daily as needed until improved. 20 mL 2   fluticasone (FLONASE) 50 MCG/ACT nasal spray Place 1 spray into both nostrils daily.     furosemide (LASIX) 20 MG tablet Take 1 tablet by mouth once daily 30 tablet 5   gabapentin (NEURONTIN) 100 MG capsule Take 100 mg by mouth 2 (two) times daily.     Glucagon 3 MG/DOSE POWD Place 3 mg into the nose once as needed for up to 1 dose. 1 each 11   HYDROcodone-acetaminophen (NORCO/VICODIN) 5-325 MG tablet Take 1 tablet by mouth every 4 (four) hours as needed for moderate pain. 60 tablet 0   Insulin Disposable Pump (OMNIPOD 5 G6 INTRO, GEN 5,) KIT USE AS DIRECTED 1 kit 0   Insulin Disposable Pump (OMNIPOD 5 G6 POD, GEN 5,) MISC 1 each by Does not apply route every other day. 15 each 11   insulin regular human CONCENTRATED (HUMULIN R) 500 UNIT/ML injection INJECT SUBCUTANEOUSLY UP TO 200  UNITS IN THE INSULIN PUMP DAILY 60 mL 1   LUMIGAN 0.01 % SOLN Place 1 drop into both eyes at bedtime.      magic mouthwash SOLN 15 ml ac and hs 90 mL 0   methocarbamol (ROBAXIN) 500 MG tablet Take 500 mg by mouth every 8 (eight) hours as needed.     metoprolol succinate (TOPROL-XL) 25 MG 24 hr tablet TAKE 1 AND 1/2 TABLETS BY MOUTH  DAILY 135 tablet 3   ondansetron (ZOFRAN-ODT) 4 MG disintegrating tablet DISSOLVE 1 TABLET ON THE TONGUE  EVERY 6 HOURS AS NEEDED FOR  NAUSEA AND VOMITING 180 tablet 0   ONETOUCH DELICA LANCETS FINE MISC Check blood  sugar once daily and as needed. 250.00 100 each 1   ONETOUCH ULTRA test strip USE TO CHECK BLOOD SUGAR 2  TIMES DAILY 200 strip 3   pantoprazole (PROTONIX) 40 MG tablet TAKE 1 TABLET BY MOUTH TWICE  DAILY ON AN EMPTY STOMACH 180 tablet 3   rosuvastatin (CRESTOR) 5 MG tablet TAKE 1 TABLET BY MOUTH DAILY 30 tablet 0   sucralfate (CARAFATE) 1 g tablet Take 1 tablet (1 g total) by mouth in the morning, at noon, and at bedtime. 270 tablet 3   TALTZ 80 MG/ML SOAJ Inject into the skin.     traZODone (DESYREL) 100 MG tablet TAKE 2 TABLETS BY MOUTH EVERY  NIGHT AT BEDTIME 180 tablet 3   triamterene-hydrochlorothiazide (MAXZIDE-25) 37.5-25 MG tablet TAKE 1 TABLET BY MOUTH ONCE  DAILY 90 tablet 3   valACYclovir (VALTREX) 1000 MG tablet TAKE 2 TABLETS BY MOUTH AT ONSET OF SYMPTOMS AS NEEDED FOR FEVER BLISTERS AND TAKE 2 TABLETS 12 HOURS LATER.  90 tablet 0   No current facility-administered medications on file prior to visit.   Allergies  Allergen Reactions   Humalog [Insulin Lispro] Swelling   Lisinopril Cough   Nitrofurantoin Other (See Comments)    Pt unsure   Oxycodone-Acetaminophen Itching   Percocet [Oxycodone-Acetaminophen]    Atorvastatin Diarrhea    May have caused colitis--but not clear cut   Clarithromycin Palpitations   Glipizide Other (See Comments)    Sugars too variable   Invokana [Canagliflozin]     Nausea, constipation   Family History  Problem Relation Age of Onset   Diabetes Mother    Heart disease Mother    Hypertension Mother    Ovarian cancer Mother    Melanoma Mother    Cancer Father    Coronary artery disease Father    Heart disease Father    Diabetes Sister    Depression Sister    Coronary artery disease Sister    Heart disease Sister    Pulmonary Hypertension Sister    Rheum arthritis Maternal Aunt    Heart attack Paternal Uncle    Heart attack Paternal Uncle    Breast cancer Maternal Grandmother    Diabetes Maternal Grandmother    Diabetes Maternal  Grandfather    Heart attack Cousin    Colon cancer Neg Hx    PE: BP 124/80   Pulse 92   Ht 5\' 4"  (1.626 m)   Wt 170 lb 9.6 oz (77.4 kg)   SpO2 93%   BMI 29.28 kg/m  Wt Readings from Last 3 Encounters:  07/06/23 170 lb 9.6 oz (77.4 kg)  07/01/23 168 lb (76.2 kg)  03/12/23 172 lb (78 kg)   Constitutional: normal weight, in NAD Eyes: EOMI, no exophthalmos ENT: no thyromegaly, no cervical lymphadenopathy Cardiovascular: RRR, No MRG Respiratory: CTA B Musculoskeletal: no deformities Skin: no rashes Neurological: no tremor with outstretched hands  ASSESSMENT: 1. DM2, insulin-dependent, uncontrolled, without long-term complications, but with significant hyperglycemia  2. HL  PLAN:  1. Patient with poorly controlled type 2 diabetes, insulin resistant, previously on a basal/bolus insulin regimen with very high blood sugars, improved on the U500 insulin regimen.  We tried to Ozempic in the past but had to stop due to constant nausea.  We tried Rybelsus but she developed severe constipation and had to stop.  We were then able to start Jardiance.  At last visit, sugars were higher after breakfast and more significantly after lunch and dinner, with a hyperglycemic peak between 12 AM and 1 AM.  She was trying to inject U-500 insulin approximately 30 minutes before the meal but she was not able to do this before dinner.  She was only injecting it approximately 10 minutes before the meal.  I advised her to try to bolus 45-60 minutes in advance.  I also recommended to strengthen her insulin to carb ratio for breakfast and a snack at night but also discussed about possibly stopping the snack.  HbA1c at last visit was the best she had in 10 years, at 6.9%. CGM interpretation: -At today's visit, we reviewed her CGM downloads: It appears that 70% of values are in target range (goal >70%), while 29% are higher than 180 (goal <25%), and 1% are lower than 70 (goal <4%).  The calculated average blood sugar  is 159.  The projected HbA1c for the next 3 months (GMI) is 7.1%. -Reviewing the CGM trends, sugars have further improved and are mostly fluctuating within the target range but need  to increase above target after lunch and after dinner.  She does enter carbs into the pump for these meals so we discussed about strengthening her insulin to carb ratios for these.  She does have a much lower amount of insulin obtained from boluses compared to basal confirming the need for more insulin for her meals. -I suggested: Patient Instructions  Please use the following pump settings: - basal rates: 12 am: 1.0  3 am: 0.9  6 am: 0.7  9 am: 1 9 PM: 1.1  - ICR:  (try to start the bolus 45-60 min before the meal)  12 AM: 1:11  12 pm: 1:11 >> 1:10  4 PM: 1:11 >> 1:10 8 PM: 1:8 - target: 120-120  - ISF: 1:60 - Active insulin time: 6 hours  Also, continue: - Jardiance 10 mg before b'fast.  Please return in 3-4 months.  - we checked her HbA1c: 6.8% (lower) - advised to check sugars at different times of the day - 4x a day, rotating check times - advised for yearly eye exams >> she is UTD - return to clinic in 3-4 months  2. HL -Reviewed latest lipid panel from 4 months ago: LDL above target, higher, improved triglycerides elevated, otherwise fractions at goal: Lab Results  Component Value Date   CHOL 187 03/12/2023   HDL 50.60 03/12/2023   LDLCALC 79 06/27/2022   LDLDIRECT 116.0 03/12/2023   TRIG 229.0 (H) 03/12/2023   CHOLHDL 4 03/12/2023  -He is on Crestor 5 mg daily without side effects  Carlus Pavlov, MD PhD [facility] Endocrinology

## 2023-07-06 NOTE — Telephone Encounter (Signed)
Pt was concern regarding her medication that was changed today at her ov , since her A1c 6.2 . Pt wanted if any thing needed to be  changed. Please advise

## 2023-07-06 NOTE — Patient Instructions (Addendum)
Please use the following pump settings: - basal rates: 12 am: 1.0  3 am: 0.9  6 am: 0.7  9 am: 1 9 PM: 1.1  - ICR:  (try to start the bolus 45-60 min before the meal)  12 AM: 1:11  12 pm: 1:11 >> 1:10  4 PM: 1:11 >> 1:10 8 PM: 1:8 - target: 120-120  - ISF: 1:60 - Active insulin time: 6 hours  Also, continue: - Jardiance 10 mg before b'fast.  Please return in 3-4 months.

## 2023-07-06 NOTE — Telephone Encounter (Signed)
Yes

## 2023-07-06 NOTE — Telephone Encounter (Signed)
Dorinda Hill, Can you please explain?  Do you refer to the the patient - [name]?  If so, the HbA1c appears to be stable, but I do not feel this is very accurate for her and going by the blood sugars, we still need slightly better control.

## 2023-07-10 ENCOUNTER — Other Ambulatory Visit: Payer: Self-pay | Admitting: Internal Medicine

## 2023-07-11 ENCOUNTER — Other Ambulatory Visit: Payer: Self-pay | Admitting: Internal Medicine

## 2023-07-12 ENCOUNTER — Ambulatory Visit: Payer: 59 | Admitting: Cardiology

## 2023-07-12 NOTE — Telephone Encounter (Signed)
last visit with Dr. Okey Dupre on 03/29/22 with plan to f/u 1 year. Please schedule f/u appt.  Thanks

## 2023-07-13 ENCOUNTER — Ambulatory Visit: Payer: 59 | Admitting: Internal Medicine

## 2023-07-13 NOTE — Telephone Encounter (Signed)
Left voice mail to schedule appt

## 2023-07-17 ENCOUNTER — Encounter: Payer: Self-pay | Admitting: Internal Medicine

## 2023-07-17 MED ORDER — HYDROCODONE-ACETAMINOPHEN 5-325 MG PO TABS: 1.0000 | ORAL_TABLET | ORAL | 0 refills | Status: DC | PRN

## 2023-07-17 MED ORDER — FLUCONAZOLE 150 MG PO TABS: 150.0000 mg | ORAL_TABLET | ORAL | 1 refills | Status: DC

## 2023-07-18 ENCOUNTER — Ambulatory Visit: Payer: 59

## 2023-07-18 DIAGNOSIS — R09A2 Foreign body sensation, throat: Secondary | ICD-10-CM | POA: Diagnosis not present

## 2023-07-18 DIAGNOSIS — K2289 Other specified disease of esophagus: Secondary | ICD-10-CM | POA: Diagnosis not present

## 2023-07-18 DIAGNOSIS — R1310 Dysphagia, unspecified: Secondary | ICD-10-CM | POA: Diagnosis present

## 2023-07-18 DIAGNOSIS — K219 Gastro-esophageal reflux disease without esophagitis: Secondary | ICD-10-CM | POA: Diagnosis not present

## 2023-07-18 DIAGNOSIS — K449 Diaphragmatic hernia without obstruction or gangrene: Secondary | ICD-10-CM | POA: Diagnosis not present

## 2023-07-23 ENCOUNTER — Other Ambulatory Visit: Payer: Self-pay | Admitting: Internal Medicine

## 2023-07-23 DIAGNOSIS — E1165 Type 2 diabetes mellitus with hyperglycemia: Secondary | ICD-10-CM

## 2023-07-24 ENCOUNTER — Other Ambulatory Visit: Payer: Self-pay | Admitting: Internal Medicine

## 2023-07-26 ENCOUNTER — Ambulatory Visit (INDEPENDENT_AMBULATORY_CARE_PROVIDER_SITE_OTHER): Payer: 59 | Admitting: Internal Medicine

## 2023-07-26 ENCOUNTER — Encounter: Payer: Self-pay | Admitting: Internal Medicine

## 2023-07-26 VITALS — BP 110/60 | HR 93 | Temp 98.4°F | Ht 64.0 in | Wt 169.0 lb

## 2023-07-26 DIAGNOSIS — R5383 Other fatigue: Secondary | ICD-10-CM

## 2023-07-26 DIAGNOSIS — R109 Unspecified abdominal pain: Secondary | ICD-10-CM | POA: Insufficient documentation

## 2023-07-26 DIAGNOSIS — Z23 Encounter for immunization: Secondary | ICD-10-CM

## 2023-07-26 DIAGNOSIS — B3781 Candidal esophagitis: Secondary | ICD-10-CM | POA: Insufficient documentation

## 2023-07-26 MED ORDER — GABAPENTIN 100 MG PO CAPS: 100.0000 mg | ORAL_CAPSULE | Freq: Four times a day (QID) | ORAL | 3 refills | Status: DC

## 2023-07-26 MED ORDER — RABEPRAZOLE SODIUM 20 MG PO TBEC: 20.0000 mg | DELAYED_RELEASE_TABLET | Freq: Every day | ORAL | 3 refills | Status: DC

## 2023-07-26 NOTE — Assessment & Plan Note (Signed)
Seems radicular--but her past shingles was at the same level but on the right side Doesn't seem to be related to esophagus Not related to eating---and on the left side---so doubt gallbladder related Will have her increase the gabapentin-- to 100mg  twice a day and 2 at bedtime

## 2023-07-26 NOTE — Assessment & Plan Note (Signed)
Finishing out fluconazole Changed to rabeprozole---will refill

## 2023-07-26 NOTE — Assessment & Plan Note (Signed)
Ongoing issues--but no clear answer

## 2023-07-26 NOTE — Progress Notes (Signed)
Subjective:    Patient ID: Shannon [name], female    DOB: January 13, 1956, 67 y.o.   MRN: [id number]  HPI Here due to upper abdominal/flank pain  Some LUQ abdominal pain and around her flank and back Starts at epigastric area Had occurred sporadically over the past 8 days--and getting worse Especially bad in the morning  Raising left arm makes it worse Worse if lies down ---and when getting up Occasionally feels it with a big breath   No relationship to eating No N/V  No recent lifting or injuries  Had cough for 7 weeks GI thought it could be related to esophageal thrush  Still gets mucus she coughs up Overall--the cough is less severe  Current Outpatient Medications on File Prior to Visit  Medication Sig Dispense Refill   aspirin-acetaminophen-caffeine (EXCEDRIN MIGRAINE) 250-250-65 MG tablet Take 1 tablet by mouth every 6 (six) hours as needed for headache.     azelastine (ASTELIN) 0.1 % nasal spray Place 1 spray into both nostrils 2 (two) times daily. Use in each nostril as directed 30 mL 12   Blood Glucose Monitoring Suppl (FIFTY50 GLUCOSE METER 2.0) w/Device KIT OneTouch Ultra2 Meter kit     Blood Glucose Monitoring Suppl (ONETOUCH VERIO) w/Device KIT Use as advised 1 kit 1   cetirizine (ZYRTEC) 10 MG tablet Take 10 mg by mouth daily.      clobetasol (TEMOVATE) 0.05 % external solution Patient to mix in jar of CeraVe Cream and apply to back twice daily until itch improved. Avoid face, groin, axilla. 50 mL 1   Continuous Glucose Sensor (DEXCOM G6 SENSOR) MISC CHANGE 1 SENSOR EVERY 10 DAYS AS DIRECTED 9 each 3   Continuous Glucose Transmitter (DEXCOM G6 TRANSMITTER) MISC USE 1 TRANSMITTER EVERY 3 MONTHS 1 each 3   docusate sodium (COLACE) 100 MG capsule Take 300 mg by mouth at bedtime.      DULoxetine (CYMBALTA) 60 MG capsule Take 1 capsule (60 mg total) by mouth daily. 90 capsule 3   empagliflozin (JARDIANCE) 10 MG TABS tablet Take 1 tablet (10 mg total) by mouth daily before  breakfast. 90 tablet 3   fluconazole (DIFLUCAN) 150 MG tablet Take 1 tablet (150 mg total) by mouth once a week. 2 tablet 1   Fluocinolone Acetonide 0.01 % OIL Apply 1-2 gtts to ears once to twice daily as needed until improved. 20 mL 2   fluticasone (FLONASE) 50 MCG/ACT nasal spray Place 1 spray into both nostrils daily.     furosemide (LASIX) 20 MG tablet Take 1 tablet by mouth once daily 30 tablet 5   gabapentin (NEURONTIN) 100 MG capsule Take 100 mg by mouth 2 (two) times daily.     Glucagon 3 MG/DOSE POWD Place 3 mg into the nose once as needed for up to 1 dose. 1 each 11   glucose blood (ONETOUCH ULTRA) test strip Use as instructed 2x a day 100 strip 11   HYDROcodone-acetaminophen (NORCO/VICODIN) 5-325 MG tablet Take 1 tablet by mouth every 4 (four) hours as needed for moderate pain. 60 tablet 0   Insulin Disposable Pump (OMNIPOD 5 G6 INTRO, GEN 5,) KIT USE AS DIRECTED 1 kit 0   Insulin Disposable Pump (OMNIPOD 5 G6 POD, GEN 5,) MISC 1 each by Does not apply route every other day. 15 each 11   insulin regular human CONCENTRATED (HUMULIN R) 500 UNIT/ML injection INJECT SUBCUTANEOUSLY UP TO 200  UNITS IN THE INSULIN PUMP DAILY 60 mL 1   LUMIGAN 0.01 %  SOLN Place 1 drop into both eyes at bedtime.      metoprolol succinate (TOPROL-XL) 25 MG 24 hr tablet TAKE 1 AND 1/2 TABLETS BY MOUTH  DAILY 135 tablet 3   ondansetron (ZOFRAN-ODT) 4 MG disintegrating tablet DISSOLVE 1 TABLET ON THE TONGUE  EVERY 6 HOURS AS NEEDED FOR  NAUSEA AND VOMITING 180 tablet 0   OneTouch Delica Lancets 33G MISC Use 2x a day 100 each 11   potassium chloride SA (KLOR-CON M) 20 MEQ tablet Take 1 tablet (20 mEq total) by mouth daily. 30 tablet 11   RABEprazole (ACIPHEX) 20 MG tablet Take 20 mg by mouth daily.     rosuvastatin (CRESTOR) 5 MG tablet Take 1 tablet (5 mg total) by mouth daily. PLEASE CALL OFFICE TO SCHEDULE APPOINTMENT PRIOR TO NEXT REFILL (first attempt) 30 tablet 0   TALTZ 80 MG/ML SOAJ Inject into the skin.      traZODone (DESYREL) 100 MG tablet TAKE 2 TABLETS BY MOUTH EVERY  NIGHT AT BEDTIME 180 tablet 3   triamterene-hydrochlorothiazide (MAXZIDE-25) 37.5-25 MG tablet TAKE 1 TABLET BY MOUTH ONCE  DAILY 90 tablet 3   valACYclovir (VALTREX) 1000 MG tablet TAKE 2 TABLETS BY MOUTH AT ONSET OF SYMPTOMS AS NEEDED FOR FEVER BLISTERS AND TAKE 2 TABLETS 12 HOURS LATER. 90 tablet 0   No current facility-administered medications on file prior to visit.    Allergies  Allergen Reactions   Humalog [Insulin Lispro] Swelling   Lisinopril Cough   Nitrofurantoin Other (See Comments)    Pt unsure   Oxycodone-Acetaminophen Itching   Percocet [Oxycodone-Acetaminophen]    Atorvastatin Diarrhea    May have caused colitis--but not clear cut   Clarithromycin Palpitations   Glipizide Other (See Comments)    Sugars too variable   Invokana [Canagliflozin]     Nausea, constipation    Past Medical History:  Diagnosis Date   Actinic keratosis    Allergy    Arthritis    hip, back, shoulders, joints   Asthma    Complication of anesthesia    problems with block during breast augmentation and epidural during childbirth   Depression    Diabetes mellitus 09/2011   type 2   Family history of adverse reaction to anesthesia    PROBLEMS WITH BLOCKS   Fatty liver    GERD (gastroesophageal reflux disease)    Glaucoma suspect    Headache    migraines-4x a week headache-1x week migraine   History of hiatal hernia    Hyperlipidemia    Hypertension    Controlled on meds   Mitral valve prolapse    Nose colonized with MRSA 05/15/2018   Osteopenia    Pneumonia    PONV (postoperative nausea and vomiting)    Psoriasis    Shingles 11/2006   Sleep disturbance    Tachycardia    Vertigo    Hx of    Past Surgical History:  Procedure Laterality Date   ABDOMINAL HYSTERECTOMY  2000   /BSO - abn. Paps   APPENDECTOMY  1978   AUGMENTATION MAMMAPLASTY Bilateral 1981   BLADDER REPAIR     x2   BREAST ENHANCEMENT  SURGERY  1987   CARDIAC CATHETERIZATION     [facility]   CARDIAC CATHETERIZATION Left 12/18/2016   Procedure: Left Heart Cath and Coronary Angiography;  Surgeon: Iran Ouch, MD;  Location: [facility];  Service: Cardiovascular;  Laterality: Left;   CARPAL TUNNEL RELEASE Bilateral    CERVICAL DISCECTOMY  11/2009  with synthetic discs inserted--  Dr Lovell Sheehan   COLONOSCOPY     JOINT REPLACEMENT     Left Hip Replacement Left 02/01/2023   NM MYOVIEW LTD  03/2005   EF 56%, no ischemia   RECTAL PROLAPSE REPAIR     x1   SHOULDER ARTHROSCOPY WITH OPEN ROTATOR CUFF REPAIR Left 12/29/2019   Procedure: LEFT SHOULDER ARTHROSCOPIC DISTAL CLAVICLE EXCISISON, SUBACROMIAL DECOMPRESSION,BICEPS TENOTOMY,ROTATOR CUFF REPAIR ;  Surgeon: Lyndle Herrlich, MD;  Location: [facility] OR[facility];  Service: Orthopedics;  Laterality: Left;   SHOULDER CLOSED REDUCTION Left 05/03/2020   Procedure: Left shoulder arthroscopic lysis of adhesions and manipulation under anesthesia;  Surgeon: Lyndle Herrlich, MD;  Location: [facility] OR[facility];  Service: Orthopedics;  Laterality: Left;   SHOULDER SURGERY  09/2008, 01/2009   Right shoulder surgery, then redone with lysis of adhesions   TOTAL HIP ARTHROPLASTY Right 05/29/2018   Procedure: TOTAL HIP ARTHROPLASTY ANTERIOR APPROACH;  Surgeon: Lyndle Herrlich, MD;  Location: [facility] OR[facility];  Service: Orthopedics;  Laterality: Right;   TOTAL HIP ARTHROPLASTY Left 01/2023   UPPER GI ENDOSCOPY      Family History  Problem Relation Age of Onset   Diabetes Mother    Heart disease Mother    Hypertension Mother    Ovarian cancer Mother    Melanoma Mother    Cancer Father    Coronary artery disease Father    Heart disease Father    Diabetes Sister    Depression Sister    Coronary artery disease Sister    Heart disease Sister    Pulmonary Hypertension Sister    Rheum arthritis Maternal Aunt    Heart attack Paternal Uncle    Heart attack Paternal Uncle    Breast cancer Maternal Grandmother     Diabetes Maternal Grandmother    Diabetes Maternal Grandfather    Heart attack Cousin    Colon cancer Neg Hx     Social History   Socioeconomic History   Marital status: Married    Spouse name: Not on file   Number of children: 5   Years of education: Not on file   Highest education level: Not on file  Occupational History   Occupation: homemaker  Tobacco Use   Smoking status: Never    Passive exposure: Past   Smokeless tobacco: Never  Vaping Use   Vaping status: Never Used  Substance and Sexual Activity   Alcohol use: No   Drug use: No   Sexual activity: Not on file  Other Topics Concern   Not on file  Social History Narrative   Home maker   Social Determinants of Health   Financial Resource Strain: Not on file  Food Insecurity: Not on file  Transportation Needs: Not on file  Physical Activity: Not on file  Stress: Not on file  Social Connections: Not on file  Intimate Partner Violence: Not on file   Review of Systems No fever No rash    Objective:   Physical Exam Constitutional:      Appearance: Normal appearance.  Cardiovascular:     Rate and Rhythm: Normal rate and regular rhythm.     Heart sounds: No murmur heard.    No gallop.  Pulmonary:     Effort: Pulmonary effort is normal.     Breath sounds: Normal breath sounds. No wheezing or rales.     Comments: Mild lower sternal tenderness (not the pain) Abdominal:     Palpations: Abdomen is soft.     Tenderness:  There is no abdominal tenderness. There is no guarding.  Musculoskeletal:     Cervical back: Neck supple.  Lymphadenopathy:     Cervical: No cervical adenopathy.  Skin:    Comments: No rash  Neurological:     Mental Status: She is alert.     Comments: No change in sensation along involved dermatome (~T12)            Assessment & Plan:

## 2023-07-27 ENCOUNTER — Other Ambulatory Visit: Payer: Self-pay | Admitting: *Deleted

## 2023-07-27 MED ORDER — GABAPENTIN 100 MG PO CAPS: ORAL_CAPSULE | ORAL | 3 refills | Status: DC

## 2023-07-27 NOTE — Telephone Encounter (Signed)
Received a fax from [facility], there were 2 sigs on the pt's gabapentin rx that Dr. Alphonsus Sias sent in. I sent in a new rx will the instructions for use that Dr. Alphonsus Sias stated at her last OV.

## 2023-08-03 ENCOUNTER — Encounter: Payer: Self-pay | Admitting: Internal Medicine

## 2023-08-08 ENCOUNTER — Ambulatory Visit (INDEPENDENT_AMBULATORY_CARE_PROVIDER_SITE_OTHER): Payer: 59 | Admitting: Internal Medicine

## 2023-08-08 ENCOUNTER — Encounter: Payer: Self-pay | Admitting: Internal Medicine

## 2023-08-08 VITALS — BP 118/74 | HR 88 | Temp 98.1°F | Ht 64.0 in | Wt 171.0 lb

## 2023-08-08 DIAGNOSIS — B3781 Candidal esophagitis: Secondary | ICD-10-CM

## 2023-08-08 DIAGNOSIS — R109 Unspecified abdominal pain: Secondary | ICD-10-CM | POA: Diagnosis not present

## 2023-08-08 DIAGNOSIS — G4489 Other headache syndrome: Secondary | ICD-10-CM | POA: Diagnosis not present

## 2023-08-08 DIAGNOSIS — Z7984 Long term (current) use of oral hypoglycemic drugs: Secondary | ICD-10-CM

## 2023-08-08 DIAGNOSIS — E1149 Type 2 diabetes mellitus with other diabetic neurological complication: Secondary | ICD-10-CM

## 2023-08-08 LAB — RENAL FUNCTION PANEL
Albumin: 4.3 g/dL (ref 3.5–5.2)
BUN: 14 mg/dL (ref 6–23)
CO2: 35 meq/L — ABNORMAL HIGH (ref 19–32)
Calcium: 9.8 mg/dL (ref 8.4–10.5)
Chloride: 99 meq/L (ref 96–112)
Creatinine, Ser: 0.99 mg/dL (ref 0.40–1.20)
GFR: 59.84 mL/min — ABNORMAL LOW (ref >60.00)
Glucose, Bld: 106 mg/dL — ABNORMAL HIGH (ref 70–99)
Phosphorus: 4 mg/dL (ref 2.3–4.6)
Potassium: 4.1 meq/L (ref 3.5–5.1)
Sodium: 141 meq/L (ref 135–145)

## 2023-08-08 LAB — CBC
HCT: 43 % (ref 36.0–46.0)
Hemoglobin: 13.8 g/dL (ref 12.0–15.0)
MCHC: 32 g/dL (ref 30.0–36.0)
MCV: 81.4 fl (ref 78.0–100.0)
Platelets: 285 10*3/uL (ref 150.0–400.0)
RBC: 5.28 Mil/uL — ABNORMAL HIGH (ref 3.87–5.11)
RDW: 19.2 % — ABNORMAL HIGH (ref 11.5–15.5)
WBC: 8 10*3/uL (ref 4.0–10.5)

## 2023-08-08 LAB — MICROALBUMIN / CREATININE URINE RATIO
Creatinine,U: 97.2 mg/dL
Microalb Creat Ratio: 0.9 mg/g (ref 0.0–30.0)
Microalb, Ur: 0.9 mg/dL (ref 0.0–1.9)

## 2023-08-08 LAB — LIPID PANEL
Cholesterol: 164 mg/dL (ref 0–200)
HDL: 54.4 mg/dL (ref >39.00)
LDL Cholesterol: 78 mg/dL (ref 0–99)
NonHDL: 109.83
Total CHOL/HDL Ratio: 3
Triglycerides: 159 mg/dL — ABNORMAL HIGH (ref 0.0–149.0)
VLDL: 31.8 mg/dL (ref 0.0–40.0)

## 2023-08-08 LAB — HEPATIC FUNCTION PANEL
ALT: 17 U/L (ref 0–35)
AST: 17 U/L (ref 0–37)
Albumin: 4.3 g/dL (ref 3.5–5.2)
Alkaline Phosphatase: 108 U/L (ref 39–117)
Bilirubin, Direct: 0.1 mg/dL (ref 0.0–0.3)
Total Bilirubin: 0.3 mg/dL (ref 0.2–1.2)
Total Protein: 7.2 g/dL (ref 6.0–8.3)

## 2023-08-08 LAB — TSH: TSH: 1.24 u[IU]/mL (ref 0.35–5.50)

## 2023-08-08 NOTE — Assessment & Plan Note (Signed)
Better but now with vaginal symptoms May have to stop taltz

## 2023-08-08 NOTE — Assessment & Plan Note (Signed)
Has been worsening and hard to characterize Will refer to headache specialist

## 2023-08-08 NOTE — Assessment & Plan Note (Signed)
Seems to have some relief with gabapentin Now on 100/100/200 Asked her to titrate up the gabapentin --try 200 bid or 300 bid during the day Due to tenderness--will check abdominal ultrasound

## 2023-08-08 NOTE — Assessment & Plan Note (Signed)
Doing better  Will check labs and has appt next month

## 2023-08-08 NOTE — Progress Notes (Signed)
Subjective:    Patient ID: Shannon [name], female    DOB: 05-14-1956, 67 y.o.   MRN: [id number]  HPI Here for follow up of flank pain  Some better Pain is not along the flank so much --just abdomen Gabapentin 100 bid and 200 at bedtime No sedation This goes back for a long time  Sleeping well  Doing well with taltz Had esophageal candida--and now having vaginal symptoms Rx for fluconazole Might have to come off the taltz Having some mouth peeling--but no white patches  Current Outpatient Medications on File Prior to Visit  Medication Sig Dispense Refill   aspirin-acetaminophen-caffeine (EXCEDRIN MIGRAINE) 250-250-65 MG tablet Take 1 tablet by mouth every 6 (six) hours as needed for headache.     azelastine (ASTELIN) 0.1 % nasal spray Place 1 spray into both nostrils 2 (two) times daily. Use in each nostril as directed 30 mL 12   Blood Glucose Monitoring Suppl (FIFTY50 GLUCOSE METER 2.0) w/Device KIT OneTouch Ultra2 Meter kit     Blood Glucose Monitoring Suppl (ONETOUCH VERIO) w/Device KIT Use as advised 1 kit 1   cetirizine (ZYRTEC) 10 MG tablet Take 10 mg by mouth daily.      clobetasol (TEMOVATE) 0.05 % external solution Patient to mix in jar of CeraVe Cream and apply to back twice daily until itch improved. Avoid face, groin, axilla. 50 mL 1   Continuous Glucose Sensor (DEXCOM G6 SENSOR) MISC CHANGE 1 SENSOR EVERY 10 DAYS AS DIRECTED 9 each 3   Continuous Glucose Transmitter (DEXCOM G6 TRANSMITTER) MISC USE 1 TRANSMITTER EVERY 3 MONTHS 1 each 3   docusate sodium (COLACE) 100 MG capsule Take 300 mg by mouth at bedtime.      DULoxetine (CYMBALTA) 60 MG capsule Take 1 capsule (60 mg total) by mouth daily. 90 capsule 3   empagliflozin (JARDIANCE) 10 MG TABS tablet Take 1 tablet (10 mg total) by mouth daily before breakfast. 90 tablet 3   fluconazole (DIFLUCAN) 150 MG tablet Take 1 tablet (150 mg total) by mouth once a week. 2 tablet 1   Fluocinolone Acetonide 0.01 % OIL Apply 1-2  gtts to ears once to twice daily as needed until improved. 20 mL 2   fluticasone (FLONASE) 50 MCG/ACT nasal spray Place 1 spray into both nostrils daily.     furosemide (LASIX) 20 MG tablet Take 1 tablet by mouth once daily 30 tablet 5   gabapentin (NEURONTIN) 100 MG capsule Take 100mg  twice a day and 2 at bedtime 360 capsule 3   Glucagon 3 MG/DOSE POWD Place 3 mg into the nose once as needed for up to 1 dose. 1 each 11   glucose blood (ONETOUCH ULTRA) test strip Use as instructed 2x a day 100 strip 11   HYDROcodone-acetaminophen (NORCO/VICODIN) 5-325 MG tablet Take 1 tablet by mouth every 4 (four) hours as needed for moderate pain. 60 tablet 0   Insulin Disposable Pump (OMNIPOD 5 G6 INTRO, GEN 5,) KIT USE AS DIRECTED 1 kit 0   Insulin Disposable Pump (OMNIPOD 5 G6 POD, GEN 5,) MISC 1 each by Does not apply route every other day. 15 each 11   insulin regular human CONCENTRATED (HUMULIN R) 500 UNIT/ML injection INJECT SUBCUTANEOUSLY UP TO 200  UNITS IN THE INSULIN PUMP DAILY 60 mL 1   LUMIGAN 0.01 % SOLN Place 1 drop into both eyes at bedtime.      metoprolol succinate (TOPROL-XL) 25 MG 24 hr tablet TAKE 1 AND 1/2 TABLETS BY MOUTH  DAILY 135 tablet 3   ondansetron (ZOFRAN-ODT) 4 MG disintegrating tablet DISSOLVE 1 TABLET ON THE TONGUE  EVERY 6 HOURS AS NEEDED FOR  NAUSEA AND VOMITING 180 tablet 0   OneTouch Delica Lancets 33G MISC Use 2x a day 100 each 11   potassium chloride SA (KLOR-CON M) 20 MEQ tablet Take 1 tablet (20 mEq total) by mouth daily. 30 tablet 11   RABEprazole (ACIPHEX) 20 MG tablet Take 1 tablet (20 mg total) by mouth daily. 90 tablet 3   rosuvastatin (CRESTOR) 5 MG tablet Take 1 tablet (5 mg total) by mouth daily. PLEASE CALL OFFICE TO SCHEDULE APPOINTMENT PRIOR TO NEXT REFILL (first attempt) 30 tablet 0   TALTZ 80 MG/ML SOAJ Inject into the skin.     traZODone (DESYREL) 100 MG tablet TAKE 2 TABLETS BY MOUTH EVERY  NIGHT AT BEDTIME 180 tablet 3   triamterene-hydrochlorothiazide  (MAXZIDE-25) 37.5-25 MG tablet TAKE 1 TABLET BY MOUTH ONCE  DAILY 90 tablet 3   valACYclovir (VALTREX) 1000 MG tablet TAKE 2 TABLETS BY MOUTH AT ONSET OF SYMPTOMS AS NEEDED FOR FEVER  BLISTERS AND TAKE 2 TABLETS BY  MOUTH 12 HOURS LATER 90 tablet 3   No current facility-administered medications on file prior to visit.    Allergies  Allergen Reactions   Humalog [Insulin Lispro] Swelling   Lisinopril Cough   Nitrofurantoin Other (See Comments)    Pt unsure   Oxycodone-Acetaminophen Itching   Percocet [Oxycodone-Acetaminophen]    Atorvastatin Diarrhea    May have caused colitis--but not clear cut   Clarithromycin Palpitations   Glipizide Other (See Comments)    Sugars too variable   Invokana [Canagliflozin]     Nausea, constipation    Past Medical History:  Diagnosis Date   Actinic keratosis    Allergy    Arthritis    hip, back, shoulders, joints   Asthma    Complication of anesthesia    problems with block during breast augmentation and epidural during childbirth   Depression    Diabetes mellitus 09/2011   type 2   Family history of adverse reaction to anesthesia    PROBLEMS WITH BLOCKS   Fatty liver    GERD (gastroesophageal reflux disease)    Glaucoma suspect    Headache    migraines-4x a week headache-1x week migraine   History of hiatal hernia    Hyperlipidemia    Hypertension    Controlled on meds   Mitral valve prolapse    Nose colonized with MRSA 05/15/2018   Osteopenia    Pneumonia    PONV (postoperative nausea and vomiting)    Psoriasis    Shingles 11/2006   Sleep disturbance    Tachycardia    Vertigo    Hx of    Past Surgical History:  Procedure Laterality Date   ABDOMINAL HYSTERECTOMY  2000   /BSO - abn. Paps   APPENDECTOMY  1978   AUGMENTATION MAMMAPLASTY Bilateral 1981   BLADDER REPAIR     x2   BREAST ENHANCEMENT SURGERY  1987   CARDIAC CATHETERIZATION     [facility]   CARDIAC CATHETERIZATION Left 12/18/2016   Procedure: Left Heart Cath and  Coronary Angiography;  Surgeon: Iran Ouch, MD;  Location: [facility];  Service: Cardiovascular;  Laterality: Left;   CARPAL TUNNEL RELEASE Bilateral    CERVICAL DISCECTOMY  11/2009   with synthetic discs inserted--  Dr Lovell Sheehan   COLONOSCOPY     JOINT REPLACEMENT  Left Hip Replacement Left 02/01/2023   NM MYOVIEW LTD  03/2005   EF 56%, no ischemia   RECTAL PROLAPSE REPAIR     x1   SHOULDER ARTHROSCOPY WITH OPEN ROTATOR CUFF REPAIR Left 12/29/2019   Procedure: LEFT SHOULDER ARTHROSCOPIC DISTAL CLAVICLE EXCISISON, SUBACROMIAL DECOMPRESSION,BICEPS TENOTOMY,ROTATOR CUFF REPAIR ;  Surgeon: Lyndle Herrlich, MD;  Location: [facility];  Service: Orthopedics;  Laterality: Left;   SHOULDER CLOSED REDUCTION Left 05/03/2020   Procedure: Left shoulder arthroscopic lysis of adhesions and manipulation under anesthesia;  Surgeon: Lyndle Herrlich, MD;  Location: [facility];  Service: Orthopedics;  Laterality: Left;   SHOULDER SURGERY  09/2008, 01/2009   Right shoulder surgery, then redone with lysis of adhesions   TOTAL HIP ARTHROPLASTY Right 05/29/2018   Procedure: TOTAL HIP ARTHROPLASTY ANTERIOR APPROACH;  Surgeon: Lyndle Herrlich, MD;  Location: [facility];  Service: Orthopedics;  Laterality: Right;   TOTAL HIP ARTHROPLASTY Left 01/2023   UPPER GI ENDOSCOPY      Family History  Problem Relation Age of Onset   Diabetes Mother    Heart disease Mother    Hypertension Mother    Ovarian cancer Mother    Melanoma Mother    Cancer Father    Coronary artery disease Father    Heart disease Father    Diabetes Sister    Depression Sister    Coronary artery disease Sister    Heart disease Sister    Pulmonary Hypertension Sister    Rheum arthritis Maternal Aunt    Heart attack Paternal Uncle    Heart attack Paternal Uncle    Breast cancer Maternal Grandmother    Diabetes Maternal Grandmother    Diabetes Maternal Grandfather    Heart attack Cousin    Colon cancer Neg Hx     Social  History   Socioeconomic History   Marital status: Married    Spouse name: Not on file   Number of children: 5   Years of education: Not on file   Highest education level: Not on file  Occupational History   Occupation: homemaker  Tobacco Use   Smoking status: Never    Passive exposure: Past   Smokeless tobacco: Never  Vaping Use   Vaping status: Never Used  Substance and Sexual Activity   Alcohol use: No   Drug use: No   Sexual activity: Not on file  Other Topics Concern   Not on file  Social History Narrative   Home maker   Social Determinants of Health   Financial Resource Strain: Not on file  Food Insecurity: Not on file  Transportation Needs: Not on file  Physical Activity: Not on file  Stress: Not on file  Social Connections: Not on file  Intimate Partner Violence: Not on file   Review of Systems Eating okay Sugars have still been good Still with headaches and back pain is worst. Now referred to physiatrist    Objective:   Physical Exam Constitutional:      Appearance: She is well-developed.  HENT:     Mouth/Throat:     Comments: No lesions Cardiovascular:     Rate and Rhythm: Normal rate and regular rhythm.     Heart sounds: No murmur heard.    No gallop.  Pulmonary:     Effort: Pulmonary effort is normal.     Breath sounds: Normal breath sounds. No wheezing or rales.  Abdominal:     Palpations: Abdomen is soft.     Comments: Mild LUQ  tenderness  No change in sensation in that area  Musculoskeletal:     Cervical back: Neck supple.  Lymphadenopathy:     Cervical: No cervical adenopathy.  Skin:    Comments: No flank rash  Neurological:     Mental Status: She is alert.            Assessment & Plan:

## 2023-08-10 ENCOUNTER — Encounter: Payer: Self-pay | Admitting: *Deleted

## 2023-08-15 ENCOUNTER — Other Ambulatory Visit: Payer: Self-pay | Admitting: Internal Medicine

## 2023-08-16 ENCOUNTER — Ambulatory Visit: Payer: 59 | Admitting: Internal Medicine

## 2023-08-16 ENCOUNTER — Encounter: Payer: Self-pay | Admitting: Internal Medicine

## 2023-08-16 VITALS — BP 120/70 | HR 92 | Temp 98.7°F | Ht 64.0 in | Wt 172.0 lb

## 2023-08-16 DIAGNOSIS — S3992XA Unspecified injury of lower back, initial encounter: Secondary | ICD-10-CM | POA: Insufficient documentation

## 2023-08-16 DIAGNOSIS — J0100 Acute maxillary sinusitis, unspecified: Secondary | ICD-10-CM | POA: Diagnosis not present

## 2023-08-16 MED ORDER — AMOXICILLIN-POT CLAVULANATE 875-125 MG PO TABS: 1.0000 | ORAL_TABLET | Freq: Two times a day (BID) | ORAL | 0 refills | Status: DC

## 2023-08-16 MED ORDER — TIZANIDINE HCL 2 MG PO TABS: 2.0000 mg | ORAL_TABLET | Freq: Three times a day (TID) | ORAL | 0 refills | Status: DC | PRN

## 2023-08-16 NOTE — Assessment & Plan Note (Signed)
Will treat with augmentin 875 bid x 7 days

## 2023-08-16 NOTE — Assessment & Plan Note (Addendum)
Seems to be all muscular--neck and mid back ?left hip contusion vs bursitis Continues on the hydrocodone Can try heat---ice on bursa Tizanidine 2mg  tid prn  Is going to the physiatrist for the chronic pain issues
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
## 2023-08-17 ENCOUNTER — Ambulatory Visit
Admission: RE | Admit: 2023-08-17 | Discharge: 2023-08-17 | Disposition: A | Discharge status: Home / Self Care | Payer: 59 | Source: Ambulatory Visit | Attending: Internal Medicine | Admitting: Internal Medicine

## 2023-08-17 DIAGNOSIS — R109 Unspecified abdominal pain: Secondary | ICD-10-CM | POA: Diagnosis present

## 2023-08-21 ENCOUNTER — Other Ambulatory Visit: Payer: Self-pay | Admitting: Internal Medicine

## 2023-08-22 ENCOUNTER — Other Ambulatory Visit: Payer: Self-pay | Admitting: Physical Medicine & Rehabilitation

## 2023-08-22 DIAGNOSIS — M5442 Lumbago with sciatica, left side: Secondary | ICD-10-CM

## 2023-08-22 DIAGNOSIS — G8929 Other chronic pain: Secondary | ICD-10-CM

## 2023-08-23 ENCOUNTER — Other Ambulatory Visit: Payer: Self-pay | Admitting: Physical Medicine & Rehabilitation

## 2023-08-23 DIAGNOSIS — M546 Pain in thoracic spine: Secondary | ICD-10-CM

## 2023-09-04 ENCOUNTER — Other Ambulatory Visit: Payer: Self-pay | Admitting: Internal Medicine

## 2023-09-06 ENCOUNTER — Encounter: Payer: Self-pay | Admitting: Internal Medicine

## 2023-09-06 ENCOUNTER — Ambulatory Visit: Payer: 59 | Attending: Internal Medicine | Admitting: Internal Medicine

## 2023-09-06 VITALS — BP 120/70 | HR 87 | Ht 64.0 in | Wt 173.1 lb

## 2023-09-06 DIAGNOSIS — E785 Hyperlipidemia, unspecified: Secondary | ICD-10-CM

## 2023-09-06 DIAGNOSIS — I10 Essential (primary) hypertension: Secondary | ICD-10-CM

## 2023-09-06 DIAGNOSIS — I700 Atherosclerosis of aorta: Secondary | ICD-10-CM | POA: Diagnosis not present

## 2023-09-06 DIAGNOSIS — R002 Palpitations: Secondary | ICD-10-CM | POA: Diagnosis not present

## 2023-09-06 DIAGNOSIS — E1169 Type 2 diabetes mellitus with other specified complication: Secondary | ICD-10-CM

## 2023-09-06 DIAGNOSIS — Z79899 Other long term (current) drug therapy: Secondary | ICD-10-CM

## 2023-09-06 MED ORDER — ROSUVASTATIN CALCIUM 10 MG PO TABS: 10.0000 mg | ORAL_TABLET | Freq: Every day | ORAL | 3 refills | Status: DC

## 2023-09-06 NOTE — Patient Instructions (Signed)
Medication Instructions:  Your physician recommends the following medication changes.  INCREASE: Rosuvastatin 10 mg by mouth daily  *If you need a refill on your cardiac medications before your next appointment, please call your pharmacy*   Lab Work: Your provider would like for you to return in January, 2025 to have the following labs drawn: (Lipid, ALT).   Please go to [facility] [address] ([facility] [address], [place] [address] You do not need an appointment.  They are open from 7:30 am-4 pm.  Lunch from 1:00 pm- 2:00 pm You will need to be fasting.    Testing/Procedures: No test ordered today    Follow-Up: At [facility], you and your health needs are our priority.  As part of our continuing mission to provide you with exceptional heart care, we have created designated Provider Care Teams.  These Care Teams include your primary Cardiologist (physician) and Advanced Practice Providers (APPs -  Physician Assistants and Nurse Practitioners) who all work together to provide you with the care you need, when you need it.  We recommend signing up for the patient portal called "MyChart".  Sign up information is provided on this After Visit Summary.  MyChart is used to connect with patients for Virtual Visits (Telemedicine).  Patients are able to view lab/test results, encounter notes, upcoming appointments, etc.  Non-urgent messages can be sent to your provider as well.   To learn more about what you can do with MyChart, go to [email or web address].    Your next appointment:   6 month(s)  Provider:   You may see Yvonne Kendall, MD or one of the following Advanced Practice Providers on your designated Care Team:   Nicolasa Ducking, NP Eula Listen, PA-C Cadence Fransico Michael, PA-C Charlsie Quest, NP

## 2023-09-06 NOTE — Progress Notes (Signed)
Cardiology Office Note:  .   Date:  09/07/2023  ID:  Shannon [name], DOB June 03, 1956, MRN [id number] PCP: Shannon Schwalbe, MD  [facility] Providers Cardiologist:  Shannon Kendall, MD     History of Present Illness: .   Discussed the use of AI scribe software for clinical note transcription with the patient, who gave verbal consent to proceed.  Shannon [name] is a 67 y.o. female with history of mitral valve prolapse, hypertension, hyperlipidemia, type 2 diabetes mellitus, and GERD, who presents for follow-up of hypertension and chest pain.  I last saw her in 03/2022 at which time she noted rare chest tightness when active.  She noted that this had been present for years and had decreased in frequency.  We agreed to increase rosuvastatin to 5 mg daily (she had been taking it 6 days a week due to prior GI intolerance with more aggressive dosing).  She has been tolerating this well.  Today, Shannon [name] reports experiencing occasional heart "flutters" that last less than a minute. She denies associated dizziness, shortness of breath, or syncope. She also reports occasional chest pain, which she believes may be stress-related due to recent familial losses and caregiving responsibilities. She expresses concern about the potential impact of long-term Furosemide use on her kidneys, but notes that cessation of the medication leads to edema and dyspnea.  She reports improved blood sugar control, with a recent A1C of 6.8, and is using a pump and transmitter system for diabetes management. However, she also reports weight gain, which she attributes to stress and lifestyle changes. She has tried Rybelsus for weight loss, but discontinued due to severe constipation.  The patient underwent a hip replacement approximately six to seven months ago and is gradually returning to normal activity levels. She reports feeling frequently tired, which she attributes to her heart rate, which is typically in the  high 80s or 90s at rest and increases with activity. She also notes a history of plaque in the aorta, identified on a previous CT scan.     ROS: See HPI  Studies Reviewed: Marland Kitchen   EKG Interpretation Date/Time:  Thursday September 06 2023 16:15:55 EDT Ventricular Rate:  87 PR Interval:  136 QRS Duration:  86 QT Interval:  376 QTC Calculation: 452 R Axis:   14  Text Interpretation: Normal sinus rhythm Normal ECG When compared with ECG of 29-Mar-2022 No significant change was found Confirmed by Shannon [name], Shannon [name] [phone]) on 09/07/2023 10:06:53 PM    Risk Assessment/Calculations:           Physical Exam:   VS:  BP 120/70 (BP Location: Left Arm, Patient Position: Sitting, Cuff Size: Normal)   Pulse 87   Ht 5\' 4"  (1.626 m)   Wt 173 lb 2 oz (78.5 kg)   SpO2 95%   BMI 29.72 kg/m    Wt Readings from Last 3 Encounters:  09/06/23 173 lb 2 oz (78.5 kg)  08/16/23 172 lb (78 kg)  08/08/23 171 lb (77.6 kg)    General:  NAD. Neck: No JVD or HJR. Lungs: Clear to auscultation bilaterally without wheezes or crackles. Heart: Regular rate and rhythm without murmurs, rubs, or gallops. Abdomen: Soft, nontender, nondistended. Extremities: No lower extremity edema.  ASSESSMENT AND PLAN: .    Palpitations: These have been brief and self-limited.  No further workup or intervention recommended.  Continue metoprolol succinate 37.5 mg daily.  Aortic atherosclerosis and hyperlipidemia associated with type 2 diabetes mellitus: Shannon [name] is tolerating  rosuvastatin 5 mg daily well.  We have agreed to increase this to 10 mg daily to target an LDL less than 70.  We will repeat a lipid panel and ALT in about 3 months.  Ongoing management of DM per Dr. Elvera [name].  Hypertension: Blood pressure well-controlled today.  No medication changes.    Dispo: Return to clinic in 6 months.  Signed, Shannon Kendall, MD

## 2023-09-07 ENCOUNTER — Encounter: Payer: Self-pay | Admitting: Internal Medicine

## 2023-09-07 ENCOUNTER — Ambulatory Visit
Admission: RE | Admit: 2023-09-07 | Discharge: 2023-09-07 | Disposition: A | Discharge status: Home / Self Care | Payer: 59 | Source: Ambulatory Visit | Attending: Physical Medicine & Rehabilitation | Admitting: Physical Medicine & Rehabilitation

## 2023-09-07 DIAGNOSIS — M546 Pain in thoracic spine: Secondary | ICD-10-CM

## 2023-09-07 DIAGNOSIS — G8929 Other chronic pain: Secondary | ICD-10-CM

## 2023-09-07 DIAGNOSIS — R002 Palpitations: Secondary | ICD-10-CM | POA: Insufficient documentation

## 2023-09-07 DIAGNOSIS — M5442 Lumbago with sciatica, left side: Secondary | ICD-10-CM

## 2023-09-11 ENCOUNTER — Ambulatory Visit: Payer: 59 | Admitting: Internal Medicine

## 2023-09-11 ENCOUNTER — Telehealth: Payer: Self-pay | Admitting: Internal Medicine

## 2023-09-11 ENCOUNTER — Encounter: Payer: Self-pay | Admitting: Internal Medicine

## 2023-09-11 VITALS — BP 120/70 | HR 86 | Temp 98.6°F | Ht 64.0 in | Wt 176.0 lb

## 2023-09-11 DIAGNOSIS — L4050 Arthropathic psoriasis, unspecified: Secondary | ICD-10-CM

## 2023-09-11 DIAGNOSIS — S3992XD Unspecified injury of lower back, subsequent encounter: Secondary | ICD-10-CM

## 2023-09-11 DIAGNOSIS — F39 Unspecified mood [affective] disorder: Secondary | ICD-10-CM | POA: Diagnosis not present

## 2023-09-11 DIAGNOSIS — J0100 Acute maxillary sinusitis, unspecified: Secondary | ICD-10-CM | POA: Diagnosis not present

## 2023-09-11 MED ORDER — TIZANIDINE HCL 2 MG PO TABS: 2.0000 mg | ORAL_TABLET | Freq: Three times a day (TID) | ORAL | 1 refills | Status: DC | PRN

## 2023-09-11 MED ORDER — ALPRAZOLAM 0.25 MG PO TABS: 0.2500 mg | ORAL_TABLET | Freq: Two times a day (BID) | ORAL | 0 refills | Status: DC | PRN

## 2023-09-11 NOTE — Telephone Encounter (Signed)
Patient was suppose to have Augmentin sent in to the pharmacy for her sinus infection.However,she went to the pharmacy and it hasn't ben sent in.  [facility] [place], [place] - [address] Phone: [phone]  Fax: [phone]

## 2023-09-11 NOTE — Assessment & Plan Note (Signed)
Will refill the tizanidine

## 2023-09-11 NOTE — Progress Notes (Signed)
Subjective:    Patient ID: Shannon [name], female    DOB: Apr 23, 1956, 67 y.o.   MRN: [id number]  HPI Here with several concerns  Having ongoing back and hip pain Still awaiting MRI done by Dr Mariah Milling Tizanidine did help some  Having right maxillary pain again--and headache Green nasal discharge Started several days ago No fever Some post nasal drip--making sore throat Coughs at night mostly No SOB  2 recent anxiety/panic attacks Thinks she needs something for her nerves "Everything is piling up again" Trouble sleeping also  Current Outpatient Medications on File Prior to Visit  Medication Sig Dispense Refill   aspirin-acetaminophen-caffeine (EXCEDRIN MIGRAINE) 250-250-65 MG tablet Take 1 tablet by mouth every 6 (six) hours as needed for headache.     azelastine (ASTELIN) 0.1 % nasal spray Place 1 spray into both nostrils 2 (two) times daily. Use in each nostril as directed 30 mL 12   Blood Glucose Monitoring Suppl (FIFTY50 GLUCOSE METER 2.0) w/Device KIT      Blood Glucose Monitoring Suppl (ONETOUCH VERIO) w/Device KIT Use as advised 1 kit 1   cetirizine (ZYRTEC) 10 MG tablet Take 10 mg by mouth daily.      clobetasol (TEMOVATE) 0.05 % external solution Patient to mix in jar of CeraVe Cream and apply to back twice daily until itch improved. Avoid face, groin, axilla. 50 mL 1   Continuous Glucose Sensor (DEXCOM G6 SENSOR) MISC CHANGE 1 SENSOR EVERY 10 DAYS AS DIRECTED 9 each 3   Continuous Glucose Transmitter (DEXCOM G6 TRANSMITTER) MISC USE 1 TRANSMITTER EVERY 3 MONTHS 1 each 3   docusate sodium (COLACE) 100 MG capsule Take 300 mg by mouth at bedtime.      DULoxetine (CYMBALTA) 60 MG capsule Take 1 capsule (60 mg total) by mouth daily. 90 capsule 3   empagliflozin (JARDIANCE) 10 MG TABS tablet Take 1 tablet (10 mg total) by mouth daily before breakfast. 90 tablet 3   fluconazole (DIFLUCAN) 150 MG tablet Take 1 tablet (150 mg total) by mouth once a week. 2 tablet 1    Fluocinolone Acetonide 0.01 % OIL Apply 1-2 gtts to ears once to twice daily as needed until improved. 20 mL 2   fluticasone (FLONASE) 50 MCG/ACT nasal spray Place 1 spray into both nostrils daily.     furosemide (LASIX) 20 MG tablet TAKE 1 TABLET BY MOUTH DAILY 90 tablet 3   gabapentin (NEURONTIN) 100 MG capsule Take 100mg  twice a day and 2 at bedtime 360 capsule 3   Glucagon 3 MG/DOSE POWD Place 3 mg into the nose once as needed for up to 1 dose. 1 each 11   glucose blood (ONETOUCH ULTRA) test strip Use as instructed 2x a day 100 strip 11   HYDROcodone-acetaminophen (NORCO/VICODIN) 5-325 MG tablet Take 1 tablet by mouth every 4 (four) hours as needed for moderate pain. 60 tablet 0   Insulin Disposable Pump (OMNIPOD 5 G6 INTRO, GEN 5,) KIT USE AS DIRECTED 1 kit 0   Insulin Disposable Pump (OMNIPOD 5 G6 POD, GEN 5,) MISC 1 each by Does not apply route every other day. 15 each 11   insulin regular human CONCENTRATED (HUMULIN R) 500 UNIT/ML injection INJECT SUBCUTANEOUSLY UP TO 200  UNITS IN THE INSULIN PUMP DAILY 60 mL 1   LINZESS 72 MCG capsule Take 72 mcg by mouth daily.     LUMIGAN 0.01 % SOLN Place 1 drop into both eyes at bedtime.      metoprolol succinate (TOPROL-XL)  25 MG 24 hr tablet TAKE 1 AND 1/2 TABLETS BY MOUTH  DAILY 135 tablet 3   mometasone (ELOCON) 0.1 % cream Apply 1 Application topically daily.     ondansetron (ZOFRAN-ODT) 4 MG disintegrating tablet DISSOLVE 1 TABLET ON THE TONGUE  EVERY 6 HOURS AS NEEDED FOR  NAUSEA AND VOMITING 180 tablet 0   OneTouch Delica Lancets 33G MISC Use 2x a day 100 each 11   potassium chloride SA (KLOR-CON M) 20 MEQ tablet Take 1 tablet (20 mEq total) by mouth daily. 30 tablet 11   RABEprazole (ACIPHEX) 20 MG tablet Take 1 tablet (20 mg total) by mouth daily. 90 tablet 3   rosuvastatin (CRESTOR) 10 MG tablet Take 1 tablet (10 mg total) by mouth daily. (Patient taking differently: Take 20 mg by mouth daily.) 90 tablet 3   TALTZ 80 MG/ML SOAJ Inject into  the skin.     tiZANidine (ZANAFLEX) 2 MG tablet Take 1 tablet (2 mg total) by mouth 3 (three) times daily as needed for muscle spasms. 30 tablet 0   traZODone (DESYREL) 100 MG tablet TAKE 2 TABLETS BY MOUTH EVERY  NIGHT AT BEDTIME 180 tablet 3   triamterene-hydrochlorothiazide (MAXZIDE-25) 37.5-25 MG tablet TAKE 1 TABLET BY MOUTH ONCE  DAILY 90 tablet 3   valACYclovir (VALTREX) 1000 MG tablet TAKE 2 TABLETS BY MOUTH AT ONSET OF SYMPTOMS AS NEEDED FOR FEVER  BLISTERS AND TAKE 2 TABLETS BY  MOUTH 12 HOURS LATER 90 tablet 3   No current facility-administered medications on file prior to visit.    Allergies  Allergen Reactions   Humalog [Insulin Lispro] Swelling   Lisinopril Cough   Nitrofurantoin Other (See Comments)    Pt unsure   Oxycodone-Acetaminophen Itching   Percocet [Oxycodone-Acetaminophen]    Atorvastatin Diarrhea    May have caused colitis--but not clear cut   Clarithromycin Palpitations   Glipizide Other (See Comments)    Sugars too variable   Invokana [Canagliflozin]     Nausea, constipation    Past Medical History:  Diagnosis Date   Actinic keratosis    Allergy    Arthritis    hip, back, shoulders, joints   Asthma    Complication of anesthesia    problems with block during breast augmentation and epidural during childbirth   Depression    Diabetes mellitus 09/2011   type 2   Family history of adverse reaction to anesthesia    PROBLEMS WITH BLOCKS   Fatty liver    GERD (gastroesophageal reflux disease)    Glaucoma suspect    Headache    migraines-4x a week headache-1x week migraine   History of hiatal hernia    Hyperlipidemia    Hypertension    Controlled on meds   Mitral valve prolapse    Nose colonized with MRSA 05/15/2018   Osteopenia    Pneumonia    PONV (postoperative nausea and vomiting)    Psoriasis    Shingles 11/2006   Sleep disturbance    Tachycardia    Vertigo    Hx of    Past Surgical History:  Procedure Laterality Date   ABDOMINAL  HYSTERECTOMY  2000   /BSO - abn. Paps   APPENDECTOMY  1978   AUGMENTATION MAMMAPLASTY Bilateral 1981   BLADDER REPAIR     x2   BREAST ENHANCEMENT SURGERY  1987   CARDIAC CATHETERIZATION     [facility]   CARDIAC CATHETERIZATION Left 12/18/2016   Procedure: Left Heart Cath and Coronary Angiography;  Surgeon:  Iran Ouch, MD;  Location: [facility] INVASIVE CV [facility];  Service: Cardiovascular;  Laterality: Left;   CARPAL TUNNEL RELEASE Bilateral    CERVICAL DISCECTOMY  11/2009   with synthetic discs inserted--  Dr Lovell Sheehan   COLONOSCOPY     JOINT REPLACEMENT     Left Hip Replacement Left 02/01/2023   NM MYOVIEW LTD  03/2005   EF 56%, no ischemia   RECTAL PROLAPSE REPAIR     x1   SHOULDER ARTHROSCOPY WITH OPEN ROTATOR CUFF REPAIR Left 12/29/2019   Procedure: LEFT SHOULDER ARTHROSCOPIC DISTAL CLAVICLE EXCISISON, SUBACROMIAL DECOMPRESSION,BICEPS TENOTOMY,ROTATOR CUFF REPAIR ;  Surgeon: Lyndle Herrlich, MD;  Location: [facility] ORS;  Service: Orthopedics;  Laterality: Left;   SHOULDER CLOSED REDUCTION Left 05/03/2020   Procedure: Left shoulder arthroscopic lysis of adhesions and manipulation under anesthesia;  Surgeon: Lyndle Herrlich, MD;  Location: [facility] ORS;  Service: Orthopedics;  Laterality: Left;   SHOULDER SURGERY  09/2008, 01/2009   Right shoulder surgery, then redone with lysis of adhesions   TOTAL HIP ARTHROPLASTY Right 05/29/2018   Procedure: TOTAL HIP ARTHROPLASTY ANTERIOR APPROACH;  Surgeon: Lyndle Herrlich, MD;  Location: [facility] OR[facility];  Service: Orthopedics;  Laterality: Right;   TOTAL HIP ARTHROPLASTY Left 01/2023   UPPER GI ENDOSCOPY      Family History  Problem Relation Age of Onset   Diabetes Mother    Heart disease Mother    Hypertension Mother    Ovarian cancer Mother    Melanoma Mother    Cancer Father    Coronary artery disease Father    Heart disease Father    Diabetes Sister    Depression Sister    Coronary artery disease Sister    Heart disease Sister    Pulmonary  Hypertension Sister    Rheum arthritis Maternal Aunt    Heart attack Paternal Uncle    Heart attack Paternal Uncle    Breast cancer Maternal Grandmother    Diabetes Maternal Grandmother    Diabetes Maternal Grandfather    Heart attack Cousin    Colon cancer Neg Hx     Social History   Socioeconomic History   Marital status: Married    Spouse name: Not on file   Number of children: 5   Years of education: Not on file   Highest education level: Not on file  Occupational History   Occupation: homemaker  Tobacco Use   Smoking status: Never    Passive exposure: Past   Smokeless tobacco: Never  Vaping Use   Vaping status: Never Used  Substance and Sexual Activity   Alcohol use: No   Drug use: No   Sexual activity: Not on file  Other Topics Concern   Not on file  Social History Narrative   Home maker   Social Determinants of Health   Financial Resource Strain: Not on file  Food Insecurity: Not on file  Transportation Needs: Not on file  Physical Activity: Not on file  Stress: Not on file  Social Connections: Not on file  Intimate Partner Violence: Not on file   Review of Systems     Objective:   Physical Exam Constitutional:      Appearance: Normal appearance.  HENT:     Head:     Comments: Right maxillary tenderness    Right Ear: Tympanic membrane and ear canal normal.     Left Ear: Tympanic membrane and ear canal normal.     Mouth/Throat:     Pharynx: No oropharyngeal  exudate or posterior oropharyngeal erythema.  Pulmonary:     Effort: Pulmonary effort is normal.     Breath sounds: Normal breath sounds. No wheezing or rales.  Musculoskeletal:     Cervical back: Neck supple.  Lymphadenopathy:     Cervical: No cervical adenopathy.  Neurological:     Mental Status: She is alert.  Psychiatric:     Comments: Mild anxiety            Assessment & Plan:

## 2023-09-11 NOTE — Assessment & Plan Note (Addendum)
Secondary depression and anxiety Now with occasional panic spells--will Rx alprazolam 0.25mg  for prn use Already on the duloxetine 60---could consider increasing if ongoing problems

## 2023-09-11 NOTE — Assessment & Plan Note (Signed)
Did come back Will try the augmentin 875 bid for 7 days--but extend to 2 weeks if not better quickly

## 2023-09-11 NOTE — Assessment & Plan Note (Signed)
May have to go off taltz due to the esophagitis

## 2023-09-12 MED ORDER — AMOXICILLIN-POT CLAVULANATE 875-125 MG PO TABS: 1.0000 | ORAL_TABLET | Freq: Two times a day (BID) | ORAL | 1 refills | Status: DC

## 2023-09-12 NOTE — Telephone Encounter (Signed)
Spoke to pt

## 2023-09-12 NOTE — Telephone Encounter (Signed)
Tell her I am sorry about that. I did send it in for a week, with a refill in case she needs to extend it

## 2023-09-19 ENCOUNTER — Ambulatory Visit: Payer: 59 | Admitting: Dermatology

## 2023-09-19 ENCOUNTER — Other Ambulatory Visit: Payer: Self-pay | Admitting: Internal Medicine

## 2023-09-19 MED ORDER — HYDROCODONE-ACETAMINOPHEN 5-325 MG PO TABS: 1.0000 | ORAL_TABLET | ORAL | 0 refills | Status: DC | PRN

## 2023-09-28 ENCOUNTER — Other Ambulatory Visit: Payer: Self-pay | Admitting: Internal Medicine

## 2023-09-28 DIAGNOSIS — E1142 Type 2 diabetes mellitus with diabetic polyneuropathy: Secondary | ICD-10-CM

## 2023-10-02 ENCOUNTER — Other Ambulatory Visit: Payer: Self-pay | Admitting: Internal Medicine

## 2023-10-02 ENCOUNTER — Other Ambulatory Visit: Payer: Self-pay | Admitting: Dermatology

## 2023-10-02 DIAGNOSIS — E1165 Type 2 diabetes mellitus with hyperglycemia: Secondary | ICD-10-CM

## 2023-10-02 DIAGNOSIS — L409 Psoriasis, unspecified: Secondary | ICD-10-CM

## 2023-10-03 ENCOUNTER — Ambulatory Visit: Payer: 59 | Admitting: Dermatology

## 2023-10-04 LAB — HM DIABETES EYE EXAM

## 2023-10-17 ENCOUNTER — Other Ambulatory Visit: Payer: Self-pay | Admitting: Internal Medicine

## 2023-10-17 DIAGNOSIS — E1142 Type 2 diabetes mellitus with diabetic polyneuropathy: Secondary | ICD-10-CM

## 2023-10-22 ENCOUNTER — Ambulatory Visit: Payer: 59 | Admitting: Dermatology

## 2023-10-25 ENCOUNTER — Emergency Department
Admission: EM | Admit: 2023-10-25 | Discharge: 2023-10-25 | Disposition: A | Discharge status: Home / Self Care | Payer: 59 | Attending: Emergency Medicine | Admitting: Emergency Medicine

## 2023-10-25 ENCOUNTER — Encounter: Payer: Self-pay | Admitting: Emergency Medicine

## 2023-10-25 ENCOUNTER — Emergency Department: Payer: 59

## 2023-10-25 ENCOUNTER — Other Ambulatory Visit: Payer: Self-pay

## 2023-10-25 DIAGNOSIS — R55 Syncope and collapse: Secondary | ICD-10-CM | POA: Diagnosis present

## 2023-10-25 DIAGNOSIS — J45909 Unspecified asthma, uncomplicated: Secondary | ICD-10-CM | POA: Insufficient documentation

## 2023-10-25 DIAGNOSIS — J329 Chronic sinusitis, unspecified: Secondary | ICD-10-CM | POA: Insufficient documentation

## 2023-10-25 DIAGNOSIS — E119 Type 2 diabetes mellitus without complications: Secondary | ICD-10-CM | POA: Diagnosis not present

## 2023-10-25 DIAGNOSIS — I10 Essential (primary) hypertension: Secondary | ICD-10-CM | POA: Insufficient documentation

## 2023-10-25 DIAGNOSIS — R519 Headache, unspecified: Secondary | ICD-10-CM

## 2023-10-25 LAB — BASIC METABOLIC PANEL
Anion gap: 10 (ref 5–15)
BUN: 17 mg/dL (ref 8–23)
CO2: 25 mmol/L (ref 22–32)
Calcium: 9.2 mg/dL (ref 8.9–10.3)
Chloride: 104 mmol/L (ref 98–111)
Creatinine, Ser: 0.9 mg/dL (ref 0.44–1.00)
GFR, Estimated: >60 mL/min (ref >60)
Glucose, Bld: 164 mg/dL — ABNORMAL HIGH (ref 70–99)
Potassium: 3.4 mmol/L — ABNORMAL LOW (ref 3.5–5.1)
Sodium: 139 mmol/L (ref 135–145)

## 2023-10-25 LAB — CBC
HCT: 41.5 % (ref 36.0–46.0)
Hemoglobin: 13.3 g/dL (ref 12.0–15.0)
MCH: 27.4 pg (ref 26.0–34.0)
MCHC: 32 g/dL (ref 30.0–36.0)
MCV: 85.6 fL (ref 80.0–100.0)
Platelets: 241 10*3/uL (ref 150–400)
RBC: 4.85 MIL/uL (ref 3.87–5.11)
RDW: 15.9 % — ABNORMAL HIGH (ref 11.5–15.5)
WBC: 8.5 10*3/uL (ref 4.0–10.5)
nRBC: 0 % (ref 0.0–0.2)

## 2023-10-25 MED ORDER — PROCHLORPERAZINE MALEATE 10 MG PO TABS: 10.0000 mg | ORAL_TABLET | Freq: Four times a day (QID) | ORAL | 0 refills | Status: DC | PRN

## 2023-10-25 MED ORDER — MOXIFLOXACIN HCL 400 MG PO TABS: 400.0000 mg | ORAL_TABLET | Freq: Every day | ORAL | 0 refills | Status: AC

## 2023-10-25 MED ORDER — DIPHENHYDRAMINE HCL 50 MG/ML IJ SOLN
25.0000 mg | Freq: Once | INTRAMUSCULAR | Status: AC
  Administered 2023-10-25: 25 mg via INTRAVENOUS
  Filled 2023-10-25: qty 1

## 2023-10-25 MED ORDER — PROCHLORPERAZINE EDISYLATE 10 MG/2ML IJ SOLN
10.0000 mg | Freq: Once | INTRAMUSCULAR | Status: AC
  Administered 2023-10-25: 10 mg via INTRAVENOUS
  Filled 2023-10-25: qty 2

## 2023-10-25 NOTE — ED Notes (Signed)
See triage note  Presents with some dizziness and states that her head just doesn't feel right   Has had 3 sinus infections Denies any fever and is currently afebrile   But states she almost passed out

## 2023-10-25 NOTE — ED Provider Notes (Signed)
[facility] Provider Note    Event Date/Time   First MD Initiated Contact with Patient 10/25/23 1813     (approximate)   History   Chief Complaint Near Syncope   HPI  Shannon [name] is a 67 y.o. female with past medical history of hypertension, diabetes, and asthma who presents to the ED complaining near-syncope.  Patient reports that she has been dealing with significant pressure across the right side of her head with nasal drainage for the past couple of weeks.  She states that she has had multiple rounds of Augmentin for this without relief, continues to have greenish drainage from the right side of her nose.  She denies any fevers, states that her headache got acutely worse earlier this evening when she became lightheaded and felt like she was going to pass out.  She describes feeling like her head is heavy, denies any vision changes, speech changes, numbness, or weakness.  She has not had any chest pain or shortness of breath.     Physical Exam   Triage Vital Signs: ED Triage Vitals  Encounter Vitals Group     BP 10/25/23 1657 (!) 140/68     Systolic BP Percentile --      Diastolic BP Percentile --      Pulse Rate 10/25/23 1657 92     Resp 10/25/23 1657 16     Temp 10/25/23 1657 98.5 F (36.9 C)     Temp Source 10/25/23 1657 Oral     SpO2 10/25/23 1657 95 %     Weight 10/25/23 1659 174 lb (78.9 kg)     Height 10/25/23 1659 5\' 4"  (1.626 m)     Head Circumference --      Peak Flow --      Pain Score 10/25/23 1659 8     Pain Loc --      Pain Education --      Exclude from Growth Chart --     Most recent vital signs: Vitals:   10/25/23 1657 10/25/23 2000  BP: (!) 140/68 (!) 141/76  Pulse: 92 83  Resp: 16   Temp: 98.5 F (36.9 C)   SpO2: 95% 94%    Constitutional: Alert and oriented. Eyes: Conjunctivae are normal. Head: Atraumatic.  No temporal tenderness bilaterally. Nose: No congestion/rhinnorhea. Mouth/Throat: Mucous membranes  are moist.  Neck: Supple with no meningismus. Cardiovascular: Normal rate, regular rhythm. Grossly normal heart sounds.  2+ radial pulses bilaterally. Respiratory: Normal respiratory effort.  No retractions. Lungs CTAB. Gastrointestinal: Soft and nontender. No distention. Musculoskeletal: No lower extremity tenderness nor edema.  Neurologic:  Normal speech and language. No gross focal neurologic deficits are appreciated.    ED Results / Procedures / Treatments   Labs (all labs ordered are listed, but only abnormal results are displayed) Labs Reviewed  BASIC METABOLIC PANEL - Abnormal; Notable for the following components:      Result Value   Potassium 3.4 (*)    Glucose, Bld 164 (*)    All other components within normal limits  CBC - Abnormal; Notable for the following components:   RDW 15.9 (*)    All other components within normal limits  URINALYSIS, ROUTINE W REFLEX MICROSCOPIC  CBG MONITORING, ED     EKG  ED ECG REPORT I, Chesley Noon, the attending physician, personally viewed and interpreted this ECG.   Date: 10/25/2023  EKG Time: 17:11  Rate: 93  Rhythm: normal sinus rhythm  Axis: Normal  Intervals:none  ST&T Change: None  RADIOLOGY CT head reviewed and interpreted by me with no hemorrhage or midline shift.  PROCEDURES:  Critical Care performed: No  Procedures   MEDICATIONS ORDERED IN ED: Medications  prochlorperazine (COMPAZINE) injection 10 mg (10 mg Intravenous Given 10/25/23 1945)  diphenhydrAMINE (BENADRYL) injection 25 mg (25 mg Intravenous Given 10/25/23 1942)     IMPRESSION / MDM / ASSESSMENT AND PLAN / ED COURSE  I reviewed the triage vital signs and the nursing notes.                              67 y.o. female with past medical history of hypertension, diabetes, and asthma who presents to the ED complaining of acute headache on top of sinus pressure for the past 3 weeks with near syncopal episode.  Patient's presentation is most  consistent with acute presentation with potential threat to life or bodily function.  Differential diagnosis includes, but is not limited to, SAH, meningitis, migraine headache, tension headache, sinusitis, arrhythmia, anemia, electrolyte abnormality, AKI.  Patient well-appearing and in no acute distress, vital signs are unremarkable.  EKG shows no evidence arrhythmia or ischemia and I doubt cardiac etiology for her near syncopal episode.  She is currently primarily complaining of headache, states this got acutely worse earlier this evening and we will check CT head.  No findings concerning for meningitis and no focal neurologic deficits noted on exam.  Labs are reassuring with no significant anemia, leukocytosis, electrolyte abnormality, or AKI.  We will treat symptomatically with IV Compazine and Benadryl, reassess following imaging.  CT head is negative for acute intracranial process, does show evidence of chronic sinusitis on the right which correlates with patient's symptoms.  Patient feeling better on reassessment and is appropriate for discharge home with outpatient follow-up with ENT.  We will change her antibiotic to Avelox and she was counseled to return to the ED for new or worsening symptoms, patient agrees with plan.      FINAL CLINICAL IMPRESSION(S) / ED DIAGNOSES   Final diagnoses:  Chronic sinusitis, unspecified location  Acute nonintractable headache, unspecified headache type     Rx / DC Orders   ED Discharge Orders          Ordered    moxifloxacin (AVELOX) 400 MG tablet  Daily        10/25/23 2118    prochlorperazine (COMPAZINE) 10 MG tablet  Every 6 hours PRN        10/25/23 2118             Note:  This document was prepared using Dragon voice recognition software and may include unintentional dictation errors.   Chesley Noon, MD 10/25/23 2119

## 2023-10-25 NOTE — ED Triage Notes (Signed)
Pt to ED via POV. Pt states for the past few days she has not been feeling well. Pt states that when she moves her head or her eyes she gets a lightheaded feeling. Pt states that she has also had diarrhea and this morning she woke up with her eyes swollen. Pt reports that today she had a near syncopal episode. Pt states that she got lightheaded and her ears started ringing. Pt also reports that she has had a sinus infection for the past 3 weeks and that she has been on 3 different antibiotics for it but it has not improved. Pt is currently in NAD.

## 2023-10-30 ENCOUNTER — Ambulatory Visit: Payer: 59 | Admitting: Adult Health

## 2023-10-30 ENCOUNTER — Encounter: Payer: Self-pay | Admitting: Adult Health

## 2023-10-30 VITALS — BP 110/68 | HR 101 | Temp 97.6°F | Ht 64.0 in | Wt 176.0 lb

## 2023-10-30 DIAGNOSIS — J329 Chronic sinusitis, unspecified: Secondary | ICD-10-CM | POA: Diagnosis not present

## 2023-10-30 DIAGNOSIS — G4733 Obstructive sleep apnea (adult) (pediatric): Secondary | ICD-10-CM | POA: Diagnosis not present

## 2023-10-30 DIAGNOSIS — J321 Chronic frontal sinusitis: Secondary | ICD-10-CM

## 2023-10-30 NOTE — Assessment & Plan Note (Signed)
Severe obstructive sleep apnea patient has good control on CPAP.  She is encouraged on daily use.  Has trouble tolerating CPAP currently due to sinus infection.  Hopefully once her sinus symptoms improve she will be able to use her CPAP more consistently.  Plan  Patient Instructions  Finish Avelox as directed.  Continue on Zyrtec and Flonase daily  Hold Astelin for 1 week and then restart.  Saline nasal spray Three times a day  As needed   Saline nasal gel At bedtime   Follow up with ENT as planned.    Continue on CPAP At bedtime, wear all night long for at least 6hr or more.  Work on healthy weight loss  Do not drive if sleepy  Healthy sleep regimen  Follow up in 6 months  and As needed

## 2023-10-30 NOTE — Patient Instructions (Addendum)
Finish Avelox as directed.  Continue on Zyrtec and Flonase daily  Hold Astelin for 1 week and then restart.  Saline nasal spray Three times a day  As needed   Saline nasal gel At bedtime   Follow up with ENT as planned.    Continue on CPAP At bedtime, wear all night long for at least 6hr or more.  Work on healthy weight loss  Do not drive if sleepy  Healthy sleep regimen  Follow up in 6 months  and As needed

## 2023-10-30 NOTE — Progress Notes (Signed)
@Patient  ID: Shannon [name], female    DOB: 01/18/1956, 67 y.o.   MRN: [id number]  Chief Complaint  Patient presents with   Follow-up    Referring provider: Karie Schwalbe, MD  HPI:  67 year old female seen for sleep consult November 2023 for snoring found to have severe obstructive sleep apnea Medical history significant for type 2 diabetes insulin-dependent and psoriatic arthritis   TEST/EVENTS :  home sleep study that showed very severe sleep apnea with AHI 56/hour and SpO2 low at 77%..    10/30/2023 Follow up ; OSA  Patient presents for a follow-up visit.  Last seen March 2024.  Patient was seen last visit after recent home sleep study showed severe sleep apnea with AHI 56/hour and SpO2 low at 77%.  Patient was started on CPAP therapy.  Since last visit patient says she was doing very well until last few weeks when she developed sinus infection.  Feels that she was benefiting from CPAP with decreased daytime sleepiness. CPAP download shows 73% compliance with daily average usage at 6 hours, patient is on auto CPAP 5 to 15 cm H2O.  AHI 3.1/hour daily average pressure at 10.6 cm H2O.  Patient says she has been on 3-4 different antibiotics over the last few weeks for sinus infection.  Was recently seen in the emergency room with CT head showing right sided sinus thickening.  Patient is currently on Avelox has 2 to 3 days left of her prescription.  Patient continues to have some right sided sinus pressure ear fullness and discharge.  She is currently on Zyrtec and Flonase and Astelin.  She has been recommended to follow-up with ENT.  She is currently waiting for a call back for an appointment.   Allergies  Allergen Reactions   Humalog [Insulin Lispro] Swelling   Lisinopril Cough   Nitrofurantoin Other (See Comments)    Pt unsure   Oxycodone-Acetaminophen Itching   Percocet [Oxycodone-Acetaminophen]    Atorvastatin Diarrhea    May have caused colitis--but not clear cut    Clarithromycin Palpitations   Glipizide Other (See Comments)    Sugars too variable   Invokana [Canagliflozin]     Nausea, constipation    Immunization History  Administered Date(s) Administered   Influenza Split 10/19/2011, 09/16/2012   Influenza Whole 09/23/2007, 08/28/2008, 09/09/2009, 09/05/2010   Influenza, Seasonal, Injecte, Preservative Fre 07/26/2023   Influenza,inj,Quad PF,6+ Mos 09/22/2014, 08/09/2015, 11/16/2016, 09/03/2018, 09/09/2019, 10/05/2022   Influenza-Unspecified 09/04/2017, 09/03/2020, 10/11/2021   Moderna Sars-Covid-2 Vaccination 08/20/2020, 09/17/2020   Pneumococcal Conjugate-13 06/18/2017   Pneumococcal Polysaccharide-23 09/16/2012, 06/07/2021   Td 11/20/2004   Tdap 05/10/2015   Zoster Recombinant(Shingrix) 12/17/2018, 04/15/2019    Past Medical History:  Diagnosis Date   Actinic keratosis    Allergy    Arthritis    hip, back, shoulders, joints   Asthma    Complication of anesthesia    problems with block during breast augmentation and epidural during childbirth   Depression    Diabetes mellitus 09/2011   type 2   Family history of adverse reaction to anesthesia    PROBLEMS WITH BLOCKS   Fatty liver    GERD (gastroesophageal reflux disease)    Glaucoma suspect    Headache    migraines-4x a week headache-1x week migraine   History of hiatal hernia    Hyperlipidemia    Hypertension    Controlled on meds   Mitral valve prolapse    Nose colonized with MRSA 05/15/2018   Osteopenia  Pneumonia    PONV (postoperative nausea and vomiting)    Psoriasis    Shingles 11/2006   Sleep disturbance    Tachycardia    Vertigo    Hx of    Tobacco History: Social History   Tobacco Use  Smoking Status Never   Passive exposure: Past  Smokeless Tobacco Never   Counseling given: Not Answered   Outpatient Medications Prior to Visit  Medication Sig Dispense Refill   ALPRAZolam (XANAX) 0.25 MG tablet Take 1 tablet (0.25 mg total) by mouth 2 (two)  times daily as needed for anxiety. 20 tablet 0   aspirin-acetaminophen-caffeine (EXCEDRIN MIGRAINE) 250-250-65 MG tablet Take 1 tablet by mouth every 6 (six) hours as needed for headache.     azelastine (ASTELIN) 0.1 % nasal spray Place 1 spray into both nostrils 2 (two) times daily. Use in each nostril as directed 30 mL 12   Blood Glucose Monitoring Suppl (FIFTY50 GLUCOSE METER 2.0) w/Device KIT      Blood Glucose Monitoring Suppl (ONETOUCH VERIO) w/Device KIT Use as advised 1 kit 1   cetirizine (ZYRTEC) 10 MG tablet Take 10 mg by mouth daily.      clobetasol (TEMOVATE) 0.05 % external solution MIX IN JAR OF CERAVE CREAM, THEN APPLY TO BACK TWICE DAILY UNTIL ITCH IMPROVED - AVOID FACE, GROIN, AND AXILLA 50 mL 1   Continuous Glucose Sensor (DEXCOM G6 SENSOR) MISC CHANGE 1 SENSOR EVERY 10 DAYS AS DIRECTED 9 each 3   Continuous Glucose Transmitter (DEXCOM G6 TRANSMITTER) MISC USE 1 TRANSMITTER EVERY 3 MONTHS 1 each 3   docusate sodium (COLACE) 100 MG capsule Take 300 mg by mouth at bedtime.      DULoxetine (CYMBALTA) 60 MG capsule Take 1 capsule (60 mg total) by mouth daily. 90 capsule 3   Fluocinolone Acetonide 0.01 % OIL Apply 1-2 gtts to ears once to twice daily as needed until improved. 20 mL 2   fluticasone (FLONASE) 50 MCG/ACT nasal spray Place 1 spray into both nostrils daily.     furosemide (LASIX) 20 MG tablet TAKE 1 TABLET BY MOUTH DAILY 90 tablet 3   gabapentin (NEURONTIN) 100 MG capsule Take 100mg  twice a day and 2 at bedtime 360 capsule 3   Glucagon 3 MG/DOSE POWD Place 3 mg into the nose once as needed for up to 1 dose. 1 each 11   glucose blood (ONETOUCH ULTRA) test strip Use as instructed 2x a day 100 strip 11   HUMULIN R 500 UNIT/ML injection INJECT SUBCUTANEOUSLY UP TO 200  UNITS IN THE INSULIN PUMP DAILY 60 mL 3   HYDROcodone-acetaminophen (NORCO/VICODIN) 5-325 MG tablet Take 1 tablet by mouth every 4 (four) hours as needed for moderate pain (pain score 4-6). 60 tablet 0   Insulin  Disposable Pump (OMNIPOD 5 DEXG7G6 INTRO GEN 5) KIT USE AS DIRECTED 1 kit 0   Insulin Disposable Pump (OMNIPOD 5 DEXG7G6 PODS GEN 5) MISC USE 1 POD EVERY OTHER DAY 45 each 3   JARDIANCE 10 MG TABS tablet TAKE 1 TABLET BY MOUTH DAILY  BEFORE BREAKFAST 90 tablet 3   LINZESS 72 MCG capsule Take 72 mcg by mouth daily.     LUMIGAN 0.01 % SOLN Place 1 drop into both eyes at bedtime.      metoprolol succinate (TOPROL-XL) 25 MG 24 hr tablet TAKE 1 AND 1/2 TABLETS BY MOUTH  DAILY 135 tablet 3   mometasone (ELOCON) 0.1 % cream Apply 1 Application topically daily.  moxifloxacin (AVELOX) 400 MG tablet Take 1 tablet (400 mg total) by mouth daily at 8 pm for 7 days. 7 tablet 0   ondansetron (ZOFRAN-ODT) 4 MG disintegrating tablet DISSOLVE 1 TABLET ON THE TONGUE  EVERY 6 HOURS AS NEEDED FOR  NAUSEA AND VOMITING 180 tablet 0   OneTouch Delica Lancets 33G MISC Use 2x a day 100 each 11   potassium chloride SA (KLOR-CON M) 20 MEQ tablet Take 1 tablet (20 mEq total) by mouth daily. 30 tablet 11   prochlorperazine (COMPAZINE) 10 MG tablet Take 1 tablet (10 mg total) by mouth every 6 (six) hours as needed. 12 tablet 0   RABEprazole (ACIPHEX) 20 MG tablet Take 1 tablet (20 mg total) by mouth daily. 90 tablet 3   rosuvastatin (CRESTOR) 10 MG tablet Take 1 tablet (10 mg total) by mouth daily. (Patient taking differently: Take 20 mg by mouth daily.) 90 tablet 3   SKYRIZI PEN 150 MG/ML pen Inject into the skin.     traZODone (DESYREL) 100 MG tablet TAKE 2 TABLETS BY MOUTH EVERY  NIGHT AT BEDTIME 180 tablet 3   triamterene-hydrochlorothiazide (MAXZIDE-25) 37.5-25 MG tablet TAKE 1 TABLET BY MOUTH ONCE  DAILY 90 tablet 3   valACYclovir (VALTREX) 1000 MG tablet TAKE 2 TABLETS BY MOUTH AT ONSET OF SYMPTOMS AS NEEDED FOR FEVER  BLISTERS AND TAKE 2 TABLETS BY  MOUTH 12 HOURS LATER 90 tablet 3   amoxicillin-clavulanate (AUGMENTIN) 875-125 MG tablet Take 1 tablet by mouth 2 (two) times daily. 14 tablet 1   fluconazole (DIFLUCAN)  150 MG tablet Take 1 tablet (150 mg total) by mouth once a week. 2 tablet 1   TALTZ 80 MG/ML SOAJ Inject into the skin.     tiZANidine (ZANAFLEX) 2 MG tablet Take 1-2 tablets (2-4 mg total) by mouth 3 (three) times daily as needed for muscle spasms. 60 tablet 1   No facility-administered medications prior to visit.     Review of Systems:   Constitutional:   No  weight loss, night sweats,  Fevers, chills, fatigue, or  lassitude.  HEENT:   No headaches,  Difficulty swallowing,  Tooth/dental problems, or  Sore throat,                No sneezing, itching, ear ache,+ nasal congestion, post nasal drip,   CV:  No chest pain,  Orthopnea, PND, swelling in lower extremities, anasarca, dizziness, palpitations, syncope.   GI  No heartburn, indigestion, abdominal pain, nausea, vomiting, diarrhea, change in bowel habits, loss of appetite, bloody stools.   Resp:   No chest wall deformity  Skin: no rash or lesions.  GU: no dysuria, change in color of urine, no urgency or frequency.  No flank pain, no hematuria   MS:  No joint pain or swelling.  No decreased range of motion.  No back pain.    Physical Exam  BP 110/68 (BP Location: Left Arm, Patient Position: Sitting, Cuff Size: Normal)   Pulse (!) 101   Temp 97.6 F (36.4 C) (Temporal)   Ht 5\' 4"  (1.626 m)   Wt 176 lb (79.8 kg)   SpO2 96%   BMI 30.21 kg/m   GEN: A/Ox3; pleasant , NAD, well nourished    HEENT:  [place]/AT,  NOSE-clear, THROAT-clear, no lesions, no postnasal drip or exudate noted.   NECK:  Supple w/ fair ROM; no JVD; normal carotid impulses w/o bruits; no thyromegaly or nodules palpated; no lymphadenopathy.    RESP  Clear  P &  A; w/o, wheezes/ rales/ or rhonchi. no accessory muscle use, no dullness to percussion  CARD:  RRR, no m/r/g, no peripheral edema, pulses intact, no cyanosis or clubbing.  GI:   Soft & nt; nml bowel sounds; no organomegaly or masses detected.   Musco: Warm bil, no deformities or joint swelling  noted.   Neuro: alert, no focal deficits noted.    Skin: Warm, no lesions or rashes    Lab Results:    BNP No results found for: "BNP"  ProBNP No results found for: "PROBNP"  Imaging: CT Head Wo Contrast  Result Date: 10/25/2023 CLINICAL DATA:  Sudden severe headache. Unwell feeling for the last few days. Lightheadedness. EXAM: CT HEAD WITHOUT CONTRAST TECHNIQUE: Contiguous axial images were obtained from the base of the skull through the vertex without intravenous contrast. RADIATION DOSE REDUCTION: This exam was performed according to the departmental dose-optimization program which includes automated exposure control, adjustment of the mA and/or kV according to patient size and/or use of iterative reconstruction technique. COMPARISON:  MRI brain 04/27/2006 FINDINGS: Brain: No evidence of acute infarction, hemorrhage, hydrocephalus, extra-axial collection or mass lesion/mass effect. Vascular: No hyperdense vessel or unexpected calcification. Skull: Normal. Negative for fracture or focal lesion. Sinuses/Orbits: Dense mucosal thickening in the right maxillary antrum suggesting chronic sinusitis. Mastoid air cells are clear. Other: None. IMPRESSION: 1. No acute intracranial abnormalities. 2. Dense mucosal thickening in the right maxillary antrum, likely chronic sinusitis. Electronically Signed   By: Burman Nieves M.D.   On: 10/25/2023 20:30    Administration History     None           No data to display          No results found for: "NITRICOXIDE"      Assessment & Plan:   Sinusitis, chronic Slow to resolve sinusitis.  Patient is to complete antibiotics as recommended.  Recommend patient beginning saline nasal rinses 2-3 times a day.  Saline nasal gel at nighttime.  Hold Astelin for the next week.  Continue on Zyrtec and Flonase.  Follow-up with ENT as planned  Plan  Patient Instructions  Finish Avelox as directed.  Continue on Zyrtec and Flonase daily  Hold  Astelin for 1 week and then restart.  Saline nasal spray Three times a day  As needed   Saline nasal gel At bedtime   Follow up with ENT as planned.    Continue on CPAP At bedtime, wear all night long for at least 6hr or more.  Work on healthy weight loss  Do not drive if sleepy  Healthy sleep regimen  Follow up in 6 months  and As needed       OSA (obstructive sleep apnea) Severe obstructive sleep apnea patient has good control on CPAP.  She is encouraged on daily use.  Has trouble tolerating CPAP currently due to sinus infection.  Hopefully once her sinus symptoms improve she will be able to use her CPAP more consistently.  Plan  Patient Instructions  Finish Avelox as directed.  Continue on Zyrtec and Flonase daily  Hold Astelin for 1 week and then restart.  Saline nasal spray Three times a day  As needed   Saline nasal gel At bedtime   Follow up with ENT as planned.    Continue on CPAP At bedtime, wear all night long for at least 6hr or more.  Work on healthy weight loss  Do not drive if sleepy  Healthy sleep regimen  Follow up in  6 months  and As needed         Rubye Oaks, NP 10/30/2023

## 2023-10-30 NOTE — Assessment & Plan Note (Signed)
Slow to resolve sinusitis.  Patient is to complete antibiotics as recommended.  Recommend patient beginning saline nasal rinses 2-3 times a day.  Saline nasal gel at nighttime.  Hold Astelin for the next week.  Continue on Zyrtec and Flonase.  Follow-up with ENT as planned  Plan  Patient Instructions  Finish Avelox as directed.  Continue on Zyrtec and Flonase daily  Hold Astelin for 1 week and then restart.  Saline nasal spray Three times a day  As needed   Saline nasal gel At bedtime   Follow up with ENT as planned.    Continue on CPAP At bedtime, wear all night long for at least 6hr or more.  Work on healthy weight loss  Do not drive if sleepy  Healthy sleep regimen  Follow up in 6 months  and As needed

## 2023-10-31 ENCOUNTER — Ambulatory Visit: Payer: 59

## 2023-10-31 DIAGNOSIS — K219 Gastro-esophageal reflux disease without esophagitis: Secondary | ICD-10-CM | POA: Diagnosis not present

## 2023-10-31 DIAGNOSIS — B3781 Candidal esophagitis: Secondary | ICD-10-CM | POA: Diagnosis not present

## 2023-10-31 DIAGNOSIS — R1319 Other dysphagia: Secondary | ICD-10-CM | POA: Diagnosis not present

## 2023-10-31 DIAGNOSIS — R1013 Epigastric pain: Secondary | ICD-10-CM | POA: Diagnosis present

## 2023-11-06 ENCOUNTER — Encounter: Payer: Self-pay | Admitting: Internal Medicine

## 2023-11-06 ENCOUNTER — Ambulatory Visit (INDEPENDENT_AMBULATORY_CARE_PROVIDER_SITE_OTHER): Payer: 59 | Admitting: Internal Medicine

## 2023-11-06 VITALS — BP 120/60 | HR 89 | Ht 64.0 in | Wt 175.8 lb

## 2023-11-06 DIAGNOSIS — E782 Mixed hyperlipidemia: Secondary | ICD-10-CM | POA: Diagnosis not present

## 2023-11-06 DIAGNOSIS — E1142 Type 2 diabetes mellitus with diabetic polyneuropathy: Secondary | ICD-10-CM

## 2023-11-06 DIAGNOSIS — E1165 Type 2 diabetes mellitus with hyperglycemia: Secondary | ICD-10-CM

## 2023-11-06 DIAGNOSIS — Z794 Long term (current) use of insulin: Secondary | ICD-10-CM | POA: Diagnosis not present

## 2023-11-06 DIAGNOSIS — Z7984 Long term (current) use of oral hypoglycemic drugs: Secondary | ICD-10-CM | POA: Diagnosis not present

## 2023-11-06 LAB — POCT GLYCOSYLATED HEMOGLOBIN (HGB A1C): Hemoglobin A1C: 7.4 % — AB (ref 4.0–5.6)

## 2023-11-06 NOTE — Patient Instructions (Addendum)
Please use the following pump settings: - basal rates: 12 am: 1.0  3 am: 0.9  6 am: 0.7  9 am: 1 9 PM: 1.1  - ICR:  (start the bolus 45-60 min before the meal)  12 AM: 1:11  12 pm: 1:10  4 PM: 1:10 8 PM: 1:8 - target: 120-120  - ISF: 1:60 - Active insulin time: 6 hours  Also, continue: - Jardiance 10 mg before b'fast.  STOP SNACKS AT NIGHT, especially sweets.  Please return in 3 months.

## 2023-11-06 NOTE — Progress Notes (Addendum)
Patient ID: THIERRY [name], female   DOB: Sep 07, 1956, 67 y.o.   MRN: [id number]   HPI: Shannon [name] is a 67 y.o.-year-old female, initially referred by her PCP, Dr. Alphonsus Sias, returning for follow-up for DM2, dx in 2012, insulin-dependent since ~2019, uncontrolled, with complications (peripheral neuropathy). Last visit 4 months ago.    Interim history: She has occasional nausea, no increased urination, nausea. Since last visit, she had a lot of family stress and also had sinusitis.   She also has palpitations and she is seeing cardiology.  She still has a sweet snacks at night -apple, Nerds candy.  Reviewed history: Patient described that her sugars worsened after she had to stop Metformin due to GI symptoms and was changed to sulfonylurea. They increased even further after steroid injection for Achilles tendinitis.  Her sugars were initially in the 300-400 range, but then increased to 500-HI (>600) >> she presented to the emergency room (11/12/2020).  Afterwards, she started to work on her diet.  Insulin pump: -OmniPod Dash >> Omnipod 5  Insulin: -She has allergy to Humalog/Lyumjev >> significant irritation and inflammation at the infusion site -Switched to NovoLog samples since NovoLog was not covered by her insurance despite 2 preauthorization requests - U500 now  CGM: -Dexcom G6  Supplies: -Mail-order pharmacy for Dexcom  Reviewed HbA1c levels: 07/06/2023: HbA1c 6.8% Lab Results  Component Value Date   HGBA1C 6.9 (A) 03/06/2023   HGBA1C 7.3 (A) 11/23/2022   HGBA1C 8.5 (A) 07/20/2022   HGBA1C 8.5 (A) 03/16/2022   HGBA1C 8.2 (A) 11/15/2021   HGBA1C 8.0 (A) 07/22/2021   HGBA1C 10.4 (A) 02/15/2021   HGBA1C 9.0 (A) 11/16/2020   HGBA1C 7.8 (A) 07/13/2020   HGBA1C 8.1 (H) 12/22/2019   HGBA1C 7.3 (A) 07/04/2019   HGBA1C 8.9 (H) 01/22/2019   HGBA1C 8.8 (H) 10/15/2018   HGBA1C 7.9 (H) 02/13/2018   HGBA1C 7.0 (H) 09/19/2017   HGBA1C 8.6 (H) 04/13/2017   HGBA1C 8.2 (H)  11/16/2016   HGBA1C 7.4 (H) 05/17/2016   HGBA1C 7.8 (H) 11/19/2015   HGBA1C 7.2 (H) 04/23/2015   HGBA1C 8.4 (H) 01/14/2015   HGBA1C 8.6 (H) 09/22/2014   HGBA1C 8.0 (H) 03/31/2014   HGBA1C 6.9 (H) 09/23/2013   HGBA1C 7.0 (H) 03/17/2013   HGBA1C 6.5 09/16/2012   HGBA1C 6.6 (H) 02/20/2012   HGBA1C 9.0 (H) 10/19/2011   Previously on: - Glimepiride 2 >> 4 mg before breakfast   - Onglyza 5 mg before breakfast - Lantus 30-35 units at bedtime She is intolerant to Metformin >> N/V/D >> had to stop and changed to Glimepiride.  Previously on: - Ozempic 0.5 mg weekly >> stopped 2/2 nausea - Lantus 26 >> 34 >> Toujeo 46 units at bedtime - Lyumjev 6-10 >> 12-18 >> 15 >> 20 units before each meal  Now on: - Pump settings with U500 - changes suggested at last OV are shown in bold - basal rates: 12 am: 1.0  3 am: 0.9  6 am: 0.7  9 am: 1 9 PM: 1.1  - ICR:  (try to start the bolus 45-60 min before the meal)  12 AM: 1:11  12 pm: 1:11 >> 1:10  4 PM: 1:11 >> 1:10 8 PM: 1:8 - target: 120-120  - ISF: 1:60 - Active insulin time: 6 hours  Also, continue: - Jardiance 10 mg before b'fast -restarted 11/2022 -  -started 11/2022 >> stopped 2/2 severe constipation  TDD from basal insulin: 61% >> 67-78 %>> 66% >> 63% >>  62% >> 69% TDD from bolus insulin: 39% >> 22-33% >> 33% >> 38% >> 20% >> 32% Total daily dose: 32-40 U500 units/day >> up to 200 units of insulin a day - extended bolusing: not using - changes infusion site: q1-2 days  She checks her sugars more than 4 times a day with her  Dexcom CGM:  Previously:  Prev.:  Previously:   Lowest sugar was LO >> ... 55 >> 47 >> 40 >> 39 (sensor); she has hypoglycemia awareness at 110.  Highest sugar was HI ... >> 300s >> 400s >> 300s >> 300s  Glucometer: One Touch Verio  Pt's meals are: - Breakfast: usually skips, when eats b'fast: sausage bisquit - Lunch: salad or sandwich - Dinner: meat and 2 veggies - Snacks: 1-2: apples, oranges,  occasionally chips  -+ CKD, last BUN/creatinine:  Lab Results  Component Value Date   BUN 17 10/25/2023   BUN 14 08/08/2023   CREATININE 0.90 10/25/2023   CREATININE 0.99 08/08/2023   Lab Results  Component Value Date   MICRALBCREAT 0.9 08/08/2023   MICRALBCREAT 3.7 03/12/2023   MICRALBCREAT 1.8 05/17/2016   MICRALBCREAT 1.4 04/23/2015   MICRALBCREAT 2.7 09/23/2013   MICRALBCREAT 1.1 09/16/2012  She is not on ACE inhibitor/ARB. She had cough with lisinopril.  -+ HL; last set of lipids: Lab Results  Component Value Date   CHOL 164 08/08/2023   HDL 54.40 08/08/2023   LDLCALC 78 08/08/2023   LDLDIRECT 116.0 03/12/2023   TRIG 159.0 (H) 08/08/2023   CHOLHDL 3 08/08/2023  On Crestor 5 mg  daily.  - last eye exam was in 2024: No DR reportedly. She has glaucoma - exams q6 mo. Her blurry vision improved (had laser surgery for glaucoma 05/2021).   - She has numbness and tingling in her toes. She also has muscle cramps in her feet.  Last foot exam 03/12/2023.  Pt has FH of DM in mother, son, daughter, sister, MGM, MGF, M aunt and uncles.  She also has a history of HTN, fatty liver, GERD. She has psoriatic arthritis and ankylosing spondylitis. She has significant joint pain - hands, back, hips.  She is on a biologic infusion (Cosentyx). She presented to the ED for nausea on 12/24/2019.  Per review of the labs, she appeared dehydrated, but no distinct pathology was found.  Of note, lipase was normal. She started Lopressor for HTN - but had dizziness >> stopped.  TSH was normal: Lab Results  Component Value Date   TSH 1.24 08/08/2023   ROS: + see HPI  I reviewed pt's medications, allergies, PMH, social hx, family hx, and changes were documented in the history of present illness. Otherwise, unchanged from my initial visit note.  Past Medical History:  Diagnosis Date   Actinic keratosis    Allergy    Arthritis    hip, back, shoulders, joints   Asthma    Complication of  anesthesia    problems with block during breast augmentation and epidural during childbirth   Depression    Diabetes mellitus 09/2011   type 2   Family history of adverse reaction to anesthesia    PROBLEMS WITH BLOCKS   Fatty liver    GERD (gastroesophageal reflux disease)    Glaucoma suspect    Headache    migraines-4x a week headache-1x week migraine   History of hiatal hernia    Hyperlipidemia    Hypertension    Controlled on meds   Mitral valve prolapse  Nose colonized with MRSA 05/15/2018   Osteopenia    Pneumonia    PONV (postoperative nausea and vomiting)    Psoriasis    Shingles 11/2006   Sleep disturbance    Tachycardia    Vertigo    Hx of   Past Surgical History:  Procedure Laterality Date   ABDOMINAL HYSTERECTOMY  2000   /BSO - abn. Paps   APPENDECTOMY  1978   AUGMENTATION MAMMAPLASTY Bilateral 1981   BLADDER REPAIR     x2   BREAST ENHANCEMENT SURGERY  1987   CARDIAC CATHETERIZATION     [facility]   CARDIAC CATHETERIZATION Left 12/18/2016   Procedure: Left Heart Cath and Coronary Angiography;  Surgeon: Iran Ouch, MD;  Location: [facility] INVASIVE CV [facility];  Service: Cardiovascular;  Laterality: Left;   CARPAL TUNNEL RELEASE Bilateral    CERVICAL DISCECTOMY  11/2009   with synthetic discs inserted--  Dr Lovell Sheehan   COLONOSCOPY     JOINT REPLACEMENT     Left Hip Replacement Left 02/01/2023   NM MYOVIEW LTD  03/2005   EF 56%, no ischemia   RECTAL PROLAPSE REPAIR     x1   SHOULDER ARTHROSCOPY WITH OPEN ROTATOR CUFF REPAIR Left 12/29/2019   Procedure: LEFT SHOULDER ARTHROSCOPIC DISTAL CLAVICLE EXCISISON, SUBACROMIAL DECOMPRESSION,BICEPS TENOTOMY,ROTATOR CUFF REPAIR ;  Surgeon: Lyndle Herrlich, MD;  Location: [facility] ORS;  Service: Orthopedics;  Laterality: Left;   SHOULDER CLOSED REDUCTION Left 05/03/2020   Procedure: Left shoulder arthroscopic lysis of adhesions and manipulation under anesthesia;  Surgeon: Lyndle Herrlich, MD;  Location: [facility] ORS;  Service:  Orthopedics;  Laterality: Left;   SHOULDER SURGERY  09/2008, 01/2009   Right shoulder surgery, then redone with lysis of adhesions   TOTAL HIP ARTHROPLASTY Right 05/29/2018   Procedure: TOTAL HIP ARTHROPLASTY ANTERIOR APPROACH;  Surgeon: Lyndle Herrlich, MD;  Location: [facility] ORS;  Service: Orthopedics;  Laterality: Right;   TOTAL HIP ARTHROPLASTY Left 01/2023   UPPER GI ENDOSCOPY     Social History   Socioeconomic History   Marital status: Married    Spouse name: Not on file   Number of children: 5   Years of education: Not on file   Highest education level: Not on file  Occupational History   Occupation: homemaker  Tobacco Use   Smoking status: Never    Passive exposure: Past   Smokeless tobacco: Never  Vaping Use   Vaping status: Never Used  Substance and Sexual Activity   Alcohol use: No   Drug use: No   Sexual activity: Not on file  Other Topics Concern   Not on file  Social History Narrative   Home maker   Social Drivers of Health   Financial Resource Strain: Not on file  Food Insecurity: Not on file  Transportation Needs: Not on file  Physical Activity: Not on file  Stress: Not on file  Social Connections: Not on file  Intimate Partner Violence: Not on file   Current Outpatient Medications on File Prior to Visit  Medication Sig Dispense Refill   ALPRAZolam (XANAX) 0.25 MG tablet Take 1 tablet (0.25 mg total) by mouth 2 (two) times daily as needed for anxiety. 20 tablet 0   aspirin-acetaminophen-caffeine (EXCEDRIN MIGRAINE) 250-250-65 MG tablet Take 1 tablet by mouth every 6 (six) hours as needed for headache.     azelastine (ASTELIN) 0.1 % nasal spray Place 1 spray into both nostrils 2 (two) times daily. Use in each nostril as directed 30 mL 12  Blood Glucose Monitoring Suppl (FIFTY50 GLUCOSE METER 2.0) w/Device KIT      Blood Glucose Monitoring Suppl (ONETOUCH VERIO) w/Device KIT Use as advised 1 kit 1   cetirizine (ZYRTEC) 10 MG tablet Take 10 mg by mouth  daily.      clobetasol (TEMOVATE) 0.05 % external solution MIX IN JAR OF CERAVE CREAM, THEN APPLY TO BACK TWICE DAILY UNTIL ITCH IMPROVED - AVOID FACE, GROIN, AND AXILLA 50 mL 1   Continuous Glucose Sensor (DEXCOM G6 SENSOR) MISC CHANGE 1 SENSOR EVERY 10 DAYS AS DIRECTED 9 each 3   Continuous Glucose Transmitter (DEXCOM G6 TRANSMITTER) MISC USE 1 TRANSMITTER EVERY 3 MONTHS 1 each 3   docusate sodium (COLACE) 100 MG capsule Take 300 mg by mouth at bedtime.      DULoxetine (CYMBALTA) 60 MG capsule Take 1 capsule (60 mg total) by mouth daily. 90 capsule 3   Fluocinolone Acetonide 0.01 % OIL Apply 1-2 gtts to ears once to twice daily as needed until improved. 20 mL 2   fluticasone (FLONASE) 50 MCG/ACT nasal spray Place 1 spray into both nostrils daily.     furosemide (LASIX) 20 MG tablet TAKE 1 TABLET BY MOUTH DAILY 90 tablet 3   gabapentin (NEURONTIN) 100 MG capsule Take 100mg  twice a day and 2 at bedtime 360 capsule 3   Glucagon 3 MG/DOSE POWD Place 3 mg into the nose once as needed for up to 1 dose. 1 each 11   glucose blood (ONETOUCH ULTRA) test strip Use as instructed 2x a day 100 strip 11   HUMULIN R 500 UNIT/ML injection INJECT SUBCUTANEOUSLY UP TO 200  UNITS IN THE INSULIN PUMP DAILY 60 mL 3   HYDROcodone-acetaminophen (NORCO/VICODIN) 5-325 MG tablet Take 1 tablet by mouth every 4 (four) hours as needed for moderate pain (pain score 4-6). 60 tablet 0   Insulin Disposable Pump (OMNIPOD 5 DEXG7G6 INTRO GEN 5) KIT USE AS DIRECTED 1 kit 0   Insulin Disposable Pump (OMNIPOD 5 DEXG7G6 PODS GEN 5) MISC USE 1 POD EVERY OTHER DAY 45 each 3   JARDIANCE 10 MG TABS tablet TAKE 1 TABLET BY MOUTH DAILY  BEFORE BREAKFAST 90 tablet 3   LINZESS 72 MCG capsule Take 72 mcg by mouth daily.     LUMIGAN 0.01 % SOLN Place 1 drop into both eyes at bedtime.      metoprolol succinate (TOPROL-XL) 25 MG 24 hr tablet TAKE 1 AND 1/2 TABLETS BY MOUTH  DAILY 135 tablet 3   mometasone (ELOCON) 0.1 % cream Apply 1 Application  topically daily.     ondansetron (ZOFRAN-ODT) 4 MG disintegrating tablet DISSOLVE 1 TABLET ON THE TONGUE  EVERY 6 HOURS AS NEEDED FOR  NAUSEA AND VOMITING 180 tablet 0   OneTouch Delica Lancets 33G MISC Use 2x a day 100 each 11   potassium chloride SA (KLOR-CON M) 20 MEQ tablet Take 1 tablet (20 mEq total) by mouth daily. 30 tablet 11   prochlorperazine (COMPAZINE) 10 MG tablet Take 1 tablet (10 mg total) by mouth every 6 (six) hours as needed. 12 tablet 0   RABEprazole (ACIPHEX) 20 MG tablet Take 1 tablet (20 mg total) by mouth daily. 90 tablet 3   rosuvastatin (CRESTOR) 10 MG tablet Take 1 tablet (10 mg total) by mouth daily. (Patient taking differently: Take 20 mg by mouth daily.) 90 tablet 3   SKYRIZI PEN 150 MG/ML pen Inject into the skin.     traZODone (DESYREL) 100 MG tablet TAKE 2  TABLETS BY MOUTH EVERY  NIGHT AT BEDTIME 180 tablet 3   triamterene-hydrochlorothiazide (MAXZIDE-25) 37.5-25 MG tablet TAKE 1 TABLET BY MOUTH ONCE  DAILY 90 tablet 3   valACYclovir (VALTREX) 1000 MG tablet TAKE 2 TABLETS BY MOUTH AT ONSET OF SYMPTOMS AS NEEDED FOR FEVER  BLISTERS AND TAKE 2 TABLETS BY  MOUTH 12 HOURS LATER 90 tablet 3   No current facility-administered medications on file prior to visit.   Allergies  Allergen Reactions   Humalog [Insulin Lispro] Swelling   Lisinopril Cough   Nitrofurantoin Other (See Comments)    Pt unsure   Oxycodone-Acetaminophen Itching   Percocet [Oxycodone-Acetaminophen]    Atorvastatin Diarrhea    May have caused colitis--but not clear cut   Clarithromycin Palpitations   Glipizide Other (See Comments)    Sugars too variable   Invokana [Canagliflozin]     Nausea, constipation   Family History  Problem Relation Age of Onset   Diabetes Mother    Heart disease Mother    Hypertension Mother    Ovarian cancer Mother    Melanoma Mother    Cancer Father    Coronary artery disease Father    Heart disease Father    Diabetes Sister    Depression Sister     Coronary artery disease Sister    Heart disease Sister    Pulmonary Hypertension Sister    Rheum arthritis Maternal Aunt    Heart attack Paternal Uncle    Heart attack Paternal Uncle    Breast cancer Maternal Grandmother    Diabetes Maternal Grandmother    Diabetes Maternal Grandfather    Heart attack Cousin    Colon cancer Neg Hx    PE: Pulse 89   Ht 5\' 4"  (1.626 m)   Wt 175 lb 12.8 oz (79.7 kg)   SpO2 94%   BMI 30.18 kg/m  Wt Readings from Last 3 Encounters:  11/06/23 175 lb 12.8 oz (79.7 kg)  10/30/23 176 lb (79.8 kg)  10/25/23 174 lb (78.9 kg)   Constitutional: normal weight, in NAD Eyes: EOMI, no exophthalmos ENT: no thyromegaly, no cervical lymphadenopathy Cardiovascular: RRR, No MRG Respiratory: CTA B Musculoskeletal: no deformities Skin: no rashes Neurological: no tremor with outstretched hands  ASSESSMENT: 1. DM2, insulin-dependent, uncontrolled, without long-term complications, but with significant hyperglycemia  2. HL  PLAN:  1. Patient with history of poorly controlled type 2 diabetes, insulin resistant, previously on a basal-bolus insulin regimen with very high blood sugars, improved on U-500 insulin.  We tried Ozempic in the past but had to stop due to constant nausea.  We also tried Rybelsus but she developed severe constipation and had to stop.  We were then able to start Jardiance, which she continues.  She is currently on an insulin pump with U-500 insulin. At last visit she was starting the boluses only approximately 10 minutes before the meals and I advised her to move these 30 to 45 minutes prior to the meals.  We also stress send her insulin to carb ratio for breakfast and a snack at night but I did advise her to try to stop the latter.  At last visit HbA1c was the best she had in 10 years, at 6.8%, decreased even more from 6.9%. CGM interpretation: -At today's visit, we reviewed her CGM downloads: It appears that 54% of values are in target range (goal  >70%), while 46% are higher than 180 (goal <25%), and 0% are lower than 70 (goal <4%).  The calculated average  blood sugar is 184.  The projected HbA1c for the next 3 months (GMI) is 7.7%. -Reviewing the CGM trends, sugars are higher, particularly after dinner , when they are mostly in the upper 200s-300s but also after lunch: 180-250. -Upon sitting, she is not always able to inject insulin 45 to 60 minutes before the meal and we discussed about continuing to try to do so.  For lunch, it appears that she is on the right dose, but the timing of the insulin is important.  At night, sugars appear to be at goal after dinner, but she does have snacks in the evening, including candy.  She is occasionally injecting insulin for these, but even if she does, sugars increase significantly, to the 300s whenever she has these snacks.  I strongly advised her to try to avoid the evening snacks especially sweets.  Otherwise, I did not suggest a change in regimen. -I suggested: Patient Instructions  Please use the following pump settings: - basal rates: 12 am: 1.0  3 am: 0.9  6 am: 0.7  9 am: 1 9 PM: 1.1  - ICR:  (start the bolus 45-60 min before the meal)  12 AM: 1:11  12 pm: 1:10  4 PM: 1:10 8 PM: 1:8 - target: 120-120  - ISF: 1:60 - Active insulin time: 6 hours  Also, continue: - Jardiance 10 mg before b'fast.  STOP SNACKS AT NIGHT, especially sweets.  Please return in 3 months.  - we checked her HbA1c: 7.4% (higher) - advised to check sugars at different times of the day - 4x a day, rotating check times - advised for yearly eye exams >> she is UTD - return to clinic in 3 months  2. HL -Latest lipid panel was reviewed from 07/2023: LDL much improved, at goal now, triglycerides minimally elevated: Lab Results  Component Value Date   CHOL 164 08/08/2023   HDL 54.40 08/08/2023   LDLCALC 78 08/08/2023   LDLDIRECT 116.0 03/12/2023   TRIG 159.0 (H) 08/08/2023   CHOLHDL 3 08/08/2023  -She  continues Crestor 5 mg daily without side effects  Carlus Pavlov, MD PhD [facility] Endocrinology

## 2023-11-07 NOTE — Telephone Encounter (Signed)
Care team updated and records already abstracted to [facility].

## 2023-11-15 NOTE — Addendum Note (Signed)
Addended by: Pollie Meyer on: 11/15/2023 04:02 PM   Modules accepted: Orders

## 2023-11-20 ENCOUNTER — Other Ambulatory Visit: Payer: Self-pay | Admitting: Internal Medicine

## 2023-11-20 MED ORDER — HYDROCODONE-ACETAMINOPHEN 5-325 MG PO TABS: 1.0000 | ORAL_TABLET | ORAL | 0 refills | Status: DC | PRN

## 2023-11-20 NOTE — Telephone Encounter (Signed)
Last filled 09-19-23 #60 Last OV 09-11-23 Next OV 03-11-24 [facility] [address]  No UDS or CSA on file

## 2023-11-28 ENCOUNTER — Ambulatory Visit: Payer: 59 | Admitting: Neurology

## 2023-12-06 ENCOUNTER — Ambulatory Visit: Payer: 59 | Admitting: Neurology

## 2023-12-06 ENCOUNTER — Encounter: Payer: Self-pay | Admitting: Neurology

## 2023-12-06 ENCOUNTER — Telehealth: Payer: Self-pay | Admitting: Neurology

## 2023-12-06 NOTE — Telephone Encounter (Signed)
 Pt cx appt due to not feeling well. Will call back to r/s

## 2023-12-06 NOTE — Progress Notes (Deleted)
[facility]    Provider:  Dr Lucia Gaskins Requesting Provider: Karie Schwalbe, MD Primary Care Provider:  Karie Schwalbe, MD  CC:  Migraines  HPI:  Shannon [name] is a 68 y.o. female here as requested by Karie Schwalbe, MD for migraines. has Essential hypertension; Mitral valve disease; Allergic rhinitis; Asthma; GERD; FIBROCYSTIC BREAST DISEASE; Disorder of bone and cartilage; Type 2 diabetes mellitus with neurological manifestations, controlled (HCC); Routine general medical examination at a health care facility; Hyperlipidemia associated with type 2 diabetes mellitus (HCC); Colitis; Recurrent cold sores; Chest pain; Fatigue; Headache; Status post total hip replacement, right; Gouty arthritis of right great toe; Glaucoma; Mitral valve disorder; Noninfective gastroenteritis and colitis; Type 2 diabetes mellitus with hyperglycemia (HCC); Psoriatic arthritis (HCC); Mood disorder (HCC); Aortic atherosclerosis (HCC); Brain fog; Snoring; Left flank pain; Candida esophagitis (HCC); Back injury; Acute non-recurrent maxillary sinusitis; Palpitations; Sinusitis, chronic; and OSA (obstructive sleep apnea) on their problem list.   Reviewed notes, labs and imaging from outside physicians, which showed ***  Review of Systems: Patient complains of symptoms per HPI as well as the following symptoms ***. Pertinent negatives and positives per HPI. All others negative.   Social History   Socioeconomic History   Marital status: Married    Spouse name: Not on file   Number of children: 5   Years of education: Not on file   Highest education level: Not on file  Occupational History   Occupation: homemaker  Tobacco Use   Smoking status: Never    Passive exposure: Past   Smokeless tobacco: Never  Vaping Use   Vaping status: Never Used  Substance and Sexual Activity   Alcohol use: No   Drug use: No   Sexual activity: Not on file  Other Topics Concern   Not on file  Social  History Narrative   Home maker   Social Drivers of Health   Financial Resource Strain: Not on file  Food Insecurity: Not on file  Transportation Needs: Not on file  Physical Activity: Not on file  Stress: Not on file  Social Connections: Not on file  Intimate Partner Violence: Not on file    Family History  Problem Relation Age of Onset   Diabetes Mother    Heart disease Mother    Hypertension Mother    Ovarian cancer Mother    Melanoma Mother    Cancer Father    Coronary artery disease Father    Heart disease Father    Diabetes Sister    Depression Sister    Coronary artery disease Sister    Heart disease Sister    Pulmonary Hypertension Sister    Rheum arthritis Maternal Aunt    Heart attack Paternal Uncle    Heart attack Paternal Uncle    Breast cancer Maternal Grandmother    Diabetes Maternal Grandmother    Diabetes Maternal Grandfather    Heart attack Cousin    Colon cancer Neg Hx     Past Medical History:  Diagnosis Date   Actinic keratosis    Allergy    Arthritis    hip, back, shoulders, joints   Asthma    Complication of anesthesia    problems with block during breast augmentation and epidural during childbirth   Depression    Diabetes mellitus 09/2011   type 2   Family history of adverse reaction to anesthesia    PROBLEMS WITH BLOCKS   Fatty liver    GERD (gastroesophageal reflux disease)  Glaucoma suspect    Headache    migraines-4x a week headache-1x week migraine   History of hiatal hernia    Hyperlipidemia    Hypertension    Controlled on meds   Mitral valve prolapse    Nose colonized with MRSA 05/15/2018   Osteopenia    Pneumonia    PONV (postoperative nausea and vomiting)    Psoriasis    Shingles 11/2006   Sleep disturbance    Tachycardia    Vertigo    Hx of    Patient Active Problem List   Diagnosis Date Noted   Sinusitis, chronic 10/30/2023   OSA (obstructive sleep apnea) 10/30/2023   Palpitations 09/07/2023   Back  injury 08/16/2023   Acute non-recurrent maxillary sinusitis 08/16/2023   Left flank pain 07/26/2023   Candida esophagitis (HCC) 07/26/2023   Snoring 10/19/2022   Brain fog 05/12/2022   Aortic atherosclerosis (HCC) 03/29/2022   Mood disorder (HCC) 03/07/2022   Psoriatic arthritis (HCC) 09/16/2021   Gouty arthritis of right great toe 07/04/2019   Status post total hip replacement, right 05/29/2018   Headache 05/20/2018   Fatigue 04/20/2017   Chest pain 11/16/2016   Recurrent cold sores 03/07/2016   Type 2 diabetes mellitus with hyperglycemia (HCC) 11/04/2014   Colitis 03/17/2014   Noninfective gastroenteritis and colitis 03/17/2014   Routine general medical examination at a health care facility 09/16/2012   Hyperlipidemia associated with type 2 diabetes mellitus (HCC)    Type 2 diabetes mellitus with neurological manifestations, controlled (HCC)    FIBROCYSTIC BREAST DISEASE 06/12/2007   Essential hypertension 05/30/2007   Mitral valve disease 05/30/2007   Asthma 05/30/2007   Disorder of bone and cartilage 05/30/2007   Glaucoma 05/30/2007   Mitral valve disorder 05/30/2007   Allergic rhinitis 03/20/2007   GERD 03/20/2007    Past Surgical History:  Procedure Laterality Date   ABDOMINAL HYSTERECTOMY  2000   /BSO - abn. Paps   APPENDECTOMY  1978   AUGMENTATION MAMMAPLASTY Bilateral 1981   BLADDER REPAIR     x2   BREAST ENHANCEMENT SURGERY  1987   CARDIAC CATHETERIZATION     [facility]   CARDIAC CATHETERIZATION Left 12/18/2016   Procedure: Left Heart Cath and Coronary Angiography;  Surgeon: Iran Ouch, MD;  Location: [facility];  Service: Cardiovascular;  Laterality: Left;   CARPAL TUNNEL RELEASE Bilateral    CERVICAL DISCECTOMY  11/2009   with synthetic discs inserted--  Dr Lovell Sheehan   COLONOSCOPY     JOINT REPLACEMENT     Left Hip Replacement Left 02/01/2023   NM MYOVIEW LTD  03/2005   EF 56%, no ischemia   RECTAL PROLAPSE REPAIR     x1   SHOULDER ARTHROSCOPY  WITH OPEN ROTATOR CUFF REPAIR Left 12/29/2019   Procedure: LEFT SHOULDER ARTHROSCOPIC DISTAL CLAVICLE EXCISISON, SUBACROMIAL DECOMPRESSION,BICEPS TENOTOMY,ROTATOR CUFF REPAIR ;  Surgeon: Lyndle Herrlich, MD;  Location: [facility];  Service: Orthopedics;  Laterality: Left;   SHOULDER CLOSED REDUCTION Left 05/03/2020   Procedure: Left shoulder arthroscopic lysis of adhesions and manipulation under anesthesia;  Surgeon: Lyndle Herrlich, MD;  Location: [facility];  Service: Orthopedics;  Laterality: Left;   SHOULDER SURGERY  09/2008, 01/2009   Right shoulder surgery, then redone with lysis of adhesions   TOTAL HIP ARTHROPLASTY Right 05/29/2018   Procedure: TOTAL HIP ARTHROPLASTY ANTERIOR APPROACH;  Surgeon: Lyndle Herrlich, MD;  Location: [facility];  Service: Orthopedics;  Laterality: Right;   TOTAL HIP ARTHROPLASTY Left 01/2023  UPPER GI ENDOSCOPY      Current Outpatient Medications  Medication Sig Dispense Refill   ALPRAZolam (XANAX) 0.25 MG tablet Take 1 tablet (0.25 mg total) by mouth 2 (two) times daily as needed for anxiety. 20 tablet 0   aspirin-acetaminophen-caffeine (EXCEDRIN MIGRAINE) 250-250-65 MG tablet Take 1 tablet by mouth every 6 (six) hours as needed for headache.     azelastine (ASTELIN) 0.1 % nasal spray Place 1 spray into both nostrils 2 (two) times daily. Use in each nostril as directed 30 mL 12   Blood Glucose Monitoring Suppl (FIFTY50 GLUCOSE METER 2.0) w/Device KIT      Blood Glucose Monitoring Suppl (ONETOUCH VERIO) w/Device KIT Use as advised 1 kit 1   cetirizine (ZYRTEC) 10 MG tablet Take 10 mg by mouth daily.      clobetasol (TEMOVATE) 0.05 % external solution MIX IN JAR OF CERAVE CREAM, THEN APPLY TO BACK TWICE DAILY UNTIL ITCH IMPROVED - AVOID FACE, GROIN, AND AXILLA 50 mL 1   Continuous Glucose Sensor (DEXCOM G6 SENSOR) MISC CHANGE 1 SENSOR EVERY 10 DAYS AS DIRECTED 9 each 3   Continuous Glucose Transmitter (DEXCOM G6 TRANSMITTER) MISC USE 1 TRANSMITTER EVERY 3 MONTHS 1  each 3   docusate sodium (COLACE) 100 MG capsule Take 300 mg by mouth at bedtime.      DULoxetine (CYMBALTA) 60 MG capsule Take 1 capsule (60 mg total) by mouth daily. 90 capsule 3   Fluocinolone Acetonide 0.01 % OIL Apply 1-2 gtts to ears once to twice daily as needed until improved. 20 mL 2   fluticasone (FLONASE) 50 MCG/ACT nasal spray Place 1 spray into both nostrils daily.     furosemide (LASIX) 20 MG tablet TAKE 1 TABLET BY MOUTH DAILY 90 tablet 3   gabapentin (NEURONTIN) 100 MG capsule Take 100mg  twice a day and 2 at bedtime 360 capsule 3   Glucagon 3 MG/DOSE POWD Place 3 mg into the nose once as needed for up to 1 dose. 1 each 11   glucose blood (ONETOUCH ULTRA) test strip Use as instructed 2x a day 100 strip 11   HUMULIN R 500 UNIT/ML injection INJECT SUBCUTANEOUSLY UP TO 200  UNITS IN THE INSULIN PUMP DAILY 60 mL 3   HYDROcodone-acetaminophen (NORCO/VICODIN) 5-325 MG tablet Take 1 tablet by mouth every 4 (four) hours as needed for moderate pain (pain score 4-6). 60 tablet 0   Insulin Disposable Pump (OMNIPOD 5 DEXG7G6 INTRO GEN 5) KIT USE AS DIRECTED 1 kit 0   Insulin Disposable Pump (OMNIPOD 5 DEXG7G6 PODS GEN 5) MISC USE 1 POD EVERY OTHER DAY 45 each 3   JARDIANCE 10 MG TABS tablet TAKE 1 TABLET BY MOUTH DAILY  BEFORE BREAKFAST 90 tablet 3   LINZESS 72 MCG capsule Take 72 mcg by mouth daily.     LUMIGAN 0.01 % SOLN Place 1 drop into both eyes at bedtime.      metoprolol succinate (TOPROL-XL) 25 MG 24 hr tablet TAKE 1 AND 1/2 TABLETS BY MOUTH  DAILY 135 tablet 3   mometasone (ELOCON) 0.1 % cream Apply 1 Application topically daily.     ondansetron (ZOFRAN-ODT) 4 MG disintegrating tablet DISSOLVE 1 TABLET ON THE TONGUE  EVERY 6 HOURS AS NEEDED FOR  NAUSEA AND VOMITING 180 tablet 0   OneTouch Delica Lancets 33G MISC Use 2x a day 100 each 11   potassium chloride SA (KLOR-CON M) 20 MEQ tablet Take 1 tablet (20 mEq total) by mouth daily. 30 tablet 11  prochlorperazine (COMPAZINE) 10 MG  tablet Take 1 tablet (10 mg total) by mouth every 6 (six) hours as needed. 12 tablet 0   RABEprazole (ACIPHEX) 20 MG tablet Take 1 tablet (20 mg total) by mouth daily. 90 tablet 3   rosuvastatin (CRESTOR) 10 MG tablet Take 1 tablet (10 mg total) by mouth daily. (Patient taking differently: Take 20 mg by mouth daily.) 90 tablet 3   SKYRIZI PEN 150 MG/ML pen Inject into the skin.     traZODone (DESYREL) 100 MG tablet TAKE 2 TABLETS BY MOUTH EVERY  NIGHT AT BEDTIME 180 tablet 3   triamterene-hydrochlorothiazide (MAXZIDE-25) 37.5-25 MG tablet TAKE 1 TABLET BY MOUTH ONCE  DAILY 90 tablet 3   valACYclovir (VALTREX) 1000 MG tablet TAKE 2 TABLETS BY MOUTH AT ONSET OF SYMPTOMS AS NEEDED FOR FEVER  BLISTERS AND TAKE 2 TABLETS BY  MOUTH 12 HOURS LATER 90 tablet 3   No current facility-administered medications for this visit.    Allergies as of 12/06/2023 - Review Complete 11/06/2023  Allergen Reaction Noted   Humalog [insulin lispro] Swelling 03/16/2021   Lisinopril Cough 02/19/2008   Nitrofurantoin Other (See Comments) 02/23/2009   Oxycodone-acetaminophen Itching 02/23/2009   Percocet [oxycodone-acetaminophen]  09/15/2021   Atorvastatin Diarrhea 11/19/2015   Clarithromycin Palpitations    Glipizide Other (See Comments) 04/20/2017   Invokana [canagliflozin]  01/29/2019    Vitals: There were no vitals taken for this visit. Last Weight:  Wt Readings from Last 1 Encounters:  11/06/23 175 lb 12.8 oz (79.7 kg)   Last Height:   Ht Readings from Last 1 Encounters:  11/06/23 5\' 4"  (1.626 m)     Physical exam: Exam: Gen: NAD, conversant, well nourised, obese, well groomed                     CV: RRR, no MRG. No Carotid Bruits. No peripheral edema, warm, nontender Eyes: Conjunctivae clear without exudates or hemorrhage  Neuro: Detailed Neurologic Exam  Speech:    Speech is normal; fluent and spontaneous with normal comprehension.  Cognition:    The patient is oriented to person, place, and  time;     recent and remote memory intact;     language fluent;     normal attention, concentration,     fund of knowledge Cranial Nerves:    The pupils are equal, round, and reactive to light. The fundi are normal and spontaneous venous pulsations are present. Visual fields are full to finger confrontation. Extraocular movements are intact. Trigeminal sensation is intact and the muscles of mastication are normal. The face is symmetric. The palate elevates in the midline. Hearing intact. Voice is normal. Shoulder shrug is normal. The tongue has normal motion without fasciculations.   Coordination:    Normal finger to nose and heel to shin. Normal rapid alternating movements.   Gait:    Heel-toe and tandem gait are normal.   Motor Observation:    No asymmetry, no atrophy, and no involuntary movements noted. Tone:    Normal muscle tone.    Posture:    Posture is normal. normal erect    Strength:    Strength is V/V in the upper and lower limbs.      Sensation: intact to LT     Reflex Exam:  DTR's:    Deep tendon reflexes in the upper and lower extremities are normal bilaterally.   Toes:    The toes are downgoing bilaterally.   Clonus:    Clonus  is absent.    Assessment/Plan:    No orders of the defined types were placed in this encounter.  No orders of the defined types were placed in this encounter.   Cc: Karie Schwalbe, MD,  Karie Schwalbe, MD  Naomie Dean, MD  [facility] [address] [place], [place] [address]  Phone [phone] Fax [phone]

## 2023-12-26 ENCOUNTER — Other Ambulatory Visit: Payer: Self-pay | Admitting: Internal Medicine

## 2023-12-26 DIAGNOSIS — R000 Tachycardia, unspecified: Secondary | ICD-10-CM

## 2024-01-05 ENCOUNTER — Other Ambulatory Visit: Payer: Self-pay | Admitting: Internal Medicine

## 2024-01-10 ENCOUNTER — Encounter: Payer: Self-pay | Admitting: Internal Medicine

## 2024-01-10 ENCOUNTER — Ambulatory Visit: Payer: 59 | Admitting: Internal Medicine

## 2024-01-10 ENCOUNTER — Ambulatory Visit
Admission: RE | Admit: 2024-01-10 | Discharge: 2024-01-10 | Disposition: A | Discharge status: Home / Self Care | Payer: 59 | Source: Ambulatory Visit | Attending: Internal Medicine | Admitting: Internal Medicine

## 2024-01-10 VITALS — BP 122/70 | HR 97 | Temp 98.7°F | Ht 64.0 in | Wt 173.0 lb

## 2024-01-10 DIAGNOSIS — R042 Hemoptysis: Secondary | ICD-10-CM

## 2024-01-10 MED ORDER — AZITHROMYCIN 250 MG PO TABS: ORAL_TABLET | ORAL | 0 refills | Status: DC

## 2024-01-10 MED ORDER — HYDROCODONE-ACETAMINOPHEN 5-325 MG PO TABS: 1.0000 | ORAL_TABLET | ORAL | 0 refills | Status: DC | PRN

## 2024-01-10 MED ORDER — AMOXICILLIN-POT CLAVULANATE 875-125 MG PO TABS: 1.0000 | ORAL_TABLET | Freq: Two times a day (BID) | ORAL | 0 refills | Status: DC

## 2024-01-10 NOTE — Telephone Encounter (Signed)
 Called and spoke to pt. Made OV for 230 today.

## 2024-01-10 NOTE — Assessment & Plan Note (Addendum)
 Blood likely from the sinuses but will check CXR---just in case Some increased interstitial markings but not much change from August film read as normal Will give augmentin 875 bid x 7 days---but also a z-pak in case there is lower resp involvement Hydrocodone for the cough

## 2024-01-10 NOTE — Progress Notes (Signed)
 Subjective:    Patient ID: Shannon [name], female    DOB: Aug 27, 1956, 68 y.o.   MRN: [id number]  HPI Here due to respiratory illness  Sick for 8 days Exposed to grandkids with the flu--husband also sick Was in bed for 3 days--could barely eat or drink Now coughing up blood for the past 3 days (blood mixed with yellow green stuff--and some brown) Fever at first---with chills, sweats, shakes Headache and myalgias Several vomiting spells and loose stools Doesn't feel post nasal drip--not much from her nose Left frontal headache--and too occiput. Now having pain along left posterior teeth (but had normal x-rays a few months ago) Some SOB  Taking mucinex DM for cough, excedrin migraine for headaches (helped a little)  . Current Outpatient Medications on File Prior to Visit  Medication Sig Dispense Refill   ALPRAZolam (XANAX) 0.25 MG tablet Take 1 tablet (0.25 mg total) by mouth 2 (two) times daily as needed for anxiety. 20 tablet 0   aspirin-acetaminophen-caffeine (EXCEDRIN MIGRAINE) 250-250-65 MG tablet Take 1 tablet by mouth every 6 (six) hours as needed for headache.     azelastine (ASTELIN) 0.1 % nasal spray Place 1 spray into both nostrils 2 (two) times daily. Use in each nostril as directed 30 mL 12   Blood Glucose Monitoring Suppl (FIFTY50 GLUCOSE METER 2.0) w/Device KIT      Blood Glucose Monitoring Suppl (ONETOUCH VERIO) w/Device KIT Use as advised 1 kit 1   cetirizine (ZYRTEC) 10 MG tablet Take 10 mg by mouth daily.      clobetasol (TEMOVATE) 0.05 % external solution MIX IN JAR OF CERAVE CREAM, THEN APPLY TO BACK TWICE DAILY UNTIL ITCH IMPROVED - AVOID FACE, GROIN, AND AXILLA 50 mL 1   Continuous Glucose Sensor (DEXCOM G6 SENSOR) MISC CHANGE 1 SENSOR EVERY 10 DAYS AS DIRECTED 9 each 3   Continuous Glucose Transmitter (DEXCOM G6 TRANSMITTER) MISC USE 1 TRANSMITTER EVERY 3 MONTHS 1 each 3   docusate sodium (COLACE) 100 MG capsule Take 300 mg by mouth at bedtime.      DULoxetine  (CYMBALTA) 60 MG capsule TAKE 1 CAPSULE BY MOUTH DAILY 90 capsule 3   Fluocinolone Acetonide 0.01 % OIL Apply 1-2 gtts to ears once to twice daily as needed until improved. 20 mL 2   fluticasone (FLONASE) 50 MCG/ACT nasal spray Place 1 spray into both nostrils daily.     furosemide (LASIX) 20 MG tablet TAKE 1 TABLET BY MOUTH DAILY 90 tablet 3   gabapentin (NEURONTIN) 100 MG capsule Take 100mg  twice a day and 2 at bedtime 360 capsule 3   Glucagon 3 MG/DOSE POWD Place 3 mg into the nose once as needed for up to 1 dose. 1 each 11   glucose blood (ONETOUCH ULTRA) test strip Use as instructed 2x a day 100 strip 11   HUMULIN R 500 UNIT/ML injection INJECT SUBCUTANEOUSLY UP TO 200  UNITS IN THE INSULIN PUMP DAILY 60 mL 3   HYDROcodone-acetaminophen (NORCO/VICODIN) 5-325 MG tablet Take 1 tablet by mouth every 4 (four) hours as needed for moderate pain (pain score 4-6). 60 tablet 0   Insulin Disposable Pump (OMNIPOD 5 DEXG7G6 INTRO GEN 5) KIT USE AS DIRECTED 1 kit 0   Insulin Disposable Pump (OMNIPOD 5 DEXG7G6 PODS GEN 5) MISC USE 1 POD EVERY OTHER DAY 45 each 3   JARDIANCE 10 MG TABS tablet TAKE 1 TABLET BY MOUTH DAILY  BEFORE BREAKFAST 90 tablet 3   LINZESS 72 MCG capsule Take  72 mcg by mouth daily.     LUMIGAN 0.01 % SOLN Place 1 drop into both eyes at bedtime.      metoprolol succinate (TOPROL-XL) 25 MG 24 hr tablet TAKE 1 AND 1/2 TABLETS BY MOUTH  DAILY 135 tablet 3   mometasone (ELOCON) 0.1 % cream Apply 1 Application topically daily.     ondansetron (ZOFRAN-ODT) 4 MG disintegrating tablet DISSOLVE 1 TABLET ON THE TONGUE  EVERY 6 HOURS AS NEEDED FOR  NAUSEA AND VOMITING 180 tablet 0   OneTouch Delica Lancets 33G MISC Use 2x a day 100 each 11   potassium chloride SA (KLOR-CON M) 20 MEQ tablet Take 1 tablet (20 mEq total) by mouth daily. 30 tablet 11   prochlorperazine (COMPAZINE) 10 MG tablet Take 1 tablet (10 mg total) by mouth every 6 (six) hours as needed. 12 tablet 0   RABEprazole (ACIPHEX) 20 MG  tablet Take 1 tablet (20 mg total) by mouth daily. 90 tablet 3   rosuvastatin (CRESTOR) 10 MG tablet Take 1 tablet (10 mg total) by mouth daily. (Patient taking differently: Take 20 mg by mouth daily.) 90 tablet 3   SKYRIZI PEN 150 MG/ML pen Inject into the skin.     traZODone (DESYREL) 100 MG tablet TAKE 2 TABLETS BY MOUTH EVERY  NIGHT AT BEDTIME 180 tablet 3   triamterene-hydrochlorothiazide (MAXZIDE-25) 37.5-25 MG tablet TAKE 1 TABLET BY MOUTH ONCE  DAILY 90 tablet 3   valACYclovir (VALTREX) 1000 MG tablet TAKE 2 TABLETS BY MOUTH AT ONSET OF SYMPTOMS AS NEEDED FOR FEVER  BLISTERS AND TAKE 2 TABLETS BY  MOUTH 12 HOURS LATER 90 tablet 3   No current facility-administered medications on file prior to visit.    Allergies  Allergen Reactions   Humalog [Insulin Lispro] Swelling   Lisinopril Cough   Nitrofurantoin Other (See Comments)    Pt unsure   Oxycodone-Acetaminophen Itching   Percocet [Oxycodone-Acetaminophen]    Atorvastatin Diarrhea    May have caused colitis--but not clear cut   Clarithromycin Palpitations   Glipizide Other (See Comments)    Sugars too variable   Invokana [Canagliflozin]     Nausea, constipation    Past Medical History:  Diagnosis Date   Actinic keratosis    Allergy    Arthritis    hip, back, shoulders, joints   Asthma    Complication of anesthesia    problems with block during breast augmentation and epidural during childbirth   Depression    Diabetes mellitus 09/2011   type 2   Family history of adverse reaction to anesthesia    PROBLEMS WITH BLOCKS   Fatty liver    GERD (gastroesophageal reflux disease)    Glaucoma suspect    Headache    migraines-4x a week headache-1x week migraine   History of hiatal hernia    Hyperlipidemia    Hypertension    Controlled on meds   Mitral valve prolapse    Nose colonized with MRSA 05/15/2018   Osteopenia    Pneumonia    PONV (postoperative nausea and vomiting)    Psoriasis    Shingles 11/2006   Sleep  disturbance    Tachycardia    Vertigo    Hx of    Past Surgical History:  Procedure Laterality Date   ABDOMINAL HYSTERECTOMY  2000   /BSO - abn. Paps   APPENDECTOMY  1978   AUGMENTATION MAMMAPLASTY Bilateral 1981   BLADDER REPAIR     x2   BREAST ENHANCEMENT SURGERY  1987   CARDIAC CATHETERIZATION     [facility]   CARDIAC CATHETERIZATION Left 12/18/2016   Procedure: Left Heart Cath and Coronary Angiography;  Surgeon: Iran Ouch, MD;  Location: [facility];  Service: Cardiovascular;  Laterality: Left;   CARPAL TUNNEL RELEASE Bilateral    CERVICAL DISCECTOMY  11/2009   with synthetic discs inserted--  Dr Lovell Sheehan   COLONOSCOPY     JOINT REPLACEMENT     Left Hip Replacement Left 02/01/2023   NM MYOVIEW LTD  03/2005   EF 56%, no ischemia   RECTAL PROLAPSE REPAIR     x1   SHOULDER ARTHROSCOPY WITH OPEN ROTATOR CUFF REPAIR Left 12/29/2019   Procedure: LEFT SHOULDER ARTHROSCOPIC DISTAL CLAVICLE EXCISISON, SUBACROMIAL DECOMPRESSION,BICEPS TENOTOMY,ROTATOR CUFF REPAIR ;  Surgeon: Lyndle Herrlich, MD;  Location: [facility];  Service: Orthopedics;  Laterality: Left;   SHOULDER CLOSED REDUCTION Left 05/03/2020   Procedure: Left shoulder arthroscopic lysis of adhesions and manipulation under anesthesia;  Surgeon: Lyndle Herrlich, MD;  Location: [facility];  Service: Orthopedics;  Laterality: Left;   SHOULDER SURGERY  09/2008, 01/2009   Right shoulder surgery, then redone with lysis of adhesions   TOTAL HIP ARTHROPLASTY Right 05/29/2018   Procedure: TOTAL HIP ARTHROPLASTY ANTERIOR APPROACH;  Surgeon: Lyndle Herrlich, MD;  Location: [facility];  Service: Orthopedics;  Laterality: Right;   TOTAL HIP ARTHROPLASTY Left 01/2023   UPPER GI ENDOSCOPY      Family History  Problem Relation Age of Onset   Diabetes Mother    Heart disease Mother    Hypertension Mother    Ovarian cancer Mother    Melanoma Mother    Cancer Father    Coronary artery disease Father    Heart disease Father     Diabetes Sister    Depression Sister    Coronary artery disease Sister    Heart disease Sister    Pulmonary Hypertension Sister    Rheum arthritis Maternal Aunt    Heart attack Paternal Uncle    Heart attack Paternal Uncle    Breast cancer Maternal Grandmother    Diabetes Maternal Grandmother    Diabetes Maternal Grandfather    Heart attack Cousin    Colon cancer Neg Hx     Social History   Socioeconomic History   Marital status: Married    Spouse name: Not on file   Number of children: 5   Years of education: Not on file   Highest education level: Not on file  Occupational History   Occupation: homemaker  Tobacco Use   Smoking status: Never    Passive exposure: Past   Smokeless tobacco: Never  Vaping Use   Vaping status: Never Used  Substance and Sexual Activity   Alcohol use: No   Drug use: No   Sexual activity: Not on file  Other Topics Concern   Not on file  Social History Narrative   Home maker   Social Drivers of Health   Financial Resource Strain: Not on file  Food Insecurity: Not on file  Transportation Needs: Not on file  Physical Activity: Not on file  Stress: Not on file  Social Connections: Not on file  Intimate Partner Violence: Not on file   Review of Systems Has sensitive smell and everything tastes terrible Not eating much    Objective:   Physical Exam Constitutional:      Comments: Looks washed out but no distress  HENT:     Head:  Comments: No sinus tenderness    Right Ear: Tympanic membrane and ear canal normal.     Left Ear: Tympanic membrane and ear canal normal.     Mouth/Throat:     Pharynx: No oropharyngeal exudate or posterior oropharyngeal erythema.     Comments: Right upper molar sensitive but doesn't look inflamed Pulmonary:     Effort: Pulmonary effort is normal.     Breath sounds: Normal breath sounds. No wheezing or rales.  Musculoskeletal:     Cervical back: Neck supple.  Lymphadenopathy:     Cervical: No  cervical adenopathy.  Neurological:     Mental Status: She is alert.            Assessment & Plan:

## 2024-01-21 ENCOUNTER — Encounter: Payer: Self-pay | Admitting: Internal Medicine

## 2024-02-18 ENCOUNTER — Encounter: Payer: Self-pay | Admitting: Internal Medicine

## 2024-02-18 MED ORDER — PANTOPRAZOLE SODIUM 40 MG PO TBEC: 40.0000 mg | DELAYED_RELEASE_TABLET | Freq: Two times a day (BID) | ORAL | 3 refills | Status: DC

## 2024-02-18 MED ORDER — POTASSIUM CHLORIDE CRYS ER 20 MEQ PO TBCR: 20.0000 meq | EXTENDED_RELEASE_TABLET | Freq: Every day | ORAL | 3 refills | Status: DC

## 2024-02-21 ENCOUNTER — Ambulatory Visit: Payer: 59 | Admitting: Internal Medicine

## 2024-03-11 ENCOUNTER — Ambulatory Visit: Payer: 59 | Admitting: Internal Medicine

## 2024-03-11 ENCOUNTER — Encounter: Payer: Self-pay | Admitting: Internal Medicine

## 2024-03-11 VITALS — BP 122/68 | HR 91 | Temp 98.7°F | Ht 64.0 in | Wt 174.0 lb

## 2024-03-11 DIAGNOSIS — Z7984 Long term (current) use of oral hypoglycemic drugs: Secondary | ICD-10-CM

## 2024-03-11 DIAGNOSIS — I10 Essential (primary) hypertension: Secondary | ICD-10-CM | POA: Diagnosis not present

## 2024-03-11 DIAGNOSIS — Z794 Long term (current) use of insulin: Secondary | ICD-10-CM

## 2024-03-11 DIAGNOSIS — F39 Unspecified mood [affective] disorder: Secondary | ICD-10-CM

## 2024-03-11 DIAGNOSIS — E1149 Type 2 diabetes mellitus with other diabetic neurological complication: Secondary | ICD-10-CM

## 2024-03-11 DIAGNOSIS — L4050 Arthropathic psoriasis, unspecified: Secondary | ICD-10-CM

## 2024-03-11 DIAGNOSIS — Z0001 Encounter for general adult medical examination with abnormal findings: Secondary | ICD-10-CM | POA: Diagnosis not present

## 2024-03-11 DIAGNOSIS — Z0000 Encounter for general adult medical examination without abnormal findings: Secondary | ICD-10-CM

## 2024-03-11 LAB — CBC
HCT: 41.9 % (ref 36.0–46.0)
Hemoglobin: 13.5 g/dL (ref 12.0–15.0)
MCHC: 32.3 g/dL (ref 30.0–36.0)
MCV: 87.5 fl (ref 78.0–100.0)
Platelets: 294 10*3/uL (ref 150.0–400.0)
RBC: 4.79 Mil/uL (ref 3.87–5.11)
RDW: 19.2 % — ABNORMAL HIGH (ref 11.5–15.5)
WBC: 9.6 10*3/uL (ref 4.0–10.5)

## 2024-03-11 LAB — HEPATIC FUNCTION PANEL
ALT: 23 U/L (ref 0–35)
AST: 21 U/L (ref 0–37)
Albumin: 4.4 g/dL (ref 3.5–5.2)
Alkaline Phosphatase: 88 U/L (ref 39–117)
Bilirubin, Direct: 0.1 mg/dL (ref 0.0–0.3)
Total Bilirubin: 0.5 mg/dL (ref 0.2–1.2)
Total Protein: 6.9 g/dL (ref 6.0–8.3)

## 2024-03-11 LAB — LIPID PANEL
Cholesterol: 157 mg/dL (ref 0–200)
HDL: 61.3 mg/dL (ref >39.00)
LDL Cholesterol: 67 mg/dL (ref 0–99)
NonHDL: 95.83
Total CHOL/HDL Ratio: 3
Triglycerides: 142 mg/dL (ref 0.0–149.0)
VLDL: 28.4 mg/dL (ref 0.0–40.0)

## 2024-03-11 LAB — RENAL FUNCTION PANEL
Albumin: 4.4 g/dL (ref 3.5–5.2)
BUN: 15 mg/dL (ref 6–23)
CO2: 33 meq/L — ABNORMAL HIGH (ref 19–32)
Calcium: 9.8 mg/dL (ref 8.4–10.5)
Chloride: 99 meq/L (ref 96–112)
Creatinine, Ser: 0.87 mg/dL (ref 0.40–1.20)
GFR: 69.59 mL/min (ref >60.00)
Glucose, Bld: 103 mg/dL — ABNORMAL HIGH (ref 70–99)
Phosphorus: 2.9 mg/dL (ref 2.3–4.6)
Potassium: 4.1 meq/L (ref 3.5–5.1)
Sodium: 139 meq/L (ref 135–145)

## 2024-03-11 LAB — TSH: TSH: 0.7 u[IU]/mL (ref 0.35–5.50)

## 2024-03-11 LAB — MICROALBUMIN / CREATININE URINE RATIO
Creatinine,U: 122.5 mg/dL
Microalb Creat Ratio: 20.6 mg/g (ref 0.0–30.0)
Microalb, Ur: 2.5 mg/dL — ABNORMAL HIGH (ref 0.0–1.9)

## 2024-03-11 LAB — HEMOGLOBIN A1C: Hgb A1c MFr Bld: 8.1 % — ABNORMAL HIGH (ref 4.6–6.5)

## 2024-03-11 LAB — VITAMIN B12: Vitamin B-12: 601 pg/mL (ref 211–911)

## 2024-03-11 MED ORDER — HYDROCODONE-ACETAMINOPHEN 5-325 MG PO TABS: 1.0000 | ORAL_TABLET | ORAL | 0 refills | Status: DC | PRN

## 2024-03-11 NOTE — Assessment & Plan Note (Signed)
 Still has pain despite skyrizi and methotrexate--will refer for second opinion

## 2024-03-11 NOTE — Assessment & Plan Note (Signed)
 BP Readings from Last 3 Encounters:  03/11/24 122/68  01/10/24 122/70  11/06/23 120/60   Good control

## 2024-03-11 NOTE — Progress Notes (Signed)
 Subjective:    Patient ID: Shannon [name], female    DOB: Nov 11, 1956, 68 y.o.   MRN: [id number]  HPI Here for physical  Still with lots of grief--- Prefers to just stay home and stays away from other people No energy--has to push to get things done Has lost mom, dad and lots of other family members over time Recent worsening---having to go through [employer] house, etc Can't lose weight--"feel off" Has lost libido also  Ongoing pain with psoriatic arthritis Wants to look for second opinion Methotrexate and skyrizi--but pain still bad  Diabetes is okay Goes back to Dr Aldona Amel next week  Current Outpatient Medications on File Prior to Visit  Medication Sig Dispense Refill   ALPRAZolam  (XANAX ) 0.25 MG tablet Take 1 tablet (0.25 mg total) by mouth 2 (two) times daily as needed for anxiety. 20 tablet 0   aspirin -acetaminophen -caffeine  (EXCEDRIN  MIGRAINE) 250-250-65 MG tablet Take 1 tablet by mouth every 6 (six) hours as needed for headache.     Blood Glucose Monitoring Suppl (FIFTY50 GLUCOSE METER 2.0) w/Device KIT      Blood Glucose Monitoring Suppl (ONETOUCH VERIO) w/Device KIT Use as advised 1 kit 1   cetirizine (ZYRTEC) 10 MG tablet Take 10 mg by mouth daily.      clobetasol  (TEMOVATE ) 0.05 % external solution MIX IN JAR OF CERAVE CREAM, THEN APPLY TO BACK TWICE DAILY UNTIL ITCH IMPROVED - AVOID FACE, GROIN, AND AXILLA 50 mL 1   Continuous Glucose Sensor (DEXCOM G6 SENSOR) MISC CHANGE 1 SENSOR EVERY 10 DAYS AS DIRECTED 9 each 3   Continuous Glucose Transmitter (DEXCOM G6 TRANSMITTER) MISC USE 1 TRANSMITTER EVERY 3 MONTHS 1 each 3   docusate sodium  (COLACE) 100 MG capsule Take 300 mg by mouth at bedtime.      DULoxetine  (CYMBALTA ) 60 MG capsule TAKE 1 CAPSULE BY MOUTH DAILY 90 capsule 3   Fluocinolone  Acetonide 0.01 % OIL Apply 1-2 gtts to ears once to twice daily as needed until improved. 20 mL 2   fluticasone  (FLONASE ) 50 MCG/ACT nasal spray Place 1 spray into both nostrils daily.      furosemide  (LASIX ) 20 MG tablet TAKE 1 TABLET BY MOUTH DAILY 90 tablet 3   gabapentin  (NEURONTIN ) 100 MG capsule Take 100mg  twice a day and 2 at bedtime 360 capsule 3   Glucagon  3 MG/DOSE POWD Place 3 mg into the nose once as needed for up to 1 dose. 1 each 11   glucose blood (ONETOUCH ULTRA) test strip Use as instructed 2x a day 100 strip 11   HUMULIN  R 500 UNIT/ML injection INJECT SUBCUTANEOUSLY UP TO 200  UNITS IN THE INSULIN  PUMP DAILY 60 mL 3   HYDROcodone -acetaminophen  (NORCO/VICODIN) 5-325 MG tablet Take 1 tablet by mouth every 4 (four) hours as needed for moderate pain (pain score 4-6). 60 tablet 0   Insulin  Disposable Pump (OMNIPOD 5 DEXG7G6 INTRO GEN 5) KIT USE AS DIRECTED 1 kit 0   Insulin  Disposable Pump (OMNIPOD 5 DEXG7G6 PODS GEN 5) MISC USE 1 POD EVERY OTHER DAY 45 each 3   JARDIANCE  10 MG TABS tablet TAKE 1 TABLET BY MOUTH DAILY  BEFORE BREAKFAST 90 tablet 3   LUMIGAN 0.01 % SOLN Place 1 drop into both eyes at bedtime.      metoprolol  succinate (TOPROL -XL) 25 MG 24 hr tablet TAKE 1 AND 1/2 TABLETS BY MOUTH  DAILY 135 tablet 3   mometasone  (ELOCON ) 0.1 % cream Apply 1 Application topically daily.  ondansetron  (ZOFRAN -ODT) 4 MG disintegrating tablet DISSOLVE 1 TABLET ON THE TONGUE  EVERY 6 HOURS AS NEEDED FOR  NAUSEA AND VOMITING 180 tablet 0   OneTouch Delica Lancets 33G MISC Use 2x a day 100 each 11   pantoprazole  (PROTONIX ) 40 MG tablet Take 1 tablet (40 mg total) by mouth 2 (two) times daily. 180 tablet 3   potassium chloride  SA (KLOR-CON  M) 20 MEQ tablet Take 1 tablet (20 mEq total) by mouth daily. 90 tablet 3   prochlorperazine  (COMPAZINE ) 10 MG tablet Take 1 tablet (10 mg total) by mouth every 6 (six) hours as needed. 12 tablet 0   rosuvastatin  (CRESTOR ) 10 MG tablet Take 1 tablet (10 mg total) by mouth daily. (Patient taking differently: Take 20 mg by mouth daily.) 90 tablet 3   SKYRIZI PEN 150 MG/ML pen Inject into the skin.     traZODone  (DESYREL ) 100 MG tablet TAKE 2  TABLETS BY MOUTH EVERY  NIGHT AT BEDTIME 180 tablet 3   triamterene -hydrochlorothiazide  (MAXZIDE -25) 37.5-25 MG tablet TAKE 1 TABLET BY MOUTH ONCE  DAILY 90 tablet 3   valACYclovir  (VALTREX ) 1000 MG tablet TAKE 2 TABLETS BY MOUTH AT ONSET OF SYMPTOMS AS NEEDED FOR FEVER  BLISTERS AND TAKE 2 TABLETS BY  MOUTH 12 HOURS LATER 90 tablet 3   No current facility-administered medications on file prior to visit.    Allergies  Allergen Reactions   Humalog [Insulin  Lispro] Swelling   Lisinopril Cough   Nitrofurantoin Other (See Comments)    Pt unsure   Oxycodone -Acetaminophen  Itching   Percocet [Oxycodone -Acetaminophen ]    Atorvastatin  Diarrhea    May have caused colitis--but not clear cut   Clarithromycin Nausea Only   Glipizide  Other (See Comments)    Sugars too variable   Invokana  [Canagliflozin ]     Nausea, constipation    Past Medical History:  Diagnosis Date   Actinic keratosis    Allergy    Arthritis    hip, back, shoulders, joints   Asthma    Complication of anesthesia    problems with block during breast augmentation and epidural during childbirth   Depression    Diabetes mellitus 09/2011   type 2   Family history of adverse reaction to anesthesia    PROBLEMS WITH BLOCKS   Fatty liver    GERD (gastroesophageal reflux disease)    Glaucoma suspect    Headache    migraines-4x a week headache-1x week migraine   History of hiatal hernia    Hyperlipidemia    Hypertension    Controlled on meds   Mitral valve prolapse    Nose colonized with MRSA 05/15/2018   Osteopenia    Pneumonia    PONV (postoperative nausea and vomiting)    Psoriasis    Shingles 11/2006   Sleep disturbance    Tachycardia    Vertigo    Hx of    Past Surgical History:  Procedure Laterality Date   ABDOMINAL HYSTERECTOMY  2000   /BSO - abn. Paps   APPENDECTOMY  1978   AUGMENTATION MAMMAPLASTY Bilateral 1981   BLADDER REPAIR     x2   BREAST ENHANCEMENT SURGERY  1987   CARDIAC CATHETERIZATION      [facility] [place]   CARDIAC CATHETERIZATION Left 12/18/2016   Procedure: Left Heart Cath and Coronary Angiography;  Surgeon: Wenona Hamilton, MD;  Location: [facility];  Service: Cardiovascular;  Laterality: Left;   CARPAL TUNNEL RELEASE Bilateral    CERVICAL DISCECTOMY  11/2009  with synthetic discs inserted--  Dr Larrie Po   COLONOSCOPY     JOINT REPLACEMENT     Left Hip Replacement Left 02/01/2023   NM MYOVIEW  LTD  03/2005   EF 56%, no ischemia   RECTAL PROLAPSE REPAIR     x1   SHOULDER ARTHROSCOPY WITH OPEN ROTATOR CUFF REPAIR Left 12/29/2019   Procedure: LEFT SHOULDER ARTHROSCOPIC DISTAL CLAVICLE EXCISISON, SUBACROMIAL DECOMPRESSION,BICEPS TENOTOMY,ROTATOR CUFF REPAIR ;  Surgeon: Jerlyn Moons, MD;  Location: [facility] OR[facility];  Service: Orthopedics;  Laterality: Left;   SHOULDER CLOSED REDUCTION Left 05/03/2020   Procedure: Left shoulder arthroscopic lysis of adhesions and manipulation under anesthesia;  Surgeon: Jerlyn Moons, MD;  Location: [facility];  Service: Orthopedics;  Laterality: Left;   SHOULDER SURGERY  09/2008, 01/2009   Right shoulder surgery, then redone with lysis of adhesions   TOTAL HIP ARTHROPLASTY Right 05/29/2018   Procedure: TOTAL HIP ARTHROPLASTY ANTERIOR APPROACH;  Surgeon: Jerlyn Moons, MD;  Location: [facility];  Service: Orthopedics;  Laterality: Right;   TOTAL HIP ARTHROPLASTY Left 01/2023   UPPER GI ENDOSCOPY      Family History  Problem Relation Age of Onset   Diabetes Mother    Heart disease Mother    Hypertension Mother    Ovarian cancer Mother    Melanoma Mother    Cancer Father    Coronary artery disease Father    Heart disease Father    Diabetes Sister    Depression Sister    Coronary artery disease Sister    Heart disease Sister    Pulmonary Hypertension Sister    Rheum arthritis Maternal Aunt    Heart attack Paternal Uncle    Heart attack Paternal Uncle    Breast cancer Maternal Grandmother    Diabetes Maternal Grandmother    Diabetes  Maternal Grandfather    Heart attack Cousin    Colon cancer Neg Hx     Social History   Socioeconomic History   Marital status: Married    Spouse name: Not on file   Number of children: 5   Years of education: Not on file   Highest education level: Not on file  Occupational History   Occupation: homemaker  Tobacco Use   Smoking status: Never    Passive exposure: Past   Smokeless tobacco: Never  Vaping Use   Vaping status: Never Used  Substance and Sexual Activity   Alcohol use: No   Drug use: No   Sexual activity: Not on file  Other Topics Concern   Not on file  Social History Narrative   Home maker   Social Drivers of Health   Financial Resource Strain: Not on file  Food Insecurity: Not on file  Transportation Needs: Not on file  Physical Activity: Not on file  Stress: Not on file  Social Connections: Not on file  Intimate Partner Violence: Not on file   Review of Systems  Constitutional:  Positive for fatigue. Negative for unexpected weight change.       Wears seat belt   HENT:  Positive for tinnitus. Negative for dental problem and hearing loss.   Eyes:        No diplopia or unilateral vision loss Severe dry eye though  Respiratory:  Negative for cough, chest tightness and shortness of breath.   Cardiovascular:  Positive for palpitations. Negative for chest pain and leg swelling.  Gastrointestinal:  Negative for blood in stool and constipation.       No heartburn  Endocrine: Positive for polydipsia and polyuria.  Genitourinary:  Positive for dyspareunia and frequency. Negative for dysuria and hematuria.       Uses lubricant  Musculoskeletal:  Positive for arthralgias, back pain and joint swelling.       Does need the hydrocodone  fairly regularly ---once a day generally Right shoulder torn rotator cuff --trying to avoid surgery  Skin:        Some psoriasis break outs---those mostly controlled No suspicious skin lesions  Allergic/Immunologic: Positive  for environmental allergies. Negative for immunocompromised state.       Zyrtec year round Fluticasone  for season  Neurological:  Positive for dizziness and headaches. Negative for syncope.       Some vertigo--like with turning head  Hematological:  Negative for adenopathy. Does not bruise/bleed easily.  Psychiatric/Behavioral:  Positive for dysphoric mood. Negative for sleep disturbance. The patient is nervous/anxious.        Objective:   Physical Exam Constitutional:      Appearance: Normal appearance.  HENT:     Mouth/Throat:     Pharynx: No oropharyngeal exudate or posterior oropharyngeal erythema.  Eyes:     Conjunctiva/sclera: Conjunctivae normal.     Pupils: Pupils are equal, round, and reactive to light.  Cardiovascular:     Rate and Rhythm: Normal rate and regular rhythm.     Pulses: Normal pulses.     Heart sounds: No murmur heard.    No gallop.  Pulmonary:     Effort: Pulmonary effort is normal.     Breath sounds: Normal breath sounds. No wheezing or rales.  Abdominal:     Palpations: Abdomen is soft.     Tenderness: There is no abdominal tenderness.  Musculoskeletal:     Cervical back: Neck supple.     Right lower leg: No edema.     Left lower leg: No edema.  Lymphadenopathy:     Cervical: No cervical adenopathy.  Skin:    Findings: No rash.     Comments: No foot lesions  Neurological:     General: No focal deficit present.     Mental Status: She is alert and oriented to person, place, and time.     Comments: Normal sensation in feet  Psychiatric:        Mood and Affect: Mood normal.        Behavior: Behavior normal.            Assessment & Plan:

## 2024-03-11 NOTE — Assessment & Plan Note (Signed)
 Ongoing grieving and facets of MDD Will continue meds but refer for counseling Check labs

## 2024-03-11 NOTE — Assessment & Plan Note (Signed)
 Multiple issues are dragging her down Discussed walking--and time out in the sun Flu vaccine in the fall--prefers no COVID Colonoscopy due by next year Mammogram yearly in June No pap--hyster

## 2024-03-11 NOTE — Assessment & Plan Note (Signed)
 Lab Results  Component Value Date   HGBA1C 7.4 (A) 11/06/2023   Will recheck--seeing Dr Aldona Amel soon

## 2024-03-12 ENCOUNTER — Encounter: Payer: Self-pay | Admitting: Internal Medicine

## 2024-03-15 NOTE — Progress Notes (Unsigned)
 Cardiology Office Note    Date:  03/17/2024   ID:  TIMOLIN LININGER, DOB Oct 18, 1956, MRN [id number]  PCP:  Helaine Llanos, MD  Cardiologist:  Sammy Crisp, MD  Electrophysiologist:  None   Chief Complaint: Follow-up  History of Present Illness:   Shannon [name] is a 68 y.o. female with history of mitral regurgitation, aortic atherosclerosis, HTN, HLD, DM2, and GERD who presents for follow-up of mitral regurgitation, aortic atherosclerosis, HTN, and HLD.  She was previously followed by Dr. Lina Render with evaluation in 2018 for nonexertional chest pain.  Echo at that time showed an EF of 55 to 60%, no regional wall motion abnormalities, mild LVH, grade 1 diastolic dysfunction, mitral valve with systolic bowing without prolapse, trivial mitral regurgitation, and normal RV systolic function.  Treadmill MPI in 11/2016 showed no significant ischemia with an EF of 66% and was overall low risk.  Due to persisting symptoms that she underwent LHC in 11/2016 that showed minimal luminal irregularities affecting the LAD with no evidence of obstructive CAD and normal LV systolic function with mildly elevated LVEDP.  Most recent echo from 03/2021 showed an EF of 50 to 55%, no regional wall motion abnormalities, grade 1 diastolic dysfunction, normal RV systolic function and ventricular cavity size, normal RVSP, mild mitral regurgitation, and an estimated right atrial pressure of 8 mmHg.  She was last seen in the office in 08/2023 noting brief and self-limited palpitations with no further workup indicated at that time.  Rosuvastatin  was titrated to 10 mg daily.  She comes in today noting several complaints including a several month history of exertional fatigue, exertional chest discomfort, and exertional dyspnea.  She indicates if she forgets to take her furosemide  her hands and feet become swollen.  No progressive orthopnea.  Her weight is up 4 pounds when compared to her visit in 08/2023.  No falls or symptoms  concerning for bleeding.  Longstanding palpitations are stable.  No near-syncope or syncope.   Labs independently reviewed: 02/2024 - Hgb 13.5, PLT 294, A1c 8.1, TC 157, TG 142, HDL 61, LDL 67, TSH normal, potassium 4.1, BUN 15, serum creatinine 0.87, albumin 4.4, AST/ALT normal  Past Medical History:  Diagnosis Date   Actinic keratosis    Allergy    Arthritis    hip, back, shoulders, joints   Asthma    Complication of anesthesia    problems with block during breast augmentation and epidural during childbirth   Depression    Diabetes mellitus 09/2011   type 2   Family history of adverse reaction to anesthesia    PROBLEMS WITH BLOCKS   Fatty liver    GERD (gastroesophageal reflux disease)    Glaucoma suspect    Headache    migraines-4x a week headache-1x week migraine   History of hiatal hernia    Hyperlipidemia    Hypertension    Controlled on meds   Mitral valve prolapse    Nose colonized with MRSA 05/15/2018   Osteopenia    Pneumonia    PONV (postoperative nausea and vomiting)    Psoriasis    Shingles 11/2006   Sleep disturbance    Tachycardia    Vertigo    Hx of    Past Surgical History:  Procedure Laterality Date   ABDOMINAL HYSTERECTOMY  2000   /BSO - abn. [facility]   APPENDECTOMY  1978   AUGMENTATION MAMMAPLASTY Bilateral 1981   BLADDER REPAIR     x2   BREAST ENHANCEMENT SURGERY  1987   CARDIAC CATHETERIZATION     [facility]   CARDIAC CATHETERIZATION Left 12/18/2016   Procedure: Left Heart Cath and Coronary Angiography;  Surgeon: Wenona Hamilton, MD;  Location: [facility] INVASIVE CV [facility];  Service: Cardiovascular;  Laterality: Left;   CARPAL TUNNEL RELEASE Bilateral    CERVICAL DISCECTOMY  11/2009   with synthetic discs inserted--  Dr Larrie Po   COLONOSCOPY     JOINT REPLACEMENT     Left Hip Replacement Left 02/01/2023   NM MYOVIEW  LTD  03/2005   EF 56%, no ischemia   RECTAL PROLAPSE REPAIR     x1   SHOULDER ARTHROSCOPY WITH OPEN ROTATOR CUFF REPAIR Left 12/29/2019    Procedure: LEFT SHOULDER ARTHROSCOPIC DISTAL CLAVICLE EXCISISON, SUBACROMIAL DECOMPRESSION,BICEPS TENOTOMY,ROTATOR CUFF REPAIR ;  Surgeon: Jerlyn Moons, MD;  Location: [facility] ORS;  Service: Orthopedics;  Laterality: Left;   SHOULDER CLOSED REDUCTION Left 05/03/2020   Procedure: Left shoulder arthroscopic lysis of adhesions and manipulation under anesthesia;  Surgeon: Jerlyn Moons, MD;  Location: [facility] ORS;  Service: Orthopedics;  Laterality: Left;   SHOULDER SURGERY  09/2008, 01/2009   Right shoulder surgery, then redone with lysis of adhesions   TOTAL HIP ARTHROPLASTY Right 05/29/2018   Procedure: TOTAL HIP ARTHROPLASTY ANTERIOR APPROACH;  Surgeon: Jerlyn Moons, MD;  Location: [facility] ORS;  Service: Orthopedics;  Laterality: Right;   TOTAL HIP ARTHROPLASTY Left 01/2023   UPPER GI ENDOSCOPY      Current Medications: Current Meds  Medication Sig   ALPRAZolam  (XANAX ) 0.25 MG tablet Take 1 tablet (0.25 mg total) by mouth 2 (two) times daily as needed for anxiety.   aspirin -acetaminophen -caffeine  (EXCEDRIN  MIGRAINE) 250-250-65 MG tablet Take 1 tablet by mouth every 6 (six) hours as needed for headache.   Blood Glucose Monitoring Suppl (FIFTY50 GLUCOSE METER 2.0) w/Device KIT    Blood Glucose Monitoring Suppl (ONETOUCH VERIO) w/Device KIT Use as advised   cetirizine (ZYRTEC) 10 MG tablet Take 10 mg by mouth daily.    clobetasol  (TEMOVATE ) 0.05 % external solution MIX IN JAR OF CERAVE CREAM, THEN APPLY TO BACK TWICE DAILY UNTIL ITCH IMPROVED - AVOID FACE, GROIN, AND AXILLA   Continuous Glucose Sensor (DEXCOM G6 SENSOR) MISC CHANGE 1 SENSOR EVERY 10 DAYS AS DIRECTED   Continuous Glucose Transmitter (DEXCOM G6 TRANSMITTER) MISC USE 1 TRANSMITTER EVERY 3 MONTHS   docusate sodium  (COLACE) 100 MG capsule Take 300 mg by mouth at bedtime.    DULoxetine  (CYMBALTA ) 60 MG capsule TAKE 1 CAPSULE BY MOUTH DAILY   Fluocinolone  Acetonide 0.01 % OIL Apply 1-2 gtts to ears once to twice daily as needed until  improved.   fluticasone  (FLONASE ) 50 MCG/ACT nasal spray Place 1 spray into both nostrils daily.   folic acid (FOLVITE) 1 MG tablet Take 1 mg by mouth daily.   furosemide  (LASIX ) 20 MG tablet TAKE 1 TABLET BY MOUTH DAILY   gabapentin  (NEURONTIN ) 100 MG capsule Take 100mg  twice a day and 2 at bedtime   Glucagon  3 MG/DOSE POWD Place 3 mg into the nose once as needed for up to 1 dose.   glucose blood (ONETOUCH ULTRA) test strip Use as instructed 2x a day   HUMULIN  R 500 UNIT/ML injection INJECT SUBCUTANEOUSLY UP TO 200  UNITS IN THE INSULIN  PUMP DAILY   HYDROcodone -acetaminophen  (NORCO/VICODIN) 5-325 MG tablet Take 1 tablet by mouth every 4 (four) hours as needed for moderate pain (pain score 4-6).   Insulin  Disposable Pump (OMNIPOD 5 DEXG7G6 INTRO GEN 5) KIT  USE AS DIRECTED   Insulin  Disposable Pump (OMNIPOD 5 DEXG7G6 PODS GEN 5) MISC USE 1 POD EVERY OTHER DAY   JARDIANCE  10 MG TABS tablet TAKE 1 TABLET BY MOUTH DAILY  BEFORE BREAKFAST   LUMIGAN 0.01 % SOLN Place 1 drop into both eyes at bedtime.    methocarbamol (ROBAXIN) 750 MG tablet Take 1 tablet 3 times a day by oral route as needed.   methotrexate (RHEUMATREX) 2.5 MG tablet Take by mouth.   metoprolol  succinate (TOPROL -XL) 25 MG 24 hr tablet TAKE 1 AND 1/2 TABLETS BY MOUTH  DAILY   metoprolol  tartrate (LOPRESSOR ) 50 MG tablet TAKE BOTH TABLETS ABOUT 2 HR PRIOR TO CARDIAC PROCEDURE   mometasone  (ELOCON ) 0.1 % cream Apply 1 Application topically daily.   ondansetron  (ZOFRAN -ODT) 4 MG disintegrating tablet DISSOLVE 1 TABLET ON THE TONGUE  EVERY 6 HOURS AS NEEDED FOR  NAUSEA AND VOMITING   OneTouch Delica Lancets 33G MISC Use 2x a day   pantoprazole  (PROTONIX ) 40 MG tablet Take 1 tablet (40 mg total) by mouth 2 (two) times daily.   potassium chloride  SA (KLOR-CON  M) 20 MEQ tablet Take 1 tablet (20 mEq total) by mouth daily.   prochlorperazine  (COMPAZINE ) 10 MG tablet Take 1 tablet (10 mg total) by mouth every 6 (six) hours as needed.    rosuvastatin  (CRESTOR ) 10 MG tablet Take 1 tablet (10 mg total) by mouth daily. (Patient taking differently: Take 20 mg by mouth daily.)   SKYRIZI PEN 150 MG/ML pen Inject into the skin.   traZODone  (DESYREL ) 100 MG tablet TAKE 2 TABLETS BY MOUTH EVERY  NIGHT AT BEDTIME   triamterene -hydrochlorothiazide  (MAXZIDE -25) 37.5-25 MG tablet TAKE 1 TABLET BY MOUTH ONCE  DAILY   valACYclovir  (VALTREX ) 1000 MG tablet TAKE 2 TABLETS BY MOUTH AT ONSET OF SYMPTOMS AS NEEDED FOR FEVER  BLISTERS AND TAKE 2 TABLETS BY  MOUTH 12 HOURS LATER    Allergies:   Humalog [insulin  lispro], Lisinopril, Nitrofurantoin, Oxycodone -acetaminophen , Percocet [oxycodone -acetaminophen ], Atorvastatin , Clarithromycin, Glipizide , and Invokana  [canagliflozin ]   Social History   Socioeconomic History   Marital status: Married    Spouse name: Not on file   Number of children: 5   Years of education: Not on file   Highest education level: Not on file  Occupational History   Occupation: homemaker  Tobacco Use   Smoking status: Never    Passive exposure: Past   Smokeless tobacco: Never  Vaping Use   Vaping status: Never Used  Substance and Sexual Activity   Alcohol use: No   Drug use: No   Sexual activity: Not on file  Other Topics Concern   Not on file  Social History Narrative   Home maker   Social Drivers of Health   Financial Resource Strain: Not on file  Food Insecurity: Not on file  Transportation Needs: Not on file  Physical Activity: Not on file  Stress: Not on file  Social Connections: Not on file     Family History:  The patient's family history includes Breast cancer in her maternal grandmother; Cancer in her father; Coronary artery disease in her father and sister; Depression in her sister; Diabetes in her maternal grandfather, maternal grandmother, mother, and sister; Heart attack in her cousin, paternal uncle, and paternal uncle; Heart disease in her father, mother, and sister; Hypertension in her  mother; Melanoma in her mother; Ovarian cancer in her mother; Pulmonary Hypertension in her sister; Rheum arthritis in her maternal aunt. There is no history of Colon cancer.  ROS:   12-point review of systems is negative unless otherwise noted in the HPI.   EKGs/Labs/Other Studies Reviewed:    Studies reviewed were summarized above. The additional studies were reviewed today:  2D echo 03/22/2021: 1. Left ventricular ejection fraction, by estimation, is 50 to 55%. The  left ventricle has low normal function. The left ventricle has no regional  wall motion abnormalities. Left ventricular diastolic parameters are  consistent with Grade I diastolic  dysfunction (impaired relaxation).   2. Right ventricular systolic function is normal. The right ventricular  size is normal. There is normal pulmonary artery systolic pressure.   3. The mitral valve is normal in structure. Mild mitral valve  regurgitation.   4. The aortic valve is tricuspid. Aortic valve regurgitation is not  visualized. No aortic stenosis is present.   5. The inferior vena cava is normal in size with <50% respiratory  variability, suggesting right atrial pressure of 8 mmHg.   Comparison(s): EF 55-60%, mild thickening of MV leaflets, no MVP seen only  bowing.  __________  [facility] 12/18/2016: The left ventricular systolic function is normal. LV end diastolic pressure is mildly elevated. The left ventricular ejection fraction is 55-65% by visual estimate.   1. Minimal luminal irregularities affecting the LAD. No evidence of obstructive coronary artery disease. 2. Normal LV systolic function. Mildly elevated left ventricular end-diastolic pressure.   Recommendations: The chest pain seems to be noncardiac. __________  Treadmill MPI 12/05/2016: Exercise myocardial perfusion imaging study with no significant ischemia Normal wall motion, EF estimated at 66% No EKG changes concerning for ischemia at peak stress or in  recovery. Target heart rate achieved Low risk scan __________  2D echo 11/29/2016: - Left ventricle: The cavity size was normal. Wall thickness was    increased in a pattern of mild LVH. Systolic function was normal.    The estimated ejection fraction was in the range of 55% to 60%.    Wall motion was normal; there were no regional wall motion    abnormalities. Doppler parameters are consistent with abnormal    left ventricular relaxation (grade 1 diastolic dysfunction).  - Mitral valve: Systolic bowing without prolapse. There was trivial    regurgitation.  - Right ventricle: The cavity size was normal. Wall thickness was    normal. Systolic function was normal.  __________  2D echo 02/06/2013: - Left ventricle: The cavity size was normal. Wall thickness    was normal. Systolic function was normal. The estimated    ejection fraction was in the range of 50% to 55%. Wall    motion was normal; there were no regional wall motion    abnormalities. Left ventricular diastolic function    parameters were normal.  - Mitral valve: Mild to moderate regurgitation.  - Right ventricle: Systolic function was normal.  - Tricuspid valve: Mild-moderate regurgitation.  - Pulmonary arteries: Systolic pressure is mildly elevated    consistent with mild pulmonary HTN. PA peak pressure: 35mm    Hg (S).     EKG:  EKG is ordered today.  The EKG ordered today demonstrates NSR, 87 bpm, no acute ST-T changes  Recent Labs: 03/11/2024: ALT 23; BUN 15; Creatinine, Ser 0.87; Hemoglobin 13.5; Platelets 294.0; Potassium 4.1; Sodium 139; TSH 0.70  Recent Lipid Panel    Component Value Date/Time   CHOL 157 03/11/2024 1214   CHOL 231 (H) 12/14/2016 1131   TRIG 142.0 03/11/2024 1214   HDL 61.30 03/11/2024 1214   HDL 59 12/14/2016 1131  CHOLHDL 3 03/11/2024 1214   VLDL 28.4 03/11/2024 1214   LDLCALC 67 03/11/2024 1214   LDLCALC 126 (H) 12/14/2016 1131   LDLDIRECT 116.0 03/12/2023 1456    PHYSICAL EXAM:     VS:  BP 128/70   Pulse 87   Ht 5\' 4"  (1.626 m)   Wt 177 lb 6.4 oz (80.5 kg)   SpO2 93%   BMI 30.45 kg/m   BMI: Body mass index is 30.45 kg/m.  Physical Exam Vitals reviewed.  Constitutional:      Appearance: She is well-developed.  HENT:     Head: Normocephalic and atraumatic.  Eyes:     General:        Right eye: No discharge.        Left eye: No discharge.  Cardiovascular:     Rate and Rhythm: Normal rate and regular rhythm.     Heart sounds: Normal heart sounds, S1 normal and S2 normal. Heart sounds not distant. No midsystolic click and no opening snap. No murmur heard.    No friction rub.  Pulmonary:     Effort: Pulmonary effort is normal. No respiratory distress.     Breath sounds: Normal breath sounds. No decreased breath sounds, wheezing, rhonchi or rales.  Chest:     Chest wall: No tenderness.  Musculoskeletal:     Cervical back: Normal range of motion.     Right lower leg: No edema.     Left lower leg: No edema.  Skin:    General: Skin is warm and dry.     Nails: There is no clubbing.  Neurological:     Mental Status: She is alert and oriented to person, place, and time.  Psychiatric:        Speech: Speech normal.        Behavior: Behavior normal.        Thought Content: Thought content normal.        Judgment: Judgment normal.     Wt Readings from Last 3 Encounters:  03/17/24 177 lb 6.4 oz (80.5 kg)  03/11/24 174 lb (78.9 kg)  01/10/24 173 lb (78.5 kg)     ASSESSMENT & PLAN:   Precordial pain with associated exertional dyspnea and fatigue: Several month history of exertional chest discomfort with associated dyspnea and fatigue.  Prior ischemic evaluation reassuring in 2018.  Schedule coronary CTA and echo to evaluate for progressive ischemic heart disease and new cardiomyopathy.  Cannot exclude some degree of fatigue in the setting of Toprol -XL 37.5 mg daily.  However, in the context of ongoing palpitations we we will continue beta-blocker for now.   Labs stable obtained last week.  Mitral regurgitation: Prior echo has demonstrated mitral valve systolic bowing without prolapse in 2018 with trivial regurgitation.  Mitral regurgitation mild by echo in 2022.  Update echo.  HTN: Blood pressure is well-controlled in the office.  No changes in pharmacotherapy.  Aortic atherosclerosis/HLD: LDL 67 in 02/2024 with normal AST/ALT at that time.  Tolerating rosuvastatin  10 mg.  Palpitations: Longstanding and stable.  Remains on Toprol -XL 37.5 mg daily.   Disposition: F/u with Dr. Nolan Battle or an APP in 2 months.   Medication Adjustments/Labs and Tests Ordered: Current medicines are reviewed at length with the patient today.  Concerns regarding medicines are outlined above. Medication changes, Labs and Tests ordered today are summarized above and listed in the Patient Instructions accessible in Encounters.   Signed, Varney Gentleman, PA-C 03/17/2024 4:15 PM     [facility] -  [place] [address] [place], [place] [address] [phone]

## 2024-03-17 ENCOUNTER — Ambulatory Visit: Attending: Physician Assistant | Admitting: Physician Assistant

## 2024-03-17 ENCOUNTER — Encounter: Payer: Self-pay | Admitting: Physician Assistant

## 2024-03-17 VITALS — BP 128/70 | HR 87 | Ht 64.0 in | Wt 177.4 lb

## 2024-03-17 DIAGNOSIS — I700 Atherosclerosis of aorta: Secondary | ICD-10-CM | POA: Diagnosis not present

## 2024-03-17 DIAGNOSIS — R072 Precordial pain: Secondary | ICD-10-CM | POA: Diagnosis not present

## 2024-03-17 DIAGNOSIS — Z79899 Other long term (current) drug therapy: Secondary | ICD-10-CM

## 2024-03-17 DIAGNOSIS — R0609 Other forms of dyspnea: Secondary | ICD-10-CM

## 2024-03-17 DIAGNOSIS — R002 Palpitations: Secondary | ICD-10-CM | POA: Diagnosis not present

## 2024-03-17 DIAGNOSIS — I10 Essential (primary) hypertension: Secondary | ICD-10-CM | POA: Diagnosis not present

## 2024-03-17 DIAGNOSIS — I340 Nonrheumatic mitral (valve) insufficiency: Secondary | ICD-10-CM

## 2024-03-17 DIAGNOSIS — R5383 Other fatigue: Secondary | ICD-10-CM

## 2024-03-17 MED ORDER — METOPROLOL TARTRATE 50 MG PO TABS: ORAL_TABLET | ORAL | 0 refills | Status: DC

## 2024-03-17 NOTE — Patient Instructions (Signed)
 Medication Instructions:  Your physician recommends the following medication changes.  TAKE: Metoprolol  tartrate 100 mg about 90-120 minutes prior to scan  *If you need a refill on your cardiac medications before your next appointment, please call your pharmacy*  Lab Work: Your provider would like for you to have following labs drawn today BMeT.   If you have labs (blood work) drawn today and your tests are completely normal, you will receive your results only by: MyChart Message (if you have MyChart) OR A paper copy in the mail If you have any lab test that is abnormal or we need to change your treatment, we will call you to review the results.  Testing/Procedures: Your physician has requested that you have an echocardiogram. Echocardiography is a painless test that uses sound waves to create images of your heart. It provides your doctor with information about the size and shape of your heart and how well your heart's chambers and valves are working.   You may receive an ultrasound enhancing agent through an IV if needed to better visualize your heart during the echo. This procedure takes approximately one hour.  There are no restrictions for this procedure.  This will take place at [address] [facility] [address], [place] [address]  Please note: We ask at that you not bring children with you during ultrasound (echo/ vascular) testing. Due to room size and safety concerns, children are not allowed in the ultrasound rooms during exams. Our front office staff cannot provide observation of children in our lobby area while testing is being conducted. An adult accompanying a patient to their appointment will only be allowed in the ultrasound room at the discretion of the ultrasound technician under special circumstances. We apologize for any inconvenience.    Your cardiac CT will be scheduled at one of the below locations:   [facility] [address] [place], [place] [address] [phone]  OR   [facility] [address] [place], [place] [address] [phone]  If scheduled at [facility] or [facility], please arrive 15 mins early for check-in and test prep.  Please follow these instructions carefully (unless otherwise directed):  An IV will be required for this test and Nitroglycerin  will be given.   On the Night Before the Test: Be sure to Drink plenty of water. Do not consume any caffeinated/decaffeinated beverages or chocolate 12 hours prior to your test. Do not take any antihistamines 12 hours prior to your test.  On the Day of the Test: Drink plenty of water until 1 hour prior to the test. Do not eat any food 1 hour prior to test. You may take your regular medications prior to the test.  Take metoprolol  (Lopressor ) two hours prior to test. If you take Furosemide /Hydrochlorothiazide /Spironolactone/Chlorthalidone, please HOLD on the morning of the test. Patients who wear a continuous glucose monitor MUST remove the device prior to scanning. FEMALES- please wear underwire-free bra if available, avoid dresses & tight clothing      After the Test: Drink plenty of water. After receiving IV contrast, you may experience a mild flushed feeling. This is normal. On occasion, you may experience a mild rash up to 24 hours after the test. This is not dangerous. If this occurs, you can take Benadryl  25 mg, Zyrtec, Claritin , or Allegra and increase your fluid intake. (Patients taking Tikosyn should avoid Benadryl , and may take Zyrtec, Claritin , or Allegra) If  you experience trouble breathing, this can be serious. If it is severe call 911 IMMEDIATELY. If it is mild, please call our office.  We will call to schedule your test 2-4 weeks out understanding that some insurance companies will need an authorization prior to the service being  performed.   For more information and frequently asked questions, please visit our website : [email or web address]  For non-scheduling related questions, please contact the cardiac imaging nurse navigator should you have any questions/concerns: Cardiac Imaging Nurse Navigators Direct Office Dial: [phone]   For scheduling needs, including cancellations and rescheduling, please call [place], [phone].   Follow-Up: At [facility], you and your health needs are our priority.  As part of our continuing mission to provide you with exceptional heart care, our providers are all part of one team.  This team includes your primary Cardiologist (physician) and Advanced Practice Providers or APPs (Physician Assistants and Nurse Practitioners) who all work together to provide you with the care you need, when you need it.  Your next appointment:   2 month(s)  Provider:   You may see Sammy Crisp, MD or Varney Gentleman, PA-C

## 2024-03-18 LAB — BASIC METABOLIC PANEL WITH GFR
BUN/Creatinine Ratio: 25 (ref 12–28)
BUN: 18 mg/dL (ref 8–27)
CO2: 22 mmol/L (ref 20–29)
Calcium: 9.5 mg/dL (ref 8.7–10.3)
Chloride: 102 mmol/L (ref 96–106)
Creatinine, Ser: 0.71 mg/dL (ref 0.57–1.00)
Glucose: 139 mg/dL — ABNORMAL HIGH (ref 70–99)
Potassium: 4.1 mmol/L (ref 3.5–5.2)
Sodium: 144 mmol/L (ref 134–144)
eGFR: 94 mL/min/{1.73_m2} (ref >59)

## 2024-03-19 ENCOUNTER — Ambulatory Visit: Admitting: Internal Medicine

## 2024-03-19 ENCOUNTER — Encounter (INDEPENDENT_AMBULATORY_CARE_PROVIDER_SITE_OTHER): Payer: Self-pay

## 2024-03-19 ENCOUNTER — Encounter: Payer: Self-pay | Admitting: Internal Medicine

## 2024-03-19 VITALS — BP 122/70 | HR 93 | Ht 64.0 in | Wt 175.4 lb

## 2024-03-19 DIAGNOSIS — E1142 Type 2 diabetes mellitus with diabetic polyneuropathy: Secondary | ICD-10-CM

## 2024-03-19 DIAGNOSIS — E66811 Obesity, class 1: Secondary | ICD-10-CM | POA: Diagnosis not present

## 2024-03-19 DIAGNOSIS — E1165 Type 2 diabetes mellitus with hyperglycemia: Secondary | ICD-10-CM | POA: Diagnosis not present

## 2024-03-19 DIAGNOSIS — Z7984 Long term (current) use of oral hypoglycemic drugs: Secondary | ICD-10-CM | POA: Diagnosis not present

## 2024-03-19 DIAGNOSIS — E782 Mixed hyperlipidemia: Secondary | ICD-10-CM

## 2024-03-19 DIAGNOSIS — Z794 Long term (current) use of insulin: Secondary | ICD-10-CM

## 2024-03-19 NOTE — Progress Notes (Signed)
 Patient ID: Shannon [name], female   DOB: Jan 24, 1956, 68 y.o.   MRN: [id number]   HPI: Shannon [name] is a 68 y.o.-year-old female, initially referred by her PCP, Dr. Joelle [name], returning for follow-up for DM2, dx in 2012, insulin -dependent since ~2019, uncontrolled, with complications (peripheral neuropathy). Last visit 4 months ago.    Interim history: She has occasional nausea, no increased urination, nausea.  She does have blurry vision. She usually stays late at night and eats snacks, including sweets. She gets steroid inj in back and hips - last 4 mo ago. She has increased fatigue with essentially nonrevealing investigation by PCP.  She is under investigation by cardiology now.  Reviewed history: Patient described that her sugars worsened after she had to stop Metformin  due to GI symptoms and was changed to sulfonylurea. They increased even further after steroid injection for Achilles tendinitis.  Her sugars were initially in the 300-400 range, but then increased to 500-HI (>600) >> she presented to the emergency room (11/12/2020).  Afterwards, she started to work on her diet.  Insulin  pump: -OmniPod Dash >> Omnipod 5  Insulin : -She has allergy to Humalog/Lyumjev  >> significant irritation and inflammation at the infusion site -Switched to NovoLog  samples since NovoLog  was not covered by her insurance despite 2 preauthorization requests - U500 now  CGM: -Shannon [name] G6  Supplies: -Mail-order pharmacy for Shannon [name]  Reviewed HbA1c levels: Lab Results  Component Value Date   HGBA1C 8.1 (H) 03/11/2024   HGBA1C 7.4 (A) 11/06/2023   HGBA1C 6.9 (A) 03/06/2023   HGBA1C 7.3 (A) 11/23/2022   HGBA1C 8.5 (A) 07/20/2022   HGBA1C 8.5 (A) 03/16/2022   HGBA1C 8.2 (A) 11/15/2021   HGBA1C 8.0 (A) 07/22/2021   HGBA1C 10.4 (A) 02/15/2021   HGBA1C 9.0 (A) 11/16/2020   HGBA1C 7.8 (A) 07/13/2020   HGBA1C 8.1 (H) 12/22/2019   HGBA1C 7.3 (A) 07/04/2019   HGBA1C 8.9 (H) 01/22/2019   HGBA1C 8.8 (H)  10/15/2018   HGBA1C 7.9 (H) 02/13/2018   HGBA1C 7.0 (H) 09/19/2017   HGBA1C 8.6 (H) 04/13/2017   HGBA1C 8.2 (H) 11/16/2016   HGBA1C 7.4 (H) 05/17/2016   HGBA1C 7.8 (H) 11/19/2015   HGBA1C 7.2 (H) 04/23/2015   HGBA1C 8.4 (H) 01/14/2015   HGBA1C 8.6 (H) 09/22/2014   HGBA1C 8.0 (H) 03/31/2014   HGBA1C 6.9 (H) 09/23/2013   HGBA1C 7.0 (H) 03/17/2013   HGBA1C 6.5 09/16/2012   HGBA1C 6.6 (H) 02/20/2012   HGBA1C 9.0 (H) 10/19/2011  07/06/2023: HbA1c 6.8%  Previously on: - Glimepiride  2 >> 4 mg before breakfast   - Onglyza 5 mg before breakfast - Lantus  30-35 units at bedtime She is intolerant to Metformin  >> N/V/D >> had to stop and changed to Glimepiride .  Previously on: - Ozempic  0.5 mg weekly >> stopped 2/2 nausea - Lantus  26 >> 34 >> Toujeo  46 units at bedtime - Lyumjev  6-10 >> 12-18 >> 15 >> 20 units before each meal  Now on: - Pump settings with U500 - changes suggested at last OV are shown in bold: - basal rates: 12 am: 1.0  3 am: 0.9  6 am: 0.7  9 am: 1 9 PM: 1.1  - ICR:    (start the bolus 45-60 min before the meal)  12 AM: 1:11  12 pm: 1:11 >> 1:10  4 PM: 1:11 >> 1:10 8 PM: 1:8 - target: 120-120  - ISF: 1:60 - Active insulin  time: 6 hours  Also, continue: - Jardiance  10 mg before b'fast -restarted  11/2022 -  -started 11/2022 >> stopped 2/2 severe constipation  TDD from basal insulin :  62% >> 69% >> 73% (27 units) TDD from bolus insulin : 20% >> 32% >> 27% (10 units) Total daily dose: 32-40 U500 units/day >> up to 200 units of insulin  a day - extended bolusing: not using - changes infusion site: q1-2 days  She checks her sugars more than 4 times a day with her  Shannon [name] CGM:  Prev.:  Previously:  Lowest sugar was LO >> .Aaron Aas. 40 >> 39 (sensor); she has hypoglycemia awareness at 110.  Highest sugar was HI ... >> 300s >> 300s  Glucometer: One Touch Verio  Pt's meals are: - Breakfast: usually skips, when eats b'fast: sausage bisquit - Lunch: salad or  sandwich - Dinner: meat and 2 veggies - Snacks: 1-2: apples, oranges, occasionally chips  -+ CKD, last BUN/creatinine:  Lab Results  Component Value Date   BUN 18 03/17/2024   BUN 15 03/11/2024   CREATININE 0.71 03/17/2024   CREATININE 0.87 03/11/2024   Lab Results  Component Value Date   MICRALBCREAT 20.6 03/11/2024   MICRALBCREAT 0.9 08/08/2023   MICRALBCREAT 3.7 03/12/2023   MICRALBCREAT 1.8 05/17/2016   MICRALBCREAT 1.4 04/23/2015   MICRALBCREAT 2.7 09/23/2013   MICRALBCREAT 1.1 09/16/2012  She is not on ACE inhibitor/ARB. She had cough with lisinopril.  -+ HL; last set of lipids: Lab Results  Component Value Date   CHOL 157 03/11/2024   HDL 61.30 03/11/2024   LDLCALC 67 03/11/2024   LDLDIRECT 116.0 03/12/2023   TRIG 142.0 03/11/2024   CHOLHDL 3 03/11/2024  On Crestor  5 mg  daily.  - last eye exam was in 09/2023: No DR reportedly. She has glaucoma - exams q6 mo. Her blurry vision improved (had laser surgery for glaucoma 05/2021).   - She has numbness and tingling in her toes. She also has muscle cramps in her feet.  Last foot exam 03/12/2023.  Pt has FH of DM in mother, son, daughter, sister, MGM, MGF, M aunt and uncles.  She also has a history of HTN, fatty liver, GERD. She has psoriatic arthritis and ankylosing spondylitis. She has significant joint pain - hands, back, hips.  She is on a biologic infusion (Cosentyx). She presented to the ED for nausea on 12/24/2019.  Per review of the labs, she appeared dehydrated, but no distinct pathology was found.  Of note, lipase was normal. She started Lopressor  for HTN - but had dizziness >> stopped.  TSH was normal: Lab Results  Component Value Date   TSH 0.70 03/11/2024   ROS: + see HPI  I reviewed pt's medications, allergies, PMH, social hx, family hx, and changes were documented in the history of present illness. Otherwise, unchanged from my initial visit note.  Past Medical History:  Diagnosis Date   Actinic  keratosis    Allergy    Arthritis    hip, back, shoulders, joints   Asthma    Complication of anesthesia    problems with block during breast augmentation and epidural during childbirth   Depression    Diabetes mellitus 09/2011   type 2   Family history of adverse reaction to anesthesia    PROBLEMS WITH BLOCKS   Fatty liver    GERD (gastroesophageal reflux disease)    Glaucoma suspect    Headache    migraines-4x a week headache-1x week migraine   History of hiatal hernia    Hyperlipidemia    Hypertension    Controlled on  meds   Mitral valve prolapse    Nose colonized with MRSA 05/15/2018   Osteopenia    Pneumonia    PONV (postoperative nausea and vomiting)    Psoriasis    Shingles 11/2006   Sleep disturbance    Tachycardia    Vertigo    Hx of   Past Surgical History:  Procedure Laterality Date   ABDOMINAL HYSTERECTOMY  2000   /BSO - abn. Paps   APPENDECTOMY  1978   AUGMENTATION MAMMAPLASTY Bilateral 1981   BLADDER REPAIR     x2   BREAST ENHANCEMENT SURGERY  1987   CARDIAC CATHETERIZATION     [facility]   CARDIAC CATHETERIZATION Left 12/18/2016   Procedure: Left Heart Cath and Coronary Angiography;  Surgeon: Wenona Hamilton, MD;  Location: [facility] INVASIVE CV LAB;  Service: Cardiovascular;  Laterality: Left;   CARPAL TUNNEL RELEASE Bilateral    CERVICAL DISCECTOMY  11/2009   with synthetic discs inserted--  Dr Larrie Po   COLONOSCOPY     JOINT REPLACEMENT     Left Hip Replacement Left 02/01/2023   NM MYOVIEW  LTD  03/2005   EF 56%, no ischemia   RECTAL PROLAPSE REPAIR     x1   SHOULDER ARTHROSCOPY WITH OPEN ROTATOR CUFF REPAIR Left 12/29/2019   Procedure: LEFT SHOULDER ARTHROSCOPIC DISTAL CLAVICLE EXCISISON, SUBACROMIAL DECOMPRESSION,BICEPS TENOTOMY,ROTATOR CUFF REPAIR ;  Surgeon: Jerlyn Moons, MD;  Location: [facility] ORS;  Service: Orthopedics;  Laterality: Left;   SHOULDER CLOSED REDUCTION Left 05/03/2020   Procedure: Left shoulder arthroscopic lysis of adhesions and  manipulation under anesthesia;  Surgeon: Jerlyn Moons, MD;  Location: [facility] ORS;  Service: Orthopedics;  Laterality: Left;   SHOULDER SURGERY  09/2008, 01/2009   Right shoulder surgery, then redone with lysis of adhesions   TOTAL HIP ARTHROPLASTY Right 05/29/2018   Procedure: TOTAL HIP ARTHROPLASTY ANTERIOR APPROACH;  Surgeon: Jerlyn Moons, MD;  Location: [facility] ORS;  Service: Orthopedics;  Laterality: Right;   TOTAL HIP ARTHROPLASTY Left 01/2023   UPPER GI ENDOSCOPY     Social History   Socioeconomic History   Marital status: Married    Spouse name: Not on file   Number of children: 5   Years of education: Not on file   Highest education level: Not on file  Occupational History   Occupation: homemaker  Tobacco Use   Smoking status: Never    Passive exposure: Past   Smokeless tobacco: Never  Vaping Use   Vaping status: Never Used  Substance and Sexual Activity   Alcohol use: No   Drug use: No   Sexual activity: Not on file  Other Topics Concern   Not on file  Social History Narrative   Home maker   Social Drivers of Health   Financial Resource Strain: Not on file  Food Insecurity: Not on file  Transportation Needs: Not on file  Physical Activity: Not on file  Stress: Not on file  Social Connections: Not on file  Intimate Partner Violence: Not on file   Current Outpatient Medications on File Prior to Visit  Medication Sig Dispense Refill   ALPRAZolam  (XANAX ) 0.25 MG tablet Take 1 tablet (0.25 mg total) by mouth 2 (two) times daily as needed for anxiety. 20 tablet 0   aspirin -acetaminophen -caffeine  (EXCEDRIN  MIGRAINE) 250-250-65 MG tablet Take 1 tablet by mouth every 6 (six) hours as needed for headache.     Blood Glucose Monitoring Suppl (FIFTY50 GLUCOSE METER 2.0) w/Device KIT      Blood Glucose  Monitoring Suppl (ONETOUCH VERIO) w/Device KIT Use as advised 1 kit 1   cetirizine (ZYRTEC) 10 MG tablet Take 10 mg by mouth daily.      clobetasol  (TEMOVATE ) 0.05 %  external solution MIX IN JAR OF CERAVE CREAM, THEN APPLY TO BACK TWICE DAILY UNTIL ITCH IMPROVED - AVOID FACE, GROIN, AND AXILLA 50 mL 1   Continuous Glucose Sensor (Shannon [name] G6 SENSOR) MISC CHANGE 1 SENSOR EVERY 10 DAYS AS DIRECTED 9 each 3   Continuous Glucose Transmitter (Shannon [name] G6 TRANSMITTER) MISC USE 1 TRANSMITTER EVERY 3 MONTHS 1 each 3   docusate sodium  (COLACE) 100 MG capsule Take 300 mg by mouth at bedtime.      DULoxetine  (CYMBALTA ) 60 MG capsule TAKE 1 CAPSULE BY MOUTH DAILY 90 capsule 3   Fluocinolone  Acetonide 0.01 % OIL Apply 1-2 gtts to ears once to twice daily as needed until improved. 20 mL 2   fluticasone  (FLONASE ) 50 MCG/ACT nasal spray Place 1 spray into both nostrils daily.     folic acid (FOLVITE) 1 MG tablet Take 1 mg by mouth daily.     furosemide  (LASIX ) 20 MG tablet TAKE 1 TABLET BY MOUTH DAILY 90 tablet 3   gabapentin  (NEURONTIN ) 100 MG capsule Take 100mg  twice a day and 2 at bedtime 360 capsule 3   Glucagon  3 MG/DOSE POWD Place 3 mg into the nose once as needed for up to 1 dose. 1 each 11   glucose blood (ONETOUCH ULTRA) test strip Use as instructed 2x a day 100 strip 11   HUMULIN  R 500 UNIT/ML injection INJECT SUBCUTANEOUSLY UP TO 200  UNITS IN THE INSULIN  PUMP DAILY 60 mL 3   HYDROcodone -acetaminophen  (NORCO/VICODIN) 5-325 MG tablet Take 1 tablet by mouth every 4 (four) hours as needed for moderate pain (pain score 4-6). 60 tablet 0   Insulin  Disposable Pump (OMNIPOD 5 DEXG7G6 INTRO GEN 5) KIT USE AS DIRECTED 1 kit 0   Insulin  Disposable Pump (OMNIPOD 5 DEXG7G6 PODS GEN 5) MISC USE 1 POD EVERY OTHER DAY 45 each 3   JARDIANCE  10 MG TABS tablet TAKE 1 TABLET BY MOUTH DAILY  BEFORE BREAKFAST 90 tablet 3   LUMIGAN 0.01 % SOLN Place 1 drop into both eyes at bedtime.      methocarbamol (ROBAXIN) 750 MG tablet Take 1 tablet 3 times a day by oral route as needed.     methotrexate (RHEUMATREX) 2.5 MG tablet Take by mouth.     metoprolol  succinate (TOPROL -XL) 25 MG 24 hr tablet  TAKE 1 AND 1/2 TABLETS BY MOUTH  DAILY 135 tablet 3   metoprolol  tartrate (LOPRESSOR ) 50 MG tablet TAKE BOTH TABLETS ABOUT 2 HR PRIOR TO CARDIAC PROCEDURE 2 tablet 0   mometasone  (ELOCON ) 0.1 % cream Apply 1 Application topically daily.     ondansetron  (ZOFRAN -ODT) 4 MG disintegrating tablet DISSOLVE 1 TABLET ON THE TONGUE  EVERY 6 HOURS AS NEEDED FOR  NAUSEA AND VOMITING 180 tablet 0   OneTouch Delica Lancets 33G MISC Use 2x a day 100 each 11   pantoprazole  (PROTONIX ) 40 MG tablet Take 1 tablet (40 mg total) by mouth 2 (two) times daily. 180 tablet 3   potassium chloride  SA (KLOR-CON  M) 20 MEQ tablet Take 1 tablet (20 mEq total) by mouth daily. 90 tablet 3   prochlorperazine  (COMPAZINE ) 10 MG tablet Take 1 tablet (10 mg total) by mouth every 6 (six) hours as needed. 12 tablet 0   rosuvastatin  (CRESTOR ) 10 MG tablet Take 1 tablet (10  mg total) by mouth daily. (Patient taking differently: Take 20 mg by mouth daily.) 90 tablet 3   SKYRIZI PEN 150 MG/ML pen Inject into the skin.     traZODone  (DESYREL ) 100 MG tablet TAKE 2 TABLETS BY MOUTH EVERY  NIGHT AT BEDTIME 180 tablet 3   triamterene -hydrochlorothiazide  (MAXZIDE -25) 37.5-25 MG tablet TAKE 1 TABLET BY MOUTH ONCE  DAILY 90 tablet 3   valACYclovir  (VALTREX ) 1000 MG tablet TAKE 2 TABLETS BY MOUTH AT ONSET OF SYMPTOMS AS NEEDED FOR FEVER  BLISTERS AND TAKE 2 TABLETS BY  MOUTH 12 HOURS LATER 90 tablet 3   No current facility-administered medications on file prior to visit.   Allergies  Allergen Reactions   Humalog [Insulin  Lispro] Swelling   Lisinopril Cough   Nitrofurantoin Other (See Comments)    Pt unsure   Oxycodone -Acetaminophen  Itching   Percocet [Oxycodone -Acetaminophen ]    Atorvastatin  Diarrhea    May have caused colitis--but not clear cut   Clarithromycin Nausea Only   Glipizide  Other (See Comments)    Sugars too variable   Invokana  [Canagliflozin ]     Nausea, constipation   Family History  Problem Relation Age of Onset    Diabetes Mother    Heart disease Mother    Hypertension Mother    Ovarian cancer Mother    Melanoma Mother    Cancer Father    Coronary artery disease Father    Heart disease Father    Diabetes Sister    Depression Sister    Coronary artery disease Sister    Heart disease Sister    Pulmonary Hypertension Sister    Rheum arthritis Maternal Aunt    Heart attack Paternal Uncle    Heart attack Paternal Uncle    Breast cancer Maternal Grandmother    Diabetes Maternal Grandmother    Diabetes Maternal Grandfather    Heart attack Cousin    Colon cancer Neg Hx    PE: BP 122/70   Pulse 93   Ht 5\' 4"  (1.626 m)   Wt 175 lb 6.4 oz (79.6 kg)   SpO2 96%   BMI 30.11 kg/m  Wt Readings from Last 10 Encounters:  03/19/24 175 lb 6.4 oz (79.6 kg)  03/17/24 177 lb 6.4 oz (80.5 kg)  03/11/24 174 lb (78.9 kg)  01/10/24 173 lb (78.5 kg)  11/06/23 175 lb 12.8 oz (79.7 kg)  10/30/23 176 lb (79.8 kg)  10/25/23 174 lb (78.9 kg)  09/11/23 176 lb (79.8 kg)  09/06/23 173 lb 2 oz (78.5 kg)  08/16/23 172 lb (78 kg)   Constitutional: normal weight, in NAD Eyes: EOMI, no exophthalmos ENT: no thyromegaly, no cervical lymphadenopathy Cardiovascular: RRR, No MRG Respiratory: CTA B Musculoskeletal: no deformities Skin: no rashes Neurological: no tremor with outstretched hands Diabetic Foot Exam - Simple   Simple Foot Form Diabetic Foot exam was performed with the following findings: Yes 03/19/2024 10:28 AM  Visual Inspection No deformities, no ulcerations, no other skin breakdown bilaterally: Yes Sensation Testing Intact to touch and monofilament testing bilaterally: Yes Pulse Check Posterior Tibialis and Dorsalis pulse intact bilaterally: Yes Comments + calluses medial halluces B    ASSESSMENT: 1. DM2, insulin -dependent, uncontrolled, without long-term complications, but with significant hyperglycemia  2. HL  3. Obesity class 1  PLAN:  1. Patient with history of poorly controlled type  2 diabetes, insulin  resistant, previously on basal-bolus insulin  regimen, currently on the OmniPod 5 insulin  pump integrated with Shannon [name] CGM and using U-500 insulin  in the pump.  At  last visit, HbA1c was higher, at 7.4%.  Sugars were particularly higher after dinner in the 200s to 300s but also after lunch, up to 250s.  She was not always able to start the insulin  boluses 45 to 60 minutes before meal but I advised her to try to do so.  I advised her that the timing of insulin  is almost as important as the dose.  Sugars were at goal after dinner but she did have snacks in the evening, including candy.  She was only occasionally injecting insulin  for these but even when she did, sugar still increased to 300s whenever she was having the snacks.  I strongly advised her to stop these snacks, especially sweets.  Otherwise we did not change the regimen. - However, she had another HbA1c obtained few days ago which was even higher, at 8.1%. CGM interpretation: -At today's visit, we reviewed her CGM downloads: It appears that 51% of values are in target range (goal >70%), while 48% are higher than 180 (goal <25%), and 1% are lower than 70 (goal <4%).  The calculated average blood sugar is 177.  The projected HbA1c for the next 3 months (GMI) is 7.6%. -Reviewing the CGM trends, sugars appear to increase after breakfast and they remain elevated with occasional drops in blood sugars later in the day, before dinner.  Sugars after dinner are not significantly changed but they are increasing after approximately 8:30-9 PM, when she eats snacks.  Reviewing the pump downloads, she is mostly entering carbs, blood sugars, and bolusing in the morning, but less so later in the day.  She rarely enters carbs and bolus in the evening for dinner or snacks.  We discussed that ideally she would stop the snacks completely but she absolutely needs to enter the carbs and boluses in the second half of the day, also.  I did recommend a stronger  insulin  to carb ratio in the morning since sugars increased after breakfast despite the fact that she usually boluses before this meal. -I suggested: Patient Instructions  Please use the following pump settings: - basal rates: 12 am: 1.0  3 am: 0.9  6 am: 0.7  9 am: 1 9 PM: 1.1  - ICR:  (start the bolus 45-60 min before the meal)  12 AM: 1:11 >> 8  12 pm: 1:10  4 PM: 1:10 8 PM: 1:8 If sugars are too low after this, you can change to 1:10 or 1:9 STOP SNACKS AT NIGHT, especially sweets.  - target: 120-120  - ISF: 1:60 - Active insulin  time: 6 hours  Also, continue: - Jardiance  10 mg before b'fast.  Please return in 3-4 months.  - advised to check sugars at different times of the day - 4x a day, rotating check times - advised for yearly eye exams >> she is UTD - return to clinic in 3-4 months  2. HL - Latest lipid panel was at goal earlier this month: Lab Results  Component Value Date   CHOL 157 03/11/2024   HDL 61.30 03/11/2024   LDLCALC 67 03/11/2024   LDLDIRECT 116.0 03/12/2023   TRIG 142.0 03/11/2024   CHOLHDL 3 03/11/2024  -She continues on Crestor  5 mg daily without side effects  3. Obesity class 1 - she inquires about a medication that may help with weight loss.  Unfortunately, she was not able to tolerate even oral GLP-1 receptor agonist and we already have her on an SGLT2 inhibitor. -At today's visit she agreed with a referral to Weight  management clinic  Orders Placed This Encounter  Procedures   Amb Ref to Medical Weight Management    Emilie Harden, MD PhD [facility] Endocrinology

## 2024-03-19 NOTE — Patient Instructions (Addendum)
 Please use the following pump settings: - basal rates: 12 am: 1.0  3 am: 0.9  6 am: 0.7  9 am: 1 9 PM: 1.1  - ICR:  (start the bolus 45-60 min before the meal)  12 AM: 1:11 >> 8  12 pm: 1:10  4 PM: 1:10 8 PM: 1:8 If sugars are too low after this, you can change to 1:10 or 1:9 STOP SNACKS AT NIGHT, especially sweets.  - target: 120-120  - ISF: 1:60 - Active insulin  time: 6 hours  Also, continue: - Jardiance  10 mg before b'fast.  Please return in 3-4 months.

## 2024-03-25 ENCOUNTER — Encounter (HOSPITAL_COMMUNITY): Payer: Self-pay

## 2024-03-26 ENCOUNTER — Telehealth (HOSPITAL_COMMUNITY): Payer: Self-pay | Admitting: Emergency Medicine

## 2024-03-26 NOTE — Telephone Encounter (Signed)
Reaching out to patient to offer assistance regarding upcoming cardiac imaging study; pt verbalizes understanding of appt date/time, parking situation and where to check in, pre-test NPO status and medications ordered, and verified current allergies; name and call back number provided for further questions should they arise Marchia Bond RN Navigator Cardiac Imaging [facility] [phone] office [phone] cell  100mg  metoprolol

## 2024-03-27 ENCOUNTER — Ambulatory Visit
Admission: RE | Admit: 2024-03-27 | Discharge: 2024-03-27 | Disposition: A | Discharge status: Home / Self Care | Source: Ambulatory Visit | Attending: Physician Assistant | Admitting: Physician Assistant

## 2024-03-27 ENCOUNTER — Encounter: Payer: Self-pay | Admitting: Internal Medicine

## 2024-03-27 DIAGNOSIS — R072 Precordial pain: Secondary | ICD-10-CM | POA: Diagnosis present

## 2024-03-27 MED ORDER — METOPROLOL TARTRATE 5 MG/5ML IV SOLN
10.0000 mg | Freq: Once | INTRAVENOUS | Status: AC | PRN
  Administered 2024-03-27: 10 mg via INTRAVENOUS
  Filled 2024-03-27: qty 10

## 2024-03-27 MED ORDER — DILTIAZEM HCL 25 MG/5ML IV SOLN
10.0000 mg | INTRAVENOUS | Status: DC | PRN
  Administered 2024-03-27: 10 mg via INTRAVENOUS
  Filled 2024-03-27 (×2): qty 5

## 2024-03-27 MED ORDER — METOPROLOL TARTRATE 5 MG/5ML IV SOLN
INTRAVENOUS | Status: AC
  Filled 2024-03-27: qty 10

## 2024-03-27 MED ORDER — DILTIAZEM HCL 25 MG/5ML IV SOLN
INTRAVENOUS | Status: AC
  Filled 2024-03-27: qty 5

## 2024-03-27 MED ORDER — NITROGLYCERIN 0.4 MG SL SUBL
0.8000 mg | SUBLINGUAL_TABLET | Freq: Once | SUBLINGUAL | Status: DC
  Filled 2024-03-27: qty 25

## 2024-03-27 NOTE — Progress Notes (Signed)
 Arrived for Cardiac CT. Patient took pre medication Metoprolol  100 mg. Heart rate upper 80's to mid 90's. Administered Metoprolol  10 mg IVP and Cardizem 10 mg IVP. Heart rate remained the same. Notified Dr Junnie Olives ordered patient to be rescheduled with pre medication Metoprolol  100 mg PO and Ivabradine 15 mg PO. Notified nurse navigators. Patient verbalized understanding of plan of care.

## 2024-04-09 ENCOUNTER — Other Ambulatory Visit (HOSPITAL_COMMUNITY): Payer: Self-pay | Admitting: *Deleted

## 2024-04-09 ENCOUNTER — Telehealth (HOSPITAL_COMMUNITY): Payer: Self-pay | Admitting: *Deleted

## 2024-04-09 ENCOUNTER — Encounter (HOSPITAL_COMMUNITY): Payer: Self-pay

## 2024-04-09 MED ORDER — METOPROLOL TARTRATE 100 MG PO TABS: ORAL_TABLET | ORAL | 0 refills | Status: DC

## 2024-04-09 MED ORDER — IVABRADINE HCL 7.5 MG PO TABS: ORAL_TABLET | ORAL | 0 refills | Status: DC

## 2024-04-09 NOTE — Telephone Encounter (Signed)
Reaching out to patient to offer assistance regarding upcoming cardiac imaging study; pt verbalizes understanding of appt date/time, parking situation and where to check in, pre-test NPO status and medications ordered, and verified current allergies; name and call back number provided for further questions should they arise  Ladora Osterberg RN Navigator Cardiac Imaging [facility] [phone] office [phone] cell  Patient to take 100mg metoprolol tartrate and 15mg ivabradine two hours prior to her cardiac CT scan. 

## 2024-04-10 ENCOUNTER — Ambulatory Visit
Admission: RE | Admit: 2024-04-10 | Discharge: 2024-04-10 | Disposition: A | Discharge status: Home / Self Care | Source: Ambulatory Visit | Attending: Physician Assistant | Admitting: Physician Assistant

## 2024-04-10 ENCOUNTER — Ambulatory Visit: Payer: Self-pay | Admitting: Physician Assistant

## 2024-04-10 DIAGNOSIS — R072 Precordial pain: Secondary | ICD-10-CM | POA: Diagnosis present

## 2024-04-10 MED ORDER — IOHEXOL 350 MG/ML SOLN
80.0000 mL | Freq: Once | INTRAVENOUS | Status: AC | PRN
  Administered 2024-04-10: 80 mL via INTRAVENOUS

## 2024-04-10 MED ORDER — DILTIAZEM HCL 25 MG/5ML IV SOLN: 10.0000 mg | INTRAVENOUS | Status: DC | PRN

## 2024-04-10 MED ORDER — NITROGLYCERIN 0.4 MG SL SUBL
0.8000 mg | SUBLINGUAL_TABLET | Freq: Once | SUBLINGUAL | Status: AC
  Administered 2024-04-10: 0.8 mg via SUBLINGUAL

## 2024-04-10 MED ORDER — METOPROLOL TARTRATE 5 MG/5ML IV SOLN: 10.0000 mg | Freq: Once | INTRAVENOUS | Status: DC | PRN

## 2024-04-10 NOTE — Progress Notes (Signed)
Patient tolerated procedure well. W/C to lobby.  Ambulate w/o difficulty. Denies light headedness or being dizzy. Encouraged to drink extra water today and reasoning explained. Verbalized understanding. All questions answered. ABC intact. No further needs. Discharge from procedure area w/o issues.

## 2024-04-15 ENCOUNTER — Other Ambulatory Visit: Payer: Self-pay | Admitting: Internal Medicine

## 2024-04-24 ENCOUNTER — Ambulatory Visit: Admitting: Clinical

## 2024-04-24 ENCOUNTER — Ambulatory Visit: Attending: Physician Assistant

## 2024-04-24 DIAGNOSIS — F331 Major depressive disorder, recurrent, moderate: Secondary | ICD-10-CM

## 2024-04-24 DIAGNOSIS — R0609 Other forms of dyspnea: Secondary | ICD-10-CM

## 2024-04-24 LAB — ECHOCARDIOGRAM COMPLETE
AR max vel: 2.3 cm2
AV Area VTI: 2.48 cm2
AV Area mean vel: 2.3 cm2
AV Mean grad: 3 mmHg
AV Peak grad: 6.4 mmHg
Ao pk vel: 1.26 m/s
Area-P 1/2: 4.06 cm2
S' Lateral: 2.59 cm
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
## 2024-04-24 NOTE — Progress Notes (Signed)
   Doree Barthel, LCSW

## 2024-04-28 ENCOUNTER — Encounter: Admitting: Sleep Medicine

## 2024-05-05 ENCOUNTER — Ambulatory Visit: Admitting: Sleep Medicine

## 2024-05-11 ENCOUNTER — Encounter: Payer: Self-pay | Admitting: Internal Medicine

## 2024-05-12 ENCOUNTER — Encounter: Payer: Self-pay | Admitting: Dermatology

## 2024-05-12 ENCOUNTER — Ambulatory Visit: Payer: 59 | Admitting: Dermatology

## 2024-05-12 DIAGNOSIS — W908XXA Exposure to other nonionizing radiation, initial encounter: Secondary | ICD-10-CM | POA: Diagnosis not present

## 2024-05-12 DIAGNOSIS — I781 Nevus, non-neoplastic: Secondary | ICD-10-CM

## 2024-05-12 DIAGNOSIS — L578 Other skin changes due to chronic exposure to nonionizing radiation: Secondary | ICD-10-CM

## 2024-05-12 DIAGNOSIS — L814 Other melanin hyperpigmentation: Secondary | ICD-10-CM | POA: Diagnosis not present

## 2024-05-12 DIAGNOSIS — D1801 Hemangioma of skin and subcutaneous tissue: Secondary | ICD-10-CM

## 2024-05-12 DIAGNOSIS — Z7189 Other specified counseling: Secondary | ICD-10-CM

## 2024-05-12 DIAGNOSIS — B079 Viral wart, unspecified: Secondary | ICD-10-CM

## 2024-05-12 DIAGNOSIS — L821 Other seborrheic keratosis: Secondary | ICD-10-CM

## 2024-05-12 DIAGNOSIS — L820 Inflamed seborrheic keratosis: Secondary | ICD-10-CM | POA: Diagnosis not present

## 2024-05-12 DIAGNOSIS — S0081XA Abrasion of other part of head, initial encounter: Secondary | ICD-10-CM

## 2024-05-12 DIAGNOSIS — Z1283 Encounter for screening for malignant neoplasm of skin: Secondary | ICD-10-CM

## 2024-05-12 DIAGNOSIS — D229 Melanocytic nevi, unspecified: Secondary | ICD-10-CM

## 2024-05-12 DIAGNOSIS — T148XXA Other injury of unspecified body region, initial encounter: Secondary | ICD-10-CM

## 2024-05-12 DIAGNOSIS — D2361 Other benign neoplasm of skin of right upper limb, including shoulder: Secondary | ICD-10-CM

## 2024-05-12 MED ORDER — HYDROCODONE-ACETAMINOPHEN 5-325 MG PO TABS: 1.0000 | ORAL_TABLET | ORAL | 0 refills | Status: DC | PRN

## 2024-05-12 NOTE — Patient Instructions (Addendum)
 Recommend starting moisturizer with exfoliant (Urea, Salicylic acid, or Lactic acid) one to two times daily to help smooth rough and bumpy skin.  OTC options include Cetaphil Rough and Bumpy lotion (Urea), Eucerin Roughness Relief lotion or spot treatment cream (Urea), CeraVe SA lotion/cream for Rough and Bumpy skin (Sal Acid), Gold Bond Rough and Bumpy cream (Sal Acid), and AmLactin 12% lotion/cream (Lactic Acid).  If applying in morning, also apply sunscreen to sun-exposed areas, since these exfoliating moisturizers can increase sensitivity to sun.   Cryotherapy Aftercare  Wash gently with soap and water everyday.   Apply Vaseline and Band-Aid daily until healed.    Seborrheic Keratosis  What causes seborrheic keratoses? Seborrheic keratoses are harmless, common skin growths that first appear during adult life.  As time goes by, more growths appear.  Some people may develop a large number of them.  Seborrheic keratoses appear on both covered and uncovered body parts.  They are not caused by sunlight.  The tendency to develop seborrheic keratoses can be inherited.  They vary in color from skin-colored to gray, brown, or even black.  They can be either smooth or have a rough, warty surface.   Seborrheic keratoses are superficial and look as if they were stuck on the skin.  Under the microscope this type of keratosis looks like layers upon layers of skin.  That is why at times the top layer may seem to fall off, but the rest of the growth remains and re-grows.    Treatment Seborrheic keratoses do not need to be treated, but can easily be removed in the office.  Seborrheic keratoses often cause symptoms when they rub on clothing or jewelry.  Lesions can be in the way of shaving.  If they become inflamed, they can cause itching, soreness, or burning.  Removal of a seborrheic keratosis can be accomplished by freezing, burning, or surgery. If any spot bleeds, scabs, or grows rapidly, please return to  have it checked, as these can be an indication of a skin cancer.   Treatment for Blood Vessels on face Counseling for BBL / IPL / Laser and Coordination of Care Discussed the treatment option of Broad Band Light (BBL) /Intense Pulsed Light (IPL)/ Laser for skin discoloration, including brown spots and redness.  Typically we recommend at least 1-3 treatment sessions about 5-8 weeks apart for best results.  Cannot have tanned skin when BBL performed, and regular use of sunscreen/photoprotection is advised after the procedure to help maintain results. The patient's condition may also require maintenance treatments in the future.  The fee for BBL / laser treatments is $350 per treatment session for the whole face.  A fee can be quoted for other parts of the body.  Insurance typically does not pay for BBL/laser treatments and therefore the fee is an out-of-pocket cost. Recommend prophylactic valtrex  treatment. Once scheduled for procedure, will send Rx in prior to patient's appointment. May schedule during the fall/winter.   Due to recent changes in healthcare laws, you may see results of your pathology and/or laboratory studies on MyChart before the doctors have had a chance to review them. We understand that in some cases there may be results that are confusing or concerning to you. Please understand that not all results are received at the same time and often the doctors may need to interpret multiple results in order to provide you with the best plan of care or course of treatment. Therefore, we ask that you please give us  2 business  days to thoroughly review all your results before contacting the office for clarification. Should we see a critical lab result, you will be contacted sooner.   If You Need Anything After Your Visit  If you have any questions or concerns for your doctor, please call our main line at [phone] and press option 4 to reach your doctor's medical assistant. If no one answers,  please leave a voicemail as directed and we will return your call as soon as possible. Messages left after 4 pm will be answered the following business day.   You may also send us  a message via MyChart. We typically respond to MyChart messages within 1-2 business days.  For prescription refills, please ask your pharmacy to contact our office. Our fax number is [phone].  If you have an urgent issue when the clinic is closed that cannot wait until the next business day, you can page your doctor at the number below.    Please note that while we do our best to be available for urgent issues outside of office hours, we are not available 24/7.   If you have an urgent issue and are unable to reach us , you may choose to seek medical care at your doctor's office, retail clinic, urgent care center, or emergency room.  If you have a medical emergency, please immediately call 911 or go to the emergency department.  Pager Numbers  - Dr. Hester: [phone]  - Dr. Jackquline: [phone]  - Dr. Claudene: [phone]   In the event of inclement weather, please call our main line at [phone] for an update on the status of any delays or closures.  Dermatology Medication Tips: Please keep the boxes that topical medications come in in order to help keep track of the instructions about where and how to use these. Pharmacies typically print the medication instructions only on the boxes and not directly on the medication tubes.   If your medication is too expensive, please contact our office at [phone] option 4 or send us  a message through MyChart.   We are unable to tell what your co-pay for medications will be in advance as this is different depending on your insurance coverage. However, we may be able to find a substitute medication at lower cost or fill out paperwork to get insurance to cover a needed medication.   If a prior authorization is required to get your medication covered by your  insurance company, please allow us  1-2 business days to complete this process.  Drug prices often vary depending on where the prescription is filled and some pharmacies may offer cheaper prices.  The website www.goodrx.com contains coupons for medications through different pharmacies. The prices here do not account for what the cost may be with help from insurance (it may be cheaper with your insurance), but the website can give you the price if you did not use any insurance.  - You can print the associated coupon and take it with your prescription to the pharmacy.  - You may also stop by our office during regular business hours and pick up a GoodRx coupon card.  - If you need your prescription sent electronically to a different pharmacy, notify our office through [facility] or by phone at [phone] option 4.     Si Usted Necesita Algo Despus de Su Visita  Tambin puede enviarnos un mensaje a travs de Clinical cytogeneticist. Por lo general respondemos a los mensajes de MyChart en el transcurso de 1 a 2 [address]  [address]  [place].  Para renovar recetas, por favor pida a su farmacia que se ponga en contacto con nuestra oficina. Randi lakes de fax es [place] [phone].  Si tiene un asunto urgente cuando la clnica est cerrada y que no puede esperar hasta el siguiente da hbil, puede llamar/localizar a su doctor(a) al nmero que aparece a continuacin.   Por favor, tenga en cuenta que aunque hacemos todo lo posible para estar disponibles para asuntos urgentes fuera del horario de [place], no estamos disponibles las 24 horas del da, los 7 [address]  [address] de la [place].   Si tiene un problema urgente y no puede comunicarse con nosotros, puede optar por buscar atencin mdica  en el consultorio de su doctor(a), en una clnica privada, en un centro de atencin urgente o en una sala de emergencias.  Si tiene Engineer, drilling, por favor llame inmediatamente al 911 o vaya a la sala de emergencias.  Nmeros de  bper  - Dr. Hester: [phone]  - Dra. Jackquline: [phone]  - Dr. Claudene: [phone]   En caso de inclemencias del tiempo, por favor llame a landry capes principal al [phone] para una actualizacin sobre el [place] de cualquier retraso o cierre.  Consejos para la medicacin en dermatologa: Por favor, guarde las cajas en las que vienen los medicamentos de uso tpico para ayudarle a seguir las instrucciones sobre dnde y cmo usarlos. Las farmacias generalmente imprimen las instrucciones del medicamento slo en las cajas y no directamente en los tubos del [place].   Si su medicamento es muy caro, por favor, pngase en contacto con landry rieger llamando al [phone] y presione la opcin 4 o envenos un mensaje a travs de Clinical cytogeneticist.   No podemos decirle cul ser su copago por los medicamentos por adelantado ya que esto es diferente dependiendo de la cobertura de su seguro. Sin embargo, es posible que podamos encontrar un medicamento sustituto a Audiological scientist un formulario para que el seguro cubra el medicamento que se considera necesario.   Si se requiere una autorizacin previa para que su compaa de seguros malta su medicamento, por favor permtanos de 1 a 2 das hbiles para completar este proceso.  Los precios de los medicamentos varan con frecuencia dependiendo del Environmental consultant de dnde se surte la receta y alguna farmacias pueden ofrecer precios ms baratos.  El sitio web www.goodrx.com tiene cupones para medicamentos de Health and safety inspector. Los precios aqu no tienen en cuenta lo que podra costar con la ayuda del seguro (puede ser ms barato con su seguro), pero el sitio web puede darle el precio si no utiliz Tourist information centre manager.  - Puede imprimir el cupn correspondiente y llevarlo con su receta a la farmacia.  - Tambin puede pasar por nuestra oficina durante el horario de atencin regular y Education officer, museum una tarjeta de cupones de GoodRx.  - Si necesita que su receta se  enve electrnicamente a una farmacia diferente, informe a nuestra oficina a travs de MyChart de [place] o por telfono llamando al [phone] y presione la opcin 4.

## 2024-05-12 NOTE — Telephone Encounter (Signed)
 LAST APPOINTMENT DATE:03/11/24   NEXT APPOINTMENT DATE: no f/u     LAST REFILL:03/11/24  QTY: #60 no rf

## 2024-05-12 NOTE — Progress Notes (Signed)
 Follow-Up Visit   Subjective  Shannon [name] is a 68 y.o. female who presents for the following: Skin Cancer Screening and Full Body Skin Exam  The patient presents for Total-Body Skin Exam (TBSE) for skin cancer screening and mole check. The patient has spots, moles and lesions to be evaluated, some may be new or changing. She has several irritated, itchy spots to check today, she will point out. History of AKs. No history of skin cancer.   The following portions of the chart were reviewed this encounter and updated as appropriate: medications, allergies, medical history  Review of Systems:  No other skin or systemic complaints except as noted in HPI or Assessment and Plan.  Objective  Well appearing patient in no apparent distress; mood and affect are within normal limits.  A full examination was performed including scalp, head, eyes, ears, nose, lips, neck, chest, axillae, abdomen, back, buttocks, bilateral upper extremities, bilateral lower extremities, hands, feet, fingers, toes, fingernails, and toenails. All findings within normal limits unless otherwise noted below.   Relevant physical exam findings are noted in the Assessment and Plan.  R lateral thigh x 1 Verrucous papules -- Discussed viral etiology and contagion.  R post ear crease x 1, L popliteal x 1, R inguinal groin x 1, L shoulder x 1, L lower flank x 1, L med breast x 1, L flank x 1, L forearm x 1 (8) Erythematous stuck-on, waxy papule or plaque  Assessment & Plan   SKIN CANCER SCREENING PERFORMED TODAY.  ACTINIC DAMAGE - Chronic condition, secondary to cumulative UV/sun exposure - diffuse scaly erythematous macules with underlying dyspigmentation - Recommend daily broad spectrum sunscreen SPF 30+ to sun-exposed areas, reapply every 2 hours as needed.  - Staying in the shade or wearing long sleeves, sun glasses (UVA+UVB protection) and wide brim hats (4-inch brim around the entire circumference of the hat) are  also recommended for sun protection.  - Call for new or changing lesions.  LENTIGINES, HEMANGIOMAS - Benign normal skin lesions - Benign-appearing - Call for any changes  SEBORRHEIC KERATOSIS - Stuck-on, waxy, tan-brown papules and/or plaques, including stucco keratosis at ankles/feet  - Benign-appearing - Discussed benign etiology and prognosis. - Observe - Call for any changes - Sample of CeraVe Rough & Bumpy, AmLactin, CeraVe Psoriasis  MELANOCYTIC NEVI - Tan-brown and/or pink-flesh-colored symmetric macules and papules - Benign appearing on exam today - Observation - Call clinic for new or changing moles - Recommend daily use of broad spectrum spf 30+ sunscreen to sun-exposed areas.   BLUE NEVUS Exam: 3mm dark gray blue macule at right hand dorsum  Benign-appearing.  Observation.  Call clinic for new or changing lesions.  Recommend daily use of broad spectrum spf 30+ sunscreen to sun-exposed areas.   PSORIATIC ARTHRITIS Patient treated with Skyrizi by rheumatologist. No skin involvement at this time. Continue annual skin checks  EXCORIATION Exam: Healing excoriations x 3 at crown  Treatment Plan: Recommend vaseline. Call if not resolving.  TELANGIECTASIA Exam: dilated blood vessels at chin, alar creases  Treatment Plan: Benign appearing on exam Call for changes Counseling for BBL / IPL / Laser and Coordination of Care Discussed the treatment option of Broad Band Light (BBL) /Intense Pulsed Light (IPL)/ Laser for skin discoloration, including brown spots and redness.  Typically we recommend at least 1-3 treatment sessions about 5-8 weeks apart for best results.  Cannot have tanned skin when BBL performed, and regular use of sunscreen/photoprotection is advised after the procedure  to help maintain results. The patient's condition may also require maintenance treatments in the future.  The fee for BBL / laser treatments is $350 per treatment session for the whole face.   A fee can be quoted for other parts of the body.  Insurance typically does not pay for BBL/laser treatments and therefore the fee is an out-of-pocket cost. Recommend prophylactic valtrex  treatment. Once scheduled for procedure, will send Rx in prior to patient's appointment.    VIRAL WARTS, UNSPECIFIED TYPE R lateral thigh x 1 vs Inflamed Seborrheic Keratosis  Viral Wart (HPV) Counseling  Discussed viral / HPV (Human Papilloma Virus) etiology and risk of spread /infectivity to other areas of body as well as to other people.  Multiple treatments and methods may be required to clear warts and it is possible treatment may not be successful.  Treatment risks include discoloration; scarring and there is still potential for wart recurrence. Destruction of lesion - R lateral thigh x 1  Destruction method: cryotherapy   Informed consent: discussed and consent obtained   Lesion destroyed using liquid nitrogen: Yes   Region frozen until ice ball extended beyond lesion: Yes   Outcome: patient tolerated procedure well with no complications   Post-procedure details: wound care instructions given   Additional details:  Prior to procedure, discussed risks of blister formation, small wound, skin dyspigmentation, or rare scar following cryotherapy. Recommend Vaseline ointment to treated areas while healing.  INFLAMED SEBORRHEIC KERATOSIS (8) R post ear crease x 1, L popliteal x 1, R inguinal groin x 1, L shoulder x 1, L lower flank x 1, L med breast x 1, L flank x 1, L forearm x 1 (8) Symptomatic, irritating, patient would like treated. Destruction of lesion - R post ear crease x 1, L popliteal x 1, R inguinal groin x 1, L shoulder x 1, L lower flank x 1, L med breast x 1, L flank x 1, L forearm x 1 (8)  Destruction method: cryotherapy   Informed consent: discussed and consent obtained   Lesion destroyed using liquid nitrogen: Yes   Region frozen until ice ball extended beyond lesion: Yes   Outcome:  patient tolerated procedure well with no complications   Post-procedure details: wound care instructions given   Additional details:  Prior to procedure, discussed risks of blister formation, small wound, skin dyspigmentation, or rare scar following cryotherapy. Recommend Vaseline ointment to treated areas while healing.  Return in about 1 year (around 05/12/2025) for TBSE, Hx AKs.  IAndrea Kerns, CMA, am acting as scribe for Rexene Rattler, MD .   Documentation: I have reviewed the above documentation for accuracy and completeness, and I agree with the above.  Rexene Rattler, MD

## 2024-05-17 NOTE — Progress Notes (Unsigned)
 Cardiology Office Note    Date:  05/20/2024   ID:  Shannon [name], DOB 01-26-1956, MRN [id number]  PCP:  Jimmy Charlie FERNS, MD  Cardiologist:  Lonni Hanson, MD  Electrophysiologist:  None   Chief Complaint: Follow-up  History of Present Illness:   Shannon [name] is a 68 y.o. female with history of nonobstructive CAD by coronary CTA, mild mitral regurgitation, aortic atherosclerosis, HTN, HLD, DM2, and GERD who presents for follow-up of coronary CTA and echo.  She was previously followed by Dr. Monette with evaluation in 2018 for nonexertional chest pain.  Echo at that time showed an EF of 55 to 60%, no regional wall motion abnormalities, mild LVH, grade 1 diastolic dysfunction, mitral valve with systolic bowing without prolapse, trivial mitral regurgitation, and normal RV systolic function.  Treadmill MPI in 11/2016 showed no significant ischemia with an EF of 66% and was overall low risk.  Due to persisting symptoms she underwent LHC in 11/2016 that showed minimal luminal irregularities affecting the LAD with no evidence of obstructive CAD and normal LV systolic function with mildly elevated LVEDP.  Echo from 03/2021 showed an EF of 50 to 55%, no regional wall motion abnormalities, grade 1 diastolic dysfunction, normal RV systolic function and ventricular cavity size, normal RVSP, mild mitral regurgitation, and an estimated right atrial pressure of 8 mmHg.  She was last seen in the office in 02/2024 noting a several month history of exertional fatigue, chest discomfort, and dyspnea.  She reported her hands and feet would become swollen if she forgot to take her furosemide .  Coronary CTA in 03/2024 showed a calcium  score of 156 which was the 85th percentile.  There was 25 to 49% proximal LAD stenosis.  Echo in 04/2024 showed an EF of 60 to 65%, no regional wall motion abnormalities, grade 1 diastolic dysfunction, normal RV systolic function and ventricular cavity size, mild mitral regurgitation, and an  estimated right atrial pressure of 3 mmHg.  She comes in today and is without symptoms of angina or cardiac decompensation.  She reports she was woken up around 4 AM with a headache and typically has 3 to 4 days of headaches per week.  She indicates she just generally does not feel well today.  She continues to note some swelling in her hands and feet if she does not take her furosemide .  Longstanding palpitations are largely stable.  No near syncope or syncope.  She continues to report generalized malaise and fatigue.  She asks about checking biomarkers for why she feels so poorly.   Labs independently reviewed: 02/2024 - BUN 18, serum creatinine 0.71, potassium 4.1, Hgb 13.5, PLT 294, A1c 8.1, TC 157, TG 142, HDL 61, LDL 67, TSH normal, albumin 4.4, AST/ALT normal   Past Medical History:  Diagnosis Date   Actinic keratosis    Allergy    Arthritis    hip, back, shoulders, joints   Asthma    Complication of anesthesia    problems with block during breast augmentation and epidural during childbirth   Depression    Diabetes mellitus 09/2011   type 2   Family history of adverse reaction to anesthesia    PROBLEMS WITH BLOCKS   Fatty liver    Fibromyalgia    GERD (gastroesophageal reflux disease)    Glaucoma suspect    Headache    migraines-4x a week headache-1x week migraine   History of hiatal hernia    Hyperlipidemia    Hypertension  Controlled on meds   Mitral valve prolapse    Nose colonized with MRSA 05/15/2018   Osteopenia    Pneumonia    PONV (postoperative nausea and vomiting)    Psoriasis    Shingles 11/2006   Sleep disturbance    Tachycardia    Vertigo    Hx of    Past Surgical History:  Procedure Laterality Date   ABDOMINAL HYSTERECTOMY  2000   /BSO - abn. [facility]   APPENDECTOMY  1978   AUGMENTATION MAMMAPLASTY Bilateral 1981   BLADDER REPAIR     x2   BREAST ENHANCEMENT SURGERY  1987   CARDIAC CATHETERIZATION     [facility]   CARDIAC CATHETERIZATION Left  12/18/2016   Procedure: Left Heart Cath and Coronary Angiography;  Surgeon: Deatrice DELENA Cage, MD;  Location: [facility];  Service: Cardiovascular;  Laterality: Left;   CARPAL TUNNEL RELEASE Bilateral    CERVICAL DISCECTOMY  11/2009   with synthetic discs inserted--  Dr Mavis   COLONOSCOPY     JOINT REPLACEMENT     Left Hip Replacement Left 02/01/2023   NM MYOVIEW  LTD  03/2005   EF 56%, no ischemia   RECTAL PROLAPSE REPAIR     x1   SHOULDER ARTHROSCOPY WITH OPEN ROTATOR CUFF REPAIR Left 12/29/2019   Procedure: LEFT SHOULDER ARTHROSCOPIC DISTAL CLAVICLE EXCISISON, SUBACROMIAL DECOMPRESSION,BICEPS TENOTOMY,ROTATOR CUFF REPAIR ;  Surgeon: Leora Lynwood SAUNDERS, MD;  Location: [facility] ORS;  Service: Orthopedics;  Laterality: Left;   SHOULDER CLOSED REDUCTION Left 05/03/2020   Procedure: Left shoulder arthroscopic lysis of adhesions and manipulation under anesthesia;  Surgeon: Leora Lynwood SAUNDERS, MD;  Location: [facility] OR[facility];  Service: Orthopedics;  Laterality: Left;   SHOULDER SURGERY  09/2008, 01/2009   Right shoulder surgery, then redone with lysis of adhesions   TOTAL HIP ARTHROPLASTY Right 05/29/2018   Procedure: TOTAL HIP ARTHROPLASTY ANTERIOR APPROACH;  Surgeon: Leora Lynwood SAUNDERS, MD;  Location: [facility] OR[facility];  Service: Orthopedics;  Laterality: Right;   TOTAL HIP ARTHROPLASTY Left 01/2023   UPPER GI ENDOSCOPY      Current Medications: Current Meds  Medication Sig   ALPRAZolam  (XANAX ) 0.25 MG tablet Take 1 tablet (0.25 mg total) by mouth 2 (two) times daily as needed for anxiety.   aspirin -acetaminophen -caffeine  (EXCEDRIN  MIGRAINE) 250-250-65 MG tablet Take 1 tablet by mouth every 6 (six) hours as needed for headache.   Blood Glucose Monitoring Suppl (FIFTY50 GLUCOSE METER 2.0) w/Device KIT    Blood Glucose Monitoring Suppl (ONETOUCH VERIO) w/Device KIT Use as advised   cetirizine (ZYRTEC) 10 MG tablet Take 10 mg by mouth daily.    clobetasol  (TEMOVATE ) 0.05 % external solution MIX IN JAR OF  CERAVE CREAM, THEN APPLY TO BACK TWICE DAILY UNTIL ITCH IMPROVED - AVOID FACE, GROIN, AND AXILLA   Continuous Glucose Sensor (DEXCOM G6 SENSOR) MISC CHANGE 1 SENSOR EVERY 10 DAYS AS DIRECTED   Continuous Glucose Transmitter (DEXCOM G6 TRANSMITTER) MISC USE 1 TRANSMITTER EVERY 3 MONTHS   docusate sodium  (COLACE) 100 MG capsule Take 300 mg by mouth at bedtime.    DULoxetine  (CYMBALTA ) 60 MG capsule TAKE 1 CAPSULE BY MOUTH DAILY   Fluocinolone  Acetonide 0.01 % OIL Apply 1-2 gtts to ears once to twice daily as needed until improved.   fluticasone  (FLONASE ) 50 MCG/ACT nasal spray Place 1 spray into both nostrils daily.   folic acid (FOLVITE) 1 MG tablet Take 1 mg by mouth daily.   furosemide  (LASIX ) 20 MG tablet TAKE 1 TABLET BY MOUTH DAILY  gabapentin  (NEURONTIN ) 100 MG capsule Take 100mg  twice a day and 2 at bedtime   Glucagon  3 MG/DOSE POWD Place 3 mg into the nose once as needed for up to 1 dose.   glucose blood (ONETOUCH ULTRA) test strip Use as instructed 2x a day   HUMULIN  R 500 UNIT/ML injection INJECT SUBCUTANEOUSLY UP TO 200  UNITS IN THE INSULIN  PUMP DAILY   HYDROcodone -acetaminophen  (NORCO/VICODIN) 5-325 MG tablet Take 1 tablet by mouth every 4 (four) hours as needed for moderate pain (pain score 4-6).   Insulin  Disposable Pump (OMNIPOD 5 DEXG7G6 INTRO GEN 5) KIT USE AS DIRECTED   Insulin  Disposable Pump (OMNIPOD 5 DEXG7G6 PODS GEN 5) MISC USE 1 POD EVERY OTHER DAY   JARDIANCE  10 MG TABS tablet TAKE 1 TABLET BY MOUTH DAILY  BEFORE BREAKFAST   LUMIGAN 0.01 % SOLN Place 1 drop into both eyes at bedtime.    methocarbamol (ROBAXIN) 750 MG tablet Take 1 tablet 3 times a day by oral route as needed. (Patient taking differently: 500 mg.)   methotrexate (RHEUMATREX) 2.5 MG tablet Take by mouth.   metoprolol  succinate (TOPROL -XL) 25 MG 24 hr tablet TAKE 1 AND 1/2 TABLETS BY MOUTH  DAILY   metoprolol  tartrate (LOPRESSOR ) 50 MG tablet TAKE BOTH TABLETS ABOUT 2 HR PRIOR TO CARDIAC PROCEDURE    mometasone  (ELOCON ) 0.1 % cream Apply 1 Application topically daily.   ondansetron  (ZOFRAN -ODT) 4 MG disintegrating tablet DISSOLVE 1 TABLET ON THE TONGUE  EVERY 6 HOURS AS NEEDED FOR  NAUSEA AND VOMITING   OneTouch Delica Lancets 33G MISC Use 2x a day   pantoprazole  (PROTONIX ) 40 MG tablet Take 1 tablet (40 mg total) by mouth 2 (two) times daily.   potassium chloride  SA (KLOR-CON  M) 20 MEQ tablet Take 1 tablet (20 mEq total) by mouth daily.   prochlorperazine  (COMPAZINE ) 10 MG tablet Take 1 tablet (10 mg total) by mouth every 6 (six) hours as needed.   rosuvastatin  (CRESTOR ) 10 MG tablet Take 1 tablet (10 mg total) by mouth daily. (Patient taking differently: Take 20 mg by mouth daily.)   SKYRIZI PEN 150 MG/ML pen Inject into the skin.   traZODone  (DESYREL ) 100 MG tablet TAKE 2 TABLETS BY MOUTH EVERY  NIGHT AT BEDTIME   triamterene -hydrochlorothiazide  (MAXZIDE -25) 37.5-25 MG tablet TAKE 1 TABLET BY MOUTH ONCE  DAILY   valACYclovir  (VALTREX ) 1000 MG tablet TAKE 2 TABLETS BY MOUTH AT ONSET OF SYMPTOMS AS NEEDED FOR FEVER  BLISTERS AND TAKE 2 TABLETS BY  MOUTH 12 HOURS LATER    Allergies:   Humalog [insulin  lispro], Lisinopril, Nitrofurantoin, Oxycodone -acetaminophen , Percocet [oxycodone -acetaminophen ], Atorvastatin , Clarithromycin, Glipizide , and Invokana  [canagliflozin ]   Social History   Socioeconomic History   Marital status: Married    Spouse name: Not on file   Number of children: 5   Years of education: Not on file   Highest education level: Not on file  Occupational History   Occupation: homemaker  Tobacco Use   Smoking status: Never    Passive exposure: Past   Smokeless tobacco: Never  Vaping Use   Vaping status: Never Used  Substance and Sexual Activity   Alcohol use: No   Drug use: No   Sexual activity: Not on file  Other Topics Concern   Not on file  Social History Narrative   Home maker   Social Drivers of Health   Financial Resource Strain: Not on file  Food  Insecurity: Not on file  Transportation Needs: Not on file  Physical  Activity: Not on file  Stress: Not on file  Social Connections: Not on file     Family History:  The patient's family history includes Breast cancer in her maternal grandmother; Cancer in her father; Coronary artery disease in her father and sister; Depression in her sister; Diabetes in her maternal grandfather, maternal grandmother, mother, and sister; Heart attack in her cousin, paternal uncle, and paternal uncle; Heart disease in her father, mother, and sister; Hypertension in her mother; Melanoma in her mother; Ovarian cancer in her mother; Pulmonary Hypertension in her sister; Rheum arthritis in her maternal aunt. There is no history of Colon cancer.  ROS:   12-point review of systems is negative unless otherwise noted in the HPI.   EKGs/Labs/Other Studies Reviewed:    Studies reviewed were summarized above. The additional studies were reviewed today:  2D echo 04/24/2024: 1. Left ventricular ejection fraction, by estimation, is 60 to 65%. The  left ventricle has normal function. The left ventricle has no regional  wall motion abnormalities. Left ventricular diastolic parameters are  consistent with Grade I diastolic  dysfunction (impaired relaxation). The average left ventricular global  longitudinal strain is -19.3 %. The global longitudinal strain is normal.   2. Right ventricular systolic function is normal. The right ventricular  size is normal.   3. The mitral valve is normal in structure. Mild mitral valve  regurgitation.   4. The aortic valve is tricuspid. Aortic valve regurgitation is not  visualized.   5. The inferior vena cava is normal in size with greater than 50%  respiratory variability, suggesting right atrial pressure of 3 mmHg.  __________  Coronary CTA 04/10/2024: FINDINGS: Aorta:  Normal size.  No calcifications.  No dissection.   Aortic Valve:  Trileaflet.  No calcifications.   Coronary  Arteries:  Normal coronary origin.  Right dominance.   RCA is a dominant artery. There is no plaque.   Left main gives rise to LAD and LCX arteries. LM has no disease.   LAD has calcified plaque proximally causing mild stenosis (25-49%).   LCX is a non-dominant artery.  There is no plaque.   Other findings:   Normal pulmonary vein drainage into the left atrium.   Normal left atrial appendage without a thrombus.   Normal size of the pulmonary artery.   IMPRESSION: 1. Coronary calcium  score of 156. This was 85th percentile for age and sex matched control.   2. Normal coronary origin with right dominance.   3. Mild proximal LAD stenosis (25-49%).   4. CAD-RADS 2. Mild non-obstructive CAD (25-49%). Consider preventive therapy and risk factor modification. __________  2D echo 03/22/2021: 1. Left ventricular ejection fraction, by estimation, is 50 to 55%. The  left ventricle has low normal function. The left ventricle has no regional  wall motion abnormalities. Left ventricular diastolic parameters are  consistent with Grade I diastolic  dysfunction (impaired relaxation).   2. Right ventricular systolic function is normal. The right ventricular  size is normal. There is normal pulmonary artery systolic pressure.   3. The mitral valve is normal in structure. Mild mitral valve  regurgitation.   4. The aortic valve is tricuspid. Aortic valve regurgitation is not  visualized. No aortic stenosis is present.   5. The inferior vena cava is normal in size with <50% respiratory  variability, suggesting right atrial pressure of 8 mmHg.   Comparison(s): EF 55-60%, mild thickening of MV leaflets, no MVP seen only  bowing.  __________   [facility] 12/18/2016:  The left ventricular systolic function is normal. LV end diastolic pressure is mildly elevated. The left ventricular ejection fraction is 55-65% by visual estimate.   1. Minimal luminal irregularities affecting the LAD. No evidence of  obstructive coronary artery disease. 2. Normal LV systolic function. Mildly elevated left ventricular end-diastolic pressure.   Recommendations: The chest pain seems to be noncardiac. __________   Treadmill MPI 12/05/2016: Exercise myocardial perfusion imaging study with no significant ischemia Normal wall motion, EF estimated at 66% No EKG changes concerning for ischemia at peak stress or in recovery. Target heart rate achieved Low risk scan __________   2D echo 11/29/2016: - Left ventricle: The cavity size was normal. Wall thickness was    increased in a pattern of mild LVH. Systolic function was normal.    The estimated ejection fraction was in the range of 55% to 60%.    Wall motion was normal; there were no regional wall motion    abnormalities. Doppler parameters are consistent with abnormal    left ventricular relaxation (grade 1 diastolic dysfunction).  - Mitral valve: Systolic bowing without prolapse. There was trivial    regurgitation.  - Right ventricle: The cavity size was normal. Wall thickness was    normal. Systolic function was normal.  __________   2D echo 02/06/2013: - Left ventricle: The cavity size was normal. Wall thickness    was normal. Systolic function was normal. The estimated    ejection fraction was in the range of 50% to 55%. Wall    motion was normal; there were no regional wall motion    abnormalities. Left ventricular diastolic function    parameters were normal.  - Mitral valve: Mild to moderate regurgitation.  - Right ventricle: Systolic function was normal.  - Tricuspid valve: Mild-moderate regurgitation.  - Pulmonary arteries: Systolic pressure is mildly elevated    consistent with mild pulmonary HTN. PA peak pressure: 35mm    Hg (S).    EKG:  EKG is ordered today.  The EKG ordered today demonstrates sinus tachycardia, 108 bpm, no acute ST-T changes  Recent Labs: 03/11/2024: ALT 23; Hemoglobin 13.5; Platelets 294.0; TSH 0.70 03/17/2024:  BUN 18; Creatinine, Ser 0.71; Potassium 4.1; Sodium 144  Recent Lipid Panel    Component Value Date/Time   CHOL 157 03/11/2024 1214   CHOL 231 (H) 12/14/2016 1131   TRIG 142.0 03/11/2024 1214   HDL 61.30 03/11/2024 1214   HDL 59 12/14/2016 1131   CHOLHDL 3 03/11/2024 1214   VLDL 28.4 03/11/2024 1214   LDLCALC 67 03/11/2024 1214   LDLCALC 126 (H) 12/14/2016 1131   LDLDIRECT 116.0 03/12/2023 1456    PHYSICAL EXAM:    VS:  BP (!) 140/64 (BP Location: Left Arm, Patient Position: Sitting, Cuff Size: Normal)   Pulse (!) 108   Ht 5' 4 (1.626 m)   Wt 182 lb 6.4 oz (82.7 kg)   SpO2 96%   BMI 31.31 kg/m   BMI: Body mass index is 31.31 kg/m.  Physical Exam Vitals reviewed.  Constitutional:      Appearance: She is well-developed.  HENT:     Head: Normocephalic and atraumatic.   Eyes:     General:        Right eye: No discharge.        Left eye: No discharge.    Cardiovascular:     Rate and Rhythm: Normal rate and regular rhythm.     Heart sounds: Normal heart sounds, S1 normal and S2 normal. Heart  sounds not distant. No midsystolic click and no opening snap. No murmur heard.    No friction rub.  Pulmonary:     Effort: Pulmonary effort is normal. No respiratory distress.     Breath sounds: Normal breath sounds. No decreased breath sounds, wheezing, rhonchi or rales.  Chest:     Chest wall: No tenderness.   Musculoskeletal:     Cervical back: Normal range of motion.   Skin:    General: Skin is warm and dry.     Nails: There is no clubbing.   Neurological:     Mental Status: She is alert and oriented to person, place, and time.   Psychiatric:        Speech: Speech normal.        Behavior: Behavior normal.        Thought Content: Thought content normal.        Judgment: Judgment normal.     Wt Readings from Last 3 Encounters:  05/20/24 182 lb 6.4 oz (82.7 kg)  03/19/24 175 lb 6.4 oz (79.6 kg)  03/17/24 177 lb 6.4 oz (80.5 kg)     ASSESSMENT & PLAN:    Nonobstructive CAD: No symptoms suggestive of angina or cardiac decompensation.  Coronary CTA in 03/2024 showed nonobstructive CAD with 25 to 49% LAD stenosis.  Recommend aggressive risk factor modification and primary prevention including aspirin  81 mg and rosuvastatin  10 mg.  Mitral regurgitation: Prior echo has demonstrated mitral valve systolic bowing without prolapse in 2018 with trivial regurgitation.  Mild regurgitation by echo in 2022.  Most recent echo from 04/2024 showed mild mitral regurgitation.  Monitor with periodic echo.  HTN: Blood pressure is mildly elevated in the office today, likely in the setting of headache.  No changes were pursued in pharmacotherapy at this time.  I do suspect her finger swelling is related to sodium intake and possible vasodilatation in the hot temperatures.  I do not suspect this is primary cardiac in etiology and would not aggressively diurese.  Aortic atherosclerosis/HLD: LDL 67 in 02/2024 with normal AST/ALT at that time.  She remains on rosuvastatin  10 mg.  Palpitations: Longstanding and largely stable.  Remains on Toprol -XL 37.5 mg daily.  Generalized malaise and fatigue: Does not appear to be primary cardiac in etiology.  Recent labs including Hgb and TSH normal.  Encouraged her to follow-up with PCP to discuss if further testing is indicated.  Headache disorder: No red flag symptoms.  Follow-up with PCP.      Disposition: F/u with Dr. Mady in 6 months.   Medication Adjustments/Labs and Tests Ordered: Current medicines are reviewed at length with the patient today.  Concerns regarding medicines are outlined above. Medication changes, Labs and Tests ordered today are summarized above and listed in the Patient Instructions accessible in Encounters.   Signed, Bernardino Bring, PA-C 05/20/2024 4:53 PM     [facility] [place] [address] [place], [place] [address] [phone]

## 2024-05-20 ENCOUNTER — Ambulatory Visit: Attending: Physician Assistant | Admitting: Physician Assistant

## 2024-05-20 ENCOUNTER — Ambulatory Visit: Payer: Self-pay

## 2024-05-20 ENCOUNTER — Encounter: Payer: Self-pay | Admitting: Physician Assistant

## 2024-05-20 VITALS — BP 140/64 | HR 108 | Ht 64.0 in | Wt 182.4 lb

## 2024-05-20 DIAGNOSIS — I340 Nonrheumatic mitral (valve) insufficiency: Secondary | ICD-10-CM

## 2024-05-20 DIAGNOSIS — I10 Essential (primary) hypertension: Secondary | ICD-10-CM | POA: Diagnosis not present

## 2024-05-20 DIAGNOSIS — E785 Hyperlipidemia, unspecified: Secondary | ICD-10-CM

## 2024-05-20 DIAGNOSIS — I2510 Atherosclerotic heart disease of native coronary artery without angina pectoris: Secondary | ICD-10-CM | POA: Diagnosis not present

## 2024-05-20 DIAGNOSIS — I700 Atherosclerosis of aorta: Secondary | ICD-10-CM | POA: Diagnosis not present

## 2024-05-20 DIAGNOSIS — R5383 Other fatigue: Secondary | ICD-10-CM

## 2024-05-20 DIAGNOSIS — R002 Palpitations: Secondary | ICD-10-CM

## 2024-05-20 NOTE — Patient Instructions (Signed)
 Medication Instructions:  Your physician recommends that you continue on your current medications as directed. Please refer to the Current Medication list given to you today.   *If you need a refill on your cardiac medications before your next appointment, please call your pharmacy*  Lab Work: None ordered at this time   Follow-Up: At [facility], you and your health needs are our priority.  As part of our continuing mission to provide you with exceptional heart care, our providers are all part of one team.  This team includes your primary Cardiologist (physician) and Advanced Practice Providers or APPs (Physician Assistants and Nurse Practitioners) who all work together to provide you with the care you need, when you need it.  Your next appointment:   6 month(s)  Provider:   Dr Charlie Denise, PCP  You may see Lonni Hanson, MD or one of the following Advanced Practice Providers on your designated Care Team:   Lonni Meager, NP Lesley Maffucci, PA-C Bernardino Bring, PA-C Cadence Franchester, PA-C Tylene Lunch, NP Barnie Hila, NP

## 2024-05-20 NOTE — Telephone Encounter (Signed)
 FYI Only or Action Required?: FYI only for provider.  Patient was last seen in primary care on 03/11/2024 by Jimmy Charlie FERNS, MD. Called Nurse Triage reporting Leg Swelling. Symptoms began x 3 days. Interventions attempted: Rest, hydration, or home remedies. Symptoms are: gradually worsening.  Triage Disposition: See PCP When Office is Open (Within 3 Days)  Patient/caregiver understands and will follow disposition?:     Copied from CRM [id number]. Topic: Clinical - Red Word Triage >> May 20, 2024  4:30 PM Burnard DEL wrote: Red Word that prompted transfer to Nurse Triage: retaining fluid,swelling in face ,hands,legs   Reason for Disposition  [1] MILD swelling of both ankles (i.e., pedal edema) AND [2] new-onset or worsening  [1] MILD swelling (puffiness) of both hands AND [2] not better after 3 days  Answer Assessment - Initial Assessment Questions 1. ONSET: When did the swelling start? (e.g., minutes, hours, days)    X couple of weeks & worsening X 3 days 2. LOCATION: What part of the face is swollen?     Bilateral leg, bilateral hands and face swelling around eyes and fullness of face  3. SEVERITY: How swollen is it?     Mild to moderate 4. ITCHING: Is there any itching? If Yes, ask: How much?   (Scale 1-10; mild, moderate or severe)     no 5. PAIN: Is the swelling painful to touch? If Yes, ask: How painful is it?   (Scale 1-10; mild, moderate or severe)   - NONE (0): no pain   - MILD (1-3): doesn't interfere with normal activities    - MODERATE (4-7): interferes with normal activities or awakens from sleep    - SEVERE (8-10): excruciating pain, unable to do any normal activities      na 6. FEVER: Do you have a fever? If Yes, ask: What is it, how was it measured, and when did it start?      no 7. CAUSE: What do you think is causing the face swelling?     Fluid retention 8. RECURRENT SYMPTOM: Have you had face swelling before? If Yes, ask: When was the last  time? What happened that time?     no 9. OTHER SYMPTOMS: Do you have any other symptoms? (e.g., toothache, leg swelling)     Bilateral leg, bilateral hands and face swelling around eyes and fullness of face 10. PREGNANCY: Is there any chance you are pregnant? When was your last menstrual period?       N/a  Urine output is low. Pt states drinking plenty of water.  Answer Assessment - Initial Assessment Questions 1. ONSET: When did the swelling start? (e.g., minutes, hours, days)     X couple weeks and worsening x 3 days 2. LOCATION: What part of the leg is swollen?  Are both legs swollen or just one leg?     Bilateral leg, bilateral hands and face swelling around eyes and fullness of face 3. SEVERITY: How bad is the swelling? (e.g., localized; mild, moderate, severe)   - Localized: Small area of swelling localized to one leg.   - MILD pedal edema: Swelling limited to foot and ankle, pitting edema < 1/4 inch (6 mm) deep, rest and elevation eliminate most or all swelling.   - MODERATE edema: Swelling of lower leg to knee, pitting edema > 1/4 inch (6 mm) deep, rest and elevation only partially reduce swelling.   - SEVERE edema: Swelling extends above knee, facial or hand swelling present.  moderate 4. REDNESS: Does the swelling look red or infected?     Redness at times 5. PAIN: Is the swelling painful to touch? If Yes, ask: How painful is it?   (Scale 1-10; mild, moderate or severe)     mild 6. FEVER: Do you have a fever? If Yes, ask: What is it, how was it measured, and when did it start?      no 7. CAUSE: What do you think is causing the leg swelling?     unknown 8. MEDICAL HISTORY: Do you have a history of blood clots (e.g., DVT), cancer, heart failure, kidney disease, or liver failure?     N/a 9. RECURRENT SYMPTOM: Have you had leg swelling before? If Yes, ask: When was the last time? What happened that time?     no 10. OTHER SYMPTOMS: Do you  have any other symptoms? (e.g., chest pain, difficulty breathing)       Increased HR at times, SOB at times 11. PREGNANCY: Is there any chance you are pregnant? When was your last menstrual period?       N/a  Answer Assessment - Initial Assessment Questions 1. ONSET: When did the swelling start? (e.g., minutes, hours, days)     X couple of weeks and worsening x 3 days 2. LOCATION: What part of the hand is swollen?  Are both hands swollen or just one hand?     Bilateral leg, bilateral hands and face swelling around eyes and fullness of face 3. SEVERITY: How bad is the swelling? (e.g., localized; mild, moderate, severe)   - BALL OR LUMP: small ball or lump   - LOCALIZED: puffy or swollen area or patch of skin   - JOINT SWELLING: swelling of a joint   - MILD: puffiness or mild swelling of fingers or hand   - MODERATE: fingers and hand are swollen   - SEVERE: swelling of entire hand and up into forearm     Mild to moderate 4. REDNESS: Does the swelling look red or infected?     Redness at times 5. PAIN: Is the swelling painful to touch? If Yes, ask: How painful is it?   (Scale 1-10; mild, moderate or severe)     mild 6. FEVER: Do you have a fever? If Yes, ask: What is it, how was it measured, and when did it start?      no 7. CAUSE: What do you think is causing the hand swelling? (e.g., heat, insect bite, pregnancy, recent injury)     unknownn 8. MEDICAL HISTORY: Do you have a history of heart failure, kidney disease, liver failure, or cancer?     N/a 9. RECURRENT SYMPTOM: Have you had hand swelling before? If Yes, ask: When was the last time? What happened that time?     na 10. OTHER SYMPTOMS: Do you have any other symptoms? (e.g., blurred vision, difficulty breathing, headache)       SOB at times 11. PREGNANCY: Is there any chance you are pregnant? When was your last menstrual period?       N/a  Protocols used: Leg Swelling and Edema-A-AH, Face  Swelling-A-AH, Hand Swelling-A-AH

## 2024-05-21 ENCOUNTER — Encounter: Payer: Self-pay | Admitting: Internal Medicine

## 2024-05-21 ENCOUNTER — Encounter: Payer: Self-pay | Admitting: Clinical

## 2024-05-21 ENCOUNTER — Ambulatory Visit: Payer: Self-pay | Admitting: Internal Medicine

## 2024-05-21 ENCOUNTER — Ambulatory Visit (INDEPENDENT_AMBULATORY_CARE_PROVIDER_SITE_OTHER): Admitting: Clinical

## 2024-05-21 ENCOUNTER — Ambulatory Visit: Admitting: Internal Medicine

## 2024-05-21 VITALS — BP 122/60 | HR 88 | Temp 98.4°F | Ht 64.0 in | Wt 181.0 lb

## 2024-05-21 DIAGNOSIS — E010 Iodine-deficiency related diffuse (endemic) goiter: Secondary | ICD-10-CM | POA: Diagnosis not present

## 2024-05-21 DIAGNOSIS — R220 Localized swelling, mass and lump, head: Secondary | ICD-10-CM | POA: Insufficient documentation

## 2024-05-21 DIAGNOSIS — F331 Major depressive disorder, recurrent, moderate: Secondary | ICD-10-CM | POA: Diagnosis not present

## 2024-05-21 DIAGNOSIS — E1149 Type 2 diabetes mellitus with other diabetic neurological complication: Secondary | ICD-10-CM

## 2024-05-21 LAB — CBC
HCT: 38.7 % (ref 36.0–46.0)
Hemoglobin: 12.4 g/dL (ref 12.0–15.0)
MCHC: 32 g/dL (ref 30.0–36.0)
MCV: 86.7 fl (ref 78.0–100.0)
Platelets: 259 10*3/uL (ref 150.0–400.0)
RBC: 4.46 Mil/uL (ref 3.87–5.11)
RDW: 18.2 % — ABNORMAL HIGH (ref 11.5–15.5)
WBC: 7.7 10*3/uL (ref 4.0–10.5)

## 2024-05-21 LAB — POC URINALSYSI DIPSTICK (AUTOMATED)
Bilirubin, UA: NEGATIVE
Blood, UA: NEGATIVE
Glucose, UA: POSITIVE — AB
Ketones, UA: NEGATIVE
Leukocytes, UA: NEGATIVE
Nitrite, UA: NEGATIVE
Protein, UA: POSITIVE — AB
Spec Grav, UA: 1.015 (ref 1.010–1.025)
Urobilinogen, UA: 0.2 U/dL
pH, UA: 6 (ref 5.0–8.0)

## 2024-05-21 LAB — RENAL FUNCTION PANEL
Albumin: 4.2 g/dL (ref 3.5–5.2)
BUN: 15 mg/dL (ref 6–23)
CO2: 30 meq/L (ref 19–32)
Calcium: 9.4 mg/dL (ref 8.4–10.5)
Chloride: 104 meq/L (ref 96–112)
Creatinine, Ser: 0.81 mg/dL (ref 0.40–1.20)
GFR: 75.72 mL/min (ref >60.00)
Glucose, Bld: 114 mg/dL — ABNORMAL HIGH (ref 70–99)
Phosphorus: 3.3 mg/dL (ref 2.3–4.6)
Potassium: 4.1 meq/L (ref 3.5–5.1)
Sodium: 141 meq/L (ref 135–145)

## 2024-05-21 LAB — HEPATIC FUNCTION PANEL
ALT: 23 U/L (ref 0–35)
AST: 23 U/L (ref 0–37)
Albumin: 4.2 g/dL (ref 3.5–5.2)
Alkaline Phosphatase: 88 U/L (ref 39–117)
Bilirubin, Direct: 0.1 mg/dL (ref 0.0–0.3)
Total Bilirubin: 0.4 mg/dL (ref 0.2–1.2)
Total Protein: 6.4 g/dL (ref 6.0–8.3)

## 2024-05-21 LAB — TSH: TSH: 1.03 u[IU]/mL (ref 0.35–5.50)

## 2024-05-21 LAB — T4, FREE: Free T4: 0.77 ng/dL (ref 0.60–1.60)

## 2024-05-21 NOTE — Progress Notes (Signed)
 [facility] Counselor/Therapist Progress Note  Patient ID: Shannon [name], MRN: [id number]    Date: 05/21/24  Time Spent: 1:39 pm - 2:43 pm : 64 Minutes  Treatment Type: Individual Therapy.  Reported Symptoms: Patient reported fluctuates in mood  Mental Status Exam: Appearance:  Neat and Well Groomed     Behavior: Appropriate  Motor: Normal  Speech/Language:  Clear and Coherent and Normal Rate  Affect: Appropriate  Mood: normal  Thought process: normal  Thought content:   WNL  Sensory/Perceptual disturbances:   WNL  Orientation: oriented to person, place, time/date, and situation  Attention: Good  Concentration: Good  Memory: WNL  Fund of knowledge:  Good  Insight:   Good  Judgment:  Good  Impulse Control: Good   Risk Assessment: Danger to Self:  No Patient denied current suicidal ideation  Self-injurious Behavior: No Danger to Others: No Patient denied current homicidal ideation Duty to Warn:no Physical Aggression / Violence:No  Access to Firearms a concern: No  Gang Involvement:No   Subjective:  Patient stated, not a lot of changes, I've just tried to force myself to be as busy as possible, pretty much the same in response to events since last session. Patient stated, maybe a little improvement but still some high and lows, the lows haven't been quite as bad in response to mood since last session. Patient stated, just kind of not real energetic, not feeling quite up to par today, mood wise I'm feeling ok. Patient stated, we (patient/husband) are distance far more than we use to be in reference to relationship with husband. Patient reported patient has asked husband about participation in marriage counseling and husband was uncertain. Patient reported she would like to receive affection from husband and stated, its a big impact on mood. Patient stated, I feel like the situation in my marriage is a huge block.  Patient stated,  I'm fine with  that in response to participation in therapy. Patient stated, I feel like I'm coming to the point where I have accepted that my loved ones are gone in reference to grief. Patient stated, I'm willing to do that in response to consultation with a psychiatrist. Patient stated, I want to get my happiness back, I want to get my marriage back in response to goals for therapy. Patient stated, I find myself thinking a lot about time is passing me by. Patient reported patient would like to increase patient's emotional connection with husband.   Interventions: Motivational Interviewing. Clinician conducted session in person at clinician's office at [facility]. Reviewed events since last session and assessed for changes. Clinician reviewed diagnosis and treatment recommendations. Provided psycho education related to diagnosis and treatment. Clinician utilized motivational interviewing to explore potential goals for therapy. Clinician utilized a task centered approach in collaboration with patient to begin to develop goals for therapy. Patient participated in development of the following goal and agreed to the following goal for therapy. Clinician requested for homework patient consider additional goals for therapy. Additional goals to be developed during follow up appointment on 06/19/24.  Collaboration of Care: Other not required at this time  Diagnosis:  Major depressive disorder, recurrent episode, moderate (HCC)   Plan: Patient is to utilize [employer] [facility], thought re-framing, mindfulness and coping strategies to decrease symptoms associated with their diagnosis. Frequency: bi-weekly  Modality: individual     Long-term goal:   Reduce overall level, frequency, and intensity of the feelings of depression as evidenced by decrease in grief, loss of  interest, depressed mood, isolation, increased sleep, low energy, psychomotor retardation, fatigue, feelings of guilt, lack of  concentration, loss of motivation from 7 days/week to 0 to 1 days/week per patient report for at least 3 consecutive months. Target Date: 05/21/25  Progress: established 05/21/24   Short-term goal:  To be determined during follow up session on 06/19/24                       [name], LCSW

## 2024-05-21 NOTE — Assessment & Plan Note (Signed)
 No sig proteinuria--so not nephrotic Will check labs

## 2024-05-21 NOTE — Addendum Note (Signed)
 Addended by: KALLIE CLOTILDA SQUIBB on: 05/21/2024 01:04 PM   Modules accepted: Orders

## 2024-05-21 NOTE — Assessment & Plan Note (Signed)
 Will check labs and an ultrasound

## 2024-05-21 NOTE — Assessment & Plan Note (Signed)
 Has been reasonably controlled

## 2024-05-21 NOTE — Progress Notes (Signed)
 Subjective:    Patient ID: Shannon [name], female    DOB: 1956/01/05, 68 y.o.   MRN: [id number]  HPI Here with several concerns--swelling, rash, palpitations  Having swelling in face, hands, less so in legs Eyelids so swollen in morning--hard to see at first Not peeing as much Heart rate 124 at cardiologist at first (yesterday)--came down some Told to come here No change in urine appearance  SOB and head felt funny all day yesterday--but not today  Red blotchy rash on ankles if on them for a while---then elevation and itch cream will help  Current Outpatient Medications on File Prior to Visit  Medication Sig Dispense Refill   ALPRAZolam  (XANAX ) 0.25 MG tablet Take 1 tablet (0.25 mg total) by mouth 2 (two) times daily as needed for anxiety. 20 tablet 0   aspirin -acetaminophen -caffeine  (EXCEDRIN  MIGRAINE) 250-250-65 MG tablet Take 1 tablet by mouth every 6 (six) hours as needed for headache.     Blood Glucose Monitoring Suppl (FIFTY50 GLUCOSE METER 2.0) w/Device KIT      Blood Glucose Monitoring Suppl (ONETOUCH VERIO) w/Device KIT Use as advised 1 kit 1   cetirizine (ZYRTEC) 10 MG tablet Take 10 mg by mouth daily.      clobetasol  (TEMOVATE ) 0.05 % external solution MIX IN JAR OF CERAVE CREAM, THEN APPLY TO BACK TWICE DAILY UNTIL ITCH IMPROVED - AVOID FACE, GROIN, AND AXILLA 50 mL 1   Continuous Glucose Sensor (DEXCOM G6 SENSOR) MISC CHANGE 1 SENSOR EVERY 10 DAYS AS DIRECTED 9 each 3   Continuous Glucose Transmitter (DEXCOM G6 TRANSMITTER) MISC USE 1 TRANSMITTER EVERY 3 MONTHS 1 each 3   docusate sodium  (COLACE) 100 MG capsule Take 300 mg by mouth at bedtime.      DULoxetine  (CYMBALTA ) 60 MG capsule TAKE 1 CAPSULE BY MOUTH DAILY 90 capsule 3   Fluocinolone  Acetonide 0.01 % OIL Apply 1-2 gtts to ears once to twice daily as needed until improved. 20 mL 2   fluticasone  (FLONASE ) 50 MCG/ACT nasal spray Place 1 spray into both nostrils daily.     folic acid (FOLVITE) 1 MG tablet Take 1 mg  by mouth daily.     furosemide  (LASIX ) 20 MG tablet TAKE 1 TABLET BY MOUTH DAILY 90 tablet 3   gabapentin  (NEURONTIN ) 100 MG capsule Take 100mg  twice a day and 2 at bedtime 360 capsule 3   Glucagon  3 MG/DOSE POWD Place 3 mg into the nose once as needed for up to 1 dose. 1 each 11   glucose blood (ONETOUCH ULTRA) test strip Use as instructed 2x a day 100 strip 11   HUMULIN  R 500 UNIT/ML injection INJECT SUBCUTANEOUSLY UP TO 200  UNITS IN THE INSULIN  PUMP DAILY 60 mL 3   HYDROcodone -acetaminophen  (NORCO/VICODIN) 5-325 MG tablet Take 1 tablet by mouth every 4 (four) hours as needed for moderate pain (pain score 4-6). 60 tablet 0   Insulin  Disposable Pump (OMNIPOD 5 DEXG7G6 INTRO GEN 5) KIT USE AS DIRECTED 1 kit 0   Insulin  Disposable Pump (OMNIPOD 5 DEXG7G6 PODS GEN 5) MISC USE 1 POD EVERY OTHER DAY 45 each 3   JARDIANCE  10 MG TABS tablet TAKE 1 TABLET BY MOUTH DAILY  BEFORE BREAKFAST 90 tablet 3   LUMIGAN 0.01 % SOLN Place 1 drop into both eyes at bedtime.      methocarbamol (ROBAXIN) 500 MG tablet Take 1 tablet every 8 hours by oral route as needed.     methotrexate (RHEUMATREX) 2.5 MG tablet Take by  mouth.     metoprolol  succinate (TOPROL -XL) 25 MG 24 hr tablet TAKE 1 AND 1/2 TABLETS BY MOUTH  DAILY 135 tablet 3   mometasone  (ELOCON ) 0.1 % cream Apply 1 Application topically daily.     ondansetron  (ZOFRAN -ODT) 4 MG disintegrating tablet DISSOLVE 1 TABLET ON THE TONGUE  EVERY 6 HOURS AS NEEDED FOR  NAUSEA AND VOMITING 180 tablet 0   OneTouch Delica Lancets 33G MISC Use 2x a day 100 each 11   pantoprazole  (PROTONIX ) 40 MG tablet Take 1 tablet (40 mg total) by mouth 2 (two) times daily. 180 tablet 3   potassium chloride  SA (KLOR-CON  M) 20 MEQ tablet Take 1 tablet (20 mEq total) by mouth daily. 90 tablet 3   prochlorperazine  (COMPAZINE ) 10 MG tablet Take 1 tablet (10 mg total) by mouth every 6 (six) hours as needed. 12 tablet 0   rosuvastatin  (CRESTOR ) 10 MG tablet Take 1 tablet (10 mg total) by  mouth daily. (Patient taking differently: Take 20 mg by mouth daily.) 90 tablet 3   SKYRIZI PEN 150 MG/ML pen Inject into the skin.     traZODone  (DESYREL ) 100 MG tablet TAKE 2 TABLETS BY MOUTH EVERY  NIGHT AT BEDTIME 180 tablet 3   triamterene -hydrochlorothiazide  (MAXZIDE -25) 37.5-25 MG tablet TAKE 1 TABLET BY MOUTH ONCE  DAILY 90 tablet 3   valACYclovir  (VALTREX ) 1000 MG tablet TAKE 2 TABLETS BY MOUTH AT ONSET OF SYMPTOMS AS NEEDED FOR FEVER  BLISTERS AND TAKE 2 TABLETS BY  MOUTH 12 HOURS LATER 90 tablet 3   No current facility-administered medications on file prior to visit.    Allergies  Allergen Reactions   Humalog [Insulin  Lispro] Swelling   Lisinopril Cough   Nitrofurantoin Other (See Comments)    Pt unsure   Oxycodone -Acetaminophen  Itching   Percocet [Oxycodone -Acetaminophen ]    Atorvastatin  Diarrhea    May have caused colitis--but not clear cut   Clarithromycin Nausea Only   Glipizide  Other (See Comments)    Sugars too variable   Invokana  [Canagliflozin ]     Nausea, constipation    Past Medical History:  Diagnosis Date   Actinic keratosis    Allergy    Arthritis    hip, back, shoulders, joints   Asthma    Complication of anesthesia    problems with block during breast augmentation and epidural during childbirth   Depression    Diabetes mellitus 09/2011   type 2   Family history of adverse reaction to anesthesia    PROBLEMS WITH BLOCKS   Fatty liver    Fibromyalgia    GERD (gastroesophageal reflux disease)    Glaucoma suspect    Headache    migraines-4x a week headache-1x week migraine   History of hiatal hernia    Hyperlipidemia    Hypertension    Controlled on meds   Mitral valve prolapse    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 Paps   APPENDECTOMY  1978   AUGMENTATION MAMMAPLASTY Bilateral 1981   BLADDER REPAIR     x2   BREAST ENHANCEMENT SURGERY  1987   CARDIAC CATHETERIZATION     [facility]   CARDIAC  CATHETERIZATION Left 12/18/2016   Procedure: Left Heart Cath and Coronary Angiography;  Surgeon: Deatrice DELENA Cage, MD;  Location: [facility] INV[facility]IVE CV [facility];  Service: Cardiovascular;  Laterality: Left;   CARPAL TUNNEL RELEASE Bilateral    CERVICAL DISCECTOMY  11/2009   with synthetic discs inserted--  Dr Mavis   COLONOSCOPY     JOINT REPLACEMENT     Left Hip Replacement Left 02/01/2023   NM MYOVIEW  LTD  03/2005   EF 56%, no ischemia   RECTAL PROLAPSE REPAIR     x1   SHOULDER ARTHROSCOPY WITH OPEN ROTATOR CUFF REPAIR Left 12/29/2019   Procedure: LEFT SHOULDER ARTHROSCOPIC DISTAL CLAVICLE EXCISISON, SUBACROMIAL DECOMPRESSION,BICEPS TENOTOMY,ROTATOR CUFF REPAIR ;  Surgeon: Leora Lynwood SAUNDERS, MD;  Location: [facility] ORS;  Service: Orthopedics;  Laterality: Left;   SHOULDER CLOSED REDUCTION Left 05/03/2020   Procedure: Left shoulder arthroscopic lysis of adhesions and manipulation under anesthesia;  Surgeon: Leora Lynwood SAUNDERS, MD;  Location: [facility] OR[facility];  Service: Orthopedics;  Laterality: Left;   SHOULDER SURGERY  09/2008, 01/2009   Right shoulder surgery, then redone with lysis of adhesions   TOTAL HIP ARTHROPLASTY Right 05/29/2018   Procedure: TOTAL HIP ARTHROPLASTY ANTERIOR APPROACH;  Surgeon: Leora Lynwood SAUNDERS, MD;  Location: [facility] OR[facility];  Service: Orthopedics;  Laterality: Right;   TOTAL HIP ARTHROPLASTY Left 01/2023   UPPER GI ENDOSCOPY      Family History  Problem Relation Age of Onset   Diabetes Mother    Heart disease Mother    Hypertension Mother    Ovarian cancer Mother    Melanoma Mother    Cancer Father    Coronary artery disease Father    Heart disease Father    Diabetes Sister    Depression Sister    Coronary artery disease Sister    Heart disease Sister    Pulmonary  Hypertension Sister    Rheum arthritis Maternal Aunt    Heart attack Paternal Uncle    Heart attack Paternal Uncle    Breast cancer Maternal Grandmother    Diabetes Maternal Grandmother    Diabetes Maternal Grandfather    Heart attack Cousin    Colon cancer Neg Hx     Social History   Socioeconomic History   Marital status: Married    Spouse name: Not on file   Number of children: 5   Years of education: Not on file   Highest education level: Not on file  Occupational History   Occupation: homemaker  Tobacco Use   Smoking status: Never    Passive exposure: Past   Smokeless tobacco: Never  Vaping Use   Vaping status: Never Used  Substance and Sexual Activity   Alcohol use: No   Drug use: No   Sexual activity: Not on file  Other Topics Concern   Not on file  Social History Narrative   Home maker   Social Drivers of Health   Financial Resource Strain: Not on file  Food Insecurity: Not on file  Transportation Needs: Not on file  Physical Activity: Not on file  Stress: Not on file  Social Connections: Not on file  Intimate Partner Violence: Not on file   Review of Systems Weight has gone up quickly No fever     Objective:   Physical Exam Constitutional:      General: She is not in acute distress. HENT:     Head:     Comments: Facial swelling and some periorbital swelling No rash there Neck:  Comments: Fairly symmetric enlarged thyroid  Not clearly nodular Cardiovascular:     Rate and Rhythm: Normal rate and regular rhythm.     Heart sounds: No murmur heard.    No gallop.  Pulmonary:     Effort: Pulmonary effort is normal.     Breath sounds: Normal breath sounds. No wheezing or rales.  Abdominal:     Palpations: Abdomen is soft.     Tenderness: There is no abdominal tenderness.  Musculoskeletal:     Cervical back: Neck supple.     Right lower leg: No edema.     Left lower leg: No edema.  Neurological:     Mental Status: She is alert.             Assessment & Plan:

## 2024-06-02 ENCOUNTER — Ambulatory Visit: Payer: Self-pay

## 2024-06-02 ENCOUNTER — Encounter: Payer: Self-pay | Admitting: Family Medicine

## 2024-06-02 ENCOUNTER — Ambulatory Visit: Admitting: Family Medicine

## 2024-06-02 VITALS — BP 124/68 | HR 90 | Temp 98.6°F | Ht 64.0 in | Wt 175.5 lb

## 2024-06-02 DIAGNOSIS — J320 Chronic maxillary sinusitis: Secondary | ICD-10-CM | POA: Diagnosis not present

## 2024-06-02 MED ORDER — AMOXICILLIN-POT CLAVULANATE 875-125 MG PO TABS: 1.0000 | ORAL_TABLET | Freq: Two times a day (BID) | ORAL | 0 refills | Status: DC

## 2024-06-02 MED ORDER — PREDNISONE 10 MG PO TABS
ORAL_TABLET | ORAL | 0 refills | Status: DC
Start: 2024-06-02 — End: 2024-07-14

## 2024-06-02 NOTE — Progress Notes (Signed)
 Subjective:    Patient ID: Shannon [name], female    DOB: October 07, 1956, 68 y.o.   MRN: [id number]  HPI  Wt Readings from Last 3 Encounters:  06/02/24 175 lb 8 oz (79.6 kg)  05/21/24 181 lb (82.1 kg)  05/20/24 182 lb 6.4 oz (82.7 kg)   30.12 kg/m  Vitals:   06/02/24 1109  BP: 124/68  Pulse: 90  Temp: 98.6 F (37 C)  SpO2: 95%   68 yo pt of Dr Jimmy presents with bloody drainage from  nose and headache  Some drainage on pillow from right ear   She has a history of chronic sinusitis and OSA and DM  Bad headache started on Friday Then congested - right side more than left  Clear mucous -then started to turn bloody , and then thick and yellow  Right ear hurts a bit -had some drainage / sore spot in it (? Bump)  No ST   Drinking lots of fluids   No fever  Little cough-not bad/ just dry No wheeze or asthma symptoms   Has narrowing on right side in sinuses -has seen ENT in past  Dr Herminio has seen her   Uses some sinus rinses Also flonase  daily    CT dec  Narrative & Impression  CLINICAL DATA:  Sudden severe headache. Unwell feeling for the last few days. Lightheadedness.   EXAM: CT HEAD WITHOUT CONTRAST   TECHNIQUE: Contiguous axial images were obtained from the base of the skull through the vertex without intravenous contrast.   RADIATION DOSE REDUCTION: This exam was performed according to the departmental dose-optimization program which includes automated exposure control, adjustment of the mA and/or kV according to patient size and/or use of iterative reconstruction technique.   COMPARISON:  MRI brain 04/27/2006   FINDINGS: Brain: No evidence of acute infarction, hemorrhage, hydrocephalus, extra-axial collection or mass lesion/mass effect.   Vascular: No hyperdense vessel or unexpected calcification.   Skull: Normal. Negative for fracture or focal lesion.   Sinuses/Orbits: Dense mucosal thickening in the right maxillary antrum suggesting  chronic sinusitis. Mastoid air cells are clear.   Other: None.   IMPRESSION: 1. No acute intracranial abnormalities. 2. Dense mucosal thickening in the right maxillary antrum, likely chronic sinusitis.     Patient Active Problem List   Diagnosis Date Noted   Facial swelling 05/21/2024   Thyromegaly 05/21/2024   Sinusitis, chronic 10/30/2023   OSA (obstructive sleep apnea) 10/30/2023   Brain fog 05/12/2022   Aortic atherosclerosis (HCC) 03/29/2022   Mood disorder (HCC) 03/07/2022   Psoriatic arthritis (HCC) 09/16/2021   Gouty arthritis of right great toe 07/04/2019   Status post total hip replacement, right 05/29/2018   Fatigue 04/20/2017   Chest pain 11/16/2016   Recurrent cold sores 03/07/2016   Type 2 diabetes mellitus with hyperglycemia (HCC) 11/04/2014   Colitis 03/17/2014   Noninfective gastroenteritis and colitis 03/17/2014   Routine general medical examination at a health care facility 09/16/2012   Hyperlipidemia associated with type 2 diabetes mellitus (HCC)    Type 2 diabetes mellitus with neurological manifestations, controlled (HCC)    FIBROCYSTIC BREAST DISEASE 06/12/2007   Essential hypertension 05/30/2007   Mitral valve disease 05/30/2007   Asthma 05/30/2007   Disorder of bone and cartilage 05/30/2007   Glaucoma 05/30/2007   Mitral valve disorder 05/30/2007   Allergic rhinitis 03/20/2007   GERD 03/20/2007   Past Medical History:  Diagnosis Date   Actinic keratosis    Allergy  Arthritis    hip, back, shoulders, joints   Asthma    Complication of anesthesia    problems with block during breast augmentation and epidural during childbirth   Depression    Diabetes mellitus 09/2011   type 2   Family history of adverse reaction to anesthesia    PROBLEMS WITH BLOCKS   Fatty liver    Fibromyalgia    GERD (gastroesophageal reflux disease)    Glaucoma suspect    Headache    migraines-4x a week headache-1x week migraine   History of hiatal hernia     Hyperlipidemia    Hypertension    Controlled on meds   Mitral valve prolapse    Nose colonized with MRSA 05/15/2018   Osteopenia    Pneumonia    PONV (postoperative nausea and vomiting)    Psoriasis    Shingles 11/2006   Sleep disturbance    Tachycardia    Vertigo    Hx of   Past Surgical History:  Procedure Laterality Date   ABDOMINAL HYSTERECTOMY  2000   /BSO - abn. [facility]   APPENDECTOMY  1978   AUGMENTATION MAMMAPLASTY Bilateral 1981   BLADDER REPAIR     x2   BREAST ENHANCEMENT SURGERY  1987   CARDIAC CATHETERIZATION     [facility]   CARDIAC CATHETERIZATION Left 12/18/2016   Procedure: Left Heart Cath and Coronary Angiography;  Surgeon: Deatrice DELENA Cage, MD;  Location: [facility];  Service: Cardiovascular;  Laterality: Left;   CARPAL TUNNEL RELEASE Bilateral    CERVICAL DISCECTOMY  11/2009   with synthetic discs inserted--  Dr Mavis   COLONOSCOPY     JOINT REPLACEMENT     Left Hip Replacement Left 02/01/2023   NM MYOVIEW  LTD  03/2005   EF 56%, no ischemia   RECTAL PROLAPSE REPAIR     x1   SHOULDER ARTHROSCOPY WITH OPEN ROTATOR CUFF REPAIR Left 12/29/2019   Procedure: LEFT SHOULDER ARTHROSCOPIC DISTAL CLAVICLE EXCISISON, SUBACROMIAL DECOMPRESSION,BICEPS TENOTOMY,ROTATOR CUFF REPAIR ;  Surgeon: Leora Lynwood SAUNDERS, MD;  Location: [facility];  Service: Orthopedics;  Laterality: Left;   SHOULDER CLOSED REDUCTION Left 05/03/2020   Procedure: Left shoulder arthroscopic lysis of adhesions and manipulation under anesthesia;  Surgeon: Leora Lynwood SAUNDERS, MD;  Location: [facility];  Service: Orthopedics;  Laterality: Left;   SHOULDER SURGERY  09/2008, 01/2009   Right shoulder surgery, then redone with lysis of adhesions   TOTAL HIP ARTHROPLASTY Right 05/29/2018   Procedure: TOTAL HIP ARTHROPLASTY ANTERIOR APPROACH;  Surgeon: Leora Lynwood SAUNDERS, MD;  Location: [facility];  Service: Orthopedics;  Laterality: Right;   TOTAL HIP ARTHROPLASTY Left 01/2023   UPPER GI ENDOSCOPY     Social  History   Tobacco Use   Smoking status: Never    Passive exposure: Past   Smokeless tobacco: Never  Vaping Use   Vaping status: Never Used  Substance Use Topics   Alcohol use: No   Drug use: No   Family History  Problem Relation Age of Onset   Diabetes Mother    Heart disease Mother    Hypertension Mother    Ovarian cancer Mother    Melanoma Mother    Cancer Father    Coronary artery disease Father    Heart disease Father    Diabetes Sister    Depression Sister    Coronary artery disease Sister    Heart disease Sister    Pulmonary Hypertension Sister    Rheum arthritis Maternal Aunt  Heart attack Paternal Uncle    Heart attack Paternal Uncle    Breast cancer Maternal Grandmother    Diabetes Maternal Grandmother    Diabetes Maternal Grandfather    Heart attack Cousin    Colon cancer Neg Hx    Allergies  Allergen Reactions   Humalog [Insulin  Lispro] Swelling   Lisinopril Cough   Metformin  Other (See Comments)   Nitrofurantoin Other (See Comments)    Pt unsure   Oxycodone  Itching   Oxycodone -Acetaminophen  Itching   Oxycodone -Acetaminophen  Itching and Nausea Only   Atorvastatin  Diarrhea    May have caused colitis--but not clear cut   Clarithromycin Nausea Only   Glipizide  Other (See Comments)    Sugars too variable   Invokana  [Canagliflozin ]     Nausea, constipation   Current Outpatient Medications on File Prior to Visit  Medication Sig Dispense Refill   ALPRAZolam  (XANAX ) 0.25 MG tablet Take 1 tablet (0.25 mg total) by mouth 2 (two) times daily as needed for anxiety. 20 tablet 0   aspirin -acetaminophen -caffeine  (EXCEDRIN  MIGRAINE) 250-250-65 MG tablet Take 1 tablet by mouth every 6 (six) hours as needed for headache.     Blood Glucose Monitoring Suppl (FIFTY50 GLUCOSE METER 2.0) w/Device KIT      Blood Glucose Monitoring Suppl (ONETOUCH VERIO) w/Device KIT Use as advised 1 kit 1   cetirizine (ZYRTEC) 10 MG tablet Take 10 mg by mouth daily.      clobetasol   (TEMOVATE ) 0.05 % external solution MIX IN JAR OF CERAVE CREAM, THEN APPLY TO BACK TWICE DAILY UNTIL ITCH IMPROVED - AVOID FACE, GROIN, AND AXILLA 50 mL 1   Continuous Glucose Sensor (DEXCOM G6 SENSOR) MISC CHANGE 1 SENSOR EVERY 10 DAYS AS DIRECTED 9 each 3   Continuous Glucose Transmitter (DEXCOM G6 TRANSMITTER) MISC USE 1 TRANSMITTER EVERY 3 MONTHS 1 each 3   docusate sodium  (COLACE) 100 MG capsule Take 300 mg by mouth at bedtime.      DULoxetine  (CYMBALTA ) 60 MG capsule TAKE 1 CAPSULE BY MOUTH DAILY 90 capsule 3   Fluocinolone  Acetonide 0.01 % OIL Apply 1-2 gtts to ears once to twice daily as needed until improved. 20 mL 2   fluticasone  (FLONASE ) 50 MCG/ACT nasal spray Place 1 spray into both nostrils daily.     folic acid (FOLVITE) 1 MG tablet Take 1 mg by mouth daily.     furosemide  (LASIX ) 20 MG tablet TAKE 1 TABLET BY MOUTH DAILY 90 tablet 3   gabapentin  (NEURONTIN ) 100 MG capsule Take 100mg  twice a day and 2 at bedtime 360 capsule 3   Glucagon  3 MG/DOSE POWD Place 3 mg into the nose once as needed for up to 1 dose. 1 each 11   glucose blood (ONETOUCH ULTRA) test strip Use as instructed 2x a day 100 strip 11   HUMULIN  R 500 UNIT/ML injection INJECT SUBCUTANEOUSLY UP TO 200  UNITS IN THE INSULIN  PUMP DAILY 60 mL 3   HYDROcodone -acetaminophen  (NORCO/VICODIN) 5-325 MG tablet Take 1 tablet by mouth every 4 (four) hours as needed for moderate pain (pain score 4-6). 60 tablet 0   Insulin  Disposable Pump (OMNIPOD 5 DEXG7G6 INTRO GEN 5) KIT USE AS DIRECTED 1 kit 0   Insulin  Disposable Pump (OMNIPOD 5 DEXG7G6 PODS GEN 5) MISC USE 1 POD EVERY OTHER DAY 45 each 3   JARDIANCE  10 MG TABS tablet TAKE 1 TABLET BY MOUTH DAILY  BEFORE BREAKFAST 90 tablet 3   LUMIGAN 0.01 % SOLN Place 1 drop into both eyes at  bedtime.      methocarbamol (ROBAXIN) 500 MG tablet Take 1 tablet every 8 hours by oral route as needed.     methotrexate (RHEUMATREX) 2.5 MG tablet Take by mouth.     metoprolol  succinate (TOPROL -XL)  25 MG 24 hr tablet TAKE 1 AND 1/2 TABLETS BY MOUTH  DAILY 135 tablet 3   mometasone  (ELOCON ) 0.1 % cream Apply 1 Application topically daily.     ondansetron  (ZOFRAN -ODT) 4 MG disintegrating tablet DISSOLVE 1 TABLET ON THE TONGUE  EVERY 6 HOURS AS NEEDED FOR  NAUSEA AND VOMITING 180 tablet 0   OneTouch Delica Lancets 33G MISC Use 2x a day 100 each 11   pantoprazole  (PROTONIX ) 40 MG tablet Take 1 tablet (40 mg total) by mouth 2 (two) times daily. 180 tablet 3   potassium chloride  SA (KLOR-CON  M) 20 MEQ tablet Take 1 tablet (20 mEq total) by mouth daily. 90 tablet 3   prochlorperazine  (COMPAZINE ) 10 MG tablet Take 1 tablet (10 mg total) by mouth every 6 (six) hours as needed. 12 tablet 0   rosuvastatin  (CRESTOR ) 10 MG tablet Take 1 tablet (10 mg total) by mouth daily. (Patient taking differently: Take 20 mg by mouth daily.) 90 tablet 3   SKYRIZI PEN 150 MG/ML pen Inject into the skin.     traZODone  (DESYREL ) 100 MG tablet TAKE 2 TABLETS BY MOUTH EVERY  NIGHT AT BEDTIME 180 tablet 3   triamterene -hydrochlorothiazide  (MAXZIDE -25) 37.5-25 MG tablet TAKE 1 TABLET BY MOUTH ONCE  DAILY 90 tablet 3   valACYclovir  (VALTREX ) 1000 MG tablet TAKE 2 TABLETS BY MOUTH AT ONSET OF SYMPTOMS AS NEEDED FOR FEVER  BLISTERS AND TAKE 2 TABLETS BY  MOUTH 12 HOURS LATER 90 tablet 3   No current facility-administered medications on file prior to visit.    Review of Systems  Constitutional:  Positive for fatigue. Negative for fever.  HENT:  Positive for congestion, ear pain, postnasal drip, rhinorrhea, sinus pressure and sore throat. Negative for nosebleeds.   Eyes:  Negative for pain, redness and itching.  Respiratory:  Positive for cough. Negative for shortness of breath and wheezing.   Cardiovascular:  Negative for chest pain.  Gastrointestinal:  Negative for abdominal pain, diarrhea, nausea and vomiting.  Endocrine: Negative for polyuria.  Genitourinary:  Negative for dysuria, frequency and urgency.   Musculoskeletal:  Negative for arthralgias and myalgias.  Allergic/Immunologic: Negative for immunocompromised state.  Neurological:  Positive for headaches. Negative for dizziness, tremors, syncope, weakness and numbness.  Hematological:  Negative for adenopathy. Does not bruise/bleed easily.  Psychiatric/Behavioral:  Negative for dysphoric mood. The patient is not nervous/anxious.        Objective:   Physical Exam Constitutional:      General: She is not in acute distress.    Appearance: Normal appearance. She is well-developed. She is obese. She is not ill-appearing.  HENT:     Head: Normocephalic and atraumatic.     Comments: Right maxillary sinus tenderness without obvious swelling    Right Ear: Tympanic membrane, ear canal and external ear normal.     Left Ear: Tympanic membrane, ear canal and external ear normal.     Ears:     Comments: No source of drainage noted from right ear     Nose: Congestion and rhinorrhea present.     Mouth/Throat:     Pharynx: Oropharynx is clear. No oropharyngeal exudate or posterior oropharyngeal erythema.     Comments: Clear pnd Eyes:     General:  Right eye: No discharge.        Left eye: No discharge.     Conjunctiva/sclera: Conjunctivae normal.     Pupils: Pupils are equal, round, and reactive to light.  Cardiovascular:     Rate and Rhythm: Normal rate and regular rhythm.  Pulmonary:     Effort: Pulmonary effort is normal. No respiratory distress.     Breath sounds: Normal breath sounds. No stridor. No wheezing, rhonchi or rales.     Comments: Good air exch No rales or rhonchi Musculoskeletal:     Cervical back: Normal range of motion and neck supple.  Lymphadenopathy:     Cervical: No cervical adenopathy.  Skin:    General: Skin is warm and dry.     Findings: No rash.  Neurological:     Mental Status: She is alert.     Cranial Nerves: No cranial nerve deficit.     Coordination: Coordination normal.  Psychiatric:         Mood and Affect: Mood normal.           Assessment & Plan:   Problem List Items Addressed This Visit       Respiratory   Sinusitis, chronic - Primary   Exacerbation/worsened  Pt presents with 3 day history of right maxillary pain / right nasal congestion and purulent /bloody nasal mucous  Reviewed last pcp note/ pulm note and last CT from December 2024 noting right maxillary chronic sinusitis  Noted she sees Dr Mcqueen for ENT and they have discussed potential surgery in future   Will treat with augmentin  for 10 d  Given prednisone  to fill if congestion worsens (would like to avoid if possible given her DM)  Encouraged sinus rinse  Encouraged flonase  daily  Update if not starting to improve in a week or if worsening  Call back and Er precautions noted in detail today    May need to avoid mowing or other activities that worsen seasonal allergies as well       Relevant Medications   amoxicillin -clavulanate (AUGMENTIN ) 875-125 MG tablet   predniSONE  (DELTASONE ) 10 MG tablet

## 2024-06-02 NOTE — Telephone Encounter (Signed)
 Will see patient then Agree with ER and UC precautions

## 2024-06-02 NOTE — Telephone Encounter (Signed)
 FYI Only or Action Required?: FYI only for provider.  Patient was last seen in primary care on 05/21/2024 by Shannon Charlie FERNS, MD.  Called Nurse Triage reporting Ear Drainage.  Symptoms began 2 days.  Interventions attempted: Nothing.  Symptoms are: unchanged.  Triage Disposition: See Physician Within 24 Hours  Patient/caregiver understands and will follow disposition?: yes     Copied from CRM [id number]. Topic: Clinical - Red Word Triage >> Jun 02, 2024  8:02 AM [place] A wrote: [facility] that prompted transfer to Nurse Triage: Patient has headache, nasal congesting and blood coming out of her ear. Patient said it started Friday Reason for Disposition  Unexplained bleeding from ear  Answer Assessment - Initial Assessment Questions 1. LOCATION: Which ear is involved?      Right  2. COLOR: What is the color of the discharge?      red 3. CONSISTENCY: How runny is the discharge? Could it be water?      Not water 4. ONSET: When did you first notice the discharge?     2 day  5. PAIN: Is there any earache? How bad is it?  (Scale 0-10; none, mild, moderate or severe)     Tender inside  6. OBJECTS: Have you put anything in your ear? (e.g., Q-tip, other object)      Q tip 7. OTHER SYMPTOMS: Do you have any other symptoms? (e.g., headache, fever, dizziness, vomiting, runny nose)     Right side of nose bloody draianage, ear is sore   8. PREGNANCY: Is there any chance you are pregnant? When was your last menstrual period?     N/a  Protocols used: Ear - Discharge-A-AH

## 2024-06-02 NOTE — Patient Instructions (Signed)
 Continue flonase  Take augmentin  for sinus infection   If needed-fill the prescription for prednisone    Update if not starting to improve in a week or if worsening   Any severe symptoms-go to the ER

## 2024-06-02 NOTE — Assessment & Plan Note (Addendum)
 Exacerbation/worsened  Pt presents with 3 day history of right maxillary pain / right nasal congestion and purulent /bloody nasal mucous  Reviewed last pcp note/ pulm note and last CT from December 2024 noting right maxillary chronic sinusitis  Noted she sees Dr Mcqueen for ENT and they have discussed potential surgery in future   Will treat with augmentin  for 10 d  Given prednisone  to fill if congestion worsens (would like to avoid if possible given her DM)  Encouraged sinus rinse  Encouraged flonase  daily  Update if not starting to improve in a week or if worsening  Call back and Er precautions noted in detail today    May need to avoid mowing or other activities that worsen seasonal allergies as well

## 2024-06-02 NOTE — Telephone Encounter (Signed)
 FYI for appt this morning with Dr. Randeen

## 2024-06-03 ENCOUNTER — Ambulatory Visit
Admission: RE | Admit: 2024-06-03 | Discharge: 2024-06-03 | Disposition: A | Discharge status: Home / Self Care | Source: Ambulatory Visit | Attending: Internal Medicine | Admitting: Internal Medicine

## 2024-06-03 ENCOUNTER — Ambulatory Visit: Payer: Self-pay

## 2024-06-03 DIAGNOSIS — E1149 Type 2 diabetes mellitus with other diabetic neurological complication: Secondary | ICD-10-CM | POA: Insufficient documentation

## 2024-06-03 NOTE — Telephone Encounter (Signed)
 Heather RN with [facility] called and pt now has severe rt ear pain, pts ear is clogged and pt cannot hear out of rt ear. No available appt at [facility] and pt was seen on 06/02/24 byDr Tower with instruction if condition becomes severe pt should go to Ed. Pt has not started prednisone . Advised if pain is severe and pt cannot hear out of rt ear per instructions pt should go to ED. Heather RN will mlet pt know. Sending note to Dr Randeen.

## 2024-06-03 NOTE — Telephone Encounter (Signed)
 FYI Only or Action Required?: Action required by provider: update on patient condition.  Patient was last seen in primary care on 06/02/2024 by Randeen Laine LABOR, MD.  Called Nurse Triage reporting Otalgia.  Symptoms began today.  Interventions attempted: Prescription medications: Augmentin .  Symptoms are: gradually worsening.  Triage Disposition: Go to ED Now (or PCP Triage)  Patient/caregiver understands and will follow disposition?: UnsureCopied from CRM [id number]. Topic: Clinical - Red Word Triage >> Jun 03, 2024 11:17 AM Deleta RAMAN wrote: Red Word that prompted transfer to Nurse Triage: patient went to doctor on yesterday Dr. Randeen due to having blood leaking out of her right ear.she having constant pain and swelling. She can barely hear out of her right ear at the moment. Patient does have serve sinus issues Reason for Disposition  Patient sounds very sick or weak to the triager  Answer Assessment - Initial Assessment Questions 1. LOCATION: Which ear is involved?     Right ear 2. ONSET: When did the ear pain start?      today 3. SEVERITY: How bad is the pain?  (Scale 1-10; mild, moderate or severe)     severe 4. URI SYMPTOMS: Do you have a runny nose or cough?     Sinus infection  5. FEVER: Do you have a fever? If Yes, ask: What is your temperature, how was it measured, and when did it start?     Not sure 6. CAUnSE: Have you been swimming recently?, How often do you use Q-TIPS?, Have you had any recent air travel or scuba diving?     no 7. OTHER SYMPTOMS: Do you have any other symptoms? (e.g., decreased hearing, dizziness, headache, stiff neck, vomiting)     Pt can't hear out of ear    Pt was seen yesterday and placed on Augmentin  and Prednisone . Pt has not filled/taken steroid yet due to Dr tower's request of holding of unless ear worsens. Pt stated the pain is severe and pt can't hear of ear. [facility] was notified and unable to create appt today. Rena advised pt  to go to ED. Pt stated she is going to fill Prednisone  and if symptoms don't get better/stay the same she will go to UC.  Protocols used: [facility]

## 2024-06-03 NOTE — Telephone Encounter (Signed)
 If pain is that severe I agree with ER or UC disposition  If more mild then prednisone  may help  Thanks for the update

## 2024-06-04 ENCOUNTER — Institutional Professional Consult (permissible substitution) (INDEPENDENT_AMBULATORY_CARE_PROVIDER_SITE_OTHER): Admitting: Family Medicine

## 2024-06-12 ENCOUNTER — Other Ambulatory Visit: Payer: Self-pay | Admitting: Internal Medicine

## 2024-06-12 ENCOUNTER — Encounter: Payer: Self-pay | Admitting: Internal Medicine

## 2024-06-12 DIAGNOSIS — E1165 Type 2 diabetes mellitus with hyperglycemia: Secondary | ICD-10-CM

## 2024-06-19 ENCOUNTER — Ambulatory Visit: Admitting: Clinical

## 2024-06-19 DIAGNOSIS — F331 Major depressive disorder, recurrent, moderate: Secondary | ICD-10-CM

## 2024-06-19 NOTE — Progress Notes (Signed)
   Shannon Barthel, LCSW

## 2024-06-19 NOTE — Progress Notes (Signed)
 [facility] Counselor/Therapist Progress Note  Patient ID: MYKERIA GARMAN, MRN: [id number],    Date: 06/19/2024  Time Spent: 1:35pm - 2:40pm : 65 minutes   Treatment Type: Individual Therapy  Reported Symptoms: depressed mood, loneliness, decreased concentration, decreased energy  Mental Status Exam: Appearance:  Neat and Well Groomed     Behavior: Appropriate  Motor: Normal  Speech/Language:  Clear and Coherent and Normal Rate  Affect: Tearful  Mood: depressed  Thought process: normal  Thought content:   WNL  Sensory/Perceptual disturbances:   WNL  Orientation: oriented to person, place, time/date, and situation  Attention: Good  Concentration: Good  Memory: WNL  Fund of knowledge:  Good  Insight:   Good  Judgment:  Good  Impulse Control: Good   Risk Assessment: Danger to Self:  No Patient denied current suicidal ideation  Self-injurious Behavior: No Danger to Others: No Patient denied current homicidal ideation Duty to Warn:no Physical Aggression / Violence:No  Access to Firearms a concern: No  Gang Involvement:No   Subjective: Patient stated, kind of the same ol same ol in response to events since last session. Patient stated, the end of last week and this week so far just kind down in response to mood since last session. Patient stated, I've not been wanting to talk to people on the phone very much or making any effort to. Patient stated, I have no energy and I have to force myself to do tings Ive got to do. Patient stated, that would be fine in reference to referral to a psychiatrist. Patient stated, I'm so lonely all the time. Patient reported patient does not hear from or see oldest sons. Patient stated, I feel like they're ok without me in their life in reference to patient's sons.  Patient reported grief, patient's marriage, lack of closeness in marriage, and relationship with children are current stressors. Patient stated, I don't feel  safe and secure like I did before in reference to patient's marriage and emotional security. Patient stated,  I go into it with extreme caution in reference to conversations with husband. Patient reported patient experiences defensiveness in response to communication with husband.   Interventions: Motivational Interviewing. Clinician conducted session in person at clinician's office at [facility]. Reviewed events since last session and assessed for changes. Discussed recommendation for a consultation with a psychiatrist and referral. Clinician utilized motivational interviewing to explore additional goals for therapy. Clinician utilized a task centered approach in collaboration with patient to develop goals for therapy. Patient participated in development of goals and agreed to goals for therapy.    Collaboration of Care: Discussed consent required for referral to psychiatrist   Diagnosis:  Major depressive disorder, recurrent episode, moderate (HCC)     Plan: Patient is to utilize [employer], thought re-framing, mindfulness and coping strategies to decrease symptoms associated with their diagnosis. Frequency: bi-weekly  Modality: individual      Long-term goal:   Reduce overall level, frequency, and intensity of the feelings of depression as evidenced by decrease in grief, loss of interest, depressed mood, isolation, increased sleep, low energy, psychomotor retardation, fatigue, feelings of guilt, lack of concentration, loss of motivation from 7 days/week to 0 to 1 days/week per patient report for at least 3 consecutive months. Target Date: 05/21/25  Progress: established 05/21/24    Short-term goal:  Begin healthy grieving process as it relates to multiple losses patient has experienced (mother, father, sister, mother in law) Target Date: 05/21/25  Progress: established  06/19/24   Develop and implement effective communication strategies for patient to utilize when  expressing her thoughts and feelings to others in a controlled and assertive way  Target Date: 05/21/25  Progress: established 06/19/24   Increase patient's participation in positive activities from 0 days per week to 3 per day week Target Date: 05/21/25  Progress: established 06/19/24   Develop and implement strategies to increase feelings of closeness in patient's relationship with husband Target Date: 05/21/25  Progress: established 06/19/24   Increase communication/contact with patient's children Target Date: 05/21/25  Progress: established 06/19/24                                   [name], LCSW

## 2024-06-30 ENCOUNTER — Ambulatory Visit (INDEPENDENT_AMBULATORY_CARE_PROVIDER_SITE_OTHER): Admitting: Clinical

## 2024-06-30 DIAGNOSIS — F331 Major depressive disorder, recurrent, moderate: Secondary | ICD-10-CM | POA: Diagnosis not present

## 2024-06-30 NOTE — Progress Notes (Signed)
   [name], LCSW

## 2024-06-30 NOTE — Progress Notes (Signed)
 [facility] Counselor/Therapist Progress Note  Patient ID: Shannon [name], MRN: [id number],    Date: 06/30/2024  Time Spent: 1:33pm - 2:35pm : 62 minutes   Treatment Type: Individual Therapy  Reported Symptoms: depressed mood, recent feelings of hopelessness and loneliness, decreased sleep  Mental Status Exam: Appearance:  Neat and Well Groomed     Behavior: Appropriate  Motor: Normal  Speech/Language:  Clear and Coherent and Normal Rate  Affect: Flat  Mood: sad  Thought process: normal  Thought content:   WNL  Sensory/Perceptual disturbances:   WNL  Orientation: oriented to person, place, time/date, and situation  Attention: Good  Concentration: Good  Memory: WNL  Fund of knowledge:  Good  Insight:   Good  Judgment:  Good  Impulse Control: Good   Risk Assessment: Danger to Self:  No Patient denied current suicidal ideation  Self-injurious Behavior: No Danger to Others: No Patient denied current homicidal ideation Duty to Warn:no Physical Aggression / Violence:No  Access to Firearms a concern: No  Gang Involvement:No   Subjective: Patient stated, pretty much the same in response to events since last session. Patient reported patient attended two funerals this weekend and stated, it was a sad weekend.  Patient stated, just so many things going on within the family. Patient stated, I just have a tendency to keep plugging, I don't know what else to do, when I don't know what else to do I just try to get busy. Patient stated, I'm not sleeping as well as I was, I felt so much loneliness this week. Patient reported patient reached out to daughter in law this week and daughter in law did not accept patient's offer to spend time together. Patient reported in the past patient and daughter in law spent a significant amount of time together. Patient reported patient's son moved into patient's mother's home and patient stated, It was hard to see all that  tearing out of my moms home. Patient reported patient made choices based on others' needs in regards to patient's mother's home and patient stated, I don't think I made what was the best choices at that time. Patient stated, Its just like losing another piece of the puzzle. Patient stated, I just wasn't there yet in reference to mother's home. Patient stated, I don't think he (husband) understands Im moving as fast as I can in reference to going through mother's belongings. Patient stated, If its going to require me going down there its not going to happen very often in reference to visiting son's home/mother's home. Patient stated, I felt closer to him (husband) in reference to attending funerals and going to eat with husband this weekend. Patient reported patient tried to initiate intimacy with husband and reported husband did not reciprocate. Patient reported not feeling love, not getting compliments and stated, I don't know where we go from here, I feel it more and more a wall between us  in reference to patient's marriage. Patient reported patient initiates conversations by telling her husband she would like to talk to him and husband responds defensively. Patient stated, Its seems to come back on what I've done wrong in reference to conversations with husband.  Patient reported physical pain, caring for patient's mother for 1.5 years, and limited time due to patient/husband providing care for others have been barriers to spending time together. Patient stated, last week was terrible, very lonely, kind of hopeless feeling. Patient stated, It hurts my feelings, it makes me feel unloved, unwanted in reference to  interactions with husband.   Interventions: Cognitive Behavioral Therapy and Interpersonal. Clinician conducted session via caregility video from clinician's home office. Patient provided verbal consent to proceed with telehealth session and is aware of limitations of telephone or  video visits. Patient participated in session from patient's home. Reviewed events since last session. Discussed current stressors and coping strategies patient utilizes in response. Discussed triggers for loneliness. Provided psycho education related to generating alternatives and assisted patient in practicing generating alternatives. Provided supportive therapy, active listening, and validation as patient discussed son moving into mother's home and patient's thoughts/feelings in response. Discussed patient's concerns related to patient's marriage and thoughts/feelings triggered by husband's response to patient. Explored patient's communication style. Explored and identified barriers to patient/husband participating in activities together. Discussed activities patient and husband enjoy participating in together and discussed scheduling dates. Provided psycho education related to journaling.    Collaboration of Care: not required at this time   Diagnosis:  Major depressive disorder, recurrent episode, moderate (HCC)     Plan: Patient is to utilize [employer] Therapy, thought re-framing, mindfulness and coping strategies to decrease symptoms associated with their diagnosis. Frequency: bi-weekly  Modality: individual      Long-term goal:   Reduce overall level, frequency, and intensity of the feelings of depression as evidenced by decrease in grief, loss of interest, depressed mood, isolation, increased sleep, low energy, psychomotor retardation, fatigue, feelings of guilt, lack of concentration, loss of motivation from 7 days/week to 0 to 1 days/week per patient report for at least 3 consecutive months. Target Date: 05/21/25  Progress: progressing    Short-term goal:  Begin healthy grieving process as it relates to multiple losses patient has experienced (mother, father, sister, mother in law) Target Date: 05/21/25  Progress: progressing    Develop and implement effective communication  strategies for patient to utilize when expressing her thoughts and feelings to others in a controlled and assertive way  Target Date: 05/21/25  Progress: progressing    Increase patient's participation in positive activities from 0 days per week to 3 per day week Target Date: 05/21/25  Progress: progressing    Develop and implement strategies to increase feelings of closeness in patient's relationship with husband Target Date: 05/21/25  Progress: progressing    Increase communication/contact with patient's children Target Date: 05/21/25  Progress: progressing                              [name], LCSW

## 2024-07-02 ENCOUNTER — Encounter: Payer: Self-pay | Admitting: Internal Medicine

## 2024-07-03 MED ORDER — HYDROCODONE-ACETAMINOPHEN 5-325 MG PO TABS: 1.0000 | ORAL_TABLET | ORAL | 0 refills | Status: DC | PRN

## 2024-07-03 NOTE — Telephone Encounter (Signed)
 Name of Medication: Norco 5-325  Name of Pharmacy: [facility] [address]  Last Fill or Written Date and Quantity: 05/12/24 #[id number] [phone] [id number]  Last Office Visit and Type: 7/14 [place] [facility] 05/21/24 PCP for f/u  Next Office Visit and Type: TOC [place]al 09/10/24 Last Controlled Substance Agreement Date: N/A  Last UDS: N/A

## 2024-07-08 ENCOUNTER — Telehealth: Payer: Self-pay | Admitting: Internal Medicine

## 2024-07-08 NOTE — Telephone Encounter (Signed)
 Copied from CRM #[id number]. Topic: General - Call Back - No Documentation >> Jul 07, 2024  4:42 PM Wess RAMAN wrote: Reason for CRM: Patient would like a call back from [place], LCSW.  Callback #: [phone]

## 2024-07-10 ENCOUNTER — Ambulatory Visit (INDEPENDENT_AMBULATORY_CARE_PROVIDER_SITE_OTHER): Admitting: Clinical

## 2024-07-10 DIAGNOSIS — F331 Major depressive disorder, recurrent, moderate: Secondary | ICD-10-CM

## 2024-07-10 NOTE — Progress Notes (Unsigned)
 [facility] Counselor/Therapist Progress Note  Patient ID: Shannon [name], MRN: [id number],    Date: 07/10/2024  Time Spent: 10:37am - 11:32am : 55 minutes   Treatment Type: Individual Therapy  Reported Symptoms: depressed mood  Mental Status Exam: Appearance:  Neat and Well Groomed     Behavior: Appropriate  Motor: Normal  Speech/Language:  Clear and Coherent and Normal Rate  Affect: Tearful  Mood: depressed  Thought process: normal  Thought content:   WNL  Sensory/Perceptual disturbances:   WNL  Orientation: oriented to person, place, time/date, and situation  Attention: Good  Concentration: Good  Memory: WNL  Fund of knowledge:  Good  Insight:   Good  Judgment:  Good  Impulse Control: Good   Risk Assessment: Danger to Self:  No Patient denied current suicidal ideation  Self-injurious Behavior: No Danger to Others: No Patient denied current homicidal ideation Duty to Warn:no Physical Aggression / Violence:No  Access to Firearms a concern: No  Gang Involvement:No   Subjective: Patient stated, I think as far as [name] and I, its been a little nicer in reference to patient/husband's relationship. Patient reported patient/husband watched a movie together Saturday night. Patient stated, not a lot of change but I noticed a little bit in reference to relationship with husband. Patient stated, just the slightest thing gives me some kind of encouragement in reference to changes in patient's relationship. Patient reported patient recently initiated a conversation with husband and inquired about lack of affection and husband became defensive. Patient stated, a little bit in response to change in patient's mood since last session. Patient stated, It still kind of feels like this is never going to change in reference to patient's relationship with husband. Patient stated, I keep coming back to the same thing, what am I doing wrong, why has everyone  walked away from me. Patient stated, I keep searching and trying to figure out what I'm doing wrong, I don't feel like I'm lovable, its made me question how I've handled myself my whole life. Patient stated, as long as I can remember I've always wanted someone to love me and stand by me. Patient stated, [name] tells me quite often that I am negative. Patient stated, my first marriage caused severe trust issues, I had trust issues as a child, I have trouble believing that people see the good in me. Patient stated, I feel like I have to walk on egg shells, I feel like I have to maintain his (husband) happiness and things will go smoother. Patient stated, at great cost, its accumulated to this in reference to the cost of maintaining others' happiness. Patient stated, my family is my everything,I don't have a clue what could make me happy, there's nothing that sounds appealing. Patient stated, I don't know how to be kind to myself, I have always put myself down and not had a lot of confidence, I've never felt like I was good enough. Patient stated, I have not in response to journaling. Patient stated, I am so depressed, I've got to do something different.   Interventions: Cognitive Behavioral Therapy. Clinician conducted session via caregility video from clinician's office at [facility]. Patient provided verbal consent to proceed with telehealth session and is aware of limitations of telephone or video visits. Patient participated in session from patient's home. Reviewed events since last session and assessed for changes. Discussed recent changes in interactions between patient and husband. Explored contributing factors to recent change in interactions. Explored patient's  perception of patient's self and core beliefs. Provided psycho education related to depressive symptoms, cognitive distortions, such as, personalization, over generalization. Provided psycho  education related to connection between thoughts/feelings. Explored the cost of maintaining other's happiness Discussed sense of responsibility for other's happiness. Provided psycho education related to burnout and core beliefs. Challenged statements/thoughts to assist patient in reframing thought. Reviewed journaling.   Collaboration of Care: not required at this time   Diagnosis:  Major depressive disorder, recurrent episode, moderate (HCC)     Plan: Patient is to utilize [employer] Therapy, thought re-framing, mindfulness and coping strategies to decrease symptoms associated with their diagnosis. Frequency: bi-weekly  Modality: individual      Long-term goal:   Reduce overall level, frequency, and intensity of the feelings of depression as evidenced by decrease in grief, loss of interest, depressed mood, isolation, increased sleep, low energy, psychomotor retardation, fatigue, feelings of guilt, lack of concentration, loss of motivation from 7 days/week to 0 to 1 days/week per patient report for at least 3 consecutive months. Target Date: 05/21/25  Progress: progressing    Short-term goal:  Begin healthy grieving process as it relates to multiple losses patient has experienced (mother, father, sister, mother in law) Target Date: 05/21/25  Progress: progressing    Develop and implement effective communication strategies for patient to utilize when expressing her thoughts and feelings to others in a controlled and assertive way  Target Date: 05/21/25  Progress: progressing    Increase patient's participation in positive activities from 0 days per week to 3 per day week Target Date: 05/21/25  Progress: progressing    Develop and implement strategies to increase feelings of closeness in patient's relationship with husband Target Date: 05/21/25  Progress: progressing    Increase communication/contact with patient's children Target Date: 05/21/25  Progress: progressing                           Shannon Seats, LCSW

## 2024-07-10 NOTE — Progress Notes (Unsigned)
   [name], LCSW

## 2024-07-14 ENCOUNTER — Ambulatory Visit: Admitting: Internal Medicine

## 2024-07-14 ENCOUNTER — Encounter: Payer: Self-pay | Admitting: Internal Medicine

## 2024-07-14 VITALS — BP 120/64 | HR 98 | Ht 64.0 in | Wt 178.2 lb

## 2024-07-14 DIAGNOSIS — Z794 Long term (current) use of insulin: Secondary | ICD-10-CM

## 2024-07-14 DIAGNOSIS — E782 Mixed hyperlipidemia: Secondary | ICD-10-CM | POA: Diagnosis not present

## 2024-07-14 DIAGNOSIS — E1165 Type 2 diabetes mellitus with hyperglycemia: Secondary | ICD-10-CM

## 2024-07-14 DIAGNOSIS — E66811 Obesity, class 1: Secondary | ICD-10-CM

## 2024-07-14 DIAGNOSIS — E1142 Type 2 diabetes mellitus with diabetic polyneuropathy: Secondary | ICD-10-CM | POA: Diagnosis not present

## 2024-07-14 DIAGNOSIS — E041 Nontoxic single thyroid nodule: Secondary | ICD-10-CM

## 2024-07-14 DIAGNOSIS — Z7984 Long term (current) use of oral hypoglycemic drugs: Secondary | ICD-10-CM

## 2024-07-14 LAB — POCT GLYCOSYLATED HEMOGLOBIN (HGB A1C): Hemoglobin A1C: 7.2 % — AB (ref 4.0–5.6)

## 2024-07-14 LAB — GLUCOSE, POCT (MANUAL RESULT ENTRY): POC Glucose: 285 mg/dL — AB (ref 70–99)

## 2024-07-14 NOTE — Progress Notes (Addendum)
 Patient ID: Shannon [name], female   DOB: 06-09-1956, 68 y.o.   MRN: [id number]   HPI: Shannon [name] is a 68 y.o.-year-old female, initially referred by her PCP, Dr. Jimmy, returning for follow-up for DM2, dx in 2012, insulin -dependent since ~2019, uncontrolled, with complications (peripheral neuropathy). Last visit 4 months ago.  Her daughter, Shannon [name], is also my patient.  Interim history: She has no nausea, increased urination, nausea.  She does have blurry vision - glaucoma. She usually stays late at night and eats snacks, including sweets. She gets steroid inj in back and hips but did not have these recently. She was dx'ed with fibromyalgia. She also complains about feeling hot all the time.  Reviewed history: Patient described that her sugars worsened after she had to stop Metformin  due to GI symptoms and was changed to sulfonylurea. They increased even further after steroid injection for Achilles tendinitis.  Her sugars were initially in the 300-400 range, but then increased to 500-HI (>600) >> she presented to the emergency room (11/12/2020).  Afterwards, she started to work on her diet.  Insulin  pump: -OmniPod Dash >> Omnipod 5  Insulin : -She has allergy to Humalog/Lyumjev  >> significant irritation and inflammation at the infusion site -Switched to NovoLog  samples since NovoLog  was not covered by her insurance despite 2 preauthorization requests - U500 now  CGM: -Dexcom G6  Supplies: -Mail-order pharmacy for Dexcom  Reviewed HbA1c levels: Lab Results  Component Value Date   HGBA1C 8.1 (H) 03/11/2024   HGBA1C 7.4 (A) 11/06/2023   HGBA1C 6.9 (A) 03/06/2023   HGBA1C 7.3 (A) 11/23/2022   HGBA1C 8.5 (A) 07/20/2022   HGBA1C 8.5 (A) 03/16/2022   HGBA1C 8.2 (A) 11/15/2021   HGBA1C 8.0 (A) 07/22/2021   HGBA1C 10.4 (A) 02/15/2021   HGBA1C 9.0 (A) 11/16/2020   HGBA1C 7.8 (A) 07/13/2020   HGBA1C 8.1 (H) 12/22/2019   HGBA1C 7.3 (A) 07/04/2019   HGBA1C 8.9 (H)  01/22/2019   HGBA1C 8.8 (H) 10/15/2018   HGBA1C 7.9 (H) 02/13/2018   HGBA1C 7.0 (H) 09/19/2017   HGBA1C 8.6 (H) 04/13/2017   HGBA1C 8.2 (H) 11/16/2016   HGBA1C 7.4 (H) 05/17/2016   HGBA1C 7.8 (H) 11/19/2015   HGBA1C 7.2 (H) 04/23/2015   HGBA1C 8.4 (H) 01/14/2015   HGBA1C 8.6 (H) 09/22/2014   HGBA1C 8.0 (H) 03/31/2014   HGBA1C 6.9 (H) 09/23/2013   HGBA1C 7.0 (H) 03/17/2013   HGBA1C 6.5 09/16/2012   HGBA1C 6.6 (H) 02/20/2012   HGBA1C 9.0 (H) 10/19/2011  07/06/2023: HbA1c 6.8%  Previously on: - Glimepiride  2 >> 4 mg before breakfast   - Onglyza 5 mg before breakfast - Lantus  30-35 units at bedtime She is intolerant to Metformin  >> N/V/D >> had to stop and changed to Glimepiride .  Previously on: - Ozempic  0.5 mg weekly >> stopped 2/2 nausea - Lantus  26 >> 34 >> Toujeo  46 units at bedtime - Lyumjev  6-10 >> 12-18 >> 15 >> 20 units before each meal  Now on: - Pump settings with U500 - changes suggested at last OV are shown in bold: - basal rates: 12 am: 1.0  3 am: 0.9  6 am: 0.7  9 am: 1 9 PM: 1.1  - ICR:  (start the bolus 45-60 min before the meal)  12 AM: 1:11 >> 8  12 pm: 1:10  4 PM: 1:10 8 PM: 1:8 - target: 120-120  - ISF: 1:60 - Active insulin  time: 6 hours  Also, continue: - Jardiance  10 mg before b'fast -  restarted 11/2022 -  -started 11/2022 >> stopped 2/2 severe constipation  TDD from basal insulin :  62% >> 69% >> 73% (27 units) >> 72% TDD from bolus insulin : 20% >> 32% >> 27% (10 units) >> 28% Total daily dose: 32-40 >> 40-50 U500 units/day >> up to 250 units of insulin  a day - extended bolusing: not using - changes infusion site: q1-2 days  She checks her sugars more than 4 times a day with her  Dexcom CGM:  Previously:  Prev.:   Lowest sugar was LO >> .SABRA. 40 >> 39 (sensor) >> 50s; she has hypoglycemia awareness at 110.  Highest sugar was HI ... >> 300s >> 300s >> 300s  Glucometer: One Touch Verio  Pt's meals are: - Breakfast: usually skips,  when eats b'fast: sausage bisquit - Lunch: salad or sandwich - Dinner: meat and 2 veggies - Snacks: 1-2: apples, oranges, occasionally chips  -+ CKD, last BUN/creatinine:  Lab Results  Component Value Date   BUN 15 05/21/2024   BUN 18 03/17/2024   CREATININE 0.81 05/21/2024   CREATININE 0.71 03/17/2024   Lab Results  Component Value Date   MICRALBCREAT 20.6 03/11/2024  She is not on ACE inhibitor/ARB. She had cough with lisinopril.  -+ HL; last set of lipids: Lab Results  Component Value Date   CHOL 157 03/11/2024   HDL 61.30 03/11/2024   LDLCALC 67 03/11/2024   LDLDIRECT 116.0 03/12/2023   TRIG 142.0 03/11/2024   CHOLHDL 3 03/11/2024  On Crestor  5 mg  daily.  - last eye exam was in 09/2023: No DR reportedly. She has glaucoma - exams q6 mo. Her blurry vision improved (had laser surgery for glaucoma 05/2021).   - She has numbness and tingling in her toes. She also has muscle cramps in her feet.  Last foot exam 03/19/2024.  Pt has FH of DM in mother, son, daughter, sister, MGM, MGF, M aunt and uncles.  She also has a history of HTN, fatty liver, GERD. She has psoriatic arthritis and ankylosing spondylitis. She has significant joint pain - hands, back, hips.  She is on a biologic infusion (Cosentyx). She presented to the ED for nausea on 12/24/2019.  Per review of the labs, she appeared dehydrated, but no distinct pathology was found.  Of note, lipase was normal. She started Lopressor  for HTN - but had dizziness >> stopped. She has a family history of Parkinson's.  She had a thyroid  ultrasound (06/03/2024): Small thyroid  gland, with 1 dominant nodule of 1 cm, spongiform, isoechoic  TSH was normal: Lab Results  Component Value Date   TSH 1.03 05/21/2024   ROS: + see HPI  I reviewed pt's medications, allergies, PMH, social hx, family hx, and changes were documented in the history of present illness. Otherwise, unchanged from my initial visit note.  Past Medical History:   Diagnosis Date   Actinic keratosis    Allergy    Arthritis    hip, back, shoulders, joints   Asthma    Complication of anesthesia    problems with block during breast augmentation and epidural during childbirth   Depression    Diabetes mellitus 09/2011   type 2   Family history of adverse reaction to anesthesia    PROBLEMS WITH BLOCKS   Fatty liver    Fibromyalgia    GERD (gastroesophageal reflux disease)    Glaucoma suspect    Headache    migraines-4x a week headache-1x week migraine   History of hiatal hernia  Hyperlipidemia    Hypertension    Controlled on meds   Mitral valve prolapse    Nose colonized with MRSA 05/15/2018   Osteopenia    Pneumonia    PONV (postoperative nausea and vomiting)    Psoriasis    Shingles 11/2006   Sleep disturbance    Tachycardia    Vertigo    Hx of   Past Surgical History:  Procedure Laterality Date   ABDOMINAL HYSTERECTOMY  2000   /BSO - abn. [facility]   APPENDECTOMY  1978   AUGMENTATION MAMMAPLASTY Bilateral 1981   BLADDER REPAIR     x2   BREAST ENHANCEMENT SURGERY  1987   CARDIAC CATHETERIZATION     [facility]   CARDIAC CATHETERIZATION Left 12/18/2016   Procedure: Left Heart Cath and Coronary Angiography;  Surgeon: Deatrice DELENA Cage, MD;  Location: [facility];  Service: Cardiovascular;  Laterality: Left;   CARPAL TUNNEL RELEASE Bilateral    CERVICAL DISCECTOMY  11/2009   with synthetic discs inserted--  Dr Mavis   COLONOSCOPY     JOINT REPLACEMENT     Left Hip Replacement Left 02/01/2023   NM MYOVIEW  LTD  03/2005   EF 56%, no ischemia   RECTAL PROLAPSE REPAIR     x1   SHOULDER ARTHROSCOPY WITH OPEN ROTATOR CUFF REPAIR Left 12/29/2019   Procedure: LEFT SHOULDER ARTHROSCOPIC DISTAL CLAVICLE EXCISISON, SUBACROMIAL DECOMPRESSION,BICEPS TENOTOMY,ROTATOR CUFF REPAIR ;  Surgeon: Leora Lynwood SAUNDERS, MD;  Location: [facility] ORS;  Service: Orthopedics;  Laterality: Left;   SHOULDER CLOSED REDUCTION Left 05/03/2020   Procedure: Left  shoulder arthroscopic lysis of adhesions and manipulation under anesthesia;  Surgeon: Leora Lynwood SAUNDERS, MD;  Location: [facility] OR[facility];  Service: Orthopedics;  Laterality: Left;   SHOULDER SURGERY  09/2008, 01/2009   Right shoulder surgery, then redone with lysis of adhesions   TOTAL HIP ARTHROPLASTY Right 05/29/2018   Procedure: TOTAL HIP ARTHROPLASTY ANTERIOR APPROACH;  Surgeon: Leora Lynwood SAUNDERS, MD;  Location: [facility] OR[facility];  Service: Orthopedics;  Laterality: Right;   TOTAL HIP ARTHROPLASTY Left 01/2023   UPPER GI ENDOSCOPY     Social History   Socioeconomic History   Marital status: Married    Spouse name: Not on file   Number of children: 5   Years of education: Not on file   Highest education level: Not on file  Occupational History   Occupation: homemaker  Tobacco Use   Smoking status: Never    Passive exposure: Past   Smokeless tobacco: Never  Vaping Use   Vaping status: Never Used  Substance and Sexual Activity   Alcohol use: No   Drug use: No   Sexual activity: Not on file  Other Topics Concern   Not on file  Social History Narrative   Home maker   Social Drivers of Health   Financial Resource Strain: Not on file  Food Insecurity: Not on file  Transportation Needs: Not on file  Physical Activity: Not on file  Stress: Not on file  Social Connections: Not on file  Intimate Partner Violence: Not on file   Current Outpatient Medications on File Prior to Visit  Medication Sig Dispense Refill   ALPRAZolam  (XANAX ) 0.25 MG tablet Take 1 tablet (0.25 mg total) by mouth 2 (two) times daily as needed for anxiety. 20 tablet 0   amoxicillin -clavulanate (AUGMENTIN ) 875-125 MG tablet Take 1 tablet by mouth 2 (two) times daily. 20 tablet 0   aspirin -acetaminophen -caffeine  (EXCEDRIN  MIGRAINE) 250-250-65 MG tablet Take 1 tablet by mouth  every 6 (six) hours as needed for headache.     Blood Glucose Monitoring Suppl (FIFTY50 GLUCOSE METER 2.0) w/Device KIT      Blood Glucose Monitoring  Suppl (ONETOUCH VERIO) w/Device KIT Use as advised 1 kit 1   cetirizine (ZYRTEC) 10 MG tablet Take 10 mg by mouth daily.      clobetasol  (TEMOVATE ) 0.05 % external solution MIX IN JAR OF CERAVE CREAM, THEN APPLY TO BACK TWICE DAILY UNTIL ITCH IMPROVED - AVOID FACE, GROIN, AND AXILLA 50 mL 1   Continuous Glucose Sensor (DEXCOM G6 SENSOR) MISC CHANGE 1 SENSOR EVERY 10 DAYS AS DIRECTED 9 each 3   Continuous Glucose Transmitter (DEXCOM G6 TRANSMITTER) MISC USE 1 TRANSMITTER EVERY 3 MONTHS 1 each 3   docusate sodium  (COLACE) 100 MG capsule Take 300 mg by mouth at bedtime.      DULoxetine  (CYMBALTA ) 60 MG capsule TAKE 1 CAPSULE BY MOUTH DAILY 90 capsule 3   Fluocinolone  Acetonide 0.01 % OIL Apply 1-2 gtts to ears once to twice daily as needed until improved. 20 mL 2   fluticasone  (FLONASE ) 50 MCG/ACT nasal spray Place 1 spray into both nostrils daily.     folic acid (FOLVITE) 1 MG tablet Take 1 mg by mouth daily.     furosemide  (LASIX ) 20 MG tablet TAKE 1 TABLET BY MOUTH DAILY 90 tablet 0   gabapentin  (NEURONTIN ) 100 MG capsule Take 100mg  twice a day and 2 at bedtime 360 capsule 3   Glucagon  3 MG/DOSE POWD Place 3 mg into the nose once as needed for up to 1 dose. 1 each 11   glucose blood (ONETOUCH ULTRA) test strip Use as instructed 2x a day 100 strip 11   HUMULIN  R 500 UNIT/ML injection INJECT SUBCUTANEOUSLY UP TO 200  UNITS IN THE INSULIN  PUMP DAILY 60 mL 3   HYDROcodone -acetaminophen  (NORCO/VICODIN) 5-325 MG tablet Take 1 tablet by mouth every 4 (four) hours as needed for moderate pain (pain score 4-6). 60 tablet 0   Insulin  Disposable Pump (OMNIPOD 5 DEXG7G6 INTRO GEN 5) KIT USE AS DIRECTED 1 kit 0   Insulin  Disposable Pump (OMNIPOD 5 DEXG7G6 PODS GEN 5) MISC USE 1 POD EVERY OTHER DAY 45 each 3   JARDIANCE  10 MG TABS tablet TAKE 1 TABLET BY MOUTH DAILY  BEFORE BREAKFAST 90 tablet 3   LUMIGAN 0.01 % SOLN Place 1 drop into both eyes at bedtime.      methocarbamol (ROBAXIN) 500 MG tablet Take 1  tablet every 8 hours by oral route as needed.     methotrexate (RHEUMATREX) 2.5 MG tablet Take by mouth.     metoprolol  succinate (TOPROL -XL) 25 MG 24 hr tablet TAKE 1 AND 1/2 TABLETS BY MOUTH  DAILY 135 tablet 3   mometasone  (ELOCON ) 0.1 % cream Apply 1 Application topically daily.     ondansetron  (ZOFRAN -ODT) 4 MG disintegrating tablet DISSOLVE 1 TABLET ON THE TONGUE  EVERY 6 HOURS AS NEEDED FOR  NAUSEA AND VOMITING 180 tablet 0   OneTouch Delica Lancets 33G MISC Use 2x a day 100 each 11   pantoprazole  (PROTONIX ) 40 MG tablet Take 1 tablet (40 mg total) by mouth 2 (two) times daily. 180 tablet 3   potassium chloride  SA (KLOR-CON  M) 20 MEQ tablet Take 1 tablet (20 mEq total) by mouth daily. 90 tablet 3   predniSONE  (DELTASONE ) 10 MG tablet Take 3 pills once daily by mouth for 3 days, then 2 pills once daily for 3 days, then 1 pill  once daily for 3 days and then stop 18 tablet 0   prochlorperazine  (COMPAZINE ) 10 MG tablet Take 1 tablet (10 mg total) by mouth every 6 (six) hours as needed. 12 tablet 0   rosuvastatin  (CRESTOR ) 10 MG tablet TAKE 1 TABLET BY MOUTH DAILY 90 tablet 2   SKYRIZI PEN 150 MG/ML pen Inject into the skin.     traZODone  (DESYREL ) 100 MG tablet TAKE 2 TABLETS BY MOUTH EVERY  NIGHT AT BEDTIME 180 tablet 3   triamterene -hydrochlorothiazide  (MAXZIDE -25) 37.5-25 MG tablet TAKE 1 TABLET BY MOUTH ONCE  DAILY 90 tablet 0   valACYclovir  (VALTREX ) 1000 MG tablet TAKE 2 TABLETS BY MOUTH AT ONSET OF SYMPTOMS AS NEEDED FOR FEVER  BLISTERS AND TAKE 2 TABLETS BY  MOUTH 12 HOURS LATER 90 tablet 3   No current facility-administered medications on file prior to visit.   Allergies  Allergen Reactions   Humalog [Insulin  Lispro] Swelling   Lisinopril Cough   Metformin  Other (See Comments)   Nitrofurantoin Other (See Comments)    Pt unsure   Oxycodone  Itching   Oxycodone -Acetaminophen  Itching   Oxycodone -Acetaminophen  Itching and Nausea Only   Atorvastatin  Diarrhea    May have caused  colitis--but not clear cut   Clarithromycin Nausea Only   Glipizide  Other (See Comments)    Sugars too variable   Invokana  [Canagliflozin ]     Nausea, constipation   Family History  Problem Relation Age of Onset   Diabetes Mother    Heart disease Mother    Hypertension Mother    Ovarian cancer Mother    Melanoma Mother    Cancer Father    Coronary artery disease Father    Heart disease Father    Diabetes Sister    Depression Sister    Coronary artery disease Sister    Heart disease Sister    Pulmonary Hypertension Sister    Rheum arthritis Maternal Aunt    Heart attack Paternal Uncle    Heart attack Paternal Uncle    Breast cancer Maternal Grandmother    Diabetes Maternal Grandmother    Diabetes Maternal Grandfather    Heart attack Cousin    Colon cancer Neg Hx    PE: BP 120/64   Pulse 98   Ht 5' 4 (1.626 m)   Wt 178 lb 3.2 oz (80.8 kg)   SpO2 95%   BMI 30.59 kg/m  Wt Readings from Last 10 Encounters:  07/14/24 178 lb 3.2 oz (80.8 kg)  06/02/24 175 lb 8 oz (79.6 kg)  05/21/24 181 lb (82.1 kg)  05/20/24 182 lb 6.4 oz (82.7 kg)  03/19/24 175 lb 6.4 oz (79.6 kg)  03/17/24 177 lb 6.4 oz (80.5 kg)  03/11/24 174 lb (78.9 kg)  01/10/24 173 lb (78.5 kg)  11/06/23 175 lb 12.8 oz (79.7 kg)  10/30/23 176 lb (79.8 kg)   Constitutional: normal weight, in NAD Eyes: EOMI, no exophthalmos ENT: no thyromegaly, no cervical lymphadenopathy Cardiovascular: tachycardia, RR, No MRG Respiratory: CTA B Musculoskeletal: no deformities Skin: no rashes Neurological: +tremor with outstretched hands  ASSESSMENT: 1. DM2, insulin -dependent, uncontrolled, without long-term complications, but with significant hyperglycemia  2. HL  3. Obesity class 1  4.   PLAN:  1. Patient with history of poorly controlled type 2 diabetes, insulin  resistant, previously on basal/bolus insulin  regimen, and currently on the OmniPod 5 insulin  pump integrated with the Dexcom CGM and with U-500  insulin  in the pump.  At last visit, HbA1c was higher, at 8.1%.  Sugars  appeared to be increasing after breakfast and they remained elevated with occasional drops in blood sugars later in the day, before dinner.  Sugars after dinner were not significantly changed but they were increasing after approximately 8:30-9 PM, when she was eating snacks.  I strongly advised her to try to stop the snacks but if she was eating them, to bolus for them.  I also recommended a stronger insulin  to carb ratio in the morning as sugars were increasing after breakfast despite the fact that she was usually bolusing for this meal.  CGM interpretation: -At today's visit, we reviewed her CGM downloads: It appears that 51% of values are in target range (goal >70%), while 48% are higher than 180 (goal <25%), and 1% are lower than 70 (goal <4%).  The calculated average blood sugar is 182.  The projected HbA1c for the next 3 months (GMI) is 7.7%. -Reviewing the CGM trends, sugars are not much changed from before, improving in the second half of the night and early morning, and then increasing significantly after her meals, with the highest peaks being around 2 PM and around 1 AM. -Reviewing her pump downloads, she is mostly entering carbs in the pump before breakfast, much less frequently before lunch, and almost never before dinner which is likely the cause for her blood sugars increasing significantly after lunch and dinner.  At today's visit we discussed again that unless she enters carbs and bolusing for meals, the pump will not be able to completely counteract the very high blood sugars that she can develop postprandially. -We also discussed at today's visit about possibly trying the islet insulin  pump, for which she would only need to announce the meals, not enter the exact number of carbs she is eating.  She agrees with this, especially as she saw excellent results in her daughter, who recently started to use it. -I  suggested: Patient Instructions  Please use the following pump settings: - basal rates: 12 am: 1.0  3 am: 0.9  6 am: 0.7  9 am: 1 9 PM: 1.1  - ICR:  (start the bolus 45-60 min before the meal)  12 AM: 1:8  12 pm: 1:10  4 PM: 1:10 8 PM: 1:8  - target: 120-120  - ISF: 1:60 - Active insulin  time: 6 hours  Also, continue: - Jardiance  10 mg before b'fast.  Try to bolus before all the meals and snacks.  Check with your insurance if the iLet pump is covered.  Please return in 3-4 months.  - we checked her HbA1c: 7.2% (lower) - advised to check sugars at different times of the day - 4x a day, rotating check times - advised for yearly eye exams >> she is UTD - return to clinic in 3-4 months  2. HL - Latest lipid panel showed all fractions at goal 4 months ago: Lab Results  Component Value Date   CHOL 157 03/11/2024   HDL 61.30 03/11/2024   LDLCALC 67 03/11/2024   LDLDIRECT 116.0 03/12/2023   TRIG 142.0 03/11/2024   CHOLHDL 3 03/11/2024  -She continues Crestor  5 mg daily without side effects  3. Obesity class 1 - She previously inquired about a medication that may help with weight loss.  Unfortunately, she was not able to tolerate even oral GLP-1 receptor agonist and we already have her on an SGLT2 inhibitor. - At last visit she agreed with a referral to the weight management clinic.  She went for a weigh-in but her weight was slightly  under there thresholds.  At today's visit, her BMI is still above 30 so we discussed about going back to have another weigh-in with.  4.  Thyroid  nodule -I reviewed patient's recent thyroid  ultrasound results along with the patient.  The thyroid  gland is not enlarged and it contains a 1 nodule measuring 1 cm in the largest dimension.  This is isoechoic and spongiform, pointing towards benignity.  No need to follow this by imaging but we will continue to monitor her clinically. -She was worried about her heat intolerance being related to the  thyroid  but I explained that this nodule is not causing any symptoms as her thyroid  function is normal.   Lela Fendt, MD PhD [facility]

## 2024-07-14 NOTE — Patient Instructions (Addendum)
 Please use the following pump settings: - basal rates: 12 am: 1.0  3 am: 0.9  6 am: 0.7  9 am: 1 9 PM: 1.1  - ICR:  (start the bolus 45-60 min before the meal)  12 AM: 1:8  12 pm: 1:10  4 PM: 1:10 8 PM: 1:8  - target: 120-120  - ISF: 1:60 - Active insulin  time: 6 hours  Also, continue: - Jardiance  10 mg before b'fast.  Try to bolus before all the meals and snacks.  Check with your insurance if the iLet pump is covered.  Please return in 3-4 months.

## 2024-07-16 NOTE — Addendum Note (Signed)
 Addended by: CLEOTILDE ROLIN RAMAN on: 07/16/2024 04:19 PM   Modules accepted: Orders

## 2024-07-17 ENCOUNTER — Telehealth: Payer: Self-pay | Admitting: Internal Medicine

## 2024-07-17 ENCOUNTER — Ambulatory Visit (INDEPENDENT_AMBULATORY_CARE_PROVIDER_SITE_OTHER): Admitting: Clinical

## 2024-07-17 DIAGNOSIS — F331 Major depressive disorder, recurrent, moderate: Secondary | ICD-10-CM

## 2024-07-17 NOTE — Telephone Encounter (Signed)
 Attempted to call, vm full. Sent MyChart message.

## 2024-07-17 NOTE — Telephone Encounter (Unsigned)
 Copied from CRM #[id number]. Topic: General - Other >> Jul 17, 2024  9:34 AM Gennette ORN wrote: Reason for CRM: Patient calling in  to cancel appointment.

## 2024-07-17 NOTE — Progress Notes (Signed)
   [name], LCSW

## 2024-07-17 NOTE — Progress Notes (Signed)
 [facility] Counselor/Therapist Progress Note  Patient ID: Shannon [name], MRN: [id number],    Date: 07/17/2024  Time Spent: 2:44pm - 3:35pm : 51 minutes   Treatment Type: Individual Therapy  Reported Symptoms: Patient reported recent improvement in patient's mood  Mental Status Exam: Appearance:  Neat and Well Groomed     Behavior: Appropriate  Motor: Normal  Speech/Language:  Clear and Coherent and Normal Rate  Affect: Appropriate  Mood: normal  Thought process: normal  Thought content:   WNL  Sensory/Perceptual disturbances:   Patient reported sore throat, headache, and reported feeling weak  Orientation: oriented to person, place, time/date, and situation  Attention: Good  Concentration: Good  Memory: WNL  Fund of knowledge:  Good  Insight:   Good  Judgment:  Good  Impulse Control: Good   Risk Assessment: Danger to Self:  No Patient denied current suicidal ideation  Self-injurious Behavior: No Danger to Others: No Patient denied current homicidal ideation Duty to Warn:no Physical Aggression / Violence:No  Access to Firearms a concern: No  Gang Involvement:No   Subjective: Patient stated, it's going pretty good in response to events since last session. Patient stated, danny (husband) and I have had more time together. Patient reported recent conflict with husband regarding a statement husband made and patient stated, I said no I'm not going to let this slide. Patient reported patient vocalized to husband she did not appreciate the way husband spoke to patient. Patient stated, I realized I've got to start taking the chance of laying a little boundary down here or there and stated, I felt good about it, It felt good. Patient stated, I've got to try to feel good about what I can do instead of what I can't do.  Patient stated, its been better in response to patient's mood since last session. Patient reported patient went shopping for patient and  stated,  I think it lifted me up and made me feel better. Patient stated,, Its not as bad as it was in reference to feelings of loneliness. Patient stated, my state of mind is good today.  Patient reported patient's sister in law almost drowned in a pool last week and patient stated, it triggered something in me that I can not lose somebody else. Patient reported patient has spent time sitting on the side of the pool with her feet in the water, sitting outside in the sun, and stated I found that I felt much better doing that. Patient reported patient's husband completed positive qualities survey and provided the following feedback: patient is a great wife, mother, grandmother, loving and caring person, honest, generous, has empathy for others, beautiful. Patient stated, it just floored me, that is one thing that has really lifted me up, I can't tell you what that means in response to husband's feedback. Patient stated I felt appreciated, I felt like he has the feelings inside of him, I was thankful that he felt that way about me. Patient stated, things are feeling better, that was a boost for me.   Interventions: Cognitive Behavioral Therapy. Clinician conducted session via caregility video from clinician's office at [facility]. Patient provided verbal consent to proceed with telehealth session and is aware of limitations of telephone or video visits. Patient participated in session from patient's home. Reviewed events since last session and assessed for changes. Discussed recent conflict with husband and processed patient's response to conflict. Reviewed patient's participation in positive activities since last session and the impact on patient's mood.  Provided psycho education related to depression and interventions utilized to treat depression. Discussed sister in law's health and thoughts triggered by situation. Explored strategies to increase patient's participation in  positive activities, such as, fishing, sitting by pond, going to local lake, riding in boat at lake. Reviewed patient's positive qualities survey results. Processed patient's response to positive qualities survey feedback. Clinician requested for homework patient develop a list of positive activities to incorporate in patient's activities.    Collaboration of Care: not required at this time   Diagnosis:  Major depressive disorder, recurrent episode, moderate (HCC)     Plan: Patient is to utilize [employer] Therapy, thought re-framing, mindfulness and coping strategies to decrease symptoms associated with their diagnosis. Frequency: bi-weekly  Modality: individual      Long-term goal:   Reduce overall level, frequency, and intensity of the feelings of depression as evidenced by decrease in grief, loss of interest, depressed mood, isolation, increased sleep, low energy, psychomotor retardation, fatigue, feelings of guilt, lack of concentration, loss of motivation from 7 days/week to 0 to 1 days/week per patient report for at least 3 consecutive months. Target Date: 05/21/25  Progress: progressing    Short-term goal:  Begin healthy grieving process as it relates to multiple losses patient has experienced (mother, father, sister, mother in law) Target Date: 05/21/25  Progress: progressing    Develop and implement effective communication strategies for patient to utilize when expressing her thoughts and feelings to others in a controlled and assertive way  Target Date: 05/21/25  Progress: progressing    Increase patient's participation in positive activities from 0 days per week to 3 per day week Target Date: 05/21/25  Progress: progressing    Develop and implement strategies to increase feelings of closeness in patient's relationship with husband Target Date: 05/21/25  Progress: progressing    Increase communication/contact with patient's children Target Date: 05/21/25  Progress:  progressing                      [name], LCSW

## 2024-07-29 ENCOUNTER — Ambulatory Visit (INDEPENDENT_AMBULATORY_CARE_PROVIDER_SITE_OTHER): Admitting: Clinical

## 2024-07-29 DIAGNOSIS — F331 Major depressive disorder, recurrent, moderate: Secondary | ICD-10-CM | POA: Diagnosis not present

## 2024-07-29 NOTE — Progress Notes (Unsigned)
   [name], LCSW

## 2024-07-29 NOTE — Progress Notes (Unsigned)
 [facility] Counselor/Therapist Progress Note  Patient ID: Shannon [name], MRN: [id number],    Date: 07/29/2024  Time Spent: 1:39pm - 2:36pm : 57 minutes   Treatment Type: Individual Therapy  Reported Symptoms: depressed mood  Mental Status Exam: Appearance:  Neat and Well Groomed     Behavior: Appropriate  Motor: Normal  Speech/Language:  Clear and Coherent and Normal Rate  Affect: Tearful  Mood: depressed  Thought process: normal  Thought content:   WNL  Sensory/Perceptual disturbances:   WNL  Orientation: oriented to person, place, time/date, and situation  Attention: Good  Concentration: Good  Memory: WNL  Fund of knowledge:  Good  Insight:   Good  Judgment:  Good  Impulse Control: Good   Risk Assessment: Danger to Self:  No Patient denied current suicidal ideation  Self-injurious Behavior: No Danger to Others: No Patient denied current homicidal ideation Duty to Warn:no Physical Aggression / Violence:No  Access to Firearms a concern: No  Gang Involvement:No   Subjective: Patient stated, I got danny (husband) to take me to the lake in resposne to events since last session. Patient stated, I was just happy to be on the water. Patient reported husabdn was ready to leave the lake and patient was not ready to leave. Patient stated, he (husband) ended up getting angry with me and was angry with me for the rest of the day in reference to leaving hte lake. Patient stated, it just upset me in reference to husband's reaction. Patient reproted patient recently went out to eat with daughter and went shopping with daughter. Patient stated, we had a really relaxing good time in reference to time with daughter. Patietn reported yesterday patient spent time with patient's other daughter. Patient stated, I've been reaching out to tell them (children) I've been really desparately need this. Patient reproted patinet recently went to patient's pond to fish and  fished for a few minutes. Pateint stated, I'm trying to force myself in reference to partipcation in activities. Patient reported patient's husabnd has been distant recently. Patient stated, everything tied together, it's making me feel insecure. Patient reproted concerned husbadn feels neglected. Pateint stated, I feel very neglected, it started making me feel is he feelign as neglected as I am. Patietn reproted patient's youngest son visited patinet over the past two Sundays and patient initiated a conversation with son to disclose patient's feelings. Patient stated, I felt like we had a good talk and he gave me insight on something I didn't think he saw, It gave me some comfort. Patient stated, I felt good about it in resposen to homework assiggment. Pateint stated, right now I'm feelign really down. Patient stated, I do jumpt to conclusions, I do get hurt and immediately my defenses fly up.    Interventions: Cognitive Behavioral Therapy. Clinician conducted session via caregility video from clinician's office at [facility]. Patient provided verbal consent to proceed with telehealth session and is aware of limitations of telephone or video visits. Patient participated in session from patient's home. Reviewed events since last session and assessed for changes. Reveiwed patient's homework assignment and the outcome.  Explored triggers/cognitive distortions triggering thoughts. Observed to patietn magnification, personalization, assumption thought patterns. Discussed concept of transference.Discussed conversation with son. Challenged stateemtns/thougtsh to assist patient in reframing situations. Provided psycoh education re: cognitive disotrtions and challenging distortions.   Last -  Clinician conducted session via caregility video from clinician's office at [facility]. Patient provided verbal consent to proceed with telehealth session  and is aware of limitations of  telephone or video visits. Patient participated in session from patient's home. Reviewed events since last session and assessed for changes. Discussed recent conflict with husband and processed patient's response to conflict. Reviewed patient's participation in positive activities since last session and the impact on patient's mood. Provided psycho education related to depression and interventions utilized to treat depression. Discussed sister in law's health and thoughts triggered by situation. Explored strategies to increase patient's participation in positive activities, such as, fishing, sitting by pond, going to local lake, riding in boat at lake. Reviewed patient's positive qualities survey results. Processed patient's response to positive qualities survey feedback. Clinician requested for homework patient develop a list of positive activities to incorporate in patient's activities.    Collaboration of Care: not required at this time   Diagnosis:  Major depressive disorder, recurrent episode, moderate (HCC)     Plan: Patient is to utilize [employer] Therapy, thought re-framing, mindfulness and coping strategies to decrease symptoms associated with their diagnosis. Frequency: bi-weekly  Modality: individual      Long-term goal:   Reduce overall level, frequency, and intensity of the feelings of depression as evidenced by decrease in grief, loss of interest, depressed mood, isolation, increased sleep, low energy, psychomotor retardation, fatigue, feelings of guilt, lack of concentration, loss of motivation from 7 days/week to 0 to 1 days/week per patient report for at least 3 consecutive months. Target Date: 05/21/25  Progress: progressing    Short-term goal:  Begin healthy grieving process as it relates to multiple losses patient has experienced (mother, father, sister, mother in law) Target Date: 05/21/25  Progress: progressing    Develop and implement effective communication  strategies for patient to utilize when expressing her thoughts and feelings to others in a controlled and assertive way  Target Date: 05/21/25  Progress: progressing    Increase patient's participation in positive activities from 0 days per week to 3 per day week Target Date: 05/21/25  Progress: progressing    Develop and implement strategies to increase feelings of closeness in patient's relationship with husband Target Date: 05/21/25  Progress: progressing    Increase communication/contact with patient's children Target Date: 05/21/25  Progress: progressing                      [name], LCSW

## 2024-08-01 ENCOUNTER — Telehealth: Payer: Self-pay | Admitting: Dietician

## 2024-08-01 NOTE — Telephone Encounter (Signed)
 Returned patient call. She has an appointment with me at the end of the month. She states that this is for iLet insulin  pump training. She needs to change this appointment if possible due to her daughter having a medical test that day that she wishes to be present for.  Explained that I am not certified on the iLet pump. She is currently using an Omnipod 5 pump with U-500 insulin .  Called and spoke to Jolan Bohr Mudlogger with [employer]).  She will do the training.  Patient to call her to arrange.  Patient states that she wishes to get off the U500 insulin  and wants to confirm that this will be the insulin  used in the pump.  Explained that she likely will stay on the U500  insulin  as she is currently using this in the Omnipod pump.  Will message MD to confirm per patient wishes.  Reviewed chart and noted that patient is on U500 due to allergies to Humalog/Lyumjev  and insurance wouldn't cover novolog .  [name], RD, LDN, CDCES, DipACLM

## 2024-08-05 ENCOUNTER — Telehealth: Payer: Self-pay | Admitting: Dietician

## 2024-08-05 NOTE — Telephone Encounter (Signed)
 Patient's daughter Shannon [name] who is also a patient of Dr. Trixie) called regarding her pump for training on the iLet pump tomorrow with the [employer] trainer.  Shannon [name] currently is using this pump.  She is currently using a receiver with the Dexcom G6 CGM - wishes to change to the Dexcom G7 sensors.  Daughter uses prefilled Fiasp  in her Beta Bionic and asks if her mother can uses this.  Chart reviewed.  Patient is now using U-500 insulin  as Novolog  was not covered by her insurance and patient is allergic to Humalog/Lyumjev  resulting in significant irritation and inlamation at the infusion site.  Will message MD on call regarding insulin  for training and Dr. Trixie for prescription requests.  [name], RD, LDN, CDCES, DipACLM

## 2024-08-06 ENCOUNTER — Other Ambulatory Visit: Payer: Self-pay

## 2024-08-06 MED ORDER — FIASP 100 UNIT/ML IJ SOLN
INTRAMUSCULAR | 3 refills | Status: DC
  Filled 2024-08-06: qty 20, 40d supply, fill #0

## 2024-08-06 MED ORDER — FIASP PUMPCART 100 UNIT/ML ~~LOC~~ SOCT
SUBCUTANEOUS | 3 refills | Status: DC
  Filled 2024-08-06: qty 50, fill #0

## 2024-08-06 MED ORDER — FIASP PUMPCART 100 UNIT/ML ~~LOC~~ SOCT: SUBCUTANEOUS | 3 refills | Status: DC

## 2024-08-06 NOTE — Addendum Note (Signed)
 Addended by: CLEOTILDE ROLIN RAMAN on: 08/06/2024 01:54 PM   Modules accepted: Orders

## 2024-08-06 NOTE — Telephone Encounter (Signed)
 Hi [name],   I believe the Fiasp  is a better option with the Ilet pump. When you say  prefilled are you talking abut pens ? Do you want me to send a prescription for Pens ? Or vials are ok ?

## 2024-08-07 ENCOUNTER — Other Ambulatory Visit: Payer: Self-pay | Admitting: Internal Medicine

## 2024-08-07 MED ORDER — LANTUS SOLOSTAR 100 UNIT/ML ~~LOC~~ SOPN: 25.0000 [IU] | PEN_INJECTOR | Freq: Every day | SUBCUTANEOUS | 99 refills | Status: DC

## 2024-08-07 MED ORDER — DEXCOM G7 SENSOR MISC: 3.0000 | 4 refills | Status: DC

## 2024-08-07 MED ORDER — INSULIN PEN NEEDLE 32G X 4 MM MISC: 3 refills | Status: AC

## 2024-08-11 ENCOUNTER — Other Ambulatory Visit: Payer: Self-pay | Admitting: Internal Medicine

## 2024-08-11 ENCOUNTER — Other Ambulatory Visit: Payer: Self-pay

## 2024-08-11 ENCOUNTER — Telehealth: Payer: Self-pay | Admitting: Dietician

## 2024-08-11 MED ORDER — FIASP 100 UNIT/ML IJ SOLN
INTRAMUSCULAR | 11 refills | Status: AC
  Filled 2024-08-11 – 2024-08-12 (×2): qty 90, 31d supply, fill #0
  Filled 2024-08-14 (×2): qty 30, 12d supply, fill #0
  Filled 2024-09-02: qty 30, 12d supply, fill #1
  Filled 2024-10-09: qty 30, 12d supply, fill #2
  Filled 2024-11-05: qty 30, 12d supply, fill #3
  Filled 2024-12-09: qty 30, 12d supply, fill #4

## 2024-08-11 NOTE — Telephone Encounter (Signed)
 Returned patient call. She is asking about the prescription for Dexcom G7.  Discussed that this has been sen 08/07/24 to [facility] for home delivery.  She was training on the iLet pump 9/17.  She has just finished the mandatory time of announcing all meals as usual and has started to see some improved control today but she states it is still high and is hoping for continued improvement.  Dexcom Clarity reviewed.  Fasting 84 this am and post meal 277 today.  She needs more insulin . She is having to change the set every day due to high insulin  use. She is currently using fiasp  in a vial but would like the prefilled Fiasp  pumpcart due to her hands (psoriatic arthritis). Patient is also asking if she should resume the U-500 in the iLet.    She needs a script for insulin .  Will forward to MD.  [name], RD, LDN, CDCES, DipACLM

## 2024-08-12 ENCOUNTER — Other Ambulatory Visit: Payer: Self-pay

## 2024-08-12 ENCOUNTER — Other Ambulatory Visit (HOSPITAL_COMMUNITY): Payer: Self-pay

## 2024-08-12 ENCOUNTER — Telehealth: Payer: Self-pay

## 2024-08-12 ENCOUNTER — Ambulatory Visit (INDEPENDENT_AMBULATORY_CARE_PROVIDER_SITE_OTHER): Admitting: Clinical

## 2024-08-12 DIAGNOSIS — F331 Major depressive disorder, recurrent, moderate: Secondary | ICD-10-CM

## 2024-08-12 NOTE — Progress Notes (Signed)
   [name], LCSW

## 2024-08-12 NOTE — Telephone Encounter (Signed)
 Pharmacy Patient Advocate Encounter   Received notification from CoverMyMeds that prior authorization for Fiasp  PenFill 100UNIT/ML cartridges is required/requested.   Insurance verification completed.   The patient is insured through [employer] .   Per test claim: Plan Exclusion Alt Neu:Ylfjonh KwikPen, Insulin  Lispro KwikPen/vial (unbranded Humalog), Lyumjev  KwikPen

## 2024-08-12 NOTE — Progress Notes (Signed)
 [facility] Counselor/Therapist Progress Note  Patient ID: Shannon [name], MRN: [id number],    Date: 08/12/2024  Time Spent: 11:35am - 12:20pm : 45 minutes   Treatment Type: Individual Therapy  Reported Symptoms: depressed mood, crying, loneliness, negative self talk  Mental Status Exam: Appearance:  Neat and Well Groomed     Behavior: Appropriate  Motor: Normal  Speech/Language:  Clear and Coherent and Normal Rate  Affect: Tearful  Mood: depressed  Thought process: normal  Thought content:   WNL  Sensory/Perceptual disturbances:   WNL  Orientation: oriented to person, place, time/date, and situation  Attention: Good  Concentration: Good  Memory: WNL  Fund of knowledge:  Good  Insight:   Good  Judgment:  Good  Impulse Control: Good   Risk Assessment: Danger to Self:  No Patient denied current suicidal ideation  Self-injurious Behavior: No Danger to Others: No Patient denied current homicidal ideation Duty to Warn:no Physical Aggression / Violence:No  Access to Firearms a concern: No  Gang Involvement:No   Subjective: Patient stated, part of the time its been going pretty good, right now I seem to be in a slump again in response to events since last session. Patient reported feelings of loneliness, decreased motivation, sadness and stated, I'm not really sure in response to triggers for recent decline in mood. Patient stated, Its like a roller coaster, Im just struggling, I'm just depressed. Patient stated, its hard for me to come up with things that would add to my life.  Patient stated, I was dreading today. Patient stated, I just know there is so much emotion I'm not going to be able to contain it in reference to dreading today's appointment. Patient stated, I'm tired of this loneliness and stated, I feel its the same ol rut in reference to relationship with husband. Patient reported recent concern related to patient's retina detaching and  patient reported an upcoming appointment with a specialist. Patient stated, that's been kind of a frightening thing for me, that's got me a little down, I can't say it doesn't. Patient stated, I guess I'm feeling forgotten a whole lot. Patient stated, I talked to my husband and I told him I thought I was going to have to go on some medications and he said id be careful with that.  Patient stated, I haven't contacted them (psychiatrist), I was on and off the fence, I need to contact them. Patient stated, It doesn't take much to hold me back from something in reference to consultation with psychiatrist. Patient stated, change and new things are kind of hard for me. Patient reported fear of new medications. Patient stated, I have not started that at all, I've been really struggling and down, I just don't want to put it on paper, I know I need to in response to homework assignment. Patient stated, I have to deal with the feelings and the thoughts in reference to barriers to completing homework assignment. Patient stated, I feel like I have to shut it down in reference to patient's thoughts and feelings.  Patient reported a need to be loved and reported the need has been present since childhood. Patient stated, I've never felt like enough. Patient stated,  I'm going to try to push myself to make that phone call consultation with psychiatrist.   Interventions: Cognitive Behavioral Therapy. Clinician conducted session via caregility video from clinician's office at [facility]. Patient provided verbal consent to proceed with telehealth session and is aware of limitations of telephone  or video visits. Patient participated in session from patient's home. Reviewed events since last session and assessed for changes. Assisted patient in exploring and identifying triggers for recent decline in mood. Explored patient's statement regarding dreading today's appointment. Clinician  reflected back to patient observations in thought patterns and negative self talk. Provided psycho education related to depression and treatment. Discussed referral to psychiatrist. Explored barriers to following up with consultation with psychiatrist. Reviewed homework. Explored and identified barriers to completing homework assignment. Clinician requested for homework patient complete a gratitude journal and complete thought record.      Collaboration of Care: not required at this time   Diagnosis:  Major depressive disorder, recurrent episode, moderate (HCC)     Plan: Patient is to utilize [employer] Therapy, thought re-framing, mindfulness and coping strategies to decrease symptoms associated with their diagnosis. Frequency: bi-weekly  Modality: individual      Long-term goal:   Reduce overall level, frequency, and intensity of the feelings of depression as evidenced by decrease in grief, loss of interest, depressed mood, isolation, increased sleep, low energy, psychomotor retardation, fatigue, feelings of guilt, lack of concentration, loss of motivation from 7 days/week to 0 to 1 days/week per patient report for at least 3 consecutive months. Target Date: 05/21/25  Progress: progressing    Short-term goal:  Begin healthy grieving process as it relates to multiple losses patient has experienced (mother, father, sister, mother in law) Target Date: 05/21/25  Progress: progressing    Develop and implement effective communication strategies for patient to utilize when expressing her thoughts and feelings to others in a controlled and assertive way  Target Date: 05/21/25  Progress: progressing    Increase patient's participation in positive activities from 0 days per week to 3 per day week Target Date: 05/21/25  Progress: progressing    Develop and implement strategies to increase feelings of closeness in patient's relationship with husband Target Date: 05/21/25  Progress:  progressing    Increase communication/contact with patient's children Target Date: 05/21/25  Progress: progressing                  [name], LCSW

## 2024-08-14 ENCOUNTER — Telehealth: Payer: Self-pay

## 2024-08-14 ENCOUNTER — Other Ambulatory Visit: Payer: Self-pay

## 2024-08-14 ENCOUNTER — Telehealth: Payer: Self-pay | Admitting: Dietician

## 2024-08-14 ENCOUNTER — Other Ambulatory Visit (HOSPITAL_COMMUNITY): Payer: Self-pay

## 2024-08-14 MED ORDER — DEXCOM G7 SENSOR MISC: 3.0000 | 4 refills | Status: AC

## 2024-08-14 NOTE — Telephone Encounter (Signed)
 Patient called and left message and states that she is confused about where her diabetes supplies are to come from.   Returned patient call.  He was not available and left the following message:  Fiasp  pump (prefilled) has been sent to [facility] in [place]. Dexcom G7 sensors have been sent to [facility] on 08/07/2024.  Patient to call for further questions.  [name], RD, LDN, CDCES, DipACLM

## 2024-08-14 NOTE — Telephone Encounter (Signed)
 Patient states that she was not aware that the Lantus  was sent into the pharmacy. Would like to know why it was sent. I advise it usually for back up when pump is not working but she wants me to send message to you.

## 2024-08-14 NOTE — Telephone Encounter (Signed)
 PA has been submitted. Key: [id number]

## 2024-08-14 NOTE — Telephone Encounter (Signed)
 Patient needs PA on the Fiasp  asap for her pump.

## 2024-08-14 NOTE — Telephone Encounter (Signed)
 I did speak with  [facility] and they were able to get the patient 3 vials for today by 4:30pm with a coupon they have on file. Dexcom sensors placed up front.

## 2024-08-14 NOTE — Telephone Encounter (Signed)
 Patient made aware of Dr. Trixie recommendations and verbalized understanding.

## 2024-08-15 NOTE — Telephone Encounter (Signed)
 Fiasp  has been denied as it excluded from coverage. Patient did pick up 3 vials from pharmacy with the coupon. Would you like to change medicaiton

## 2024-08-15 NOTE — Telephone Encounter (Signed)
Patient came in to office today and picked up 2 samples of Dexcom G7 sensors.

## 2024-08-18 ENCOUNTER — Other Ambulatory Visit (HOSPITAL_COMMUNITY): Payer: Self-pay

## 2024-08-18 ENCOUNTER — Ambulatory Visit: Admitting: Dietician

## 2024-08-19 ENCOUNTER — Other Ambulatory Visit (HOSPITAL_COMMUNITY): Payer: Self-pay

## 2024-08-19 NOTE — Telephone Encounter (Signed)
 Actually, just reviewing my last note, she has an allergy to Humalog, lyumjev  - so may need a PA for Fiasp  or Novolog .SABRA

## 2024-08-19 NOTE — Telephone Encounter (Signed)
 Shannon [name], Since she is allergic to Humalog and Lyumjev  (actually listed as an allergy for her), can we resubmit? Thank you! CG

## 2024-08-20 ENCOUNTER — Ambulatory Visit: Admitting: Clinical

## 2024-08-20 ENCOUNTER — Telehealth: Payer: Self-pay | Admitting: Internal Medicine

## 2024-08-20 DIAGNOSIS — F331 Major depressive disorder, recurrent, moderate: Secondary | ICD-10-CM | POA: Diagnosis not present

## 2024-08-20 MED ORDER — FIASP FLEXTOUCH 100 UNIT/ML ~~LOC~~ SOPN: PEN_INJECTOR | SUBCUTANEOUS | Status: DC

## 2024-08-20 NOTE — Progress Notes (Signed)
 Rheumatology Follow Up Note  Chief Complaint  Patient presents with  . Psoriatic Arthritis      Subjective:HPI  Shannon [name] is a 68 y.o. female is here today for follow up of psoriatic arthritis, moderate. The patient's allergies, current medications, past family history, past medical history, past social history, past surgical history and problem list were reviewed and updated as appropriate.   She is taking the Skyrizi, methotrexate and folic acid. She is tolerating this medication well. The psoriasis is better. The Nelma is not lasting but six weeks. This does help the pains for the first six weeks. She is taking the gabapentin .She has back and hip pains as well as hands and shoulders.She is able to form a fist. She gets swelling of the hands and stiffness after the six weeks. She tries to stretch daily.  She has no fever, dyspnea, nasal/oral ulcer or bloody diarrhea.   She has noticed palpitations today. She will call her cardiologist for further evaluation.   Review of Systems:   Review of Systems  Constitutional:  Positive for fatigue.  HENT:  Negative for mouth sores and trouble swallowing.        Neg: Dry Mouth  Eyes:  Positive for redness.       Neg: Dry Eyes  Respiratory:  Negative for cough and shortness of breath.   Cardiovascular:  Positive for palpitations. Negative for chest pain and leg swelling.  Gastrointestinal:  Negative for constipation, diarrhea and nausea.  Endocrine: Negative for cold intolerance and heat intolerance.  Genitourinary:  Negative for hematuria.  Musculoskeletal:        Per HPI  Skin:  Negative for color change and rash.  Neurological:  Positive for weakness and headaches. Negative for dizziness and numbness.  Hematological:  Does not bruise/bleed easily.  Psychiatric/Behavioral:  Negative for dysphoric mood and sleep disturbance. The patient is not nervous/anxious.   All other systems reviewed and are negative.   Objective:  Vitals:    08/20/24 1436  BP: 125/71  Pulse: 91  Temp: 36.4 C (97.5 F)  Weight: 81.6 kg (180 lb)  Height: 162.6 cm (5' 4)  PainSc:   6  PainLoc: Back    Length of Stiffness: One Hour   GEN - Pleasant, No Apparent Distress  HEENT - normocephalic and atraumatic. Conjunctiva Clear.  Neck - supple with no adenopathy or thyromegaly.   C spine with full range of motion. Heart - regular rate and rhythm, No murmurs/gallops/rub, Nml S1S2 Lungs - clear to auscultation in all fields. Extremities - there is no cyanosis or edema. Neurological - alert and oriented.  Spine - no paraspinal tenderness; L Spine Tenderness Skin - Right 3rd PIP Medial aspect with dry rash, left elbow with dry scaly rash, occipital region with dry scaly rash with improvement, right inner ear with psoriasis with improvement   MSK - The following joints were examined bilaterally: Hands, Wrists, Elbows, Shoulders, Metatarsals, Ankels, Knees and Hips; they were normal apart from what is noted.    PIP tenderness, bony enlargement Both Shoulders with Tenderness, Pain on ROM  Both hip with tenderness Both Knees with Tenderness No Dactylitis 10 Tender Point ______________________________________________________________________ Labs/Imaging Reviewed in EMR Cr 0.81, AST 23, ALT 23 CBC Hgb 13.5, Hct 41.9, Plt 294 HgA1c 7.2 Uric Acid 6.3 CRP 3 Immunoglobulin (IgG 465 (L)), (IgE < 2.0), IgA (Normal) Pos: ANA 1:80; HLAB27 Neg: RF, AntiCCP, 14.3.3 ETA Rheum, RNP, SSA, SSB, Smith Neg: Hep B and C; Quantiferon   Lumbar  Spine Xray: L5-S1 Degenerative Changes   MRI Lumbar Spine (03/2018) There is mild retrolisthesis of L5 on S1. The vertebral bodies are otherwise in normal alignment. There is a T11 hemangioma. The vertebral body heights are maintained. There are no suspicious osseous lesions. There is mild multilevel disc desiccation and disc height loss at L5-S1. The conus medullaris terminates at a normal level. There is a small  left renal cyst.  At T11-L1, there is no significant spinal canal or neural foraminal narrowing.  At L1-2, there is a small disc bulge without significant spinal canal or neural foraminal narrowing.  At L2-3, there is no significant spinal canal or neural foraminal narrowing.  At L3-4, there is a disc bulge with a superimposed left subarticular and foraminal protrusion with an annular fissure. There is no significant spinal canal or neural foraminal narrowing.  At L4-5, there is a small disc bulge and mild facet arthropathy without significant spinal canal or neural foraminal narrowing.  At L5-S1, there is a disc bulge and mild facet arthropathy which results in mild narrowing of the lateral recesses. There is no significant spinal canal narrowing. There is mild bilateral neural foraminal narrowing.    MRI Right Ankle (10/2020): 1. Intact Achilles tendon without significant tendinosis.  2. Chronic plantar fasciitis.   CDAI is calculated as follows:  CDAI = SJC + TJC +PGA + EGA = 15 Where:  SJC = Swollen Joint Count TJC = Tender Joint Count PGA = Patient Global Assessment of Disease Activity EGA = Evaluator Global Assessment of Disease Activity  Interpretation <=2.8: Remission >2.8 and <=10: Low Disease Activity >10 and <=22: Moderate Disease Activity >22: High Disease Activity    Assessment and Plan   1. Psoriasis with Psoriatic arthritis, Spondyloarthritis: Active  -- Diagnosed 07/2021; Psoriasis of scalp, elbow and hands; Synovitis of hand joints with tenderness; Right achilles pain, plantar fasciitis; lower back pain and stiffness (worse in the morning). She also has HLAB27.  -- Failed Cosentyx -- Failed Cimzia Infusion (09/2021-12/2021); Was doing well initially; Taltz (Yeast Infection) -- Continue methotrexate 20 mg and folic acid 1 mg daily -- Skyrizi Every 12 weeks working well but not lasting the full time -- Start Tremfya LD followed 100 mg Every Every 8 Weeks    2.  Positive ANA -- Inflammatory arthritis (I think this is due to above), Sicca Syndrome -- Negative SSA, SSB    3. Myalgia/lumbar spondylsosis -- She does have few tender points on exam. -- Did not tolerate Tylenol  #3; caused pruritis  -- Continue Gabapentin  100 mg twice daily; she is concerned about weight gain -- On Norco per PCP -- She is followed by [facility].    4. Low Immunoglobulin Level -- Followed by Immunologist, Dr.Jessica MacDougall  -- Monitor due to starting immunosuppressive therapy -- Check Immunoglobulins   5. Long term use of immunosuppressive therapy -- Skyrizi is an immunosuppressive therapy that requires drug toxicity monitoring -- Methotrexate is an immunosuppressive medication that require drug toxicity monitoring.  -- Palpitations: She will contact her cardiologist for further assessment.  -- Check Labs   Diagnoses and all orders for this visit:  Psoriatic arthritis (CMS/HHS-HCC) -     CBC w/auto Differential (5 Part) -     Aspartate Aminotransferase (AST) -     Albumin -     Alanine Aminotransferase (ALT) -     Creatinine -     Sedimentation Rate-Westergren - [facility]  Psoriasis  Myalgia  Lumbar spondylosis  Long-term use of immunosuppressant  medication -     CBC w/auto Differential (5 Part) -     Aspartate Aminotransferase (AST) -     Albumin -     Alanine Aminotransferase (ALT) -     Creatinine -     Immunoglobulins A/E/G/M, Serum - [facility]  Heart palpitations       Return in about 4 months (around 12/21/2024) for Routine Follow Up.   All new prescription medications, changes in current prescription dosages, and sample medications were discussed with the patient, including patient education, medication name, use, dosage, potential side effects, drug interactions, consequences of not using/taking, and special instructions.  Patient expressed understanding.  No barriers to adherence.   I appreciate the opportunity to participate in the  care of Shannon [name]. Please do not hesitate to contact me with any questions or concerns that may arise in regards to the patient's rheumatologic disease.   I personally performed the service. (TP)  Shannon LOREE BLANCH, MD

## 2024-08-20 NOTE — Telephone Encounter (Signed)
 Patient c/o Palpitations:  STAT if patient reporting lightheadedness, shortness of breath, or chest pain  How long have you had palpitations/irregular HR/ Afib? Are you having the symptoms now? Several weeks, yes  Are you currently experiencing lightheadedness, SOB or CP? Sob and headache  Do you have a history of afib (atrial fibrillation) or irregular heart rhythm? Yes  irregular  Have you checked your BP or HR? (document readings if available): 124/78  Are you experiencing any other symptoms? All of the above  **patient is in lobby

## 2024-08-20 NOTE — Telephone Encounter (Signed)
 Left vm and sent mychart message.

## 2024-08-20 NOTE — Progress Notes (Unsigned)
 [facility] Counselor/Therapist Progress Note  Patient ID: Shannon [name], MRN: [id number],    Date: 08/20/2024  Time Spent: 11:31am - 12:33pm : 62 minutes   Treatment Type: Individual Therapy  Reported Symptoms: depressed mood  Mental Status Exam: Appearance:  Neat and Well Groomed     Behavior: Appropriate  Motor: Normal  Speech/Language:  Clear and Coherent and Normal Rate  Affect: Flat  Mood: depressed  Thought process: normal  Thought content:   WNL  Sensory/Perceptual disturbances:   WNL  Orientation: oriented to person, place, time/date, and situation  Attention: Good  Concentration: Good  Memory: WNL  Fund of knowledge:  Good  Insight:   Good  Judgment:  Good  Impulse Control: Good   Risk Assessment: Danger to Self:  No Patient denied current suicidal ideation  Self-injurious Behavior: No Danger to Others: No Patient denied current homicidal ideation Duty to Warn:no Physical Aggression / Violence:No  Access to Firearms a concern: No  Gang Involvement:No   Subjective: Patient stated, I made my mind up I was going to really try to push forward and keep doing things, but unfortunately I was kind of shot down in response to events since last session. Patient reported patient's husband has been avoiding patient and husband's answers to patient have been minimal. Patient stated, it kind of brought me down, I didn't really know what to do with that in response to husband's behaviors. Patient reported patient went shopping with daughter and reported feelings of guilt in when performing self care due to husband being at home. Patient stated, he does this kind of thing to me quite often in reference to husband's response. Patient stated, he had been uptight with me for two days at that point. Patient reported patient didn't attend church in response to husband's behaviors. Patient reported patient's ex-husband walking through patient's yard to take  grandchildren fishing is a potential trigger for conflict. Patient stated, I don't know how to ever fix this situation by myself in reference to relationship with husband. Patient stated, I don't know how to stop myself at taking it personal. Patient reported patient attended therapy for 2 years to address issues in previous marriage. Patient stated, I know this is only going to get better to the extent of what I can change myself. Patient stated, I don't know what to do at this point. Patient stated, I came across a letter from 20 years ago and it was asking for the same things still. Patient reported concern husband displays traits of narcissim. Patient stated, I'm ok in response to current mood.  Interventions: Cognitive Behavioral Therapy. Clinician conducted session via caregility video from clinician's office at [facility]. Patient provided verbal consent to proceed with telehealth session and is aware of limitations of telephone or video visits. Patient participated in session from patient's home. Reviewed events since last session and assessed for changes. Reviewed patient's participation in positive activities since last session and discussed barriers to participation. Provided supportive therapy, active listening as patient discussed recent conflict with husband and patient's response. Discussed patterns of behaviors as it relates to husband's behaviors/change in communication. Explored triggers for recent conflict between patient/husband and assisted patient in generating alternatives to thoughts associate with conflict. Explored feelings of guilt triggered by participation in positive activities/self care. Explored patient's expectations as it relates to husband's responses and realistic expectations. Discussed previous relationship and patient's decision to end the relationship. Provided psycho education related to use of I statements and fair  fighting rules. Discussed concept  of defense mechanisms, such as, projection, denial, deflection. Provided psycho education related narcissistic personality disorder. Clinician requested for homework patient complete a gratitude journal and complete thought record.    Collaboration of Care: not required at this time   Diagnosis:  Major depressive disorder, recurrent episode, moderate (HCC)     Plan: Patient is to utilize [employer], thought re-framing, mindfulness and coping strategies to decrease symptoms associated with their diagnosis. Frequency: bi-weekly  Modality: individual      Long-term goal:   Reduce overall level, frequency, and intensity of the feelings of depression as evidenced by decrease in grief, loss of interest, depressed mood, isolation, increased sleep, low energy, psychomotor retardation, fatigue, feelings of guilt, lack of concentration, loss of motivation from 7 days/week to 0 to 1 days/week per patient report for at least 3 consecutive months. Target Date: 05/21/25  Progress: progressing    Short-term goal:  Begin healthy grieving process as it relates to multiple losses patient has experienced (mother, father, sister, mother in law) Target Date: 05/21/25  Progress: progressing    Develop and implement effective communication strategies for patient to utilize when expressing her thoughts and feelings to others in a controlled and assertive way  Target Date: 05/21/25  Progress: progressing    Increase patient's participation in positive activities from 0 days per week to 3 per day week Target Date: 05/21/25  Progress: progressing    Develop and implement strategies to increase feelings of closeness in patient's relationship with husband Target Date: 05/21/25  Progress: progressing    Increase communication/contact with patient's children Target Date: 05/21/25  Progress: progressing               [name], LCSW

## 2024-08-20 NOTE — Progress Notes (Unsigned)
   [name], LCSW

## 2024-08-20 NOTE — Addendum Note (Signed)
 Addended by: OCTAVIO DIETRICH CROME on: 08/20/2024 01:05 PM   Modules accepted: Orders

## 2024-08-20 NOTE — Telephone Encounter (Signed)
 Patient will pick up a sample of the Fiasp  pen and she would like to stay on the Fiasp  for now and pay the $64 without insurance and decide on the other options later. She doesn't qualify for patient assistance.

## 2024-08-20 NOTE — Telephone Encounter (Signed)
 patient came in as a walk in c/o palpitations, was at rheumatologist earlier who advised her to fu with cardiology with concern for a fib.. ugh. felt her pulse, equal in rhythm. asked about chest pain and sob, patient endorses yes to both symptoms. recommended going to the ER for the c/o chest pain, but she denied wanting to go. so appt made for 1055 tomorrow with you. also said last week one night was sitting in bed and had the same feeling accompanied by left arm and neck pain and tingling in her fingers. told her could be angina but will get a EKG tomorrow at visit. she was ok with that and knows to go to ED if symptoms worsen.

## 2024-08-21 ENCOUNTER — Ambulatory Visit (INDEPENDENT_AMBULATORY_CARE_PROVIDER_SITE_OTHER)

## 2024-08-21 ENCOUNTER — Encounter: Payer: Self-pay | Admitting: Cardiology

## 2024-08-21 ENCOUNTER — Ambulatory Visit: Attending: Cardiology | Admitting: Cardiology

## 2024-08-21 VITALS — BP 125/73 | HR 87 | Ht 64.0 in | Wt 178.8 lb

## 2024-08-21 DIAGNOSIS — E785 Hyperlipidemia, unspecified: Secondary | ICD-10-CM

## 2024-08-21 DIAGNOSIS — I2510 Atherosclerotic heart disease of native coronary artery without angina pectoris: Secondary | ICD-10-CM | POA: Diagnosis not present

## 2024-08-21 DIAGNOSIS — I700 Atherosclerosis of aorta: Secondary | ICD-10-CM

## 2024-08-21 DIAGNOSIS — R002 Palpitations: Secondary | ICD-10-CM

## 2024-08-21 DIAGNOSIS — I340 Nonrheumatic mitral (valve) insufficiency: Secondary | ICD-10-CM

## 2024-08-21 DIAGNOSIS — I10 Essential (primary) hypertension: Secondary | ICD-10-CM | POA: Diagnosis not present

## 2024-08-21 DIAGNOSIS — E1169 Type 2 diabetes mellitus with other specified complication: Secondary | ICD-10-CM

## 2024-08-21 DIAGNOSIS — R5383 Other fatigue: Secondary | ICD-10-CM

## 2024-08-21 NOTE — Progress Notes (Signed)
 Cardiology Office Note   Date:  08/21/2024  ID:  Shannon [name], DOB 29-Jun-1956, MRN [id number] PCP: Jimmy Charlie FERNS, MD  [facility] Providers Cardiologist:  Lonni Hanson, MD     History of Present Illness Shannon [name] is a 68 y.o. female with a past medical history of nonobstructive CAD by coronary CTA, mild mitral regurgitation, aortic atherosclerosis, hypertension, hyperlipidemia, type 2 diabetes, gastroesophageal reflux disease, who presents today for follow-up after having recent complaints of chest pain and shortness of breath.   She was previously followed by Dr. Monette with evaluation in 2018 for nonexertional chest pain.  Echo at that time showed an EF of 55 to 60%, no regional wall motion abnormalities, mild LVH, grade 1 diastolic dysfunction, mitral valve with systolic bowing without prolapse, trivial mitral regurgitation, and normal RV systolic function.  Treadmill MPI in 11/2016 showed no significant ischemia with an EF of 66% and was overall low risk.  Due to persisting symptoms she underwent LHC in 11/2016 that showed minimal luminal irregularities affecting the LAD with no evidence of obstructive CAD and normal LV systolic function with mildly elevated LVEDP.  Echo from 03/2021 showed an EF of 50 to 55%, no regional wall motion abnormalities, grade 1 diastolic dysfunction, normal RV systolic function and ventricular cavity size, normal RVSP, mild mitral regurgitation, and an estimated right atrial pressure of 8 mmHg.  She was last seen in the office in 02/2024 noting a several month history of exertional fatigue, chest discomfort, and dyspnea.  She reported her hands and feet would become swollen if she forgot to take her furosemide .  Coronary CTA in 03/2024 showed a calcium  score of 156 which was the 85th percentile.  There was 25 to 49% proximal LAD stenosis.  Echo in 04/2024 showed an EF of 60 to 65%, no regional wall motion abnormalities, grade 1 diastolic dysfunction,  normal RV systolic function and ventricular cavity size, mild mitral regurgitation, and an estimated right atrial pressure of 3 mmHg.    She was last seen in clinic 05/20/2024 without symptoms of angina or cardiac decompensation.  She reports she was awoken at 4 AM with a headache and typically had 3 to 4 days of headaches per week.  Indicates she generally just did not feel well today.  She continued to note swelling in her hands and feet if she did not take furosemide .  Longstanding palpitations were largely stable.  No near-syncope or syncope.  She continues to report generalized malaise and fatigue.  She has been checking biomarkers for why she felt so poorly.  She was encouraged to follow-up with her PCP to discuss further testing was indicated.  It was suspected that her swelling was related to sodium intake and possible vasodilatation in the hot temperatures.  It was not suspected this was a primary cardiac etiology and would not aggressively diurese.  She presented to the clinic on 08/20/2024 with complaints of palpitations after being evaluated by her rheumatologist.  He told her that he was worried about atrial fibrillation and that she should present to her cardiologist office to be evaluated for atrial fibrillation because treatment was different and she would need blood thinners because of blood to stay in the heart for an extended period of time and she can make the clots, stroke.  She showed up at the office to be seen in the walk-in unfortunately was unable to be seen and had to be scheduled for today.  She presents today with continued complaints  of palpitations that have been ongoing for approximately 3 weeks.  She has fatigue and weakness.  She had 1 episode of left arm and neck pain she was unsure what it was related to accident no alleviating or aggravating factors has not happened with rest and is spontaneously resolved.  She has not had any reoccurrence of with exertion.  They have just  recently changed her insulin  therapy and long quantity of the side effects are irregular, palpitations, and cardiovascular side effects.  She has several questions today.  She states that she has been compliant with her current medication regimen.  She denies any recent hospitalizations or visits to the emergency department.  ROS: 10 point review of systems has been reviewed and considered negative with exception was been listed in the HPI  Studies Reviewed EKG Interpretation Date/Time:  Thursday August 21 2024 10:50:55 EDT Ventricular Rate:  87 PR Interval:  132 QRS Duration:  88 QT Interval:  388 QTC Calculation: 466 R Axis:   11  Text Interpretation: Normal sinus rhythm Normal ECG When compared with ECG of 20-May-2024 14:14, No significant change was found Confirmed by Gerard Frederick ([id number]) on 08/21/2024 10:56:09 AM    2D echo 04/24/2024: 1. Left ventricular ejection fraction, by estimation, is 60 to 65%. The  left ventricle has normal function. The left ventricle has no regional  wall motion abnormalities. Left ventricular diastolic parameters are  consistent with Grade I diastolic  dysfunction (impaired relaxation). The average left ventricular global  longitudinal strain is -19.3 %. The global longitudinal strain is normal.   2. Right ventricular systolic function is normal. The right ventricular  size is normal.   3. The mitral valve is normal in structure. Mild mitral valve  regurgitation.   4. The aortic valve is tricuspid. Aortic valve regurgitation is not  visualized.   5. The inferior vena cava is normal in size with greater than 50%  respiratory variability, suggesting right atrial pressure of 3 mmHg.    Coronary CTA 04/10/2024: FINDINGS: Aorta:  Normal size.  No calcifications.  No dissection.   Aortic Valve:  Trileaflet.  No calcifications.   Coronary Arteries:  Normal coronary origin.  Right dominance.   RCA is a dominant artery. There is no plaque.   Left main  gives rise to LAD and LCX arteries. LM has no disease.   LAD has calcified plaque proximally causing mild stenosis (25-49%).   LCX is a non-dominant artery.  There is no plaque.   Other findings:   Normal pulmonary vein drainage into the left atrium.   Normal left atrial appendage without a thrombus.   Normal size of the pulmonary artery.   IMPRESSION: 1. Coronary calcium  score of 156. This was 85th percentile for age and sex matched control.   2. Normal coronary origin with right dominance.   3. Mild proximal LAD stenosis (25-49%).   4. CAD-RADS 2. Mild non-obstructive CAD (25-49%). Consider preventive therapy and risk factor modification.   2D echo 03/22/2021: 1. Left ventricular ejection fraction, by estimation, is 50 to 55%. The  left ventricle has low normal function. The left ventricle has no regional  wall motion abnormalities. Left ventricular diastolic parameters are  consistent with Grade I diastolic  dysfunction (impaired relaxation).   2. Right ventricular systolic function is normal. The right ventricular  size is normal. There is normal pulmonary artery systolic pressure.   3. The mitral valve is normal in structure. Mild mitral valve  regurgitation.   4. The  aortic valve is tricuspid. Aortic valve regurgitation is not  visualized. No aortic stenosis is present.   5. The inferior vena cava is normal in size with <50% respiratory  variability, suggesting right atrial pressure of 8 mmHg.   Comparison(s): EF 55-60%, mild thickening of MV leaflets, no MVP seen only  bowing.    LHC 12/18/2016: The left ventricular systolic function is normal. LV end diastolic pressure is mildly elevated. The left ventricular ejection fraction is 55-65% by visual estimate.   1. Minimal luminal irregularities affecting the LAD. No evidence of obstructive coronary artery disease. 2. Normal LV systolic function. Mildly elevated left ventricular end-diastolic pressure.    Recommendations: The chest pain seems to be noncardiac.   Treadmill MPI 12/05/2016: Exercise myocardial perfusion imaging study with no significant ischemia Normal wall motion, EF estimated at 66% No EKG changes concerning for ischemia at peak stress or in recovery. Target heart rate achieved Low risk scan   2D echo 11/29/2016: - Left ventricle: The cavity size was normal. Wall thickness was    increased in a pattern of mild LVH. Systolic function was normal.    The estimated ejection fraction was in the range of 55% to 60%.    Wall motion was normal; there were no regional wall motion    abnormalities. Doppler parameters are consistent with abnormal    left ventricular relaxation (grade 1 diastolic dysfunction).  - Mitral valve: Systolic bowing without prolapse. There was trivial    regurgitation.  - Right ventricle: The cavity size was normal. Wall thickness was    normal. Systolic function was normal.    2D echo 02/06/2013: - Left ventricle: The cavity size was normal. Wall thickness    was normal. Systolic function was normal. The estimated    ejection fraction was in the range of 50% to 55%. Wall    motion was normal; there were no regional wall motion    abnormalities. Left ventricular diastolic function    parameters were normal.  - Mitral valve: Mild to moderate regurgitation.  - Right ventricle: Systolic function was normal.  - Tricuspid valve: Mild-moderate regurgitation.  - Pulmonary arteries: Systolic pressure is mildly elevated    consistent with mild pulmonary HTN. PA peak pressure: 35mm    Hg (S).  Risk Assessment/Calculations         Physical Exam VS:  BP 125/73 (BP Location: Left Arm, Patient Position: Sitting, Cuff Size: Normal)   Pulse 87   Ht 5' 4 (1.626 m)   Wt 178 lb 12.8 oz (81.1 kg)   SpO2 98%   BMI 30.69 kg/m        Wt Readings from Last 3 Encounters:  08/21/24 178 lb 12.8 oz (81.1 kg)  07/14/24 178 lb 3.2 oz (80.8 kg)  06/02/24 175 lb 8 oz  (79.6 kg)    GEN: Well nourished, well developed in no acute distress NECK: No JVD; No carotid bruits CARDIAC: RRR, no murmurs, rubs, gallops RESPIRATORY:  Clear to auscultation without rales, wheezing or rhonchi  ABDOMEN: Soft, non-tender, non-distended EXTREMITIES:  No edema; No deformity   ASSESSMENT AND PLAN Nonobstructive coronary artery disease without symptoms suggestive of angina or anginal equivalents.  Coronary CTA in 03/2024 showed nonobstructive coronary artery disease with 25 to 49% LAD stenosis.  Recommend continuing with aggressive risk factor modification and primary prevention including aspirin  81 mg daily continued on rosuvastatin  10 mg daily.  EKG completed today.  Sinus sinus rhythm with a rate of 87 with no acute  ischemic changes noted.  Mitral regurgitation noted on prior echocardiogram demonstrated mitral valve bowing with prolapse in 2018 with trivial regurg.  Mild regurgitation on echo in 2022.  Most recent echo on 04/2024 showed mild mitral regurgitation monitor with periodic echocardiograms.  Primary hypertension with a blood pressure today 125/73.  Blood pressures remain stable.  She has been continued on triamterene -HCTZ 37.5/25 mg daily and Toprol -XL 37.5 mg daily and furosemide  20 mg daily.  She has been encouraged to continue to monitor pressures 1 to 2 hours postmedication administration as well.  Aortic atherosclerosis/hyperlipidemia with an LDL 67 in April 2025 with normal AST/ALT at that time.  She continues to remain on rosuvastatin  10 mg daily.  Palpitations that have been longstanding and now progressive.  She is currently on Toprol -XL 37.5 mg daily with being placed on a ZIO XT monitor for 7 days to rule out atrial fibrillation as suggested recently by her rheumatologist.  Generalized malaise and fatigue which does not appear to be primary cardiac in etiology.  She has been encouraged to continue to maintain follow-up with her PCP after recent changes in her  medications for her diabetes is unlikely a contributing factor.  Headache disorder that has been stable and unchanged.  Type 2 diabetes with multiple diabetic medications and recent changes with ongoing management by her PCP.       Dispo: Patient to return to clinic to see MD/APP in 4 weeks or sooner if needed for further evaluation of symptoms   Signed, Samona Chihuahua, NP

## 2024-08-21 NOTE — Patient Instructions (Signed)
 Medication Instructions:  Your physician recommends that you continue on your current medications as directed. Please refer to the Current Medication list given to you today.   *If you need a refill on your cardiac medications before your next appointment, please call your pharmacy*  Lab Work: No labs ordered today  If you have labs (blood work) drawn today and your tests are completely normal, you will receive your results only by: MyChart Message (if you have MyChart) OR A paper copy in the mail If you have any lab test that is abnormal or we need to change your treatment, we will call you to review the results.  Testing/Procedures: GEOFFRY HEWS- Long Term Monitor Instructions  Your physician has requested you wear a ZIO patch monitor for 7 days.  This is a single patch monitor. [employer] supplies one patch monitor per enrollment. Additional stickers are not available. Please do not apply patch if you will be having a Nuclear Stress Test,  Echocardiogram, Cardiac CT, MRI, or Chest Xray during the period you would be wearing the  monitor. The patch cannot be worn during these tests. You cannot remove and re-apply the  ZIO XT patch monitor.  Your ZIO patch monitor will be mailed 3 day USPS to your address on file. It may take 3-5 days  to receive your monitor after you have been enrolled.  Once you have received your monitor, please review the enclosed instructions. Your monitor  has already been registered assigning a specific monitor serial # to you.  Billing and Patient Assistance Program Information  We have supplied [employer] with any of your insurance information on file for billing purposes. [employer] offers a sliding scale Patient Assistance Program for patients that do not have  insurance, or whose insurance does not completely cover the cost of the ZIO monitor.  You must apply for the Patient Assistance Program to qualify for this discounted rate.  To apply, please call [employer] at  [phone], select option 4, select option 2, ask to apply for  Patient Assistance Program. Meredeth will ask your household income, and how many people  are in your household. They will quote your out-of-pocket cost based on that information.  [employer] will also be able to set up a 87-month, interest-free payment plan if needed.  Applying the monitor   Shave hair from upper left chest.  Hold abrader disc by orange tab. Rub abrader in 40 strokes over the upper left chest as  indicated in your monitor instructions.  Clean area with 4 enclosed alcohol pads. Let dry.  Apply patch as indicated in monitor instructions. Patch will be placed under collarbone on left  side of chest with arrow pointing upward.  Rub patch adhesive wings for 2 minutes. Remove white label marked 1. Remove the white  label marked 2. Rub patch adhesive wings for 2 additional minutes.  While looking in a mirror, press and release button in center of patch. A small green light will  flash 3-4 times. This will be your only indicator that the monitor has been turned on.  Do not shower for the first 24 hours. You may shower after the first 24 hours.  Press the button if you feel a symptom. You will hear a small click. Record Date, Time and  Symptom in the Patient Logbook.  When you are ready to remove the patch, follow instructions on the last 2 pages of Patient  Logbook. Stick patch monitor onto the last page of Patient Logbook.  Place  Patient Logbook in the blue and white box. Use locking tab on box and tape box closed  securely. The blue and white box has prepaid postage on it. Please place it in the mailbox as  soon as possible. Your physician should have your test results approximately 7 days after the  monitor has been mailed back to [facility].  Call [facility] Customer Care at [phone] if you have questions regarding  your ZIO XT patch monitor. Call them immediately if you see an orange light  blinking on your  monitor.  If your monitor falls off in less than 4 days, contact our Monitor department at [phone].  If your monitor becomes loose or falls off after 4 days call [employer] at [phone] for  suggestions on securing your monitor   Follow-Up: At [facility], you and your health needs are our priority.  As part of our continuing mission to provide you with exceptional heart care, our providers are all part of one team.  This team includes your primary Cardiologist (physician) and Advanced Practice Providers or APPs (Physician Assistants and Nurse Practitioners) who all work together to provide you with the care you need, when you need it.  Your next appointment:   1 month(s)  Provider:   You may see Lonni Hanson, MD or one of the following Advanced Practice Providers on your designated Care Team:   Lonni Meager, NP Lesley Maffucci, PA-C Bernardino Bring, PA-C Cadence [place], PA-C Tylene Lunch, NP Barnie Hila, NP

## 2024-08-22 ENCOUNTER — Other Ambulatory Visit: Payer: Self-pay | Admitting: Internal Medicine

## 2024-08-22 NOTE — Telephone Encounter (Signed)
 See other message

## 2024-08-22 NOTE — Telephone Encounter (Signed)
 Copied from CRM [id number]. Topic: Clinical - Medication Refill >> Aug 22, 2024 11:13 AM Robinson H wrote: Medication: HYDROcodone -acetaminophen  (NORCO/VICODIN) 5-325 MG tablet   Has the patient contacted their pharmacy? No, has to reach out to proivder (Agent: If no, request that the patient contact the pharmacy for the refill. If patient does not wish to contact the pharmacy document the reason why and proceed with request.) (Agent: If yes, when and what did the pharmacy advise?)  This is the patient's preferred pharmacy:    [facility] [address], [place] - [address] [place] [address] Phone: [phone] Fax: [phone]   Is this the correct pharmacy for this prescription? Yes If no, delete pharmacy and type the correct one.   Has the prescription been filled recently? No  Is the patient out of the medication? Yes  Has the patient been seen for an appointment in the last year OR does the patient have an upcoming appointment? Yes  Can we respond through MyChart? Yes  Agent: Please be advised that Rx refills may take up to 3 business days. We ask that you follow-up with your pharmacy.

## 2024-08-22 NOTE — Telephone Encounter (Signed)
 Name of Medication: Norco 5-325  Name of Pharmacy: [facility] [address]  Last Fill or Written Date and Quantity: 07/03/24 #60 0 rf  Last Office Visit and Type: 7/14 [place] sinus 05/21/24 PCP for f/u  Next Office Visit and Type: TOC [place]al 09/10/24 Last Controlled Substance Agreement Date: N/A  Last UDS: N/A

## 2024-08-22 NOTE — Addendum Note (Signed)
 Addended by: KALLIE CLOTILDA SQUIBB on: 08/22/2024 11:13 AM   Modules accepted: Orders

## 2024-08-24 MED ORDER — HYDROCODONE-ACETAMINOPHEN 5-325 MG PO TABS: 1.0000 | ORAL_TABLET | ORAL | 0 refills | Status: DC | PRN

## 2024-08-25 ENCOUNTER — Ambulatory Visit (INDEPENDENT_AMBULATORY_CARE_PROVIDER_SITE_OTHER): Admitting: Clinical

## 2024-08-25 DIAGNOSIS — F331 Major depressive disorder, recurrent, moderate: Secondary | ICD-10-CM | POA: Diagnosis not present

## 2024-08-25 NOTE — Progress Notes (Unsigned)
 [facility] Counselor/Therapist Progress Note  Patient ID: Shannon [name], MRN: [id number],    Date: 08/25/2024  Time Spent: 10:34am - 11:32am : 58 minutes   Treatment Type: Individual Therapy  Reported Symptoms: sadness  Mental Status Exam: Appearance:  Neat and Well Groomed     Behavior: Appropriate  Motor: Normal  Speech/Language:  Clear and Coherent and Normal Rate  Affect: Appropriate  Mood: Patient stated, I'll roll on with my day today and be ok, I'm fine in response to current mood.   Thought process: normal  Thought content:   WNL  Sensory/Perceptual disturbances:   WNL  Orientation: oriented to person, place, time/date, and situation  Attention: Fair  Concentration: Fair  Memory: WNL  Fund of knowledge:  Good  Insight:   Good  Judgment:  Good  Impulse Control: Good   Risk Assessment: Danger to Self:  No  Patient denied current suicidal ideation  Self-injurious Behavior: No Danger to Others: No Patient denied current homicidal ideation Duty to Warn:no Physical Aggression / Violence:No  Access to Firearms a concern: No  Gang Involvement:No   Subjective: Patient stated, he didn't mention anything and reported husband mentioned patient's ex-husband's changes to the property behind patient/husband's property. Patient reported patient's husband has a history of jealousy. Patient reported patient feels husband is upset by changes patient's ex-husband is making to the property behind patient/husband's property. Patient reported patient's husband is still upset and has been accusatory of patient. Patient reported patient did not engage in conversation when husband was accusing patient. Feels husband is punishing patient for not behaving in the manner husband believes patient should. Patient reported patient went out to eat with nephew and enjoyed the meal. Feels patient participation positive activities is triggering negative reaction. When he's  upset with me he's going to shut down, he's going to be short quick. Feels husband punishing patient is not healthy component of a relationship. Told husband she forgave him Feels controlled. Patient stated, I feel like I've lost me in reference to navigating husband's comfort in situations. Good re: gratitude journal - reviewed gratitude journal and patient shared gratitude entries. Discussed her response to gratitude journal - it was easy, it was just like an encouragement to myself.   Interventions: Cognitive Behavioral Therapy and Interpersonal. Clinician conducted session via caregility video from clinician's home office. Patient provided verbal consent to proceed with telehealth session and is aware of limitations of telephone or video visits. Patient participated in session from patient's home. Reviewed events since last session and assessed for changes.  Challenged statements.Challenged unhealthy components of relationship. - discussed forgiveness. Discussed boundaries in communication.  Reviewed gratitude journal and discussed her response to gratitude journal -  Last - Clinician conducted session via caregility video from clinician's office at [facility]. Patient provided verbal consent to proceed with telehealth session and is aware of limitations of telephone or video visits. Patient participated in session from patient's home. Reviewed events since last session and assessed for changes. Reviewed patient's participation in positive activities since last session and discussed barriers to participation. Provided supportive therapy, active listening as patient discussed recent conflict with husband and patient's response. Discussed patterns of behaviors as it relates to husband's behaviors/change in communication. Explored triggers for recent conflict between patient/husband and assisted patient in generating alternatives to thoughts associate with conflict. Explored feelings of guilt  triggered by participation in positive activities/self care. Explored patient's expectations as it relates to husband's responses and realistic expectations. Discussed previous relationship  and patient's decision to end the relationship. Provided psycho education related to use of I statements and fair fighting rules. Discussed concept of defense mechanisms, such as, projection, denial, deflection. Provided psycho education related narcissistic personality disorder. Clinician requested for homework patient complete a gratitude journal and complete thought record.    Collaboration of Care: not required at this time   Diagnosis:  Major depressive disorder, recurrent episode, moderate (HCC)     Plan: Patient is to utilize [employer], thought re-framing, mindfulness and coping strategies to decrease symptoms associated with their diagnosis. Frequency: bi-weekly  Modality: individual      Long-term goal:   Reduce overall level, frequency, and intensity of the feelings of depression as evidenced by decrease in grief, loss of interest, depressed mood, isolation, increased sleep, low energy, psychomotor retardation, fatigue, feelings of guilt, lack of concentration, loss of motivation from 7 days/week to 0 to 1 days/week per patient report for at least 3 consecutive months. Target Date: 05/21/25  Progress: progressing    Short-term goal:  Begin healthy grieving process as it relates to multiple losses patient has experienced (mother, father, sister, mother in law) Target Date: 05/21/25  Progress: progressing    Develop and implement effective communication strategies for patient to utilize when expressing her thoughts and feelings to others in a controlled and assertive way  Target Date: 05/21/25  Progress: progressing    Increase patient's participation in positive activities from 0 days per week to 3 per day week Target Date: 05/21/25  Progress: progressing    Develop and  implement strategies to increase feelings of closeness in patient's relationship with husband Target Date: 05/21/25  Progress: progressing    Increase communication/contact with patient's children Target Date: 05/21/25  Progress: progressing                 [name], LCSW

## 2024-08-25 NOTE — Progress Notes (Unsigned)
   [name], LCSW

## 2024-09-01 ENCOUNTER — Ambulatory Visit: Admitting: Clinical

## 2024-09-02 ENCOUNTER — Ambulatory Visit: Admitting: Clinical

## 2024-09-03 ENCOUNTER — Other Ambulatory Visit: Payer: Self-pay

## 2024-09-04 ENCOUNTER — Other Ambulatory Visit: Payer: Self-pay | Admitting: Internal Medicine

## 2024-09-08 ENCOUNTER — Ambulatory Visit: Admitting: Clinical

## 2024-09-08 DIAGNOSIS — F331 Major depressive disorder, recurrent, moderate: Secondary | ICD-10-CM | POA: Diagnosis not present

## 2024-09-08 NOTE — Progress Notes (Unsigned)
 [facility] Counselor/Therapist Progress Note  Patient ID: Shannon [name], MRN: [id number],    Date: 09/08/2024  Time Spent: 2:35pm - 3:31pm : 56 minutes   Treatment Type: Individual Therapy  Reported Symptoms: depressed mood, tearful  Mental Status Exam: Appearance:  Neat and Well Groomed     Behavior: Appropriate  Motor: Normal  Speech/Language:  Clear and Coherent and Normal Rate  Affect: Tearful  Mood: depressed  Thought process: normal  Thought content:   WNL  Sensory/Perceptual disturbances:   WNL  Orientation: oriented to person, place, time/date, and situation  Attention: Good  Concentration: Good  Memory: WNL  Fund of knowledge:  Good  Insight:   Good  Judgment:  Good  Impulse Control: Good   Risk Assessment: Danger to Self:  No Patient denied current suicidal ideation  Self-injurious Behavior: No Danger to Others: No Patient denied current homicidal ideation Duty to Warn:no Physical Aggression / Violence:No  Access to Firearms a concern: No  Gang Involvement:No   Subjective: Patient stated, very busy in response to events since last session. Patient reported patient has been ill since last session. Patient stated, I think my moods have been a lot better. Patient stated, danny (husband) is pretty much back to his normal, that's a huge part of my depression when things between us  are terrible. Patient stated,  I don't know how to change that in reference to the impact on patient's mood. Patient stated, all kinds of thoughts have been running through my head in response to conflict with husband. Patient reported experiencing catastrophizing thoughts. Patient stated, the only thing I can think of when he (husband) gets like that is to go somewhere in reference to participating in a positive activity. Patient stated,  I'm just going to have to start going out on a limb on my own and doing things. Patient stated,  It makes me feel less than  like I don't deserve it in reference to lack of physical touch/words of affirmation from husband. Patient reported patient grew up in a household that displayed affection and words of affirmation. Patient reported affection and words of affirmation were not displayed in the household husband grew up in. Patient stated, very little in reference to documentation in thought record.    Interventions: Cognitive Behavioral Therapy and Interpersonal. Clinician conducted session via caregility video from clinician's home office. Patient provided verbal consent to proceed with telehealth session and is aware of limitations of telephone or video visits. Patient participated in session from patient's home. Reviewed events since last session and assessed for changes. Discussed status of conflict and communication between patient/husband.  Explored strategies to decrease the impact of conflict on patient's mood. Explored and identified thoughts triggered by conflict with husband and lack of affection/words of affirmation. Discussed participation in a positive activity as a coping strategy. Explored and examined patient's expectations as it relates to marriage and interactions with husband. Challenged statements/thoughts to assist patient in reframing thoughts. Discussed patterns of behaviors and realistic expectations. Provided psycho education related to love languages. Explored and identified patient's love languages and patient's perception of husband's love languages. Clinician requested for homework patient continue gratitude journal and thought record.   Collaboration of Care: not required at this time   Diagnosis:  Major depressive disorder, recurrent episode, moderate (HCC)     Plan: Patient is to utilize [employer], thought re-framing, mindfulness and coping strategies to decrease symptoms associated with their diagnosis. Frequency: bi-weekly  Modality: individual  Long-term goal:    Reduce overall level, frequency, and intensity of the feelings of depression as evidenced by decrease in grief, loss of interest, depressed mood, isolation, increased sleep, low energy, psychomotor retardation, fatigue, feelings of guilt, lack of concentration, loss of motivation from 7 days/week to 0 to 1 days/week per patient report for at least 3 consecutive months. Target Date: 05/21/25  Progress: progressing    Short-term goal:  Begin healthy grieving process as it relates to multiple losses patient has experienced (mother, father, sister, mother in law) Target Date: 05/21/25  Progress: progressing    Develop and implement effective communication strategies for patient to utilize when expressing her thoughts and feelings to others in a controlled and assertive way  Target Date: 05/21/25  Progress: progressing    Increase patient's participation in positive activities from 0 days per week to 3 per day week Target Date: 05/21/25  Progress: progressing    Develop and implement strategies to increase feelings of closeness in patient's relationship with husband Target Date: 05/21/25  Progress: progressing    Increase communication/contact with patient's children Target Date: 05/21/25  Progress: progressing             [name], LCSW

## 2024-09-08 NOTE — Progress Notes (Unsigned)
   [name], LCSW

## 2024-09-10 ENCOUNTER — Encounter: Admitting: Family

## 2024-09-14 DIAGNOSIS — R002 Palpitations: Secondary | ICD-10-CM

## 2024-09-15 ENCOUNTER — Ambulatory Visit: Payer: Self-pay | Admitting: Cardiology

## 2024-09-15 ENCOUNTER — Ambulatory Visit: Admitting: Clinical

## 2024-09-15 NOTE — Progress Notes (Signed)
 Average heart rate 91 bpm, rare early beats were noted.  No sustained arrhythmias or pauses were noted.  No atrial fibrillation or atrial flutter was noted.  Overall benign monitor.

## 2024-09-17 ENCOUNTER — Ambulatory Visit: Admitting: Nurse Practitioner

## 2024-09-17 ENCOUNTER — Ambulatory Visit
Admission: RE | Admit: 2024-09-17 | Discharge: 2024-09-17 | Disposition: A | Discharge status: Home / Self Care | Attending: Nurse Practitioner | Admitting: Nurse Practitioner

## 2024-09-17 ENCOUNTER — Encounter: Payer: Self-pay | Admitting: Nurse Practitioner

## 2024-09-17 VITALS — BP 120/80 | HR 105 | Temp 97.6°F | Ht 64.0 in | Wt 174.0 lb

## 2024-09-17 DIAGNOSIS — G4733 Obstructive sleep apnea (adult) (pediatric): Secondary | ICD-10-CM

## 2024-09-17 DIAGNOSIS — R0609 Other forms of dyspnea: Secondary | ICD-10-CM

## 2024-09-17 DIAGNOSIS — J4520 Mild intermittent asthma, uncomplicated: Secondary | ICD-10-CM | POA: Diagnosis not present

## 2024-09-17 DIAGNOSIS — J019 Acute sinusitis, unspecified: Secondary | ICD-10-CM | POA: Insufficient documentation

## 2024-09-17 DIAGNOSIS — R000 Tachycardia, unspecified: Secondary | ICD-10-CM | POA: Insufficient documentation

## 2024-09-17 LAB — NITRIC OXIDE: Nitric Oxide: 5

## 2024-09-17 MED ORDER — ALBUTEROL SULFATE HFA 108 (90 BASE) MCG/ACT IN AERS: 2.0000 | INHALATION_SPRAY | Freq: Four times a day (QID) | RESPIRATORY_TRACT | 2 refills | Status: AC | PRN

## 2024-09-17 MED ORDER — CEFDINIR 300 MG PO CAPS: 300.0000 mg | ORAL_CAPSULE | Freq: Two times a day (BID) | ORAL | 0 refills | Status: AC

## 2024-09-17 MED ORDER — AZELASTINE HCL 0.1 % NA SOLN: 2.0000 | Freq: Two times a day (BID) | NASAL | 5 refills | Status: AC

## 2024-09-17 NOTE — Assessment & Plan Note (Signed)
 Chronic DOE. No acute worsening. Hx of asthma. See above. Never smoker. She does have psoriatic arthritis. No evidence of CT ILD on prior imaging from earlier this year. Will trial her on albuterol  and reassess response. Obtain PFT at follow up. Consider cardiopulmonary exercise test, given associated tachycardia, if PFTs unremarkable and no perceived benefit from SABA.

## 2024-09-17 NOTE — Assessment & Plan Note (Addendum)
 HR <110 bpm today in office. Per her report, she had elevated readings up to 120's last night. Possibly due to acute illness. Advised to push fluids and remain hydrated. Wells criteria low suspicion for PE. Advised to notify cardiology if increased HR persists. ED precautions reviewed.

## 2024-09-17 NOTE — Patient Instructions (Addendum)
 Trial Albuterol  inhaler 2 puffs every 6 hours as needed for shortness of breath or wheezing Continue flonase  nasal spray 2 sprays each nostril daily Continue over the counter cold and flu medication  Continue cetirizine daily  Cefdinir 1 tab Twice daily  for 7 days for sinus infection. Take with food Azelastine  nasal spray 2 sprays each nostril Twice daily for nasal congestion/drainage  We will set you up for some lung function testing  Let your heart doctor know about your elevated heart rate. It could be that it is just elevated because you are sick. Push plenty of fluids to ensure you are staying hydrated.   Continue to use CPAP every night, minimum of 4-6 hours a night.  Change equipment as directed. Wash your tubing with warm soap and water daily, hang to dry. Wash humidifier portion weekly. Use bottled, distilled water and change daily Be aware of reduced alertness and do not drive or operate heavy machinery if experiencing this or drowsiness.  Exercise encouraged, as tolerated. Healthy weight management discussed.  Avoid or decrease alcohol consumption and medications that make you more sleepy, if possible. Notify if persistent daytime sleepiness occurs even with consistent use of PAP therapy.  Change CPAP supplies... Every month Mask cushions and/or nasal pillows CPAP machine filters Every 3 months Mask frame (not including the headgear) CPAP tubing Every 6 months Mask headgear Chin strap (if applicable) Humidifier water tub  Orders placed for CPAP supplies  Follow up in 4-6 weeks after PFT with any new pulmonologist (30 min slot) to establish care. If symptoms do not improve or worsen, please contact office for sooner follow up or seek emergency care.

## 2024-09-17 NOTE — Assessment & Plan Note (Signed)
 See above. Does not seem asthma driven. No significant peripheral eosinophilia.

## 2024-09-17 NOTE — Progress Notes (Signed)
 @Patient  ID: Shannon [name], female    DOB: 05/14/1956, 68 y.o.   MRN: [id number]  Chief Complaint  Patient presents with   Obstructive Sleep Apnea    Using CPAP nightly. No problems with mask or pressure.    Asthma    Patient reports history of asthma. Cough and low oxygen level. Shortness of breath and elevated heart rate with activity.     Referring provider: Jimmy Charlie FERNS, MD  HPI: 68 year old female, never smoker followed for OSA on CPAP. She was last seen by Parrett,NP 10/2023. Past medical history significant for mitral valve disease, HTN, GERD, DM, HLD, asthma.   TEST/EVENTS:  01/12/2023 HST: AHI 56/h, SpO2 low 77%  10/30/2023: OV with Parrett,NP. Pt started on CPAP Therapy for severe OSA. Since last visit, doing well. Feels she was benefiting from CPAP with decreased sleepiness. Has been struggling with a sinus infection. Currently on Avelox  with 2-3 days left. Continues to have right sided sinus pressure, ear fullness and discharge. Recommended to see ENT. Advised to begin sinus rinses. Encouraged to increase consistently with CPAP after sinus symptoms resolve.   09/17/2024: Today - follow up/acute Discussed the use of AI scribe software for clinical note transcription with the patient, who gave verbal consent to proceed.  History of Present Illness Shannon [name] is a 68 year old female with sleep apnea and asthma who presents with concerns about her breathing.   She has a history of sleep apnea and uses a CPAP machine most nights. Recently, she experienced a break in usage due to issues with a sinus infection, which exacerbated her cough and caused nasal congestion. She has not been sleeping well during this period. She otherwise gets benefit from using CPAP. Improves daytime fatigue. No drowsy driving.   She has a history of asthma diagnosed in her late twenties, but she has not been on medication for it recently. She experiences increased shortness of breath and a  racing heart rate when exerting herself. This predated the current cold symptoms. She experiences chest pressure occasionally with the symptoms. She had a recent cardiac monitor that was overall benign.   She has a cough that she attributes to recent illness, with postnasal drainage being a significant issue. She uses over-the-counter cold and flu medication and Flonase  (fluticasone ) for symptom relief. She describes sinus tenderness and nasal discharge that started as clear and progressed to yellow and green over the past week. She has not used antibiotics recently. She denies any fevers, chills.   She has a history of mitral valve disease and some CAD, which is being monitored. She reports episodes of elevated heart rate, with her watch indicating a heart rate over 120 for about two and a half hours last night. No recent travel, coughing up blood, calf pain/swelling. Her oxygen levels have been stable, typically in the mid-nineties. No CP, palpitations, syncope, lightheadedness.   Lab Results  Component Value Date   NITRICOXIDE 5 09/17/2024   This result suggests low (<25) Type 2 (T2) airway inflammation indicating a low likelihood of active T2-driven airway inflammation; reduced probability of response to inhaled corticosteroids.      Allergies  Allergen Reactions   Humalog [Insulin  Lispro] Swelling   Lisinopril Cough   Metformin  Other (See Comments)   Nitrofurantoin Other (See Comments)    Pt unsure   Oxycodone  Itching   Oxycodone -Acetaminophen  Itching   Oxycodone -Acetaminophen  Itching and Nausea Only   Atorvastatin  Diarrhea    May have caused colitis--but  not clear cut   Clarithromycin Nausea Only   Glipizide  Other (See Comments)    Sugars too variable   Invokana  [Canagliflozin ]     Nausea, constipation    Immunization History  Administered Date(s) Administered   Influenza Split 10/19/2011, 09/16/2012   Influenza Whole 09/23/2007, 08/28/2008, 09/09/2009, 09/05/2010    Influenza, Seasonal, Injecte, Preservative Fre 07/26/2023   Influenza,inj,Quad PF,6+ Mos 09/22/2014, 08/09/2015, 11/16/2016, 09/03/2018, 09/09/2019, 10/05/2022   Influenza-Unspecified 09/04/2017, 09/03/2020, 10/11/2021   Moderna Sars-Covid-2 Vaccination 08/20/2020, 09/17/2020   Pneumococcal Conjugate-13 06/18/2017   Pneumococcal Polysaccharide-23 09/16/2012, 06/07/2021   Td 11/20/2004   Tdap 05/10/2015   Zoster Recombinant(Shingrix ) 12/17/2018, 04/15/2019    Past Medical History:  Diagnosis Date   Actinic keratosis    Allergy    Arthritis    hip, back, shoulders, joints   Asthma    Complication of anesthesia    problems with block during breast augmentation and epidural during childbirth   Depression    Diabetes mellitus 09/2011   type 2   Family history of adverse reaction to anesthesia    PROBLEMS WITH BLOCKS   Fatty liver    Fibromyalgia    GERD (gastroesophageal reflux disease)    Glaucoma suspect    Headache    migraines-4x a week headache-1x week migraine   History of hiatal hernia    Hyperlipidemia    Hypertension    Controlled on meds   Mitral valve prolapse    Nose colonized with MRSA 05/15/2018   Osteopenia    Pneumonia    PONV (postoperative nausea and vomiting)    Psoriasis    Shingles 11/2006   Sleep disturbance    Tachycardia    Vertigo    Hx of    Tobacco History: Social History   Tobacco Use  Smoking Status Never   Passive exposure: Past  Smokeless Tobacco Never   Counseling given: Not Answered   Outpatient Medications Prior to Visit  Medication Sig Dispense Refill   ALPRAZolam  (XANAX ) 0.25 MG tablet Take 1 tablet (0.25 mg total) by mouth 2 (two) times daily as needed for anxiety. 20 tablet 0   aspirin -acetaminophen -caffeine  (EXCEDRIN  MIGRAINE) 250-250-65 MG tablet Take 1 tablet by mouth every 6 (six) hours as needed for headache.     Blood Glucose Monitoring Suppl (ONETOUCH VERIO) w/Device KIT Use as advised 1 kit 1   cetirizine  (ZYRTEC) 10 MG tablet Take 10 mg by mouth daily.      clobetasol  (TEMOVATE ) 0.05 % external solution MIX IN JAR OF CERAVE CREAM, THEN APPLY TO BACK TWICE DAILY UNTIL ITCH IMPROVED - AVOID FACE, GROIN, AND AXILLA (Patient taking differently: as needed. MIX IN JAR OF CERAVE CREAM, THEN APPLY TO BACK TWICE DAILY UNTIL ITCH IMPROVED - AVOID FACE, GROIN, AND AXILLA) 50 mL 1   Continuous Glucose Sensor (DEXCOM G7 SENSOR) MISC 3 each by Does not apply route every 30 (thirty) days. Apply 1 sensor every 10 days 9 each 4   docusate sodium  (COLACE) 100 MG capsule Take 300 mg by mouth at bedtime.      DULoxetine  (CYMBALTA ) 60 MG capsule TAKE 1 CAPSULE BY MOUTH DAILY 90 capsule 3   fluticasone  (FLONASE ) 50 MCG/ACT nasal spray Place 1 spray into both nostrils daily.     folic acid (FOLVITE) 1 MG tablet Take 1 mg by mouth daily.     furosemide  (LASIX ) 20 MG tablet TAKE 1 TABLET BY MOUTH DAILY 90 tablet 0   gabapentin  (NEURONTIN ) 100 MG capsule Take 100mg  twice  a day and 2 at bedtime 360 capsule 3   Glucagon  3 MG/DOSE POWD Place 3 mg into the nose once as needed for up to 1 dose. 1 each 11   glucose blood (ONETOUCH ULTRA) test strip Use as instructed 2x a day 100 strip 11   HYDROcodone -acetaminophen  (NORCO/VICODIN) 5-325 MG tablet Take 1 tablet by mouth every 4 (four) hours as needed for moderate pain (pain score 4-6). 60 tablet 0   insulin  aspart (FIASP  FLEXTOUCH) 100 UNIT/ML FlexTouch Pen Use as instructed     Insulin  Aspart, w/Niacinamide , (FIASP ) 100 UNIT/ML SOLN Use up to 250 units daily for pump. 90 mL 11   insulin  glargine (LANTUS  SOLOSTAR) 100 UNIT/ML Solostar Pen Inject 25 Units into the skin at bedtime. 15 mL PRN   Insulin  Infusion Pump Supplies (ILET INFUSION-INSET 23 ) MISC CHANGE DAILY 450 each 3   Insulin  Pen Needle 32G X 4 MM MISC Use 1x a day 50 each 3   JARDIANCE  10 MG TABS tablet TAKE 1 TABLET BY MOUTH DAILY  BEFORE BREAKFAST 90 tablet 3   LUMIGAN 0.01 % SOLN Place 1 drop into both eyes at  bedtime.      methotrexate (RHEUMATREX) 2.5 MG tablet Take by mouth.     metoprolol  succinate (TOPROL -XL) 25 MG 24 hr tablet TAKE 1 AND 1/2 TABLETS BY MOUTH  DAILY 135 tablet 3   ondansetron  (ZOFRAN -ODT) 4 MG disintegrating tablet DISSOLVE 1 TABLET ON THE TONGUE  EVERY 6 HOURS AS NEEDED FOR  NAUSEA AND VOMITING 180 tablet 0   OneTouch Delica Lancets 33G MISC Use 2x a day 100 each 11   pantoprazole  (PROTONIX ) 40 MG tablet Take 1 tablet (40 mg total) by mouth 2 (two) times daily. 180 tablet 3   potassium chloride  SA (KLOR-CON  M) 20 MEQ tablet Take 1 tablet (20 mEq total) by mouth daily. 90 tablet 3   rosuvastatin  (CRESTOR ) 10 MG tablet TAKE 1 TABLET BY MOUTH DAILY 90 tablet 2   traZODone  (DESYREL ) 100 MG tablet TAKE 2 TABLETS BY MOUTH EVERY  NIGHT AT BEDTIME 180 tablet 3   triamterene -hydrochlorothiazide  (MAXZIDE -25) 37.5-25 MG tablet TAKE 1 TABLET BY MOUTH ONCE  DAILY 90 tablet 0   valACYclovir  (VALTREX ) 1000 MG tablet TAKE 2 TABLETS BY MOUTH AT ONSET OF SYMPTOMS AS NEEDED FOR FEVER  BLISTERS AND TAKE 2 TABLETS BY  MOUTH 12 HOURS LATER 90 tablet 3   Fluocinolone  Acetonide 0.01 % OIL Apply 1-2 gtts to ears once to twice daily as needed until improved. (Patient not taking: Reported on 09/17/2024) 20 mL 2   Insulin  Disposable Pump (OMNIPOD 5 DEXG7G6 INTRO GEN 5) KIT USE AS DIRECTED (Patient not taking: Reported on 09/17/2024) 1 kit 0   Insulin  Disposable Pump (OMNIPOD 5 DEXG7G6 PODS GEN 5) MISC USE 1 POD EVERY OTHER DAY (Patient not taking: Reported on 09/17/2024) 45 each 3   methocarbamol (ROBAXIN) 500 MG tablet Take 1 tablet every 8 hours by oral route as needed. (Patient not taking: Reported on 09/17/2024)     SKYRIZI PEN 150 MG/ML pen Inject into the skin. (Patient not taking: Reported on 09/17/2024)     Blood Glucose Monitoring Suppl (FIFTY50 GLUCOSE METER 2.0) w/Device KIT      No facility-administered medications prior to visit.     Review of Systems: as above    Physical Exam:  BP  120/80   Pulse (!) 105   Temp 97.6 F (36.4 C) (Temporal)   Ht 5' 4 (1.626 m)   Wt  174 lb (78.9 kg)   SpO2 98%   BMI 29.87 kg/m   GEN: Pleasant, interactive, well-kempt; in no acute distress HEENT:  Normocephalic and atraumatic. PERRLA. Sclera white. Nasal turbinates erythematous, moist and patent bilaterally. Cream rhinorrhea present. Oropharynx pink and moist, without exudate or edema. No lesions, ulcerations. Sinus tenderness NECK:  Supple w/ fair ROM. Cervical lymphadenopathy.   CV: RRR, no m/r/g, no peripheral edema. Pulses intact, +2 bilaterally. No cyanosis, pallor or clubbing. PULMONARY:  Unlabored, regular breathing. Clear bilaterally A&P w/o wheezes/rales/rhonchi. No accessory muscle use.  GI: BS present and normoactive. Soft, non-tender to palpation.  MSK: No erythema, warmth or tenderness.  Neuro: A/Ox3. No focal deficits noted.   Skin: Warm, no lesions or rashe Psych: Normal affect and behavior. Judgement and thought content appropriate.     Lab Results:  CBC    Component Value Date/Time   WBC 7.7 05/21/2024 1310   RBC 4.46 05/21/2024 1310   HGB 12.4 05/21/2024 1310   HGB 13.2 12/14/2016 1129   HCT 38.7 05/21/2024 1310   HCT 39.3 12/14/2016 1129   PLT 259.0 05/21/2024 1310   PLT 289 12/14/2016 1129   MCV 86.7 05/21/2024 1310   MCV 85 12/14/2016 1129   MCV 86 03/07/2014 1107   MCH 27.4 10/25/2023 1701   MCHC 32.0 05/21/2024 1310   RDW 18.2 (H) 05/21/2024 1310   RDW 14.0 12/14/2016 1129   RDW 13.0 03/07/2014 1107   LYMPHSABS 2.5 09/15/2021 1759   LYMPHSABS 2.6 12/14/2016 1129   MONOABS 1.1 (H) 09/15/2021 1759   EOSABS 0.2 09/15/2021 1759   EOSABS 0.3 12/14/2016 1129   BASOSABS 0.0 09/15/2021 1759   BASOSABS 0.0 12/14/2016 1129    BMET    Component Value Date/Time   NA 141 05/21/2024 1310   NA 144 03/17/2024 1436   NA 138 03/07/2014 1107   K 4.1 05/21/2024 1310   K 3.5 03/07/2014 1107   CL 104 05/21/2024 1310   CL 103 03/07/2014 1107   CO2 30  05/21/2024 1310   CO2 26 03/07/2014 1107   GLUCOSE 114 (H) 05/21/2024 1310   GLUCOSE 150 (H) 03/07/2014 1107   BUN 15 05/21/2024 1310   BUN 18 03/17/2024 1436   BUN 12 03/07/2014 1107   CREATININE 0.81 05/21/2024 1310   CREATININE 1.08 (H) 03/03/2021 1540   CALCIUM  9.4 05/21/2024 1310   CALCIUM  10.6 (H) 03/07/2014 1107   GFRNONAA >60 10/25/2023 1701   GFRNONAA 55 (L) 03/03/2021 1540   GFRAA 63 03/03/2021 1540    BNP No results found for: BNP   Imaging:  LONG TERM MONITOR (3-14 DAYS) Result Date: 09/14/2024   The patient was monitored for 8 days, 3 hours; 6 days, 22 hours were suitable for analysis.   The predominant rhythm was sinus with an average rate of 91 bpm (range 66-140 bpm).   Rare PACs and PVCs were observed.   No sustained arrhythmia or prolonged pause occurred.   Patient triggered events correspond to sinus rhythm, PACs, and PVCs. Predominantly sinus rhythm with rare PACs and PVCs.  No significant arrhythmia was identified.    Administration History     None           No data to display          Lab Results  Component Value Date   NITRICOXIDE 5 09/17/2024        Assessment & Plan:   OSA (obstructive sleep apnea) Severe OSA, on CPAP. Inconsistent use over the  last 1-2 weeks due to illness/sinus symptoms. Reviewed risks of untreated severe OSA and encouraged to resume therapy, once sinus symptoms improved/resolved. Receives benefit from use. Aware of proper care/use of device. Safe driving practices reviewed. Healthy weight management encouraged.  Patient Instructions  Trial Albuterol  inhaler 2 puffs every 6 hours as needed for shortness of breath or wheezing Continue flonase  nasal spray 2 sprays each nostril daily Continue over the counter cold and flu medication  Continue cetirizine daily  Cefdinir 1 tab Twice daily  for 7 days for sinus infection. Take with food Azelastine  nasal spray 2 sprays each nostril Twice daily for nasal  congestion/drainage  We will set you up for some lung function testing  Let your heart doctor know about your elevated heart rate. It could be that it is just elevated because you are sick. Push plenty of fluids to ensure you are staying hydrated.   Continue to use CPAP every night, minimum of 4-6 hours a night.  Change equipment as directed. Wash your tubing with warm soap and water daily, hang to dry. Wash humidifier portion weekly. Use bottled, distilled water and change daily Be aware of reduced alertness and do not drive or operate heavy machinery if experiencing this or drowsiness.  Exercise encouraged, as tolerated. Healthy weight management discussed.  Avoid or decrease alcohol consumption and medications that make you more sleepy, if possible. Notify if persistent daytime sleepiness occurs even with consistent use of PAP therapy.  Change CPAP supplies... Every month Mask cushions and/or nasal pillows CPAP machine filters Every 3 months Mask frame (not including the headgear) CPAP tubing Every 6 months Mask headgear Chin strap (if applicable) Humidifier water tub  Orders placed for CPAP supplies  Follow up in 4-6 weeks after PFT with any new pulmonologist (30 min slot) to establish care. If symptoms do not improve or worsen, please contact office for sooner follow up or seek emergency care.    Acute sinusitis Suspect bacterial coinfection given worsening of symptoms and failure to improve with supportive care measures after 7-10 days. Will tx her with empiric abx cefdinir and add on azelastine  nasal spray. Continue supportive care. Follow up with ENT if symptoms fail to improve. Appears to be driving factor of her cough. Advised on measures to minimize upper airway irritation. Exhaled nitric oxide testing is normal, indicating lack of type II inflammation so lower suspicion symptoms are asthma driven as lung exam is also benign. Check allergen panel today to assess for  allergic phenotype  DOE (dyspnea on exertion) Chronic DOE. No acute worsening. Hx of asthma. See above. Never smoker. She does have psoriatic arthritis. No evidence of CT ILD on prior imaging from earlier this year. Will trial her on albuterol  and reassess response. Obtain PFT at follow up. Consider cardiopulmonary exercise test, given associated tachycardia, if PFTs unremarkable and no perceived benefit from SABA.   Tachycardia HR <110 bpm today in office. Per her report, she had elevated readings up to 120's last night. Possibly due to acute illness. Advised to push fluids and remain hydrated. Wells criteria low suspicion for PE. Advised to notify cardiology if increased HR persists. ED precautions reviewed.   Asthma See above. Does not seem asthma driven. No significant peripheral eosinophilia.    Advised if symptoms do not improve or worsen, to please contact office for sooner follow up or seek emergency care.   I spent 45 minutes of dedicated to the care of this patient on the date of this encounter to  include pre-visit review of records, face-to-face time with the patient discussing conditions above, post visit ordering of testing, clinical documentation with the electronic health record, making appropriate referrals as documented, and communicating necessary findings to members of the patients care team.  Comer LULLA Rouleau, NP 09/17/2024  Pt aware and understands NP's role.

## 2024-09-17 NOTE — Assessment & Plan Note (Addendum)
 Suspect bacterial coinfection given worsening of symptoms and failure to improve with supportive care measures after 7-10 days. Will tx Shannon [name] with empiric abx cefdinir and add on azelastine  nasal spray. Continue supportive care. Follow up with ENT if symptoms fail to improve. Appears to be driving factor of Shannon [name] cough. Advised on measures to minimize upper airway irritation. Exhaled nitric oxide testing is normal, indicating lack of type II inflammation so lower suspicion symptoms are asthma driven as lung exam is also benign. Check allergen panel today to assess for allergic phenotype

## 2024-09-17 NOTE — Assessment & Plan Note (Signed)
 Severe OSA, on CPAP. Inconsistent use over the last 1-2 weeks due to illness/sinus symptoms. Reviewed risks of untreated severe OSA and encouraged to resume therapy, once sinus symptoms improved/resolved. Receives benefit from use. Aware of proper care/use of device. Safe driving practices reviewed. Healthy weight management encouraged.  Patient Instructions  Trial Albuterol  inhaler 2 puffs every 6 hours as needed for shortness of breath or wheezing Continue flonase  nasal spray 2 sprays each nostril daily Continue over the counter cold and flu medication  Continue cetirizine daily  Cefdinir 1 tab Twice daily  for 7 days for sinus infection. Take with food Azelastine  nasal spray 2 sprays each nostril Twice daily for nasal congestion/drainage  We will set you up for some lung function testing  Let your heart doctor know about your elevated heart rate. It could be that it is just elevated because you are sick. Push plenty of fluids to ensure you are staying hydrated.   Continue to use CPAP every night, minimum of 4-6 hours a night.  Change equipment as directed. Wash your tubing with warm soap and water daily, hang to dry. Wash humidifier portion weekly. Use bottled, distilled water and change daily Be aware of reduced alertness and do not drive or operate heavy machinery if experiencing this or drowsiness.  Exercise encouraged, as tolerated. Healthy weight management discussed.  Avoid or decrease alcohol consumption and medications that make you more sleepy, if possible. Notify if persistent daytime sleepiness occurs even with consistent use of PAP therapy.  Change CPAP supplies... Every month Mask cushions and/or nasal pillows CPAP machine filters Every 3 months Mask frame (not including the headgear) CPAP tubing Every 6 months Mask headgear Chin strap (if applicable) Humidifier water tub  Orders placed for CPAP supplies  Follow up in 4-6 weeks after PFT with any new pulmonologist  (30 min slot) to establish care. If symptoms do not improve or worsen, please contact office for sooner follow up or seek emergency care.

## 2024-09-18 LAB — ALLERGEN PANEL (27) + IGE
Alternaria Alternata IgE: <0.1 kU/L
Aspergillus Fumigatus IgE: <0.1 kU/L
Bahia Grass IgE: <0.1 kU/L
Bermuda Grass IgE: <0.1 kU/L
Cat Dander IgE: <0.1 kU/L
Cedar, Mountain IgE: <0.1 kU/L
Cladosporium Herbarum IgE: <0.1 kU/L
Cocklebur IgE: <0.1 kU/L
Cockroach, American IgE: <0.1 kU/L
Common Silver Birch IgE: <0.1 kU/L
D Farinae IgE: <0.1 kU/L
D Pteronyssinus IgE: <0.1 kU/L
Dog Dander IgE: <0.1 kU/L
Elm, American IgE: <0.1 kU/L
Hickory, White IgE: <0.1 kU/L
IgE (Immunoglobulin E), Serum: 3 [IU]/mL — ABNORMAL LOW (ref 6–495)
Johnson Grass IgE: <0.1 kU/L
Kentucky Bluegrass IgE: <0.1 kU/L
Maple/Box Elder IgE: <0.1 kU/L
Mucor Racemosus IgE: <0.1 kU/L
Oak, White IgE: <0.1 kU/L
Penicillium Chrysogen IgE: <0.1 kU/L
Pigweed, Rough IgE: <0.1 kU/L
Plantain, English IgE: <0.1 kU/L
Ragweed, Short IgE: <0.1 kU/L
Setomelanomma Rostrat: <0.1 kU/L
Timothy Grass IgE: <0.1 kU/L
White Mulberry IgE: <0.1 kU/L

## 2024-09-19 ENCOUNTER — Ambulatory Visit: Payer: Self-pay | Admitting: Nurse Practitioner

## 2024-09-19 NOTE — Progress Notes (Signed)
 Allergen panel negative. Thanks.

## 2024-09-23 ENCOUNTER — Encounter: Payer: Self-pay | Admitting: Cardiology

## 2024-09-23 ENCOUNTER — Telehealth: Payer: Self-pay

## 2024-09-23 ENCOUNTER — Ambulatory Visit: Attending: Cardiology | Admitting: Cardiology

## 2024-09-23 VITALS — BP 110/68 | HR 88 | Ht 64.0 in | Wt 175.4 lb

## 2024-09-23 DIAGNOSIS — I700 Atherosclerosis of aorta: Secondary | ICD-10-CM

## 2024-09-23 DIAGNOSIS — I340 Nonrheumatic mitral (valve) insufficiency: Secondary | ICD-10-CM | POA: Diagnosis not present

## 2024-09-23 DIAGNOSIS — I2510 Atherosclerotic heart disease of native coronary artery without angina pectoris: Secondary | ICD-10-CM | POA: Diagnosis not present

## 2024-09-23 DIAGNOSIS — E1169 Type 2 diabetes mellitus with other specified complication: Secondary | ICD-10-CM

## 2024-09-23 DIAGNOSIS — R002 Palpitations: Secondary | ICD-10-CM

## 2024-09-23 DIAGNOSIS — I10 Essential (primary) hypertension: Secondary | ICD-10-CM

## 2024-09-23 DIAGNOSIS — E785 Hyperlipidemia, unspecified: Secondary | ICD-10-CM

## 2024-09-23 DIAGNOSIS — R5383 Other fatigue: Secondary | ICD-10-CM

## 2024-09-23 MED ORDER — FUROSEMIDE 20 MG PO TABS: 20.0000 mg | ORAL_TABLET | Freq: Every day | ORAL | 0 refills | Status: DC

## 2024-09-23 NOTE — Telephone Encounter (Signed)
 Rx sent electronically.

## 2024-09-23 NOTE — Patient Instructions (Signed)
 Medication Instructions:  Your physician recommends that you continue on your current medications as directed. Please refer to the Current Medication list given to you today.   *If you need a refill on your cardiac medications before your next appointment, please call your pharmacy*  Lab Work: No labs ordered today  If you have labs (blood work) drawn today and your tests are completely normal, you will receive your results only by: MyChart Message (if you have MyChart) OR A paper copy in the mail If you have any lab test that is abnormal or we need to change your treatment, we will call you to review the results.  Testing/Procedures: No test ordered today   Follow-Up: At [facility], you and your health needs are our priority.  As part of our continuing mission to provide you with exceptional heart care, our providers are all part of one team.  This team includes your primary Cardiologist (physician) and Advanced Practice Providers or APPs (Physician Assistants and Nurse Practitioners) who all work together to provide you with the care you need, when you need it.  Your next appointment:   6 month(s)  Provider:   You may see Lonni Hanson, MD or one of the following Advanced Practice Providers on your designated Care Team:   Tylene Lunch, NP

## 2024-09-23 NOTE — Progress Notes (Signed)
 Cardiology Office Note   Date:  09/23/2024  ID:  Shannon [name], DOB 1956-10-08, MRN [id number] PCP: Jimmy Charlie FERNS, MD  [facility] Providers Cardiologist:  Lonni Hanson, MD     History of Present Illness Shannon [name] is a 68 y.o. female with past medical history of nonobstructive coronary artery disease by coronary CTA, mild mitral regurgitation, aortic atherosclerosis, hypertension, hyperlipidemia, type 2 diabetes, gastroesophageal reflux disease, who presents today for follow-up.   She was previously followed by Dr. Monette with evaluation in 2018 for nonexertional chest pain.  Echo at that time showed an EF of 55 to 60%, no regional wall motion abnormalities, mild LVH, grade 1 diastolic dysfunction, mitral valve with systolic bowing without prolapse, trivial mitral regurgitation, and normal RV systolic function.  Treadmill MPI in 11/2016 showed no significant ischemia with an EF of 66% and was overall low risk.  Due to persisting symptoms she underwent LHC in 11/2016 that showed minimal luminal irregularities affecting the LAD with no evidence of obstructive CAD and normal LV systolic function with mildly elevated LVEDP.  Echo from 03/2021 showed an EF of 50 to 55%, no regional wall motion abnormalities, grade 1 diastolic dysfunction, normal RV systolic function and ventricular cavity size, normal RVSP, mild mitral regurgitation, and an estimated right atrial pressure of 8 mmHg.  She was last seen in the office in 02/2024 noting a several month history of exertional fatigue, chest discomfort, and dyspnea.  She reported her hands and feet would become swollen if she forgot to take her furosemide .  Coronary CTA in 03/2024 showed a calcium  score of 156 which was the 85th percentile.  There was 25 to 49% proximal LAD stenosis.  Echo in 04/2024 showed an EF of 60 to 65%, no regional wall motion abnormalities, grade 1 diastolic dysfunction, normal RV systolic function and ventricular cavity  size, mild mitral regurgitation, and an estimated right atrial pressure of 3 mmHg.   She had previously been seen in clinic 05/20/2024 without symptoms of angina or cardiac decompensation.  She had woken around 4 AM with a headache and typically 3 to 4 days headaches per week.  Generally she did not feel well today.  She continued to have swelling in her hands and feet but did not take furosemide .  Longstanding palpitations that were largely stable.  There were no near-syncope or syncope episodes.  She continued to report generalized malaise and fatigue.  It was not suspected that her issues were primary cardiac etiology and she was not aggressively diuresed.  She presented to clinic 08/20/2024 with complaints of palpitations after being evaluated by rheumatologist.  He saw that he was worried about atrial fibrillation and that she should see a cardiologist.  She showed up at the office to be seen as walk-in unfortunately had to be scheduled for an additional day.   She was last seen in clinic 08/21/2024 with complaints of palpitations that been ongoing for approximately 3 weeks.  She had fatigue and weakness.  She had 1 episode of left arm and neck pain that she was unsure if related to her accident but there was no associated aggravating or alleviating factors.  She was placed on a ZIO XT monitor to rule out any arrhythmias or pauses.  She returns to clinic today stating that overall she has been doing well with cardiac perspective.  States that her palpitations have resolved.  She has recently followed up with pulmonary and has PFTs that have been ordered.  She  has not had any reoccurrence of left arm and neck pain.  She had just recently changed her insulin  therapy when she was concerned about her symptoms as they could be related to side effects.  She states she has been compliant with her current medication regimen without any undue side effects.  Denies any hospitalizations or visits to the emergency  department.  ROS: 10 point review of systems has been reviewed and considered negative except ones been listed in the HPI  Studies Reviewed     Event Monitor (Zio) 09/11/2024   The patient was monitored for 8 days, 3 hours; 6 days, 22 hours were suitable for analysis.   The predominant rhythm was sinus with an average rate of 91 bpm (range 66-140 bpm).   Rare PACs and PVCs were observed.   No sustained arrhythmia or prolonged pause occurred.   Patient triggered events correspond to sinus rhythm, PACs, and PVCs.   Predominantly sinus rhythm with rare PACs and PVCs.  No significant arrhythmia was identified.   2D echo 04/24/2024: 1. Left ventricular ejection fraction, by estimation, is 60 to 65%. The  left ventricle has normal function. The left ventricle has no regional  wall motion abnormalities. Left ventricular diastolic parameters are  consistent with Grade I diastolic  dysfunction (impaired relaxation). The average left ventricular global  longitudinal strain is -19.3 %. The global longitudinal strain is normal.   2. Right ventricular systolic function is normal. The right ventricular  size is normal.   3. The mitral valve is normal in structure. Mild mitral valve  regurgitation.   4. The aortic valve is tricuspid. Aortic valve regurgitation is not  visualized.   5. The inferior vena cava is normal in size with greater than 50%  respiratory variability, suggesting right atrial pressure of 3 mmHg.    Coronary CTA 04/10/2024: FINDINGS: Aorta:  Normal size.  No calcifications.  No dissection.   Aortic Valve:  Trileaflet.  No calcifications.   Coronary Arteries:  Normal coronary origin.  Right dominance.   RCA is a dominant artery. There is no plaque.   Left main gives rise to LAD and LCX arteries. LM has no disease.   LAD has calcified plaque proximally causing mild stenosis (25-49%).   LCX is a non-dominant artery.  There is no plaque.   Other findings:   Normal  pulmonary vein drainage into the left atrium.   Normal left atrial appendage without a thrombus.   Normal size of the pulmonary artery.   IMPRESSION: 1. Coronary calcium  score of 156. This was 85th percentile for age and sex matched control.   2. Normal coronary origin with right dominance.   3. Mild proximal LAD stenosis (25-49%).   4. CAD-RADS 2. Mild non-obstructive CAD (25-49%). Consider preventive therapy and risk factor modification.   2D echo 03/22/2021: 1. Left ventricular ejection fraction, by estimation, is 50 to 55%. The  left ventricle has low normal function. The left ventricle has no regional  wall motion abnormalities. Left ventricular diastolic parameters are  consistent with Grade I diastolic  dysfunction (impaired relaxation).   2. Right ventricular systolic function is normal. The right ventricular  size is normal. There is normal pulmonary artery systolic pressure.   3. The mitral valve is normal in structure. Mild mitral valve  regurgitation.   4. The aortic valve is tricuspid. Aortic valve regurgitation is not  visualized. No aortic stenosis is present.   5. The inferior vena cava is normal in size with <  50% respiratory  variability, suggesting right atrial pressure of 8 mmHg.   Comparison(s): EF 55-60%, mild thickening of MV leaflets, no MVP seen only  bowing.    LHC 12/18/2016: The left ventricular systolic function is normal. LV end diastolic pressure is mildly elevated. The left ventricular ejection fraction is 55-65% by visual estimate.   1. Minimal luminal irregularities affecting the LAD. No evidence of obstructive coronary artery disease. 2. Normal LV systolic function. Mildly elevated left ventricular end-diastolic pressure.   Recommendations: The chest pain seems to be noncardiac.   Treadmill MPI 12/05/2016: Exercise myocardial perfusion imaging study with no significant ischemia Normal wall motion, EF estimated at 66% No EKG changes  concerning for ischemia at peak stress or in recovery. Target heart rate achieved Low risk scan   2D echo 11/29/2016: - Left ventricle: The cavity size was normal. Wall thickness was    increased in a pattern of mild LVH. Systolic function was normal.    The estimated ejection fraction was in the range of 55% to 60%.    Wall motion was normal; there were no regional wall motion    abnormalities. Doppler parameters are consistent with abnormal    left ventricular relaxation (grade 1 diastolic dysfunction).  - Mitral valve: Systolic bowing without prolapse. There was trivial    regurgitation.  - Right ventricle: The cavity size was normal. Wall thickness was    normal. Systolic function was normal.    2D echo 02/06/2013: - Left ventricle: The cavity size was normal. Wall thickness    was normal. Systolic function was normal. The estimated    ejection fraction was in the range of 50% to 55%. Wall    motion was normal; there were no regional wall motion    abnormalities. Left ventricular diastolic function    parameters were normal.  - Mitral valve: Mild to moderate regurgitation.  - Right ventricle: Systolic function was normal.  - Tricuspid valve: Mild-moderate regurgitation.  - Pulmonary arteries: Systolic pressure is mildly elevated    consistent with mild pulmonary HTN. PA peak pressure: 35mm    Hg (S).  Risk Assessment/Calculations           Physical Exam VS:  BP 110/68 (BP Location: Left Arm, Patient Position: Sitting, Cuff Size: Normal)   Pulse 88   Ht 5' 4 (1.626 m)   Wt 175 lb 6.4 oz (79.6 kg)   SpO2 99%   BMI 30.11 kg/m        Wt Readings from Last 3 Encounters:  09/23/24 175 lb 6.4 oz (79.6 kg)  09/17/24 174 lb (78.9 kg)  08/21/24 178 lb 12.8 oz (81.1 kg)    GEN: Well nourished, well developed in no acute distress NECK: No JVD; No carotid bruits CARDIAC: RRR, no murmurs, rubs, gallops RESPIRATORY:  Clear to auscultation without rales, wheezing or rhonchi   ABDOMEN: Soft, non-tender, non-distended EXTREMITIES:  No edema; No deformity   ASSESSMENT AND PLAN Nonobstructive coronary artery disease without symptoms suggestive of angina or anginal equivalents.  Coronary CTA in 03/2024 showed nonobstructive coronary artery disease with 25 to 49% in the LAD.  Recommend continue with aggressive risk factor modification and primary prevention including aspirin  81 mg daily and rosuvastatin  10 mg daily.  No further ischemic evaluation needed at this time.  Mild mitral regurgitation noted on prior echocardiogram.  Most recent echocardiogram in 04/2024 showed mild mitral regurgitation.  Will monitor with surveillance echocardiograms as needed.  Primary hypertension with a blood pressure today 110/68.  Blood pressure continues to remain well-controlled.  She is continued on her current medication regimen of triamterene  HCTZ 37.5/25 Toprol -XL 37.5 and furosemide  20 mg daily.  She has been encouraged to continue to monitor pressures 1 to 2 hours postmedication administration as well.  Aortic atherosclerosis/hyperlipidemia with an LDL of 67 in April 2025 with normal AST/ALT at that time.  She has been continued on rosuvastatin  10 mg daily.  Palpitations that been longstanding and nonprogressive.  She is currently on Toprol -XL 37.5 mg daily was placed on ZIO XT monitor that was overall unrevealing without any atrial fibrillation or atrial flutter.  Average heart rate was 91 bpm, rare early beats were noted.  No sustained arrhythmias or pauses were noted.  Overall it was a benign monitor.  Generalized malaise and fatigue that does not appear to be primary cardiac in etiology.  She has been encouraged to continue to maintain follow-up with her PCP.  Type 2 diabetes with multiple diabetic medications and recent changes ongoing management by her PCP.  Headache disorder that is stable and unchanged.       Dispo: Patient to return to clinic to see MD/APP in 6 months or  sooner if needed for further evaluation.  Signed, Vonya Ohalloran, NP

## 2024-09-26 ENCOUNTER — Ambulatory Visit: Admitting: Clinical

## 2024-09-26 DIAGNOSIS — F331 Major depressive disorder, recurrent, moderate: Secondary | ICD-10-CM

## 2024-09-26 NOTE — Progress Notes (Signed)
   [name], LCSW

## 2024-09-26 NOTE — Progress Notes (Signed)
 [facility] Counselor/Therapist Progress Note  Patient ID: Shannon [name], MRN: [id number],    Date: 09/26/2024  Time Spent: 10:33am - 11:32am : 59 minutes   Treatment Type: Individual Therapy  Reported Symptoms: Patient reported improvement in mood  Mental Status Exam: Appearance:  Neat and Well Groomed     Behavior: Appropriate  Motor: Normal  Speech/Language:  Clear and Coherent and Normal Rate  Affect: Appropriate  Mood: normal  Thought process: normal  Thought content:   WNL  Sensory/Perceptual disturbances:   WNL  Orientation: oriented to person, place, time/date, and situation  Attention: Good  Concentration: Good  Memory: WNL  Fund of knowledge:  Good  Insight:   Good  Judgment:  Good  Impulse Control: Good   Risk Assessment: Danger to Self:  No Patient denied current suicidal ideation  Self-injurious Behavior: No Danger to Others: No Patient denied current homicidal ideation Duty to Warn:no Physical Aggression / Violence:No  Access to Firearms a concern: No  Gang Involvement:No   Subjective: Patient stated, actually pretty good and stated, Shannon [name] (husband) has been more like himself in response to events/changes since last session. Patient stated, that's made a world of difference for me in reference to changes in husband's response. Patient stated, I feel like my state of mind is better, I'm feeling more and more like me. Patient stated, the grief has kind of lifted some, I feel like I've accepted they're (family members) gone for now and not forever. Patient reported patient's mother previously told patient, you'll be ok and patient stated, I've realized she knew my strength when I didn't. Patient stated, I feel like instead of concentrating on the negative in the relationship and what I haven't got, and tried to be more thankful for what he (husband) does put into the relationship in his way. Patient reported patient has asked  husband to refrain from speaking to patient negatively and has established boundaries with husband in reference to the way husband responds to patient.  Patient stated,  trying my best to be positive. Patient reported patient's faith is a source of strength. Patient stated, I feel like things have turned around in a big way for me, I'm not staying depressed all the time, even to the point of, do I want to see a psychiatrist anymore.  Patient reported patient has not been taking naps during the day, a decrease in loss of interest and stated, I've started to notice I've got less anger. Patient reported patient has been reflecting on conversations with loved ones that are now deceased and reported recent reflections have brought patient comfort. Patient stated, I'm trying to have a heart of gratitude.  Interventions: Cognitive Behavioral Therapy. Clinician conducted session via caregility video from clinician's home office. Patient provided verbal consent to proceed with telehealth session and is aware of limitations of telephone or video visits. Patient participated in session from patient's home. Reviewed events since last session and assessed for changes. Reviewed positive changes and changes in patient's thoughts since last session. Discussed stages of grief and current stage of grief as it relates to patient. Reviewed boundaries patient has established in patient's relationship. Discussed upcoming appointment with psychiatrist and patient's ambivalence regarding appointment. Assisted patient in exploring pros/cons of attending appointment. Provided psycho education related to triggers, grief, and symptoms of depression. Discussed changes in symptoms. Clinician requested for homework patient continue gratitude journal and thought record.   Collaboration of Care: not required at this time   Diagnosis:  Major  depressive disorder, recurrent episode, moderate (HCC)     Plan: Patient is to utilize  [employer] [facility], thought re-framing, mindfulness and coping strategies to decrease symptoms associated with their diagnosis. Frequency: bi-weekly  Modality: individual      Long-term goal:   Reduce overall level, frequency, and intensity of the feelings of depression as evidenced by decrease in grief, loss of interest, depressed mood, isolation, increased sleep, low energy, psychomotor retardation, fatigue, feelings of guilt, lack of concentration, loss of motivation from 7 days/week to 0 to 1 days/week per patient report for at least 3 consecutive months. Target Date: 05/21/25  Progress: progressing    Short-term goal:  Begin healthy grieving process as it relates to multiple losses patient has experienced (mother, father, sister, mother in law) Target Date: 05/21/25  Progress: progressing    Develop and implement effective communication strategies for patient to utilize when expressing her thoughts and feelings to others in a controlled and assertive way  Target Date: 05/21/25  Progress: progressing    Increase patient's participation in positive activities from 0 days per week to 3 per day week Target Date: 05/21/25  Progress: progressing    Develop and implement strategies to increase feelings of closeness in patient's relationship with husband Target Date: 05/21/25  Progress: progressing    Increase communication/contact with patient's children Target Date: 05/21/25  Progress: progressing             [name], LCSW

## 2024-09-28 NOTE — Progress Notes (Unsigned)
 Psychiatric Initial Adult Assessment   Patient Identification: Shannon [name] MRN:  [id number] Date of Evaluation:  09/28/2024 Referral Source: *** Chief Complaint:  No chief complaint on file.  Visit Diagnosis: No diagnosis found.  History of Present Illness:   Shannon [name] is a 68 y.o. year old female with a history of depression, type II diabetes, hypertension, hyperlipidemia,  OSA, GERD, who is referred for depression.   Duloxetine  60 mg daily,  Xanax , gabapentin  100 mg twice a day      Associated Signs/Symptoms: Depression Symptoms:  {DEPRESSION SYMPTOMS:20000} (Hypo) Manic Symptoms:  {BHH MANIC SYMPTOMS:22872} Anxiety Symptoms:  {BHH ANXIETY SYMPTOMS:22873} Psychotic Symptoms:  {BHH PSYCHOTIC SYMPTOMS:22874} PTSD Symptoms: {BHH PTSD SYMPTOMS:22875}  Past Psychiatric History:  Outpatient:  Psychiatry admission:  Previous suicide attempt:  Past trials of medication:  History of violence:  History of head injury:   Previous Psychotropic Medications: {YES/NO:21197}  Substance Abuse History in the last 12 months:  {yes no:314532}  Consequences of Substance Abuse: {BHH CONSEQUENCES OF SUBSTANCE ABUSE:22880}  Past Medical History:  Past Medical History:  Diagnosis Date   Actinic keratosis    Allergy    Arthritis    hip, back, shoulders, joints   Asthma    Complication of anesthesia    problems with block during breast augmentation and epidural during childbirth   Depression    Diabetes mellitus 09/2011   type 2   Family history of adverse reaction to anesthesia    PROBLEMS WITH BLOCKS   Fatty liver    Fibromyalgia    GERD (gastroesophageal reflux disease)    Glaucoma suspect    Headache    migraines-4x a week headache-1x week migraine   History of hiatal hernia    Hyperlipidemia    Hypertension    Controlled on meds   Mitral valve prolapse    Nose colonized with MRSA 05/15/2018   Osteopenia    Pneumonia    PONV (postoperative nausea and  vomiting)    Psoriasis    Shingles 11/2006   Sleep disturbance    Tachycardia    Vertigo    Hx of    Past Surgical History:  Procedure Laterality Date   ABDOMINAL HYSTERECTOMY  2000   /BSO - abn. Paps   APPENDECTOMY  1978   AUGMENTATION MAMMAPLASTY Bilateral 1981   BLADDER REPAIR     x2   BREAST ENHANCEMENT SURGERY  1987   CARDIAC CATHETERIZATION     [facility]   CARDIAC CATHETERIZATION Left 12/18/2016   Procedure: Left Heart Cath and Coronary Angiography;  Surgeon: Deatrice DELENA Cage, MD;  Location: [facility] INVASIVE CV LAB;  Service: Cardiovascular;  Laterality: Left;   CARPAL TUNNEL RELEASE Bilateral    CERVICAL DISCECTOMY  11/2009   with synthetic discs inserted--  Dr Mavis   COLONOSCOPY     JOINT REPLACEMENT     Left Hip Replacement Left 02/01/2023   NM MYOVIEW  LTD  03/2005   EF 56%, no ischemia   RECTAL PROLAPSE REPAIR     x1   SHOULDER ARTHROSCOPY WITH OPEN ROTATOR CUFF REPAIR Left 12/29/2019   Procedure: LEFT SHOULDER ARTHROSCOPIC DISTAL CLAVICLE EXCISISON, SUBACROMIAL DECOMPRESSION,BICEPS TENOTOMY,ROTATOR CUFF REPAIR ;  Surgeon: Leora Lynwood SAUNDERS, MD;  Location: [facility] ORS;  Service: Orthopedics;  Laterality: Left;   SHOULDER CLOSED REDUCTION Left 05/03/2020   Procedure: Left shoulder arthroscopic lysis of adhesions and manipulation under anesthesia;  Surgeon: Leora Lynwood SAUNDERS, MD;  Location: [facility] ORS;  Service: Orthopedics;  Laterality: Left;   SHOULDER SURGERY  09/2008, 01/2009   Right shoulder surgery, then redone with lysis of adhesions   TOTAL HIP ARTHROPLASTY Right 05/29/2018   Procedure: TOTAL HIP ARTHROPLASTY ANTERIOR APPROACH;  Surgeon: Leora Lynwood SAUNDERS, MD;  Location: [facility] ORS;  Service: Orthopedics;  Laterality: Right;   TOTAL HIP ARTHROPLASTY Left 01/2023   UPPER GI ENDOSCOPY      Family Psychiatric History: ***  Family History:  Family History  Problem Relation Age of Onset   Diabetes Mother    Heart disease Mother    Hypertension Mother    Ovarian cancer Mother     Melanoma Mother    Cancer Father    Coronary artery disease Father    Heart disease Father    Diabetes Sister    Depression Sister    Coronary artery disease Sister    Heart disease Sister    Pulmonary Hypertension Sister    Rheum arthritis Maternal Aunt    Heart attack Paternal Uncle    Heart attack Paternal Uncle    Breast cancer Maternal Grandmother    Diabetes Maternal Grandmother    Diabetes Maternal Grandfather    Heart attack Cousin    Colon cancer Neg Hx     Social History:   Social History   Socioeconomic History   Marital status: Married    Spouse name: Not on file   Number of children: 5   Years of education: Not on file   Highest education level: Not on file  Occupational History   Occupation: homemaker  Tobacco Use   Smoking status: Never    Passive exposure: Past   Smokeless tobacco: Never  Vaping Use   Vaping status: Never Used  Substance and Sexual Activity   Alcohol use: No   Drug use: No   Sexual activity: Not on file  Other Topics Concern   Not on file  Social History Narrative   Home maker   Social Drivers of Health   Financial Resource Strain: Not on file  Food Insecurity: Not on file  Transportation Needs: Not on file  Physical Activity: Not on file  Stress: Not on file  Social Connections: Not on file    Additional Social History: ***  Allergies:   Allergies  Allergen Reactions   Humalog [Insulin  Lispro] Swelling   Lisinopril Cough   Metformin  Other (See Comments)   Nitrofurantoin Other (See Comments)    Pt unsure   Oxycodone  Itching   Oxycodone -Acetaminophen  Itching   Oxycodone -Acetaminophen  Itching and Nausea Only   Atorvastatin  Diarrhea    May have caused colitis--but not clear cut   Clarithromycin Nausea Only   Glipizide  Other (See Comments)    Sugars too variable   Invokana  [Canagliflozin ]     Nausea, constipation    Metabolic Disorder Labs: Lab Results  Component Value Date   HGBA1C 7.2 (A) 07/14/2024    No results found for: PROLACTIN Lab Results  Component Value Date   CHOL 157 03/11/2024   TRIG 142.0 03/11/2024   HDL 61.30 03/11/2024   CHOLHDL 3 03/11/2024   VLDL 28.4 03/11/2024   LDLCALC 67 03/11/2024   LDLCALC 78 08/08/2023   Lab Results  Component Value Date   TSH 1.03 05/21/2024    Therapeutic Level Labs: No results found for: LITHIUM No results found for: CBMZ No results found for: VALPROATE  Current Medications: Current Outpatient Medications  Medication Sig Dispense Refill   albuterol  (VENTOLIN  HFA) 108 (90 Base) MCG/ACT inhaler Inhale 2 puffs into the lungs every 6 (six) hours  as needed for wheezing or shortness of breath. 8 g 2   ALPRAZolam  (XANAX ) 0.25 MG tablet Take 1 tablet (0.25 mg total) by mouth 2 (two) times daily as needed for anxiety. 20 tablet 0   aspirin -acetaminophen -caffeine  (EXCEDRIN  MIGRAINE) 250-250-65 MG tablet Take 1 tablet by mouth every 6 (six) hours as needed for headache.     azelastine  (ASTELIN ) 0.1 % nasal spray Place 2 sprays into both nostrils 2 (two) times daily. Use in each nostril as directed 30 mL 5   Blood Glucose Monitoring Suppl (ONETOUCH VERIO) w/Device KIT Use as advised 1 kit 1   cetirizine (ZYRTEC) 10 MG tablet Take 10 mg by mouth daily.      clobetasol  (TEMOVATE ) 0.05 % external solution MIX IN JAR OF CERAVE CREAM, THEN APPLY TO BACK TWICE DAILY UNTIL ITCH IMPROVED - AVOID FACE, GROIN, AND AXILLA (Patient taking differently: as needed. MIX IN JAR OF CERAVE CREAM, THEN APPLY TO BACK TWICE DAILY UNTIL ITCH IMPROVED - AVOID FACE, GROIN, AND AXILLA) 50 mL 1   Continuous Glucose Sensor (DEXCOM G7 SENSOR) MISC 3 each by Does not apply route every 30 (thirty) days. Apply 1 sensor every 10 days 9 each 4   docusate sodium  (COLACE) 100 MG capsule Take 300 mg by mouth at bedtime.      DULoxetine  (CYMBALTA ) 60 MG capsule TAKE 1 CAPSULE BY MOUTH DAILY 90 capsule 3   Fluocinolone  Acetonide 0.01 % OIL Apply 1-2 gtts to ears once to  twice daily as needed until improved. 20 mL 2   fluticasone  (FLONASE ) 50 MCG/ACT nasal spray Place 1 spray into both nostrils daily.     folic acid (FOLVITE) 1 MG tablet Take 1 mg by mouth daily.     furosemide  (LASIX ) 20 MG tablet Take 1 tablet (20 mg total) by mouth daily. 90 tablet 0   gabapentin  (NEURONTIN ) 100 MG capsule Take 100mg  twice a day and 2 at bedtime 360 capsule 3   Glucagon  3 MG/DOSE POWD Place 3 mg into the nose once as needed for up to 1 dose. 1 each 11   glucose blood (ONETOUCH ULTRA) test strip Use as instructed 2x a day 100 strip 11   HYDROcodone -acetaminophen  (NORCO/VICODIN) 5-325 MG tablet Take 1 tablet by mouth every 4 (four) hours as needed for moderate pain (pain score 4-6). 60 tablet 0   insulin  aspart (FIASP  FLEXTOUCH) 100 UNIT/ML FlexTouch Pen Use as instructed     Insulin  Aspart, w/Niacinamide , (FIASP ) 100 UNIT/ML SOLN Use up to 250 units daily for pump. 90 mL 11   Insulin  Disposable Pump (OMNIPOD 5 DEXG7G6 INTRO GEN 5) KIT USE AS DIRECTED 1 kit 0   Insulin  Disposable Pump (OMNIPOD 5 DEXG7G6 PODS GEN 5) MISC USE 1 POD EVERY OTHER DAY 45 each 3   insulin  glargine (LANTUS  SOLOSTAR) 100 UNIT/ML Solostar Pen Inject 25 Units into the skin at bedtime. 15 mL PRN   Insulin  Infusion Pump Supplies (ILET INFUSION-INSET 23 ) MISC CHANGE DAILY 450 each 3   Insulin  Pen Needle 32G X 4 MM MISC Use 1x a day 50 each 3   JARDIANCE  10 MG TABS tablet TAKE 1 TABLET BY MOUTH DAILY  BEFORE BREAKFAST 90 tablet 3   LUMIGAN 0.01 % SOLN Place 1 drop into both eyes at bedtime.      methocarbamol (ROBAXIN) 500 MG tablet Take 1 tablet every 8 hours by oral route as needed.     methotrexate (RHEUMATREX) 2.5 MG tablet Take by mouth.  metoprolol  succinate (TOPROL -XL) 25 MG 24 hr tablet TAKE 1 AND 1/2 TABLETS BY MOUTH  DAILY 135 tablet 3   ondansetron  (ZOFRAN -ODT) 4 MG disintegrating tablet DISSOLVE 1 TABLET ON THE TONGUE  EVERY 6 HOURS AS NEEDED FOR  NAUSEA AND VOMITING 180 tablet 0    OneTouch Delica Lancets 33G MISC Use 2x a day 100 each 11   pantoprazole  (PROTONIX ) 40 MG tablet Take 1 tablet (40 mg total) by mouth 2 (two) times daily. 180 tablet 3   potassium chloride  SA (KLOR-CON  M) 20 MEQ tablet Take 1 tablet (20 mEq total) by mouth daily. 90 tablet 3   rosuvastatin  (CRESTOR ) 10 MG tablet TAKE 1 TABLET BY MOUTH DAILY 90 tablet 2   SKYRIZI PEN 150 MG/ML pen Inject into the skin.     traZODone  (DESYREL ) 100 MG tablet TAKE 2 TABLETS BY MOUTH EVERY  NIGHT AT BEDTIME 180 tablet 3   triamterene -hydrochlorothiazide  (MAXZIDE -25) 37.5-25 MG tablet TAKE 1 TABLET BY MOUTH ONCE  DAILY 90 tablet 0   valACYclovir  (VALTREX ) 1000 MG tablet TAKE 2 TABLETS BY MOUTH AT ONSET OF SYMPTOMS AS NEEDED FOR FEVER  BLISTERS AND TAKE 2 TABLETS BY  MOUTH 12 HOURS LATER 90 tablet 3   No current facility-administered medications for this visit.    Musculoskeletal: Strength & Muscle Tone: within normal limits Gait & Station: normal Patient leans: N/A  Psychiatric Specialty Exam: Review of Systems  There were no vitals taken for this visit.There is no height or weight on file to calculate BMI.  General Appearance: {Appearance:22683}  Eye Contact:  {BHH EYE CONTACT:22684}  Speech:  Clear and Coherent  Volume:  Normal  Mood:  {BHH MOOD:22306}  Affect:  {Affect (PAA):22687}  Thought Process:  Coherent  Orientation:  Full (Time, Place, and Person)  Thought Content:  Logical  Suicidal Thoughts:  {ST/HT (PAA):22692}  Homicidal Thoughts:  {ST/HT (PAA):22692}  Memory:  Immediate;   Good  Judgement:  {Judgement (PAA):22694}  Insight:  {Insight (PAA):22695}  Psychomotor Activity:  Normal  Concentration:  Concentration: Good and Attention Span: Good  Recall:  Good  Fund of Knowledge:Good  Language: Good  Akathisia:  No  Handed:  Right  AIMS (if indicated):  not done  Assets:  Communication Skills Desire for Improvement  ADL's:  Intact  Cognition: WNL  Sleep:  {BHH GOOD/FAIR/POOR:22877}    Screenings: [employer] Office Visit from 05/21/2024 in [facility] Office Visit from 03/11/2024 in [facility] [place] Office Visit from 03[facility]12/2023 in [facility] Office Visit from 01/14/2021 in [facility] Office Visit from 12/22/2019 in [facility]  PHQ-2 Total Score 0 0 2 0 0  PHQ-9 Total Score -- -- 5 -- --   Flowsheet Row ED from 10/25/2023 in [facility] Emergency Department at [facility] ED from 07/01/2023 in [facility] Emergency Department at [facility] ED from 09/16/2021 in [facility] Emergency Department at [facility]  C-SSRS RISK CATEGORY No Risk No Risk No Risk    Assessment and Plan:  Assessment  Plan   The patient demonstrates the following risk factors for suicide: Chronic risk factors for suicide include: {Chronic Risk Factors for Dlprpiz:69585988}. Acute risk factors for suicide include: {Acute Risk Factors for Dlprpiz:69585987}. Protective factors for this patient include: {Protective Factors for Suicide Mpdx:69585986}. Considering these factors, the overall suicide risk at this point appears  to be {Desc; low/moderate/high:110033}. Patient {ACTION; IS/IS WNU:78978602} appropriate for outpatient follow up.   Collaboration of Care: {BH OP Collaboration of Care:21014065}  Patient/Guardian was advised Release of Information must be obtained prior to any record release in order to collaborate their care with an outside provider. Patient/Guardian was advised if they have not already done so to contact the registration department to sign all necessary forms in order for us  to release information regarding their care.   Consent: Patient/Guardian gives verbal consent for treatment and assignment of benefits for services provided during this visit. Patient/Guardian expressed understanding  and agreed to proceed.   Katheren Sleet, MD 11/9/202510:49 AM

## 2024-09-30 ENCOUNTER — Ambulatory Visit (INDEPENDENT_AMBULATORY_CARE_PROVIDER_SITE_OTHER): Admitting: Family

## 2024-09-30 ENCOUNTER — Other Ambulatory Visit: Payer: Self-pay

## 2024-09-30 ENCOUNTER — Encounter: Payer: Self-pay | Admitting: Family

## 2024-09-30 ENCOUNTER — Ambulatory Visit: Admitting: Psychiatry

## 2024-09-30 ENCOUNTER — Encounter: Payer: Self-pay | Admitting: Psychiatry

## 2024-09-30 ENCOUNTER — Ambulatory Visit: Payer: Self-pay | Admitting: Family

## 2024-09-30 VITALS — BP 109/71 | HR 94 | Temp 97.7°F | Ht 64.0 in | Wt 176.4 lb

## 2024-09-30 VITALS — BP 118/80 | HR 78 | Temp 98.7°F | Ht 64.0 in | Wt 176.4 lb

## 2024-09-30 DIAGNOSIS — R1312 Dysphagia, oropharyngeal phase: Secondary | ICD-10-CM | POA: Diagnosis not present

## 2024-09-30 DIAGNOSIS — Z780 Asymptomatic menopausal state: Secondary | ICD-10-CM

## 2024-09-30 DIAGNOSIS — K529 Noninfective gastroenteritis and colitis, unspecified: Secondary | ICD-10-CM

## 2024-09-30 DIAGNOSIS — I10 Essential (primary) hypertension: Secondary | ICD-10-CM | POA: Diagnosis not present

## 2024-09-30 DIAGNOSIS — Z79899 Other long term (current) drug therapy: Secondary | ICD-10-CM

## 2024-09-30 DIAGNOSIS — Z23 Encounter for immunization: Secondary | ICD-10-CM

## 2024-09-30 DIAGNOSIS — E1169 Type 2 diabetes mellitus with other specified complication: Secondary | ICD-10-CM

## 2024-09-30 DIAGNOSIS — J4520 Mild intermittent asthma, uncomplicated: Secondary | ICD-10-CM | POA: Diagnosis not present

## 2024-09-30 DIAGNOSIS — E785 Hyperlipidemia, unspecified: Secondary | ICD-10-CM

## 2024-09-30 DIAGNOSIS — R809 Proteinuria, unspecified: Secondary | ICD-10-CM | POA: Diagnosis not present

## 2024-09-30 DIAGNOSIS — F331 Major depressive disorder, recurrent, moderate: Secondary | ICD-10-CM | POA: Diagnosis not present

## 2024-09-30 DIAGNOSIS — J029 Acute pharyngitis, unspecified: Secondary | ICD-10-CM | POA: Diagnosis not present

## 2024-09-30 DIAGNOSIS — Z1231 Encounter for screening mammogram for malignant neoplasm of breast: Secondary | ICD-10-CM

## 2024-09-30 DIAGNOSIS — E042 Nontoxic multinodular goiter: Secondary | ICD-10-CM

## 2024-09-30 DIAGNOSIS — G4733 Obstructive sleep apnea (adult) (pediatric): Secondary | ICD-10-CM

## 2024-09-30 LAB — COMPREHENSIVE METABOLIC PANEL WITH GFR
ALT: 32 U/L (ref 0–35)
AST: 38 U/L — ABNORMAL HIGH (ref 0–37)
Albumin: 4.4 g/dL (ref 3.5–5.2)
Alkaline Phosphatase: 96 U/L (ref 39–117)
BUN: 17 mg/dL (ref 6–23)
CO2: 33 meq/L — ABNORMAL HIGH (ref 19–32)
Calcium: 9.9 mg/dL (ref 8.4–10.5)
Chloride: 100 meq/L (ref 96–112)
Creatinine, Ser: 0.88 mg/dL (ref 0.40–1.20)
GFR: 68.37 mL/min (ref >60.00)
Glucose, Bld: 93 mg/dL (ref 70–99)
Potassium: 3.9 meq/L (ref 3.5–5.1)
Sodium: 142 meq/L (ref 135–145)
Total Bilirubin: 0.5 mg/dL (ref 0.2–1.2)
Total Protein: 6.8 g/dL (ref 6.0–8.3)

## 2024-09-30 LAB — LIPID PANEL
Cholesterol: 141 mg/dL (ref 0–200)
HDL: 54.5 mg/dL (ref >39.00)
LDL Cholesterol: 56 mg/dL (ref 0–99)
NonHDL: 86.72
Total CHOL/HDL Ratio: 3
Triglycerides: 153 mg/dL — ABNORMAL HIGH (ref 0.0–149.0)
VLDL: 30.6 mg/dL (ref 0.0–40.0)

## 2024-09-30 LAB — CBC
HCT: 42.4 % (ref 36.0–46.0)
Hemoglobin: 13.7 g/dL (ref 12.0–15.0)
MCHC: 32.3 g/dL (ref 30.0–36.0)
MCV: 86.9 fl (ref 78.0–100.0)
Platelets: 253 K/uL (ref 150.0–400.0)
RBC: 4.88 Mil/uL (ref 3.87–5.11)
RDW: 18.9 % — ABNORMAL HIGH (ref 11.5–15.5)
WBC: 7.2 K/uL (ref 4.0–10.5)

## 2024-09-30 LAB — POCT RAPID STREP A (OFFICE): Rapid Strep A Screen: NEGATIVE

## 2024-09-30 MED ORDER — AMOXICILLIN-POT CLAVULANATE 875-125 MG PO TABS: 1.0000 | ORAL_TABLET | Freq: Two times a day (BID) | ORAL | 0 refills | Status: DC

## 2024-09-30 MED ORDER — BUPROPION HCL ER (XL) 150 MG PO TB24: 150.0000 mg | ORAL_TABLET | Freq: Every day | ORAL | 1 refills | Status: DC

## 2024-09-30 NOTE — Progress Notes (Signed)
 Established Patient Office Visit  Subjective:      CC:  Chief Complaint  Patient presents with   Establish Care    HPI: Shannon [name] is a 68 y.o. female presenting on 09/30/2024 for Establish Care .  Discussed the use of AI scribe software for clinical note transcription with the patient, who gave verbal consent to proceed.  History of Present Illness Shannon [name] is a 68 year old female with psoriatic arthritis and fibromyalgia who presents with ear pain and chronic pain management.  She has a stinging sensation in her left ear, described as a tingling or stinging feeling near the eardrum area, which has been present for the past four days. She completed a course of antibiotics about a week and a half ago. She also experiences post-nasal drainage, sinus tenderness, and nasal discharge that has progressed to yellow and green over the past week. She has been using over-the-counter cold medications and Flonase  for symptom relief.  She has a history of psoriatic arthritis and fibromyalgia, for which she takes Norco (hydrocodone /acetaminophen ) 3.5/325 mg, usually one tablet daily, sometimes two if she is very active. She also uses Skyrizi injections every twelve weeks, but reports that it does not last the full duration, causing significant pain and swelling in her hands, making it difficult to perform daily activities. She has tried other medications like Cosentyx and Cimzia, which were not effective, and Taltz, which caused yeast infections. She is currently on methotrexate 20 mg weekly and folic acid 1 mg daily.  She experiences chronic back pain and has had both hips and shoulders replaced, as well as multiple surgeries on her neck. She has been diagnosed with spondyloarthritis and has a history of disc bulges and foraminal narrowing in her lumbar spine. She has tried gabapentin  for nerve pain, currently taking 100 mg at night, but higher doses made her sleepy.  She has a history  of thyroid  issues, with a diagnosis of multinodular goiter and mild thyroid  heterogeneity. An ultrasound showed scattered small nodules. Her thyroid  levels were normal in July.  She reports difficulty swallowing, with episodes of choking on both liquids and solids, which have been worsening over time. No heartburn, as it is well-controlled with Protonix .  She has a history of diabetes, managed by Dr. Camila, with an A1c of 8.1 six months ago, now improved to 7.0. She uses an insulin  pump and Fiasp  for management.  She has a history of glaucoma, for which she follows up with an eye doctor every six months. She is unsure of the specific type of glaucoma.  She reports a history of colitis with inflammation noted during colonoscopies and a history of polyps.  She experiences significant pain during intercourse, describing it as very painful with bleeding despite using lubricants. She has a history of hysterectomy and reports dryness and changes in the vaginal area.  She has a family history of autoimmune diseases, with her daughter potentially having psoriatic arthritis and concerns about her two oldest sons experiencing similar symptoms. Her grandmother had breast cancer, and her mother had ovarian and melanoma cancer.  She uses a CPAP machine for severe sleep apnea and is currently on Cymbalta  60 mg for depression and chronic pain, which she feels has helped her cope with significant personal losses.          Social history:  Relevant past medical, surgical, family and social history reviewed and updated as indicated. Interim medical history since our last visit reviewed.  Allergies  and medications reviewed and updated.  DATA REVIEWED: CHART IN EPIC     ROS: Negative unless specifically indicated above in HPI.    Current Outpatient Medications:    albuterol  (VENTOLIN  HFA) 108 (90 Base) MCG/ACT inhaler, Inhale 2 puffs into the lungs every 6 (six) hours as needed for wheezing or  shortness of breath., Disp: 8 g, Rfl: 2   ALPRAZolam  (XANAX ) 0.25 MG tablet, Take 1 tablet (0.25 mg total) by mouth 2 (two) times daily as needed for anxiety., Disp: 20 tablet, Rfl: 0   amoxicillin -clavulanate (AUGMENTIN ) 875-125 MG tablet, Take 1 tablet by mouth 2 (two) times daily., Disp: 20 tablet, Rfl: 0   aspirin -acetaminophen -caffeine  (EXCEDRIN  MIGRAINE) 250-250-65 MG tablet, Take 1 tablet by mouth every 6 (six) hours as needed for headache., Disp: , Rfl:    azelastine  (ASTELIN ) 0.1 % nasal spray, Place 2 sprays into both nostrils 2 (two) times daily. Use in each nostril as directed, Disp: 30 mL, Rfl: 5   Blood Glucose Monitoring Suppl (ONETOUCH VERIO) w/Device KIT, Use as advised, Disp: 1 kit, Rfl: 1   cetirizine (ZYRTEC) 10 MG tablet, Take 10 mg by mouth daily. , Disp: , Rfl:    clobetasol  (TEMOVATE ) 0.05 % external solution, MIX IN JAR OF CERAVE CREAM, THEN APPLY TO BACK TWICE DAILY UNTIL ITCH IMPROVED - AVOID FACE, GROIN, AND AXILLA (Patient taking differently: as needed. MIX IN JAR OF CERAVE CREAM, THEN APPLY TO BACK TWICE DAILY UNTIL ITCH IMPROVED - AVOID FACE, GROIN, AND AXILLA), Disp: 50 mL, Rfl: 1   Continuous Glucose Sensor (DEXCOM G7 SENSOR) MISC, 3 each by Does not apply route every 30 (thirty) days. Apply 1 sensor every 10 days, Disp: 9 each, Rfl: 4   docusate sodium  (COLACE) 100 MG capsule, Take 300 mg by mouth at bedtime. , Disp: , Rfl:    DULoxetine  (CYMBALTA ) 60 MG capsule, TAKE 1 CAPSULE BY MOUTH DAILY, Disp: 90 capsule, Rfl: 3   Fluocinolone  Acetonide 0.01 % OIL, Apply 1-2 gtts to ears once to twice daily as needed until improved., Disp: 20 mL, Rfl: 2   fluticasone  (FLONASE ) 50 MCG/ACT nasal spray, Place 1 spray into both nostrils daily., Disp: , Rfl:    folic acid (FOLVITE) 1 MG tablet, Take 1 mg by mouth daily., Disp: , Rfl:    furosemide  (LASIX ) 20 MG tablet, Take 1 tablet (20 mg total) by mouth daily., Disp: 90 tablet, Rfl: 0   gabapentin  (NEURONTIN ) 100 MG capsule, Take  100mg  twice a day and 2 at bedtime, Disp: 360 capsule, Rfl: 3   Glucagon  3 MG/DOSE POWD, Place 3 mg into the nose once as needed for up to 1 dose., Disp: 1 each, Rfl: 11   glucose blood (ONETOUCH ULTRA) test strip, Use as instructed 2x a day, Disp: 100 strip, Rfl: 11   HYDROcodone -acetaminophen  (NORCO/VICODIN) 5-325 MG tablet, Take 1 tablet by mouth every 4 (four) hours as needed for moderate pain (pain score 4-6)., Disp: 60 tablet, Rfl: 0   insulin  aspart (FIASP  FLEXTOUCH) 100 UNIT/ML FlexTouch Pen, Use as instructed, Disp: , Rfl:    Insulin  Aspart, w/Niacinamide , (FIASP ) 100 UNIT/ML SOLN, Use up to 250 units daily for pump., Disp: 90 mL, Rfl: 11   Insulin  Disposable Pump (OMNIPOD 5 DEXG7G6 INTRO GEN 5) KIT, USE AS DIRECTED, Disp: 1 kit, Rfl: 0   Insulin  Disposable Pump (OMNIPOD 5 DEXG7G6 PODS GEN 5) MISC, USE 1 POD EVERY OTHER DAY, Disp: 45 each, Rfl: 3   insulin  glargine (LANTUS  SOLOSTAR) 100 UNIT/ML  Solostar Pen, Inject 25 Units into the skin at bedtime., Disp: 15 mL, Rfl: PRN   Insulin  Infusion Pump Supplies (ILET INFUSION-INSET 23 ) MISC, CHANGE DAILY, Disp: 450 each, Rfl: 3   Insulin  Pen Needle 32G X 4 MM MISC, Use 1x a day, Disp: 50 each, Rfl: 3   JARDIANCE  10 MG TABS tablet, TAKE 1 TABLET BY MOUTH DAILY  BEFORE BREAKFAST, Disp: 90 tablet, Rfl: 3   LUMIGAN 0.01 % SOLN, Place 1 drop into both eyes at bedtime. , Disp: , Rfl:    methocarbamol (ROBAXIN) 500 MG tablet, Take 1 tablet every 8 hours by oral route as needed., Disp: , Rfl:    methotrexate (RHEUMATREX) 2.5 MG tablet, Take by mouth., Disp: , Rfl:    metoprolol  succinate (TOPROL -XL) 25 MG 24 hr tablet, TAKE 1 AND 1/2 TABLETS BY MOUTH  DAILY, Disp: 135 tablet, Rfl: 3   ondansetron  (ZOFRAN -ODT) 4 MG disintegrating tablet, DISSOLVE 1 TABLET ON THE TONGUE  EVERY 6 HOURS AS NEEDED FOR  NAUSEA AND VOMITING, Disp: 180 tablet, Rfl: 0   OneTouch Delica Lancets 33G MISC, Use 2x a day, Disp: 100 each, Rfl: 11   pantoprazole  (PROTONIX ) 40 MG  tablet, Take 1 tablet (40 mg total) by mouth 2 (two) times daily., Disp: 180 tablet, Rfl: 3   potassium chloride  SA (KLOR-CON  M) 20 MEQ tablet, Take 1 tablet (20 mEq total) by mouth daily., Disp: 90 tablet, Rfl: 3   rosuvastatin  (CRESTOR ) 10 MG tablet, TAKE 1 TABLET BY MOUTH DAILY, Disp: 90 tablet, Rfl: 2   SKYRIZI PEN 150 MG/ML pen, Inject into the skin., Disp: , Rfl:    traZODone  (DESYREL ) 100 MG tablet, TAKE 2 TABLETS BY MOUTH EVERY  NIGHT AT BEDTIME, Disp: 180 tablet, Rfl: 3   triamterene -hydrochlorothiazide  (MAXZIDE -25) 37.5-25 MG tablet, TAKE 1 TABLET BY MOUTH ONCE  DAILY, Disp: 90 tablet, Rfl: 0   valACYclovir  (VALTREX ) 1000 MG tablet, TAKE 2 TABLETS BY MOUTH AT ONSET OF SYMPTOMS AS NEEDED FOR FEVER  BLISTERS AND TAKE 2 TABLETS BY  MOUTH 12 HOURS LATER, Disp: 90 tablet, Rfl: 3        Objective:        BP 118/80 (BP Location: Left Arm, Patient Position: Sitting, Cuff Size: Normal)   Pulse 78   Temp 98.7 F (37.1 C) (Temporal)   Ht 5' 4 (1.626 m)   Wt 176 lb 6.4 oz (80 kg)   SpO2 98%   BMI 30.28 kg/m   Physical Exam HEENT: Sinuses slightly tender. ABDOMEN: Abdomen slightly tender.  Wt Readings from Last 3 Encounters:  09/30/24 176 lb 6.4 oz (80 kg)  09/23/24 175 lb 6.4 oz (79.6 kg)  09/17/24 174 lb (78.9 kg)    Physical Exam Vitals reviewed.  Constitutional:      General: She is not in acute distress.    Appearance: Normal appearance. She is normal weight. She is not ill-appearing, toxic-appearing or diaphoretic.  HENT:     Head: Normocephalic.     Right Ear: Tympanic membrane normal.     Left Ear: Tympanic membrane normal.     Nose:     Right Sinus: Maxillary sinus tenderness present.     Left Sinus: Maxillary sinus tenderness present.     Mouth/Throat:     Mouth: Mucous membranes are dry.     Pharynx: Posterior oropharyngeal erythema and postnasal drip present. No oropharyngeal exudate.     Tonsils: Tonsillar exudate present. 1+ on the right. 1+ on the  left.  Eyes:     Extraocular Movements: Extraocular movements intact.     Pupils: Pupils are equal, round, and reactive to light.  Cardiovascular:     Rate and Rhythm: Normal rate and regular rhythm.     Pulses: Normal pulses.     Heart sounds: Normal heart sounds.  Pulmonary:     Effort: Pulmonary effort is normal.     Breath sounds: Normal breath sounds.  Abdominal:     Tenderness: There is abdominal tenderness in the left lower quadrant.  Musculoskeletal:     Cervical back: Normal range of motion.  Neurological:     General: No focal deficit present.     Mental Status: She is alert and oriented to person, place, and time. Mental status is at baseline.  Psychiatric:        Mood and Affect: Mood normal.        Behavior: Behavior normal.        Thought Content: Thought content normal.        Judgment: Judgment normal.          Results LABS A1c: 8.1 (03/30/2024) A1c: 7.0 (09/30/2024) Thyroid  function tests: Normal (05/2024)  RADIOLOGY Lumbar spine MRI: Disc bulges, foraminal narrowing, arthropathy (2019) Right ankle MRI: Chronic plantar fasciitis (2019) Thyroid  ultrasound: Mild thyroid  heterogeneity, scattered small nodules, several scattered additional cystic and spongiform nodules bilaterally, all 8 mm or less  Assessment & Plan:   Assessment and Plan Assessment & Plan Psoriatic arthritis with chronic pain and fibromyalgia Psoriatic arthritis with chronic pain and fibromyalgia, currently managed with Norco, gabapentin , and methotrexate. Skyrizi is being switched to [facility] due to inadequate symptom control. Chronic pain management is complicated by [facility] regulations, limiting long-term opioid prescriptions. Dietary modifications, including a gluten-free and plant-based diet, have been beneficial. Gabapentin  dosage adjustment is considered to improve pain management. - Continue Norco 3/325 mg, one tablet daily, with potential for two tablets if needed. -pdmp reviewed   -drug opioid contract signed - Increased gabapentin  to 300 mg at night and 100 mg in the morning. - Continue methotrexate 20 mg weekly and folic acid 1 mg daily per rheumatology - Switched Skyrizi to [place] pending authorization per rheumatology - Encouraged dietary modifications, including a gluten-free and plant-based diet. - Referred to pain management for chronic pain management. - Ordered urine drug screen and labs. -f/u with neurospine  Nontoxic multinodular goiter with probable Hashimoto's thyroiditis Nontoxic multinodular goiter with probable Hashimoto's thyroiditis, characterized by mild thyroid  heterogeneity and scattered small nodules. Thyroid  function tests are normal, indicating no current need for levothyroxine. - Continue to monitor thyroid  function tests annually. -tpo antbx ordered  Major depressive disorder Recent improvement in symptoms following significant life stressors. Currently managed with Cymbalta  60 mg daily. She is seeing a psychiatrist for further evaluation and potential medication adjustment. - Continue Cymbalta  60 mg daily. - Discuss potential increase in Cymbalta  dosage with psychiatrist. - Continue therapy with current therapist.  Vaginal atrophy with dyspareunia Vaginal atrophy with dyspareunia, characterized by dryness and pain during intercourse. Previous treatments with lubricants have been insufficient. Hormonal treatments are not recommended due to family history of cancer. - Recommended Replens vaginal lubricant. - Scheduled pelvic exam to assess for vaginal atrophy and other potential causes of dyspareunia.  Chronic constipation Recent decrease in bowel movement frequency, possibly contributing to abdominal discomfort.  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 No recent surgical intervention  discussed. - Encouraged  follow-up with orthopedic specialist for further evaluation and management.  Dysphagia, oropharyngeal phase Dysphagia, oropharyngeal phase, characterized by choking and difficulty swallowing, particularly with solid foods. No significant heartburn reported. - Ordered swallow study to evaluate swallowing function.  Sinusitis with postnasal drainage and left ear pain Sinusitis with postnasal drainage and left ear pain, following recent antibiotic treatment. Symptoms include stinging sensation in the left ear and nasal discharge. - Continue over-the-counter fluids and cold medications. - Use Flonase  for symptom relief.        Return in about 2 weeks (around 10/14/2024) for f/u medication change and pelvic exam .     Ginger Patrick, MSN, APRN, FNP-C [facility]

## 2024-09-30 NOTE — Patient Instructions (Addendum)
  Increase gabapentin  to 300 (3 tablets) mg at night time and continue 100 mg in the morning.   Talk to psychiatry about potential increase in cymbalta   ------------------------------------  Recommendation to start over the counter replens for you   ------------------------------------   I have sent an electronic order over to your preferred location for the following:   []   2D Mammogram  [x]   3D Mammogram  [x]   Bone Density   Please give this center a call to get scheduled at your convenience.  [x]   [facility]  [address] [place] [address]  [phone]  Make sure to wear two piece  clothing  No lotions powders or deodorants the day of the appointment Make sure to bring picture ID and insurance card.  Bring list of medications you are currently taking including any supplements.

## 2024-09-30 NOTE — Patient Instructions (Signed)
 Continue duloxetine  60 mg daily  Start bupropion 150 mg daily  Next appointment- 12/30 at 1 pm

## 2024-10-03 LAB — DRUG MONITORING, PANEL 8 WITH CONFIRMATION, URINE
6 Acetylmorphine: NEGATIVE ng/mL (ref <10)
Alcohol Metabolites: NEGATIVE ng/mL (ref <500)
Amphetamine: NEGATIVE ng/mL (ref <250)
Amphetamines: NEGATIVE ng/mL (ref <500)
Benzodiazepines: NEGATIVE ng/mL (ref <100)
Buprenorphine, Urine: NEGATIVE ng/mL (ref <5)
Cocaine Metabolite: NEGATIVE ng/mL (ref <150)
Codeine: NEGATIVE ng/mL (ref <50)
Creatinine: 130.8 mg/dL (ref >=20.0)
Hydrocodone: NEGATIVE ng/mL (ref <50)
Hydromorphone: NEGATIVE ng/mL (ref <50)
MDMA: NEGATIVE ng/mL (ref <500)
Marijuana Metabolite: NEGATIVE ng/mL (ref <20)
Methamphetamine: NEGATIVE ng/mL (ref <250)
Morphine: NEGATIVE ng/mL (ref <50)
Norhydrocodone: 335 ng/mL — ABNORMAL HIGH (ref <50)
Opiates: POSITIVE ng/mL — AB (ref <100)
Oxidant: NEGATIVE ug/mL (ref <200)
Oxycodone: NEGATIVE ng/mL (ref <100)
pH: 7.1 (ref 4.5–9.0)

## 2024-10-03 LAB — CELIAC DISEASE PANEL
(tTG) Ab, IgA: <1 U/mL
(tTG) Ab, IgG: <1 U/mL
Deamidated Gliadin Abs, IgG: <1 U/mL
Gliadin IgA: 1.2 U/mL
Immunoglobulin A: 328 mg/dL — ABNORMAL HIGH (ref 70–320)

## 2024-10-03 LAB — DM TEMPLATE

## 2024-10-03 LAB — THYROID PEROXIDASE ANTIBODIES (TPO) (REFL): Thyroperoxidase Ab SerPl-aCnc: <1 [IU]/mL (ref <9)

## 2024-10-06 ENCOUNTER — Other Ambulatory Visit: Payer: Self-pay | Admitting: Dermatology

## 2024-10-06 DIAGNOSIS — L409 Psoriasis, unspecified: Secondary | ICD-10-CM

## 2024-10-07 ENCOUNTER — Other Ambulatory Visit: Payer: Self-pay

## 2024-10-07 DIAGNOSIS — R0602 Shortness of breath: Secondary | ICD-10-CM

## 2024-10-09 ENCOUNTER — Encounter

## 2024-10-09 ENCOUNTER — Ambulatory Visit: Admitting: Pulmonary Disease

## 2024-10-10 ENCOUNTER — Ambulatory Visit (INDEPENDENT_AMBULATORY_CARE_PROVIDER_SITE_OTHER): Admitting: Clinical

## 2024-10-10 DIAGNOSIS — F331 Major depressive disorder, recurrent, moderate: Secondary | ICD-10-CM | POA: Diagnosis not present

## 2024-10-10 NOTE — Progress Notes (Signed)
   [name], LCSW
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
## 2024-10-12 ENCOUNTER — Other Ambulatory Visit: Payer: Self-pay

## 2024-10-12 ENCOUNTER — Emergency Department

## 2024-10-12 ENCOUNTER — Emergency Department
Admission: EM | Admit: 2024-10-12 | Discharge: 2024-10-12 | Disposition: A | Discharge status: Home / Self Care | Attending: Emergency Medicine | Admitting: Emergency Medicine

## 2024-10-12 DIAGNOSIS — M5431 Sciatica, right side: Secondary | ICD-10-CM

## 2024-10-12 DIAGNOSIS — I10 Essential (primary) hypertension: Secondary | ICD-10-CM | POA: Diagnosis not present

## 2024-10-12 DIAGNOSIS — E119 Type 2 diabetes mellitus without complications: Secondary | ICD-10-CM | POA: Insufficient documentation

## 2024-10-12 DIAGNOSIS — M5441 Lumbago with sciatica, right side: Secondary | ICD-10-CM | POA: Diagnosis not present

## 2024-10-12 DIAGNOSIS — J45909 Unspecified asthma, uncomplicated: Secondary | ICD-10-CM | POA: Diagnosis not present

## 2024-10-12 DIAGNOSIS — M545 Low back pain, unspecified: Secondary | ICD-10-CM | POA: Diagnosis present

## 2024-10-12 MED ORDER — LIDOCAINE 5 % EX PTCH
1.0000 | MEDICATED_PATCH | CUTANEOUS | Status: DC
  Administered 2024-10-12: 1 via TRANSDERMAL
  Filled 2024-10-12: qty 1

## 2024-10-12 MED ORDER — GABAPENTIN 300 MG PO CAPS: 300.0000 mg | ORAL_CAPSULE | Freq: Three times a day (TID) | ORAL | 0 refills | Status: AC

## 2024-10-12 MED ORDER — HYDROMORPHONE HCL 1 MG/ML IJ SOLN
1.0000 mg | Freq: Once | INTRAMUSCULAR | Status: AC
  Administered 2024-10-12: 1 mg via INTRAMUSCULAR
  Filled 2024-10-12: qty 1

## 2024-10-12 MED ORDER — DEXAMETHASONE SOD PHOSPHATE PF 10 MG/ML IJ SOLN
10.0000 mg | Freq: Once | INTRAMUSCULAR | Status: AC
  Administered 2024-10-12: 10 mg via INTRAMUSCULAR

## 2024-10-12 MED ORDER — LIDOCAINE 5 % EX PTCH: 1.0000 | MEDICATED_PATCH | CUTANEOUS | 0 refills | Status: DC

## 2024-10-12 MED ORDER — GABAPENTIN 300 MG PO CAPS
300.0000 mg | ORAL_CAPSULE | Freq: Once | ORAL | Status: AC
  Administered 2024-10-12: 300 mg via ORAL
  Filled 2024-10-12: qty 1

## 2024-10-12 NOTE — ED Provider Notes (Addendum)
 [facility] Provider Note    Event Date/Time   First MD Initiated Contact with Patient 10/12/24 1133     (approximate)  History   Chief Complaint: Hip Pain  HPI  Shannon [name] is a 68 y.o. female with a past medical history of arthritis, asthma, diabetes, hypertension, hyperlipidemia, fibromyalgia, lower back pain, presents to the emergency department for right leg pain.  According to the patient for the past 5 days she has been experiencing pain in the right hip/right leg shooting down the right leg.  Patient states a history of chronic lower back pain and sees Dr. Mozelle of [facility].  States she has had injections in the past.  Patient states this episode started 5 days ago when she was walking around [place], states she turned around and felt a pain shoot down her right leg.  Has been experiencing significant pain ever since.  Patient has an appointment coming up with Dr. Dodson but states the pain was worsening so she came to the emergency department.  No numbness or weakness.  States she feels like her leg is swollen somewhat.  No history of DVT.  Physical Exam   Triage Vital Signs: ED Triage Vitals [10/12/24 1052]  Encounter Vitals Group     BP 123/68     Girls Systolic BP Percentile      Girls Diastolic BP Percentile      Boys Systolic BP Percentile      Boys Diastolic BP Percentile      Pulse Rate 95     Resp 18     Temp 98 F (36.7 C)     Temp src      SpO2 97 %     Weight 174 lb (78.9 kg)     Height 5' 4 (1.626 m)     Head Circumference      Peak Flow      Pain Score 8     Pain Loc      Pain Education      Exclude from Growth Chart     Most recent vital signs: Vitals:   10/12/24 1052  BP: 123/68  Pulse: 95  Resp: 18  Temp: 98 F (36.7 C)  SpO2: 97%    General: Awake, no distress.  CV:  Good peripheral perfusion.  Regular rate and rhythm  Resp:  Normal effort.  Equal breath sounds bilaterally.  Abd:  No distention.    Other:  Pain in the lower back with right leg movement.  Pain is reproduced with movement in bed.  Neurovascularly intact distally.   ED Results / Procedures / Treatments    RADIOLOGY  Ultrasound negative  MEDICATIONS ORDERED IN ED: Medications  HYDROmorphone  (DILAUDID ) injection 1 mg (1 mg Intramuscular Given 10/12/24 1150)  dexamethasone  (DECADRON ) injection 10 mg (10 mg Intramuscular Given 10/12/24 1152)     IMPRESSION / MDM / ASSESSMENT AND PLAN / ED COURSE  I reviewed the triage vital signs and the nursing notes.  Patient's presentation is most consistent with acute presentation with potential threat to life or bodily function.  Patient presents to the emergency department for lower back pain with pain shooting down the right leg.  Symptoms seem very suggestive of sciatica.  Patient has a history of chronic lower back pain.  No red flags such as incontinence weakness or numbness.  I would suspect exacerbation of the patient's chronic lower back pain with likely sciatica.  I believe the best course of action  would be for the patient to follow-up with Dr. Dodson for ongoing management.  Given the patient's subjective swelling in the legs we will obtain an ultrasound to rule out DVT.  DVT ultrasound is negative.  Patient has received pain medication but currently with minimal improvement.  We will also dose 10 mg of IM Decadron .  Will prescribe a short course of pain medication going home and have the patient follow-up with her doctor.  Should the pain worsen or she develop numbness or incontinence she will return to the emergency department.  Otherwise she will follow-up with [facility].  Patient has a pain contract.  We will discharge with gabapentin  as she takes daily hydrocodone .  Will discharge with lidocaine  patches.  Provided my typical sciatica return precautions.  FINAL CLINICAL IMPRESSION(S) / ED DIAGNOSES   Sciatica  Rx / DC Orders   Gabapentin  Lidoderm   Note:  This  document was prepared using Dragon voice recognition software and may include unintentional dictation errors.   Dorothyann Drivers, MD 10/12/24 1223    Dorothyann Drivers, MD 10/12/24 1245

## 2024-10-12 NOTE — ED Notes (Signed)
 ED provider notified pt stated pain medication felt like it did nothing for the pain

## 2024-10-12 NOTE — Discharge Instructions (Signed)
 As we discussed you may use a lumbar support/back brace available at any pharmacy to help with discomfort.  Please apply a lidocaine  patch each day.  You may take your prescribed gabapentin  300 mg 3 times a day (this is instead of your daily 100 mg gabapentin ).  Please follow-up with Dr. Dodson as soon as you are able.  Return to the emergency department for any numbness or weakness of your leg, incontinence (bowel or bladder accidents) or any other symptom personally concerning to yourself.

## 2024-10-12 NOTE — ED Triage Notes (Addendum)
 Pt comes with right hip pain that radiates down her leg and knee. Pt states it hurts in her lower right sided back too. Pt Denis any injuries the patient states this started 5 days ago and has gotten worse.   Pt denies any urinary symptoms.

## 2024-10-13 ENCOUNTER — Encounter: Payer: Self-pay | Admitting: Family

## 2024-10-13 ENCOUNTER — Encounter (INDEPENDENT_AMBULATORY_CARE_PROVIDER_SITE_OTHER): Payer: Self-pay

## 2024-10-13 DIAGNOSIS — F330 Major depressive disorder, recurrent, mild: Secondary | ICD-10-CM

## 2024-10-13 DIAGNOSIS — L4050 Arthropathic psoriasis, unspecified: Secondary | ICD-10-CM

## 2024-10-13 DIAGNOSIS — G894 Chronic pain syndrome: Secondary | ICD-10-CM

## 2024-10-13 DIAGNOSIS — M949 Disorder of cartilage, unspecified: Secondary | ICD-10-CM

## 2024-10-14 ENCOUNTER — Other Ambulatory Visit: Payer: Self-pay

## 2024-10-14 ENCOUNTER — Other Ambulatory Visit: Payer: Self-pay | Admitting: Physical Medicine & Rehabilitation

## 2024-10-14 ENCOUNTER — Encounter: Payer: Self-pay | Admitting: Emergency Medicine

## 2024-10-14 ENCOUNTER — Ambulatory Visit
Admission: RE | Admit: 2024-10-14 | Discharge: 2024-10-14 | Disposition: A | Source: Ambulatory Visit | Attending: Physical Medicine & Rehabilitation | Admitting: Physical Medicine & Rehabilitation

## 2024-10-14 ENCOUNTER — Emergency Department: Admission: EM | Admit: 2024-10-14 | Discharge: 2024-10-14 | Disposition: A | Discharge status: Home / Self Care

## 2024-10-14 ENCOUNTER — Ambulatory Visit: Admitting: Family

## 2024-10-14 DIAGNOSIS — M545 Low back pain, unspecified: Secondary | ICD-10-CM | POA: Diagnosis present

## 2024-10-14 DIAGNOSIS — I10 Essential (primary) hypertension: Secondary | ICD-10-CM | POA: Insufficient documentation

## 2024-10-14 DIAGNOSIS — M5416 Radiculopathy, lumbar region: Secondary | ICD-10-CM | POA: Diagnosis present

## 2024-10-14 DIAGNOSIS — E119 Type 2 diabetes mellitus without complications: Secondary | ICD-10-CM | POA: Diagnosis not present

## 2024-10-14 LAB — CBG MONITORING, ED: Glucose-Capillary: 154 mg/dL — ABNORMAL HIGH (ref 70–99)

## 2024-10-14 MED ORDER — KETOROLAC TROMETHAMINE 10 MG PO TABS: 10.0000 mg | ORAL_TABLET | Freq: Four times a day (QID) | ORAL | 0 refills | Status: AC | PRN

## 2024-10-14 MED ORDER — CYCLOBENZAPRINE HCL 10 MG PO TABS: 10.0000 mg | ORAL_TABLET | Freq: Three times a day (TID) | ORAL | 0 refills | Status: AC | PRN

## 2024-10-14 MED ORDER — KETOROLAC TROMETHAMINE 30 MG/ML IJ SOLN
30.0000 mg | Freq: Once | INTRAMUSCULAR | Status: AC
  Administered 2024-10-14: 30 mg via INTRAVENOUS
  Filled 2024-10-14: qty 1

## 2024-10-14 MED ORDER — LIDOCAINE 5 % EX PTCH: 1.0000 | MEDICATED_PATCH | CUTANEOUS | 0 refills | Status: AC

## 2024-10-14 MED ORDER — CYCLOBENZAPRINE HCL 10 MG PO TABS
10.0000 mg | ORAL_TABLET | Freq: Once | ORAL | Status: AC
  Administered 2024-10-14: 10 mg via ORAL
  Filled 2024-10-14: qty 1

## 2024-10-14 MED ORDER — LIDOCAINE 5 % EX PTCH
1.0000 | MEDICATED_PATCH | CUTANEOUS | Status: DC
  Administered 2024-10-14: 1 via TRANSDERMAL
  Filled 2024-10-14: qty 1

## 2024-10-14 NOTE — Progress Notes (Signed)
 Chief Complaint: Chief Complaint  Patient presents with  . Back Pain  . Follow-up  Back pain traveling into the right leg  HPI: Patient is a pleasant 68 y.o. female seen in follow-up for the evaluation of back pain traveling into the right leg.  She is status post bilateral L5-S1 TFESI 05/01/2024 with 75% improvement for 5 months.  She states about a week ago she was in [place] and turned quickly and felt a sharp pain which started radiating down the right leg.  She rates her pain as an 8-9/10.  It is getting worse.  It is bad enough that she had to go to the ER in 10/12/2024.  Pain is constant, sharp, tight.  She does note weakness in the right leg but denies any numbness, tingling or loss of control of bowel or bladder.  She has been on gabapentin  as well as hydrocodone .  Unfortunately she is not noticing any improvement with the pain.  Pain is acute on chronic.  Pain is worse with standing, walking, bending, lifting, twisting and better with laying down, rest, heat and ice.   Review of Systems: A 10 point review of systems is negative, except for the pertinent positives and negatives detailed in the HPI.  PMH: Past Medical History:  Diagnosis Date  . Asthma (HHS-HCC)   . Chronic constipation   . Diabetes mellitus (CMS/HHS-HCC)   . Fatty liver   . Female bladder prolapse   . GERD (gastroesophageal reflux disease)   . Glaucoma suspect   . Hiatal hernia   . Hypertension   . Irritable bowel syndrome   . Migraines   . MVP (mitral valve prolapse)   . OA (osteoarthritis)    a) cervical spine, b) lumbar spine, c) shoulder, d) knees, e) hands  . Osteopenia   . Sleep disturbance   . Tachycardia      PSH: Past Surgical History:  Procedure Laterality Date  . APPENDECTOMY  1977  . BREAST IMPLANTS  1981  . COLONOSCOPY  11/04/1996   Normal Colon  . HEART CATH PROCEDURE  2002  . EGD  09/21/2009   No repeat per RTE  . COLONOSCOPY  09/21/2009   FH Colon Polyps (Mother/Sister)  .  COLONOSCOPY  12/22/2014   Adenomatous Polyp, FH Colon Polyps (Mother/Sister): [facility] 12/2019  . EGD  12/22/2014   No repeat per RTE  . RIGHT ROTATOR CUFF REPAIR  12/29/2019   2nd surgery: 05/03/20  . Right Rotator Cuff Repair Right 12/29/2019  . COLONOSCOPY  07/23/2020   Normal colon biopsy/PhX CP/Repeat 33yrs/TKT  . EGD  07/23/2020   Gastritis/Esophagitis/No Repeat/TKT  . ENDOSCOPIC CARPAL TUNNEL RELEASE Bilateral 10/2022   right carpal surgery (years ago dont remember date)  . EGD @ [facility]  07/18/2023   + Yeast c/w Candida SP/+for Fungi.Treated/Repeat 8 weeks/SMR  . EGD @ [facility]  10/31/2023   Normal EGD biopsy/Repeat PRN/SMR  . BLADDER TACK     x2  . HEMORRHOID REPAIR    . left shoulder surgery Left   . NECK SURGERY     fusion 3 levels  . Right Hip Replacement Right   . VAGINAL HYSTERECTOMY     for precancerous cells with oophorectomy     Family History: Family History  Problem Relation Name Age of Onset  . Gout Mother    . Arthritis Mother    . Colon polyps Mother    . Melanoma Mother    . Ovarian cancer Mother    . Arthritis Father    .  Lung cancer Father    . Arthritis Sister    . Uterine cancer Sister    . Gout Maternal Aunt    . Arthritis Maternal Aunt    . Arthritis Maternal Grandmother    . Clotting disorder Daughter    . Breast cancer Other    . Thyroid  cancer Other    . Lymphoma Other    . Arthritis Son       Social History: Social History   Socioeconomic History  . Marital status: Married  Tobacco Use  . Smoking status: Never    Passive exposure: Never  . Smokeless tobacco: Never  Vaping Use  . Vaping status: Never Used  Substance and Sexual Activity  . Alcohol use: No  . Drug use: No  . Sexual activity: Yes    Partners: Male     Allergies: Allergies  Allergen Reactions  . Insulin  Lispro Swelling  . Lisinopril Cough and Other (See Comments)  . Metformin  Diarrhea  . Nitrofurantoin Other (See Comments)    Pt unsure  . Nitrofurantoin  Monohyd/M-Cryst Unknown and Other (See Comments)    unknown  . Oxycodone  Hcl Rash  . Tylenol -Codeine #3 [Acetaminophen -Codeine] Itching  . Atorvastatin  Diarrhea    May have caused colitis--but not clear cut May have caused colitis--but not clear cut May have caused colitis--but not clear cut  . Canagliflozin  Other (See Comments)    Nausea, constipation Nausea, constipation Nausea, constipation  . Clarithromycin Unknown and Palpitations    Other reaction(s): Unknown  . Glipizide  Other (See Comments)    Sugars too variable  Sugars too variable Sugars too variable  . Oxycodone -Acetaminophen  Nausea and Itching     Medications:  Current Outpatient Medications:  .  aspirin -acetaminophen -caffeine  (EXCEDRIN  MIGRAINE) 250-250-65 mg per tablet, Take by mouth, Disp: , Rfl:  .  atorvastatin  (LIPITOR) 20 MG tablet, atorvastatin  20 mg tablet, Disp: , Rfl:  .  azelastine  (ASTELIN ) 137 mcg nasal spray, USE 1 SPRAY(S) IN EACH NOSTRIL TWICE DAILY AS DIRECTED, Disp: , Rfl:  .  bimatoprost (LUMIGAN) 0.01 % ophthalmic solution, Apply 1 drop nightly to each eye, Disp: , Rfl:  .  blood glucose diagnostic (ONETOUCH ULTRA BLUE TEST STRIP) test strip, OneTouch Ultra Blue Test Strip, Disp: , Rfl:  .  blood glucose diagnostic (ONETOUCH ULTRA BLUE TEST STRIP) test strip, USE TO CHECK BLOOD SUGAR 2  TIMES DAILY, Disp: , Rfl:  .  blood glucose meter kit, OneTouch Ultra2 Meter kit, Disp: , Rfl:  .  cetirizine (ZYRTEC) 10 MG tablet, Take by mouth, Disp: , Rfl:  .  clobetasoL  (CORMAX ) 0.05 % external solution, MIX IN JAR OF CERAVE CREAM, THEN APPLY TO BACK TWICE DAILY UNTIL ITCH IMPROVED - AVOID FACE, GROIN, AND AXILLA, Disp: , Rfl:  .  docusate (COLACE) 100 MG capsule, Take by mouth, Disp: , Rfl:  .  DULoxetine  (CYMBALTA ) 30 MG DR capsule, Take 30 mg by mouth once daily, Disp: , Rfl:  .  fluticasone  propionate (FLONASE ) 50 mcg/actuation nasal spray, 1 spray into each nostril., Disp: , Rfl:  .  folic acid  (FOLVITE) 1 MG tablet, Take 1 tablet (1 mg total) by mouth once daily, Disp: 90 tablet, Rfl: 3 .  FUROsemide  (LASIX ) 20 MG tablet, Take 1 tablet by mouth once daily, Disp: , Rfl:  .  gabapentin  (NEURONTIN ) 100 MG capsule, Take 1 capsule (100 mg total) by mouth 2 (two) times daily, Disp: 180 capsule, Rfl: 1 .  guselkumab (TREMFYA) 100 mg/mL injection syringe, Tremfya inject  100 mg SQ on Week 0 and Week 4 then every 8 weeks, Disp: 3 mL, Rfl: 2 .  HUMULIN  R U-500, CONC, INSULIN  500 unit/mL injection, USE UP TO 200 UNITS OF CONCENTRATED INSULIN  IN THE INSULIN  PUMP DAILY, Disp: , Rfl:  .  HYDROcodone -acetaminophen  (NORCO) 5-325 mg tablet, Take by mouth, Disp: , Rfl:  .  JARDIANCE  10 mg tablet, Take 10 mg by mouth once daily, Disp: , Rfl:  .  linaCLOtide (LINZESS) 72 mcg capsule, Take 1 capsule (72 mcg total) by mouth once daily Take 30 mins before food, Disp: 90 capsule, Rfl: 1 .  methotrexate (RHEUMATREX) 2.5 MG tablet, Take 8 tablets (20 mg total) by mouth every 7 (seven) days All on the same day, Disp: 96 tablet, Rfl: 1 .  metoprolol  (TOPROL -XL) 25 MG XL tablet, Take 1.5 tablets by mouth once daily, Disp: , Rfl:  .  metoprolol  TARTrate (LOPRESSOR ) 50 MG tablet, TAKE BOTH TABLETS ABOUT 2 HR PRIOR TO CARDIAC PROCEDURE, Disp: , Rfl:  .  mometasone  (ELOCON ) 0.1 % lotion, APPLY TOPICALLY TO ITCHY EARS TWICE DAILY FOR 12 DAYS MAY REPEAT AS NEEDED FOR ITCHY EARS, Disp: , Rfl:  .  ondansetron  (ZOFRAN -ODT) 4 MG disintegrating tablet, DISSOLVE 1 TABLET ON THE  TONGUE EVERY 8 HOURS AS  NEEDED FOR NAUSEA, Disp: 90 tablet, Rfl: 0 .  pantoprazole  (PROTONIX ) 40 MG DR tablet, Take 1 tablet by mouth 2 (two) times daily, Disp: , Rfl:  .  potassium chloride  (KLOR-CON  M20) 20 MEQ ER tablet, Take 1 tablet by mouth once daily, Disp: , Rfl:  .  predniSONE  (DELTASONE ) 5 MG tablet, Take as directed - 6 day taper, Disp: 21 tablet, Rfl: 0 .  RABEprazole  (ACIPHEX ) 20 mg EC tablet, Take 1 tablet (20 mg total) by mouth 2 (two)  times daily Tried and failed Protonix , Disp: 60 tablet, Rfl: 5 .  rosuvastatin  (CRESTOR ) 5 MG tablet, Take 1 tablet by mouth 3 (three) times a week, Disp: , Rfl:  .  tiZANidine  (ZANAFLEX ) 2 MG tablet, Take 2 mg by mouth 3 (three) times daily as needed, Disp: , Rfl:  .  traZODone  (DESYREL ) 100 MG tablet, 2 tab by mouth daily, Disp: , Rfl:  .  triamterene -hydrochlorothiazide  (DYAZIDE ) 37.5-25 mg capsule, 1 cap by mouth daily, Disp: , Rfl:  .  valACYclovir  (VALTREX ) 1000 MG tablet, TAKE 2 TABLETS BY MOUTH  AT ONSET OF SYMPTOMS AS NEEDED FOR FEVER BLISTERS AND TAKE 2 TABLETS 12 HOURS LATER., Disp: , Rfl:  .  celecoxib (CELEBREX) 200 MG capsule, Take 1 capsule p.o. twice daily as needed pain, Disp: 60 capsule, Rfl: 0  Vitals: Vitals:   10/14/24 0901  BP: 120/68  Pulse: 89  Temp: 36.6 C (97.8 F)  TempSrc: Oral  Weight: 81.6 kg (179 lb 14.3 oz)  Height: 162.6 cm (5' 4.02)  PainSc:   9  PainLoc: Back    Physical exam: Did not range lumbar spine as she is in significant pain; positive seated slump on the right; motor exam 5/5 bilateral lower extremities except 4/5 right dorsi flexion; sensation soft touch intact in bilateral lower extremities except decreased in the right L5 distribution  Imaging: MRI lumbar spine 2019 done [facility]: L3-4 small left lateral recess stenosis foraminal disc protrusion/annular fissure; L4-5 mild facet arthropathy; L5-S1 mild facet arthropathy with disc bulge and mild bilateral LRS and NFS  MRI lumbar spine done at [facility] 08/2023: Severe DDD L5-S1; L3-4 shallow left subarticular left foraminal disc protrusion; L4-5 mild to  moderate facet arthropathy; L5-S1 disc bulge with mild facet arthropathy  MRI thoracic spine 08/2023 done at [facility]: No fracture; mild facet arthropathy and DDD  Platelet count 253 drawn on 09/30/2024 GFR 68 drawn on 09/30/2024  Assessment: Chronic low back pain with bilateral lumbar radiculitis, right worse than left -s/p left  L5-S1 TFESI 09/27/2023 with 75% relief -s/p bilateral L5-S1 TFESI 05/01/2024 with 75% improvement for 5 months  Thoracic spine pain  History of bilateral hip replacements  Left greater trochanteric bursitis -s/p left greater trochanteric bursa injection 12/10/2023 with relief  History of diabetes with hemoglobin A1c 7.2 drawn on 09/30/2024 with insulin  pump  History of hypertension  History of psoriatic arthritis  History of fibromyalgia  Plan: She denies any problems or complications with the lumbar epidural steroid injection.  It did provide her with 75% improvement for 5 months.  Unfortunately her pain has returned.  This did cause her to go to the emergency room on 10/12/2024 in which she was given a Decadron  injection.  She also had a lower extremity ultrasound done to rule out DVT.  They did give her a prescription for gabapentin  as well as hydrocodone  and Lidoderm  patches.  I did review the ED notes.  I did discuss with the patient that I am concerned that she has a herniated disc as she is having pain radiating down the right leg in the L5 distribution.  She is also having numbness in the L5 distribution and does have weakness with right dorsiflexion.  Due to this I would like to get a stat MRI of the lumbar spine for further evaluation.  MRI lumbar spine ordered stat.  We will also get her scheduled for the next available appointment and expedite treatment for her.  I did tell her I have a low threshold to send her to neurosurgery if she does not get any better or if symptoms worsen.  Despite the risk factor of diabetes I do believe benefits outweigh the risks.  Of note patient is on chronic hydrocodone  and does have a pain contract with her PCP.  She is also on chronic gabapentin  and is unable to tolerate higher doses.  I did offer to send Celebrex to her pharmacy which I did discuss is noncontrolled medication.  She is interested in this.  Prescription for Celebrex sent to  pharmacy.  I did review the CBC, CMP and hemoglobin A1c from 09/30/2024.  Please see pertinent values above.  Follow-up 2 weeks post epidural injection.  I will officially review MRI of the lumbar spine.  I will consider referral to neurosurgery.    Patient agrees with above plan.  Answered all questions.

## 2024-10-14 NOTE — ED Triage Notes (Addendum)
 Pt arrived via POV with reports of back pain that radiates to hip and down into her knee. Pt was seen by Dr. Dodson today and just came from MRI, pt states she is having constant pain that will not stop.  Pt was seen here 2 days ago for the same. Pt states it is difficult to walk due to the pain.  Pt states  I really want to know what that MRI shows  Pt is taking Gabapentin  daily and recently increased the dose and was started on a new medication today. Pt was started on Celecoxib.

## 2024-10-14 NOTE — ED Provider Notes (Addendum)
 [facility] Provider Note    Event Date/Time   First MD Initiated Contact with Patient 10/14/24 2114     (approximate)   History   Back Pain    HPI  Shannon [name] is a 68 y.o. female    with a past medical history of chronic low back pain with bilateral lumbar radiculitis, major depressive disorder, psoriatic arthritis, diabetes type 2, who presents to the ED complaining of back pain   . According to the patient, symptoms started a week ago when she was at [facility] and turned around.  Patient was seen here last Saturday.  She was discharged with gabapentin  due to her pain management contract.  Patient was seen today by Dr. Georgina Bergeron who ordered an MRI recommended to come to the ED if the symptoms persist.  Patient endorses having constant lower lumbar back pain in the last 3 days that radiates to the right groin the right thigh and the right knee.  Patient able to sleep due to pain.  Patient denies urinary incontinence, urinary retention or fecal incontinence.  Pain increases with twisting, bending, walking, sitting down.  Patient is taking hydrocodone , celecoxib, gabapentin  without resolution of the symptoms.     Per independent chart review lumbar MRI today reported : mild disc bulging with superimposed tiny right foraminal disc  protrusion at L4-5, resulting in mild bilateral lateral recess  stenosis, with mild bilateral L4 foraminal narrowing.  2. Degenerative disc disease and facet hypertrophy at L3-4 with resultant mild narrowing of the lateral recesses bilaterally, with mild left L3 foraminal stenosis.  3. Degenerative spondylosis at L5-S1 with resultant mild left L5  foraminal stenosis.      Patient Active Problem List   Diagnosis Date Noted   Urine test positive for microalbuminuria 09/30/2024   Oropharyngeal dysphagia 09/30/2024   Obstructive sleep apnea treated with continuous positive airway pressure (CPAP) 09/30/2024   Tachycardia 09/17/2024    Sinusitis, chronic 10/30/2023   Aortic atherosclerosis 03/29/2022   Mood disorder 03/07/2022   Psoriatic arthritis (HCC) 09/16/2021   DOE (dyspnea on exertion) 03/16/2021   Gouty arthritis of right great toe 07/04/2019   Status post total hip replacement, right 05/29/2018   Fatigue 04/20/2017   Recurrent cold sores 03/07/2016   Type 2 diabetes mellitus with hyperglycemia (HCC) 11/04/2014   Noninfective gastroenteritis and colitis 03/17/2014   Hyperlipidemia associated with type 2 diabetes mellitus (HCC)    Type 2 diabetes mellitus with neurological manifestations, controlled (HCC)    FIBROCYSTIC BREAST DISEASE 06/12/2007   Essential hypertension 05/30/2007   Mitral valve disease 05/30/2007   Disorder of bone and cartilage 05/30/2007   Glaucoma 05/30/2007   Allergic rhinitis 03/20/2007   GERD 03/20/2007     Physical Exam   Triage Vital Signs: ED Triage Vitals  Encounter Vitals Group     BP 10/14/24 1921 124/76     Girls Systolic BP Percentile --      Girls Diastolic BP Percentile --      Boys Systolic BP Percentile --      Boys Diastolic BP Percentile --      Pulse Rate 10/14/24 1921 92     Resp 10/14/24 1921 18     Temp 10/14/24 1921 98.5 F (36.9 C)     Temp Source 10/14/24 1921 Oral     SpO2 10/14/24 1921 97 %     Weight 10/14/24 1922 174 lb (78.9 kg)     Height 10/14/24 1922 5' 4 (1.626  m)     Head Circumference --      Peak Flow --      Pain Score 10/14/24 1922 8     Pain Loc --      Pain Education --      Exclude from Growth Chart --     Most recent vital signs: Vitals:   10/14/24 1921  BP: 124/76  Pulse: 92  Resp: 18  Temp: 98.5 F (36.9 C)  SpO2: 97%     Physical Exam Vitals and nursing note reviewed.  During triage vital signs were normal  General:          Awake,  CV:                  Good peripheral perfusion.  Resp:               Normal effort. no tachypnea Abd:                 No distention.  Soft nontender Other:              Lumbar  spine: Skin is intact, no ecchymosis or hematomas.  Tenderness to palpation in the spinal process at the level L4 L5-S1.  Positive right SLR.  Negative saddle anesthesia.  Pulses positive.  Negative left SLR. ED Results / Procedures / Treatments   Labs (all labs ordered are listed, but only abnormal results are displayed) Labs Reviewed  CBG MONITORING, ED    PROCEDURES:  Critical Care performed:   Procedures   MEDICATIONS ORDERED IN ED: Medications  lidocaine  (LIDODERM ) 5 % 1 patch (1 patch Transdermal Patch Applied 10/14/24 2237)  ketorolac  (TORADOL ) 30 MG/ML injection 30 mg (30 mg Intravenous Given 10/14/24 2231)  cyclobenzaprine  (FLEXERIL ) tablet 10 mg (10 mg Oral Given 10/14/24 2235)   Clinical Course as of 10/14/24 2340  Tue Oct 14, 2024  2331 Reassessed the patient.  Patient was sleeping.  Patient endorses mild relief of her right lower back pain.  Patient is going to be discharged with Flexeril , naproxen, lidocaine  patch and referral to neurosurgery.  Patient is agreeable with the plan. [AE]  2339 Patient states she needs to use insulin  pump, today to removed the pump to do the MRI.  Patient does not have control about her insulin  levels.  I will order CBG before discharge [AE]    Clinical Course User Index [AE] Janit Kast, PA-C    IMPRESSION / MDM / ASSESSMENT AND PLAN / ED COURSE  I reviewed the triage vital signs and the nursing notes.  Differential diagnosis includes, but is not limited to, lumbar radiculitis, foraminal narrowing, spondylosis, unlikely cauda equina.  Patient's presentation is most consistent with acute, uncomplicated illness.   Shannon [name] is a 68 y.o., female who presents today with history of 1 week of lumbar back pain that radiates to the right groin and right thigh and right knee.  Patient was seen here 4 days ago, ruled out DVT.  Patient was seen today by Dr. Dodson  who order MRI.  On a physical exam vital signs were normal.  Patient  in moderate distress due to back pain.  Lumbar spine skin is intact no ecchymosis or hematomas.  Tenderness to palpation at the level of the L4-L5 S1.  CT right SLR.  No signs of saddle anesthesia.  Sensation is intact, pulses positive. Plan Toradol  IV Flexeril  Ice pack Lidocaine  patch CBG Reassess Reassessed the patient after getting Toradol  IV, Flexeril , ice  pack, lidocaine  patch.  Patient was able to sleep.  Patient endorses mild relief of his lumbar back pain.  CBG 154 Patient's diagnosis is consistent with lumbar radiculitis. I did not order any imaging, patient had to do an MRI.. I did review the patient's allergies and medications.The patient is in stable and satisfactory condition for discharge home  Patient will be discharged home with prescriptions for Flexeril , Toradol , lidocaine  patch. Patient is to follow up with neurosurgery as needed or otherwise directed. Patient is given ED precautions to return to the ED for any worsening or new symptoms. Discussed plan of care with patient, answered all of patient's questions, Patient agreeable to plan of care. Advised patient to take medications according to the instructions on the label. Discussed possible side effects of new medications. Patient verbalized understanding.  FINAL CLINICAL IMPRESSION(S) / ED DIAGNOSES   Final diagnoses:  Lumbar radiculitis     Rx / DC Orders   ED Discharge Orders          Ordered    cyclobenzaprine  (FLEXERIL ) 10 MG tablet  3 times daily PRN        10/14/24 2337    ketorolac  (TORADOL ) 10 MG tablet  Every 6 hours PRN       Note to Pharmacy: Patient given an IM/IV loading dose in emergency department   10/14/24 2337    lidocaine  (LIDODERM ) 5 %  Every 24 hours        10/14/24 2337             Note:  This document was prepared using Dragon voice recognition software and may include unintentional dictation errors.   Janit Kast, PA-C 10/14/24 2337    Janit Kast, PA-C 10/14/24  2346    Clarine Ozell LABOR, MD 10/14/24 [phone]

## 2024-10-14 NOTE — Discharge Instructions (Addendum)
 You have been diagnosed with lumbar radiculitis.  Please take Flexeril  1 tablet by mouth every 8 hours.  Avoid driving while taking Flexeril .  You can take Toradol  1 tablet by mouth every 6 hours as needed for pain.  Please apply lidocaine  patch 1 patch onto the skin daily for 10 days.  Please call Dr. Claudene, neurosurgeon and make an appointment for a follow-up.  Come back to ED or go to your PCP if you have new symptoms or symptoms worsen.

## 2024-10-15 MED ORDER — VENLAFAXINE HCL ER 75 MG PO CP24: 75.0000 mg | ORAL_CAPSULE | Freq: Every day | ORAL | 0 refills | Status: DC

## 2024-10-15 MED ORDER — VENLAFAXINE HCL ER 37.5 MG PO CP24: 37.5000 mg | ORAL_CAPSULE | Freq: Every day | ORAL | 0 refills | Status: DC

## 2024-10-15 MED ORDER — DULOXETINE HCL 20 MG PO CPEP: ORAL_CAPSULE | ORAL | 0 refills | Status: AC

## 2024-10-15 NOTE — Telephone Encounter (Signed)
 I've spent a total time of 10 minutes providing service to this patient-generated inquiry in the MyChart message

## 2024-10-20 MED ORDER — HYDROCODONE-ACETAMINOPHEN 5-325 MG PO TABS: 1.0000 | ORAL_TABLET | ORAL | 0 refills | Status: DC | PRN

## 2024-10-21 ENCOUNTER — Encounter: Payer: Self-pay | Admitting: Family

## 2024-10-21 ENCOUNTER — Ambulatory Visit (INDEPENDENT_AMBULATORY_CARE_PROVIDER_SITE_OTHER): Admitting: Family

## 2024-10-21 VITALS — BP 126/70 | HR 95 | Temp 98.7°F | Ht 64.0 in | Wt 175.4 lb

## 2024-10-21 DIAGNOSIS — N946 Dysmenorrhea, unspecified: Secondary | ICD-10-CM | POA: Diagnosis not present

## 2024-10-21 DIAGNOSIS — N949 Unspecified condition associated with female genital organs and menstrual cycle: Secondary | ICD-10-CM

## 2024-10-21 DIAGNOSIS — Z820 Family history of epilepsy and other diseases of the nervous system: Secondary | ICD-10-CM | POA: Insufficient documentation

## 2024-10-21 DIAGNOSIS — R239 Unspecified skin changes: Secondary | ICD-10-CM

## 2024-10-21 DIAGNOSIS — R430 Anosmia: Secondary | ICD-10-CM | POA: Insufficient documentation

## 2024-10-21 DIAGNOSIS — R251 Tremor, unspecified: Secondary | ICD-10-CM | POA: Diagnosis not present

## 2024-10-21 MED ORDER — CLOBETASOL PROPIONATE 0.05 % EX CREA: 1.0000 | TOPICAL_CREAM | Freq: Two times a day (BID) | CUTANEOUS | 0 refills | Status: AC

## 2024-10-21 NOTE — Progress Notes (Signed)
 Established Patient Office Visit  Subjective:      CC:  Chief Complaint  Patient presents with   Medical Management of Chronic Issues    HPI: Shannon [name] is a 68 y.o. female presenting on 10/21/2024 for Medical Management of Chronic Issues .  Discussed the use of AI scribe software for clinical note transcription with the patient, who gave verbal consent to proceed.  History of Present Illness Shannon [name] is a 68 year old female with chronic back pain and spinal stenosis who presents with worsening back pain and sciatica.  She has been experiencing severe back pain for a week and a half, significantly impairing her ability to walk. The pain radiates from her back to her hip and down to her knee, causing swelling and a noticeable bump on her knee. She visited the ER twice due to the intensity of the pain. The patient reports that she was told by clinicians that her MRI showed spinal stenosis and several bulging discs in the lower lumbar region. She has a history of receiving injections for her back problems and was administered a steroid injection and Dilaudid  during her ER visits. Gabapentin , taken at 300 mg twice daily, has been effective in managing her pain, although it was recommended to be taken three times a day. She was also prescribed Toradol  and Flexeril , which she uses sparingly due to their sedative effects. Despite being prescribed hydrocodone , she limits her intake to once a day. She has artificial hips and is on a pain management contract, expressing concern about the implications of this contract when receiving strong pain medications in the ER. She reports experiencing weakness in her lower legs, particularly in one leg and foot.  She reports issues with sexual health, including pain and bleeding after intercourse, and difficulty achieving orgasm. She has a history of hysterectomy and uses lubricants but still experiences significant discomfort. She notes changes in  the vaginal area, including dryness and structural changes, and has been using Replens intermittently. No itching or abnormal vaginal discharge. Burning sensation when urinating post-intercourse.  She has a history of psoriatic arthritis, ankylosing spondylitis, and fibromyalgia, which contribute to her chronic pain. She is on multiple medications, including duloxetine , which she is considering tapering off due to side effects experienced with other medications like Wellbutrin .  She reports a loss of sense of smell for about a year, which she attributes to chronic sinus issues. She has not seen an ENT recently for this issue. She also experiences tremors, particularly in her right hand.  She is concerned about memory issues, feeling scatterbrained, which she attributes to her current medication regimen and stress from her chronic pain conditions.         Social history:  Relevant past medical, surgical, family and social history reviewed and updated as indicated. Interim medical history since our last visit reviewed.  Allergies and medications reviewed and updated.  DATA REVIEWED: CHART IN EPIC     ROS: Negative unless specifically indicated above in HPI.    Current Outpatient Medications:    albuterol  (VENTOLIN  HFA) 108 (90 Base) MCG/ACT inhaler, Inhale 2 puffs into the lungs every 6 (six) hours as needed for wheezing or shortness of breath., Disp: 8 g, Rfl: 2   aspirin -acetaminophen -caffeine  (EXCEDRIN  MIGRAINE) 250-250-65 MG tablet, Take 1 tablet by mouth every 6 (six) hours as needed for headache., Disp: , Rfl:    azelastine  (ASTELIN ) 0.1 % nasal spray, Place 2 sprays into both nostrils 2 (two) times daily.  Use in each nostril as directed, Disp: 30 mL, Rfl: 5   Blood Glucose Monitoring Suppl (ONETOUCH VERIO) w/Device KIT, Use as advised, Disp: 1 kit, Rfl: 1   cetirizine (ZYRTEC) 10 MG tablet, Take 10 mg by mouth daily. , Disp: , Rfl:    clobetasol  cream (TEMOVATE ) 0.05 %, Apply  1 Application topically 2 (two) times daily., Disp: 30 g, Rfl: 0   Continuous Glucose Sensor (DEXCOM G7 SENSOR) MISC, 3 each by Does not apply route every 30 (thirty) days. Apply 1 sensor every 10 days, Disp: 9 each, Rfl: 4   docusate sodium  (COLACE) 100 MG capsule, Take 300 mg by mouth at bedtime. , Disp: , Rfl:    DULoxetine  (CYMBALTA ) 20 MG capsule, Take 2 capsules (40 mg total) by mouth daily for 7 days, THEN 1 capsule (20 mg total) daily for 7 days., Disp: 21 capsule, Rfl: 0   DULoxetine  (CYMBALTA ) 60 MG capsule, TAKE 1 CAPSULE BY MOUTH DAILY, Disp: 90 capsule, Rfl: 3   Fluocinolone  Acetonide 0.01 % OIL, Apply 1-2 gtts to ears once to twice daily as needed until improved., Disp: 20 mL, Rfl: 2   fluticasone  (FLONASE ) 50 MCG/ACT nasal spray, Place 1 spray into both nostrils daily., Disp: , Rfl:    folic acid (FOLVITE) 1 MG tablet, Take 1 mg by mouth daily., Disp: , Rfl:    furosemide  (LASIX ) 20 MG tablet, Take 1 tablet (20 mg total) by mouth daily., Disp: 90 tablet, Rfl: 0   gabapentin  (NEURONTIN ) 300 MG capsule, Take 1 capsule (300 mg total) by mouth 3 (three) times daily., Disp: 90 capsule, Rfl: 0   Glucagon  3 MG/DOSE POWD, Place 3 mg into the nose once as needed for up to 1 dose., Disp: 1 each, Rfl: 11   glucose blood (ONETOUCH ULTRA) test strip, Use as instructed 2x a day, Disp: 100 strip, Rfl: 11   HYDROcodone -acetaminophen  (NORCO/VICODIN) 5-325 MG tablet, Take 1 tablet by mouth every 4 (four) hours as needed for moderate pain (pain score 4-6)., Disp: 60 tablet, Rfl: 0   insulin  aspart (FIASP  FLEXTOUCH) 100 UNIT/ML FlexTouch Pen, Use as instructed, Disp: , Rfl:    Insulin  Aspart, w/Niacinamide , (FIASP ) 100 UNIT/ML SOLN, Use up to 250 units daily for pump., Disp: 90 mL, Rfl: 11   Insulin  Disposable Pump (OMNIPOD 5 DEXG7G6 INTRO GEN 5) KIT, USE AS DIRECTED, Disp: 1 kit, Rfl: 0   Insulin  Disposable Pump (OMNIPOD 5 DEXG7G6 PODS GEN 5) MISC, USE 1 POD EVERY OTHER DAY, Disp: 45 each, Rfl: 3    insulin  glargine (LANTUS  SOLOSTAR) 100 UNIT/ML Solostar Pen, Inject 25 Units into the skin at bedtime., Disp: 15 mL, Rfl: PRN   Insulin  Infusion Pump Supplies (ILET INFUSION-INSET 23 ) MISC, CHANGE DAILY, Disp: 450 each, Rfl: 3   Insulin  Pen Needle 32G X 4 MM MISC, Use 1x a day, Disp: 50 each, Rfl: 3   JARDIANCE  10 MG TABS tablet, TAKE 1 TABLET BY MOUTH DAILY  BEFORE BREAKFAST, Disp: 90 tablet, Rfl: 3   ketorolac  (TORADOL ) 10 MG tablet, Take 1 tablet (10 mg total) by mouth every 6 (six) hours as needed., Disp: 24 tablet, Rfl: 0   lidocaine  (LIDODERM ) 5 %, Place 1 patch onto the skin daily for 10 days. Remove & Discard patch within 12 hours or as directed by MD, Disp: 10 patch, Rfl: 0   LUMIGAN 0.01 % SOLN, Place 1 drop into both eyes at bedtime. , Disp: , Rfl:    methotrexate (RHEUMATREX) 2.5 MG tablet,  Take by mouth., Disp: , Rfl:    metoprolol  succinate (TOPROL -XL) 25 MG 24 hr tablet, TAKE 1 AND 1/2 TABLETS BY MOUTH  DAILY, Disp: 135 tablet, Rfl: 3   ondansetron  (ZOFRAN -ODT) 4 MG disintegrating tablet, DISSOLVE 1 TABLET ON THE TONGUE  EVERY 6 HOURS AS NEEDED FOR  NAUSEA AND VOMITING, Disp: 180 tablet, Rfl: 0   OneTouch Delica Lancets 33G MISC, Use 2x a day, Disp: 100 each, Rfl: 11   pantoprazole  (PROTONIX ) 40 MG tablet, Take 1 tablet (40 mg total) by mouth 2 (two) times daily., Disp: 180 tablet, Rfl: 3   potassium chloride  SA (KLOR-CON  M) 20 MEQ tablet, Take 1 tablet (20 mEq total) by mouth daily., Disp: 90 tablet, Rfl: 3   rosuvastatin  (CRESTOR ) 10 MG tablet, TAKE 1 TABLET BY MOUTH DAILY, Disp: 90 tablet, Rfl: 2   SKYRIZI PEN 150 MG/ML pen, Inject into the skin., Disp: , Rfl:    traZODone  (DESYREL ) 100 MG tablet, TAKE 2 TABLETS BY MOUTH EVERY  NIGHT AT BEDTIME, Disp: 180 tablet, Rfl: 3   triamterene -hydrochlorothiazide  (MAXZIDE -25) 37.5-25 MG tablet, TAKE 1 TABLET BY MOUTH ONCE  DAILY, Disp: 90 tablet, Rfl: 0   valACYclovir  (VALTREX ) 1000 MG tablet, TAKE 2 TABLETS BY MOUTH AT ONSET OF  SYMPTOMS AS NEEDED FOR FEVER  BLISTERS AND TAKE 2 TABLETS BY  MOUTH 12 HOURS LATER, Disp: 90 tablet, Rfl: 3   venlafaxine  XR (EFFEXOR -XR) 37.5 MG 24 hr capsule, Take 1 capsule (37.5 mg total) by mouth daily with breakfast for 14 days., Disp: 14 capsule, Rfl: 0   [START ON 10/29/2024] venlafaxine  XR (EFFEXOR -XR) 75 MG 24 hr capsule, Take 1 capsule (75 mg total) by mouth daily with breakfast. Start after completing 37.5 mg daily for two weeks, Disp: 30 capsule, Rfl: 0        Objective:        BP 126/70 (BP Location: Left Arm, Patient Position: Sitting, Cuff Size: Large)   Pulse 95   Temp 98.7 F (37.1 C) (Temporal)   Ht 5' 4 (1.626 m)   Wt 175 lb 7.2 oz (79.6 kg)   SpO2 98%   BMI 30.12 kg/m   Physical Exam GENITOURINARY: Vaginal irritation, vulva and clitoris appear smaller and different, redness of labial lips, white changes in vaginal area, mild vaginal prolapse.  Wt Readings from Last 3 Encounters:  10/21/24 175 lb 7.2 oz (79.6 kg)  10/14/24 174 lb (78.9 kg)  10/12/24 174 lb (78.9 kg)    Physical Exam Vitals reviewed.  Genitourinary:    Labia:        Right: Rash and tenderness present.        Left: Rash and tenderness present.      Urethra: Prolapse (mild) present. No urethral pain.     Vagina: Tenderness and lesions (white discoloration right vaginal opening) present. No vaginal discharge.     Rectum: Normal.          Results RADIOLOGY Lumbar spine MRI: Spinal stenosis in multiple locations, disc bulges, correlating with sciatica symptoms (10/15/2024)  Assessment & Plan:   Assessment and Plan Assessment & Plan Lumbar spinal stenosis with radiculopathy and chronic pain Chronic lumbar spinal stenosis with radiculopathy causing significant pain and weakness in the lower extremities. Recent exacerbation required ER visits and steroid injections. Gabapentin  has been effective in managing symptoms. Concerns about potential surgery, but currently not interested in  surgical intervention. Discussion about the possibility of a spinal cord stimulator as a less invasive alternative to surgery. - Continue  gabapentin  regimen. - Referred to pain management for potential spinal cord stimulator evaluation. - Continue follow-up with spinal specialist for ongoing management.  Psoriatic arthritis, ankylosing spondylitis, and fibromyalgia Chronic conditions contributing to overall pain and discomfort. Managed with multiple medications including gabapentin  and duloxetine .    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 Possible lichen sclerosus contributing to symptoms. Previous use of Replens and clobetasol  for symptom management. Discussion about the potential for skin cancer with lichen sclerosus and the need for careful monitoring. - Prescribed clobetasol  cream for vulvar area, apply once daily for one month, then alternate days for the second month, and twice weekly thereafter. - Referred to gynecology for further evaluation and management. - Encouraged use of Replens lubricant at bedtime.  Tremor, right hand predominant Intermittent tremor in the right hand, possibly related to family history of Parkinson's disease. No prior neurology evaluation. - Referred to neurology for evaluation of tremor and potential Parkinson's disease.  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 Possible relation to chronic sinusitis or neurological issues. - f/u with ENT for evaluation of anosmia and potential sinus involvement. - Referred to neurology for evaluation of potential neurological causes.  Cognitive symptoms (memory loss, scatterbrained) Reports of memory loss and feeling scatterbrained. Possible relation to medication burden and chronic pain. Previous labs for B12 and thyroid  function were normal. - Referred to neurology for evaluation of  cognitive symptoms. - Continue to monitor medication regimen for potential cognitive effects.  Depression and insomnia Chronic depression and insomnia managed with duloxetine  and trazodone . Discussion about potential medication adjustments with psychiatrist. Consideration of cross-tapering duloxetine  to venlafaxine . - Discuss medication management with psychiatrist, including potential cross-tapering of duloxetine  to venlafaxine .  Chronic sinusitis Contributing to anosmia. No recent ENT evaluation. - Referred to ENT for evaluation of chronic sinusitis and its impact on anosmia.        Return in about 3 months (around 01/19/2025) for f/u CPE.     Ginger Patrick, MSN, APRN, FNP-C [facility]

## 2024-10-21 NOTE — Patient Instructions (Addendum)
  For the treatment of lichen sclerosus with clobetasol , apply a thin layer of clobetasol  propionate 0.05% cream or ointment to the affected areas twice daily for up to 4 weeks, with caution to avoid prolonged use due to the risk of skin atrophy.  Apply a thin layer to the affected vulvar, labial, and perineal areas twice daily, once in the morning and once at night.  For a longer regimen, apply a thin layer once daily for the first month, then on alternate days for the second month, and then twice weekly for the third month.[1]  Continual application may lead to atrophy of the vulva, so intermittent use may be necessary to maintain results.[2]  If symptoms resolve in less than 3 months, the ointment can be applied twice weekly for the remainder of the treatment period   ------------------------------------  A referral was placed today for neurology and gynecology  Please let us  know if you have not heard back within 2 weeks about the referral.   ------------------------------------

## 2024-10-22 LAB — WET PREP BY MOLECULAR PROBE
Candida species: NOT DETECTED
Gardnerella vaginalis: NOT DETECTED
MICRO NUMBER:: 17301585
SPECIMEN QUALITY:: ADEQUATE
Trichomonas vaginosis: NOT DETECTED

## 2024-10-23 ENCOUNTER — Ambulatory Visit: Payer: Self-pay | Admitting: Family

## 2024-10-24 ENCOUNTER — Ambulatory Visit: Admitting: Clinical

## 2024-10-24 DIAGNOSIS — F331 Major depressive disorder, recurrent, moderate: Secondary | ICD-10-CM

## 2024-10-24 NOTE — Progress Notes (Signed)
   [name], LCSW

## 2024-10-24 NOTE — Progress Notes (Signed)
 [facility] Counselor/Therapist Progress Note  Patient ID: Shannon [name], MRN: [id number],    Date: 10/24/2024  Time Spent: 10:33am - 11:27am : 54 minutes   Treatment Type: Individual Therapy  Reported Symptoms: negative thought patterns  Mental Status Exam: Appearance:  Neat and Well Groomed     Behavior: Appropriate  Motor: Normal  Speech/Language:  Clear and Coherent and Normal Rate  Affect: Appropriate  Mood: normal  Thought process: normal  Thought content:   WNL  Sensory/Perceptual disturbances:   WNL  Orientation: oriented to person, place, time/date, and situation  Attention: Good  Concentration: Good  Memory: WNL  Fund of knowledge:  Good  Insight:   Good  Judgment:  Good  Impulse Control: Good   Risk Assessment: Danger to Self:  No Patient denied current suicidal ideation   Self-injurious Behavior: No Danger to Others: No Patient denied current homicidal ideation Duty to Warn:no Physical Aggression / Violence:No  Access to Firearms a concern: No  Gang Involvement:No   Subjective: Patient stated, my state of mind has been better again. Patient reported recent difficulty walking due to patient's back and was seen in the emergency room twice. Patient reported receiving injections in patient's back this week and stated, getting much much much better with that. Patient stated, that was a struggle, I didn't let it get me down in reference to patient's back. Patient reported recent conflict with husband and feels husband is angry with patient due to patient's conversation with a phone representative. Patient reported feeling husband overreacted and feels husband's response is a reflection of insecurities. Patient reported husband makes accusatory statements towards patient and patient stated, it makes me mad.  Patient stated, I feel like that's unfair and reported feeling anger and hurt in response. Patient stated, maybe I give my feelings  too much power. Patient reported making excuses for other's behaviors. During today's session patient shared examples of enabling behaviors as it relates to patient's interactions with family members. Patient stated, its better now than it was before we started talking in reference to patient's mood. Patient stated,  I've realized I can't be responsible for what happened, [name] got to minimize it instead of maximize it in reference to conflict with husband and the impact on patient.   Interventions: Cognitive Behavioral Therapy and Interpersonal. Clinician conducted session via caregility video from clinician's home office. Patient provided verbal consent to proceed with telehealth session and is aware of limitations of telephone or video visits. Patient participated in session from patient's home. Reviewed events since last session and assessed for changes. Reviewed recent health concerns. Discussed recent conflict with husband. Explored and identified thoughts and feelings triggered by conflict with husband. Assisted patient in challenging thoughts related to conflict with husband. Provided psycho education related to challenging thoughts using socratic questioning. Provided psycho education related to fair fighting rules, DBT skills and wise mind, enabling behaviors, and positive self talk. Clinician requested for homework patient practice challenging negative thought patterns using socratic questioning.    Collaboration of Care: not required at this time   Diagnosis:  Major depressive disorder, recurrent episode, moderate (HCC)     Plan: Patient is to utilize [employer] Therapy, thought re-framing, mindfulness and coping strategies to decrease symptoms associated with their diagnosis. Frequency: bi-weekly  Modality: individual      Long-term goal:   Reduce overall level, frequency, and intensity of the feelings of depression as evidenced by decrease in grief, loss of interest,  depressed mood, isolation, increased  sleep, low energy, psychomotor retardation, fatigue, feelings of guilt, lack of concentration, loss of motivation from 7 days/week to 0 to 1 days/week per patient report for at least 3 consecutive months. Target Date: 05/21/25  Progress: progressing    Short-term goal:  Begin healthy grieving process as it relates to multiple losses patient has experienced (mother, father, sister, mother in law) Target Date: 05/21/25  Progress: progressing    Develop and implement effective communication strategies for patient to utilize when expressing her thoughts and feelings to others in a controlled and assertive way  Target Date: 05/21/25  Progress: progressing    Increase patient's participation in positive activities from 0 days per week to 3 per day week Target Date: 05/21/25  Progress: progressing    Develop and implement strategies to increase feelings of closeness in patient's relationship with husband Target Date: 05/21/25  Progress: progressing    Increase communication/contact with patient's children Target Date: 05/21/25  Progress: progressing    [name], LCSW

## 2024-10-31 ENCOUNTER — Ambulatory Visit: Admitting: Clinical

## 2024-11-04 ENCOUNTER — Other Ambulatory Visit: Payer: Self-pay | Admitting: *Deleted

## 2024-11-04 MED ORDER — TRIAMTERENE-HCTZ 37.5-25 MG PO TABS: 1.0000 | ORAL_TABLET | Freq: Every day | ORAL | 1 refills | Status: AC

## 2024-11-05 ENCOUNTER — Other Ambulatory Visit: Payer: Self-pay

## 2024-11-07 ENCOUNTER — Ambulatory Visit (INDEPENDENT_AMBULATORY_CARE_PROVIDER_SITE_OTHER): Admitting: Clinical

## 2024-11-07 DIAGNOSIS — F331 Major depressive disorder, recurrent, moderate: Secondary | ICD-10-CM | POA: Diagnosis not present

## 2024-11-07 NOTE — Progress Notes (Signed)
   [name], LCSW

## 2024-11-07 NOTE — Progress Notes (Signed)
 "  [facility] Counselor/Therapist Progress Note  Patient ID: Shannon [name], MRN: [id number],    Date: 11/07/2024  Time Spent: 10:35am - 11:38am : 63 minutes   Treatment Type: Individual Therapy  Reported Symptoms: anxiety  Mental Status Exam: Appearance:  Neat and Well Groomed     Behavior: Appropriate  Motor: Normal  Speech/Language:  Clear and Coherent and Normal Rate  Affect: Tearful when discussing anniversary  Mood: Patient stated, I'm feeling fine  Thought process: normal  Thought content:   WNL  Sensory/Perceptual disturbances:   WNL  Orientation: oriented to person, place, time/date, and situation  Attention: Good  Concentration: Good  Memory: WNL  Fund of knowledge:  Good  Insight:   Good  Judgment:  Good  Impulse Control: Good   Risk Assessment: Danger to Self:  No Patient denied current suicidal ideation  Self-injurious Behavior: No Danger to Others: No  Patient denied current homicidal ideation Duty to Warn:no Physical Aggression / Violence:No  Access to Firearms a concern: No  Gang Involvement:No   Subjective: Patient reported patient received injections in patient's back and stated, they have helped tremendously. Patient stated, Its been a big big strain in reference to decorating for the holiday. Patient stated, determination, I'm going to trying to make this happen in reference to upcoming holiday. Patient reported patient is planning two family gatherings at patient's home for the holiday. Patient stated, I'm looking forward to that stuff in reference to upcoming family gatherings. Patient stated, I'm stressing right now and reported stress due to family events on Saturday and Sunday. Patient reported today's is patient/husband's 66 th anniversary and patient stated, I'm feeling a little bit emotional about that. Patient stated, last night things got a little tense in reference to conversation with husband. Patient reported  husband interrupted patient's conversation and notified patient how long patient had been talking.  Patient stated, I got quiet, I just stopped in response to conversation with husband. Patient stated, Im very anxious about this anniversary and reported anxiety due to recent conflict. Patient stated,  I can see that being a great tool in reference to writing weekly update. Patient reported feeling husband's experience with ADHD impacts patient/husband's communication. Patient reported using prayer to cope with conflict in marriage. Patient stated, I take it so personal in reference to conflict with husband. Patient reported feeling hurt and stated, hurt leads to anger. Patient reported patient's daughter has a diagnosis of ADHD and patient reported concern regarding daughter's mental health. Patient stated,  I was discouraged when I got out of the bed, it has just helped me to talk about this in reference to today's session. Patient stated, I'm feeling fine in response to current mood.  Interventions: Cognitive Behavioral Therapy and Interpersonal. Clinician conducted session via caregility video from clinician's home office. Patient provided verbal consent to proceed with telehealth session and is aware of limitations of telephone or video visits. Patient participated in session from patient's home. Reviewed events since last session and assessed for changes. Discussed changes in patient's health/pain level and impact on patient's activity level. Discussed current stressors. Discussed recent conflict with husband and patient's response. Explored thoughts and feelings triggered by conflict with husband and anniversary. Discussed communication strategies, such as, writing weekly update. Provided psycho education related to symptoms of ADHD. Discussed patient's concerns related to daughter. Provided mental health resources, such as, [employer], [facility] urgent [facility], [employer].     Collaboration of Care: not  required at this time   Diagnosis:  Major depressive disorder, recurrent episode, moderate (HCC)     Plan: Patient is to utilize [employer] Therapy, thought re-framing, mindfulness and coping strategies to decrease symptoms associated with their diagnosis. Frequency: bi-weekly  Modality: individual      Long-term goal:   Reduce overall level, frequency, and intensity of the feelings of depression as evidenced by decrease in grief, loss of interest, depressed mood, isolation, increased sleep, low energy, psychomotor retardation, fatigue, feelings of guilt, lack of concentration, loss of motivation from 7 days/week to 0 to 1 days/week per patient report for at least 3 consecutive months. Target Date: 05/21/25  Progress: progressing    Short-term goal:  Begin healthy grieving process as it relates to multiple losses patient has experienced (mother, father, sister, mother in law) Target Date: 05/21/25  Progress: progressing    Develop and implement effective communication strategies for patient to utilize when expressing her thoughts and feelings to others in a controlled and assertive way  Target Date: 05/21/25  Progress: progressing    Increase patient's participation in positive activities from 0 days per week to 3 per day week Target Date: 05/21/25  Progress: progressing    Develop and implement strategies to increase feelings of closeness in patient's relationship with husband Target Date: 05/21/25  Progress: progressing    Increase communication/contact with patient's children Target Date: 05/21/25  Progress: progressing     [name], LCSW    "

## 2024-11-11 ENCOUNTER — Ambulatory Visit: Admitting: Nurse Practitioner

## 2024-11-11 ENCOUNTER — Encounter
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
## 2024-11-17 ENCOUNTER — Telehealth: Payer: Self-pay | Admitting: Family

## 2024-11-17 ENCOUNTER — Encounter: Payer: Self-pay | Admitting: Internal Medicine

## 2024-11-17 ENCOUNTER — Telehealth: Payer: Self-pay

## 2024-11-17 ENCOUNTER — Ambulatory Visit: Admitting: Internal Medicine

## 2024-11-17 VITALS — BP 120/60 | HR 92 | Ht 64.0 in | Wt 175.2 lb

## 2024-11-17 DIAGNOSIS — Z794 Long term (current) use of insulin: Secondary | ICD-10-CM

## 2024-11-17 DIAGNOSIS — E782 Mixed hyperlipidemia: Secondary | ICD-10-CM

## 2024-11-17 DIAGNOSIS — E1142 Type 2 diabetes mellitus with diabetic polyneuropathy: Secondary | ICD-10-CM | POA: Diagnosis not present

## 2024-11-17 DIAGNOSIS — E66811 Obesity, class 1: Secondary | ICD-10-CM

## 2024-11-17 DIAGNOSIS — E041 Nontoxic single thyroid nodule: Secondary | ICD-10-CM

## 2024-11-17 DIAGNOSIS — E1165 Type 2 diabetes mellitus with hyperglycemia: Secondary | ICD-10-CM

## 2024-11-17 LAB — POCT GLYCOSYLATED HEMOGLOBIN (HGB A1C): Hemoglobin A1C: 7.4 % — AB (ref 4.0–5.6)

## 2024-11-17 MED ORDER — LANTUS SOLOSTAR 100 UNIT/ML ~~LOC~~ SOPN: 35.0000 [IU] | PEN_INJECTOR | Freq: Every day | SUBCUTANEOUS | Status: AC

## 2024-11-17 MED ORDER — GLUCAGON 3 MG/DOSE NA POWD: 3.0000 mg | Freq: Once | NASAL | 11 refills | Status: AC | PRN

## 2024-11-17 NOTE — Telephone Encounter (Signed)
 Pt's last CPE was 03/11/2024. Pt has been made aware of this date. Nothing further was needed.

## 2024-11-17 NOTE — Telephone Encounter (Signed)
 Copied from CRM #[id number]. Topic: Referral - Status >> Nov 17, 2024  2:16 PM Nessti S wrote: Reason for CRM: pt called about neurology and gynecology referral. Was told that pcp has not closed out notes for referral to be processed.

## 2024-11-17 NOTE — Patient Instructions (Addendum)
 Please continue the iLet pump.    Try your best to announce all meals.  Back up plan (off the pump): - Lantus  35 units at bedtime - FiAsp   7-8 units before b'fast 9-10 units before lunch 11-12 units before dinner   Also, continue: - Jardiance  10 mg before b'fast.   Please return in 3-4 months.

## 2024-11-17 NOTE — Telephone Encounter (Signed)
 Copied from CRM #[id number]. Topic: General - Other >> Nov 17, 2024  2:18 PM Nessti S wrote: Reason for CRM: pt called to receive date of last physical. Would like a call back soon as possible with date     ----------------------------------------------------------------------- From previous Reason for Contact - Other: Reason for CRM: pt called to receive date of last physical. Would like a call back soon as possible with date

## 2024-11-17 NOTE — Progress Notes (Signed)
 Patient ID: Shannon [name], female   DOB: [birth year], 68 y.o.   MRN: [id number]   HPI: Shannon [name] is a 68 y.o.-year-old female, initially referred by her PCP, Dr. Jimmy, returning for follow-up for DM2, dx in 2012, insulin -dependent since ~2019, uncontrolled, with complications (peripheral neuropathy). Last visit 4 months ago.  Her daughter, [name], is also my patient.  Interim history: She has no nausea, increased urination, nausea.  She does have blurry vision - glaucoma. She gets steroid inj in back and hips after the pain became unbearable.  Sugars have been higher so she started to eat less  Reviewed history: Patient described that her sugars worsened after she had to stop Metformin  due to GI symptoms and was changed to sulfonylurea. They increased even further after steroid injection for Achilles tendinitis.  Her sugars were initially in the 300-400 range, but then increased to 500-HI (>600) >> she presented to the emergency room (11/12/2020).  Afterwards, she started to work on her diet.  Insulin  pump: -OmniPod Dash >> Omnipod 5  Insulin : -She has allergy to Humalog/Lyumjev  >> significant irritation and inflammation at the infusion site -Switched to NovoLog  samples since NovoLog  was not covered by her insurance despite 2 preauthorization requests - U500 now  CGM: -Dexcom G6  Supplies: -Mail-order pharmacy for Dexcom  Reviewed HbA1c levels: Lab Results  Component Value Date   HGBA1C 7.2 (A) 07/14/2024   HGBA1C 8.1 (H) 03/11/2024   HGBA1C 7.4 (A) 11/06/2023   HGBA1C 6.9 (A) 03/06/2023   HGBA1C 7.3 (A) 11/23/2022   HGBA1C 8.5 (A) 07/20/2022   HGBA1C 8.5 (A) 03/16/2022   HGBA1C 8.2 (A) 11/15/2021   HGBA1C 8.0 (A) 07/22/2021   HGBA1C 10.4 (A) 02/15/2021   HGBA1C 9.0 (A) 11/16/2020   HGBA1C 7.8 (A) 07/13/2020   HGBA1C 8.1 (H) 12/22/2019   HGBA1C 7.3 (A) 07/04/2019   HGBA1C 8.9 (H) 01/22/2019   HGBA1C 8.8 (H) 10/15/2018   HGBA1C 7.9 (H) 02/13/2018   HGBA1C  7.0 (H) 09/19/2017   HGBA1C 8.6 (H) 04/13/2017   HGBA1C 8.2 (H) 11/16/2016   HGBA1C 7.4 (H) 05/17/2016   HGBA1C 7.8 (H) 11/19/2015   HGBA1C 7.2 (H) 04/23/2015   HGBA1C 8.4 (H) 01/14/2015   HGBA1C 8.6 (H) 09/22/2014   HGBA1C 8.0 (H) 03/31/2014   HGBA1C 6.9 (H) 09/23/2013   HGBA1C 7.0 (H) 03/17/2013   HGBA1C 6.5 09/16/2012   HGBA1C 6.6 (H) 02/20/2012   HGBA1C 9.0 (H) 10/19/2011  07/06/2023: HbA1c 6.8%  Previously on: - Glimepiride  2 >> 4 mg before breakfast   - Onglyza 5 mg before breakfast - Lantus  30-35 units at bedtime She is intolerant to Metformin  >> N/V/D >> had to stop and changed to Glimepiride .  Previously on: - Ozempic  0.5 mg weekly >> stopped 2/2 nausea - Lantus  26 >> 34 >> Toujeo  46 units at bedtime - Lyumjev  6-10 >> 12-18 >> 15 >> 20 units before each meal  Now on: - Pump settings prev. with U500 - now FiAsp : - basal rates: 12 am: 1.0  3 am: 0.9  6 am: 0.7  9 am: 1 9 PM: 1.1  - ICR:  (start the bolus 45-60 min before the meal)  12 AM: 1:11 >> 8  12 pm: 1:10  4 PM: 1:10 8 PM: 1:8 - target: 120-120  - ISF: 1:60 - Active insulin  time: 6 hours  Also, continue: - Jardiance  10 mg before b'fast -restarted 11/2022 -  -started 11/2022 >> stopped 2/2 severe constipation  TDD from basal  insulin :  62% >> 69% >> 73% (27 units) >> 72% TDD from bolus insulin : 20% >> 32% >> 27% (10 units) >> 28% Total daily dose: 32-40 >> 40-50 U500 units/day >> up to 250 units of insulin  a day - extended bolusing: not using - changes infusion site: q1-2 days  She checks her sugars more than 4 times a day with her  Dexcom CGM:   Prev.:  Previously:  Prev.:   Lowest sugar was LO >> .SABRA. 40 >> 39 (sensor) >> 50s >> 41; she has hypoglycemia awareness at 110.  Highest sugar was HI ... >> 300s >> 300s >> 300s  Glucometer: One Touch Verio  Pt's meals are: - Breakfast: usually skips, when eats b'fast: sausage bisquit - Lunch: salad or sandwich - Dinner: meat and 2 veggies -  Snacks: 1-2: apples, oranges, occasionally chips  -+ CKD, last BUN/creatinine:  Lab Results  Component Value Date   BUN 17 09/30/2024   BUN 15 05/21/2024   CREATININE 0.88 09/30/2024   CREATININE 0.81 05/21/2024   Lab Results  Component Value Date   MICRALBCREAT 20.6 03/11/2024  She is not on ACE inhibitor/ARB. She had cough with lisinopril.  -+ HL; last set of lipids: Lab Results  Component Value Date   CHOL 141 09/30/2024   HDL 54.50 09/30/2024   LDLCALC 56 09/30/2024   LDLDIRECT 116.0 03/12/2023   TRIG 153.0 (H) 09/30/2024   CHOLHDL 3 09/30/2024  On Crestor  5 mg  daily.  - last eye exam was in 06-077/2025 No DR reportedly. She has glaucoma - exams q6 mo. Her blurry vision improved (had laser surgery for glaucoma 05/2021). She has cataracts.   - She has numbness and tingling in her toes. She also has muscle cramps in her feet.  Last foot exam 03/19/2024.  Pt has FH of DM in mother, son, daughter, sister, MGM, MGF, M aunt and uncles.  She also has a history of HTN, fatty liver, GERD. She has psoriatic arthritis and ankylosing spondylitis. She has significant joint pain - hands, back, hips.  She is on a biologic infusion (Cosentyx).  She was also diagnosed with fibromyalgia. She presented to the ED for nausea on 12/24/2019.  Per review of the labs, she appeared dehydrated, but no distinct pathology was found.  Of note, lipase was normal. She started Lopressor  for HTN - but had dizziness >> stopped. She has a family history of Parkinson's.  She had a thyroid  ultrasound (06/03/2024): Small thyroid  gland, with 1 dominant nodule of 1 cm, spongiform, isoechoic  TSH was normal: Lab Results  Component Value Date   TSH 1.03 05/21/2024   ROS: + see HPI  I reviewed pt's medications, allergies, PMH, social hx, family hx, and changes were documented in the history of present illness. Otherwise, unchanged from my initial visit note.  Past Medical History:  Diagnosis Date   Actinic  keratosis    Allergy    Arthritis    hip, back, shoulders, joints   Asthma    Complication of anesthesia    problems with block during breast augmentation and epidural during childbirth   Depression    Diabetes mellitus 09/2011   type 2   Family history of adverse reaction to anesthesia    PROBLEMS WITH BLOCKS   Fatty liver    Fibromyalgia    GERD (gastroesophageal reflux disease)    Glaucoma suspect    Headache    migraines-4x a week headache-1x week migraine   History of hiatal hernia  Hyperlipidemia    Hypertension    Controlled on meds   Mitral valve prolapse    Nose colonized with MRSA 05/15/2018   Osteopenia    Pneumonia    PONV (postoperative nausea and vomiting)    Psoriasis    Shingles 11/2006   Sleep disturbance    Tachycardia    Vertigo    Hx of   Past Surgical History:  Procedure Laterality Date   ABDOMINAL HYSTERECTOMY  2000   /BSO - abn. Paps   APPENDECTOMY  1978   AUGMENTATION MAMMAPLASTY Bilateral 1981   BLADDER REPAIR     x2   BREAST ENHANCEMENT SURGERY  1987   CARDIAC CATHETERIZATION     [facility]   CARDIAC CATHETERIZATION Left 12/18/2016   Procedure: Left Heart Cath and Coronary Angiography;  Surgeon: Deatrice DELENA Cage, MD;  Location: [facility] INVASIVE [facility];  Service: Cardiovascular;  Laterality: Left;   CARPAL TUNNEL RELEASE Bilateral    CERVICAL DISCECTOMY  11/2009   with synthetic discs inserted--  Dr Mavis   COLONOSCOPY     JOINT REPLACEMENT     Left Hip Replacement Left 02/01/2023   NM MYOVIEW  LTD  03/2005   EF 56%, no ischemia   RECTAL PROLAPSE REPAIR     x1   SHOULDER ARTHROSCOPY WITH OPEN ROTATOR CUFF REPAIR Left 12/29/2019   Procedure: LEFT SHOULDER ARTHROSCOPIC DISTAL CLAVICLE EXCISISON, SUBACROMIAL DECOMPRESSION,BICEPS TENOTOMY,ROTATOR CUFF REPAIR ;  Surgeon: Leora Lynwood SAUNDERS, MD;  Location: [facility] ORS;  Service: Orthopedics;  Laterality: Left;   SHOULDER CLOSED REDUCTION Left 05/03/2020   Procedure: Left shoulder arthroscopic lysis of  adhesions and manipulation under anesthesia;  Surgeon: Leora Lynwood SAUNDERS, MD;  Location: [facility] ORS;  Service: Orthopedics;  Laterality: Left;   SHOULDER SURGERY  09/2008, 01/2009   Right shoulder surgery, then redone with lysis of adhesions   TOTAL HIP ARTHROPLASTY Right 05/29/2018   Procedure: TOTAL HIP ARTHROPLASTY ANTERIOR APPROACH;  Surgeon: Leora Lynwood SAUNDERS, MD;  Location: [facility] ORS;  Service: Orthopedics;  Laterality: Right;   TOTAL HIP ARTHROPLASTY Left 01/2023   UPPER GI ENDOSCOPY     Social History   Socioeconomic History   Marital status: Married    Spouse name: Not on file   Number of children: 5   Years of education: Not on file   Highest education level: High school graduate  Occupational History   Occupation: homemaker  Tobacco Use   Smoking status: Never    Passive exposure: Past   Smokeless tobacco: Never  Vaping Use   Vaping status: Never Used  Substance and Sexual Activity   Alcohol use: No   Drug use: No   Sexual activity: Not Currently  Other Topics Concern   Not on file  Social History Narrative   Home maker   Social Drivers of Health   Tobacco Use: Low Risk (10/21/2024)   Patient History    Smoking Tobacco Use: Never    Smokeless Tobacco Use: Never    Passive Exposure: Past  Financial Resource Strain: Not on file  Food Insecurity: Not on file  Transportation Needs: Not on file  Physical Activity: Not on file  Stress: Not on file  Social Connections: Not on file  Intimate Partner Violence: Not on file  Depression (EYV7-0): Medium Risk (09/30/2024)   Depression (PHQ2-9)    PHQ-2 Score: 7  Alcohol Screen: Not on file  Housing: Unknown (12/11/2023)   Received from [facility]   Epic    Unable to Pay for  Housing in the Last Year: Not on file    Number of Times Moved in the Last Year: Not on file    At any time in the past 12 months, were you homeless or living in a shelter (including now)?: No  Utilities: Not on file  Health  Literacy: Not on file   Current Outpatient Medications on File Prior to Visit  Medication Sig Dispense Refill   albuterol  (VENTOLIN  HFA) 108 (90 Base) MCG/ACT inhaler Inhale 2 puffs into the lungs every 6 (six) hours as needed for wheezing or shortness of breath. 8 g 2   aspirin -acetaminophen -caffeine  (EXCEDRIN  MIGRAINE) 250-250-65 MG tablet Take 1 tablet by mouth every 6 (six) hours as needed for headache.     azelastine  (ASTELIN ) 0.1 % nasal spray Place 2 sprays into both nostrils 2 (two) times daily. Use in each nostril as directed 30 mL 5   Blood Glucose Monitoring Suppl (ONETOUCH VERIO) w/Device KIT Use as advised 1 kit 1   cetirizine (ZYRTEC) 10 MG tablet Take 10 mg by mouth daily.      clobetasol  cream (TEMOVATE ) 0.05 % Apply 1 Application topically 2 (two) times daily. 30 g 0   Continuous Glucose Sensor (DEXCOM G7 SENSOR) MISC 3 each by Does not apply route every 30 (thirty) days. Apply 1 sensor every 10 days 9 each 4   docusate sodium  (COLACE) 100 MG capsule Take 300 mg by mouth at bedtime.      DULoxetine  (CYMBALTA ) 20 MG capsule Take 2 capsules (40 mg total) by mouth daily for 7 days, THEN 1 capsule (20 mg total) daily for 7 days. 21 capsule 0   DULoxetine  (CYMBALTA ) 60 MG capsule TAKE 1 CAPSULE BY MOUTH DAILY 90 capsule 3   Fluocinolone  Acetonide 0.01 % OIL Apply 1-2 gtts to ears once to twice daily as needed until improved. 20 mL 2   fluticasone  (FLONASE ) 50 MCG/ACT nasal spray Place 1 spray into both nostrils daily.     folic acid (FOLVITE) 1 MG tablet Take 1 mg by mouth daily.     furosemide  (LASIX ) 20 MG tablet Take 1 tablet (20 mg total) by mouth daily. 90 tablet 0   gabapentin  (NEURONTIN ) 300 MG capsule Take 1 capsule (300 mg total) by mouth 3 (three) times daily. 90 capsule 0   Glucagon  3 MG/DOSE POWD Place 3 mg into the nose once as needed for up to 1 dose. 1 each 11   glucose blood (ONETOUCH ULTRA) test strip Use as instructed 2x a day 100 strip 11    HYDROcodone -acetaminophen  (NORCO/VICODIN) 5-325 MG tablet Take 1 tablet by mouth every 4 (four) hours as needed for moderate pain (pain score 4-6). 60 tablet 0   insulin  aspart (FIASP  FLEXTOUCH) 100 UNIT/ML FlexTouch Pen Use as instructed     Insulin  Aspart, w/Niacinamide , (FIASP ) 100 UNIT/ML SOLN Use up to 250 units daily for pump. 90 mL 11   Insulin  Disposable Pump (OMNIPOD 5 DEXG7G6 INTRO GEN 5) KIT USE AS DIRECTED 1 kit 0   Insulin  Disposable Pump (OMNIPOD 5 DEXG7G6 PODS GEN 5) MISC USE 1 POD EVERY OTHER DAY 45 each 3   insulin  glargine (LANTUS  SOLOSTAR) 100 UNIT/ML Solostar Pen Inject 25 Units into the skin at bedtime. 15 mL PRN   Insulin  Infusion Pump Supplies (ILET INFUSION-INSET 23 ) MISC CHANGE DAILY 450 each 3   Insulin  Pen Needle 32G X 4 MM MISC Use 1x a day 50 each 3   JARDIANCE  10 MG TABS tablet TAKE 1 TABLET  BY MOUTH DAILY  BEFORE BREAKFAST 90 tablet 3   ketorolac  (TORADOL ) 10 MG tablet Take 1 tablet (10 mg total) by mouth every 6 (six) hours as needed. 24 tablet 0   LUMIGAN 0.01 % SOLN Place 1 drop into both eyes at bedtime.      methotrexate (RHEUMATREX) 2.5 MG tablet Take by mouth.     metoprolol  succinate (TOPROL -XL) 25 MG 24 hr tablet TAKE 1 AND 1/2 TABLETS BY MOUTH  DAILY 135 tablet 3   ondansetron  (ZOFRAN -ODT) 4 MG disintegrating tablet DISSOLVE 1 TABLET ON THE TONGUE  EVERY 6 HOURS AS NEEDED FOR  NAUSEA AND VOMITING 180 tablet 0   OneTouch Delica Lancets 33G MISC Use 2x a day 100 each 11   pantoprazole  (PROTONIX ) 40 MG tablet Take 1 tablet (40 mg total) by mouth 2 (two) times daily. 180 tablet 3   potassium chloride  SA (KLOR-CON  M) 20 MEQ tablet Take 1 tablet (20 mEq total) by mouth daily. 90 tablet 3   rosuvastatin  (CRESTOR ) 10 MG tablet TAKE 1 TABLET BY MOUTH DAILY 90 tablet 2   SKYRIZI PEN 150 MG/ML pen Inject into the skin.     traZODone  (DESYREL ) 100 MG tablet TAKE 2 TABLETS BY MOUTH EVERY  NIGHT AT BEDTIME 180 tablet 3   triamterene -hydrochlorothiazide  (MAXZIDE -25)  37.5-25 MG tablet Take 1 tablet by mouth daily. 90 tablet 1   valACYclovir  (VALTREX ) 1000 MG tablet TAKE 2 TABLETS BY MOUTH AT ONSET OF SYMPTOMS AS NEEDED FOR FEVER  BLISTERS AND TAKE 2 TABLETS BY  MOUTH 12 HOURS LATER 90 tablet 3   venlafaxine  XR (EFFEXOR -XR) 37.5 MG 24 hr capsule Take 1 capsule (37.5 mg total) by mouth daily with breakfast for 14 days. 14 capsule 0   venlafaxine  XR (EFFEXOR -XR) 75 MG 24 hr capsule Take 1 capsule (75 mg total) by mouth daily with breakfast. Start after completing 37.5 mg daily for two weeks 30 capsule 0   No current facility-administered medications on file prior to visit.   Allergies  Allergen Reactions   Humalog [Insulin  Lispro] Swelling   Lisinopril Cough   Metformin  Other (See Comments)   Nitrofurantoin Other (See Comments)    Pt unsure   Wellbutrin  [Bupropion ] Other (See Comments)    Aggression    Atorvastatin  Diarrhea    May have caused colitis--but not clear cut   Clarithromycin Nausea Only   Glipizide  Other (See Comments)    Sugars too variable   Invokana  [Canagliflozin ]     Nausea, constipation   Oxycodone -Acetaminophen  Itching and Nausea Only    Can tolerate norco   Family History  Problem Relation Age of Onset   Diabetes Mother    Heart disease Mother    Hypertension Mother    Ovarian cancer Mother    Melanoma Mother    Cancer Father    Coronary artery disease Father    Heart disease Father    Diabetes Sister    Depression Sister    Coronary artery disease Sister    Heart disease Sister    Pulmonary Hypertension Sister    Rheum arthritis Maternal Aunt    Bipolar disorder Paternal Uncle    Heart attack Paternal Uncle    Heart attack Paternal Uncle    Diabetes Maternal Grandfather    Breast cancer Maternal Grandmother    Diabetes Maternal Grandmother    Heart attack Cousin    Colon cancer Neg Hx    PE: BP 120/60   Pulse 92   Ht 5' 4 (1.626  m)   Wt 175 lb 3.2 oz (79.5 kg)   SpO2 94%   BMI 30.07 kg/m  Wt Readings  from Last 10 Encounters:  11/17/24 175 lb 3.2 oz (79.5 kg)  10/21/24 175 lb 7.2 oz (79.6 kg)  10/14/24 174 lb (78.9 kg)  10/12/24 174 lb (78.9 kg)  09/30/24 176 lb 6.4 oz (80 kg)  09/23/24 175 lb 6.4 oz (79.6 kg)  09/17/24 174 lb (78.9 kg)  08/21/24 178 lb 12.8 oz (81.1 kg)  07/14/24 178 lb 3.2 oz (80.8 kg)  06/02/24 175 lb 8 oz (79.6 kg)   Constitutional: normal weight, in NAD Eyes: EOMI, no exophthalmos ENT: no thyromegaly, no cervical lymphadenopathy Cardiovascular: tachycardia, RR, No MRG Respiratory: CTA B Musculoskeletal: no deformities Skin: no rashes Neurological: +tremor with outstretched hands  ASSESSMENT: 1. DM2, insulin -dependent, uncontrolled, without long-term complications, but with significant hyperglycemia  2. HL  3. Obesity class 1  4.   PLAN:  1. Patient with history of poorly controlled type 2 diabetes, insulin  resistant, previously on basal/bolus insulin  regimen, and then on the OmniPod 5 insulin  pump, but currently only iLet insulin  pump started since last visit.  This is integrated with the Dexcom CGM and using U-500 insulin  in the pump.  At last visit, HbA1c improved from 8.1 to 7.2%.  However, sugars from her sensor predicted a higher HbA1c, at 7.7%.  -At last visit, sugars were not much changed from previously when reviewing the CGM traces, improving in the second half of the night and early morning and then increasing significantly after meals, with the highest peak being around 2 PM and around 1 AM.  She was mostly entering carbs into the pump before breakfast, much less frequently before lunch and almost never before dinner, which was likely the cause for her blood sugars increasing significantly after lunch and dinner.  At that time, I suggested the iLet pump for which she only had to announce the meals, not entering any carbs.  She is doing a good job announcing most of her meals but the sugars are still increasing after some of her meals despite  announcing larger meals.  At today's visit we discussed about trying to lower her target for blood sugars. CGM interpretation: -At today's visit, we reviewed her CGM downloads: It appears that 57% of values are in target range (goal >70%), while 43% are higher than 180 (goal <25%), and 0% are lower than 70 (goal <4%).  The calculated average blood sugar is 173.  The projected HbA1c for the next 3 months (GMI) is 7.4%. -Reviewing the CGM trends, sugars appear to be fluctuating within the target range except after lunch and dinner when sugars are higher.  Sugars are the highest overnight.  They improve around 6 AM.  Upon questioning, approximately a month ago she had to receive high-dose steroid injections in her back due to significant back and sciatica pain, which even prompted an ED visit.  Her back pain resolved almost completely on the right side of her body but she still has pain on the left side of her body, although less than before.  Sugars have been staying elevated ever since the injections.  She is trying to reduce her fluid intake to help with her blood sugars.  She is doing a good job announcing the meals and she usually announces larger meals.  She uses the lowest CBG target and a slightly higher weight is entering the pump (179 compared to 175 obtained today).  I am  not sure how else we can adjust the pump settings so that the algorithm is giving her more insulin .  I will discuss with diabetes education.  For now, we discussed about continuing the same regimen and I also detailed a backup plan for her in case her pump fails. -I suggested: Patient Instructions  Please continue the iLet pump.    Try your best to announce all meals.  Back up plan (off the pump): - Lantus  35 units at bedtime - FiAsp   7-8 units before b'fast 9-10 units before lunch 11-12 units before dinner   Also, continue: - Jardiance  10 mg before b'fast.   Please return in 3-4 months.  - we checked her HbA1c: 7.4%  (higher) - advised to check sugars at different times of the day - 4x a day, rotating check times - advised for yearly eye exams >> she is UTD - return to clinic in 3-4 months  2. HL - Latest lipid panel showed all fractions at goal 4 months ago: Lab Results  Component Value Date   CHOL 141 09/30/2024   HDL 54.50 09/30/2024   LDLCALC 56 09/30/2024   LDLDIRECT 116.0 03/12/2023   TRIG 153.0 (H) 09/30/2024   CHOLHDL 3 09/30/2024  -She continues Crestor  5 mg daily without side effects  3. Obesity class 1 - She previously inquired about a medication that may help with weight loss.  Unfortunately, she was not able to tolerate even oral GLP-1 receptor agonist and we already have her on an SGLT2 inhibitor. - At last visit she agreed with a referral to the weight management clinic.  She went for a weigh-in but her weight was slightly under their thresholds.  At today's visit, her BMI is still above 30.  4.  Thyroid  nodule -I reviewed patient's recent thyroid  ultrasound results along with the patient.  The thyroid  gland is not enlarged and it contains a 1 nodule measuring 1 cm in the largest dimension.  This is isoechoic and spongiform, pointing towards benignity.  No need to follow this by imaging but we will continue to monitor her clinically. -She was worried about her heat intolerance being related to the thyroid  but I explained that this nodule is not causing any symptoms as her thyroid  function is normal.   Lela Fendt, MD PhD [facility] Endocrinology

## 2024-11-18 ENCOUNTER — Ambulatory Visit: Admitting: Psychiatry

## 2024-11-18 ENCOUNTER — Encounter: Payer: Self-pay | Admitting: Physician Assistant

## 2024-11-19 ENCOUNTER — Other Ambulatory Visit: Payer: Self-pay | Admitting: Family

## 2024-11-21 ENCOUNTER — Other Ambulatory Visit: Payer: Self-pay | Admitting: *Deleted

## 2024-11-21 DIAGNOSIS — Z79899 Other long term (current) drug therapy: Secondary | ICD-10-CM

## 2024-11-21 DIAGNOSIS — R000 Tachycardia, unspecified: Secondary | ICD-10-CM

## 2024-11-21 MED ORDER — PANTOPRAZOLE SODIUM 40 MG PO TBEC: 40.0000 mg | DELAYED_RELEASE_TABLET | Freq: Two times a day (BID) | ORAL | 3 refills | Status: AC

## 2024-11-21 MED ORDER — DULOXETINE HCL 60 MG PO CPEP: 60.0000 mg | ORAL_CAPSULE | Freq: Every day | ORAL | 3 refills | Status: AC

## 2024-11-24 ENCOUNTER — Ambulatory Visit
Admission: RE | Admit: 2024-11-24 | Discharge: 2024-11-24 | Disposition: A | Discharge status: Home / Self Care | Source: Ambulatory Visit | Attending: Family | Admitting: Family

## 2024-11-24 DIAGNOSIS — Z780 Asymptomatic menopausal state: Secondary | ICD-10-CM | POA: Insufficient documentation

## 2024-11-24 DIAGNOSIS — Z1231 Encounter for screening mammogram for malignant neoplasm of breast: Secondary | ICD-10-CM | POA: Insufficient documentation

## 2024-11-24 MED ORDER — METOPROLOL SUCCINATE ER 25 MG PO TB24: 37.5000 mg | ORAL_TABLET | Freq: Every day | ORAL | 1 refills | Status: AC

## 2024-11-24 MED ORDER — POTASSIUM CHLORIDE CRYS ER 20 MEQ PO TBCR: 20.0000 meq | EXTENDED_RELEASE_TABLET | Freq: Every day | ORAL | 3 refills | Status: AC

## 2024-11-26 ENCOUNTER — Encounter: Payer: Self-pay | Admitting: Nurse Practitioner

## 2024-11-26 ENCOUNTER — Ambulatory Visit (INDEPENDENT_AMBULATORY_CARE_PROVIDER_SITE_OTHER): Payer: Self-pay

## 2024-11-26 ENCOUNTER — Ambulatory Visit (INDEPENDENT_AMBULATORY_CARE_PROVIDER_SITE_OTHER): Payer: Self-pay | Admitting: Nurse Practitioner

## 2024-11-26 VITALS — BP 124/80 | HR 72 | Temp 98.7°F | Ht 64.0 in | Wt 172.6 lb

## 2024-11-26 DIAGNOSIS — J4520 Mild intermittent asthma, uncomplicated: Secondary | ICD-10-CM

## 2024-11-26 DIAGNOSIS — R0602 Shortness of breath: Secondary | ICD-10-CM

## 2024-11-26 DIAGNOSIS — J310 Chronic rhinitis: Secondary | ICD-10-CM

## 2024-11-26 DIAGNOSIS — G4733 Obstructive sleep apnea (adult) (pediatric): Secondary | ICD-10-CM

## 2024-11-26 DIAGNOSIS — J45909 Unspecified asthma, uncomplicated: Secondary | ICD-10-CM

## 2024-11-26 DIAGNOSIS — R0609 Other forms of dyspnea: Secondary | ICD-10-CM

## 2024-11-26 LAB — PULMONARY FUNCTION TEST
DL/VA % pred: 113 %
DL/VA: 4.74 ml/min/mmHg/L
DLCO cor % pred: 114 %
DLCO cor: 22.49 ml/min/mmHg
DLCO unc % pred: 116 %
DLCO unc: 22.69 ml/min/mmHg
FEF 25-75 Post: 2.15 L/s
FEF 25-75 Pre: 2.1 L/s
FEF2575-%Change-Post: 2 %
FEF2575-%Pred-Post: 103 %
FEF2575-%Pred-Pre: 100 %
FEV1-%Change-Post: 1 %
FEV1-%Pred-Post: 97 %
FEV1-%Pred-Pre: 95 %
FEV1-Post: 2.32 L
FEV1-Pre: 2.28 L
FEV1FVC-%Change-Post: 1 %
FEV1FVC-%Pred-Pre: 102 %
FEV6-%Change-Post: 0 %
FEV6-%Pred-Post: 96 %
FEV6-%Pred-Pre: 95 %
FEV6-Post: 2.89 L
FEV6-Pre: 2.88 L
FEV6FVC-%Change-Post: 0 %
FEV6FVC-%Pred-Post: 103 %
FEV6FVC-%Pred-Pre: 104 %
FVC-%Change-Post: 0 %
FVC-%Pred-Post: 92 %
FVC-%Pred-Pre: 92 %
FVC-Post: 2.9 L
FVC-Pre: 2.9 L
Post FEV1/FVC ratio: 80 %
Post FEV6/FVC ratio: 100 %
Pre FEV1/FVC ratio: 79 %
Pre FEV6/FVC Ratio: 100 %
RV % pred: 94 %
RV: 1.99 L
TLC % pred: 97 %
TLC: 4.91 L

## 2024-11-26 NOTE — Patient Instructions (Addendum)
 Trial Albuterol  inhaler 2 puffs every 6 hours as needed for shortness of breath or wheezing Continue flonase  nasal spray 2 sprays each nostril daily Continue over the counter cold and flu medication  Continue cetirizine daily Continue Azelastine  nasal spray 2 sprays each nostril Twice daily for nasal congestion/drainage   Cardiopulmonary exercise stress testing - someone will contact you to schedule this     Continue to use CPAP every night, minimum of 4-6 hours a night.  Change equipment as directed. Wash your tubing with warm soap and water daily, hang to dry. Wash humidifier portion weekly. Use bottled, distilled water and change daily Be aware of reduced alertness and do not drive or operate heavy machinery if experiencing this or drowsiness.  Exercise encouraged, as tolerated. Healthy weight management discussed.  Avoid or decrease alcohol consumption and medications that make you more sleepy, if possible. Notify if persistent daytime sleepiness occurs even with consistent use of PAP therapy.   Change CPAP supplies... Every month Mask cushions and/or nasal pillows CPAP machine filters Every 3 months Mask frame (not including the headgear) CPAP tubing Every 6 months Mask headgear Chin strap (if applicable) Humidifier water tub   Try the new CPAP mask (AirTouch F30i in size medium)  If you cannot tolerate this mask or it doesn't help, we can try decreasing your pressure settings. Just let us  know    Follow up in 6 weeks after exercise test with any new pulmonologist (30 min slot) to establish care. Follow up with Dr. Jess for OSA on CPAP in 6 months. If symptoms do not improve or worsen, please contact office for sooner follow up or seek emergency care.

## 2024-11-26 NOTE — Progress Notes (Signed)
 "  @Patient  ID: Shannon [name], female    DOB: 1956/06/26, 68 y.o.   MRN: [id number]  Chief Complaint  Patient presents with   Shortness of Breath    DOE. No wheezing or cough. PFT results. Albuterol - PRN    Referring provider: Jimmy Charlie FERNS, MD  HPI: 69 year old female, never smoker followed for OSA on CPAP. She was last seen by Vishal Sandlin,NP 09/17/2024. Past medical history significant for mitral valve disease, HTN, GERD, DM, HLD, asthma.   TEST/EVENTS:  01/12/2023 HST: AHI 56/h, SpO2 low 77% 04/10/2024 CT cardiac: lungs clear aside from some linear scarring in b/l lower lobes that is stable 09/17/2024 FeNO 5 ppb 09/17/2024 allergen panel negative  11/26/2024 PFT: FVC 92, FEV1 95, ratio 80, TLC 97, DLCOcor 114. No BD  10/30/2023: OV with Parrett,NP. Pt started on CPAP Therapy for severe OSA. Since last visit, doing well. Feels she was benefiting from CPAP with decreased sleepiness. Has been struggling with a sinus infection. Currently on Avelox  with 2-3 days left. Continues to have right sided sinus pressure, ear fullness and discharge. Recommended to see ENT. Advised to begin sinus rinses. Encouraged to increase consistently with CPAP after sinus symptoms resolve.   09/17/2024: OV with Gershom Brobeck NP Shannon [name] is a 69 year old female with sleep apnea and asthma who presents with concerns about her breathing.  She has a history of sleep apnea and uses a CPAP machine most nights. Recently, she experienced a break in usage due to issues with a sinus infection, which exacerbated her cough and caused nasal congestion. She has not been sleeping well during this period. She otherwise gets benefit from using CPAP. Improves daytime fatigue. No drowsy driving.  She has a history of asthma diagnosed in her late twenties, but she has not been on medication for it recently. She experiences increased shortness of breath and a racing heart rate when exerting herself. This predated the current cold symptoms.  She experiences chest pressure occasionally with the symptoms. She had a recent cardiac monitor that was overall benign.  She has a cough that she attributes to recent illness, with postnasal drainage being a significant issue. She uses over-the-counter cold and flu medication and Flonase  (fluticasone ) for symptom relief. She describes sinus tenderness and nasal discharge that started as clear and progressed to yellow and green over the past week. She has not used antibiotics recently. She denies any fevers, chills.  She has a history of mitral valve disease and some CAD, which is being monitored. She reports episodes of elevated heart rate, with her watch indicating a heart rate over 120 for about two and a half hours last night. No recent travel, coughing up blood, calf pain/swelling. Her oxygen levels have been stable, typically in the mid-nineties. No CP, palpitations, syncope, lightheadedness.     11/26/2024:Today - follow up Discussed the use of AI scribe software for clinical note transcription with the patient, who gave verbal consent to proceed.  History of Present Illness  Shannon [name] is a 69 year old female who presents for follow up. She had PFT today that was normal.   She feels unchanged compared to her last visit. Has shortness of breath with exertion. She does have a history of asthma, diagnosed in her 74's. She has not used albuterol  yet, which was previously prescribed. She mentions having had COVID right after Christmas, but is feeling better since then. Cough is mostly resolved since last OV. No wheezing. No hemoptysis, fevers,  chills, leg swelling, orthopnea, CP.   She uses a CPAP machine but struggles with sinus issues, which are exacerbated by the nasal mask. Within thirty minutes of using the CPAP, her nose becomes 'stopped up.' On nights she uses the CPAP, she rests better, but on nights she doesn't, she feels tired and sleepy. No drowsy driving.   She has a history of  sinus issues and was treated with antibiotics during her last visit. Her sinuses have felt better since then. She has been using nasal sprays off and on, specifically Flonase  and azelastine , but not consistently.    Allergies  Allergen Reactions   Humalog [Insulin  Lispro] Swelling   Lisinopril Cough   Metformin  Other (See Comments)   Nitrofurantoin Other (See Comments)    Pt unsure   Wellbutrin  [Bupropion ] Other (See Comments)    Aggression    Atorvastatin  Diarrhea    May have caused colitis--but not clear cut   Clarithromycin Nausea Only   Glipizide  Other (See Comments)    Sugars too variable   Invokana  [Canagliflozin ]     Nausea, constipation   Oxycodone -Acetaminophen  Itching and Nausea Only    Can tolerate norco    Immunization History  Administered Date(s) Administered   INFLUENZA, HIGH DOSE SEASONAL PF 09/30/2024   Influenza Split 10/19/2011, 09/16/2012   Influenza Whole 09/23/2007, 08/28/2008, 09/09/2009, 09/05/2010   Influenza, Seasonal, Injecte, Preservative Fre 07/26/2023   Influenza,inj,Quad PF,6+ Mos 09/22/2014, 08/09/2015, 11/16/2016, 09/03/2018, 09/09/2019, 10/05/2022   Influenza-Unspecified 09/04/2017, 09/03/2020, 10/11/2021   Moderna Sars-Covid-2 Vaccination 08/20/2020, 09/17/2020   Pneumococcal Conjugate-13 06/18/2017   Pneumococcal Polysaccharide-23 09/16/2012, 06/07/2021   Td 11/20/2004   Tdap 05/10/2015   Zoster Recombinant(Shingrix ) 12/17/2018, 04/15/2019    Past Medical History:  Diagnosis Date   Actinic keratosis    Allergy    Arthritis    hip, back, shoulders, joints   Asthma    Complication of anesthesia    problems with block during breast augmentation and epidural during childbirth   Depression    Diabetes mellitus 09/2011   type 2   Family history of adverse reaction to anesthesia    PROBLEMS WITH BLOCKS   Fatty liver    Fibromyalgia    GERD (gastroesophageal reflux disease)    Glaucoma suspect    Headache    migraines-4x a week  headache-1x week migraine   History of hiatal hernia    Hyperlipidemia    Hypertension    Controlled on meds   Mitral valve prolapse    Nose colonized with MRSA 05/15/2018   Osteopenia    Pneumonia    PONV (postoperative nausea and vomiting)    Psoriasis    Shingles 11/2006   Sleep disturbance    Tachycardia    Vertigo    Hx of    Tobacco History: Social History   Tobacco Use  Smoking Status Never   Passive exposure: Past  Smokeless Tobacco Never   Counseling given: Not Answered   Outpatient Medications Prior to Visit  Medication Sig Dispense Refill   albuterol  (VENTOLIN  HFA) 108 (90 Base) MCG/ACT inhaler Inhale 2 puffs into the lungs every 6 (six) hours as needed for wheezing or shortness of breath. 8 g 2   aspirin -acetaminophen -caffeine  (EXCEDRIN  MIGRAINE) 250-250-65 MG tablet Take 1 tablet by mouth every 6 (six) hours as needed for headache.     azelastine  (ASTELIN ) 0.1 % nasal spray Place 2 sprays into both nostrils 2 (two) times daily. Use in each nostril as directed 30 mL 5   Blood  Glucose Monitoring Suppl (ONETOUCH VERIO) w/Device KIT Use as advised 1 kit 1   cetirizine (ZYRTEC) 10 MG tablet Take 10 mg by mouth daily.      clobetasol  cream (TEMOVATE ) 0.05 % Apply 1 Application topically 2 (two) times daily. 30 g 0   Continuous Glucose Sensor (DEXCOM G7 SENSOR) MISC 3 each by Does not apply route every 30 (thirty) days. Apply 1 sensor every 10 days 9 each 4   docusate sodium  (COLACE) 100 MG capsule Take 300 mg by mouth at bedtime.      DULoxetine  (CYMBALTA ) 20 MG capsule Take 2 capsules (40 mg total) by mouth daily for 7 days, THEN 1 capsule (20 mg total) daily for 7 days. 21 capsule 0   DULoxetine  (CYMBALTA ) 60 MG capsule Take 1 capsule (60 mg total) by mouth daily. 90 capsule 3   Fluocinolone  Acetonide 0.01 % OIL Apply 1-2 gtts to ears once to twice daily as needed until improved. 20 mL 2   fluticasone  (FLONASE ) 50 MCG/ACT nasal spray Place 1 spray into both nostrils  daily.     folic acid (FOLVITE) 1 MG tablet Take 1 mg by mouth daily.     furosemide  (LASIX ) 20 MG tablet TAKE 1 TABLET BY MOUTH DAILY 90 tablet 3   gabapentin  (NEURONTIN ) 300 MG capsule Take 1 capsule (300 mg total) by mouth 3 (three) times daily. 90 capsule 0   Glucagon  3 MG/DOSE POWD Place 3 mg into the nose once as needed for up to 1 dose. 1 each 11   glucose blood (ONETOUCH ULTRA) test strip Use as instructed 2x a day 100 strip 11   HYDROcodone -acetaminophen  (NORCO/VICODIN) 5-325 MG tablet Take 1 tablet by mouth every 4 (four) hours as needed for moderate pain (pain score 4-6). 60 tablet 0   Insulin  Aspart, w/Niacinamide , (FIASP ) 100 UNIT/ML SOLN Use up to 250 units daily for pump. 90 mL 11   insulin  glargine (LANTUS  SOLOSTAR) 100 UNIT/ML Solostar Pen Inject 35 Units into the skin at bedtime.     Insulin  Infusion Pump Supplies (ILET INFUSION-INSET 23 ) MISC CHANGE DAILY 450 each 3   Insulin  Pen Needle 32G X 4 MM MISC Use 1x a day 50 each 3   JARDIANCE  10 MG TABS tablet TAKE 1 TABLET BY MOUTH DAILY  BEFORE BREAKFAST 90 tablet 3   ketorolac  (TORADOL ) 10 MG tablet Take 1 tablet (10 mg total) by mouth every 6 (six) hours as needed. 24 tablet 0   LUMIGAN 0.01 % SOLN Place 1 drop into both eyes at bedtime.      methotrexate (RHEUMATREX) 2.5 MG tablet Take by mouth.     metoprolol  succinate (TOPROL -XL) 25 MG 24 hr tablet Take 1.5 tablets (37.5 mg total) by mouth daily. 135 tablet 1   ondansetron  (ZOFRAN -ODT) 4 MG disintegrating tablet DISSOLVE 1 TABLET ON THE TONGUE  EVERY 6 HOURS AS NEEDED FOR  NAUSEA AND VOMITING 180 tablet 0   OneTouch Delica Lancets 33G MISC Use 2x a day 100 each 11   pantoprazole  (PROTONIX ) 40 MG tablet Take 1 tablet (40 mg total) by mouth 2 (two) times daily. 180 tablet 3   potassium chloride  SA (KLOR-CON  M) 20 MEQ tablet Take 1 tablet (20 mEq total) by mouth daily. 90 tablet 3   rosuvastatin  (CRESTOR ) 10 MG tablet TAKE 1 TABLET BY MOUTH DAILY 90 tablet 2   SKYRIZI PEN  150 MG/ML pen Inject into the skin.     traZODone  (DESYREL ) 100 MG tablet TAKE 2 TABLETS  BY MOUTH EVERY  NIGHT AT BEDTIME 180 tablet 3   triamterene -hydrochlorothiazide  (MAXZIDE -25) 37.5-25 MG tablet Take 1 tablet by mouth daily. 90 tablet 1   valACYclovir  (VALTREX ) 1000 MG tablet TAKE 2 TABLETS BY MOUTH AT ONSET OF SYMPTOMS AS NEEDED FOR FEVER  BLISTERS AND TAKE 2 TABLETS BY  MOUTH 12 HOURS LATER 90 tablet 3   venlafaxine  XR (EFFEXOR -XR) 37.5 MG 24 hr capsule Take 1 capsule (37.5 mg total) by mouth daily with breakfast for 14 days. 14 capsule 0   venlafaxine  XR (EFFEXOR -XR) 75 MG 24 hr capsule Take 1 capsule (75 mg total) by mouth daily with breakfast. Start after completing 37.5 mg daily for two weeks 30 capsule 0   No facility-administered medications prior to visit.     Review of Systems: as above    Physical Exam:  BP 124/80   Pulse 72   Temp 98.7 F (37.1 C)   Ht 5' 4 (1.626 m)   Wt 172 lb 9.6 oz (78.3 kg)   SpO2 97%   BMI 29.63 kg/m   GEN: Pleasant, interactive, well-kempt; in no acute distress HEENT:  Normocephalic and atraumatic. PERRLA. Sclera white. Nasal turbinates pink, moist and patent bilaterally. No rhinorrhea present. Oropharynx pink and moist, without exudate or edema. No lesions, ulcerations. No sinus tenderness  NECK:  Supple w/ fair ROM. No lymphadenopathy.   CV: RRR, no m/r/g, no peripheral edema. Pulses intact, +2 bilaterally. No cyanosis, pallor or clubbing. PULMONARY:  Unlabored, regular breathing. Clear bilaterally A&P w/o wheezes/rales/rhonchi. No accessory muscle use.  GI: BS present and normoactive. Soft, non-tender to palpation.  MSK: No erythema, warmth or tenderness.  Neuro: A/Ox3. No focal deficits noted.   Skin: Warm, no lesions or rashe Psych: Normal affect and behavior. Judgement and thought content appropriate.     Lab Results:  CBC    Component Value Date/Time   WBC 7.2 09/30/2024 1218   RBC 4.88 09/30/2024 1218   HGB 13.7  09/30/2024 1218   HGB 13.2 12/14/2016 1129   HCT 42.4 09/30/2024 1218   HCT 39.3 12/14/2016 1129   PLT 253.0 09/30/2024 1218   PLT 289 12/14/2016 1129   MCV 86.9 09/30/2024 1218   MCV 85 12/14/2016 1129   MCV 86 03/07/2014 1107   MCH 27.4 10/25/2023 1701   MCHC 32.3 09/30/2024 1218   RDW 18.9 (H) 09/30/2024 1218   RDW 14.0 12/14/2016 1129   RDW 13.0 03/07/2014 1107   LYMPHSABS 2.5 09/15/2021 1759   LYMPHSABS 2.6 12/14/2016 1129   MONOABS 1.1 (H) 09/15/2021 1759   EOSABS 0.2 09/15/2021 1759   EOSABS 0.3 12/14/2016 1129   BASOSABS 0.0 09/15/2021 1759   BASOSABS 0.0 12/14/2016 1129    BMET    Component Value Date/Time   NA 142 09/30/2024 1218   NA 144 03/17/2024 1436   NA 138 03/07/2014 1107   K 3.9 09/30/2024 1218   K 3.5 03/07/2014 1107   CL 100 09/30/2024 1218   CL 103 03/07/2014 1107   CO2 33 (H) 09/30/2024 1218   CO2 26 03/07/2014 1107   GLUCOSE 93 09/30/2024 1218   GLUCOSE 150 (H) 03/07/2014 1107   BUN 17 09/30/2024 1218   BUN 18 03/17/2024 1436   BUN 12 03/07/2014 1107   CREATININE 0.88 09/30/2024 1218   CREATININE 1.08 (H) 03/03/2021 1540   CALCIUM  9.9 09/30/2024 1218   CALCIUM  10.6 (H) 03/07/2014 1107   GFRNONAA >60 10/25/2023 1701   GFRNONAA 55 (L) 03/03/2021 1540   GFRAA  63 03/03/2021 1540    BNP No results found for: BNP   Imaging:  MM 3D SCREENING MAMMOGRAM BILATERAL BREAST W/IMPLANT Result Date: 11/26/2024 CLINICAL DATA:  Screening. EXAM: DIGITAL SCREENING BILATERAL MAMMOGRAM WITH IMPLANTS, CAD AND TOMOSYNTHESIS TECHNIQUE: Bilateral screening digital craniocaudal and mediolateral oblique mammograms were obtained. Bilateral screening digital breast tomosynthesis was performed. The images were evaluated with computer-aided detection. Standard and/or implant displaced views were performed. COMPARISON:  Previous exam(s). ACR Breast Density Category b: There are scattered areas of fibroglandular density. FINDINGS: The patient has retroglandular implants.  There are no findings suspicious for malignancy. IMPRESSION: No mammographic evidence of malignancy. A result letter of this screening mammogram will be mailed directly to the patient. RECOMMENDATION: Screening mammogram in one year. (Code:[id number]-B-01Y) BI-RADS CATEGORY  1: Negative. Electronically Signed   By: Alm Parkins M.D.   On: 11/26/2024 10:25   DG Bone Density Result Date: 11/24/2024 EXAM: DUAL X-RAY ABSORPTIOMETRY (DXA) FOR BONE MINERAL DENSITY 11/24/2024 11:18 am CLINICAL DATA:  69 year old Female Postmenopausal. Postmenopausal Patient is or has been on glucocorticoid therapy. TECHNIQUE: An axial (e.g., hips, spine) and/or appendicular (e.g., radius) exam was performed, as appropriate, using GE Secretary/administrator at [facility]. Images are obtained for bone mineral density measurement and are not obtained for diagnostic purposes. [id number] Exclusions: Bilateral hip due to surgical hardware. COMPARISON:  None. FINDINGS: Scan quality: Good. LUMBAR SPINE (L1-L4): BMD (in g/cm2): 0.998 T-score: -1.6 Z-score: 0.0 LEFT FOREARM (RADIUS 33%): BMD (in g/cm2): 0.698 T-score: -2.0 Z-score: -0.5 FRAX 10-YEAR PROBABILITY OF FRACTURE: FRAX not reported as the lowest BMD is not in the osteopenia range. IMPRESSION: Osteopenia based on BMD. Fracture risk is increased. Increased risk is based on low BMD. RECOMMENDATIONS: 1. All patients should optimize calcium  and vitamin D intake. 2. Consider FDA-approved medical therapies in postmenopausal women and men aged 16 years and older, based on the following: - A hip or vertebral (clinical or morphometric) fracture - T-score less than or equal to -2.5 and secondary causes have been excluded. - Low bone mass (T-score between -1.0 and -2.5) and a 10-year probability of a hip fracture greater than or equal to 3% or a 10-year probability of a major osteoporosis-related fracture greater than or equal to 20% based on the [place] -adapted WHO algorithm. - Clinician  judgment and/or patient preferences may indicate treatment for people with 10-year fracture probabilities above or below these levels 3. Patients with diagnosis of osteoporosis or at high risk for fracture should have regular bone mineral density tests. For patients eligible for Medicare, routine testing is allowed once every 2 years. The testing frequency can be increased to one year for patients who have rapidly progressing disease, those who are receiving or discontinuing medical therapy to restore bone mass, or have additional risk factors. Electronically Signed   By: Harrietta Sherry M.D.   On: 11/24/2024 15:53    Administration History     None          Latest Ref Rng & Units 11/26/2024    9:29 AM  PFT Results  FVC-Pre L 2.90  P  FVC-Predicted Pre % 92  P  FVC-Post L 2.90  P  FVC-Predicted Post % 92  P  Pre FEV1/FVC % % 79  P  Post FEV1/FCV % % 80  P  FEV1-Pre L 2.28  P  FEV1-Predicted Pre % 95  P  FEV1-Post L 2.32  P  DLCO uncorrected ml/min/mmHg 22.69  P  DLCO UNC% % 116  P  DLCO corrected ml/min/mmHg 22.49  P  DLCO COR %Predicted % 114  P  DLVA Predicted % 113  P  TLC L 4.91  P  TLC % Predicted % 97  P  RV % Predicted % 94  P    P Preliminary result    Lab Results  Component Value Date   NITRICOXIDE 5 09/17/2024        Assessment & Plan:   OSA (obstructive sleep apnea) Severe OSA, on CPAP. Inconsistent use due to sinus issues. Suspect nasal mask is worsening rhinitis. Will trial her on hybrid FFM and reassess response. Provided with AirTouchF30i in size medium in office. Encouraged to use intranasal steroid more consistently. If no improvement, can make pressure adjustments. Reviewed risks of untreated severe OSA and encouraged nightly use. Receives benefit from use. Aware of proper care/use of device. Safe driving practices reviewed. Healthy weight management encouraged.  Patient Instructions  Trial Albuterol  inhaler 2 puffs every 6 hours as needed for  shortness of breath or wheezing Continue flonase  nasal spray 2 sprays each nostril daily Continue over the counter cold and flu medication  Continue cetirizine daily Continue Azelastine  nasal spray 2 sprays each nostril Twice daily for nasal congestion/drainage   Cardiopulmonary exercise stress testing - someone will contact you to schedule this     Continue to use CPAP every night, minimum of 4-6 hours a night.  Change equipment as directed. Wash your tubing with warm soap and water daily, hang to dry. Wash humidifier portion weekly. Use bottled, distilled water and change daily Be aware of reduced alertness and do not drive or operate heavy machinery if experiencing this or drowsiness.  Exercise encouraged, as tolerated. Healthy weight management discussed.  Avoid or decrease alcohol consumption and medications that make you more sleepy, if possible. Notify if persistent daytime sleepiness occurs even with consistent use of PAP therapy.   Change CPAP supplies... Every month Mask cushions and/or nasal pillows CPAP machine filters Every 3 months Mask frame (not including the headgear) CPAP tubing Every 6 months Mask headgear Chin strap (if applicable) Humidifier water tub   Try the new CPAP mask (AirTouch F30i in size medium)  If you cannot tolerate this mask or it doesn't help, we can try decreasing your pressure settings. Just let us  know    Follow up in 6 weeks after exercise test with any new pulmonologist (30 min slot) to establish care. Follow up with Dr. Jess for OSA on CPAP in 6 months. If symptoms do not improve or worsen, please contact office for sooner follow up or seek emergency care.    DOE (dyspnea on exertion) Chronic DOE. No acute worsening. Hx of asthma. Never smoker. She does have psoriatic arthritis. No evidence of CT ILD on prior imaging from earlier this year. Advised to trial albuterol  and reassess response. PFT unremarkable. Will obtain cardiopulmonary  exercise test, given associated tachycardia.   Chronic rhinitis Exacerbated by CPAP. See above plan. Continue nasal sprays   Asthma See above. Does not seem asthma driven. No significant peripheral eosinophilia.     Advised if symptoms do not improve or worsen, to please contact office for sooner follow up or seek emergency care.   I spent 35 minutes of dedicated to the care of this patient on the date of this encounter to include pre-visit review of records, face-to-face time with the patient discussing conditions above, post visit ordering of testing, clinical documentation with the electronic health record, making appropriate referrals as  documented, and communicating necessary findings to members of the patients care team.  Comer LULLA Rouleau, NP 11/26/2024  Pt aware and understands NP's role.   "

## 2024-11-26 NOTE — Assessment & Plan Note (Signed)
 Chronic DOE. No acute worsening. Hx of asthma. Never smoker. She does have psoriatic arthritis. No evidence of CT ILD on prior imaging from earlier this year. Advised to trial albuterol  and reassess response. PFT unremarkable. Will obtain cardiopulmonary exercise test, given associated tachycardia.

## 2024-11-26 NOTE — Assessment & Plan Note (Signed)
 Severe OSA, on CPAP. Inconsistent use due to sinus issues. Suspect nasal mask is worsening rhinitis. Will trial her on hybrid FFM and reassess response. Provided with AirTouchF30i in size medium in office. Encouraged to use intranasal steroid more consistently. If no improvement, can make pressure adjustments. Reviewed risks of untreated severe OSA and encouraged nightly use. Receives benefit from use. Aware of proper care/use of device. Safe driving practices reviewed. Healthy weight management encouraged.  Patient Instructions  Trial Albuterol  inhaler 2 puffs every 6 hours as needed for shortness of breath or wheezing Continue flonase  nasal spray 2 sprays each nostril daily Continue over the counter cold and flu medication  Continue cetirizine daily Continue Azelastine  nasal spray 2 sprays each nostril Twice daily for nasal congestion/drainage   Cardiopulmonary exercise stress testing - someone will contact you to schedule this     Continue to use CPAP every night, minimum of 4-6 hours a night.  Change equipment as directed. Wash your tubing with warm soap and water daily, hang to dry. Wash humidifier portion weekly. Use bottled, distilled water and change daily Be aware of reduced alertness and do not drive or operate heavy machinery if experiencing this or drowsiness.  Exercise encouraged, as tolerated. Healthy weight management discussed.  Avoid or decrease alcohol consumption and medications that make you more sleepy, if possible. Notify if persistent daytime sleepiness occurs even with consistent use of PAP therapy.   Change CPAP supplies... Every month Mask cushions and/or nasal pillows CPAP machine filters Every 3 months Mask frame (not including the headgear) CPAP tubing Every 6 months Mask headgear Chin strap (if applicable) Humidifier water tub   Try the new CPAP mask (AirTouch F30i in size medium)  If you cannot tolerate this mask or it doesn't help, we can try decreasing  your pressure settings. Just let us  know    Follow up in 6 weeks after exercise test with any new pulmonologist (30 min slot) to establish care. Follow up with Dr. Jess for OSA on CPAP in 6 months. If symptoms do not improve or worsen, please contact office for sooner follow up or seek emergency care.

## 2024-11-26 NOTE — Assessment & Plan Note (Signed)
 See above. Does not seem asthma driven. No significant peripheral eosinophilia.

## 2024-11-26 NOTE — Patient Instructions (Signed)
 Full PFT completed today ? ?

## 2024-11-26 NOTE — Progress Notes (Signed)
 Full PFT completed today ? ?

## 2024-11-26 NOTE — Assessment & Plan Note (Signed)
 Exacerbated by CPAP. See above plan. Continue nasal sprays

## 2024-12-01 ENCOUNTER — Ambulatory Visit: Admitting: Clinical

## 2024-12-01 DIAGNOSIS — F331 Major depressive disorder, recurrent, moderate: Secondary | ICD-10-CM

## 2024-12-01 NOTE — Progress Notes (Signed)
 "  [facility] Counselor/Therapist Progress Note  Patient ID: Shannon [name], MRN: [id number],    Date: 12/01/2024  Time Spent: 8:33am - 9:32am : 59 minutes   Treatment Type: Individual Therapy  Reported Symptoms: decreased energy, decreased motivation, fatigue, wants to stay in bed  Mental Status Exam: Appearance:  Neat and Well Groomed     Behavior: Appropriate  Motor: Normal  Speech/Language:  Clear and Coherent and Normal Rate  Affect: Appropriate  Mood: Patient stated, I feel like my mood is better  Thought process: normal  Thought content:   WNL  Sensory/Perceptual disturbances:   WNL  Orientation: oriented to person, place, time/date, and situation  Attention: Good  Concentration: Good  Memory: WNL  Fund of knowledge:  Good  Insight:   Good  Judgment:  Good  Impulse Control: Good   Risk Assessment: Danger to Self:  No Patient denied current suicidal ideation  Self-injurious Behavior: No Danger to Others: No Patient denied current homicidal ideation Duty to Warn:no Physical Aggression / Violence:No  Access to Firearms a concern: No  Gang Involvement:No   Subjective: Patient stated, been going pretty good in response to events since last session. Patient stated, I'm back into not having much energy, struggling with that. Patient stated, I think it was the holidays, I had so much to do and it was pressuring me in reference to triggers for decrease in energy. Patient stated, the motivation I've got to do this in reference to holiday activities and increased energy during holidays. Patient reported concern related to patient's granddaughter's behaviors contributed to depressed mood. Patient reported decline in mood several days after Christmas. Patient stated, I think what bothered me so bad was everybody was so quiet and reserved, you could see anger, not making an effort to talk like a normal family in reference to family gathering during the  holidays. Patient reported the relationships between patient's children changed after patient's father passed away. Patient reported feeling patient and husband do not have opportunities to spend time together. Patient stated, where we're both engaged in response to patient's definition of quality time with husband. Patient stated, I feel like my mood is better since I talked to you in reference to current mood.    Interventions: Cognitive Behavioral Therapy. Clinician conducted session via caregility video from clinician's home office. Patient provided verbal consent to proceed with telehealth session and is aware of limitations of telephone or video visits. Patient participated in session from patient's home. Reviewed events since last session and assessed for changes. Explored and identified triggers for recent changes in energy and mood. Provided psycho education related to depression. Discussed family dynamics as it relates to patient's children's relationships. Explored patient's definition of quality time in regard to time with husband. Clinician requested for homework patient complete a thought record.     Collaboration of Care: not required at this time   Diagnosis:  Major depressive disorder, recurrent episode, moderate (HCC)     Plan: Patient is to utilize [employer] Therapy, thought re-framing, mindfulness and coping strategies to decrease symptoms associated with their diagnosis. Frequency: bi-weekly  Modality: individual      Long-term goal:   Reduce overall level, frequency, and intensity of the feelings of depression as evidenced by decrease in grief, loss of interest, depressed mood, isolation, increased sleep, low energy, psychomotor retardation, fatigue, feelings of guilt, lack of concentration, loss of motivation from 7 days/week to 0 to 1 days/week per patient report for at least 3  consecutive months. Target Date: 05/21/25  Progress: progressing    Short-term  goal:  Begin healthy grieving process as it relates to multiple losses patient has experienced (mother, father, sister, mother in law) Target Date: 05/21/25  Progress: progressing    Develop and implement effective communication strategies for patient to utilize when expressing her thoughts and feelings to others in a controlled and assertive way  Target Date: 05/21/25  Progress: progressing    Increase patient's participation in positive activities from 0 days per week to 3 per day week Target Date: 05/21/25  Progress: progressing    Develop and implement strategies to increase feelings of closeness in patient's relationship with husband Target Date: 05/21/25  Progress: progressing    Increase communication/contact with patient's children Target Date: 05/21/25  Progress: progressing     [name], LCSW    "

## 2024-12-01 NOTE — Progress Notes (Signed)
   [name], LCSW

## 2024-12-02 ENCOUNTER — Ambulatory Visit (INDEPENDENT_AMBULATORY_CARE_PROVIDER_SITE_OTHER): Admitting: Psychiatry

## 2024-12-02 ENCOUNTER — Encounter: Payer: Self-pay | Admitting: Psychiatry

## 2024-12-02 ENCOUNTER — Other Ambulatory Visit: Payer: Self-pay

## 2024-12-02 VITALS — BP 134/78 | HR 86 | Temp 97.7°F | Ht 64.0 in | Wt 173.3 lb

## 2024-12-02 DIAGNOSIS — Z1321 Encounter for screening for nutritional disorder: Secondary | ICD-10-CM | POA: Diagnosis not present

## 2024-12-02 DIAGNOSIS — F331 Major depressive disorder, recurrent, moderate: Secondary | ICD-10-CM | POA: Diagnosis not present

## 2024-12-02 NOTE — Patient Instructions (Signed)
 Decrease duloxetine  40 mg daily for one week, then 20 mg daily for one week, then discontinue  Start venlafaxine  37.,5 mg daily for two weeks, then 75 mg daily  Obtain labs (vitamin D , TSH) at [facility] Hold bupropion   Next appointment- 2/12 at 1 PM
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
## 2024-12-03 ENCOUNTER — Ambulatory Visit: Payer: Self-pay | Admitting: Psychiatry

## 2024-12-03 LAB — TSH: TSH: 1.3 u[IU]/mL (ref 0.450–4.500)

## 2024-12-03 LAB — VITAMIN D 25 HYDROXY (VIT D DEFICIENCY, FRACTURES): Vit D, 25-Hydroxy: 36.8 ng/mL (ref 30.0–100.0)

## 2024-12-04 ENCOUNTER — Encounter: Admitting: Family

## 2024-12-09 ENCOUNTER — Other Ambulatory Visit: Payer: Self-pay

## 2024-12-15 ENCOUNTER — Other Ambulatory Visit: Payer: Self-pay | Admitting: Psychiatry

## 2024-12-22 ENCOUNTER — Ambulatory Visit: Admitting: Clinical

## 2024-12-22 DIAGNOSIS — F331 Major depressive disorder, recurrent, moderate: Secondary | ICD-10-CM

## 2024-12-22 NOTE — Progress Notes (Unsigned)
 "  [facility] Counselor/Therapist Progress Note  Patient ID: Shannon [name], MRN: [id number],    Date: 12/22/2024  Time Spent: 9:35am - 10:24am : 49 minutes   Treatment Type: Individual Therapy  Reported Symptoms: decrease in energy, motivation, fatigue  Mental Status Exam: Appearance:  Neat and Well Groomed     Behavior: Appropriate  Motor: Normal  Speech/Language:  Clear and Coherent and Normal Rate  Affect: Flat  Mood: depressed  Thought process: normal  Thought content:   WNL  Sensory/Perceptual disturbances:   Patient reported experiencing pain  Orientation: oriented to person, place, time/date, and situation  Attention: Good  Concentration: Good  Memory: WNL  Fund of knowledge:  Good  Insight:   Good  Judgment:  Good  Impulse Control: Good   Risk Assessment: Danger to Self:  No Patient denied current suicidal ideation  Self-injurious Behavior: No Danger to Others: No Patient denied current homicidal ideation Duty to Warn:no Physical Aggression / Violence:No  Access to Firearms a concern: No  Gang Involvement:No   Subjective: Patient stated, fairly well in response to status of events since last session. Patient stated, I'm a little bit questionable about this new medication. Patient reported patient is no longer taking duloxetine  and has started a new medication for treatment of psoriatic arthritis and a new anti-depressant medication. Patient reported an increase in pain since changing medications. Patient reported patient will be scheduling shoulder replacement surgery and reported concerns related to surgery. Patient reported dreading surgery. Patient reported experiencing pain due to psoriatic arthritis and stated, I live in pain a lot and it really gets to me. Patient stated, emotional, I'm fairly well in response to mood since last session. Patient stated, today I'm a little down, I don't know why. Patient reported feeling frustrated  regarding concerns related to change in medications. Patient reported being in the house due to winter weather is a trigger for decline in mood today. Patient reported patient would like an increase in pain control and increase in energy. Patient stated, its just depressing in reference to pain and current symptoms. Patient stated, I want so much to have a better quality of life. Patient reported feeling patient's health and symptoms are barriers to quality of life and stated, I think it's getting me down. Patient reported patient did not complete thought record and stated, I've been kind of in a strange place in response to barriers to completing thought record. Patient stated, I'm at a loss of what I can do to make those changes be the best they can be in reference to patient's health.   Interventions: Cognitive Behavioral Therapy. Clinician conducted session via caregility video from clinician's home office. Patient provided verbal consent to proceed with telehealth session and is aware of limitations of telephone or video visits. Patient participated in session from patient's home. Reviewed events since last session and assessed for changes. Discussed patient's concerns regarding recent changes in medications and symptoms. Discussed patient following up with psychiatrist to review concerns regarding changes in medication. Discussed medical concerns and the impact on patient's activities and mood. Explored and identified triggers for current decline in mood. Reviewed thought record and discussed barriers to completing thought record. Clinician requested for homework patient complete a thought record and describe patient's view of quality of life.    Collaboration of Care: not required at this time   Diagnosis:  Major depressive disorder, recurrent episode, moderate (HCC)     Plan: Patient is to utilize [employer] [facility],  thought re-framing, mindfulness and coping strategies to  decrease symptoms associated with their diagnosis. Frequency: bi-weekly  Modality: individual      Long-term goal:   Reduce overall level, frequency, and intensity of the feelings of depression as evidenced by decrease in grief, loss of interest, depressed mood, isolation, increased sleep, low energy, psychomotor retardation, fatigue, feelings of guilt, lack of concentration, loss of motivation from 7 days/week to 0 to 1 days/week per patient report for at least 3 consecutive months. Target Date: 05/21/25  Progress: progressing    Short-term goal:  Begin healthy grieving process as it relates to multiple losses patient has experienced (mother, father, sister, mother in law) Target Date: 05/21/25  Progress: progressing    Develop and implement effective communication strategies for patient to utilize when expressing her thoughts and feelings to others in a controlled and assertive way  Target Date: 05/21/25  Progress: progressing    Increase patient's participation in positive activities from 0 days per week to 3 per day week Target Date: 05/21/25  Progress: progressing    Develop and implement strategies to increase feelings of closeness in patient's relationship with husband Target Date: 05/21/25  Progress: progressing    Increase communication/contact with patient's children Target Date: 05/21/25  Progress: progressing    [name], LCSW    "

## 2024-12-22 NOTE — Progress Notes (Unsigned)
   [name], LCSW

## 2024-12-23 ENCOUNTER — Encounter: Payer: Self-pay | Admitting: Family

## 2024-12-23 DIAGNOSIS — L4050 Arthropathic psoriasis, unspecified: Secondary | ICD-10-CM

## 2024-12-23 DIAGNOSIS — M899 Disorder of bone, unspecified: Secondary | ICD-10-CM

## 2024-12-23 DIAGNOSIS — G894 Chronic pain syndrome: Secondary | ICD-10-CM

## 2024-12-24 MED ORDER — HYDROCODONE-ACETAMINOPHEN 5-325 MG PO TABS: 1.0000 | ORAL_TABLET | ORAL | 0 refills | Status: AC | PRN

## 2024-12-26 ENCOUNTER — Telehealth: Payer: Self-pay

## 2024-12-26 ENCOUNTER — Telehealth (HOSPITAL_BASED_OUTPATIENT_CLINIC_OR_DEPARTMENT_OTHER): Payer: Self-pay | Admitting: *Deleted

## 2024-12-26 NOTE — Telephone Encounter (Signed)
" ° °  Name: Shannon [name]  DOB: Apr 18, 1956  MRN: [id number]  Primary Cardiologist: Lonni Hanson, MD   Preoperative team, please contact this patient and set up a phone call appointment for further preoperative risk assessment. Please obtain consent and complete medication review. Thank you for your help.  I confirm that guidance regarding antiplatelet and oral anticoagulation therapy has been completed and, if necessary, noted below.  I also confirmed the patient resides in the state of [place] . As per [facility] Medical Board telemedicine laws, the patient must reside in the state in which the provider is licensed.   Lamarr Satterfield, NP 12/26/2024, 4:19 PM [facility]    "

## 2024-12-26 NOTE — Telephone Encounter (Signed)
"  °  Patient Consent for Virtual Visit        Shannon [name] has provided verbal consent on 12/26/2024 for a virtual visit (video or telephone).   CONSENT FOR VIRTUAL VISIT FOR:  Shannon [name] [place]  By participating in this virtual visit I agree to the following:  I hereby voluntarily request, consent and authorize [facility] and its employed or contracted physicians, physician assistants, nurse practitioners or other licensed health care professionals (the Practitioner), to provide me with telemedicine health care services (the Services) as deemed necessary by the treating Practitioner. I acknowledge and consent to receive the Services by the Practitioner via telemedicine. I understand that the telemedicine visit will involve communicating with the Practitioner through live audiovisual communication technology and the disclosure of certain medical information by electronic transmission. I acknowledge that I have been given the opportunity to request an in-person assessment or other available alternative prior to the telemedicine visit and am voluntarily participating in the telemedicine visit.  I understand that I have the right to withhold or withdraw my consent to the use of telemedicine in the course of my care at any time, without affecting my right to future care or treatment, and that the Practitioner or I may terminate the telemedicine visit at any time. I understand that I have the right to inspect all information obtained and/or recorded in the course of the telemedicine visit and may receive copies of available information for a reasonable fee.  I understand that some of the potential risks of receiving the Services via telemedicine include:  Delay or interruption in medical evaluation due to technological equipment failure or disruption; Information transmitted may not be sufficient (e.g. poor resolution of images) to allow for appropriate medical decision making by the  Practitioner; and/or  In rare instances, security protocols could fail, causing a breach of personal health information.  Furthermore, I acknowledge that it is my responsibility to provide information about my medical history, conditions and care that is complete and accurate to the best of my ability. I acknowledge that Practitioner's advice, recommendations, and/or decision may be based on factors not within their control, such as incomplete or inaccurate data provided by me or distortions of diagnostic images or specimens that may result from electronic transmissions. I understand that the practice of medicine is not an exact science and that Practitioner makes no warranties or guarantees regarding treatment outcomes. I acknowledge that a copy of this consent can be made available to me via my patient portal [facility] MyChart), or I can request a printed copy by calling the office of [facility].    I understand that my insurance will be billed for this visit.   I have read or had this consent read to me. I understand the contents of this consent, which adequately explains the benefits and risks of the Services being provided via telemedicine.  I have been provided ample opportunity to ask questions regarding this consent and the Services and have had my questions answered to my satisfaction. I give my informed consent for the services to be provided through the use of telemedicine in my medical care    "

## 2024-12-26 NOTE — Telephone Encounter (Signed)
 Preop tele appt now scheduled, med rec and consent done.

## 2024-12-26 NOTE — Telephone Encounter (Signed)
"  ° °  Pre-operative Risk Assessment    Patient Name: Shannon [name]  DOB: Aug 01, 1956 MRN: [id number]   Date of last office visit: 09/23/24 SHERI HAMMOCK, NP Date of next office visit: NONE   Request for Surgical Clearance    Procedure:  RIGHT SHOULDER ARTHROSCOPY/PPP INJECTION  Date of Surgery:  Clearance 01/08/25                                Surgeon:  DR. LYNWOOD SICK Surgeon's Group or Practice Name:  [facility] Phone number:  [phone] Fax number:  [phone]   Type of Clearance Requested:   - Medical    Type of Anesthesia:  Local  AND SPINAL    Additional requests/questions:    Bonney Niels Jest   12/26/2024, 3:51 PM   "

## 2024-12-29 ENCOUNTER — Ambulatory Visit: Admitting: Clinical

## 2025-01-01 ENCOUNTER — Ambulatory Visit

## 2025-01-01 ENCOUNTER — Ambulatory Visit: Payer: Self-pay | Admitting: Psychiatry

## 2025-01-01 ENCOUNTER — Ambulatory Visit: Admitting: Psychiatry

## 2025-01-16 ENCOUNTER — Ambulatory Visit: Admitting: Clinical

## 2025-01-20 ENCOUNTER — Encounter: Payer: Self-pay | Admitting: Physician Assistant

## 2025-01-23 ENCOUNTER — Ambulatory Visit: Admitting: Clinical

## 2025-01-30 ENCOUNTER — Encounter: Payer: Self-pay | Admitting: Family Medicine

## 2025-03-18 ENCOUNTER — Ambulatory Visit: Admitting: Internal Medicine

## 2025-03-23 ENCOUNTER — Ambulatory Visit: Admitting: Cardiology

## 2025-06-02 ENCOUNTER — Encounter: Payer: Self-pay | Admitting: Family

## 2025-06-08 ENCOUNTER — Ambulatory Visit: Admitting: Dermatology
